# Patient Record
Sex: Female | Born: 1960
Health system: Southern US, Community
[De-identification: ages and names within clinical notes are randomized; demographics above are authoritative.]

## PROBLEM LIST (undated history)

## (undated) DIAGNOSIS — D649 Anemia, unspecified: Secondary | ICD-10-CM

## (undated) DIAGNOSIS — N183 Chronic kidney disease, stage 3 unspecified: Secondary | ICD-10-CM

## (undated) DIAGNOSIS — M169 Osteoarthritis of hip, unspecified: Secondary | ICD-10-CM

## (undated) DIAGNOSIS — D573 Sickle-cell trait: Secondary | ICD-10-CM

## (undated) DIAGNOSIS — D571 Sickle-cell disease without crisis: Secondary | ICD-10-CM

## (undated) DIAGNOSIS — I10 Essential (primary) hypertension: Secondary | ICD-10-CM

## (undated) DIAGNOSIS — E119 Type 2 diabetes mellitus without complications: Secondary | ICD-10-CM

## (undated) DIAGNOSIS — E785 Hyperlipidemia, unspecified: Secondary | ICD-10-CM

## (undated) DIAGNOSIS — Z973 Presence of spectacles and contact lenses: Secondary | ICD-10-CM

## (undated) DIAGNOSIS — N2889 Other specified disorders of kidney and ureter: Secondary | ICD-10-CM

## (undated) DIAGNOSIS — K219 Gastro-esophageal reflux disease without esophagitis: Secondary | ICD-10-CM

## (undated) DIAGNOSIS — E876 Hypokalemia: Secondary | ICD-10-CM

## (undated) DIAGNOSIS — G44209 Tension-type headache, unspecified, not intractable: Secondary | ICD-10-CM

## (undated) DIAGNOSIS — G4700 Insomnia, unspecified: Secondary | ICD-10-CM

## (undated) DIAGNOSIS — R202 Paresthesia of skin: Secondary | ICD-10-CM

## (undated) HISTORY — DX: Chronic kidney disease, stage 3 unspecified: N18.30

## (undated) HISTORY — DX: Hypokalemia: E87.6

## (undated) HISTORY — DX: Hypocalcemia: E83.51

## (undated) HISTORY — PX: CHOLECYSTECTOMY: SHX55

## (undated) HISTORY — DX: Tension-type headache, unspecified, not intractable: G44.209

## (undated) HISTORY — DX: Insomnia, unspecified: G47.00

## (undated) HISTORY — PX: FRACTURE SURGERY: SHX138

## (undated) HISTORY — DX: Sickle-cell trait: D57.3

## (undated) HISTORY — PX: COLONOSCOPY: SHX174

## (undated) HISTORY — PX: TUBAL LIGATION: SHX77

## (undated) HISTORY — DX: Hyperlipidemia, unspecified: E78.5

## (undated) HISTORY — DX: Paresthesia of skin: R20.2

## (undated) HISTORY — DX: Osteoarthritis of hip, unspecified: M16.9

## (undated) HISTORY — DX: Other specified disorders of kidney and ureter: N28.89

## (undated) HISTORY — DX: Chronic kidney disease, stage 3 (moderate): N18.3

## (undated) HISTORY — DX: Sickle-cell disease without crisis: D57.1

---

## 2005-07-05 ENCOUNTER — Emergency Department: Payer: Self-pay | Admitting: Internal Medicine

## 2006-02-15 ENCOUNTER — Emergency Department: Payer: Self-pay | Admitting: Emergency Medicine

## 2008-05-25 DIAGNOSIS — K219 Gastro-esophageal reflux disease without esophagitis: Secondary | ICD-10-CM | POA: Insufficient documentation

## 2008-06-05 ENCOUNTER — Ambulatory Visit: Payer: Self-pay | Admitting: Family Medicine

## 2009-03-12 ENCOUNTER — Emergency Department: Payer: Self-pay | Admitting: Emergency Medicine

## 2009-03-29 ENCOUNTER — Emergency Department: Payer: Self-pay | Admitting: Emergency Medicine

## 2010-05-09 ENCOUNTER — Ambulatory Visit: Payer: Self-pay | Admitting: Family Medicine

## 2010-05-15 ENCOUNTER — Ambulatory Visit: Payer: Self-pay | Admitting: Family Medicine

## 2010-05-19 ENCOUNTER — Ambulatory Visit: Payer: Self-pay | Admitting: Family Medicine

## 2010-05-26 ENCOUNTER — Ambulatory Visit: Payer: Self-pay | Admitting: Family Medicine

## 2010-05-27 DIAGNOSIS — E559 Vitamin D deficiency, unspecified: Secondary | ICD-10-CM | POA: Insufficient documentation

## 2010-05-27 DIAGNOSIS — E785 Hyperlipidemia, unspecified: Secondary | ICD-10-CM | POA: Insufficient documentation

## 2010-06-02 ENCOUNTER — Ambulatory Visit: Payer: Self-pay | Admitting: Family Medicine

## 2010-06-03 ENCOUNTER — Encounter: Payer: Self-pay | Admitting: Family Medicine

## 2010-06-27 ENCOUNTER — Encounter: Payer: Self-pay | Admitting: Family Medicine

## 2011-06-08 ENCOUNTER — Ambulatory Visit: Payer: Self-pay | Admitting: Family Medicine

## 2011-07-30 LAB — HM COLONOSCOPY

## 2011-11-09 ENCOUNTER — Ambulatory Visit: Payer: Self-pay | Admitting: Family Medicine

## 2012-01-12 ENCOUNTER — Emergency Department: Payer: Self-pay | Admitting: Emergency Medicine

## 2012-02-02 ENCOUNTER — Ambulatory Visit: Payer: Self-pay | Admitting: Family Medicine

## 2012-07-05 ENCOUNTER — Ambulatory Visit: Payer: Self-pay | Admitting: Family Medicine

## 2012-07-07 ENCOUNTER — Observation Stay: Payer: Self-pay | Admitting: Internal Medicine

## 2012-07-07 LAB — CBC
HCT: 35.3 % (ref 35.0–47.0)
HGB: 11.3 g/dL — ABNORMAL LOW (ref 12.0–16.0)
MCH: 26.3 pg (ref 26.0–34.0)
MCHC: 32.1 g/dL (ref 32.0–36.0)
MCV: 82 fL (ref 80–100)
Platelet: 178 10*3/uL (ref 150–440)
RBC: 4.32 10*6/uL (ref 3.80–5.20)
RDW: 14.3 % (ref 11.5–14.5)
WBC: 11.7 10*3/uL — ABNORMAL HIGH (ref 3.6–11.0)

## 2012-07-07 LAB — COMPREHENSIVE METABOLIC PANEL
Albumin: 3.8 g/dL (ref 3.4–5.0)
Alkaline Phosphatase: 114 U/L (ref 50–136)
Anion Gap: 8 (ref 7–16)
BUN: 17 mg/dL (ref 7–18)
Bilirubin,Total: 0.3 mg/dL (ref 0.2–1.0)
Calcium, Total: 8.9 mg/dL (ref 8.5–10.1)
Chloride: 108 mmol/L — ABNORMAL HIGH (ref 98–107)
Co2: 29 mmol/L (ref 21–32)
Creatinine: 1.08 mg/dL (ref 0.60–1.30)
EGFR (African American): >60
EGFR (Non-African Amer.): 59 — ABNORMAL LOW
Glucose: 105 mg/dL — ABNORMAL HIGH (ref 65–99)
Osmolality: 291 (ref 275–301)
Potassium: 4 mmol/L (ref 3.5–5.1)
SGOT(AST): 31 U/L (ref 15–37)
SGPT (ALT): 29 U/L
Sodium: 145 mmol/L (ref 136–145)
Total Protein: 8.2 g/dL (ref 6.4–8.2)

## 2012-07-07 LAB — CK TOTAL AND CKMB (NOT AT ARMC)
CK, Total: 224 U/L — ABNORMAL HIGH (ref 21–215)
CK-MB: 1.8 ng/mL (ref 0.5–3.6)

## 2012-07-07 LAB — URINALYSIS, COMPLETE
Bacteria: NONE SEEN
Bilirubin,UR: NEGATIVE
Blood: NEGATIVE
Glucose,UR: NEGATIVE mg/dL (ref 0–75)
Ketone: NEGATIVE
Leukocyte Esterase: NEGATIVE
Nitrite: NEGATIVE
Ph: 5 (ref 4.5–8.0)
Protein: NEGATIVE
RBC,UR: <1 /HPF (ref 0–5)
Specific Gravity: 1.011 (ref 1.003–1.030)
Squamous Epithelial: 1
WBC UR: 3 /HPF (ref 0–5)

## 2012-07-07 LAB — TROPONIN I: Troponin-I: <0.02 ng/mL

## 2012-07-08 DIAGNOSIS — R079 Chest pain, unspecified: Secondary | ICD-10-CM

## 2012-07-08 LAB — CBC WITH DIFFERENTIAL/PLATELET
Basophil #: 0.1 10*3/uL (ref 0.0–0.1)
Basophil %: 0.6 %
Eosinophil #: 0.3 10*3/uL (ref 0.0–0.7)
Eosinophil %: 3 %
HCT: 34.8 % — ABNORMAL LOW (ref 35.0–47.0)
HGB: 11.3 g/dL — ABNORMAL LOW (ref 12.0–16.0)
Lymphocyte #: 3 10*3/uL (ref 1.0–3.6)
Lymphocyte %: 32.4 %
MCH: 27 pg (ref 26.0–34.0)
MCHC: 32.5 g/dL (ref 32.0–36.0)
MCV: 83 fL (ref 80–100)
Monocyte #: 0.5 x10 3/mm (ref 0.2–0.9)
Monocyte %: 5.4 %
Neutrophil #: 5.5 10*3/uL (ref 1.4–6.5)
Neutrophil %: 58.6 %
Platelet: 166 10*3/uL (ref 150–440)
RBC: 4.19 10*6/uL (ref 3.80–5.20)
RDW: 14.4 % (ref 11.5–14.5)
WBC: 9.3 10*3/uL (ref 3.6–11.0)

## 2012-07-08 LAB — BASIC METABOLIC PANEL
Anion Gap: 7 (ref 7–16)
BUN: 14 mg/dL (ref 7–18)
Calcium, Total: 8.6 mg/dL (ref 8.5–10.1)
Chloride: 108 mmol/L — ABNORMAL HIGH (ref 98–107)
Co2: 29 mmol/L (ref 21–32)
Creatinine: 1.05 mg/dL (ref 0.60–1.30)
EGFR (African American): >60
EGFR (Non-African Amer.): >60
Glucose: 90 mg/dL (ref 65–99)
Osmolality: 287 (ref 275–301)
Potassium: 3.8 mmol/L (ref 3.5–5.1)
Sodium: 144 mmol/L (ref 136–145)

## 2012-07-08 LAB — LIPID PANEL
Cholesterol: 147 mg/dL (ref 0–200)
HDL Cholesterol: 46 mg/dL (ref 40–60)
Ldl Cholesterol, Calc: 75 mg/dL (ref 0–100)
Triglycerides: 129 mg/dL (ref 0–200)
VLDL Cholesterol, Calc: 26 mg/dL (ref 5–40)

## 2012-07-08 LAB — URINALYSIS, COMPLETE
Bacteria: NONE SEEN
Bilirubin,UR: NEGATIVE
Blood: NEGATIVE
Glucose,UR: NEGATIVE mg/dL (ref 0–75)
Ketone: NEGATIVE
Leukocyte Esterase: NEGATIVE
Nitrite: NEGATIVE
Ph: 7 (ref 4.5–8.0)
Protein: NEGATIVE
RBC,UR: NONE SEEN /HPF (ref 0–5)
Specific Gravity: 1.02 (ref 1.003–1.030)
Squamous Epithelial: NONE SEEN
WBC UR: 1 /HPF (ref 0–5)

## 2012-07-08 LAB — PROTIME-INR
INR: 1
Prothrombin Time: 13.3 secs (ref 11.5–14.7)

## 2012-07-08 LAB — HEMOGLOBIN A1C: Hemoglobin A1C: 6.1 % (ref 4.2–6.3)

## 2012-11-04 ENCOUNTER — Emergency Department: Payer: Self-pay | Admitting: Emergency Medicine

## 2013-07-26 LAB — HM PAP SMEAR

## 2013-08-09 ENCOUNTER — Ambulatory Visit: Payer: Self-pay | Admitting: Family Medicine

## 2014-06-03 ENCOUNTER — Emergency Department: Payer: Self-pay | Admitting: Emergency Medicine

## 2014-06-06 LAB — BETA STREP CULTURE(ARMC)

## 2014-07-28 ENCOUNTER — Emergency Department: Payer: Self-pay | Admitting: Emergency Medicine

## 2014-09-11 ENCOUNTER — Ambulatory Visit: Payer: Self-pay | Admitting: Family Medicine

## 2014-09-11 LAB — HM MAMMOGRAPHY: HM Mammogram: NORMAL

## 2014-11-05 ENCOUNTER — Ambulatory Visit: Payer: Self-pay | Admitting: Family Medicine

## 2014-12-13 ENCOUNTER — Ambulatory Visit: Payer: Self-pay | Admitting: Nurse Practitioner

## 2015-04-21 NOTE — H&P (Signed)
PATIENT NAME:  Brandy Johnston, Brandy Johnston MR#:  [id number] DATE OF BIRTH:  1961/10/18  DATE OF ADMISSION:  07/07/2012  ADMITTING PHYSICIAN: Dr. Enid Baasadhika Lakeishia Truluck. PRIMARY CARE PHYSICIAN: Dr. Alba CoryKrichna Sowles.   CHIEF COMPLAINT: Presyncope and also chest pain.   HISTORY OF PRESENT ILLNESS: Brandy Johnston is a 54 year old pleasant African American female with past medical history significant for diabetes, hypertension, and gastroesophageal reflux disease who presents to the Emergency Room secondary to sudden onset of chest tightness with diaphoresis and also presyncopal episode while at work today. The patient says she has not been sick lately. She was fine when she woke up this morning, went to work. She works at [employer] in [employer] and was kind of stressed out today because her two backup persons were on leave and she had to do most of the work. She was fine up until lunch break when she all of a sudden felt diaphoretic and had some chest tightness with breathing difficulty and also dizziness. She felt her voice was hoarse for a few minutes so had to call the on-call person who checked vitals and her blood pressure was 198/153. They asked her to lie down and rechecked vitals after three minutes and the blood pressure was still up. She was dropped home and then she called her PCP who asked her to come to the ED. In the Emergency Room the patient still has some chest pain. Blood pressure is 160/86 at this time. Her Johnston-dimer was elevated on labs at greater than 6 but CT of the chest is negative for PE. So she is being admitted under observation for her chest pain and presyncope with her risk factors.   PAST MEDICAL HISTORY:  1. Hypertension.  2. Diabetes mellitus.  3. Acid reflux disease. 4. Left leg neuropathy.   PAST SURGICAL HISTORY:  1. Left wrist surgery after a sports injury.  2. Cholecystectomy.  3. Tubal ligation.   ALLERGIES TO MEDICATIONS: No known drug allergies.   HOME MEDICATION: She  says she takes seven medications at home but does not know their names. By calling her pharmacy we were able to get only five medications.  1. Aspirin 81 mg p.o. daily.  2. Enalapril 20 mg p.o. daily. 3. Janumet 100 mg/1000 mg tablet, one tablet p.o. daily.  4. Meloxicam 15 mg p.o. as needed for pain.  5. Omeprazole 20 mg p.o. daily.  6. Apart from these, she states that she also takes one medication for cholesterol and also neuropathic medication, possibly gabapentin, but unknown doses.   SOCIAL HISTORY: Lives at home by herself. Works in [employer] as mentioned above. No history of any smoking. Very, very rare occasional alcohol use.   FAMILY HISTORY: Does not know anything about her dad. Aunt with breast cancer and mom with diabetes and hypertension.   REVIEW OF SYSTEMS: CONSTITUTIONAL: No fever, fatigue, or weakness. EYES: No blurred vision, double vision, glaucoma or cataracts. ENT: No tinnitus, ear pain, hearing loss, epistaxis, or discharge. RESPIRATORY: No cough, wheeze, hemoptysis, or chronic obstructive pulmonary disease. CARDIOVASCULAR: Positive for chest pain. No orthopnea, edema, arrhythmia, or palpitations. Positive for presyncope. GASTROINTESTINAL: No nausea, vomiting, diarrhea, abdominal pain, hematemesis, or melena. The patient actually does complain of occasional diarrhea secondary to metformin. GENITOURINARY: No dysuria, hematuria, renal calculus, frequency, or incontinence. ENDOCRINE: No polyuria, nocturia, thyroid problems, heat or cold intolerance. HEMATOLOGY: No anemia, easy bruising or bleeding. SKIN: No acne, rash, or lesions. MUSCULOSKELETAL: No neck, back, shoulder pain, arthritis, or gout. NEUROLOGIC:  No numbness, weakness, cerebrovascular accident, transient ischemic attack, or seizures. PSYCHOLOGICAL: No anxiety, insomnia, or depression.   PHYSICAL EXAMINATION:  VITAL SIGNS: Temperature 98.7 degrees Fahrenheit, pulse 89, respirations 18, blood pressure  160/86, pulse oximetry 97% on room air.   GENERAL: Well built, well nourished female lying in bed, not in any acute distress.   HEENT: Normocephalic, atraumatic. Pupils equal, round, reacting to light. Anicteric sclerae. Extraocular movements intact. Oropharynx clear without erythema, mass, or exudates.   NECK: Supple. No thyromegaly, jugular venous distention or carotid bruits. No lymphadenopathy.   LUNGS: Clear to auscultation bilaterally. No wheeze or crackles. No use of accessory muscles for breathing.   CARDIOVASCULAR: S1, S2. Regular rate and rhythm. No murmurs, rubs, or gallops.   ABDOMEN: Soft, nontender, nondistended. No hepatosplenomegaly. Normal bowel sounds.   EXTREMITIES: No pedal edema. No clubbing or cyanosis. 2+ dorsalis pulses palpable bilaterally.   SKIN: No acne, rash, or lesions.   LYMPHATICS: No cervical or inguinal lymphadenopathy.   NEUROLOGIC: Cranial nerves intact. No focal motor or sensory deficits.   PSYCHOLOGICAL: The patient is awake, alert, oriented x3.   LABORATORY, RADIOLOGICAL AND DIAGNOSTIC DATA: WBC 11.7, hemoglobin 11.3, hematocrit 35.2, platelet count 178. Sodium 145, potassium 4.0, chloride 108, bicarbonate 29, BUN 17, creatinine 1.08, glucose 105, calcium 8.9. ALT 29, AST 31, alkaline phosphatase 114, total bilirubin 0.3, albumin 3.8. First set of CK 224, CK MB 1.8 and troponin less than 0.02. Urinalysis negative for any infection. Johnston-dimer is greater than 6. Chest x-ray showing no acute cardiopulmonary abnormality and CT of the chest with contrast showing no evidence of any pulmonary embolus. Heart is normal. No pericardial effusion. The lungs are clear. No adenopathy. No consolidation, effusion, or pneumothorax. EKG showing normal sinus rhythm, heart rate of 77.   ASSESSMENT AND PLAN: This is a 54 year old female with diabetes, hypertension, and reflux disease admitted for presyncopal symptoms and chest pain. Johnston-dimer seems to be elevated, but CT of  chest is negative for PE.  1. Chest pain with presyncope. Blood pressure elevated when this happened, not sure if this is stress related or angina. However, she has a risk factor for unstable angina because of her medical problems. CT of the chest negative for PE so we will admit under observation. Recycle cardiac enzymes and Myoview in the morning. Continue aspirin at this time. Also, we will get lower extremity Doppler's to rule out deep venous thrombosis with her elevated Johnston-dimer. No other source could be identified at this time.  2. Accelerated hypertension. She is on the enalapril at home. Currently blood pressure better controlled, so continue that and add hydralazine IV p.r.n.  3. Diabetes mellitus. Hold metformin as she received IV contrast for CT of the chest, but continue Januvia and sliding scale insulin.  4. Gastroesophageal reflux disease. Continue Prilosec.  5. Neuropathic pain. The patient is on gabapentin and meloxicam with unknown doses so hold off for now. Continue Tylenol for pain if needed. 6. CODE STATUS: FULL CODE.   TIME SPENT ON ADMISSION: 50 minutes.   ____________________________ Enid Baas, MD rk:ap Johnston: 07/07/2012 22:44:35 ET T: 07/08/2012 06:53:29 ET JOB#: [id number]  cc: Enid Baas, MD, <Dictator> Onnie Boer. Carlynn Purl, MD Enid Baas MD ELECTRONICALLY SIGNED 07/08/2012 15:49

## 2015-04-21 NOTE — Discharge Summary (Signed)
PATIENT NAME:  Brandy Johnston, Brandy Johnston MR#:  [id number] DATE OF BIRTH:  12/22/1961  DATE OF ADMISSION:  07/07/2012 DATE OF DISCHARGE:  07/08/2012  ADMISSION DIAGNOSES:  1. Chest pain.  2. Hypertension.  3. History of diabetes.  4. Presyncope.   CONSULTS: None.   LABORATORY DATA: Troponin x3 were negative. Sodium 144, potassium 3.8, chloride 108, bicarb 29, BUN 14, creatinine 1.05, glucose 90, white blood cells 9.3, hemoglobin 11.3, hematocrit 35, platelets 166. INR 1.0.   Stress test was negative for acute ischemia.   HOSPITAL COURSE: The patient is a 54 year old female who was brought in for presyncope, atypical chest pain, and hypertension. For further details, please refer to the history and physical.  1. Chest pain. The patient underwent a Myoview which essentially was normal showing no evidence of ischemia. Her telemetry was normal. Cardiac enzymes were normal.  2. Hypertension. The patient still had some slightly elevated blood pressure. We think some of this was partly due to some anxiety. Her PCP recently increased her ACE inhibitor to 20 mg which she will continue and have follow-up with her outpatient physician regarding her blood pressure. We did ask her to go to the pharmacy and check her blood pressure daily until her follow-up appointment with her PCP.  3. Presyncope secondary to hypertension.  4. Gastroesophageal reflux disease. The patient will resume her PPI.  5. Diabetes. The patient will continue ADA diet and Januvia.   DISCHARGE MEDICATIONS:  1. Enalapril 20 mg daily.  2. Meloxicam 15 mg daily.  3. Januvia/metformin 100/1000 mg daily.  4. Omeprazole 20 mg daily.  5. Aspirin 81 mg daily.   DISCHARGE DIET: Low sodium carbohydrate controlled diet.   DISCHARGE ACTIVITY: As tolerated.   DISCHARGE FOLLOW-UP: The patient will follow-up with Dr. Carlynn PurlSowles in 1 to 2 days for her blood pressure.    TIME SPENT: Approximately 35 minutes.   ____________________________ Janyth ContesSital P. Juliene PinaMody,  MD spm:drc Johnston: 07/09/2012 11:50:00 ET T: 07/09/2012 12:00:41 ET JOB#: [id number]  cc: Donabelle Molden P. Juliene PinaMody, MD, <Dictator> Onnie BoerKrichna F. Carlynn PurlSowles, MD Janyth ContesSITAL P Jasmia Angst MD ELECTRONICALLY SIGNED 07/09/2012 12:15

## 2015-05-10 LAB — LIPID PANEL
Cholesterol: 138 mg/dL (ref 0–200)
HDL: 45 mg/dL (ref 35–70)
LDL Cholesterol: 73 mg/dL
Triglycerides: 101 mg/dL (ref 40–160)

## 2015-05-10 LAB — HEMOGLOBIN A1C: Hgb A1c MFr Bld: 6.7 % — AB (ref 4.0–6.0)

## 2015-05-27 ENCOUNTER — Other Ambulatory Visit: Payer: Self-pay

## 2015-05-27 ENCOUNTER — Encounter: Payer: Self-pay | Admitting: Emergency Medicine

## 2015-05-27 ENCOUNTER — Emergency Department
Admission: EM | Admit: 2015-05-27 | Discharge: 2015-05-27 | Disposition: A | Discharge status: Home / Self Care | Payer: BLUE CROSS/BLUE SHIELD | Attending: Emergency Medicine | Admitting: Emergency Medicine

## 2015-05-27 DIAGNOSIS — I10 Essential (primary) hypertension: Secondary | ICD-10-CM | POA: Insufficient documentation

## 2015-05-27 DIAGNOSIS — R55 Syncope and collapse: Secondary | ICD-10-CM | POA: Diagnosis not present

## 2015-05-27 DIAGNOSIS — E119 Type 2 diabetes mellitus without complications: Secondary | ICD-10-CM | POA: Diagnosis not present

## 2015-05-27 HISTORY — DX: Essential (primary) hypertension: I10

## 2015-05-27 HISTORY — DX: Type 2 diabetes mellitus without complications: E11.9

## 2015-05-27 LAB — URINALYSIS COMPLETE WITH MICROSCOPIC (ARMC ONLY)
Bilirubin Urine: NEGATIVE
Glucose, UA: NEGATIVE mg/dL
Hgb urine dipstick: NEGATIVE
Ketones, ur: NEGATIVE mg/dL
Leukocytes, UA: NEGATIVE
Nitrite: NEGATIVE
Protein, ur: NEGATIVE mg/dL
Specific Gravity, Urine: 1.013 (ref 1.005–1.030)
pH: 5 (ref 5.0–8.0)

## 2015-05-27 LAB — BASIC METABOLIC PANEL
Anion gap: 9 (ref 5–15)
BUN: 18 mg/dL (ref 6–20)
CO2: 26 mmol/L (ref 22–32)
Calcium: 8.9 mg/dL (ref 8.9–10.3)
Chloride: 106 mmol/L (ref 101–111)
Creatinine, Ser: 1.29 mg/dL — ABNORMAL HIGH (ref 0.44–1.00)
GFR calc Af Amer: 53 mL/min — ABNORMAL LOW (ref >60)
GFR calc non Af Amer: 46 mL/min — ABNORMAL LOW (ref >60)
Glucose, Bld: 102 mg/dL — ABNORMAL HIGH (ref 65–99)
Potassium: 3.4 mmol/L — ABNORMAL LOW (ref 3.5–5.1)
Sodium: 141 mmol/L (ref 135–145)

## 2015-05-27 LAB — CBC WITH DIFFERENTIAL/PLATELET
Basophils Absolute: 0.1 10*3/uL (ref 0–0.1)
Basophils Relative: 1 %
Eosinophils Absolute: 0.2 10*3/uL (ref 0–0.7)
Eosinophils Relative: 2 %
HCT: 33.3 % — ABNORMAL LOW (ref 35.0–47.0)
Hemoglobin: 10.9 g/dL — ABNORMAL LOW (ref 12.0–16.0)
Lymphocytes Relative: 28 %
Lymphs Abs: 2.7 10*3/uL (ref 1.0–3.6)
MCH: 26.6 pg (ref 26.0–34.0)
MCHC: 32.7 g/dL (ref 32.0–36.0)
MCV: 81.2 fL (ref 80.0–100.0)
Monocytes Absolute: 0.5 10*3/uL (ref 0.2–0.9)
Monocytes Relative: 5 %
Neutro Abs: 6.3 10*3/uL (ref 1.4–6.5)
Neutrophils Relative %: 64 %
Platelets: 226 10*3/uL (ref 150–440)
RBC: 4.1 MIL/uL (ref 3.80–5.20)
RDW: 15.1 % — ABNORMAL HIGH (ref 11.5–14.5)
WBC: 9.8 10*3/uL (ref 3.6–11.0)

## 2015-05-27 NOTE — ED Provider Notes (Signed)
[facility] Emergency Department Provider Note   ____________________________________________  Time seen: 2 PM I have reviewed the triage vital signs and the triage nursing note.  HISTORY  Chief Complaint Near Syncope   Historian  Patient   HPI Brandy Johnston is a 54 y.o. 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 It is somewhat exertional. She was feeling lightheaded and dizzy and then nearly passed out. She had no palpitations, shortness of breath, weakness or numbness, or headache. She has not been ill recently. She did land on her right side and feels a sore shoulder and sore wrist but does not believe them to be broken. She's never passed out before. She does follow with primary care doctor and has not seen a cardiologist in the past. She does report that in the past she's had some exertional chest pain but not today or with this episode.    Past Medical History  Diagnosis Date  . Hypertension   . Diabetes mellitus without complication     There are no active problems to display for this patient.   Past Surgical History  Procedure Laterality Date  . Cholecystectomy    . Tubal ligation      No current outpatient prescriptions on file. She does take a blood pressure pill  Allergies Review of patient's allergies indicates no known allergies.  History reviewed. No pertinent family history. social history: gay  Social History History  Substance Use Topics  . Smoking status: Never Smoker   . Smokeless tobacco: Not on file  . Alcohol Use: No    Review of Systems  Constitutional: Negative for fever. Eyes: Negative for visual changes. ENT: Negative for sore throat. Cardiovascular: Negative for palpitations Respiratory: Negative for shortness of breath. Gastrointestinal: Negative for abdominal pain, vomiting and diarrhea. Genitourinary: Negative for dysuria. Musculoskeletal: Negative for  back pain. Skin: Negative for rash. Neurological: Negative for headaches, focal weakness or numbness.  ____________________________________________   PHYSICAL EXAM:  VITAL SIGNS: ED Triage Vitals  Enc Vitals Group     BP 05/27/15 1113 136/70 mmHg     Pulse Rate 05/27/15 1113 80     Resp 05/27/15 1113 18     Temp 05/27/15 1113 98.7 F (37.1 C)     Temp Source 05/27/15 1113 Oral     SpO2 05/27/15 1113 98 %     Weight 05/27/15 1113 186 lb (84.369 kg)     Height 05/27/15 1113 5\' 4"  (1.626 m)     Head Cir --      Peak Flow --      Pain Score 05/27/15 1114 7     Pain Loc --      Pain Edu? --      Excl. in GC? --      Constitutional: Alert and oriented. Well appearing and in no distress. Eyes: Conjunctivae are normal. PERRL. Normal extraocular movements. ENT   Head: Normocephalic and atraumatic.   Nose: No congestion/rhinnorhea.   Mouth/Throat: Mucous membranes are moist.   Neck: No stridor. Cardiovascular: Normal rate, regular rhythm.  No murmurs, rubs, or gallops. Respiratory: Normal respiratory effort without tachypnea nor retractions. Breath sounds are clear and equal bilaterally. No wheezes/rales/rhonchi. Gastrointestinal: Soft and nontender. No distention.  Genitourinary: Musculoskeletal: Mildly tender anterior right shoulder margin with range of motion, but no bony point tenderness. No swelling about the elbow or wrist. Normal right elbow. Mild tenderness with range of motion of the  right wrist but no bony point tenderness. Neurologic:  Normal speech and language. No gross focal neurologic deficits are appreciated. Skin:  Skin is warm, dry and intact. No rash noted. Psychiatric: Mood and affect are normal. Speech and behavior are normal. Patient exhibits appropriate insight and judgment.  ____________________________________________   EKG  I, Governor Rooks, MD, the attending physician have personally viewed and interpreted this ECG.   79 bpm normal  sinus rhythm. Normal axis. Normal QRS. Nonspecific T-wave flattening laterally. QTC 424. No evidence of Wolff-Parkinson-White or Brugada ____________________________________________  LABS (pertinent positives/negatives)  White blood cell count normal, hemoglobin 10.9 Metabolic panel showing a BUN of 18 with a creatinine 1.29 and a potassium of 3.4 other elect lites within normal limits. Urinalysis negative ____________________________________________  RADIOLOGY Radiologist results reviewed  None __________________________________________  PROCEDURES  Procedure(s) performed: None Critical Care performed: None  ____________________________________________   ED COURSE / ASSESSMENT AND PLAN  Pertinent labs & imaging results that were available during my care of the patient were reviewed by me and considered in my medical decision making (see chart for details).   Patient is overall well-appearing with stable vital signs. No high-risk red flags on her history, physical exam or laboratory evaluation. Her BUN/creatinine are slightly elevated raising possibly of some level of dehydration. She is not having any IV fluids and I asked her to just hydrate tonight. No chance of pregnancy per patient with tubal and patient is gay. In terms of trauma, I do not suspect any broken bones in neither does the patient. It seemed to reason for imaging.   I suspect dehydration/vasovagal event causing her near syncopal episode. I do not suspect an acute cardiac cause nor a central brain related cause.  Due the patient's age and her complaint of exertional chest pains at times under review of systems, I did refer her to see a cardiologist. Return depressions and discharge instructions were provided to patient and she understands and is comfortable with this plan.   ___________________________________________   FINAL CLINICAL IMPRESSION(S) / ED DIAGNOSES   Final diagnoses:  Near syncope       Governor Rooks, MD 05/27/15 1423

## 2015-05-27 NOTE — ED Notes (Addendum)
Pt states that she got light-headed at work and blanked out. Pt is testing and playing on facebook during triage. She is complaining of right wrist and right knee pain. She drove herself here.

## 2015-05-27 NOTE — Discharge Instructions (Signed)
Near-Syncope Near-syncope (commonly known as near fainting) is sudden weakness, dizziness, or feeling like you might pass out. During an episode of near-syncope, you may also develop pale skin, have tunnel vision, or feel sick to your stomach (nauseous). Near-syncope may occur when getting up after sitting or while standing for a long time. It is caused by a sudden decrease in blood flow to the brain. This decrease can result from various causes or triggers, most of which are not serious. However, because near-syncope can sometimes be a sign of something serious, a medical evaluation is required. The specific cause is often not determined. HOME CARE INSTRUCTIONS  Monitor your condition for any changes. The following actions may help to alleviate any discomfort you are experiencing:  Have someone stay with you until you feel stable.  Lie down right away and prop your feet up if you start feeling like you might faint. Breathe deeply and steadily. Wait until all the symptoms have passed. Most of these episodes last only a few minutes. You may feel tired for several hours.   Drink enough fluids to keep your urine clear or pale yellow.   If you are taking blood pressure or heart medicine, get up slowly when seated or lying down. Take several minutes to sit and then stand. This can reduce dizziness.  Follow up with your health care provider as directed. SEEK IMMEDIATE MEDICAL CARE IF:   You have a severe headache.   You have unusual pain in the chest, abdomen, or back.   You are bleeding from the mouth or rectum, or you have black or tarry stool.   You have an irregular or very fast heartbeat.   You have repeated fainting or have seizure-like jerking during an episode.   You faint when sitting or lying down.   You have confusion.   You have difficulty walking.   You have severe weakness.   You have vision problems.  MAKE SURE YOU:   Understand these instructions.  Will  watch your condition.  Will get help right away if you are not doing well or get worse. Document Released: 12/14/2005 Document Revised: 12/19/2013 Document Reviewed: 05/19/2013 Exit[employer] Patient Information 2015 [employer]. This information is not intended to replace advice given to you by your health care provider. Make sure you discuss any questions you have with your health care provider.  

## 2015-06-14 ENCOUNTER — Telehealth: Payer: Self-pay | Admitting: Family Medicine

## 2015-06-14 NOTE — Telephone Encounter (Signed)
Pt says she called in to schedule an appointment for Monday or Tuesday for ER follow-up, but nothing is available. Is it possible to work her in sometime next week? Or what is the soonest we can work her in? She is only off on certain days. It is okay to leave a detailed message on her cell [phone]

## 2015-06-17 NOTE — Telephone Encounter (Signed)
Dr. Carlynn Purl approved 11:45 a.m. Tomorrow on 06/18/15

## 2015-06-17 NOTE — Telephone Encounter (Signed)
Where should she placed on the schedule for tomorrow?

## 2015-06-18 ENCOUNTER — Encounter: Payer: Self-pay | Admitting: Family Medicine

## 2015-06-18 ENCOUNTER — Ambulatory Visit (INDEPENDENT_AMBULATORY_CARE_PROVIDER_SITE_OTHER): Payer: BLUE CROSS/BLUE SHIELD | Admitting: Family Medicine

## 2015-06-18 VITALS — BP 116/66 | HR 72 | Temp 97.8°F | Resp 18 | Ht 63.75 in | Wt 182.7 lb

## 2015-06-18 DIAGNOSIS — E1129 Type 2 diabetes mellitus with other diabetic kidney complication: Secondary | ICD-10-CM | POA: Insufficient documentation

## 2015-06-18 DIAGNOSIS — E669 Obesity, unspecified: Secondary | ICD-10-CM | POA: Insufficient documentation

## 2015-06-18 DIAGNOSIS — I10 Essential (primary) hypertension: Secondary | ICD-10-CM

## 2015-06-18 DIAGNOSIS — N183 Chronic kidney disease, stage 3 unspecified: Secondary | ICD-10-CM | POA: Insufficient documentation

## 2015-06-18 DIAGNOSIS — D573 Sickle-cell trait: Secondary | ICD-10-CM | POA: Insufficient documentation

## 2015-06-18 DIAGNOSIS — M169 Osteoarthritis of hip, unspecified: Secondary | ICD-10-CM | POA: Insufficient documentation

## 2015-06-18 DIAGNOSIS — E66811 Obesity, class 1: Secondary | ICD-10-CM | POA: Insufficient documentation

## 2015-06-18 DIAGNOSIS — R252 Cramp and spasm: Secondary | ICD-10-CM | POA: Insufficient documentation

## 2015-06-18 DIAGNOSIS — R55 Syncope and collapse: Secondary | ICD-10-CM | POA: Diagnosis not present

## 2015-06-18 DIAGNOSIS — N1832 Chronic kidney disease, stage 3b: Secondary | ICD-10-CM | POA: Insufficient documentation

## 2015-06-18 DIAGNOSIS — R748 Abnormal levels of other serum enzymes: Secondary | ICD-10-CM | POA: Insufficient documentation

## 2015-06-18 DIAGNOSIS — M5442 Lumbago with sciatica, left side: Secondary | ICD-10-CM | POA: Insufficient documentation

## 2015-06-18 NOTE — Progress Notes (Signed)
Name: Brandy Johnston   MRN: [id number]    DOB: 07/06/1961   Date:06/18/2015       Progress Note  Subjective  Chief Complaint  Chief Complaint  Patient presents with  . Hospitalization Follow-up    Syncopy Episode on 05/27/2015 told the patient she was dehydrated, high BP and sugar dropped. Fell on left shoulder and wrist and had some swelling, but exam showed no broken bones. Blood work-normal.    HPI  Near Syncope episode: happened at work , she work in a [employer] but she was not in a hot environment at the time. She had breakfast the morning of the incident, but she had not drank enough fluids that day. She felt lightheaded , she stumbled and fell, she recalls the incident and caught herself with her right hand. She denies seizure activity , no bladder or bowel incontinence, no palpitation or chest pain.  Denies mental fogginess following the episode. She went by private vehicle to the [facility], labs were done, EKG and labs were within normal lipids, except for slightly low potassium at 3.4, and mild anemia - that is chronic.  She states her shoulder was sore but she is back to normal now. No other episodes since May 31st,2016  Patient Active Problem List   Diagnosis Date Noted  . Benign essential HTN 06/18/2015  . Chronic kidney disease (CKD), stage III (moderate) 06/18/2015  . Cramps of lower extremity 06/18/2015  . Diabetes 06/18/2015  . Abnormal serum level of alkaline phosphatase 06/18/2015  . Neuritis or radiculitis due to rupture of lumbar intervertebral disc 06/18/2015  . Adiposity 06/18/2015  . Degenerative arthritis of hip 06/18/2015  . Sickle cell trait 06/18/2015  . Dyslipidemia 05/27/2010  . Avitaminosis D 05/27/2010  . Gastro-esophageal reflux disease without esophagitis 05/25/2008    History  Substance Use Topics  . Smoking status: Never Smoker   . Smokeless tobacco: Never Used  . Alcohol Use: No     Current outpatient prescriptions:  .  amLODipine-valsartan (EXFORGE)  5-160 MG per tablet, Take 1 tablet by mouth daily., Disp: , Rfl: 0 .  aspirin 81 MG chewable tablet, Chew 1 tablet by mouth as needed., Disp: , Rfl:  .  atorvastatin (LIPITOR) 40 MG tablet, Take 1 tablet by mouth daily., Disp: , Rfl: 0 .  Butalbital-APAP-Caffeine 50-300-40 MG CAPS, Take 1 tablet by mouth as needed., Disp: , Rfl: 0 .  gabapentin (NEURONTIN) 300 MG capsule, Take 1 capsule by mouth 3 (three) times daily., Disp: , Rfl: 0 .  GLUCOSE BLOOD VI, , Disp: , Rfl:  .  magnesium oxide (MAG-OX) 400 MG tablet, Take 1 tablet by mouth 2 (two) times daily., Disp: , Rfl:  .  metFORMIN (GLUCOPHAGE) 850 MG tablet, Take 1 tablet by mouth daily., Disp: , Rfl: 0 .  omeprazole (PRILOSEC) 20 MG capsule, Take 1 capsule by mouth daily., Disp: , Rfl: 0 .  temazepam (RESTORIL) 15 MG capsule, Take 1 capsule by mouth as needed., Disp: , Rfl: 0 .  traMADol (ULTRAM) 50 MG tablet, Take 1 tablet by mouth as needed. For pain, Disp: , Rfl: 0 .  Vitamin D, Ergocalciferol, (DRISDOL) 50000 UNITS CAPS capsule, Take 1 capsule by mouth once a week., Disp: , Rfl: 0  No Known Allergies  ROS  Constitutional: Negative for fever or weight change.  Respiratory: Negative for cough and shortness of breath.   Cardiovascular: Negative for chest pain or palpitations.  Gastrointestinal: Negative for abdominal pain, no bowel changes.  Musculoskeletal:  Negative for gait problem or joint swelling.  Skin: Negative for rash.  Neurological: Negative for  headache. Dizziness resolved No other specific complaints in a complete review of systems (except as listed in HPI above).  Objective  Filed Vitals:   06/18/15 1223  BP: 116/66  Pulse: 72  Temp: 97.8 F (36.6 C)  TempSrc: Oral  Resp: 18  Height: 5' 3.75" (1.619 m)  Weight: 182 lb 11.2 oz (82.872 kg)  SpO2: 97%    Body mass index is 31.62 kg/(m^2).    Physical Exam  Constitutional: Patient appears well-developed and well-nourished. No distress.  Eyes:  No scleral  icterus.  Neck: Normal range of motion. Neck supple. Cardiovascular: Normal rate, regular rhythm and normal heart sounds.  No murmur heard. No BLE edema. Pulmonary/Chest: Effort normal and breath sounds normal. No respiratory distress. Abdominal: Soft.  There is no tenderness. Psychiatric: Patient has a normal mood and affect. behavior is normal. Judgment and thought content normal.  Recent Results (from the past 2160 hour(s))  Lipid panel     Status: None   Collection Time: 05/10/15 12:00 AM  Result Value Ref Range   Triglycerides 101 40 - 160 mg/dL   Cholesterol 138 0 - 200 mg/dL   HDL 45 35 - 70 mg/dL   LDL Cholesterol 73 mg/dL  Hemoglobin A1c     Status: Abnormal   Collection Time: 05/10/15 12:00 AM  Result Value Ref Range   Hgb A1c MFr Bld 6.7 (A) 4.0 - 6.0 %  CBC with Differential/Platelet     Status: Abnormal   Collection Time: 05/27/15 11:19 AM  Result Value Ref Range   WBC 9.8 3.6 - 11.0 K/uL   RBC 4.10 3.80 - 5.20 MIL/uL   Hemoglobin 10.9 (L) 12.0 - 16.0 g/dL   HCT 33.3 (L) 35.0 - 47.0 %   MCV 81.2 80.0 - 100.0 fL   MCH 26.6 26.0 - 34.0 pg   MCHC 32.7 32.0 - 36.0 g/dL   RDW 15.1 (H) 11.5 - 14.5 %   Platelets 226 150 - 440 K/uL   Neutrophils Relative % 64 %   Neutro Abs 6.3 1.4 - 6.5 K/uL   Lymphocytes Relative 28 %   Lymphs Abs 2.7 1.0 - 3.6 K/uL   Monocytes Relative 5 %   Monocytes Absolute 0.5 0.2 - 0.9 K/uL   Eosinophils Relative 2 %   Eosinophils Absolute 0.2 0 - 0.7 K/uL   Basophils Relative 1 %   Basophils Absolute 0.1 0 - 0.1 K/uL  Basic metabolic panel     Status: Abnormal   Collection Time: 05/27/15 11:19 AM  Result Value Ref Range   Sodium 141 135 - 145 mmol/L   Potassium 3.4 (L) 3.5 - 5.1 mmol/L   Chloride 106 101 - 111 mmol/L   CO2 26 22 - 32 mmol/L   Glucose, Bld 102 (H) 65 - 99 mg/dL   BUN 18 6 - 20 mg/dL   Creatinine, Ser 1.29 (H) 0.44 - 1.00 mg/dL   Calcium 8.9 8.9 - 10.3 mg/dL   GFR calc non Af Amer 46 (L) >60 mL/min   GFR calc Af Amer  53 (L) >60 mL/min    Comment: (NOTE) The eGFR has been calculated using the CKD EPI equation. This calculation has not been validated in all clinical situations. eGFR's persistently <60 mL/min signify possible Chronic Kidney Disease.    Anion gap 9 5 - 15  Urinalysis complete, with microscopic [facility])     Status: Abnormal  Collection Time: 05/27/15 11:19 AM  Result Value Ref Range   Color, Urine YELLOW (A) YELLOW   APPearance CLEAR (A) CLEAR   Glucose, UA NEGATIVE NEGATIVE mg/dL   Bilirubin Urine NEGATIVE NEGATIVE   Ketones, ur NEGATIVE NEGATIVE mg/dL   Specific Gravity, Urine 1.013 1.005 - 1.030   Hgb urine dipstick NEGATIVE NEGATIVE   pH 5.0 5.0 - 8.0   Protein, ur NEGATIVE NEGATIVE mg/dL   Nitrite NEGATIVE NEGATIVE   Leukocytes, UA NEGATIVE NEGATIVE   RBC / HPF 0-5 0 - 5 RBC/hpf   WBC, UA 0-5 0 - 5 WBC/hpf   Bacteria, UA RARE (A) NONE SEEN   Squamous Epithelial / LPF 0-5 (A) NONE SEEN   Hyaline Casts, UA PRESENT      Assessment & Plan  1. Near syncope Asymptomatic since initial event. Discussed possible causes, at this time we will not refer her for any further studies unless she has another episode. Stay hydrated and monitor bp at home if low notify me so we can adjust dose of medication.    2. Benign essential HTN Monitor bp at work, and if stays low she will call me back

## 2015-06-27 ENCOUNTER — Ambulatory Visit: Payer: Self-pay | Admitting: Family Medicine

## 2015-07-29 ENCOUNTER — Other Ambulatory Visit: Payer: Self-pay | Admitting: Family Medicine

## 2015-07-29 DIAGNOSIS — G44221 Chronic tension-type headache, intractable: Secondary | ICD-10-CM

## 2015-07-29 NOTE — Telephone Encounter (Signed)
Patient called stating she is at the pharmacy now and having a migraine and is asking we send her refill while she is waiting.

## 2015-07-29 NOTE — Telephone Encounter (Signed)
Patient requesting refill. 

## 2015-08-06 ENCOUNTER — Other Ambulatory Visit: Payer: Self-pay | Admitting: Family Medicine

## 2015-08-06 NOTE — Telephone Encounter (Signed)
Patient requesting refill. 

## 2015-08-16 ENCOUNTER — Ambulatory Visit (INDEPENDENT_AMBULATORY_CARE_PROVIDER_SITE_OTHER): Payer: BLUE CROSS/BLUE SHIELD | Admitting: Family Medicine

## 2015-08-16 ENCOUNTER — Encounter: Payer: Self-pay | Admitting: Family Medicine

## 2015-08-16 ENCOUNTER — Other Ambulatory Visit: Payer: Self-pay

## 2015-08-16 VITALS — BP 122/74 | HR 70 | Temp 98.4°F | Resp 14 | Ht 64.0 in | Wt 182.8 lb

## 2015-08-16 DIAGNOSIS — Z23 Encounter for immunization: Secondary | ICD-10-CM

## 2015-08-16 DIAGNOSIS — Z01419 Encounter for gynecological examination (general) (routine) without abnormal findings: Secondary | ICD-10-CM

## 2015-08-16 DIAGNOSIS — Z1239 Encounter for other screening for malignant neoplasm of breast: Secondary | ICD-10-CM

## 2015-08-16 DIAGNOSIS — Z1211 Encounter for screening for malignant neoplasm of colon: Secondary | ICD-10-CM

## 2015-08-16 DIAGNOSIS — Z124 Encounter for screening for malignant neoplasm of cervix: Secondary | ICD-10-CM | POA: Diagnosis not present

## 2015-08-16 DIAGNOSIS — Z7189 Other specified counseling: Secondary | ICD-10-CM | POA: Diagnosis not present

## 2015-08-16 DIAGNOSIS — Z0000 Encounter for general adult medical examination without abnormal findings: Secondary | ICD-10-CM

## 2015-08-16 DIAGNOSIS — G44221 Chronic tension-type headache, intractable: Secondary | ICD-10-CM

## 2015-08-16 DIAGNOSIS — Z719 Counseling, unspecified: Secondary | ICD-10-CM

## 2015-08-16 NOTE — Progress Notes (Signed)
Name: Brandy Johnston   MRN: [id number]    DOB: Jan 15, 1961   Date:08/16/2015       Progress Note  Subjective  Chief Complaint  Chief Complaint  Patient presents with  . Annual Exam    HPI  Well woman: continues to have body aches from a physical job, also has meralgia paresthetica - chronic, and radiculitis but is not taking gabapentin three times daily.    Patient Active Problem List   Diagnosis Date Noted  . Benign essential HTN 06/18/2015  . Chronic kidney disease (CKD), stage III (moderate) 06/18/2015  . Cramps of lower extremity 06/18/2015  . Diabetes mellitus with renal manifestation 06/18/2015  . Abnormal serum level of alkaline phosphatase 06/18/2015  . Neuritis or radiculitis due to rupture of lumbar intervertebral disc 06/18/2015  . Obesity (BMI 30.0-34.9) 06/18/2015  . Degenerative arthritis of hip 06/18/2015  . Sickle cell trait 06/18/2015  . Dyslipidemia 05/27/2010  . Vitamin D deficiency 05/27/2010  . Gastro-esophageal reflux disease without esophagitis 05/25/2008    Past Surgical History  Procedure Laterality Date  . Cholecystectomy    . Tubal ligation    . Fracture surgery Left     cast and pins   . Colonoscopy      Family History  Problem Relation Age of Onset  . Migraines Mother   . Diabetes Mother   . Cancer Mother     Breast  . Arthritis Brother   . Cancer Maternal Aunt     Breast  . Cancer Maternal Uncle     Lung and Colon  . Cirrhosis Brother     Social History   Social History  . Marital Status: Single    Spouse Name: N/A  . Number of Children: N/A  . Years of Education: N/A   Occupational History  . Not on file.   Social History Main Topics  . Smoking status: Never Smoker   . Smokeless tobacco: Never Used  . Alcohol Use: No  . Drug Use: No  . Sexual Activity: Yes   Other Topics Concern  . Not on file   Social History Narrative     Current outpatient prescriptions:  .  amLODipine-valsartan (EXFORGE) 5-160 MG per  tablet, Take 1 tablet by mouth daily., Disp: , Rfl: 0 .  aspirin 81 MG chewable tablet, Chew 1 tablet by mouth as needed., Disp: , Rfl:  .  atorvastatin (LIPITOR) 40 MG tablet, Take 1 tablet by mouth daily., Disp: , Rfl: 0 .  Butalbital-APAP-Caffeine 50-300-40 MG CAPS, take 1 capsule by mouth every 6 hours if needed for headache, Disp: 30 capsule, Rfl: 0 .  gabapentin (NEURONTIN) 300 MG capsule, Take 1 capsule by mouth 3 (three) times daily., Disp: , Rfl: 0 .  GLUCOSE BLOOD VI, , Disp: , Rfl:  .  magnesium oxide (MAG-OX) 400 MG tablet, Take 1 tablet by mouth 2 (two) times daily., Disp: , Rfl:  .  metFORMIN (GLUCOPHAGE) 850 MG tablet, Take 1 tablet by mouth daily., Disp: , Rfl: 0 .  omeprazole (PRILOSEC) 20 MG capsule, take 1 capsule by mouth every morning, Disp: 30 capsule, Rfl: 6 .  temazepam (RESTORIL) 15 MG capsule, Take 1 capsule by mouth as needed., Disp: , Rfl: 0 .  traMADol (ULTRAM) 50 MG tablet, Take 1 tablet by mouth as needed. For pain, Disp: , Rfl: 0 .  Vitamin D, Ergocalciferol, (DRISDOL) 50000 UNITS CAPS capsule, Take 1 capsule by mouth once a week., Disp: , Rfl: 0  No Known  Allergies   ROS  Constitutional: Negative for fever or weight change.  Respiratory: Negative for cough and shortness of breath.   Cardiovascular: Negative for chest pain or palpitations.  Gastrointestinal: Negative for abdominal pain, no bowel changes.  Musculoskeletal: Negative for gait problem or joint swelling.  Skin: Negative for rash.  Neurological: Negative for dizziness or headache.  No other specific complaints in a complete review of systems (except as listed in HPI above).  Objective  Filed Vitals:   08/16/15 1120  BP: 122/74  Pulse: 70  Temp: 98.4 F (36.9 C)  TempSrc: Oral  Resp: 14  Height: 5' 4"  (1.626 m)  Weight: 182 lb 12.8 oz (82.918 kg)  SpO2: 95%    Body mass index is 31.36 kg/(m^2).  Physical Exam  Constitutional: Patient appears well-developed and well-nourished. No  distress.  HENT: Head: Normocephalic and atraumatic. Ears: B TMs ok, no erythema or effusion; Nose: Nose normal. Mouth/Throat: Oropharynx is clear and moist. No oropharyngeal exudate.  Eyes: Conjunctivae and EOM are normal. Pupils are equal, round, and reactive to light. No scleral icterus.  Neck: Normal range of motion. Neck supple. No JVD present. No thyromegaly present.  Cardiovascular: Normal rate, regular rhythm and normal heart sounds.  No murmur heard. No BLE edema. Pulmonary/Chest: Effort normal and breath sounds normal. No respiratory distress. Abdominal: Soft. Bowel sounds are normal, no distension. There is no tenderness. no masses Breast: no lumps or masses, no nipple discharge or rashes FEMALE GENITALIA:  External genitalia normal External urethra normal Vaginal vault normal without discharge or lesions Cervix normal without discharge or lesions Bimanual exam normal without masses RECTAL: no rectal masses or hemorrhoids Musculoskeletal: Normal range of motion, no joint effusions. No gross deformities Neurological: he is alert and oriented to person, place, and time. No cranial nerve deficit. Coordination, balance, strength, speech and gait are normal. Paresthesia left lateral thigh Skin: Skin is warm and dry. No rash noted. No erythema.  Psychiatric: Patient has a normal mood and affect. behavior is normal. Judgment and thought content normal.  Recent Results (from the past 2160 hour(s))  CBC with Differential/Platelet     Status: Abnormal   Collection Time: 05/27/15 11:19 AM  Result Value Ref Range   WBC 9.8 3.6 - 11.0 K/uL   RBC 4.10 3.80 - 5.20 MIL/uL   Hemoglobin 10.9 (L) 12.0 - 16.0 g/dL   HCT 33.3 (L) 35.0 - 47.0 %   MCV 81.2 80.0 - 100.0 fL   MCH 26.6 26.0 - 34.0 pg   MCHC 32.7 32.0 - 36.0 g/dL   RDW 15.1 (H) 11.5 - 14.5 %   Platelets 226 150 - 440 K/uL   Neutrophils Relative % 64 %   Neutro Abs 6.3 1.4 - 6.5 K/uL   Lymphocytes Relative 28 %   Lymphs Abs 2.7  1.0 - 3.6 K/uL   Monocytes Relative 5 %   Monocytes Absolute 0.5 0.2 - 0.9 K/uL   Eosinophils Relative 2 %   Eosinophils Absolute 0.2 0 - 0.7 K/uL   Basophils Relative 1 %   Basophils Absolute 0.1 0 - 0.1 K/uL  Basic metabolic panel     Status: Abnormal   Collection Time: 05/27/15 11:19 AM  Result Value Ref Range   Sodium 141 135 - 145 mmol/L   Potassium 3.4 (L) 3.5 - 5.1 mmol/L   Chloride 106 101 - 111 mmol/L   CO2 26 22 - 32 mmol/L   Glucose, Bld 102 (H) 65 - 99 mg/dL  BUN 18 6 - 20 mg/dL   Creatinine, Ser 1.29 (H) 0.44 - 1.00 mg/dL   Calcium 8.9 8.9 - 10.3 mg/dL   GFR calc non Af Amer 46 (L) >60 mL/min   GFR calc Af Amer 53 (L) >60 mL/min    Comment: (NOTE) The eGFR has been calculated using the CKD EPI equation. This calculation has not been validated in all clinical situations. eGFR's persistently <60 mL/min signify possible Chronic Kidney Disease.    Anion gap 9 5 - 15  Urinalysis complete, with microscopic [facility])     Status: Abnormal   Collection Time: 05/27/15 11:19 AM  Result Value Ref Range   Color, Urine YELLOW (A) YELLOW   APPearance CLEAR (A) CLEAR   Glucose, UA NEGATIVE NEGATIVE mg/dL   Bilirubin Urine NEGATIVE NEGATIVE   Ketones, ur NEGATIVE NEGATIVE mg/dL   Specific Gravity, Urine 1.013 1.005 - 1.030   Hgb urine dipstick NEGATIVE NEGATIVE   pH 5.0 5.0 - 8.0   Protein, ur NEGATIVE NEGATIVE mg/dL   Nitrite NEGATIVE NEGATIVE   Leukocytes, UA NEGATIVE NEGATIVE   RBC / HPF 0-5 0 - 5 RBC/hpf   WBC, UA 0-5 0 - 5 WBC/hpf   Bacteria, UA RARE (A) NONE SEEN   Squamous Epithelial / LPF 0-5 (A) NONE SEEN   Hyaline Casts, UA PRESENT      PHQ2/9: Depression screen [facility] 2/9 06/18/2015  Decreased Interest 0  Down, Depressed, Hopeless 0  PHQ - 2 Score 0     Fall Risk: Fall Risk  06/18/2015  Falls in the past year? No      Assessment & Plan  1. Well woman exam   2. Cervical cancer screening  - Pap IG, CT/NG NAA, and HPV (high risk)  3. Breast cancer  screening  - MM Digital Screening; Future  4. Colon cancer screening Up to date  40. Health counseling Discussed importance of 150 minutes of physical activity weekly, eat two servings of fish weekly, eat one serving of tree nuts ( cashews, pistachios, pecans, almonds.Marland Kitchen) every other day, eat 6 servings of fruit/vegetables daily and drink plenty of water and avoid sweet beverages.   6. Needs flu shot  - Flu Vaccine QUAD 36+ mos IM

## 2015-08-16 NOTE — Telephone Encounter (Signed)
Forgot to ask for refill when u saw  her

## 2015-08-17 MED ORDER — BUTALBITAL-APAP-CAFFEINE 50-300-40 MG PO CAPS: 1.0000 | ORAL_CAPSULE | Freq: Four times a day (QID) | ORAL | Status: DC | PRN

## 2015-08-22 LAB — PAP IG, CT-NG NAA, HPV HIGH-RISK: PAP Smear Comment: 0

## 2015-08-22 NOTE — Progress Notes (Signed)
Left patient voicemail.

## 2015-09-02 ENCOUNTER — Encounter: Payer: Self-pay | Admitting: Medical Oncology

## 2015-09-02 ENCOUNTER — Emergency Department
Admission: EM | Admit: 2015-09-02 | Discharge: 2015-09-02 | Disposition: A | Discharge status: Home / Self Care | Payer: BLUE CROSS/BLUE SHIELD | Attending: Emergency Medicine | Admitting: Emergency Medicine

## 2015-09-02 DIAGNOSIS — X58XXXA Exposure to other specified factors, initial encounter: Secondary | ICD-10-CM | POA: Insufficient documentation

## 2015-09-02 DIAGNOSIS — Z7982 Long term (current) use of aspirin: Secondary | ICD-10-CM | POA: Insufficient documentation

## 2015-09-02 DIAGNOSIS — N183 Chronic kidney disease, stage 3 (moderate): Secondary | ICD-10-CM | POA: Insufficient documentation

## 2015-09-02 DIAGNOSIS — Z79899 Other long term (current) drug therapy: Secondary | ICD-10-CM | POA: Insufficient documentation

## 2015-09-02 DIAGNOSIS — S39012A Strain of muscle, fascia and tendon of lower back, initial encounter: Secondary | ICD-10-CM | POA: Diagnosis not present

## 2015-09-02 DIAGNOSIS — E119 Type 2 diabetes mellitus without complications: Secondary | ICD-10-CM | POA: Insufficient documentation

## 2015-09-02 DIAGNOSIS — Y999 Unspecified external cause status: Secondary | ICD-10-CM | POA: Insufficient documentation

## 2015-09-02 DIAGNOSIS — Y939 Activity, unspecified: Secondary | ICD-10-CM | POA: Insufficient documentation

## 2015-09-02 DIAGNOSIS — M545 Low back pain: Secondary | ICD-10-CM | POA: Diagnosis present

## 2015-09-02 DIAGNOSIS — I129 Hypertensive chronic kidney disease with stage 1 through stage 4 chronic kidney disease, or unspecified chronic kidney disease: Secondary | ICD-10-CM | POA: Diagnosis not present

## 2015-09-02 DIAGNOSIS — Y929 Unspecified place or not applicable: Secondary | ICD-10-CM | POA: Insufficient documentation

## 2015-09-02 LAB — GLUCOSE, CAPILLARY: Glucose-Capillary: 102 mg/dL — ABNORMAL HIGH (ref 65–99)

## 2015-09-02 LAB — URINALYSIS COMPLETE WITH MICROSCOPIC (ARMC ONLY)
Bilirubin Urine: NEGATIVE
Glucose, UA: NEGATIVE mg/dL
Hgb urine dipstick: NEGATIVE
Ketones, ur: NEGATIVE mg/dL
Leukocytes, UA: NEGATIVE
Nitrite: NEGATIVE
Protein, ur: NEGATIVE mg/dL
Specific Gravity, Urine: 1.01 (ref 1.005–1.030)
pH: 6 (ref 5.0–8.0)

## 2015-09-02 MED ORDER — CYCLOBENZAPRINE HCL 10 MG PO TABS
5.0000 mg | ORAL_TABLET | Freq: Once | ORAL | Status: AC
Start: 2015-09-02 — End: 2015-09-02
  Administered 2015-09-02: 5 mg via ORAL
  Filled 2015-09-02: qty 1

## 2015-09-02 MED ORDER — CYCLOBENZAPRINE HCL 5 MG PO TABS: 5.0000 mg | ORAL_TABLET | Freq: Three times a day (TID) | ORAL | Status: DC | PRN

## 2015-09-02 NOTE — ED Provider Notes (Addendum)
[facility]  Time seen: 5:42 PM  I have reviewed the triage vital signs and the nursing notes.   HISTORY  Chief Complaint Back Pain    HPI Brandy Johnston is a 54 y.o. female with a past medical history of hypertension, diabetes, hyperlipidemia, sickle cell who presents the emergency department with lower back pain. According to the patient for the past 3-4 days she has had lower back pain radiating down both of her legs. Denies any weakness or numbness. Denies any incontinence. Denies any fever. Patient does not recall an inciting event. She states it feels like her lower back is "spasming."    Past Medical History  Diagnosis Date  . Hypertension   . Diabetes mellitus without complication   . Hyperlipidemia   . Sickle-cell trait   . Paresthesia   . Sickle cell anemia   . Tension headache   . Hypokalemia   . Low calcium levels   . Chronic renal impairment, stage 3 (moderate)   . Insomnia   . Osteoarthrosis, hip     right hip    Patient Active Problem List   Diagnosis Date Noted  . Benign essential HTN 06/18/2015  . Chronic kidney disease (CKD), stage III (moderate) 06/18/2015  . Cramps of lower extremity 06/18/2015  . Diabetes mellitus with renal manifestation 06/18/2015  . Abnormal serum level of alkaline phosphatase 06/18/2015  . Neuritis or radiculitis due to rupture of lumbar intervertebral disc 06/18/2015  . Obesity (BMI 30.0-34.9) 06/18/2015  . Degenerative arthritis of hip 06/18/2015  . Sickle cell trait 06/18/2015  . Dyslipidemia 05/27/2010  . Vitamin D deficiency 05/27/2010  . Gastro-esophageal reflux disease without esophagitis 05/25/2008    Past Surgical History  Procedure Laterality Date  . Cholecystectomy    . Tubal ligation    . Fracture surgery Left     cast and pins   . Colonoscopy      Current Outpatient Rx  Name  Route  Sig  Dispense  Refill  . amLODipine-valsartan (EXFORGE) 5-160  MG per tablet   Oral   Take 1 tablet by mouth daily.      0   . aspirin 81 MG chewable tablet   Oral   Chew 1 tablet by mouth as needed.         Marland Kitchen atorvastatin (LIPITOR) 40 MG tablet   Oral   Take 1 tablet by mouth daily.      0   . Butalbital-APAP-Caffeine 50-300-40 MG CAPS   Oral   Take 1 capsule by mouth every 6 (six) hours as needed.   30 capsule   0   . gabapentin (NEURONTIN) 300 MG capsule   Oral   Take 1 capsule by mouth 3 (three) times daily.      0   . GLUCOSE BLOOD VI               . magnesium oxide (MAG-OX) 400 MG tablet   Oral   Take 1 tablet by mouth 2 (two) times daily.         . metFORMIN (GLUCOPHAGE) 850 MG tablet   Oral   Take 1 tablet by mouth daily.      0   . omeprazole (PRILOSEC) 20 MG capsule      take 1 capsule by mouth every morning   30 capsule   6     DX: 530.81   . temazepam (RESTORIL) 15 MG capsule   Oral  Take 1 capsule by mouth as needed.      0   . traMADol (ULTRAM) 50 MG tablet   Oral   Take 1 tablet by mouth as needed. For pain      0   . Vitamin D, Ergocalciferol, (DRISDOL) 50000 UNITS CAPS capsule   Oral   Take 1 capsule by mouth once a week.      0     Allergies Review of patient's allergies indicates no known allergies.  Family History  Problem Relation Age of Onset  . Migraines Mother   . Diabetes Mother   . Cancer Mother     Breast  . Arthritis Brother   . Cancer Maternal Aunt     Breast  . Cancer Maternal Uncle     Lung and Colon  . Cirrhosis Brother     Social History Social History  Substance Use Topics  . Smoking status: Never Smoker   . Smokeless tobacco: Never Used  . Alcohol Use: No    Review of Systems Constitutional: Negative for fever. Cardiovascular: Negative for chest pain. Respiratory: Negative for shortness of breath. Gastrointestinal: Negative for abdominal pain Genitourinary: Negative for dysuria. Dark urine at times. Musculoskeletal: Positive for lower  back pain. Skin: Negative for rash. 10-point ROS otherwise negative.  ____________________________________________   PHYSICAL EXAM:  VITAL SIGNS: ED Triage Vitals  Enc Vitals Group     BP 09/02/15 1653 145/75 mmHg     Pulse Rate 09/02/15 1653 79     Resp 09/02/15 1653 18     Temp 09/02/15 1653 99.3 F (37.4 C)     Temp Source 09/02/15 1653 Oral     SpO2 09/02/15 1653 98 %     Weight 09/02/15 1653 180 lb (81.647 kg)     Height 09/02/15 1653 5\' 3"  (1.6 m)     Head Cir --      Peak Flow --      Pain Score 09/02/15 1653 8     Pain Loc --      Pain Edu? --      Excl. in GC? --     Constitutional: Alert and oriented. Well appearing and in no distress. Eyes: Normal exam ENT   Mouth/Throat: Mucous membranes are moist. Cardiovascular: Normal rate, regular rhythm. No murmur Respiratory: Normal respiratory effort without tachypnea nor retractions. Breath sounds are clear and equal bilaterally. No wheezes/rales/rhonchi. Gastrointestinal: Soft and nontender. No distention.   Musculoskeletal: Moderate tenderness to palpation of the lower back especially off to the right side. No CVA tenderness palpation. Neurologic:  Normal speech and language. No gross focal neurologic deficits Skin:  Skin is warm, dry and intact.  Psychiatric: Mood and affect are normal. Speech and behavior are normal.  ____________________________________________     INITIAL IMPRESSION / ASSESSMENT AND PLAN / ED COURSE  Pertinent labs & imaging results that were available during my care of the patient were reviewed by me and considered in my medical decision making (see chart for details).  Patient with lower back pain, radiating to both of her legs. Most consistent with lumbar strain with radicular pain. We will place the patient on Flexeril, ibuprofen, have her follow up with her primary care doctor for further evaluation. Patient also notes as a secondary complaint intermittent dark urine, we will check  urinalysis.  Urinalysis is negative we will discharge patient.  ____________________________________________   FINAL CLINICAL IMPRESSION(S) / ED DIAGNOSES  Lumbar strain/low back pain   Minna Antis, MD 09/02/15  1745  Minna Antis, MD 09/02/15 [id number]

## 2015-09-02 NOTE — ED Notes (Signed)
C/O back pain since Thursday no injury if note.  Pt amb to room w/o difficulty.

## 2015-09-02 NOTE — Discharge Instructions (Signed)
Back Pain, Adult °Back pain is very common. The pain often gets better over time. The cause of back pain is usually not dangerous. Most people can learn to manage their back pain on their own.  °HOME CARE  °· Stay active. Start with short walks on flat ground if you can. Try to walk farther each day. °· Do not sit, drive, or stand in one place for more than 30 minutes. Do not stay in bed. °· Do not avoid exercise or work. Activity can help your back heal faster. °· Be careful when you bend or lift an object. Bend at your knees, keep the object close to you, and do not twist. °· Sleep on a firm mattress. Lie on your side, and bend your knees. If you lie on your back, put a pillow under your knees. °· Only take medicines as told by your doctor. °· Put ice on the injured area. °¨ Put ice in a plastic bag. °¨ Place a towel between your skin and the bag. °¨ Leave the ice on for 15-20 minutes, 03-04 times a day for the first 2 to 3 days. After that, you can switch between ice and heat packs. °· Ask your doctor about back exercises or massage. °· Avoid feeling anxious or stressed. Find good ways to deal with stress, such as exercise. °GET HELP RIGHT AWAY IF:  °· Your pain does not go away with rest or medicine. °· Your pain does not go away in 1 week. °· You have new problems. °· You do not feel well. °· The pain spreads into your legs. °· You cannot control when you poop (bowel movement) or pee (urinate). °· Your arms or legs feel weak or lose feeling (numbness). °· You feel sick to your stomach (nauseous) or throw up (vomit). °· You have belly (abdominal) pain. °· You feel like you may pass out (faint). °MAKE SURE YOU:  °· Understand these instructions. °· Will watch your condition. °· Will get help right away if you are not doing well or get worse. °Document Released: 06/01/2008 Document Revised: 03/07/2012 Document Reviewed: 04/17/2014 °[employer]® Patient Information ©2015 [employer]. This information is not intended  to replace advice given to you by your health care provider. Make sure you discuss any questions you have with your health care provider. ° °

## 2015-09-02 NOTE — ED Notes (Signed)
Pt ambulatory to triage with reports that she has been having lower back pain that radiates down both legs since Thursday without injury. Pt also reports that she has been checking her blood sugars at home and they have been elevated- as high as 300. Pt states that she takes metformin and has been taking as prescribed.

## 2015-09-03 ENCOUNTER — Other Ambulatory Visit: Payer: Self-pay | Admitting: Family Medicine

## 2015-09-03 NOTE — Telephone Encounter (Signed)
Patient requesting refill. 

## 2015-09-13 ENCOUNTER — Ambulatory Visit
Admission: RE | Admit: 2015-09-13 | Discharge: 2015-09-13 | Disposition: A | Discharge status: Home / Self Care | Payer: BLUE CROSS/BLUE SHIELD | Source: Ambulatory Visit | Attending: Family Medicine | Admitting: Family Medicine

## 2015-09-13 DIAGNOSIS — Z1231 Encounter for screening mammogram for malignant neoplasm of breast: Secondary | ICD-10-CM | POA: Diagnosis not present

## 2015-09-13 DIAGNOSIS — Z1239 Encounter for other screening for malignant neoplasm of breast: Secondary | ICD-10-CM

## 2015-09-18 ENCOUNTER — Encounter: Payer: Self-pay | Admitting: Family Medicine

## 2015-09-18 ENCOUNTER — Ambulatory Visit (INDEPENDENT_AMBULATORY_CARE_PROVIDER_SITE_OTHER): Payer: BLUE CROSS/BLUE SHIELD | Admitting: Family Medicine

## 2015-09-18 ENCOUNTER — Telehealth: Payer: Self-pay | Admitting: Family Medicine

## 2015-09-18 ENCOUNTER — Other Ambulatory Visit: Payer: Self-pay

## 2015-09-18 VITALS — BP 122/58 | HR 82 | Temp 98.2°F | Resp 18 | Ht 63.0 in | Wt 182.7 lb

## 2015-09-18 DIAGNOSIS — E1129 Type 2 diabetes mellitus with other diabetic kidney complication: Secondary | ICD-10-CM | POA: Diagnosis not present

## 2015-09-18 DIAGNOSIS — M5116 Intervertebral disc disorders with radiculopathy, lumbar region: Secondary | ICD-10-CM

## 2015-09-18 DIAGNOSIS — I10 Essential (primary) hypertension: Secondary | ICD-10-CM

## 2015-09-18 DIAGNOSIS — G4700 Insomnia, unspecified: Secondary | ICD-10-CM | POA: Diagnosis not present

## 2015-09-18 DIAGNOSIS — G43009 Migraine without aura, not intractable, without status migrainosus: Secondary | ICD-10-CM | POA: Diagnosis not present

## 2015-09-18 DIAGNOSIS — E1140 Type 2 diabetes mellitus with diabetic neuropathy, unspecified: Secondary | ICD-10-CM | POA: Insufficient documentation

## 2015-09-18 DIAGNOSIS — Z79899 Other long term (current) drug therapy: Secondary | ICD-10-CM | POA: Diagnosis not present

## 2015-09-18 DIAGNOSIS — K219 Gastro-esophageal reflux disease without esophagitis: Secondary | ICD-10-CM | POA: Diagnosis not present

## 2015-09-18 DIAGNOSIS — E1142 Type 2 diabetes mellitus with diabetic polyneuropathy: Secondary | ICD-10-CM

## 2015-09-18 DIAGNOSIS — IMO0002 Reserved for concepts with insufficient information to code with codable children: Secondary | ICD-10-CM

## 2015-09-18 DIAGNOSIS — E1165 Type 2 diabetes mellitus with hyperglycemia: Secondary | ICD-10-CM

## 2015-09-18 DIAGNOSIS — E785 Hyperlipidemia, unspecified: Secondary | ICD-10-CM

## 2015-09-18 LAB — POCT GLYCOSYLATED HEMOGLOBIN (HGB A1C): Hemoglobin A1C: 6.4

## 2015-09-18 MED ORDER — TEMAZEPAM 15 MG PO CAPS: 15.0000 mg | ORAL_CAPSULE | ORAL | Status: DC | PRN

## 2015-09-18 MED ORDER — BUTALBITAL-APAP-CAFFEINE 50-300-40 MG PO CAPS: 1.0000 | ORAL_CAPSULE | Freq: Four times a day (QID) | ORAL | Status: DC | PRN

## 2015-09-18 MED ORDER — AMLODIPINE BESYLATE-VALSARTAN 5-160 MG PO TABS: 1.0000 | ORAL_TABLET | Freq: Every day | ORAL | Status: DC

## 2015-09-18 MED ORDER — TRAMADOL HCL 50 MG PO TABS: 50.0000 mg | ORAL_TABLET | Freq: Two times a day (BID) | ORAL | Status: DC | PRN

## 2015-09-18 MED ORDER — VITAMIN D (ERGOCALCIFEROL) 1.25 MG (50000 UNIT) PO CAPS: 50000.0000 [IU] | ORAL_CAPSULE | ORAL | Status: DC

## 2015-09-18 MED ORDER — ATORVASTATIN CALCIUM 40 MG PO TABS: 40.0000 mg | ORAL_TABLET | Freq: Every evening | ORAL | Status: DC

## 2015-09-18 MED ORDER — GABAPENTIN 300 MG PO CAPS: 300.0000 mg | ORAL_CAPSULE | Freq: Two times a day (BID) | ORAL | Status: DC

## 2015-09-18 MED ORDER — METFORMIN HCL 850 MG PO TABS: 850.0000 mg | ORAL_TABLET | Freq: Every evening | ORAL | Status: DC

## 2015-09-18 MED ORDER — CYCLOBENZAPRINE HCL 5 MG PO TABS: 5.0000 mg | ORAL_TABLET | Freq: Three times a day (TID) | ORAL | Status: DC | PRN

## 2015-09-18 NOTE — Telephone Encounter (Signed)
You where suppose to refill at her visit?

## 2015-09-18 NOTE — Progress Notes (Signed)
Name: Brandy Johnston   MRN: [id number]    DOB: 1961-09-24   Date:09/18/2015       Progress Note  Subjective  Chief Complaint  Chief Complaint  Patient presents with  . Medication Management    1 month F/U  . Diabetes    Checks BG 2x day low-90,avg-110,high-300.  Pt states having burning sensation in hands and feet  . Insomnia    still waking up around 3:00am every morning  . Hypertension  . Hyperlipidemia  . Gastrophageal Reflux  . Migraine    having 3-4x a month    HPI  DMII with renal manifestation and neuropathy. States now finger tips and toes are always tingling and is bothersome but not painful. Glucose at home is usually at goal, but went  Up to 300 when she had to go to [facility] for back spasms.  She has occasional polyphagia, polydipsia but no polyuria. She is compliant with Metformin  Insomnia: She is taking Temazepam, still waking up at 3 am, but able to fall back asleep. She usually wakes up feeling rested in am  HTN: taking medication bp is at goal   Hyperlipidemia: taking Lipitor and denies side effects  GERD: taking Omeprazole and symptoms are under control, denies symptoms when taking medication   Migraine: described as sharp, throbbing, usually on temporal area, associated with phonophobia and photophobia, responding well to medication   Left lumbar radiculitis: with a flare recently and severe muscle spasms, went to [facility], was given Flexeril and is feeling better now, taking Gabapentin but only once or twice daily because makes her sleepy during the day. The left outer thigh and bottom of left foot stays numb. She had NCS and MRI. Discussed referral to Dr. Council Mechanic but she wants to hold off for now  Patient Active Problem List   Diagnosis Date Noted  . Diabetic neuropathy associated with type 2 diabetes mellitus 09/18/2015  . Insomnia 09/18/2015  . Benign essential HTN 06/18/2015  . Chronic kidney disease (CKD), stage III (moderate) 06/18/2015  . Cramps of lower  extremity 06/18/2015  . Diabetes mellitus with renal manifestation 06/18/2015  . Abnormal serum level of alkaline phosphatase 06/18/2015  . Neuritis or radiculitis due to rupture of lumbar intervertebral disc 06/18/2015  . Obesity (BMI 30.0-34.9) 06/18/2015  . Degenerative arthritis of hip 06/18/2015  . Sickle cell trait 06/18/2015  . Dyslipidemia 05/27/2010  . Gastro-esophageal reflux disease without esophagitis 05/25/2008    Past Surgical History  Procedure Laterality Date  . Cholecystectomy    . Tubal ligation    . Fracture surgery Left     cast and pins   . Colonoscopy      Family History  Problem Relation Age of Onset  . Migraines Mother   . Diabetes Mother   . Cancer Mother     Breast  . Arthritis Brother   . Cancer Maternal Aunt     Breast  . Breast cancer Maternal Aunt   . Cancer Maternal Uncle     Lung and Colon  . Cirrhosis Brother     Social History   Social History  . Marital Status: Single    Spouse Name: N/A  . Number of Children: N/A  . Years of Education: N/A   Occupational History  . Not on file.   Social History Main Topics  . Smoking status: Never Smoker   . Smokeless tobacco: Never Used  . Alcohol Use: No  . Drug Use: No  . Sexual Activity:  Yes   Other Topics Concern  . Not on file   Social History Narrative     Current outpatient prescriptions:  .  amLODipine-valsartan (EXFORGE) 5-160 MG per tablet, Take 1 tablet by mouth daily., Disp: 90 tablet, Rfl: 1 .  aspirin 81 MG chewable tablet, Chew 1 tablet by mouth as needed., Disp: , Rfl:  .  atorvastatin (LIPITOR) 40 MG tablet, Take 1 tablet (40 mg total) by mouth every evening., Disp: 90 tablet, Rfl: 1 .  Butalbital-APAP-Caffeine 50-300-40 MG CAPS, Take 1 capsule by mouth every 6 (six) hours as needed., Disp: 40 capsule, Rfl: 0 .  cyclobenzaprine (FLEXERIL) 5 MG tablet, Take 1 tablet (5 mg total) by mouth every 8 (eight) hours as needed for muscle spasms., Disp: 90 tablet, Rfl: 2 .   gabapentin (NEURONTIN) 300 MG capsule, Take 1 capsule (300 mg total) by mouth 2 (two) times daily., Disp: 180 capsule, Rfl: 0 .  GLUCOSE BLOOD VI, , Disp: , Rfl:  .  magnesium oxide (MAG-OX) 400 MG tablet, Take 1 tablet by mouth 2 (two) times daily., Disp: , Rfl:  .  metFORMIN (GLUCOPHAGE) 850 MG tablet, Take 1 tablet (850 mg total) by mouth every evening., Disp: 30 tablet, Rfl: 0 .  omeprazole (PRILOSEC) 20 MG capsule, take 1 capsule by mouth every morning, Disp: 30 capsule, Rfl: 6 .  temazepam (RESTORIL) 15 MG capsule, Take 1 capsule (15 mg total) by mouth as needed., Disp: 30 capsule, Rfl: 2 .  traMADol (ULTRAM) 50 MG tablet, Take 1 tablet (50 mg total) by mouth every 12 (twelve) hours as needed. For pain, Disp: 60 tablet, Rfl: 2 .  Vitamin D, Ergocalciferol, (DRISDOL) 50000 UNITS CAPS capsule, Take 1 capsule by mouth once a week., Disp: , Rfl: 0  No Known Allergies   ROS  Constitutional: Negative for fever or weight change.  Respiratory: Negative for cough and shortness of breath.   Cardiovascular: Negative for chest pain or palpitations.  Gastrointestinal: Negative for abdominal pain, no bowel changes.  Musculoskeletal: Negative for gait problem or joint swelling.  Skin: Negative for rash.  Neurological: Negative for dizziness or headache.  No other specific complaints in a complete review of systems (except as listed in HPI above). Objective  Filed Vitals:   09/18/15 0955  BP: 122/58  Pulse: 82  Temp: 98.2 F (36.8 C)  TempSrc: Oral  Resp: 18  Height: 5\' 3"  (1.6 m)  Weight: 182 lb 11.2 oz (82.872 kg)  SpO2: 98%    Body mass index is 32.37 kg/(m^2).  Physical Exam  Constitutional: Patient appears well-developed and well-nourished. Obese No distress.  HEENT: head atraumatic, normocephalic, pupils equal and reactive to light, ears normal  neck supple, throat within normal limits Cardiovascular: Normal rate, regular rhythm and normal heart sounds.  No murmur heard. No BLE  edema. Pulmonary/Chest: Effort normal and breath sounds normal. No respiratory distress. Abdominal: Soft.  There is no tenderness. Psychiatric: Patient has a normal mood and affect. behavior is normal. Judgment and thought content normal. Muscular Skeletal: normal back exam , negative straight leg raise  Recent Results (from the past 2160 hour(s))  Pap IG, CT/NG NAA, and HPV (high risk)     Status: Abnormal   Collection Time: 08/16/15 12:00 AM  Result Value Ref Range   DIAGNOSIS: Comment (A)     Comment: EPITHELIAL CELL ABNORMALITY. ATYPICAL SQUAMOUS CELLS OF UNDETERMINED SIGNIFICANCE.    Recommendation: Comment (A)     Comment: Suggest follow up as clinically appropriate.  Specimen adequacy: Comment     Comment: Satisfactory for evaluation. Endocervical and/or squamous metaplastic cells (endocervical component) are present.    CLINICIAN PROVIDED ICD10: Comment     Comment: Z12.4   Performed by: Comment     Comment: Rogue Jury, Cytotechnologist (ASCP)   Electronically signed by: Comment     Comment: Rod Mae, MD, Pathologist   PAP SMEAR COMMENT .    PATHOLOGIST PROVIDED ICD10: Comment     Comment: R87.610   Note: Comment     Comment: The Pap smear is a screening test designed to aid in the detection of premalignant and malignant conditions of the uterine cervix.  It is not a diagnostic procedure and should not be used as the sole means of detecting cervical cancer.  Both false-positive and false-negative reports do occur.    Test Methodology Comment     Comment: This liquid based ThinPrep(R) pap test was screened with the use of an image guided system.   Glucose, capillary     Status: Abnormal   Collection Time: 09/02/15  4:57 PM  Result Value Ref Range   Glucose-Capillary 102 (H) 65 - 99 mg/dL  Urinalysis complete, with microscopic ([facility] only)     Status: Abnormal   Collection Time: 09/02/15  5:48 PM  Result Value Ref Range   Color, Urine STRAW (A) YELLOW    APPearance CLEAR (A) CLEAR   Glucose, UA NEGATIVE NEGATIVE mg/dL   Bilirubin Urine NEGATIVE NEGATIVE   Ketones, ur NEGATIVE NEGATIVE mg/dL   Specific Gravity, Urine 1.010 1.005 - 1.030   Hgb urine dipstick NEGATIVE NEGATIVE   pH 6.0 5.0 - 8.0   Protein, ur NEGATIVE NEGATIVE mg/dL   Nitrite NEGATIVE NEGATIVE   Leukocytes, UA NEGATIVE NEGATIVE   RBC / HPF 0-5 0 - 5 RBC/hpf   WBC, UA 0-5 0 - 5 WBC/hpf   Bacteria, UA RARE (A) NONE SEEN   Squamous Epithelial / LPF 0-5 (A) NONE SEEN   Mucous PRESENT    Hyaline Casts, UA PRESENT   POCT HgB A1C     Status: None   Collection Time: 09/18/15  9:59 AM  Result Value Ref Range   Hemoglobin A1C 6.4      PHQ2/9: Depression screen PHQ 2/9 06/18/2015  Decreased Interest 0  Down, Depressed, Hopeless 0  PHQ - 2 Score 0     Fall Risk: Fall Risk  06/18/2015  Falls in the past year? No     Assessment & Plan  1. Type II diabetes mellitus with renal manifestations, uncontrolled  - POCT HgB A1C at goal, continue medication, check B12 level  - metFORMIN (GLUCOPHAGE) 850 MG tablet; Take 1 tablet (850 mg total) by mouth every evening.  Dispense: 30 tablet; Refill: 0  2.  Migraine without aura  Continue prn medication  - Butalbital-APAP-Caffeine 50-300-40 MG CAPS; Take 1 capsule by mouth every 6 (six) hours as needed.  Dispense: 40 capsule; Refill: 0  3. Diabetic polyneuropathy associated with type 2 diabetes mellitus  - gabapentin (NEURONTIN) 300 MG capsule; Take 1 capsule (300 mg total) by mouth 2 (two) times daily.  Dispense: 180 capsule; Refill: 0 - metFORMIN (GLUCOPHAGE) 850 MG tablet; Take 1 tablet (850 mg total) by mouth every evening.  Dispense: 30 tablet; Refill: 0  4. Gastro-esophageal reflux disease without esophagitis controlled  5. Benign essential HTN  - amLODipine-valsartan (EXFORGE) 5-160 MG per tablet; Take 1 tablet by mouth daily.  Dispense: 90 tablet; Refill: 1  6. Dyslipidemia  -  atorvastatin (LIPITOR) 40 MG  tablet; Take 1 tablet (40 mg total) by mouth every evening.  Dispense: 90 tablet; Refill: 1  7. Long-term use of high-risk medication  - Comprehensive metabolic panel - Vitamin B12  8. Insomnia  - temazepam (RESTORIL) 15 MG capsule; Take 1 capsule (15 mg total) by mouth as needed.  Dispense: 30 capsule; Refill: 2  9. Neuritis or radiculitis due to rupture of lumbar intervertebral disc  - cyclobenzaprine (FLEXERIL) 5 MG tablet; Take 1 tablet (5 mg total) by mouth every 8 (eight) hours as needed for muscle spasms.  Dispense: 90 tablet; Refill: 2 - gabapentin (NEURONTIN) 300 MG capsule; Take 1 capsule (300 mg total) by mouth 2 (two) times daily.  Dispense: 180 capsule; Refill: 0 - traMADol (ULTRAM) 50 MG tablet; Take 1 tablet (50 mg total) by mouth every 12 (twelve) hours as needed. For pain  Dispense: 60 tablet; Refill: 2

## 2015-09-18 NOTE — Telephone Encounter (Signed)
Patient was told that a prescription for vitamin d would be called in to her pharmacy. Patient is there waiting and it is not there.

## 2015-09-19 LAB — COMPREHENSIVE METABOLIC PANEL
ALT: 14 IU/L (ref 0–32)
AST: 17 IU/L (ref 0–40)
Albumin/Globulin Ratio: 1.1 (ref 1.1–2.5)
Albumin: 4.2 g/dL (ref 3.5–5.5)
Alkaline Phosphatase: 145 IU/L — ABNORMAL HIGH (ref 39–117)
BUN/Creatinine Ratio: 11 (ref 9–23)
BUN: 11 mg/dL (ref 6–24)
Bilirubin Total: 0.3 mg/dL (ref 0.0–1.2)
CO2: 23 mmol/L (ref 18–29)
Calcium: 7.8 mg/dL — ABNORMAL LOW (ref 8.7–10.2)
Chloride: 101 mmol/L (ref 97–108)
Creatinine, Ser: 1.04 mg/dL — ABNORMAL HIGH (ref 0.57–1.00)
GFR calc Af Amer: 69 mL/min/{1.73_m2} (ref >59)
GFR calc non Af Amer: 60 mL/min/{1.73_m2} (ref >59)
Globulin, Total: 3.7 g/dL (ref 1.5–4.5)
Glucose: 90 mg/dL (ref 65–99)
Potassium: 3.8 mmol/L (ref 3.5–5.2)
Sodium: 143 mmol/L (ref 134–144)
Total Protein: 7.9 g/dL (ref 6.0–8.5)

## 2015-09-19 LAB — VITAMIN B12: Vitamin B-12: 664 pg/mL (ref 211–946)

## 2015-09-20 NOTE — Progress Notes (Signed)
Left vm of labs results.

## 2015-11-09 ENCOUNTER — Other Ambulatory Visit: Payer: Self-pay | Admitting: Family Medicine

## 2015-11-11 NOTE — Telephone Encounter (Signed)
Informed patient through voicemail that prescription was at the pharmacy and that a appointment need to be made

## 2015-12-03 ENCOUNTER — Encounter: Payer: Self-pay | Admitting: Family Medicine

## 2015-12-03 ENCOUNTER — Ambulatory Visit (INDEPENDENT_AMBULATORY_CARE_PROVIDER_SITE_OTHER): Payer: BLUE CROSS/BLUE SHIELD | Admitting: Family Medicine

## 2015-12-03 VITALS — BP 134/78 | HR 89 | Temp 98.4°F | Resp 16 | Ht 63.0 in | Wt 174.8 lb

## 2015-12-03 DIAGNOSIS — R6881 Early satiety: Secondary | ICD-10-CM | POA: Diagnosis not present

## 2015-12-03 DIAGNOSIS — M25511 Pain in right shoulder: Secondary | ICD-10-CM | POA: Diagnosis not present

## 2015-12-03 DIAGNOSIS — R634 Abnormal weight loss: Secondary | ICD-10-CM

## 2015-12-03 DIAGNOSIS — E559 Vitamin D deficiency, unspecified: Secondary | ICD-10-CM

## 2015-12-03 DIAGNOSIS — Z23 Encounter for immunization: Secondary | ICD-10-CM

## 2015-12-03 DIAGNOSIS — M25512 Pain in left shoulder: Secondary | ICD-10-CM | POA: Diagnosis not present

## 2015-12-03 MED ORDER — MELOXICAM 15 MG PO TABS: 15.0000 mg | ORAL_TABLET | Freq: Every day | ORAL | Status: DC

## 2015-12-03 MED ORDER — VITAMIN D (ERGOCALCIFEROL) 1.25 MG (50000 UNIT) PO CAPS: 50000.0000 [IU] | ORAL_CAPSULE | ORAL | Status: DC

## 2015-12-03 NOTE — Patient Instructions (Signed)
Rotator Cuff Tendinitis  Rotator cuff tendinitis is inflammation of the tough, cord-like bands that connect muscle to bone (tendons) in your rotator cuff. Your rotator cuff is the collection of all the muscles and tendons that connect your arm to your shoulder. Your rotator cuff holds the head of your upper arm bone (humerus) in the cup (fossa) of your shoulder blade (scapula).  CAUSES  Rotator cuff tendinitis is usually caused by overusing the joint involved.   SIGNS AND SYMPTOMS  · Deep ache in the shoulder also felt on the outside upper arm over the shoulder muscle.  · Point tenderness over the area that is injured.  · Pain comes on gradually and becomes worse with lifting the arm to the side (abduction) or turning it inward (internal rotation).  · May lead to a chronic tear: When a rotator cuff tendon becomes inflamed, it runs the risk of losing its blood supply, causing some tendon fibers to die. This increases the risk that the tendon can fray and partially or completely tear.  DIAGNOSIS  Rotator cuff tendinitis is diagnosed by taking a medical history, performing a physical exam, and reviewing results of imaging exams. The medical history is useful to help determine the type of rotator cuff injury. The physical exam will include looking at the injured shoulder, feeling the injured area, and watching you do range-of-motion exercises. X-ray exams are typically done to rule out other causes of shoulder pain, such as fractures. MRI is the imaging exam usually used for significant shoulder injuries. Sometimes a dye study called CT arthrogram is done, but it is not as widely used as MRI. In some institutions, special ultrasound tests may also be used to aid in the diagnosis.  TREATMENT   Less Severe Cases  · Use of a sling to rest the shoulder for a short period of time. Prolonged use of the sling can cause stiffness, weakness, and loss of motion of the shoulder joint.  · Anti-inflammatory medicines, such as  ibuprofen or naproxen sodium, may be prescribed.  More Severe Cases  · Physical therapy.  · Use of steroid injections into the shoulder joint.  · Surgery.  HOME CARE INSTRUCTIONS   · Use a sling or splint until the pain decreases. Prolonged use of the sling can cause stiffness, weakness, and loss of motion of the shoulder joint.  · Apply ice to the injured area:    Put ice in a plastic bag.    Place a towel between your skin and the bag.    Leave the ice on for 20 minutes, 2-3 times a day.  · Try to avoid use other than gentle range of motion while your shoulder is painful. Use the shoulder and exercise only as directed by your health care provider. Stop exercises or range of motion if pain or discomfort increases, unless directed otherwise by your health care provider.  · Only take over-the-counter or prescription medicines for pain, discomfort, or fever as directed by your health care provider.  · If you were given a shoulder sling and straps (immobilizer), do not remove it except as directed, or until you see a health care provider for a follow-up exam. If you need to remove it, move your arm as little as possible or as directed.  · You may want to sleep on several pillows at night to lessen swelling and pain.  SEEK IMMEDIATE MEDICAL CARE IF:   · Your shoulder pain increases or new pain develops in your arm, hand,   or fingers and is not relieved with medicines.  · You have new, unexplained symptoms, especially increased numbness in the hands or loss of strength.  · You develop any worsening of the problems that brought you in for care.  · Your arm, hand, or fingers are numb or tingling.  · Your arm, hand, or fingers are swollen, painful, or turn white or blue.  MAKE SURE YOU:  · Understand these instructions.  · Will watch your condition.  · Will get help right away if you are not doing well or get worse.     This information is not intended to replace advice given to you by your health care provider. Make sure  you discuss any questions you have with your health care provider.     Document Released: 03/05/2004 Document Revised: 01/04/2015 Document Reviewed: 07/26/2013  Elsevier Interactive Patient Education ©2016 [employer]

## 2015-12-03 NOTE — Progress Notes (Signed)
Name: Brandy Johnston   MRN: [id number]    DOB: 03/16/1961   Date:12/03/2015       Progress Note  Subjective  Chief Complaint  Chief Complaint  Patient presents with  . Weight Loss  . Pain    bilateral shoulder pain onset 2weeks    HPI  Early satiety: she has noticed that over the past 3 months she eats as smalls meal and feels full for the entire day. No dysphagia. No chocking. She states after she eats she feels full all day.  Sometimes she is unable to eat her entire meals, only able to eat a few bites. She has lost 9 lbs since her last visit in our office less than 3 months ago. No fever, no chills, no change in bowel movements or blood in the stools. She has DM and is taking Metformin, last hgbA1C was at goal  Bilateral shoulder pain: symptoms started a few months ago and is getting progressively worse. 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  Pain is described as a soreness on both shoulder, worse on anterior shoulder. Also with movement of shoulder, such as abducting or internal rotation. Pain is sharp with movement. No rashes, no swelling or redness. Not taking any nsaid's at this time.    Patient Active Problem List   Diagnosis Date Noted  . Diabetic neuropathy associated with type 2 diabetes mellitus (HCC) 09/18/2015  . Insomnia 09/18/2015  . Benign essential HTN 06/18/2015  . Chronic kidney disease (CKD), stage III (moderate) 06/18/2015  . Cramps of lower extremity 06/18/2015  . Diabetes mellitus with renal manifestation (HCC) 06/18/2015  . Abnormal serum level of alkaline phosphatase 06/18/2015  . Neuritis or radiculitis due to rupture of lumbar intervertebral disc 06/18/2015  . Obesity (BMI 30.0-34.9) 06/18/2015  . Degenerative arthritis of hip 06/18/2015  . Sickle cell trait (HCC) 06/18/2015  . Dyslipidemia 05/27/2010  . Gastro-esophageal reflux disease without esophagitis 05/25/2008    Past Surgical History  Procedure  Laterality Date  . Cholecystectomy    . Tubal ligation    . Fracture surgery Left     cast and pins   . Colonoscopy      Family History  Problem Relation Age of Onset  . Migraines Mother   . Diabetes Mother   . Cancer Mother     Breast  . Arthritis Brother   . Cancer Maternal Aunt     Breast  . Breast cancer Maternal Aunt   . Cancer Maternal Uncle     Lung and Colon  . Cirrhosis Brother     Social History   Social History  . Marital Status: Single    Spouse Name: N/A  . Number of Children: N/A  . Years of Education: N/A   Occupational History  . Not on file.   Social History Main Topics  . Smoking status: Never Smoker   . Smokeless tobacco: Never Used  . Alcohol Use: No  . Drug Use: No  . Sexual Activity: Yes   Other Topics Concern  . Not on file   Social History Narrative     Current outpatient prescriptions:  .  amLODipine-valsartan (EXFORGE) 5-160 MG per tablet, Take 1 tablet by mouth daily., Disp: 90 tablet, Rfl: 1 .  aspirin 81 MG chewable tablet, Chew 1 tablet by mouth as needed., Disp: , Rfl:  .  atorvastatin (LIPITOR) 40 MG tablet, Take 1 tablet (40 mg total) by mouth every evening.,  Disp: 90 tablet, Rfl: 1 .  Butalbital-APAP-Caffeine 50-300-40 MG CAPS, Take 1 capsule by mouth every 6 (six) hours as needed., Disp: 40 capsule, Rfl: 0 .  cyclobenzaprine (FLEXERIL) 5 MG tablet, Take 1 tablet (5 mg total) by mouth every 8 (eight) hours as needed for muscle spasms., Disp: 90 tablet, Rfl: 2 .  gabapentin (NEURONTIN) 300 MG capsule, Take 1 capsule (300 mg total) by mouth 2 (two) times daily., Disp: 180 capsule, Rfl: 0 .  GLUCOSE BLOOD VI, , Disp: , Rfl:  .  magnesium oxide (MAG-OX) 400 MG tablet, Take 1 tablet by mouth 2 (two) times daily., Disp: , Rfl:  .  meloxicam (MOBIC) 15 MG tablet, Take 1 tablet (15 mg total) by mouth daily., Disp: 30 tablet, Rfl: 0 .  metFORMIN (GLUCOPHAGE) 850 MG tablet, take 1 tablet by mouth every evening, Disp: 30 tablet, Rfl:  2 .  omeprazole (PRILOSEC) 20 MG capsule, take 1 capsule by mouth every morning, Disp: 30 capsule, Rfl: 6 .  temazepam (RESTORIL) 15 MG capsule, Take 1 capsule (15 mg total) by mouth as needed., Disp: 30 capsule, Rfl: 2 .  traMADol (ULTRAM) 50 MG tablet, Take 1 tablet (50 mg total) by mouth every 12 (twelve) hours as needed. For pain, Disp: 60 tablet, Rfl: 2 .  Vitamin D, Ergocalciferol, (DRISDOL) 50000 UNITS CAPS capsule, Take 1 capsule (50,000 Units total) by mouth once a week., Disp: 30 capsule, Rfl: 0  No Known Allergies   ROS  Ten systems reviewed and is negative except as mentioned in HPI   Objective  Filed Vitals:   12/03/15 1025  BP: 134/78  Pulse: 89  Temp: 98.4 F (36.9 C)  TempSrc: Oral  Resp: 16  Height: 5\' 3"  (1.6 m)  Weight: 174 lb 12.8 oz (79.289 kg)  SpO2: 96%    Body mass index is 30.97 kg/(m^2).  Physical Exam  Constitutional: Patient appears well-developed and well-nourished. Obese  No distress.  HEENT: head atraumatic, normocephalic, pupils equal and reactive to light, neck supple, throat within normal limits Cardiovascular: Normal rate, regular rhythm and normal heart sounds.  No murmur heard. No BLE edema. Pulmonary/Chest: Effort normal and breath sounds normal. No respiratory distress. Abdominal: Soft.  There is no tenderness. Psychiatric: Patient has a normal mood and affect. behavior is normal. Judgment and thought content normal. Muscular Skeletal: pain during palpation of both anterior shoulders, pain with abduction of both shoulder, internal rotation and positive empty can sign.   Recent Results (from the past 2160 hour(s))  POCT HgB A1C     Status: None   Collection Time: 09/18/15  9:59 AM  Result Value Ref Range   Hemoglobin A1C 6.4   Comprehensive metabolic panel     Status: Abnormal   Collection Time: 09/18/15 11:15 AM  Result Value Ref Range   Glucose 90 65 - 99 mg/dL   BUN 11 6 - 24 mg/dL   Creatinine, Ser 1.611.04 (H) 0.57 - 1.00 mg/dL    GFR calc non Af Amer 60 >59 mL/min/1.73   GFR calc Af Amer 69 >59 mL/min/1.73   BUN/Creatinine Ratio 11 9 - 23   Sodium 143 134 - 144 mmol/L   Potassium 3.8 3.5 - 5.2 mmol/L   Chloride 101 97 - 108 mmol/L   CO2 23 18 - 29 mmol/L   Calcium 7.8 (L) 8.7 - 10.2 mg/dL   Total Protein 7.9 6.0 - 8.5 g/dL   Albumin 4.2 3.5 - 5.5 g/dL   Globulin, Total 3.7 1.5 -  4.5 g/dL   Albumin/Globulin Ratio 1.1 1.1 - 2.5   Bilirubin Total 0.3 0.0 - 1.2 mg/dL   Alkaline Phosphatase 145 (H) 39 - 117 IU/L   AST 17 0 - 40 IU/L   ALT 14 0 - 32 IU/L  Vitamin B12     Status: None   Collection Time: 09/18/15 11:15 AM  Result Value Ref Range   Vitamin B-12 664 211 - 946 pg/mL     PHQ2/9: Depression screen [facility] 2/9 12/03/2015 06/18/2015  Decreased Interest 0 0  Down, Depressed, Hopeless 0 0  PHQ - 2 Score 0 0     Fall Risk: Fall Risk  12/03/2015 06/18/2015  Falls in the past year? No No     Functional Status Survey: Is the patient deaf or have difficulty hearing?: No Does the patient have difficulty seeing, even when wearing glasses/contacts?: Yes (contacts/glasses) Does the patient have difficulty concentrating, remembering, or making decisions?: No Does the patient have difficulty walking or climbing stairs?: No Does the patient have difficulty dressing or bathing?: No Does the patient have difficulty doing errands alone such as visiting a doctor's office or shopping?: No   Assessment & Plan  1. Bilateral shoulder pain  Possible rotator cuff tendinitis, we will try Meloxicam and ice daily and if no improvement call back for referral to Ortho. It may be work related injury. It may also be a bursitis - meloxicam (MOBIC) 15 MG tablet; Take 1 tablet (15 mg total) by mouth daily.  Dispense: 30 tablet; Refill: 0  2. Need for pneumococcal vaccination  - Pneumococcal conjugate vaccine 13-valent IM  3. Early satiety  She has DM and we will check Upper GI series - DG UGI  W/KUB; Future  4.  Abnormal weight loss  Check labs if normal upper GI  - DG UGI  W/KUB; Future

## 2015-12-11 ENCOUNTER — Ambulatory Visit
Admission: RE | Admit: 2015-12-11 | Discharge: 2015-12-11 | Disposition: A | Discharge status: Home / Self Care | Payer: BLUE CROSS/BLUE SHIELD | Source: Ambulatory Visit | Attending: Family Medicine | Admitting: Family Medicine

## 2015-12-11 DIAGNOSIS — R634 Abnormal weight loss: Secondary | ICD-10-CM

## 2015-12-11 DIAGNOSIS — R6881 Early satiety: Secondary | ICD-10-CM | POA: Diagnosis not present

## 2015-12-16 ENCOUNTER — Ambulatory Visit (INDEPENDENT_AMBULATORY_CARE_PROVIDER_SITE_OTHER): Payer: BLUE CROSS/BLUE SHIELD | Admitting: Family Medicine

## 2015-12-16 ENCOUNTER — Encounter: Payer: Self-pay | Admitting: Family Medicine

## 2015-12-16 VITALS — BP 122/68 | HR 106 | Temp 98.9°F | Resp 18 | Ht 63.0 in | Wt 176.7 lb

## 2015-12-16 DIAGNOSIS — N183 Chronic kidney disease, stage 3 unspecified: Secondary | ICD-10-CM | POA: Insufficient documentation

## 2015-12-16 DIAGNOSIS — E1143 Type 2 diabetes mellitus with diabetic autonomic (poly)neuropathy: Secondary | ICD-10-CM | POA: Diagnosis not present

## 2015-12-16 DIAGNOSIS — E1122 Type 2 diabetes mellitus with diabetic chronic kidney disease: Secondary | ICD-10-CM | POA: Insufficient documentation

## 2015-12-16 DIAGNOSIS — G629 Polyneuropathy, unspecified: Secondary | ICD-10-CM

## 2015-12-16 MED ORDER — METOCLOPRAMIDE HCL 5 MG/5ML PO SOLN: 10.0000 mg | Freq: Three times a day (TID) | ORAL | Status: DC

## 2015-12-16 NOTE — Progress Notes (Signed)
Name: Brandy Johnston   MRN: [id number]    DOB: 1961-07-26   Date:12/16/2015       Progress Note  Subjective  Chief Complaint  Chief Complaint  Patient presents with  . Results    Upper GI series with KUB results: Abnormal  . Numbness    onset 1 week in hand, legs and face     HPI   Early satiety: she has noticed that over the past 3 months she eats  small meals and feels full for the entire day. No dysphagia. No chocking. She states after she eats she feels full all day. Last night only able to eat a few bites of fish and was full. . She had lost 9 lbs since the previous visit, but weight is up 2 lbs today.  No fever, no chills, no change in bowel movements or blood in the stools. She had an upper GI study and it showed delayed emptying and mild gastritis. Since she has DM likely diabetic gastroparesis. Her hgbA1C has been at goal, but is due for repeat in 2 days.   Neuropathy: she has a history of diabetic neuropathy, but states that for the past week she has been having severe pain/tingling and numbness on her legs, worse when she tries to use the bathroom to void. Also has noticed cramping/fingers locking, face feels tight for the past week. She is very concerned about it. Only able to take Neurontin at bed time because it causes sedation.    Patient Active Problem List   Diagnosis Date Noted  . Well controlled type 2 diabetes mellitus with gastroparesis (HCC) 12/16/2015  . Diabetic neuropathy associated with type 2 diabetes mellitus (HCC) 09/18/2015  . Insomnia 09/18/2015  . Benign essential HTN 06/18/2015  . Chronic kidney disease (CKD), stage III (moderate) 06/18/2015  . Cramps of lower extremity 06/18/2015  . Diabetes mellitus with renal manifestation (HCC) 06/18/2015  . Abnormal serum level of alkaline phosphatase 06/18/2015  . Neuritis or radiculitis due to rupture of lumbar intervertebral disc 06/18/2015  . Obesity (BMI 30.0-34.9) 06/18/2015  . Degenerative arthritis of hip  06/18/2015  . Sickle cell trait (HCC) 06/18/2015  . Dyslipidemia 05/27/2010  . Gastro-esophageal reflux disease without esophagitis 05/25/2008    Past Surgical History  Procedure Laterality Date  . Cholecystectomy    . Tubal ligation    . Fracture surgery Left     cast and pins   . Colonoscopy      Family History  Problem Relation Age of Onset  . Migraines Mother   . Diabetes Mother   . Cancer Mother     Breast  . Arthritis Brother   . Cancer Maternal Aunt     Breast  . Breast cancer Maternal Aunt   . Cancer Maternal Uncle     Lung and Colon  . Cirrhosis Brother     Social History   Social History  . Marital Status: Single    Spouse Name: N/A  . Number of Children: N/A  . Years of Education: N/A   Occupational History  . Not on file.   Social History Main Topics  . Smoking status: Never Smoker   . Smokeless tobacco: Never Used  . Alcohol Use: No  . Drug Use: No  . Sexual Activity: Yes   Other Topics Concern  . Not on file   Social History Narrative     Current outpatient prescriptions:  .  amLODipine-valsartan (EXFORGE) 5-160 MG per tablet, Take 1 tablet  by mouth daily., Disp: 90 tablet, Rfl: 1 .  aspirin 81 MG chewable tablet, Chew 1 tablet by mouth as needed., Disp: , Rfl:  .  atorvastatin (LIPITOR) 40 MG tablet, Take 1 tablet (40 mg total) by mouth every evening., Disp: 90 tablet, Rfl: 1 .  Butalbital-APAP-Caffeine 50-300-40 MG CAPS, Take 1 capsule by mouth every 6 (six) hours as needed., Disp: 40 capsule, Rfl: 0 .  cyclobenzaprine (FLEXERIL) 5 MG tablet, Take 1 tablet (5 mg total) by mouth every 8 (eight) hours as needed for muscle spasms., Disp: 90 tablet, Rfl: 2 .  gabapentin (NEURONTIN) 300 MG capsule, Take 1 capsule (300 mg total) by mouth 2 (two) times daily., Disp: 180 capsule, Rfl: 0 .  GLUCOSE BLOOD VI, , Disp: , Rfl:  .  losartan-hydrochlorothiazide (HYZAAR) 100-25 MG tablet, , Disp: , Rfl:  .  magnesium oxide (MAG-OX) 400 MG tablet, Take 1  tablet by mouth 2 (two) times daily., Disp: , Rfl:  .  meloxicam (MOBIC) 15 MG tablet, Take 1 tablet (15 mg total) by mouth daily., Disp: 30 tablet, Rfl: 0 .  metFORMIN (GLUCOPHAGE) 850 MG tablet, take 1 tablet by mouth every evening, Disp: 30 tablet, Rfl: 2 .  omeprazole (PRILOSEC) 20 MG capsule, take 1 capsule by mouth every morning, Disp: 30 capsule, Rfl: 6 .  temazepam (RESTORIL) 15 MG capsule, Take 1 capsule (15 mg total) by mouth as needed., Disp: 30 capsule, Rfl: 2 .  traMADol (ULTRAM) 50 MG tablet, Take 1 tablet (50 mg total) by mouth every 12 (twelve) hours as needed. For pain, Disp: 60 tablet, Rfl: 2 .  Vitamin D, Ergocalciferol, (DRISDOL) 50000 UNITS CAPS capsule, Take 1 capsule (50,000 Units total) by mouth once a week., Disp: 30 capsule, Rfl: 0 .  metoCLOPramide (REGLAN) 5 MG/5ML solution, Take 10 mLs (10 mg total) by mouth 4 (four) times daily -  before meals and at bedtime., Disp: 120 mL, Rfl: 0  No Known Allergies   ROS  Constitutional: Negative for fever or significant weight change.  Respiratory: Negative for cough and shortness of breath.   Cardiovascular: Negative for chest pain or palpitations.  Gastrointestinal: Negative for abdominal pain, no bowel changes.  Musculoskeletal: Negative for gait problem or joint swelling.  Skin: Negative for rash.  Neurological: Negative for dizziness, but feels off balance at time  or headache.  No other specific complaints in a complete review of systems (except as listed in HPI above).   Objective  Filed Vitals:   12/16/15 1048  BP: 122/68  Pulse: 106  Temp: 98.9 F (37.2 C)  TempSrc: Oral  Resp: 18  Height: 5\' 3"  (1.6 m)  Weight: 176 lb 11.2 oz (80.151 kg)  SpO2: 98%    Body mass index is 31.31 kg/(m^2).  Physical Exam  Constitutional: Patient appears well-developed and well-nourished. Obese  No distress.  HEENT: head atraumatic, normocephalic, pupils equal and reactive to light, neck supple, throat within normal  limits Cardiovascular: Normal rate, regular rhythm and normal heart sounds.  No murmur heard. No BLE edema. Pulmonary/Chest: Effort normal and breath sounds normal. No respiratory distress. Abdominal: Soft.  There is no tenderness. Psychiatric: Patient has a normal mood and affect. behavior is normal. Judgment and thought content normal. Neurological :   Recent Results (from the past 2160 hour(s))  POCT HgB A1C     Status: None   Collection Time: 09/18/15  9:59 AM  Result Value Ref Range   Hemoglobin A1C 6.4   Comprehensive metabolic panel  Status: Abnormal   Collection Time: 09/18/15 11:15 AM  Result Value Ref Range   Glucose 90 65 - 99 mg/dL   BUN 11 6 - 24 mg/dL   Creatinine, Ser 4.09 (H) 0.57 - 1.00 mg/dL   GFR calc non Af Amer 60 >59 mL/min/1.73   GFR calc Af Amer 69 >59 mL/min/1.73   BUN/Creatinine Ratio 11 9 - 23   Sodium 143 134 - 144 mmol/L   Potassium 3.8 3.5 - 5.2 mmol/L   Chloride 101 97 - 108 mmol/L   CO2 23 18 - 29 mmol/L   Calcium 7.8 (L) 8.7 - 10.2 mg/dL   Total Protein 7.9 6.0 - 8.5 g/dL   Albumin 4.2 3.5 - 5.5 g/dL   Globulin, Total 3.7 1.5 - 4.5 g/dL   Albumin/Globulin Ratio 1.1 1.1 - 2.5   Bilirubin Total 0.3 0.0 - 1.2 mg/dL   Alkaline Phosphatase 145 (H) 39 - 117 IU/L   AST 17 0 - 40 IU/L   ALT 14 0 - 32 IU/L  Vitamin B12     Status: None   Collection Time: 09/18/15 11:15 AM  Result Value Ref Range   Vitamin B-12 664 211 - 946 pg/mL     PHQ2/9: Depression screen [facility] 2/9 12/03/2015 06/18/2015  Decreased Interest 0 0  Down, Depressed, Hopeless 0 0  PHQ - 2 Score 0 0    Fall Risk: Fall Risk  12/03/2015 06/18/2015  Falls in the past year? No No     Assessment & Plan  1. Well controlled type 2 diabetes mellitus with gastroparesis (HCC)  - metoCLOPramide (REGLAN) 5 MG/5ML solution; Take 10 mLs (10 mg total) by mouth 4 (four) times daily -  before meals and at bedtime.  Dispense: 120 mL; Refill: 0 - Hemoglobin A1c  2. Hypocalcemia  -  Calcium, ionized - Magnesium  3. Neuropathy (HCC)  - Ambulatory referral to Neurology

## 2015-12-17 ENCOUNTER — Other Ambulatory Visit: Payer: Self-pay | Admitting: Family Medicine

## 2015-12-17 DIAGNOSIS — R790 Abnormal level of blood mineral: Secondary | ICD-10-CM

## 2015-12-17 LAB — HEMOGLOBIN A1C
Est. average glucose Bld gHb Est-mCnc: 134 mg/dL
Hgb A1c MFr Bld: 6.3 % — ABNORMAL HIGH (ref 4.8–5.6)

## 2015-12-17 LAB — MAGNESIUM: Magnesium: 0.8 mg/dL — ABNORMAL LOW (ref 1.6–2.3)

## 2015-12-17 LAB — CALCIUM, IONIZED: Calcium, Ion: 3.1 mg/dL — ABNORMAL LOW (ref 4.5–5.6)

## 2015-12-18 ENCOUNTER — Telehealth: Payer: Self-pay

## 2015-12-18 NOTE — Telephone Encounter (Signed)
Per the request of Dr. Carlynn PurlSowles, I called [facility] to add on test [phone] with DX: E83.51 & E83.42.  I spoke with Kyla Balzarineatiana and she stated they will see if it could be added on and if so a fax will be sent to our office that requires Dr. Carlynn PurlSowles signature in order for them to proceed with the additional testing.

## 2015-12-19 ENCOUNTER — Other Ambulatory Visit: Payer: Self-pay | Admitting: Family Medicine

## 2015-12-19 DIAGNOSIS — R790 Abnormal level of blood mineral: Secondary | ICD-10-CM

## 2015-12-20 LAB — PTH, INTACT AND CALCIUM
Calcium: 7.1 mg/dL — ABNORMAL LOW (ref 8.7–10.2)
PTH: 40 pg/mL (ref 15–65)

## 2015-12-20 LAB — MAGNESIUM: Magnesium: 0.9 mg/dL — ABNORMAL LOW (ref 1.6–2.3)

## 2015-12-27 ENCOUNTER — Other Ambulatory Visit: Payer: Self-pay | Admitting: Family Medicine

## 2015-12-27 NOTE — Telephone Encounter (Signed)
Patient requesting refill. 

## 2015-12-31 ENCOUNTER — Telehealth: Payer: Self-pay | Admitting: Family Medicine

## 2015-12-31 ENCOUNTER — Telehealth: Payer: Self-pay

## 2015-12-31 NOTE — Telephone Encounter (Signed)
Ready for pick up

## 2015-12-31 NOTE — Telephone Encounter (Signed)
Patient informed and will pick up today

## 2015-12-31 NOTE — Telephone Encounter (Signed)
Patient called wanting to know why she had to go to the Endocrinologist. I informed her it was due to the low calcium. She when on to say that her appt is today but that she could not see the neurologist until the 15th. She was told that in order for her to get an earlier appt Dr. Carlynn PurlSowles would have to call stating it was urgent.  Patient mentioned that since taking the magnesium her numbness has decreased and she had not had anymore tightness of face or legs.

## 2015-12-31 NOTE — Telephone Encounter (Signed)
Patient has appointment today with [facility] and would like a copy of her Medication List. She would like to pick this up before she go to her appointment at 1 pm today. Please call once ready

## 2016-01-03 ENCOUNTER — Ambulatory Visit: Payer: BLUE CROSS/BLUE SHIELD | Admitting: Family Medicine

## 2016-01-08 ENCOUNTER — Other Ambulatory Visit: Payer: Self-pay | Admitting: Family Medicine

## 2016-01-09 ENCOUNTER — Telehealth: Payer: Self-pay

## 2016-01-09 NOTE — Telephone Encounter (Signed)
Patient was informed of Dr. Carlynn PurlSowles message (to follow the instructions of her Endocrinologist and to inform them of her sx/ concerns) via voicemail. Patient was encouraged to give us a call back if she had any additional questions.

## 2016-01-09 NOTE — Telephone Encounter (Signed)
She needs to take the medication as instructed by Endo, likely still going to take a couple of days to get in her system

## 2016-01-09 NOTE — Telephone Encounter (Signed)
Patient stated that she was taken off of the calcium and magnesium since her labs looked good but when the endocrinologist got the results in from yesterday's blood draw it showed that her magnesium was low again. They sent her in (this morning) a new rx for Magnesium 400mg  BID and will see her again on 01/16/16 to re-check her labs.   Patient stated that her cramping has restarted last night and wanted to know what to do. She has a follow-up appt with Dr. Carlynn PurlSowles on 01/14/16 @ 11:20am

## 2016-01-14 ENCOUNTER — Encounter: Payer: Self-pay | Admitting: Family Medicine

## 2016-01-14 ENCOUNTER — Ambulatory Visit (INDEPENDENT_AMBULATORY_CARE_PROVIDER_SITE_OTHER): Payer: BLUE CROSS/BLUE SHIELD | Admitting: Family Medicine

## 2016-01-14 VITALS — BP 122/70 | HR 97 | Temp 98.8°F | Resp 18 | Wt 177.1 lb

## 2016-01-14 DIAGNOSIS — E1122 Type 2 diabetes mellitus with diabetic chronic kidney disease: Secondary | ICD-10-CM

## 2016-01-14 DIAGNOSIS — G629 Polyneuropathy, unspecified: Secondary | ICD-10-CM | POA: Diagnosis not present

## 2016-01-14 DIAGNOSIS — R790 Abnormal level of blood mineral: Secondary | ICD-10-CM

## 2016-01-14 DIAGNOSIS — N183 Chronic kidney disease, stage 3 unspecified: Secondary | ICD-10-CM

## 2016-01-14 DIAGNOSIS — K219 Gastro-esophageal reflux disease without esophagitis: Secondary | ICD-10-CM | POA: Diagnosis not present

## 2016-01-14 DIAGNOSIS — M5116 Intervertebral disc disorders with radiculopathy, lumbar region: Secondary | ICD-10-CM | POA: Diagnosis not present

## 2016-01-14 DIAGNOSIS — G4700 Insomnia, unspecified: Secondary | ICD-10-CM

## 2016-01-14 DIAGNOSIS — I10 Essential (primary) hypertension: Secondary | ICD-10-CM

## 2016-01-14 DIAGNOSIS — E1142 Type 2 diabetes mellitus with diabetic polyneuropathy: Secondary | ICD-10-CM

## 2016-01-14 DIAGNOSIS — E785 Hyperlipidemia, unspecified: Secondary | ICD-10-CM

## 2016-01-14 MED ORDER — TRAMADOL HCL 50 MG PO TABS: 50.0000 mg | ORAL_TABLET | Freq: Two times a day (BID) | ORAL | Status: DC | PRN

## 2016-01-14 MED ORDER — TEMAZEPAM 15 MG PO CAPS: 15.0000 mg | ORAL_CAPSULE | ORAL | Status: DC | PRN

## 2016-01-14 MED ORDER — AMLODIPINE BESYLATE-VALSARTAN 5-160 MG PO TABS: 1.0000 | ORAL_TABLET | Freq: Every day | ORAL | Status: DC

## 2016-01-14 MED ORDER — METFORMIN HCL 850 MG PO TABS: 850.0000 mg | ORAL_TABLET | Freq: Every evening | ORAL | Status: DC

## 2016-01-14 MED ORDER — LOSARTAN POTASSIUM-HCTZ 100-25 MG PO TABS: 1.0000 | ORAL_TABLET | Freq: Every day | ORAL | Status: DC

## 2016-01-14 MED ORDER — RANITIDINE HCL 150 MG PO CAPS: 150.0000 mg | ORAL_CAPSULE | Freq: Two times a day (BID) | ORAL | Status: DC

## 2016-01-14 MED ORDER — GABAPENTIN 300 MG PO CAPS: 300.0000 mg | ORAL_CAPSULE | Freq: Two times a day (BID) | ORAL | Status: DC

## 2016-01-14 NOTE — Progress Notes (Signed)
Name: Brandy Johnston   MRN: [id number]    DOB: [birth year]   Date:01/14/2016       Progress Note  Subjective  Chief Complaint  Chief Complaint  Patient presents with  . Follow-up    patient is here for a 76-month re-check. cramps has improved    HPI  DMII with renal manifestation, gastroparesis and neuropathy. States now finger tips and toes are always tingling and is bothersome but not painful. Glucose at home has been around 80's-110's. She has sporadic polyphagia, polydipsia but no polyuria. She is compliant with Metformin. Also on Reglan that she take prn before meals. Gabapentin for neuropathy and symptoms are better controlled, and also on ARB for kidney protection. She is tolerating Gabapentin well now, advised to try increasing to three times daily.   Insomnia: She is taking Temazepam, still waking up at 3 am, but able to fall back asleep. She usually wakes up feeling rested in am  HTN: taking medication bp is at goal , no chest pain , seldom has a flutter sensation in her chest - no pain or diaphoresis or SOB associated with flutters - symptoms lasts a few seconds and resolves by itself.   Hyperlipidemia: taking Lipitor occasionally and denies side effects. Discussed importance of compliance.   GERD: taking Omeprazole and symptoms are under control, denies symptoms when taking medication. Discussed importance of trying to weaning off and stopping it because omeprazole can affect kidney function and decrease calcium absorption, we will try switching to Ranitidine.   Left lumbar radiculitis:no recent flares,The left outer thigh and bottom of left foot stays numb. She had NCS and MRI. She does not want to see Dr. Council Mechanic at this time  Low calcium and magnesium: seen by Endo still taking supplementation, we will stop PPI today, and consider referral to nephrologist if levels remain low.   Patient Active Problem List   Diagnosis Date Noted  . Well controlled type 2 diabetes mellitus  with gastroparesis (HCC) 12/16/2015  . Diabetic neuropathy associated with type 2 diabetes mellitus (HCC) 09/18/2015  . Insomnia 09/18/2015  . Benign essential HTN 06/18/2015  . Chronic kidney disease (CKD), stage III (moderate) 06/18/2015  . Diabetes mellitus with renal manifestation (HCC) 06/18/2015  . Abnormal serum level of alkaline phosphatase 06/18/2015  . Neuritis or radiculitis due to rupture of lumbar intervertebral disc 06/18/2015  . Obesity (BMI 30.0-34.9) 06/18/2015  . Degenerative arthritis of hip 06/18/2015  . Sickle cell trait (HCC) 06/18/2015  . Dyslipidemia 05/27/2010  . Gastro-esophageal reflux disease without esophagitis 05/25/2008    Past Surgical History  Procedure Laterality Date  . Cholecystectomy    . Tubal ligation    . Fracture surgery Left     cast and pins   . Colonoscopy      Family History  Problem Relation Age of Onset  . Migraines Mother   . Diabetes Mother   . Cancer Mother     Breast  . Arthritis Brother   . Cancer Maternal Aunt     Breast  . Breast cancer Maternal Aunt   . Cancer Maternal Uncle     Lung and Colon  . Cirrhosis Brother     Social History   Social History  . Marital Status: Single    Spouse Name: N/A  . Number of Children: N/A  . Years of Education: N/A   Occupational History  . Not on file.   Social History Main Topics  . Smoking status: Never Smoker   .  Smokeless tobacco: Never Used  . Alcohol Use: No  . Drug Use: No  . Sexual Activity: Yes   Other Topics Concern  . Not on file   Social History Narrative     Current outpatient prescriptions:  .  amLODipine-valsartan (EXFORGE) 5-160 MG tablet, Take 1 tablet by mouth daily., Disp: 30 tablet, Rfl: 5 .  aspirin 81 MG chewable tablet, Chew 1 tablet by mouth as needed., Disp: , Rfl:  .  atorvastatin (LIPITOR) 40 MG tablet, Take 1 tablet (40 mg total) by mouth every evening., Disp: 90 tablet, Rfl: 1 .  Butalbital-APAP-Caffeine 50-300-40 MG CAPS, take 1  capsule by mouth every 6 hours if needed, Disp: 40 capsule, Rfl: 0 .  cyclobenzaprine (FLEXERIL) 5 MG tablet, Take 1 tablet (5 mg total) by mouth every 8 (eight) hours as needed for muscle spasms., Disp: 90 tablet, Rfl: 2 .  gabapentin (NEURONTIN) 300 MG capsule, Take 1 capsule (300 mg total) by mouth 2 (two) times daily., Disp: 180 capsule, Rfl: 1 .  GLUCOSE BLOOD VI, , Disp: , Rfl:  .  losartan-hydrochlorothiazide (HYZAAR) 100-25 MG tablet, Take 1 tablet by mouth daily., Disp: 30 tablet, Rfl: 5 .  magnesium oxide (MAG-OX) 400 MG tablet, Take 1 tablet by mouth 2 (two) times daily., Disp: , Rfl:  .  metFORMIN (GLUCOPHAGE) 850 MG tablet, Take 1 tablet (850 mg total) by mouth every evening., Disp: 30 tablet, Rfl: 2 .  metoCLOPramide (REGLAN) 5 MG/5ML solution, Take 10 mLs (10 mg total) by mouth 4 (four) times daily -  before meals and at bedtime., Disp: 120 mL, Rfl: 0 .  temazepam (RESTORIL) 15 MG capsule, Take 1 capsule (15 mg total) by mouth as needed., Disp: 30 capsule, Rfl: 2 .  traMADol (ULTRAM) 50 MG tablet, Take 1 tablet (50 mg total) by mouth every 12 (twelve) hours as needed. For pain, Disp: 60 tablet, Rfl: 2 .  ranitidine (ZANTAC) 150 MG capsule, Take 1 capsule (150 mg total) by mouth 2 (two) times daily., Disp: 60 capsule, Rfl: 2 .  Vitamin D, Ergocalciferol, (DRISDOL) 50000 UNITS CAPS capsule, Take 1 capsule (50,000 Units total) by mouth once a week., Disp: 30 capsule, Rfl: 0  No Known Allergies   ROS  Constitutional: Negative for fever or weight change.  Respiratory: Negative for cough and shortness of breath.   Cardiovascular: Negative for chest pain , very seldom has  palpitations.  Gastrointestinal: Negative for abdominal pain, no bowel changes - still has constipation .  Musculoskeletal: Negative for gait problem or joint swelling.  Skin: Negative for rash.  Neurological: Negative for dizziness , positive for  intermittent  headache.  No other specific complaints in a complete  review of systems (except as listed in HPI above).  Objective  Filed Vitals:   01/14/16 1110  BP: 122/70  Pulse: 97  Temp: 98.8 F (37.1 C)  TempSrc: Oral  Resp: 18  Weight: 177 lb 1.6 oz (80.332 kg)  SpO2: 97%    Body mass index is 31.38 kg/(m^2).  Physical Exam  Constitutional: Patient appears well-developed and well-nourished. Obese  No distress.  HEENT: head atraumatic, normocephalic, pupils equal and reactive to light, neck supple, throat within normal limits Cardiovascular: Normal rate, regular rhythm and normal heart sounds.  No murmur heard. No BLE edema. Pulmonary/Chest: Effort normal and breath sounds normal. No respiratory distress. Abdominal: Soft.  There is no tenderness. Psychiatric: Patient has a normal mood and affect. behavior is normal. Judgment and thought content normal.  Recent Results (from the past 2160 hour(s))  Calcium, ionized     Status: Abnormal   Collection Time: 12/16/15 11:45 AM  Result Value Ref Range   Calcium, Ion 3.1 (L) 4.5 - 5.6 mg/dL  Hemoglobin Z6X     Status: Abnormal   Collection Time: 12/16/15 11:45 AM  Result Value Ref Range   Hgb A1c MFr Bld 6.3 (H) 4.8 - 5.6 %    Comment:          Pre-diabetes: 5.7 - 6.4          Diabetes: >6.4          Glycemic control for adults with diabetes: <7.0    Est. average glucose Bld gHb Est-mCnc 134 mg/dL  Magnesium     Status: Abnormal   Collection Time: 12/16/15 11:45 AM  Result Value Ref Range   Magnesium 0.8 (L) 1.6 - 2.3 mg/dL  PTH, intact and calcium     Status: Abnormal   Collection Time: 12/19/15  2:31 PM  Result Value Ref Range   Calcium 7.1 (L) 8.7 - 10.2 mg/dL   PTH 40 15 - 65 pg/mL   PTH Comment     Comment: Interpretation                 Intact PTH    Calcium                                 (pg/mL)      (mg/dL) Normal                          15 - 65     8.6 - 10.2 Primary Hyperparathyroidism         >65          >10.2 Secondary Hyperparathyroidism       >65           <10.2 Non-Parathyroid Hypercalcemia       <65          >10.2 Hypoparathyroidism                  <15          < 8.6 Non-Parathyroid Hypocalcemia    15 - 65          < 8.6   Magnesium     Status: Abnormal   Collection Time: 12/19/15  2:31 PM  Result Value Ref Range   Magnesium 0.9 (L) 1.6 - 2.3 mg/dL     WRU0/4: Depression screen [facility] 2/9 01/14/2016 12/03/2015 06/18/2015  Decreased Interest 0 0 0  Down, Depressed, Hopeless 0 0 0  PHQ - 2 Score 0 0 0    Fall Risk: Fall Risk  01/14/2016 12/03/2015 06/18/2015  Falls in the past year? No No No     Functional Status Survey: Is the patient deaf or have difficulty hearing?: No Does the patient have difficulty seeing, even when wearing glasses/contacts?: Yes (contacts/glasses) Does the patient have difficulty concentrating, remembering, or making decisions?: No Does the patient have difficulty walking or climbing stairs?: No Does the patient have difficulty dressing or bathing?: No Does the patient have difficulty doing errands alone such as visiting a doctor's office or shopping?: No   Assessment & Plan  1. Chronic kidney disease (CKD), stage III (moderate)   stop Meloxicam and Omeprazole and monitor function   2. Type 2 diabetes  mellitus with stage 3 chronic kidney disease, without long-term current use of insulin (HCC)  - metFORMIN (GLUCOPHAGE) 850 MG tablet; Take 1 tablet (850 mg total) by mouth every evening.  Dispense: 30 tablet; Refill: 2  3. Benign essential HTN  - losartan-hydrochlorothiazide (HYZAAR) 100-25 MG tablet; Take 1 tablet by mouth daily.  Dispense: 30 tablet; Refill: 5 - amLODipine-valsartan (EXFORGE) 5-160 MG tablet; Take 1 tablet by mouth daily.  Dispense: 30 tablet; Refill: 5  4. Dyslipidemia  Needs to take medication daily  5. Hypocalcemia  Continue follow up with Endo and may need to see Nephrologist   6. Low magnesium levels  Seeing Endo, taking supplementation, cramps have resolved  7. Neuritis  or radiculitis due to rupture of lumbar intervertebral disc  Try increasing Gabapentin to three times daily  - traMADol (ULTRAM) 50 MG tablet; Take 1 tablet (50 mg total) by mouth every 12 (twelve) hours as needed. For pain  Dispense: 60 tablet; Refill: 2 - gabapentin (NEURONTIN) 300 MG capsule; Take 1 capsule (300 mg total) by mouth 2 (two) times daily.  Dispense: 180 capsule; Refill: 1  8. Neuropathy (HCC)  See above  9. Insomnia  - temazepam (RESTORIL) 15 MG capsule; Take 1 capsule (15 mg total) by mouth as needed.  Dispense: 30 capsule; Refill: 2  10. Diabetic polyneuropathy associated with type 2 diabetes mellitus (HCC)  - metFORMIN (GLUCOPHAGE) 850 MG tablet; Take 1 tablet (850 mg total) by mouth every evening.  Dispense: 30 tablet; Refill: 2 - gabapentin (NEURONTIN) 300 MG capsule; Take 1 capsule (300 mg total) by mouth 2 (two) times daily.  Dispense: 180 capsule; Refill: 1  11. Gastro-esophageal reflux disease without esophagitis  - ranitidine (ZANTAC) 150 MG capsule; Take 1 capsule (150 mg total) by mouth 2 (two) times daily.  Dispense: 60 capsule; Refill: 2

## 2016-03-02 ENCOUNTER — Other Ambulatory Visit: Payer: Self-pay | Admitting: Family Medicine

## 2016-03-02 NOTE — Telephone Encounter (Signed)
Patient requesting refill. 

## 2016-03-19 ENCOUNTER — Other Ambulatory Visit: Payer: Self-pay | Admitting: Family Medicine

## 2016-03-19 NOTE — Telephone Encounter (Signed)
Patient requesting refill. 

## 2016-04-21 ENCOUNTER — Ambulatory Visit (INDEPENDENT_AMBULATORY_CARE_PROVIDER_SITE_OTHER): Payer: BLUE CROSS/BLUE SHIELD | Admitting: Family Medicine

## 2016-04-21 ENCOUNTER — Encounter: Payer: Self-pay | Admitting: Family Medicine

## 2016-04-21 ENCOUNTER — Other Ambulatory Visit: Payer: Self-pay

## 2016-04-21 VITALS — BP 116/64 | HR 91 | Temp 98.1°F | Resp 16 | Ht 63.0 in | Wt 182.8 lb

## 2016-04-21 DIAGNOSIS — M5116 Intervertebral disc disorders with radiculopathy, lumbar region: Secondary | ICD-10-CM

## 2016-04-21 DIAGNOSIS — G43009 Migraine without aura, not intractable, without status migrainosus: Secondary | ICD-10-CM | POA: Diagnosis not present

## 2016-04-21 DIAGNOSIS — N183 Chronic kidney disease, stage 3 unspecified: Secondary | ICD-10-CM

## 2016-04-21 DIAGNOSIS — M25512 Pain in left shoulder: Secondary | ICD-10-CM

## 2016-04-21 DIAGNOSIS — R790 Abnormal level of blood mineral: Secondary | ICD-10-CM

## 2016-04-21 DIAGNOSIS — M25511 Pain in right shoulder: Secondary | ICD-10-CM

## 2016-04-21 DIAGNOSIS — I10 Essential (primary) hypertension: Secondary | ICD-10-CM

## 2016-04-21 DIAGNOSIS — G4700 Insomnia, unspecified: Secondary | ICD-10-CM | POA: Diagnosis not present

## 2016-04-21 DIAGNOSIS — E1122 Type 2 diabetes mellitus with diabetic chronic kidney disease: Secondary | ICD-10-CM

## 2016-04-21 DIAGNOSIS — E1142 Type 2 diabetes mellitus with diabetic polyneuropathy: Secondary | ICD-10-CM | POA: Diagnosis not present

## 2016-04-21 DIAGNOSIS — E785 Hyperlipidemia, unspecified: Secondary | ICD-10-CM | POA: Diagnosis not present

## 2016-04-21 DIAGNOSIS — E1143 Type 2 diabetes mellitus with diabetic autonomic (poly)neuropathy: Secondary | ICD-10-CM | POA: Diagnosis not present

## 2016-04-21 LAB — POCT UA - MICROALBUMIN: Microalbumin Ur, POC: 20 mg/L

## 2016-04-21 LAB — POCT GLYCOSYLATED HEMOGLOBIN (HGB A1C): Hemoglobin A1C: 6.4

## 2016-04-21 MED ORDER — RANITIDINE HCL 150 MG PO TABS: 150.0000 mg | ORAL_TABLET | Freq: Two times a day (BID) | ORAL | Status: DC

## 2016-04-21 MED ORDER — BUTALBITAL-APAP-CAFFEINE 50-300-40 MG PO CAPS: ORAL_CAPSULE | ORAL | Status: DC

## 2016-04-21 MED ORDER — MELOXICAM 15 MG PO TABS: 15.0000 mg | ORAL_TABLET | Freq: Every day | ORAL | Status: DC

## 2016-04-21 MED ORDER — TEMAZEPAM 15 MG PO CAPS: 15.0000 mg | ORAL_CAPSULE | ORAL | Status: DC | PRN

## 2016-04-21 MED ORDER — METFORMIN HCL 850 MG PO TABS: 850.0000 mg | ORAL_TABLET | Freq: Every evening | ORAL | Status: DC

## 2016-04-21 MED ORDER — ATORVASTATIN CALCIUM 40 MG PO TABS: 40.0000 mg | ORAL_TABLET | Freq: Every evening | ORAL | Status: DC

## 2016-04-21 MED ORDER — TRAMADOL HCL 50 MG PO TABS: 50.0000 mg | ORAL_TABLET | Freq: Two times a day (BID) | ORAL | Status: DC | PRN

## 2016-04-21 NOTE — Progress Notes (Signed)
Name: Brandy Johnston   MRN: [id number]    DOB: 06-10-1961   Date:04/21/2016       Progress Note  Subjective  Chief Complaint  Chief Complaint  Patient presents with  . Medication Refill    3 month F/U  . Hypertension    headaches due to stress, checks [facility] at [facility] and always reads normal  . Diabetes    Checks every so often, Low-80 High-120  . Hyperlipidemia    Cramps are decreasing  . Migraine    2x a week due to job stress, takes medication prn  . Gastroesophageal Reflux    Well controlled w daily regimen  . Shoulder Pain    Onset-months on her right shoulder, stays her work consist of a lot of pulling and pushing and her shoulder has been aggraveted again. The medication you prescribed before helped, but was told she can not be on it long term. Patient states the pain is constant and unable to leave her arm up very high due to the pain.    HPI   DMII with renal manifestation, gastroparesis and neuropathy. States now finger tips and toes are always tingling and is bothersome but not painful. Glucose at home has been around 80's-120's. Denies  polyphagia, polydipsia or  polyuria. She is compliant with Metformin. Also on Reglan that she take prn before meals. Gabapentin for neuropathy and symptoms are better controlled, and also on ARB for kidney protection. She is tolerating Gabapentin well now, she has been unable to tolerate TID when working, usually taking twice daily   Insomnia: She is taking Temazepam, still waking up at 3 am, but able to fall back asleep. She usually wakes up feeling rested in am. She likes Temazepam   HTN: taking medication bp is at goal , no chest pain , seldom has a flutter sensation in her chest, but not recently  - no pain or diaphoresis or SOB associated with flutters - symptoms lasts a few seconds and resolves by itself.   Hyperlipidemia: taking Lipitor occasionally and denies side effects. Discussed importance of compliance. Due for labs now  GERD: taking  Omeprazole and symptoms are under control, denies symptoms when taking medication. Discussed importance of trying to weaning off and stopping it because omeprazole can affect kidney function and decrease calcium absorption, we will try switching to Ranitidine.   Left lumbar radiculitis:no recent flares,The left outer thigh and bottom of left foot stays numb. She had NCS and MRI. She does not want to see Dr. Council Mechanic at this time or get PT because of cost.   Low calcium and magnesium: seen by Endo still taking supplementation, we were supposed to stop Omeprazole and start Ranitidine, but it never happened. We will make sure pharmacy stops filling Omeprazole   Bilateral shoulder pain: symptoms started about 9 months ago and is getting progressively worse. 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 Pain is described as a soreness on both shoulder, worse on anterior right shoulder. Also with movement of shoulder, such as abducting or internal rotation. Pain is sharp with movement. No rashes, no swelling or redness. Not taking any nsaid's at this time but worked well for her in the past.   Migraine: she has episodes seldom, usually temporal, sharp, associated with photophobia, but no phonophobia. No nausea or vomiting. She takes prn Fioricet  Patient Active Problem List   Diagnosis Date Noted  . Well controlled type 2 diabetes mellitus with  gastroparesis (HCC) 12/16/2015  . Diabetic neuropathy associated with type 2 diabetes mellitus (HCC) 09/18/2015  . Insomnia 09/18/2015  . Benign essential HTN 06/18/2015  . Chronic kidney disease (CKD), stage III (moderate) 06/18/2015  . Diabetes mellitus with renal manifestation (HCC) 06/18/2015  . Abnormal serum level of alkaline phosphatase 06/18/2015  . Neuritis or radiculitis due to rupture of lumbar intervertebral disc 06/18/2015  . Obesity (BMI 30.0-34.9) 06/18/2015  . Degenerative arthritis of hip 06/18/2015  .  Sickle cell trait (HCC) 06/18/2015  . Dyslipidemia 05/27/2010  . Gastro-esophageal reflux disease without esophagitis 05/25/2008    Past Surgical History  Procedure Laterality Date  . Cholecystectomy    . Tubal ligation    . Fracture surgery Left     cast and pins   . Colonoscopy      Family History  Problem Relation Age of Onset  . Migraines Mother   . Diabetes Mother   . Cancer Mother     Breast  . Arthritis Brother   . Cancer Maternal Aunt     Breast  . Breast cancer Maternal Aunt   . Cancer Maternal Uncle     Lung and Colon  . Cirrhosis Brother     Social History   Social History  . Marital Status: Single    Spouse Name: N/A  . Number of Children: N/A  . Years of Education: N/A   Occupational History  . Not on file.   Social History Main Topics  . Smoking status: Never Smoker   . Smokeless tobacco: Never Used  . Alcohol Use: No  . Drug Use: No  . Sexual Activity: Yes   Other Topics Concern  . Not on file   Social History Narrative     Current outpatient prescriptions:  .  amLODipine-valsartan (EXFORGE) 5-160 MG tablet, Take 1 tablet by mouth daily., Disp: 30 tablet, Rfl: 5 .  aspirin 81 MG chewable tablet, Chew 1 tablet by mouth as needed., Disp: , Rfl:  .  atorvastatin (LIPITOR) 40 MG tablet, Take 1 tablet (40 mg total) by mouth every evening., Disp: 90 tablet, Rfl: 1 .  Butalbital-APAP-Caffeine 50-300-40 MG CAPS, take 1 capsule by mouth every 6 hours if needed, Disp: 40 capsule, Rfl: 0 .  gabapentin (NEURONTIN) 300 MG capsule, Take 1 capsule (300 mg total) by mouth 2 (two) times daily., Disp: 180 capsule, Rfl: 1 .  GLUCOSE BLOOD VI, , Disp: , Rfl:  .  losartan-hydrochlorothiazide (HYZAAR) 100-25 MG tablet, Take 1 tablet by mouth daily., Disp: 30 tablet, Rfl: 5 .  magnesium oxide (MAG-OX) 400 MG tablet, Take 1 tablet by mouth 2 (two) times daily., Disp: , Rfl:  .  metFORMIN (GLUCOPHAGE) 850 MG tablet, Take 1 tablet (850 mg total) by mouth every  evening., Disp: 30 tablet, Rfl: 2 .  metoCLOPramide (REGLAN) 5 MG/5ML solution, Take 10 mLs (10 mg total) by mouth 4 (four) times daily -  before meals and at bedtime., Disp: 120 mL, Rfl: 0 .  ranitidine (ZANTAC) 150 MG capsule, Take 1 capsule (150 mg total) by mouth 2 (two) times daily., Disp: 60 capsule, Rfl: 2 .  temazepam (RESTORIL) 15 MG capsule, Take 1 capsule (15 mg total) by mouth as needed., Disp: 30 capsule, Rfl: 2 .  traMADol (ULTRAM) 50 MG tablet, Take 1 tablet (50 mg total) by mouth every 12 (twelve) hours as needed. For pain, Disp: 60 tablet, Rfl: 2 .  Vitamin D, Ergocalciferol, (DRISDOL) 50000 UNITS CAPS capsule, Take 1 capsule (50,000 Units  total) by mouth once a week., Disp: 30 capsule, Rfl: 0 .  meloxicam (MOBIC) 15 MG tablet, Take 1 tablet (15 mg total) by mouth daily., Disp: 30 tablet, Rfl: 2  No Known Allergies   ROS  Constitutional: Negative for fever or weight change.  Respiratory: Negative for cough and shortness of breath.   Cardiovascular: Negative for chest pain or palpitations.  Gastrointestinal: Negative for abdominal pain, no bowel changes.  Musculoskeletal: Negative for gait problem or joint swelling.  Skin: Negative for rash.  Neurological: Negative for dizziness or headache.  No other specific complaints in a complete review of systems (except as listed in HPI above).  Objective  Filed Vitals:   04/21/16 0900  BP: 116/64  Pulse: 91  Temp: 98.1 F (36.7 C)  TempSrc: Oral  Resp: 16  Height:  (1.6 m)  Weight: 182 lb 12.8 oz (82.918 kg)  SpO2: 96%    Body mass index is 32.39 kg/(m^2).  Physical Exam  Constitutional: Patient appears well-developed and well-nourished. Obese  No distress.  HEENT: head atraumatic, normocephalic, pupils equal and reactive to light, neck supple, throat within normal limits Cardiovascular: Normal rate, regular rhythm and normal heart sounds.  No murmur heard. No BLE edema. Pulmonary/Chest: Effort normal and  breath sounds normal. No respiratory distress. Abdominal: Soft.  There is no tenderness. Psychiatric: Patient has a normal mood and affect. behavior is normal. Judgment and thought content normal. Muscular Skeletal: pain during palpation of posterior shoulder, impingement sign bilaterally   Recent Results (from the past 2160 hour(s))  POCT HgB A1C     Status: None   Collection Time: 04/21/16  9:04 AM  Result Value Ref Range   Hemoglobin A1C 6.4   POCT UA - Microalbumin     Status: None   Collection Time: 04/21/16  9:09 AM  Result Value Ref Range   Microalbumin Ur, POC 20 mg/L   Creatinine, POC  mg/dL   Albumin/Creatinine Ratio, Urine, POC       PHQ2/9: Depression screen [facility] 2/9 04/21/2016 01/14/2016 12/03/2015 06/18/2015  Decreased Interest 0 0 0 0  Down, Depressed, Hopeless 0 0 0 0  PHQ - 2 Score 0 0 0 0    Fall Risk: Fall Risk  04/21/2016 01/14/2016 12/03/2015 06/18/2015  Falls in the past year? No No No No      Functional Status Survey: Is the patient deaf or have difficulty hearing?: No Does the patient have difficulty seeing, even when wearing glasses/contacts?: No Does the patient have difficulty concentrating, remembering, or making decisions?: No Does the patient have difficulty walking or climbing stairs?: No Does the patient have difficulty dressing or bathing?: No Does the patient have difficulty doing errands alone such as visiting a doctor's office or shopping?: No    Assessment & Plan  1. Well controlled type 2 diabetes mellitus with gastroparesis (HCC)  - POCT HgB A1C - POCT UA - Microalbumin  2. Diabetic polyneuropathy associated with type 2 diabetes mellitus (HCC)  - metFORMIN (GLUCOPHAGE) 850 MG tablet; Take 1 tablet (850 mg total) by mouth every evening.  Dispense: 30 tablet; Refill: 2 - Comprehensive metabolic panel  3. Low magnesium levels  - Magnesium  4. Benign essential HTN  - Comprehensive metabolic panel  5. Dyslipidemia  -  atorvastatin (LIPITOR) 40 MG tablet; Take 1 tablet (40 mg total) by mouth every evening.  Dispense: 90 tablet; Refill: 1 - Lipid panel  6. Type 2 diabetes mellitus with stage 3 chronic kidney disease, without  long-term current use of insulin (HCC)  - metFORMIN (GLUCOPHAGE) 850 MG tablet; Take 1 tablet (850 mg total) by mouth every evening.  Dispense: 30 tablet; Refill: 2  7. Migraine without aura and without status migrainosus, not intractable  Continue medication   8. Insomnia  - temazepam (RESTORIL) 15 MG capsule; Take 1 capsule (15 mg total) by mouth as needed.  Dispense: 30 capsule; Refill: 2  9. Neuritis or radiculitis due to rupture of lumbar intervertebral disc  - traMADol (ULTRAM) 50 MG tablet; Take 1 tablet (50 mg total) by mouth every 12 (twelve) hours as needed. For pain  Dispense: 60 tablet; Refill: 2  10. Bilateral shoulder pain  - traMADol (ULTRAM) 50 MG tablet; Take 1 tablet (50 mg total) by mouth every 12 (twelve) hours as needed. For pain  Dispense: 60 tablet; Refill: 2 - meloxicam (MOBIC) 15 MG tablet; Take 1 tablet (15 mg total) by mouth daily.  Dispense: 30 tablet; Refill: 2

## 2016-04-21 NOTE — Telephone Encounter (Signed)
Change rx to tablet #60 and it will only be $4.  Refill request was sent to Dr. Alba CoryKrichna Sowles for approval and submission.

## 2016-04-21 NOTE — Telephone Encounter (Signed)
Christian, the [facility]ist[facility] stating that the Rx for generic Zantac will be $40.00 even with their best discount card. They wanted to know if it should be filled or not.  Please advise.

## 2016-04-21 NOTE — Telephone Encounter (Signed)
Is there a cheaper H2 blocker, is it cheaper for her to get it OTC?

## 2016-04-21 NOTE — Patient Instructions (Signed)

## 2016-04-22 LAB — COMPREHENSIVE METABOLIC PANEL
ALT: 10 IU/L (ref 0–32)
AST: 16 IU/L (ref 0–40)
Albumin/Globulin Ratio: 1.2 (ref 1.2–2.2)
Albumin: 4.2 g/dL (ref 3.5–5.5)
Alkaline Phosphatase: 128 IU/L — ABNORMAL HIGH (ref 39–117)
BUN/Creatinine Ratio: 12 (ref 9–23)
BUN: 14 mg/dL (ref 6–24)
Bilirubin Total: 0.2 mg/dL (ref 0.0–1.2)
CO2: 24 mmol/L (ref 18–29)
Calcium: 9.2 mg/dL (ref 8.7–10.2)
Chloride: 103 mmol/L (ref 96–106)
Creatinine, Ser: 1.2 mg/dL — ABNORMAL HIGH (ref 0.57–1.00)
GFR calc Af Amer: 58 mL/min/{1.73_m2} — ABNORMAL LOW (ref >59)
GFR calc non Af Amer: 51 mL/min/{1.73_m2} — ABNORMAL LOW (ref >59)
Globulin, Total: 3.5 g/dL (ref 1.5–4.5)
Glucose: 95 mg/dL (ref 65–99)
Potassium: 4.2 mmol/L (ref 3.5–5.2)
Sodium: 143 mmol/L (ref 134–144)
Total Protein: 7.7 g/dL (ref 6.0–8.5)

## 2016-04-22 LAB — MAGNESIUM: Magnesium: 1.7 mg/dL (ref 1.6–2.3)

## 2016-04-22 LAB — LIPID PANEL
Chol/HDL Ratio: 2.9 ratio units (ref 0.0–4.4)
Cholesterol, Total: 160 mg/dL (ref 100–199)
HDL: 55 mg/dL (ref >39)
LDL Calculated: 88 mg/dL (ref 0–99)
Triglycerides: 86 mg/dL (ref 0–149)
VLDL Cholesterol Cal: 17 mg/dL (ref 5–40)

## 2016-05-11 ENCOUNTER — Encounter: Payer: Self-pay | Admitting: Family Medicine

## 2016-05-11 ENCOUNTER — Ambulatory Visit (INDEPENDENT_AMBULATORY_CARE_PROVIDER_SITE_OTHER): Payer: BLUE CROSS/BLUE SHIELD | Admitting: Family Medicine

## 2016-05-11 DIAGNOSIS — J40 Bronchitis, not specified as acute or chronic: Secondary | ICD-10-CM | POA: Diagnosis not present

## 2016-05-11 MED ORDER — HYDROCOD POLST-CPM POLST ER 10-8 MG/5ML PO SUER: 5.0000 mL | Freq: Two times a day (BID) | ORAL | Status: DC | PRN

## 2016-05-11 MED ORDER — PREDNISONE 10 MG PO TABS: 10.0000 mg | ORAL_TABLET | Freq: Every day | ORAL | Status: DC

## 2016-05-11 MED ORDER — AZITHROMYCIN 250 MG PO TABS: ORAL_TABLET | ORAL | Status: DC

## 2016-05-11 MED ORDER — FLUTICASONE FUROATE-VILANTEROL 100-25 MCG/INH IN AEPB: 1.0000 | INHALATION_SPRAY | Freq: Every day | RESPIRATORY_TRACT | Status: DC

## 2016-05-11 NOTE — Addendum Note (Signed)
Addended by: Alba CorySOWLES, Lea Walbert F on: 05/11/2016 03:24 PM   Modules accepted: Level of Service

## 2016-05-11 NOTE — Progress Notes (Addendum)
Name: Brandy CheeksLinda D Johnston   MRN: [id number]    DOB: Jul 14, 1961   Date:05/11/2016       Progress Note  Subjective  Chief Complaint  Chief Complaint  Patient presents with  . Cough    HPI   Bronchitis: symptoms started on Thrusday with chills, fatigue, followed by mild rhinorrhea, over the past few days symptoms are getting worse with a productive cough, some SOB and wheezing. Feeling tired and called in sick from work. Girlfriend was admitted to hospital with CAP.    Patient Active Problem List   Diagnosis Date Noted  . Well controlled type 2 diabetes mellitus with gastroparesis (HCC) 12/16/2015  . Diabetic neuropathy associated with type 2 diabetes mellitus (HCC) 09/18/2015  . Insomnia 09/18/2015  . Benign essential HTN 06/18/2015  . Chronic kidney disease (CKD), stage III (moderate) 06/18/2015  . Diabetes mellitus with renal manifestation (HCC) 06/18/2015  . Abnormal serum level of alkaline phosphatase 06/18/2015  . Neuritis or radiculitis due to rupture of lumbar intervertebral disc 06/18/2015  . Obesity (BMI 30.0-34.9) 06/18/2015  . Degenerative arthritis of hip 06/18/2015  . Sickle cell trait (HCC) 06/18/2015  . Dyslipidemia 05/27/2010  . Gastro-esophageal reflux disease without esophagitis 05/25/2008    Past Surgical History  Procedure Laterality Date  . Cholecystectomy    . Tubal ligation    . Fracture surgery Left     cast and pins   . Colonoscopy      Family History  Problem Relation Age of Onset  . Migraines Mother   . Diabetes Mother   . Cancer Mother     Breast  . Arthritis Brother   . Cancer Maternal Aunt     Breast  . Breast cancer Maternal Aunt   . Cancer Maternal Uncle     Lung and Colon  . Cirrhosis Brother     Social History   Social History  . Marital Status: Single    Spouse Name: N/A  . Number of Children: N/A  . Years of Education: N/A   Occupational History  . Not on file.   Social History Main Topics  . Smoking status: Never Smoker    . Smokeless tobacco: Never Used  . Alcohol Use: No  . Drug Use: No  . Sexual Activity: Yes   Other Topics Concern  . Not on file   Social History Narrative     Current outpatient prescriptions:  .  amLODipine-valsartan (EXFORGE) 5-160 MG tablet, Take 1 tablet by mouth daily., Disp: 30 tablet, Rfl: 5 .  aspirin 81 MG chewable tablet, Chew 1 tablet by mouth as needed., Disp: , Rfl:  .  atorvastatin (LIPITOR) 40 MG tablet, Take 1 tablet (40 mg total) by mouth every evening., Disp: 90 tablet, Rfl: 1 .  azithromycin (ZITHROMAX Z-PAK) 250 MG tablet, Take as directed, Disp: 6 each, Rfl: 0 .  Butalbital-APAP-Caffeine 50-300-40 MG CAPS, take 1 capsule by mouth every 6 hours if needed, Disp: 40 capsule, Rfl: 0 .  chlorpheniramine-HYDROcodone (TUSSIONEX PENNKINETIC ER) 10-8 MG/5ML SUER, Take 5 mLs by mouth every 12 (twelve) hours as needed., Disp: 140 mL, Rfl: 0 .  fluticasone furoate-vilanterol (BREO ELLIPTA) 100-25 MCG/INH AEPB, Inhale 1 puff into the lungs daily., Disp: 60 each, Rfl: 0 .  gabapentin (NEURONTIN) 300 MG capsule, Take 1 capsule (300 mg total) by mouth 2 (two) times daily., Disp: 180 capsule, Rfl: 1 .  GLUCOSE BLOOD VI, , Disp: , Rfl:  .  losartan-hydrochlorothiazide (HYZAAR) 100-25 MG tablet, Take 1 tablet  by mouth daily., Disp: 30 tablet, Rfl: 5 .  magnesium oxide (MAG-OX) 400 (241.3 Mg) MG tablet, , Disp: , Rfl: 0 .  magnesium oxide (MAG-OX) 400 MG tablet, Take 1 tablet by mouth 2 (two) times daily., Disp: , Rfl:  .  meloxicam (MOBIC) 15 MG tablet, Take 1 tablet (15 mg total) by mouth daily., Disp: 30 tablet, Rfl: 2 .  metFORMIN (GLUCOPHAGE) 850 MG tablet, Take 1 tablet (850 mg total) by mouth every evening., Disp: 30 tablet, Rfl: 2 .  metoCLOPramide (REGLAN) 5 MG/5ML solution, Take 10 mLs (10 mg total) by mouth 4 (four) times daily -  before meals and at bedtime., Disp: 120 mL, Rfl: 0 .  omeprazole (PRILOSEC) 20 MG capsule, , Disp: , Rfl: 0 .  predniSONE (DELTASONE) 10 MG  tablet, Take 1 tablet (10 mg total) by mouth daily with breakfast., Disp: 10 tablet, Rfl: 0 .  ranitidine (ZANTAC) 150 MG tablet, Take 1 tablet (150 mg total) by mouth 2 (two) times daily., Disp: 60 tablet, Rfl: 5 .  temazepam (RESTORIL) 15 MG capsule, Take 1 capsule (15 mg total) by mouth as needed., Disp: 30 capsule, Rfl: 2 .  traMADol (ULTRAM) 50 MG tablet, Take 1 tablet (50 mg total) by mouth every 12 (twelve) hours as needed. For pain, Disp: 60 tablet, Rfl: 2 .  Vitamin D, Ergocalciferol, (DRISDOL) 50000 UNITS CAPS capsule, Take 1 capsule (50,000 Units total) by mouth once a week., Disp: 30 capsule, Rfl: 0  No Known Allergies   ROS  Ten systems reviewed and is negative except as mentioned in HPI   Objective  Filed Vitals:   05/11/16 1449  BP: 122/78  Pulse: 104  Temp: 98.6 F (37 C)  TempSrc: Oral  Resp: 18  Weight: 178 lb 1.6 oz (80.786 kg)  SpO2: 98%    Body mass index is 31.56 kg/(m^2).  Physical Exam  Constitutional: Patient appears well-developed and well-nourished. Obese  No distress.  HEENT: head atraumatic, normocephalic, pupils equal and reactive to light, ears normal bilaterally, ck supple, throat within normal limits Cardiovascular: Normal rate, regular rhythm and normal heart sounds.  No murmur heard. No BLE edema. Pulmonary/Chest: Effort normal , bilateral rhonchi and some end in and expiratory wheezing Abdominal: Soft.  There is no tenderness. Psychiatric: Patient has a normal mood and affect. behavior is normal. Judgment and thought content normal.  Recent Results (from the past 2160 hour(s))  POCT HgB A1C     Status: None   Collection Time: 04/21/16  9:04 AM  Result Value Ref Range   Hemoglobin A1C 6.4   POCT UA - Microalbumin     Status: None   Collection Time: 04/21/16  9:09 AM  Result Value Ref Range   Microalbumin Ur, POC 20 mg/L   Creatinine, POC  mg/dL   Albumin/Creatinine Ratio, Urine, POC    Lipid panel     Status: None   Collection Time:  04/21/16 10:12 AM  Result Value Ref Range   Cholesterol, Total 160 100 - 199 mg/dL   Triglycerides 86 0 - 149 mg/dL   HDL 55 >40 mg/dL   VLDL Cholesterol Cal 17 5 - 40 mg/dL   LDL Calculated 88 0 - 99 mg/dL   Chol/HDL Ratio 2.9 0.0 - 4.4 ratio units    Comment:  T. Chol/HDL Ratio                                             Men  Women                               1/2 Avg.Risk  3.4    3.3                                   Avg.Risk  5.0    4.4                                2X Avg.Risk  9.6    7.1                                3X Avg.Risk 23.4   11.0   Comprehensive metabolic panel     Status: Abnormal   Collection Time: 04/21/16 10:12 AM  Result Value Ref Range   Glucose 95 65 - 99 mg/dL   BUN 14 6 - 24 mg/dL   Creatinine, Ser 0.45 (H) 0.57 - 1.00 mg/dL   GFR calc non Af Amer 51 (L) >59 mL/min/1.73   GFR calc Af Amer 58 (L) >59 mL/min/1.73   BUN/Creatinine Ratio 12 9 - 23   Sodium 143 134 - 144 mmol/L   Potassium 4.2 3.5 - 5.2 mmol/L   Chloride 103 96 - 106 mmol/L   CO2 24 18 - 29 mmol/L   Calcium 9.2 8.7 - 10.2 mg/dL   Total Protein 7.7 6.0 - 8.5 g/dL   Albumin 4.2 3.5 - 5.5 g/dL   Globulin, Total 3.5 1.5 - 4.5 g/dL   Albumin/Globulin Ratio 1.2 1.2 - 2.2   Bilirubin Total 0.2 0.0 - 1.2 mg/dL   Alkaline Phosphatase 128 (H) 39 - 117 IU/L   AST 16 0 - 40 IU/L   ALT 10 0 - 32 IU/L  Magnesium     Status: None   Collection Time: 04/21/16 10:12 AM  Result Value Ref Range   Magnesium 1.7 1.6 - 2.3 mg/dL      WUJ8/1: Depression screen [facility] 2/9 05/11/2016 04/21/2016 01/14/2016 12/03/2015 06/18/2015  Decreased Interest 0 0 0 0 0  Down, Depressed, Hopeless 0 0 0 0 0  PHQ - 2 Score 0 0 0 0 0    Fall Risk: Fall Risk  05/11/2016 04/21/2016 01/14/2016 12/03/2015 06/18/2015  Falls in the past year? No No No No No    Functional Status Survey: Is the patient deaf or have difficulty hearing?: No Does the patient have difficulty seeing, even when wearing  glasses/contacts?: No Does the patient have difficulty concentrating, remembering, or making decisions?: No Does the patient have difficulty walking or climbing stairs?: No Does the patient have difficulty dressing or bathing?: No Does the patient have difficulty doing errands alone such as visiting a doctor's office or shopping?: No    Assessment & Plan  1. Bronchitis  - fluticasone furoate-vilanterol (BREO ELLIPTA) 100-25 MCG/INH AEPB; Inhale 1 puff into the lungs daily.  Dispense: 60 each; Refill: 0 - azithromycin (ZITHROMAX Z-PAK) 250 MG tablet; Take as  directed  Dispense: 6 each; Refill: 0 - predniSONE (DELTASONE) 10 MG tablet; Take 1 tablet (10 mg total) by mouth daily with breakfast.  Dispense: 10 tablet; Refill: 0 - chlorpheniramine-HYDROcodone (TUSSIONEX PENNKINETIC ER) 10-8 MG/5ML SUER; Take 5 mLs by mouth every 12 (twelve) hours as needed.  Dispense: 140 mL; Refill: 0  Try inhaler first and only start prednisone by mouth if no improvement to avoid increase in glucose levels. Also advised to rinse mouth after using Breo to avoid thrush She has not been checking glucose and is not sure how it is running at home. Appetite is normal. Advised to avoid sweets

## 2016-05-21 ENCOUNTER — Other Ambulatory Visit: Payer: Self-pay | Admitting: Family Medicine

## 2016-05-22 NOTE — Telephone Encounter (Signed)
Patient requesting refill. 

## 2016-06-15 ENCOUNTER — Other Ambulatory Visit: Payer: Self-pay | Admitting: Family Medicine

## 2016-06-15 NOTE — Telephone Encounter (Signed)
Patient requesting refill. 

## 2016-07-07 ENCOUNTER — Other Ambulatory Visit: Payer: Self-pay | Admitting: Family Medicine

## 2016-07-07 NOTE — Telephone Encounter (Signed)
Patient requesting refill. 

## 2016-07-23 ENCOUNTER — Encounter: Payer: Self-pay | Admitting: Family Medicine

## 2016-07-23 ENCOUNTER — Ambulatory Visit (INDEPENDENT_AMBULATORY_CARE_PROVIDER_SITE_OTHER): Payer: BLUE CROSS/BLUE SHIELD | Admitting: Family Medicine

## 2016-07-23 VITALS — BP 126/74 | HR 89 | Temp 98.2°F | Resp 18 | Ht 63.0 in | Wt 180.8 lb

## 2016-07-23 DIAGNOSIS — E1143 Type 2 diabetes mellitus with diabetic autonomic (poly)neuropathy: Secondary | ICD-10-CM | POA: Diagnosis not present

## 2016-07-23 DIAGNOSIS — K2970 Gastritis, unspecified, without bleeding: Secondary | ICD-10-CM

## 2016-07-23 DIAGNOSIS — E785 Hyperlipidemia, unspecified: Secondary | ICD-10-CM

## 2016-07-23 DIAGNOSIS — M25511 Pain in right shoulder: Secondary | ICD-10-CM

## 2016-07-23 DIAGNOSIS — G4700 Insomnia, unspecified: Secondary | ICD-10-CM | POA: Diagnosis not present

## 2016-07-23 DIAGNOSIS — R112 Nausea with vomiting, unspecified: Secondary | ICD-10-CM

## 2016-07-23 DIAGNOSIS — I10 Essential (primary) hypertension: Secondary | ICD-10-CM | POA: Diagnosis not present

## 2016-07-23 DIAGNOSIS — M5116 Intervertebral disc disorders with radiculopathy, lumbar region: Secondary | ICD-10-CM | POA: Diagnosis not present

## 2016-07-23 DIAGNOSIS — E1142 Type 2 diabetes mellitus with diabetic polyneuropathy: Secondary | ICD-10-CM

## 2016-07-23 DIAGNOSIS — M25512 Pain in left shoulder: Secondary | ICD-10-CM

## 2016-07-23 DIAGNOSIS — R790 Abnormal level of blood mineral: Secondary | ICD-10-CM

## 2016-07-23 LAB — POCT GLYCOSYLATED HEMOGLOBIN (HGB A1C): Hemoglobin A1C: 6.3

## 2016-07-23 MED ORDER — AMLODIPINE BESYLATE-VALSARTAN 5-160 MG PO TABS: 1.0000 | ORAL_TABLET | Freq: Every day | ORAL | 5 refills | Status: DC

## 2016-07-23 MED ORDER — BUTALBITAL-APAP-CAFFEINE 50-300-40 MG PO CAPS: ORAL_CAPSULE | ORAL | 0 refills | Status: DC

## 2016-07-23 MED ORDER — TEMAZEPAM 15 MG PO CAPS: 15.0000 mg | ORAL_CAPSULE | ORAL | 2 refills | Status: DC | PRN

## 2016-07-23 MED ORDER — TRAMADOL HCL 50 MG PO TABS: 50.0000 mg | ORAL_TABLET | Freq: Two times a day (BID) | ORAL | 2 refills | Status: DC | PRN

## 2016-07-23 MED ORDER — OMEPRAZOLE 20 MG PO CPDR: 20.0000 mg | DELAYED_RELEASE_CAPSULE | Freq: Two times a day (BID) | ORAL | 2 refills | Status: DC

## 2016-07-23 MED ORDER — MELOXICAM 7.5 MG PO TABS: 7.5000 mg | ORAL_TABLET | Freq: Every day | ORAL | 2 refills | Status: DC

## 2016-07-23 MED ORDER — GABAPENTIN 300 MG PO CAPS: 300.0000 mg | ORAL_CAPSULE | Freq: Two times a day (BID) | ORAL | 1 refills | Status: DC

## 2016-07-23 NOTE — Progress Notes (Signed)
Name: Brandy Johnston   MRN: [id number]    DOB: Oct 03, 1961   Date:07/23/2016       Progress Note  Subjective  Chief Complaint  Chief Complaint  Patient presents with  . Medication Refill    3 month F/U, wants to know if there is a different form for magnesium due to the texture is hard to swallow  . Diabetes    Patient checks every 2 weeks and states her highest has been around 180's to 190's, lowest has been 79, patient does complain of being shaky sometimes  . Hypertension    headaches occasionally  . Hyperlipidemia  . Migraine    Patient had one two weeks ago and medication helps symptom control, Patient states stress and lights make her migraines worst  . Shoulder Pain    Bilateral pain, patient states when ever she lifts up her shoulders and tries to put them back down, Meloxicam helps some and describes the pain as painful and sharp  . Gastroesophageal Reflux    Patient states 2 hours after eating her stomach pain is worst, gargling and bubbling feeling. Patient has been experiencing abdominal pain and has been taking medication daily.     HPI  DMII with renal manifestation, gastroparesis and neuropathy. States now finger tips and toes are always tingling and is bothersome but not painful. Glucose at home has been around 70's-110's, highest was 240 ( usually when not following a diabetic diet ). Denies  polyphagia, polydipsia or  polyuria. She is compliant with Metformin, but would like to stop because of the size of the pill since hgbA1C is at goal. Also on Reglan but she has not been compliant. Gabapentin for neuropathy and symptoms are better controlled, and also on ARB for kidney protection. She is tolerating Gabapentin well now, she has been unable to tolerate TID when working, usually taking twice daily   Insomnia: She is taking Temazepam prn, wakes up at 3 am but able to fall back asleep, she is afraid of going up on the dose.  She usually wakes up feeling rested in am.    HTN: taking medication bp is at goal , no chest pain , seldom has a flutter sensation in her chest, but not recently  - no pain or diaphoresis or SOB associated with flutters - symptoms lasts a few seconds and resolves by itself.   Hyperlipidemia: taking Lipitor occasionally and denies side effects. Discussed importance of compliance. Due for labs now  GERD: she is back on Omeprazole once daily because while on Ranitidine symptoms got worse, however she went on vacation the first week of July and developed worsening on symptoms since. Nausea and vomiting happens about 2 hours after meals. No epigastric pain or change in appetite.   Left lumbar radiculitis:no recent flares,The left outer thigh and bottom of left foot stays numb. She had NCS and MRI. She does not want to see Dr. Council Mechanic at this time or get PT because of cost.   Low calcium and magnesium: seen by Endo still taking supplementation, we were supposed to stop Omeprazole and start Ranitidine, but unable to tolerate the switch.  Bilateral shoulder pain: symptoms started about 1 year  ago and is getting progressively worse. 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 Pain is described as a soreness on both shoulder, worse on anterior right shoulder. Also with movement of shoulder, such as abducting or internal rotation. Pain is sharp with movement. No  rashes, no swelling or redness. She is on Meloxicam 15 mg but symptoms have not improved.   Migraine: she has episodes seldom, usually temporal, sharp, associated with photophobia, but no phonophobia. No nausea or vomiting. She takes prn Fioricet   Patient Active Problem List   Diagnosis Date Noted  . Well controlled type 2 diabetes mellitus with gastroparesis (HCC) 12/16/2015  . Diabetic neuropathy associated with type 2 diabetes mellitus (HCC) 09/18/2015  . Insomnia 09/18/2015  . Benign essential HTN 06/18/2015  . Chronic kidney disease  (CKD), stage III (moderate) 06/18/2015  . Diabetes mellitus with renal manifestation (HCC) 06/18/2015  . Abnormal serum level of alkaline phosphatase 06/18/2015  . Neuritis or radiculitis due to rupture of lumbar intervertebral disc 06/18/2015  . Obesity (BMI 30.0-34.9) 06/18/2015  . Degenerative arthritis of hip 06/18/2015  . Sickle cell trait (HCC) 06/18/2015  . Dyslipidemia 05/27/2010  . Gastro-esophageal reflux disease without esophagitis 05/25/2008    Past Surgical History:  Procedure Laterality Date  . CHOLECYSTECTOMY    . COLONOSCOPY    . FRACTURE SURGERY Left    cast and pins   . TUBAL LIGATION      Family History  Problem Relation Age of Onset  . Migraines Mother   . Diabetes Mother   . Cancer Mother     Breast  . Arthritis Brother   . Cancer Maternal Aunt     Breast  . Breast cancer Maternal Aunt   . Cancer Maternal Uncle     Lung and Colon  . Cirrhosis Brother     Social History   Social History  . Marital status: Single    Spouse name: N/A  . Number of children: N/A  . Years of education: N/A   Occupational History  . Not on file.   Social History Main Topics  . Smoking status: Never Smoker  . Smokeless tobacco: Never Used  . Alcohol use No  . Drug use: No  . Sexual activity: Yes   Other Topics Concern  . Not on file   Social History Narrative  . No narrative on file     Current Outpatient Prescriptions:  .  amLODipine-valsartan (EXFORGE) 5-160 MG tablet, Take 1 tablet by mouth daily., Disp: 30 tablet, Rfl: 5 .  aspirin 81 MG chewable tablet, Chew 1 tablet by mouth as needed., Disp: , Rfl:  .  atorvastatin (LIPITOR) 40 MG tablet, Take 1 tablet (40 mg total) by mouth every evening., Disp: 90 tablet, Rfl: 1 .  Butalbital-APAP-Caffeine 50-300-40 MG CAPS, take 1 capsule by mouth every 6 hours if needed, Disp: 40 capsule, Rfl: 0 .  cyclobenzaprine (FLEXERIL) 5 MG tablet, take 1 tablet by mouth every 8 hours if needed, Disp: 90 tablet, Rfl: 2 .   gabapentin (NEURONTIN) 300 MG capsule, Take 1 capsule (300 mg total) by mouth 2 (two) times daily., Disp: 180 capsule, Rfl: 1 .  GLUCOSE BLOOD VI, , Disp: , Rfl:  .  magnesium oxide (MAG-OX) 400 MG tablet, Take 1 tablet by mouth 2 (two) times daily., Disp: , Rfl:  .  meloxicam (MOBIC) 15 MG tablet, take 1 tablet by mouth once daily, Disp: 30 tablet, Rfl: 2 .  metoCLOPramide (REGLAN) 5 MG/5ML solution, Take 10 mLs (10 mg total) by mouth 4 (four) times daily -  before meals and at bedtime., Disp: 120 mL, Rfl: 0 .  omeprazole (PRILOSEC) 20 MG capsule, take 1 capsule by mouth every morning, Disp: 30 capsule, Rfl: 5 .  temazepam (RESTORIL) 15  MG capsule, Take 1 capsule (15 mg total) by mouth as needed., Disp: 30 capsule, Rfl: 2 .  traMADol (ULTRAM) 50 MG tablet, Take 1 tablet (50 mg total) by mouth every 12 (twelve) hours as needed. For pain, Disp: 60 tablet, Rfl: 2  No Known Allergies   ROS  Constitutional: Negative for fever or weight change.  Respiratory: Negative for cough and shortness of breath.   Cardiovascular: Negative for chest pain or palpitations.  Gastrointestinal: Negative for abdominal pain, no bowel changes.  Musculoskeletal: Negative for gait problem or joint swelling.  Skin: Negative for rash.  Neurological: Negative for dizziness or headache.  No other specific complaints in a complete review of systems (except as listed in HPI above).  Objective  Vitals:   07/23/16 0927  BP: 126/74  Pulse: 89  Resp: 18  Temp: 98.2 F (36.8 C)  TempSrc: Oral  SpO2: 97%  Weight: 180 lb 12.8 oz (82 kg)  Height: 5\' 3"  (1.6 m)    Body mass index is 32.03 kg/m.  Physical Exam  Constitutional: Patient appears well-developed and well-nourished. Obese  No distress.  HEENT: head atraumatic, normocephalic, pupils equal and reactive to light,  neck supple, throat within normal limits Cardiovascular: Normal rate, regular rhythm and normal heart sounds.  No murmur heard. No BLE  edema. Pulmonary/Chest: Effort normal and breath sounds normal. No respiratory distress. Abdominal: Soft.  There is no tenderness. Psychiatric: Patient has a normal mood and affect. behavior is normal. Judgment and thought content normal. Muscular Skeletal: pain during palpation of right lateral shoulder, positive impingement sign on the right side  Recent Results (from the past 2160 hour(s))  POCT HgB A1C     Status: None   Collection Time: 07/23/16  9:36 AM  Result Value Ref Range   Hemoglobin A1C 6.3      PHQ2/9: Depression screen [facility] 2/9 07/23/2016 05/11/2016 04/21/2016 01/14/2016 12/03/2015  Decreased Interest 0 0 0 0 0  Down, Depressed, Hopeless 0 0 0 0 0  PHQ - 2 Score 0 0 0 0 0    Fall Risk: Fall Risk  07/23/2016 05/11/2016 04/21/2016 01/14/2016 12/03/2015  Falls in the past year? No No No No No     Functional Status Survey: Is the patient deaf or have difficulty hearing?: No Does the patient have difficulty seeing, even when wearing glasses/contacts?: No Does the patient have difficulty concentrating, remembering, or making decisions?: No Does the patient have difficulty walking or climbing stairs?: No Does the patient have difficulty dressing or bathing?: No Does the patient have difficulty doing errands alone such as visiting a doctor's office or shopping?: No   Assessment & Plan  1. Well controlled type 2 diabetes mellitus with gastroparesis (HCC)  She has difficulty taking Metformin because of size of the pill and would like to try just following diet. Symptoms of indigestion, nausea and now vomiting are getting worse. Previous Upper GI showed possible gastritis and gastroparesis. Discussed referring her to GI but she would like to hold off. She will try smaller meals and snacks, follow a GERD diet and we will check h. Pylori before we make the referral  - POCT HgB A1C  2. Diabetic polyneuropathy associated with type 2 diabetes mellitus (HCC)  - gabapentin (NEURONTIN)  300 MG capsule; Take 1 capsule (300 mg total) by mouth 2 (two) times daily.  Dispense: 180 capsule; Refill: 1 - COMPLETE METABOLIC PANEL WITH GFR  3. Benign essential HTN  - amLODipine-valsartan (EXFORGE) 5-160 MG tablet; Take  1 tablet by mouth daily.  Dispense: 30 tablet; Refill: 5 - COMPLETE METABOLIC PANEL WITH GFR  4. Dyslipidemia  Continue medication  5. Bilateral shoulder pain  Symptoms are getting worse, with some impingement signs on right side, refer to Ortho. We are decreasing dose of Meloxicam to 7.5 mg because of GERD/gastroparesis.  - traMADol (ULTRAM) 50 MG tablet; Take 1 tablet (50 mg total) by mouth every 12 (twelve) hours as needed. For pain  Dispense: 60 tablet; Refill: 2 - AMB referral to orthopedics  6. Insomnia  - temazepam (RESTORIL) 15 MG capsule; Take 1 capsule (15 mg total) by mouth as needed.  Dispense: 30 capsule; Refill: 2  7. Gastritis  - H. pylori breath test  8. Neuritis or radiculitis due to rupture of lumbar intervertebral disc  - traMADol (ULTRAM) 50 MG tablet; Take 1 tablet (50 mg total) by mouth every 12 (twelve) hours as needed. For pain  Dispense: 60 tablet; Refill: 2 - gabapentin (NEURONTIN) 300 MG capsule; Take 1 capsule (300 mg total) by mouth 2 (two) times daily.  Dispense: 180 capsule; Refill: 1  9. Nausea and vomiting, vomiting of unspecified type  - H. pylori breath test - COMPLETE METABOLIC PANEL WITH GFR

## 2016-07-23 NOTE — Addendum Note (Signed)
Addended by: Cynda Familia on: 07/23/2016 10:22 AM   Modules accepted: Orders

## 2016-07-24 LAB — H. PYLORI BREATH TEST: H. pylori Breath Test: NOT DETECTED

## 2016-07-24 LAB — COMPLETE METABOLIC PANEL WITH GFR
ALT: 20 U/L (ref 6–29)
AST: 22 U/L (ref 10–35)
Albumin: 4 g/dL (ref 3.6–5.1)
Alkaline Phosphatase: 120 U/L (ref 33–130)
BUN: 18 mg/dL (ref 7–25)
CO2: 27 mmol/L (ref 20–31)
Calcium: 9.2 mg/dL (ref 8.6–10.4)
Chloride: 106 mmol/L (ref 98–110)
Creat: 1.26 mg/dL — ABNORMAL HIGH (ref 0.50–1.05)
GFR, Est African American: 55 mL/min — ABNORMAL LOW (ref >=60)
GFR, Est Non African American: 48 mL/min — ABNORMAL LOW (ref >=60)
Glucose, Bld: 76 mg/dL (ref 65–99)
Potassium: 4.4 mmol/L (ref 3.5–5.3)
Sodium: 141 mmol/L (ref 135–146)
Total Bilirubin: 0.2 mg/dL (ref 0.2–1.2)
Total Protein: 7.3 g/dL (ref 6.1–8.1)

## 2016-07-24 LAB — MAGNESIUM: Magnesium: 1.9 mg/dL (ref 1.5–2.5)

## 2016-07-27 DIAGNOSIS — M7541 Impingement syndrome of right shoulder: Secondary | ICD-10-CM | POA: Diagnosis not present

## 2016-07-27 DIAGNOSIS — M7542 Impingement syndrome of left shoulder: Secondary | ICD-10-CM | POA: Diagnosis not present

## 2016-07-29 ENCOUNTER — Other Ambulatory Visit: Payer: Self-pay | Admitting: Family Medicine

## 2016-07-29 DIAGNOSIS — E1142 Type 2 diabetes mellitus with diabetic polyneuropathy: Secondary | ICD-10-CM

## 2016-07-29 DIAGNOSIS — E1122 Type 2 diabetes mellitus with diabetic chronic kidney disease: Secondary | ICD-10-CM

## 2016-07-29 DIAGNOSIS — N183 Chronic kidney disease, stage 3 unspecified: Secondary | ICD-10-CM

## 2016-08-17 ENCOUNTER — Emergency Department
Admission: EM | Admit: 2016-08-17 | Discharge: 2016-08-17 | Disposition: A | Discharge status: Home / Self Care | Payer: BLUE CROSS/BLUE SHIELD | Attending: Emergency Medicine | Admitting: Emergency Medicine

## 2016-08-17 ENCOUNTER — Emergency Department: Payer: BLUE CROSS/BLUE SHIELD

## 2016-08-17 ENCOUNTER — Encounter: Payer: Self-pay | Admitting: Emergency Medicine

## 2016-08-17 DIAGNOSIS — R112 Nausea with vomiting, unspecified: Secondary | ICD-10-CM | POA: Diagnosis not present

## 2016-08-17 DIAGNOSIS — Z79899 Other long term (current) drug therapy: Secondary | ICD-10-CM | POA: Insufficient documentation

## 2016-08-17 DIAGNOSIS — I129 Hypertensive chronic kidney disease with stage 1 through stage 4 chronic kidney disease, or unspecified chronic kidney disease: Secondary | ICD-10-CM | POA: Insufficient documentation

## 2016-08-17 DIAGNOSIS — N183 Chronic kidney disease, stage 3 (moderate): Secondary | ICD-10-CM | POA: Diagnosis not present

## 2016-08-17 DIAGNOSIS — R197 Diarrhea, unspecified: Secondary | ICD-10-CM | POA: Insufficient documentation

## 2016-08-17 DIAGNOSIS — E1122 Type 2 diabetes mellitus with diabetic chronic kidney disease: Secondary | ICD-10-CM | POA: Diagnosis not present

## 2016-08-17 DIAGNOSIS — Z7984 Long term (current) use of oral hypoglycemic drugs: Secondary | ICD-10-CM | POA: Diagnosis not present

## 2016-08-17 DIAGNOSIS — Z7982 Long term (current) use of aspirin: Secondary | ICD-10-CM | POA: Insufficient documentation

## 2016-08-17 DIAGNOSIS — R109 Unspecified abdominal pain: Secondary | ICD-10-CM | POA: Insufficient documentation

## 2016-08-17 DIAGNOSIS — R1031 Right lower quadrant pain: Secondary | ICD-10-CM | POA: Diagnosis not present

## 2016-08-17 LAB — COMPREHENSIVE METABOLIC PANEL
ALT: 44 U/L (ref 14–54)
AST: 57 U/L — ABNORMAL HIGH (ref 15–41)
Albumin: 4.3 g/dL (ref 3.5–5.0)
Alkaline Phosphatase: 146 U/L — ABNORMAL HIGH (ref 38–126)
Anion gap: 5 (ref 5–15)
BUN: 16 mg/dL (ref 6–20)
CO2: 30 mmol/L (ref 22–32)
Calcium: 9.3 mg/dL (ref 8.9–10.3)
Chloride: 105 mmol/L (ref 101–111)
Creatinine, Ser: 1.25 mg/dL — ABNORMAL HIGH (ref 0.44–1.00)
GFR calc Af Amer: 55 mL/min — ABNORMAL LOW (ref >60)
GFR calc non Af Amer: 47 mL/min — ABNORMAL LOW (ref >60)
Glucose, Bld: 112 mg/dL — ABNORMAL HIGH (ref 65–99)
Potassium: 4.6 mmol/L (ref 3.5–5.1)
Sodium: 140 mmol/L (ref 135–145)
Total Bilirubin: 0.3 mg/dL (ref 0.3–1.2)
Total Protein: 8.6 g/dL — ABNORMAL HIGH (ref 6.5–8.1)

## 2016-08-17 LAB — URINALYSIS COMPLETE WITH MICROSCOPIC (ARMC ONLY)
Bilirubin Urine: NEGATIVE
Glucose, UA: NEGATIVE mg/dL
Ketones, ur: NEGATIVE mg/dL
Leukocytes, UA: NEGATIVE
Nitrite: NEGATIVE
Protein, ur: NEGATIVE mg/dL
Specific Gravity, Urine: 1.009 (ref 1.005–1.030)
pH: 6 (ref 5.0–8.0)

## 2016-08-17 LAB — CBC
HCT: 39.1 % (ref 35.0–47.0)
Hemoglobin: 13 g/dL (ref 12.0–16.0)
MCH: 26.9 pg (ref 26.0–34.0)
MCHC: 33.2 g/dL (ref 32.0–36.0)
MCV: 80.9 fL (ref 80.0–100.0)
Platelets: 192 10*3/uL (ref 150–440)
RBC: 4.84 MIL/uL (ref 3.80–5.20)
RDW: 15.4 % — ABNORMAL HIGH (ref 11.5–14.5)
WBC: 11.9 10*3/uL — ABNORMAL HIGH (ref 3.6–11.0)

## 2016-08-17 LAB — LIPASE, BLOOD: Lipase: 19 U/L (ref 11–51)

## 2016-08-17 LAB — LACTIC ACID, PLASMA: Lactic Acid, Venous: 1.9 mmol/L (ref 0.5–1.9)

## 2016-08-17 MED ORDER — SODIUM CHLORIDE 0.9 % IV BOLUS (SEPSIS)
1000.0000 mL | Freq: Once | INTRAVENOUS | Status: AC
  Administered 2016-08-17: 1000 mL via INTRAVENOUS

## 2016-08-17 MED ORDER — IOPAMIDOL (ISOVUE-300) INJECTION 61%
100.0000 mL | Freq: Once | INTRAVENOUS | Status: AC | PRN
  Administered 2016-08-17: 100 mL via INTRAVENOUS
  Filled 2016-08-17: qty 100

## 2016-08-17 MED ORDER — ONDANSETRON HCL 4 MG PO TABS: 4.0000 mg | ORAL_TABLET | Freq: Three times a day (TID) | ORAL | 0 refills | Status: DC | PRN

## 2016-08-17 MED ORDER — DIATRIZOATE MEGLUMINE & SODIUM 66-10 % PO SOLN
15.0000 mL | Freq: Once | ORAL | Status: AC
  Administered 2016-08-17: 15 mL via ORAL

## 2016-08-17 MED ORDER — ONDANSETRON HCL 4 MG/2ML IJ SOLN
4.0000 mg | Freq: Once | INTRAMUSCULAR | Status: AC
  Administered 2016-08-17: 4 mg via INTRAVENOUS
  Filled 2016-08-17: qty 2

## 2016-08-17 NOTE — ED Notes (Signed)
CT notified that pt finished drinking contrast. 

## 2016-08-17 NOTE — ED Provider Notes (Signed)
[facility] Emergency Department Provider Note    ____________________________________________   I have reviewed the triage vital signs and the nursing notes.   HISTORY  Chief Complaint Abdominal Pain; Nausea; and Diarrhea   History limited by: Not Limited   HPI Brandy Johnston is a 55 y.o. female who presents to the emergency department today because of concerns for abdominal pain, nausea and vomiting. Patient states that she felt a little off this morning and only was able to eat part of her breakfast. She then became more nauseous. She did vomit multiple times without noted seen any blood in it. She  developed abdominal pain throughout the day. It is located primarily in the right lower side. She  had 3 episodes of diarrhea. These are also nonbloody.   Past Medical History:  Diagnosis Date  . Chronic renal impairment, stage 3 (moderate)   . Diabetes mellitus without complication (HCC)   . Hyperlipidemia   . Hypertension   . Hypokalemia   . Insomnia   . Low calcium levels   . Osteoarthrosis, hip    right hip  . Paresthesia   . Sickle cell anemia (HCC)   . Sickle-cell trait (HCC)   . Tension headache     Patient Active Problem List   Diagnosis Date Noted  . Well controlled type 2 diabetes mellitus with gastroparesis (HCC) 12/16/2015  . Diabetic neuropathy associated with type 2 diabetes mellitus (HCC) 09/18/2015  . Insomnia 09/18/2015  . Benign essential HTN 06/18/2015  . Chronic kidney disease (CKD), stage III (moderate) 06/18/2015  . Diabetes mellitus with renal manifestation (HCC) 06/18/2015  . Abnormal serum level of alkaline phosphatase 06/18/2015  . Neuritis or radiculitis due to rupture of lumbar intervertebral disc 06/18/2015  . Obesity (BMI 30.0-34.9) 06/18/2015  . Degenerative arthritis of hip 06/18/2015  . Sickle cell trait (HCC) 06/18/2015  . Dyslipidemia 05/27/2010  . Gastro-esophageal reflux disease without esophagitis  05/25/2008    Past Surgical History:  Procedure Laterality Date  . CHOLECYSTECTOMY    . COLONOSCOPY    . FRACTURE SURGERY Left    cast and pins   . TUBAL LIGATION      Prior to Admission medications   Medication Sig Start Date End Date Taking? Authorizing Provider  amLODipine-valsartan (EXFORGE) 5-160 MG tablet Take 1 tablet by mouth daily. 07/23/16   Alba CoryKrichna Sowles, MD  aspirin 81 MG chewable tablet Chew 1 tablet by mouth as needed. 05/27/10   Historical Provider, MD  atorvastatin (LIPITOR) 40 MG tablet Take 1 tablet (40 mg total) by mouth every evening. 04/21/16   Alba CoryKrichna Sowles, MD  Butalbital-APAP-Caffeine 50-300-40 MG CAPS take 1 capsule by mouth every 6 hours if needed 07/23/16   Alba CoryKrichna Sowles, MD  cyclobenzaprine (FLEXERIL) 5 MG tablet take 1 tablet by mouth every 8 hours if needed 06/15/16   Alba CoryKrichna Sowles, MD  gabapentin (NEURONTIN) 300 MG capsule Take 1 capsule (300 mg total) by mouth 2 (two) times daily. 07/23/16   Alba CoryKrichna Sowles, MD  GLUCOSE BLOOD VI  08/01/12   Historical Provider, MD  magnesium oxide (MAG-OX) 400 MG tablet Take 1 tablet by mouth 2 (two) times daily. 11/05/14   Historical Provider, MD  meloxicam (MOBIC) 7.5 MG tablet Take 1 tablet (7.5 mg total) by mouth daily. 07/23/16   Alba CoryKrichna Sowles, MD  metFORMIN (GLUCOPHAGE) 850 MG tablet take 1 tablet by mouth every evening 07/29/16   Alba CoryKrichna Sowles, MD  metoCLOPramide (REGLAN) 5 MG/5ML solution Take 10 mLs (10 mg total)  by mouth 4 (four) times daily -  before meals and at bedtime. 12/16/15   Alba CoryKrichna Sowles, MD  omeprazole (PRILOSEC) 20 MG capsule Take 1 capsule (20 mg total) by mouth 2 (two) times daily before a meal. 07/23/16   Alba CoryKrichna Sowles, MD  temazepam (RESTORIL) 15 MG capsule Take 1 capsule (15 mg total) by mouth as needed. 07/23/16   Alba CoryKrichna Sowles, MD  traMADol (ULTRAM) 50 MG tablet Take 1 tablet (50 mg total) by mouth every 12 (twelve) hours as needed. For pain 07/23/16   Alba CoryKrichna Sowles, MD    Allergies Review of  patient's allergies indicates no known allergies.  Family History  Problem Relation Age of Onset  . Migraines Mother   . Diabetes Mother   . Cancer Mother     Breast  . Arthritis Brother   . Cancer Maternal Aunt     Breast  . Breast cancer Maternal Aunt   . Cancer Maternal Uncle     Lung and Colon  . Cirrhosis Brother     Social History Social History  Substance Use Topics  . Smoking status: Never Smoker  . Smokeless tobacco: Never Used  . Alcohol use No    Review of Systems  Constitutional: Negative for fever. Cardiovascular: Negative for chest pain. Respiratory: Negative for shortness of breath. Gastrointestinal: Positive for abdominal pain. Positive for nausea and vomiting.  Genitourinary: Negative for dysuria. Neurological: Negative for headaches, focal weakness or numbness.   10-point ROS otherwise negative.  ____________________________________________   PHYSICAL EXAM:  VITAL SIGNS: ED Triage Vitals  Enc Vitals Group     BP 08/17/16 1723 (!) 156/83     Pulse Rate 08/17/16 1723 (!) 112     Resp 08/17/16 1723 18     Temp 08/17/16 1723 (!) 100.9 F (38.3 C)     Temp Source 08/17/16 1723 Oral     SpO2 08/17/16 1723 98 %     Weight 08/17/16 1724 175 lb (79.4 kg)     Height 08/17/16 1724 5\' 3"  (1.6 m)     Head Circumference --      Peak Flow --      Pain Score 08/17/16 1733 10   Constitutional: Alert and oriented. Well appearing and in no distress. Eyes: Conjunctivae are normal. PERRL. Normal extraocular movements. ENT   Head: Normocephalic and atraumatic.   Nose: No congestion/rhinnorhea.   Mouth/Throat: Mucous membranes are moist.   Neck: No stridor. Hematological/Lymphatic/Immunilogical: No cervical lymphadenopathy. Cardiovascular: Normal rate, regular rhythm.  No murmurs, rubs, or gallops. Respiratory: Normal respiratory effort without tachypnea nor retractions. Breath sounds are clear and equal bilaterally. No  wheezes/rales/rhonchi. Gastrointestinal: Soft and nontender. No distention.  Genitourinary: Deferred Musculoskeletal: Normal range of motion in all extremities. No joint effusions.  No lower extremity tenderness nor edema. Neurologic:  Normal speech and language. No gross focal neurologic deficits are appreciated.  Skin:  Skin is warm, dry and intact. No rash noted. Psychiatric: Mood and affect are normal. Speech and behavior are normal. Patient exhibits appropriate insight and judgment.  ____________________________________________    LABS (pertinent positives/negatives)  Labs Reviewed  COMPREHENSIVE METABOLIC PANEL - Abnormal; Notable for the following:       Result Value   Glucose, Bld 112 (*)    Creatinine, Ser 1.25 (*)    Total Protein 8.6 (*)    AST 57 (*)    Alkaline Phosphatase 146 (*)    GFR calc non Af Amer 47 (*)    GFR calc Af [place] DagoAmer  55 (*)    All other components within normal limits  CBC - Abnormal; Notable for the following:    WBC 11.9 (*)    RDW 15.4 (*)    All other components within normal limits  URINALYSIS COMPLETEWITH MICROSCOPIC ([facility] ONLY) - Abnormal; Notable for the following:    Color, Urine STRAW (*)    APPearance CLEAR (*)    Hgb urine dipstick 1+ (*)    Bacteria, UA RARE (*)    Squamous Epithelial / LPF 0-5 (*)    All other components within normal limits  LIPASE, BLOOD  LACTIC ACID, PLASMA  LACTIC ACID, PLASMA     ____________________________________________   EKG  None  ____________________________________________    RADIOLOGY  CT abd/pel IMPRESSION:  1. No evidence for acute abnormality.  2. Normal appendix.  3. No bowel obstruction or abscess.  4. Coronary artery disease.  5. Aortic atherosclerosis.      ____________________________________________   PROCEDURES  Procedures  ____________________________________________   INITIAL IMPRESSION / ASSESSMENT AND PLAN / ED COURSE  Pertinent labs & imaging results  that were available during my care of the patient were reviewed by me and considered in my medical decision making (see chart for details).  Patient presented to the emergency department today because of concerns for nausea vomiting abdominal pain. Patient is febrile here in the emergency department. On exam she does have tenderness to the right lower quadrant. Given this finding will plan on obtaining blood work and CT scan to evaluate for appendicitis.  Clinical Course   CT scan did not show any appendicitis or other concerning findings. Think at this point than likely patient suffering from gastroenteritis. Did discuss this with the patient. Will discharge with antiemetics. ____________________________________________   FINAL CLINICAL IMPRESSION(S) / ED DIAGNOSES  Final diagnoses:  Abdominal pain, unspecified abdominal location  Nausea and vomiting, vomiting of unspecified type     Note: This dictation was prepared with Dragon dictation. Any transcriptional errors that result from this process are unintentional    Phineas Semen, MD 08/17/16 2218

## 2016-08-17 NOTE — Discharge Instructions (Signed)
Please seek medical attention for any high fevers, chest pain, shortness of breath, change in behavior, persistent vomiting, bloody stool or any other new or concerning symptoms.  

## 2016-08-17 NOTE — ED Notes (Signed)
Pt reports that she has nausea/vomiting (8 times in 24 hours)/diarrhea (2 loose stools in 24 hours) - started spotting 3-4 days ago but has not had a period in 8 years

## 2016-08-17 NOTE — ED Triage Notes (Signed)
Pt presents with abd pain, nausea and diarrhea.

## 2016-08-18 ENCOUNTER — Encounter: Payer: Self-pay | Admitting: Family Medicine

## 2016-08-18 DIAGNOSIS — I700 Atherosclerosis of aorta: Secondary | ICD-10-CM | POA: Insufficient documentation

## 2016-08-19 ENCOUNTER — Ambulatory Visit (INDEPENDENT_AMBULATORY_CARE_PROVIDER_SITE_OTHER): Payer: BLUE CROSS/BLUE SHIELD | Admitting: Family Medicine

## 2016-08-19 ENCOUNTER — Other Ambulatory Visit: Payer: Self-pay | Admitting: Family Medicine

## 2016-08-19 ENCOUNTER — Encounter: Payer: Self-pay | Admitting: Family Medicine

## 2016-08-19 VITALS — BP 118/70 | HR 102 | Temp 99.1°F | Resp 18 | Ht 63.0 in | Wt 172.3 lb

## 2016-08-19 DIAGNOSIS — R634 Abnormal weight loss: Secondary | ICD-10-CM

## 2016-08-19 DIAGNOSIS — K2970 Gastritis, unspecified, without bleeding: Secondary | ICD-10-CM

## 2016-08-19 DIAGNOSIS — Z124 Encounter for screening for malignant neoplasm of cervix: Secondary | ICD-10-CM | POA: Diagnosis not present

## 2016-08-19 DIAGNOSIS — Z1239 Encounter for other screening for malignant neoplasm of breast: Secondary | ICD-10-CM

## 2016-08-19 DIAGNOSIS — Z0000 Encounter for general adult medical examination without abnormal findings: Secondary | ICD-10-CM | POA: Diagnosis not present

## 2016-08-19 DIAGNOSIS — D72829 Elevated white blood cell count, unspecified: Secondary | ICD-10-CM | POA: Diagnosis not present

## 2016-08-19 DIAGNOSIS — N950 Postmenopausal bleeding: Secondary | ICD-10-CM

## 2016-08-19 DIAGNOSIS — Z01419 Encounter for gynecological examination (general) (routine) without abnormal findings: Secondary | ICD-10-CM

## 2016-08-19 DIAGNOSIS — R197 Diarrhea, unspecified: Secondary | ICD-10-CM

## 2016-08-19 NOTE — Progress Notes (Signed)
Name: Brandy Johnston   MRN: [id number]    DOB: [birth year]   Date:08/19/2016       Progress Note  Subjective  Chief Complaint  Chief Complaint  Patient presents with  . Annual Exam    HPI  Well woman : she is homosexual, no new toys. She has noticed some spotting when she wipes for the past week. Mild cramping but also having diarrhea for the past two days. She has some RLQ pain went to [facility] and CT unremarkable, except for atherosclerosis of aorta. She has lost 11 lbs since last visit. She has gastroparesis, has noticed lack of appetite and inability to eat ( feels full all the time) and more recently diarrhea and recent visit to [facility]. At [facility] WBC elevated, also protein and mild drop on kidney function. She still feels tired, but abdominal pain is not as severe, but still has not appetite, and had two episodes of soft and one watery stools this morning. No blood in stools , she denies fever but has chills.   Patient Active Problem List   Diagnosis Date Noted  . Atherosclerosis of abdominal aorta ([place]) 08/18/2016  . Well controlled type 2 diabetes mellitus with gastroparesis ([place]) 12/16/2015  . Diabetic neuropathy associated with type 2 diabetes mellitus ([place]) 09/18/2015  . Insomnia 09/18/2015  . Benign essential HTN 06/18/2015  . Chronic kidney disease (CKD), stage III (moderate) 06/18/2015  . Diabetes mellitus with renal manifestation ([place]) 06/18/2015  . Abnormal serum level of alkaline phosphatase 06/18/2015  . Neuritis or radiculitis due to rupture of lumbar intervertebral disc 06/18/2015  . Obesity (BMI 30.0-34.9) 06/18/2015  . Degenerative arthritis of hip 06/18/2015  . Sickle cell trait ([place]) 06/18/2015  . Dyslipidemia 05/27/2010  . Gastro-esophageal reflux disease without esophagitis 05/25/2008    Past Surgical History:  Procedure Laterality Date  . CHOLECYSTECTOMY    . COLONOSCOPY    . FRACTURE SURGERY Left    cast and pins   . TUBAL LIGATION      Family History  Problem  Relation Age of Onset  . Migraines Mother   . Diabetes Mother   . Cancer Mother     Breast  . Arthritis Brother   . Cancer Maternal Aunt     Breast  . Breast cancer Maternal Aunt   . Cancer Maternal Uncle     Lung and Colon  . Cirrhosis Brother     Social History   Social History  . Marital status: Single    Spouse name: N/A  . Number of children: N/A  . Years of education: N/A   Occupational History  . Not on file.   Social History Main Topics  . Smoking status: Never Smoker  . Smokeless tobacco: Never Used  . Alcohol use No  . Drug use: No  . Sexual activity: Yes   Other Topics Concern  . Not on file   Social History Narrative  . No narrative on file     Current Outpatient Prescriptions:  .  amLODipine-valsartan (EXFORGE) 5-160 MG tablet, Take 1 tablet by mouth daily., Disp: 30 tablet, Rfl: 5 .  aspirin 81 MG chewable tablet, Chew 1 tablet by mouth as needed., Disp: , Rfl:  .  atorvastatin (LIPITOR) 40 MG tablet, Take 1 tablet (40 mg total) by mouth every evening., Disp: 90 tablet, Rfl: 1 .  Butalbital-APAP-Caffeine 50-300-40 MG CAPS, take 1 capsule by mouth every 6 hours if needed, Disp: 40 capsule, Rfl: 0 .  cyclobenzaprine (FLEXERIL) 5  MG tablet, take 1 tablet by mouth every 8 hours if needed, Disp: 90 tablet, Rfl: 2 .  gabapentin (NEURONTIN) 300 MG capsule, Take 1 capsule (300 mg total) by mouth 2 (two) times daily., Disp: 180 capsule, Rfl: 1 .  GLUCOSE BLOOD VI, , Disp: , Rfl:  .  magnesium oxide (MAG-OX) 400 MG tablet, Take 1 tablet by mouth 2 (two) times daily., Disp: , Rfl:  .  meloxicam (MOBIC) 7.5 MG tablet, Take 1 tablet (7.5 mg total) by mouth daily., Disp: 30 tablet, Rfl: 2 .  metFORMIN (GLUCOPHAGE) 850 MG tablet, take 1 tablet by mouth every evening, Disp: 30 tablet, Rfl: 2 .  metoCLOPramide (REGLAN) 5 MG/5ML solution, Take 10 mLs (10 mg total) by mouth 4 (four) times daily -  before meals and at bedtime., Disp: 120 mL, Rfl: 0 .  omeprazole  (PRILOSEC) 20 MG capsule, Take 1 capsule (20 mg total) by mouth 2 (two) times daily before a meal., Disp: 60 capsule, Rfl: 2 .  ondansetron (ZOFRAN) 4 MG tablet, Take 1 tablet (4 mg total) by mouth every 8 (eight) hours as needed., Disp: 20 tablet, Rfl: 0 .  temazepam (RESTORIL) 15 MG capsule, Take 1 capsule (15 mg total) by mouth as needed., Disp: 30 capsule, Rfl: 2 .  traMADol (ULTRAM) 50 MG tablet, Take 1 tablet (50 mg total) by mouth every 12 (twelve) hours as needed. For pain, Disp: 60 tablet, Rfl: 2  No Known Allergies   ROS  Constitutional: Negative for fever, positive for  weight change.  Respiratory: Negative for cough and shortness of breath.   Cardiovascular: Negative for chest pain or palpitations.  Gastrointestinal: Positive for abdominal pain,also  bowel changes.  Musculoskeletal: Negative for gait problem or joint swelling.  Skin: Negative for rash.  Neurological: Negative for dizziness or headache.  No other specific complaints in a complete review of systems (except as listed in HPI above).  Objective  Vitals:   08/19/16 1126  BP: 118/70  Pulse: (!) 102  Resp: 18  Temp: 99.1 F (37.3 C)  SpO2: 96%  Weight: 172 lb 5 oz (78.2 kg)  Height: 5' 3"  (1.6 m)    Body mass index is 30.52 kg/m.  Physical Exam  Constitutional: Patient appears well-developed and well-nourished. No distress.  HENT: Head: Normocephalic and atraumatic. Ears: B TMs ok, no erythema or effusion; Nose: Nose normal. Mouth/Throat: Oropharynx is clear and moist. No oropharyngeal exudate.  Eyes: Conjunctivae and EOM are normal. Pupils are equal, round, and reactive to light. No scleral icterus.  Neck: Normal range of motion. Neck supple. No JVD present. No thyromegaly present.  Cardiovascular: Normal rate, regular rhythm and normal heart sounds.  No murmur heard. No BLE edema. Pulmonary/Chest: Effort normal and breath sounds normal. No respiratory distress. Abdominal: Soft. Bowel sounds are  normal, no distension. There is no tenderness. no masses Breast: no lumps or masses, no nipple discharge or rashes FEMALE GENITALIA:  External genitalia normal External urethra normal Vaginal vault normal without discharge or lesions Cervix normal without discharge or lesions Bimanual exam normal without masses RECTAL: not done Musculoskeletal: Normal range of motion, no joint effusions. No gross deformities Neurological: he is alert and oriented to person, place, and time. No cranial nerve deficit. Coordination, balance, strength, speech and gait are normal.  Skin: Skin is warm and dry. No rash noted. No erythema.  Psychiatric: Patient has a normal mood and affect. behavior is normal. Judgment and thought content normal.  Recent Results (from the past  2160 hour(s))  POCT HgB A1C     Status: None   Collection Time: 07/23/16  9:36 AM  Result Value Ref Range   Hemoglobin A1C 6.3   Magnesium     Status: None   Collection Time: 07/23/16 10:22 AM  Result Value Ref Range   Magnesium 1.9 1.5 - 2.5 mg/dL  H. pylori breath test     Status: None   Collection Time: 07/23/16 10:45 AM  Result Value Ref Range   H. pylori Breath Test NOT DETECTED Not Detected    Comment:   Antimicrobials, proton pump inhibitors, and bismuth preparations are known to suppress H. pylori, and ingestion of these prior to H. pylori diagnostic testing may lead to false negative results. If clinically indicated, the test may be repeated on a new specimen obtained two weeks after discontinuing treatment.     COMPLETE METABOLIC PANEL WITH GFR     Status: Abnormal   Collection Time: 07/23/16 10:45 AM  Result Value Ref Range   Sodium 141 135 - 146 mmol/L   Potassium 4.4 3.5 - 5.3 mmol/L   Chloride 106 98 - 110 mmol/L   CO2 27 20 - 31 mmol/L   Glucose, Bld 76 65 - 99 mg/dL   BUN 18 7 - 25 mg/dL   Creat 1.26 (H) 0.50 - 1.05 mg/dL    Comment:   For patients > or = 57 years of age: The upper reference limit  for Creatinine is approximately 13% higher for people identified as African-American.      Total Bilirubin 0.2 0.2 - 1.2 mg/dL   Alkaline Phosphatase 120 33 - 130 U/L   AST 22 10 - 35 U/L   ALT 20 6 - 29 U/L   Total Protein 7.3 6.1 - 8.1 g/dL   Albumin 4.0 3.6 - 5.1 g/dL   Calcium 9.2 8.6 - 10.4 mg/dL   GFR, Est African American 55 (L) >=60 mL/min   GFR, Est Non African American 48 (L) >=60 mL/min  Lipase, blood     Status: None   Collection Time: 08/17/16  5:24 PM  Result Value Ref Range   Lipase 19 11 - 51 U/L  Comprehensive metabolic panel     Status: Abnormal   Collection Time: 08/17/16  5:24 PM  Result Value Ref Range   Sodium 140 135 - 145 mmol/L   Potassium 4.6 3.5 - 5.1 mmol/L   Chloride 105 101 - 111 mmol/L   CO2 30 22 - 32 mmol/L   Glucose, Bld 112 (H) 65 - 99 mg/dL   BUN 16 6 - 20 mg/dL   Creatinine, Ser 1.25 (H) 0.44 - 1.00 mg/dL   Calcium 9.3 8.9 - 10.3 mg/dL   Total Protein 8.6 (H) 6.5 - 8.1 g/dL   Albumin 4.3 3.5 - 5.0 g/dL   AST 57 (H) 15 - 41 U/L   ALT 44 14 - 54 U/L   Alkaline Phosphatase 146 (H) 38 - 126 U/L   Total Bilirubin 0.3 0.3 - 1.2 mg/dL   GFR calc non Af Amer 47 (L) >60 mL/min   GFR calc Af Amer 55 (L) >60 mL/min    Comment: (NOTE) The eGFR has been calculated using the CKD EPI equation. This calculation has not been validated in all clinical situations. eGFR's persistently <60 mL/min signify possible Chronic Kidney Disease.    Anion gap 5 5 - 15  CBC     Status: Abnormal   Collection Time: 08/17/16  5:24 PM  Result Value Ref Range   WBC 11.9 (H) 3.6 - 11.0 K/uL   RBC 4.84 3.80 - 5.20 MIL/uL   Hemoglobin 13.0 12.0 - 16.0 g/dL   HCT 39.1 35.0 - 47.0 %   MCV 80.9 80.0 - 100.0 fL   MCH 26.9 26.0 - 34.0 pg   MCHC 33.2 32.0 - 36.0 g/dL   RDW 15.4 (H) 11.5 - 14.5 %   Platelets 192 150 - 440 K/uL  Urinalysis complete, with microscopic     Status: Abnormal   Collection Time: 08/17/16  5:24 PM  Result Value Ref Range   Color, Urine STRAW  (A) YELLOW   APPearance CLEAR (A) CLEAR   Glucose, UA NEGATIVE NEGATIVE mg/dL   Bilirubin Urine NEGATIVE NEGATIVE   Ketones, ur NEGATIVE NEGATIVE mg/dL   Specific Gravity, Urine 1.009 1.005 - 1.030   Hgb urine dipstick 1+ (A) NEGATIVE   pH 6.0 5.0 - 8.0   Protein, ur NEGATIVE NEGATIVE mg/dL   Nitrite NEGATIVE NEGATIVE   Leukocytes, UA NEGATIVE NEGATIVE   RBC / HPF 0-5 0 - 5 RBC/hpf   WBC, UA 0-5 0 - 5 WBC/hpf   Bacteria, UA RARE (A) NONE SEEN   Squamous Epithelial / LPF 0-5 (A) NONE SEEN  Lactic acid, plasma     Status: None   Collection Time: 08/17/16  5:53 PM  Result Value Ref Range   Lactic Acid, Venous 1.9 0.5 - 1.9 mmol/L      PHQ2/9: Depression screen [facility] 2/9 08/19/2016 07/23/2016 05/11/2016 04/21/2016 01/14/2016  Decreased Interest 0 0 0 0 0  Down, Depressed, Hopeless 0 0 0 0 0  PHQ - 2 Score 0 0 0 0 0     Fall Risk: Fall Risk  08/19/2016 07/23/2016 05/11/2016 04/21/2016 01/14/2016  Falls in the past year? No No No No No     Functional Status Survey: Is the patient deaf or have difficulty hearing?: No Does the patient have difficulty seeing, even when wearing glasses/contacts?: No Does the patient have difficulty concentrating, remembering, or making decisions?: No Does the patient have difficulty walking or climbing stairs?: No Does the patient have difficulty dressing or bathing?: No Does the patient have difficulty doing errands alone such as visiting a doctor's office or shopping?: No    Assessment & Plan  1. Well woman exam  Discussed importance of 150 minutes of physical activity weekly, eat two servings of fish weekly, eat one serving of tree nuts ( cashews, pistachios, pecans, almonds.Marland Kitchen) every other day, eat 6 servings of fruit/vegetables daily and drink plenty of water and avoid sweet beverages.   2. Post-menopausal bleeding  - Ambulatory referral to Gynecology  3. Weight loss  - CBC with Differential/Platelet - COMPLETE METABOLIC PANEL WITH GFR  4.  Diarrhea, unspecified type  - COMPLETE METABOLIC PANEL WITH GFR  5. Leukocytosis  - CBC with Differential/Platelet  6. Gastritis  - Ambulatory referral to Gastroenterology  7. Breast cancer screening  - MM Digital Screening; Future  8. Cervical cancer screening  - PapLb, HPV, rfx16/18

## 2016-08-21 LAB — PAPLB, HPV, RFX16/18
HPV, high-risk: NEGATIVE
PAP Smear Comment: 0

## 2016-08-28 DIAGNOSIS — R197 Diarrhea, unspecified: Secondary | ICD-10-CM | POA: Diagnosis not present

## 2016-08-28 DIAGNOSIS — R634 Abnormal weight loss: Secondary | ICD-10-CM | POA: Diagnosis not present

## 2016-08-28 DIAGNOSIS — D72829 Elevated white blood cell count, unspecified: Secondary | ICD-10-CM | POA: Diagnosis not present

## 2016-08-28 LAB — CBC WITH DIFFERENTIAL/PLATELET
Basophils Absolute: 0 cells/uL (ref 0–200)
Basophils Relative: 0 %
Eosinophils Absolute: 0 cells/uL — ABNORMAL LOW (ref 15–500)
Eosinophils Relative: 0 %
HCT: 35.6 % (ref 35.0–45.0)
Hemoglobin: 11.6 g/dL — ABNORMAL LOW (ref 11.7–15.5)
Lymphocytes Relative: 17 %
Lymphs Abs: 2244 cells/uL (ref 850–3900)
MCH: 26.1 pg — ABNORMAL LOW (ref 27.0–33.0)
MCHC: 32.6 g/dL (ref 32.0–36.0)
MCV: 80.2 fL (ref 80.0–100.0)
MPV: 10.8 fL (ref 7.5–12.5)
Monocytes Absolute: 528 cells/uL (ref 200–950)
Monocytes Relative: 4 %
Neutro Abs: 10428 cells/uL (ref 1500–7800)
Neutrophils Relative %: 79 %
Platelets: 306 10*3/uL (ref 140–400)
RBC: 4.44 MIL/uL (ref 3.80–5.10)
RDW: 14.9 % (ref 11.0–15.0)
WBC: 13.2 10*3/uL — ABNORMAL HIGH (ref 3.8–10.8)

## 2016-08-29 LAB — COMPLETE METABOLIC PANEL WITH GFR
ALT: 23 U/L (ref 6–29)
AST: 19 U/L (ref 10–35)
Albumin: 4 g/dL (ref 3.6–5.1)
Alkaline Phosphatase: 125 U/L (ref 33–130)
BUN: 19 mg/dL (ref 7–25)
CO2: 24 mmol/L (ref 20–31)
Calcium: 8.9 mg/dL (ref 8.6–10.4)
Chloride: 106 mmol/L (ref 98–110)
Creat: 1.39 mg/dL — ABNORMAL HIGH (ref 0.50–1.05)
GFR, Est African American: 49 mL/min — ABNORMAL LOW (ref >=60)
GFR, Est Non African American: 42 mL/min — ABNORMAL LOW (ref >=60)
Glucose, Bld: 121 mg/dL — ABNORMAL HIGH (ref 65–99)
Potassium: 4.3 mmol/L (ref 3.5–5.3)
Sodium: 140 mmol/L (ref 135–146)
Total Bilirubin: 0.2 mg/dL (ref 0.2–1.2)
Total Protein: 7.3 g/dL (ref 6.1–8.1)

## 2016-08-31 ENCOUNTER — Other Ambulatory Visit: Payer: Self-pay | Admitting: Family Medicine

## 2016-08-31 DIAGNOSIS — D72829 Elevated white blood cell count, unspecified: Secondary | ICD-10-CM

## 2016-08-31 DIAGNOSIS — D649 Anemia, unspecified: Secondary | ICD-10-CM

## 2016-09-03 ENCOUNTER — Other Ambulatory Visit: Payer: Self-pay

## 2016-09-03 DIAGNOSIS — D72829 Elevated white blood cell count, unspecified: Secondary | ICD-10-CM | POA: Diagnosis not present

## 2016-09-03 DIAGNOSIS — D649 Anemia, unspecified: Secondary | ICD-10-CM

## 2016-09-03 DIAGNOSIS — D489 Neoplasm of uncertain behavior, unspecified: Secondary | ICD-10-CM | POA: Diagnosis not present

## 2016-09-03 NOTE — Progress Notes (Unsigned)
cbc

## 2016-09-04 LAB — CBC WITH DIFFERENTIAL
Basophils Absolute: 0.1 10*3/uL (ref 0.0–0.2)
Basos: 0 %
EOS (ABSOLUTE): 0.1 10*3/uL (ref 0.0–0.4)
Eos: 1 %
Hematocrit: 36.3 % (ref 34.0–46.6)
Hemoglobin: 11.9 g/dL (ref 11.1–15.9)
Immature Grans (Abs): 0.1 10*3/uL (ref 0.0–0.1)
Immature Granulocytes: 1 %
Lymphocytes Absolute: 4 10*3/uL — ABNORMAL HIGH (ref 0.7–3.1)
Lymphs: 32 %
MCH: 26.6 pg (ref 26.6–33.0)
MCHC: 32.8 g/dL (ref 31.5–35.7)
MCV: 81 fL (ref 79–97)
Monocytes Absolute: 0.7 10*3/uL (ref 0.1–0.9)
Monocytes: 6 %
Neutrophils Absolute: 7.4 10*3/uL — ABNORMAL HIGH (ref 1.4–7.0)
Neutrophils: 60 %
RBC: 4.47 x10E6/uL (ref 3.77–5.28)
RDW: 14.9 % (ref 12.3–15.4)
WBC: 12.4 10*3/uL — ABNORMAL HIGH (ref 3.4–10.8)

## 2016-09-06 ENCOUNTER — Other Ambulatory Visit: Payer: Self-pay | Admitting: Family Medicine

## 2016-09-06 DIAGNOSIS — D72829 Elevated white blood cell count, unspecified: Secondary | ICD-10-CM

## 2016-09-16 ENCOUNTER — Other Ambulatory Visit: Payer: Self-pay | Admitting: Family Medicine

## 2016-09-16 ENCOUNTER — Telehealth: Payer: Self-pay | Admitting: Family Medicine

## 2016-09-16 ENCOUNTER — Ambulatory Visit
Admission: RE | Admit: 2016-09-16 | Discharge: 2016-09-16 | Disposition: A | Discharge status: Home / Self Care | Payer: BLUE CROSS/BLUE SHIELD | Source: Ambulatory Visit | Attending: Family Medicine | Admitting: Family Medicine

## 2016-09-16 DIAGNOSIS — Z1231 Encounter for screening mammogram for malignant neoplasm of breast: Secondary | ICD-10-CM | POA: Diagnosis not present

## 2016-09-16 DIAGNOSIS — Z1239 Encounter for other screening for malignant neoplasm of breast: Secondary | ICD-10-CM

## 2016-09-16 MED ORDER — VITAMIN D (ERGOCALCIFEROL) 1.25 MG (50000 UNIT) PO CAPS: 50000.0000 [IU] | ORAL_CAPSULE | ORAL | 0 refills | Status: DC

## 2016-09-21 NOTE — Telephone Encounter (Signed)
Look like prescription has been sent to pharmacy, please close chart. Thank you

## 2016-09-22 ENCOUNTER — Telehealth: Payer: Self-pay | Admitting: Family Medicine

## 2016-09-22 ENCOUNTER — Other Ambulatory Visit: Payer: Self-pay

## 2016-09-22 ENCOUNTER — Encounter: Payer: Self-pay | Admitting: Gastroenterology

## 2016-09-22 ENCOUNTER — Ambulatory Visit (INDEPENDENT_AMBULATORY_CARE_PROVIDER_SITE_OTHER): Payer: BLUE CROSS/BLUE SHIELD | Admitting: Gastroenterology

## 2016-09-22 VITALS — BP 119/73 | HR 84 | Temp 98.7°F | Ht 63.0 in | Wt 182.2 lb

## 2016-09-22 DIAGNOSIS — R6881 Early satiety: Secondary | ICD-10-CM

## 2016-09-22 NOTE — Telephone Encounter (Signed)
Yes. She needs to keep appointment. Post-menopausal bleeding has to be evaluated even if it stops to rule out endometrial cancer

## 2016-09-22 NOTE — Progress Notes (Signed)
Gastroenterology Consultation  Referring Provider:     Alba CorySowles, Krichna, MD Primary Care Physician:  Ruel FavorsKrichna F Sowles, MD Primary Gastroenterologist:  Dr. Servando SnareWohl     Reason for Consultation:     Gastritis        HPI:   Brandy Johnston is a 55 y.o. y/o female referred for consultation & management of Gastritis by Dr. Ruel FavorsKrichna F Sowles, MD.  This patient comes today after reporting that she had abdominal pain last month and had a CT scan that did not show a cause for her abdominal pain. The patient states that she has intermittent diarrhea alternating with constipation. The patient also reports that she had a colonoscopy probably 5 years ago and reports that it was normal. The patient has not had any nausea vomiting but she does report feeling full fast. The patient did have a upper GI back in December of last year that showed her to have thickened folds in the stomach with overall delayed gastric emptying consistent with possible gastroparesis. The patient does have a history of diabetes. There is no report of any vomiting blood. The patient does report that she is lost approximately 16 pounds over the last year but has gained 8 of them back recently. The patient reports that she is feeling much better since she was given some Zofran in the emergency room  Past Medical History:  Diagnosis Date  . Chronic renal impairment, stage 3 (moderate)   . Diabetes mellitus without complication (HCC)   . Hyperlipidemia   . Hypertension   . Hypokalemia   . Insomnia   . Low calcium levels   . Osteoarthrosis, hip    right hip  . Paresthesia   . Sickle cell anemia (HCC)   . Sickle-cell trait (HCC)   . Tension headache     Past Surgical History:  Procedure Laterality Date  . CHOLECYSTECTOMY    . COLONOSCOPY    . FRACTURE SURGERY Left    cast and pins   . TUBAL LIGATION      Prior to Admission medications   Medication Sig Start Date End Date Taking? Authorizing Provider  amLODipine-valsartan  (EXFORGE) 5-160 MG tablet Take 1 tablet by mouth daily. 07/23/16  Yes Alba CoryKrichna Sowles, MD  aspirin 81 MG chewable tablet Chew 1 tablet by mouth as needed. 05/27/10  Yes Historical Provider, MD  atorvastatin (LIPITOR) 40 MG tablet Take 1 tablet (40 mg total) by mouth every evening. 04/21/16  Yes Alba CoryKrichna Sowles, MD  Butalbital-APAP-Caffeine 50-300-40 MG CAPS take 1 capsule by mouth every 6 hours if needed 07/23/16  Yes Alba CoryKrichna Sowles, MD  cyclobenzaprine (FLEXERIL) 5 MG tablet take 1 tablet by mouth every 8 hours if needed 06/15/16  Yes Alba CoryKrichna Sowles, MD  gabapentin (NEURONTIN) 300 MG capsule Take 1 capsule (300 mg total) by mouth 2 (two) times daily. 07/23/16  Yes Alba CoryKrichna Sowles, MD  GLUCOSE BLOOD VI  08/01/12  Yes Historical Provider, MD  magnesium oxide (MAG-OX) 400 MG tablet Take 1 tablet by mouth 2 (two) times daily. 11/05/14  Yes Historical Provider, MD  meloxicam (MOBIC) 7.5 MG tablet Take 1 tablet (7.5 mg total) by mouth daily. 07/23/16  Yes Alba CoryKrichna Sowles, MD  omeprazole (PRILOSEC) 20 MG capsule Take 1 capsule (20 mg total) by mouth 2 (two) times daily before a meal. 07/23/16  Yes Alba CoryKrichna Sowles, MD  ondansetron (ZOFRAN) 4 MG tablet Take 1 tablet (4 mg total) by mouth every 8 (eight) hours as needed. 08/17/16  Yes Dustin FlockGraydon  Derrill Kay, MD  temazepam (RESTORIL) 15 MG capsule Take 1 capsule (15 mg total) by mouth as needed. 07/23/16  Yes Alba Cory, MD  traMADol (ULTRAM) 50 MG tablet Take 1 tablet (50 mg total) by mouth every 12 (twelve) hours as needed. For pain 07/23/16  Yes Alba Cory, MD  Vitamin D, Ergocalciferol, (DRISDOL) 50000 units CAPS capsule Take 1 capsule (50,000 Units total) by mouth every 7 (seven) days. 09/16/16  Yes Alba Cory, MD  metFORMIN (GLUCOPHAGE) 850 MG tablet take 1 tablet by mouth every evening Patient not taking: Reported on 09/22/2016 07/29/16   Alba Cory, MD  metoCLOPramide (REGLAN) 5 MG/5ML solution Take 10 mLs (10 mg total) by mouth 4 (four) times daily -  before meals  and at bedtime. Patient not taking: Reported on 09/22/2016 12/16/15   Alba Cory, MD    Family History  Problem Relation Age of Onset  . Migraines Mother   . Diabetes Mother   . Cancer Mother     lung  . Arthritis Brother   . Breast cancer Maternal Aunt   . Cancer Maternal Uncle     Lung and Colon  . Cirrhosis Brother   . Breast cancer Cousin      Social History  Substance Use Topics  . Smoking status: Never Smoker  . Smokeless tobacco: Never Used  . Alcohol use No    Allergies as of 09/22/2016  . (No Known Allergies)    Review of Systems:    All systems reviewed and negative except where noted in HPI.   Physical Exam:  BP 119/73   Pulse 84   Temp 98.7 F (37.1 C) (Oral)   Ht 5\' 3"  (1.6 m)   Wt 182 lb 3.2 oz (82.6 kg)   BMI 32.28 kg/m  No LMP recorded. Patient is not currently having periods (Reason: Other). Psych:  Alert and cooperative. Normal mood and affect. General:   Alert,  Well-developed, well-nourished, pleasant and cooperative in NAD Head:  Normocephalic and atraumatic. Eyes:  Sclera clear, no icterus.   Conjunctiva pink. Ears:  Normal auditory acuity. Nose:  No deformity, discharge, or lesions. Mouth:  No deformity or lesions,oropharynx pink & moist. Neck:  Supple; no masses or thyromegaly. Lungs:  Respirations even and unlabored.  Clear throughout to auscultation.   No wheezes, crackles, or rhonchi. No acute distress. Heart:  Regular rate and rhythm; no murmurs, clicks, rubs, or gallops. Abdomen:  Normal bowel sounds.  No bruits.  Soft, non-tender and non-distended without masses, hepatosplenomegaly or hernias noted.  No guarding or rebound tenderness.  Negative Carnett sign.   Rectal:  Deferred.  Msk:  Symmetrical without gross deformities.  Good, equal movement & strength bilaterally. Pulses:  Normal pulses noted. Extremities:  No clubbing or edema.  No cyanosis. Neurologic:  Alert and oriented x3;  grossly normal neurologically. Skin:   Intact without significant lesions or rashes.  No jaundice. Lymph Nodes:  No significant cervical adenopathy. Psych:  Alert and cooperative. Normal mood and affect.  Imaging Studies: Mm Digital Screening  Result Date: 09/16/2016 CLINICAL DATA:  Screening. EXAM: DIGITAL SCREENING BILATERAL MAMMOGRAM WITH CAD COMPARISON:  Previous exam(s). ACR Breast Density Category b: There are scattered areas of fibroglandular density. FINDINGS: There are no findings suspicious for malignancy. Images were processed with CAD. IMPRESSION: No mammographic evidence of malignancy. A result letter of this screening mammogram will be mailed directly to the patient. RECOMMENDATION: Screening mammogram in one year. (Code:SM-B-01Y) BI-RADS CATEGORY  1: Negative. Electronically Signed  By: Ted Mcalpine M.D.   On: 09/16/2016 12:56    Assessment and Plan:   Brandy Johnston is a 55 y.o. y/o female who has a history of early satiety. The patient has alternating diarrhea and constipation but states that it has been much better recently. There is no report of any rectal bleeding. The patient will have an upper endoscopy set up because of her early satiety. Otherwise the patient states she feels better now that she has a long time. The patient has been explained the plan and agrees with it.   Note: This dictation was prepared with Dragon dictation along with smaller phrase technology. Any transcriptional errors that result from this process are unintentional.

## 2016-09-22 NOTE — Telephone Encounter (Signed)
Patient would like to know if she need to keep her appointment for GYN for this Thursday with [facility]. States that you told her that her pap was normal. She is not spotting it stopped a couple days after she saw you.

## 2016-09-23 NOTE — Telephone Encounter (Signed)
Patient called checking status on message above, I informed her to keep the appointment.

## 2016-09-24 ENCOUNTER — Other Ambulatory Visit: Payer: Self-pay

## 2016-09-24 ENCOUNTER — Encounter: Payer: BLUE CROSS/BLUE SHIELD | Admitting: Obstetrics and Gynecology

## 2016-09-29 ENCOUNTER — Encounter: Payer: Self-pay | Admitting: *Deleted

## 2016-09-30 ENCOUNTER — Other Ambulatory Visit: Payer: Self-pay | Admitting: Obstetrics and Gynecology

## 2016-09-30 ENCOUNTER — Encounter: Payer: Self-pay | Admitting: Obstetrics and Gynecology

## 2016-09-30 ENCOUNTER — Ambulatory Visit (INDEPENDENT_AMBULATORY_CARE_PROVIDER_SITE_OTHER): Payer: BLUE CROSS/BLUE SHIELD | Admitting: Obstetrics and Gynecology

## 2016-09-30 ENCOUNTER — Other Ambulatory Visit (INDEPENDENT_AMBULATORY_CARE_PROVIDER_SITE_OTHER): Payer: BLUE CROSS/BLUE SHIELD

## 2016-09-30 VITALS — BP 128/74 | HR 81 | Ht 63.5 in | Wt 179.5 lb

## 2016-09-30 DIAGNOSIS — D72829 Elevated white blood cell count, unspecified: Secondary | ICD-10-CM | POA: Insufficient documentation

## 2016-09-30 DIAGNOSIS — N950 Postmenopausal bleeding: Secondary | ICD-10-CM

## 2016-09-30 NOTE — Progress Notes (Signed)
GYN ENCOUNTER NOTE  Subjective:       Brandy Johnston is a 55 y.o. 562P2002 female is here for gynecologic evaluation of the following issues:  1. Postmenopausal bleeding.    Patient went through menopause approximately age 89. She has never been on any hormone replacement therapy. She is not experiencing any vasomotor symptoms. Patient is not on any anticoagulants. She is on aspirin 81 mg a day. 08/19/2016 Pap smear/HPV is negative/negative. She is currently in a same-sex monogamous relationship.  Patient does not report any bowel or bladder dysfunction at this time. She did have a colonoscopy approximately 5 years ago.   Gynecologic History No LMP recorded. Patient is postmenopausal. Contraception: post menopausal status   Obstetric History OB History  Gravida Para Term Preterm AB Living  2 2 2     2   SAB TAB Ectopic Multiple Live Births          2    # Outcome Date GA Lbr Len/2nd Weight Sex Delivery Anes PTL Lv  2 Term 1989   6 lb (2.722 kg) M Vag-Spont   LIV  1 Term 1986   8 lb (3.629 kg) F Vag-Spont   LIV      Past Medical History:  Diagnosis Date  . Anemia   . Chronic renal impairment, stage 3 (moderate)   . Diabetes mellitus without complication (HCC)    No longer on meds  . GERD (gastroesophageal reflux disease)   . Hyperlipidemia   . Hypertension   . Hypokalemia   . Insomnia   . Low calcium levels   . Osteoarthrosis, hip    right hip  . Paresthesia   . Sickle cell anemia (HCC)   . Sickle-cell trait (HCC)   . Tension headache    1x/mo  . Wears contact lenses     Past Surgical History:  Procedure Laterality Date  . CHOLECYSTECTOMY    . COLONOSCOPY    . FRACTURE SURGERY Left    cast and pins   . TUBAL LIGATION      Current Outpatient Prescriptions on File Prior to Visit  Medication Sig Dispense Refill  . amLODipine-valsartan (EXFORGE) 5-160 MG tablet Take 1 tablet by mouth daily. 30 tablet 5  . aspirin 81 MG chewable tablet Chew 1 tablet by mouth  as needed.    Marland Kitchen. atorvastatin (LIPITOR) 40 MG tablet Take 1 tablet (40 mg total) by mouth every evening. 90 tablet 1  . Butalbital-APAP-Caffeine 50-300-40 MG CAPS take 1 capsule by mouth every 6 hours if needed 40 capsule 0  . cyclobenzaprine (FLEXERIL) 5 MG tablet take 1 tablet by mouth every 8 hours if needed 90 tablet 2  . gabapentin (NEURONTIN) 300 MG capsule Take 1 capsule (300 mg total) by mouth 2 (two) times daily. 180 capsule 1  . magnesium oxide (MAG-OX) 400 MG tablet Take 1 tablet by mouth 2 (two) times daily.    . meloxicam (MOBIC) 7.5 MG tablet Take 1 tablet (7.5 mg total) by mouth daily. 30 tablet 2  . omeprazole (PRILOSEC) 20 MG capsule Take 1 capsule (20 mg total) by mouth 2 (two) times daily before a meal. 60 capsule 2  . ondansetron (ZOFRAN) 4 MG tablet Take 1 tablet (4 mg total) by mouth every 8 (eight) hours as needed. 20 tablet 0  . temazepam (RESTORIL) 15 MG capsule Take 1 capsule (15 mg total) by mouth as needed. 30 capsule 2  . traMADol (ULTRAM) 50 MG tablet Take 1 tablet (50 mg  total) by mouth every 12 (twelve) hours as needed. For pain 60 tablet 2  . Vitamin D, Ergocalciferol, (DRISDOL) 50000 units CAPS capsule Take 1 capsule (50,000 Units total) by mouth every 7 (seven) days. 12 capsule 0   No current facility-administered medications on file prior to visit.     No Known Allergies  Social History   Social History  . Marital status: Single    Spouse name: N/A  . Number of children: N/A  . Years of education: N/A   Occupational History  . Not on file.   Social History Main Topics  . Smoking status: Never Smoker  . Smokeless tobacco: Never Used  . Alcohol use 0.0 oz/week     Comment: rare  . Drug use:     Types: Marijuana     Comment: smokes marijuana occasionally  . Sexual activity: Yes    Birth control/ protection: Other-see comments     Comment: homosexual    Other Topics Concern  . Not on file   Social History Narrative  . No narrative on file     Family History  Problem Relation Age of Onset  . Migraines Mother   . Diabetes Mother   . Cancer Mother     lung  . Arthritis Brother   . Breast cancer Maternal Aunt   . Cancer Maternal Uncle     Lung and Colon  . Cirrhosis Brother   . Breast cancer Cousin     The following portions of the patient's history were reviewed and updated as appropriate: allergies, current medications, past family history, past medical history, past social history, past surgical history and problem list.  Review of Systems Review of Systems - Per history of present illness  Objective:   BP 128/74   Pulse 81   Ht 5' 3.5" (1.613 m)   Wt 179 lb 8 oz (81.4 kg)   BMI 31.30 kg/m  CONSTITUTIONAL: Well-developed, well-nourished female in no acute distress.  HENT:  Normocephalic, atraumatic.  NECK:Not examined SKIN: Skin is warm and dry. No rash noted. Not diaphoretic. No erythema. No pallor. NEUROLGIC: Alert and oriented to person, place, and time. PSYCHIATRIC: Normal mood and affect. Normal behavior. Normal judgment and thought content. CARDIOVASCULAR:Not Examined RESPIRATORY: Not Examined BREASTS: Not Examined ABDOMEN: Soft, non distended; Non tender.  No Organomegaly. PELVIC:  External Genitalia: Normal  BUS: Normal  Vagina: Normal; good estrogen effect  Cervix: Normal; parous; no lesions  Uterus: Normal size, shape,consistency, mobile, midplane, nontender  Adnexa: Normal; nonpalpable and nontender  RV: Normal external exam  Bladder: Nontender MUSCULOSKELETAL: Normal range of motion. No tenderness.  No cyanosis, clubbing, or edema.   Ultrasound is performed today and reveals a 1.7 mm endometrial stripe. Therefore, no need for endometrial biopsy  Assessment:   1. Postmenopausal bleeding; no GENITAL tract source for bleeding is identified      Plan:   1. Ultrasound is completed today as noted 2. Return for reevaluation if bleeding develops after 03/28/2017 (greater than 6 months  from this evaluation) 3. Consider during workup of GU and GI tract if perineal bleeding is noted to persist.  A total of 30 minutes were spent face-to-face with the patient during the encounter with greater than 50% dealing with counseling and coordination of care.  Herold Harms, MD  Note: This dictation was prepared with Dragon dictation along with smaller phrase technology. Any transcriptional errors that result from this process are unintentional.

## 2016-09-30 NOTE — Progress Notes (Signed)
[facility]  [phone] [phone]  ID: Brandy Johnston OB: 1961-07-30  MR#: [id number]  BWG#:[id number]  Patient Care Team: Steele Sizer, MD as PCP - General (Family Medicine)  CHIEF COMPLAINT: Leukocytosis, unspecified  INTERVAL HISTORY: Patient is a 55 year old female who was noted to have a persistently elevated white blood cell, routine blood work. Currently, she feels well and is asymptomatic. Patient states she was sick "the entire month of August", but has felt fine and at her baseline since then. She denies any fevers. She has a good appetite and denies any weight loss. She denies any night sweats. She has no neurologic complaints. She denies any chest pain or shortness of breath. She has no nausea, vomiting, constipation, or diarrhea. She has no urinary complaints. Patient offers no specific complaints today.  REVIEW OF SYSTEMS:   Review of Systems  Constitutional: Negative.  Negative for fever, malaise/fatigue and weight loss.  Respiratory: Negative.  Negative for cough and shortness of breath.   Cardiovascular: Negative.  Negative for chest pain and leg swelling.  Gastrointestinal: Negative.  Negative for abdominal pain.  Genitourinary: Negative.   Musculoskeletal: Negative.   Neurological: Negative.  Negative for weakness.  Psychiatric/Behavioral: Negative.  The patient is not nervous/anxious.     As per HPI. Otherwise, a complete review of systems is negative.  PAST MEDICAL HISTORY: Past Medical History:  Diagnosis Date  . Anemia   . Chronic renal impairment, stage 3 (moderate)   . Diabetes mellitus without complication ([place])    No longer on meds  . GERD (gastroesophageal reflux disease)   . Hyperlipidemia   . Hypertension   . Hypokalemia   . Insomnia   . Low calcium levels   . Osteoarthrosis, hip    right hip  . Paresthesia   . Sickle cell anemia (HCC)   . Sickle-cell trait ([place])   . Tension headache    1x/mo  .  Wears contact lenses     PAST SURGICAL HISTORY: Past Surgical History:  Procedure Laterality Date  . CHOLECYSTECTOMY    . COLONOSCOPY    . FRACTURE SURGERY Left    cast and pins   . TUBAL LIGATION      FAMILY HISTORY: Family History  Problem Relation Age of Onset  . Migraines Mother   . Diabetes Mother   . Cancer Mother     lung  . Arthritis Brother   . Breast cancer Maternal Aunt   . Cancer Maternal Uncle     Lung and Colon  . Cirrhosis Brother   . Breast cancer Cousin     ADVANCED DIRECTIVES (Y/N):  N  HEALTH MAINTENANCE: Social History  Substance Use Topics  . Smoking status: Never Smoker  . Smokeless tobacco: Never Used  . Alcohol use 0.0 oz/week     Comment: rare     Colonoscopy:  PAP:  Bone density:  Lipid panel:  No Known Allergies  Current Outpatient Prescriptions  Medication Sig Dispense Refill  . amLODipine (NORVASC) 10 MG tablet Take 10 mg by mouth daily.    Marland Kitchen aspirin 81 MG chewable tablet Chew 1 tablet by mouth as needed.    . Butalbital-APAP-Caffeine 50-300-40 MG CAPS take 1 capsule by mouth every 6 hours if needed (Patient taking differently: take 1 capsule by mouth every 4 hours if needed) 40 capsule 0  . cyclobenzaprine (FLEXERIL) 5 MG tablet take 1 tablet by mouth every 8 hours if needed 90 tablet 2  . gabapentin (NEURONTIN) 300  MG capsule Take 1 capsule (300 mg total) by mouth 2 (two) times daily. 180 capsule 1  . magnesium oxide (MAG-OX) 400 MG tablet Take 1 tablet by mouth 2 (two) times daily.    . meloxicam (MOBIC) 7.5 MG tablet Take 1 tablet (7.5 mg total) by mouth daily. 30 tablet 2  . ondansetron (ZOFRAN) 4 MG tablet Take 1 tablet (4 mg total) by mouth every 8 (eight) hours as needed. 20 tablet 0  . temazepam (RESTORIL) 15 MG capsule Take 1 capsule (15 mg total) by mouth as needed. 30 capsule 2  . traMADol (ULTRAM) 50 MG tablet Take 1 tablet (50 mg total) by mouth every 12 (twelve) hours as needed. For pain 60 tablet 2  . Vitamin D,  Ergocalciferol, (DRISDOL) 50000 units CAPS capsule Take 1 capsule (50,000 Units total) by mouth every 7 (seven) days. 12 capsule 0   No current facility-administered medications for this visit.     OBJECTIVE: Vitals:   10/01/16 0944  BP: (!) 146/82  Pulse: 73  Resp: 18  Temp: (!) 95 F (35 C)     Body mass index is 31.44 kg/m.    ECOG FS:0 - Asymptomatic  General: Well-developed, well-nourished, no acute distress. Eyes: Pink conjunctiva, anicteric sclera. HEENT: Normocephalic, moist mucous membranes, clear oropharnyx. Lungs: Clear to auscultation bilaterally. Heart: Regular rate and rhythm. No rubs, murmurs, or gallops. Abdomen: Soft, nontender, nondistended. No organomegaly noted, normoactive bowel sounds. Musculoskeletal: No edema, cyanosis, or clubbing. Neuro: Alert, answering all questions appropriately. Cranial nerves grossly intact. Skin: No rashes or petechiae noted. Psych: Normal affect. Lymphatics: No cervical, calvicular, axillary or inguinal LAD.   LAB RESULTS:  Lab Results  Component Value Date   NA 140 08/28/2016   K 4.3 08/28/2016   CL 106 08/28/2016   CO2 24 08/28/2016   GLUCOSE 121 (H) 08/28/2016   BUN 19 08/28/2016   CREATININE 1.39 (H) 08/28/2016   CALCIUM 8.9 08/28/2016   PROT 7.3 08/28/2016   ALBUMIN 4.0 08/28/2016   AST 19 08/28/2016   ALT 23 08/28/2016   ALKPHOS 125 08/28/2016   BILITOT 0.2 08/28/2016   GFRNONAA 42 (L) 08/28/2016   GFRAA 49 (L) 08/28/2016    Lab Results  Component Value Date   WBC 9.7 10/01/2016   NEUTROABS 6.2 10/01/2016   HGB 11.8 (L) 10/01/2016   HCT 35.3 10/01/2016   MCV 80.8 10/01/2016   PLT 233 10/01/2016     STUDIES: US Transvaginal Non-ob  Result Date: 09/30/2016 ULTRASOUND REPORT Location: [facility] Date of Service: 09/30/16 Indications: Post Menopausal Bleeding Findings: The uterus appears WNL and measures 7.0 x 3.4 x 4.1 cm. Echo texture is homogenous without evidence of focal masses. The  Endometrium is thin and echogenic and measures 1.8 mm. There is a trace of free fluid within the endometrial canal. Right Ovary measures 2.5 x 1.4 x 1.7 cm, and appears WNL. Left Ovary measures 2.5 x 1.1 x 1.5 cm. There is a 1.0 cm dominant follicle seen, which appears WNL. Survey of the adnexa demonstrates no adnexal masses. There is no free fluid in the cul de sac. Impression: 1. Normal appearing pelvic ultrasound with a thin, echogenic endometrium. A trace of free fluid is seen within the endometrial canal which is most likely physiologic. Recommendations: 1.Clinical correlation with the patient's History and Physical Exam. Macarthur Critchley, RDMS, RVT Brayton Mars, MD   Mm Digital Screening  Result Date: 09/16/2016 CLINICAL DATA:  Screening. EXAM: DIGITAL SCREENING BILATERAL MAMMOGRAM WITH  CAD COMPARISON:  Previous exam(s). ACR Breast Density Category b: There are scattered areas of fibroglandular density. FINDINGS: There are no findings suspicious for malignancy. Images were processed with CAD. IMPRESSION: No mammographic evidence of malignancy. A result letter of this screening mammogram will be mailed directly to the patient. RECOMMENDATION: Screening mammogram in one year. (Code:SM-B-01Y) BI-RADS CATEGORY  1: Negative. Electronically Signed   By: Fidela Salisbury M.D.   On: 09/16/2016 12:56    ASSESSMENT: Leukocytosis, unspecified  PLAN:    1. Leukocytosis, unspecified: Patient's white blood cell count is within normal limits today. Her recent elevation was possibly reactive. SPEP, BCR-ABL, and peripheral blood flow cytometry were ordered and are pending at time of dictation. No intervention is needed at this time. Patient does not require bone marrow biopsy. Return to clinic in 3 months with repeat laboratory work and further evaluation. If all of her labs are negative and her white count remains within normal limits, she likely can be discharged from clinic at that time. 2. Anemia:  Mild, monitor. 3. Renal insufficiency: Unclear patient's baseline, monitor.  Patient expressed understanding and was in agreement with this plan. She also understands that She can call clinic at any time with any questions, concerns, or complaints.   No matching staging information was found for the patient.  Lloyd Huger, MD   10/02/2016 4:32 PM

## 2016-09-30 NOTE — Patient Instructions (Addendum)
1. Gynecologic evaluation was negative for any source of bleeding from the genital tract. 2. Return  if a recurrent episode of postmenopausal bleeding occurs after 03/28/2017 3. If episodes of genital tract spotting recurred, consider evaluation from urology and gastroenterology to rule out those organ systems as possible specific sources of the bleeding.

## 2016-10-01 ENCOUNTER — Inpatient Hospital Stay: Payer: BLUE CROSS/BLUE SHIELD

## 2016-10-01 ENCOUNTER — Inpatient Hospital Stay: Payer: BLUE CROSS/BLUE SHIELD | Attending: Oncology | Admitting: Oncology

## 2016-10-01 ENCOUNTER — Encounter: Payer: Self-pay | Admitting: Oncology

## 2016-10-01 VITALS — BP 146/82 | HR 73 | Temp 95.0°F | Resp 18 | Wt 180.3 lb

## 2016-10-01 DIAGNOSIS — E785 Hyperlipidemia, unspecified: Secondary | ICD-10-CM | POA: Insufficient documentation

## 2016-10-01 DIAGNOSIS — Z801 Family history of malignant neoplasm of trachea, bronchus and lung: Secondary | ICD-10-CM | POA: Diagnosis not present

## 2016-10-01 DIAGNOSIS — I129 Hypertensive chronic kidney disease with stage 1 through stage 4 chronic kidney disease, or unspecified chronic kidney disease: Secondary | ICD-10-CM | POA: Diagnosis not present

## 2016-10-01 DIAGNOSIS — D649 Anemia, unspecified: Secondary | ICD-10-CM | POA: Diagnosis not present

## 2016-10-01 DIAGNOSIS — D72829 Elevated white blood cell count, unspecified: Secondary | ICD-10-CM | POA: Insufficient documentation

## 2016-10-01 DIAGNOSIS — Z7982 Long term (current) use of aspirin: Secondary | ICD-10-CM | POA: Diagnosis not present

## 2016-10-01 DIAGNOSIS — Z803 Family history of malignant neoplasm of breast: Secondary | ICD-10-CM | POA: Insufficient documentation

## 2016-10-01 DIAGNOSIS — Z79899 Other long term (current) drug therapy: Secondary | ICD-10-CM | POA: Diagnosis not present

## 2016-10-01 DIAGNOSIS — N183 Chronic kidney disease, stage 3 (moderate): Secondary | ICD-10-CM

## 2016-10-01 DIAGNOSIS — M1611 Unilateral primary osteoarthritis, right hip: Secondary | ICD-10-CM | POA: Insufficient documentation

## 2016-10-01 DIAGNOSIS — E876 Hypokalemia: Secondary | ICD-10-CM | POA: Insufficient documentation

## 2016-10-01 DIAGNOSIS — D573 Sickle-cell trait: Secondary | ICD-10-CM | POA: Diagnosis not present

## 2016-10-01 DIAGNOSIS — K219 Gastro-esophageal reflux disease without esophagitis: Secondary | ICD-10-CM

## 2016-10-01 DIAGNOSIS — Z800 Family history of malignant neoplasm of digestive organs: Secondary | ICD-10-CM | POA: Insufficient documentation

## 2016-10-01 DIAGNOSIS — E1122 Type 2 diabetes mellitus with diabetic chronic kidney disease: Secondary | ICD-10-CM

## 2016-10-01 LAB — CBC WITH DIFFERENTIAL/PLATELET
Basophils Absolute: 0.1 10*3/uL (ref 0–0.1)
Basophils Relative: 1 %
Eosinophils Absolute: 0.2 10*3/uL (ref 0–0.7)
Eosinophils Relative: 2 %
HCT: 35.3 % (ref 35.0–47.0)
Hemoglobin: 11.8 g/dL — ABNORMAL LOW (ref 12.0–16.0)
Lymphocytes Relative: 28 %
Lymphs Abs: 2.7 10*3/uL (ref 1.0–3.6)
MCH: 27 pg (ref 26.0–34.0)
MCHC: 33.4 g/dL (ref 32.0–36.0)
MCV: 80.8 fL (ref 80.0–100.0)
Monocytes Absolute: 0.5 10*3/uL (ref 0.2–0.9)
Monocytes Relative: 5 %
Neutro Abs: 6.2 10*3/uL (ref 1.4–6.5)
Neutrophils Relative %: 64 %
Platelets: 233 10*3/uL (ref 150–440)
RBC: 4.36 MIL/uL (ref 3.80–5.20)
RDW: 15.3 % — ABNORMAL HIGH (ref 11.5–14.5)
WBC: 9.7 10*3/uL (ref 3.6–11.0)

## 2016-10-01 NOTE — Progress Notes (Signed)
New evaluation for leukocytosis. States is having pain in right shoulder. Scheduled for GI procedure next week.

## 2016-10-02 ENCOUNTER — Ambulatory Visit: Payer: BLUE CROSS/BLUE SHIELD | Admitting: Oncology

## 2016-10-02 LAB — PROTEIN ELECTROPHORESIS, SERUM
A/G Ratio: 0.9 (ref 0.7–1.7)
Albumin ELP: 3.5 g/dL (ref 2.9–4.4)
Alpha-1-Globulin: 0.3 g/dL (ref 0.0–0.4)
Alpha-2-Globulin: 0.7 g/dL (ref 0.4–1.0)
Beta Globulin: 1.3 g/dL (ref 0.7–1.3)
Gamma Globulin: 1.5 g/dL (ref 0.4–1.8)
Globulin, Total: 3.8 g/dL (ref 2.2–3.9)
Total Protein ELP: 7.3 g/dL (ref 6.0–8.5)

## 2016-10-02 NOTE — Discharge Instructions (Signed)

## 2016-10-05 ENCOUNTER — Ambulatory Visit
Admission: RE | Admit: 2016-10-05 | Discharge: 2016-10-05 | Disposition: A | Discharge status: Home / Self Care | Payer: BLUE CROSS/BLUE SHIELD | Source: Ambulatory Visit | Attending: Gastroenterology | Admitting: Gastroenterology

## 2016-10-05 ENCOUNTER — Encounter: Payer: Self-pay | Admitting: Family Medicine

## 2016-10-05 ENCOUNTER — Ambulatory Visit: Payer: BLUE CROSS/BLUE SHIELD | Admitting: Anesthesiology

## 2016-10-05 ENCOUNTER — Encounter
Admission: RE | Disposition: A | Discharge status: Home / Self Care | Payer: Self-pay | Source: Ambulatory Visit | Attending: Gastroenterology

## 2016-10-05 ENCOUNTER — Encounter: Payer: Self-pay | Admitting: Anesthesiology

## 2016-10-05 DIAGNOSIS — E1122 Type 2 diabetes mellitus with diabetic chronic kidney disease: Secondary | ICD-10-CM | POA: Diagnosis not present

## 2016-10-05 DIAGNOSIS — N183 Chronic kidney disease, stage 3 (moderate): Secondary | ICD-10-CM | POA: Insufficient documentation

## 2016-10-05 DIAGNOSIS — Z79899 Other long term (current) drug therapy: Secondary | ICD-10-CM | POA: Diagnosis not present

## 2016-10-05 DIAGNOSIS — K2930 Chronic superficial gastritis without bleeding: Secondary | ICD-10-CM | POA: Diagnosis not present

## 2016-10-05 DIAGNOSIS — Z6831 Body mass index (BMI) 31.0-31.9, adult: Secondary | ICD-10-CM | POA: Diagnosis not present

## 2016-10-05 DIAGNOSIS — K2950 Unspecified chronic gastritis without bleeding: Secondary | ICD-10-CM | POA: Diagnosis not present

## 2016-10-05 DIAGNOSIS — E1142 Type 2 diabetes mellitus with diabetic polyneuropathy: Secondary | ICD-10-CM | POA: Insufficient documentation

## 2016-10-05 DIAGNOSIS — K2900 Acute gastritis without bleeding: Secondary | ICD-10-CM

## 2016-10-05 DIAGNOSIS — Z791 Long term (current) use of non-steroidal anti-inflammatories (NSAID): Secondary | ICD-10-CM | POA: Insufficient documentation

## 2016-10-05 DIAGNOSIS — R6881 Early satiety: Secondary | ICD-10-CM

## 2016-10-05 DIAGNOSIS — I129 Hypertensive chronic kidney disease with stage 1 through stage 4 chronic kidney disease, or unspecified chronic kidney disease: Secondary | ICD-10-CM | POA: Diagnosis not present

## 2016-10-05 DIAGNOSIS — Z8719 Personal history of other diseases of the digestive system: Secondary | ICD-10-CM

## 2016-10-05 DIAGNOSIS — G44209 Tension-type headache, unspecified, not intractable: Secondary | ICD-10-CM | POA: Insufficient documentation

## 2016-10-05 HISTORY — PX: ESOPHAGOGASTRODUODENOSCOPY (EGD) WITH PROPOFOL: SHX5813

## 2016-10-05 HISTORY — DX: Presence of spectacles and contact lenses: Z97.3

## 2016-10-05 HISTORY — DX: Gastro-esophageal reflux disease without esophagitis: K21.9

## 2016-10-05 HISTORY — DX: Anemia, unspecified: D64.9

## 2016-10-05 SURGERY — ESOPHAGOGASTRODUODENOSCOPY (EGD) WITH PROPOFOL
Anesthesia: Monitor Anesthesia Care | Wound class: Clean Contaminated

## 2016-10-05 MED ORDER — LACTATED RINGERS IV SOLN
INTRAVENOUS | Status: DC
  Administered 2016-10-05: 10:00:00 via INTRAVENOUS

## 2016-10-05 MED ORDER — OXYCODONE HCL 5 MG/5ML PO SOLN: 5.0000 mg | Freq: Once | ORAL | Status: DC | PRN

## 2016-10-05 MED ORDER — GLYCOPYRROLATE 0.2 MG/ML IJ SOLN
INTRAMUSCULAR | Status: DC | PRN
  Administered 2016-10-05: 0.2 mg via INTRAVENOUS

## 2016-10-05 MED ORDER — PROPOFOL 10 MG/ML IV BOLUS
INTRAVENOUS | Status: DC | PRN
  Administered 2016-10-05 (×3): 50 mg via INTRAVENOUS

## 2016-10-05 MED ORDER — ONDANSETRON HCL 4 MG/2ML IJ SOLN
INTRAMUSCULAR | Status: DC | PRN
  Administered 2016-10-05: 4 mg via INTRAVENOUS

## 2016-10-05 MED ORDER — LIDOCAINE HCL (CARDIAC) 20 MG/ML IV SOLN
INTRAVENOUS | Status: DC | PRN
  Administered 2016-10-05: 40 mg via INTRAVENOUS

## 2016-10-05 MED ORDER — STERILE WATER FOR IRRIGATION IR SOLN
Status: DC | PRN
  Administered 2016-10-05: 11:00:00

## 2016-10-05 MED ORDER — OXYCODONE HCL 5 MG PO TABS: 5.0000 mg | ORAL_TABLET | Freq: Once | ORAL | Status: DC | PRN

## 2016-10-05 SURGICAL SUPPLY — 32 items
BALLN DILATOR 10-12 8 (BALLOONS)
BALLN DILATOR 12-15 8 (BALLOONS)
BALLN DILATOR 15-18 8 (BALLOONS)
BALLN DILATOR CRE 0-12 8 (BALLOONS)
BALLN DILATOR ESOPH 8 10 CRE (MISCELLANEOUS) IMPLANT
BALLOON DILATOR 12-15 8 (BALLOONS) IMPLANT
BALLOON DILATOR 15-18 8 (BALLOONS) IMPLANT
BALLOON DILATOR CRE 0-12 8 (BALLOONS) IMPLANT
BLOCK BITE 60FR ADLT L/F GRN (MISCELLANEOUS) ×2 IMPLANT
CANISTER SUCT 1200ML W/VALVE (MISCELLANEOUS) ×2 IMPLANT
CLIP HMST 235XBRD CATH ROT (MISCELLANEOUS) IMPLANT
CLIP RESOLUTION 360 11X235 (MISCELLANEOUS)
FCP ESCP3.2XJMB 240X2.8X (MISCELLANEOUS)
FORCEPS BIOP RAD 4 LRG CAP 4 (CUTTING FORCEPS) ×2 IMPLANT
FORCEPS BIOP RJ4 240 W/NDL (MISCELLANEOUS)
FORCEPS ESCP3.2XJMB 240X2.8X (MISCELLANEOUS) IMPLANT
GOWN CVR UNV OPN BCK APRN NK (MISCELLANEOUS) ×2 IMPLANT
GOWN ISOL THUMB LOOP REG UNIV (MISCELLANEOUS) ×2
INJECTOR VARIJECT VIN23 (MISCELLANEOUS) IMPLANT
KIT DEFENDO VALVE AND CONN (KITS) IMPLANT
KIT ENDO PROCEDURE OLY (KITS) ×2 IMPLANT
MARKER SPOT ENDO TATTOO 5ML (MISCELLANEOUS) IMPLANT
PAD GROUND ADULT SPLIT (MISCELLANEOUS) IMPLANT
RETRIEVER NET PLAT FOOD (MISCELLANEOUS) IMPLANT
SNARE SHORT THROW 13M SML OVAL (MISCELLANEOUS) IMPLANT
SNARE SHORT THROW 30M LRG OVAL (MISCELLANEOUS) IMPLANT
SPOT EX ENDOSCOPIC TATTOO (MISCELLANEOUS)
SYR INFLATION 60ML (SYRINGE) IMPLANT
TRAP ETRAP POLY (MISCELLANEOUS) IMPLANT
VARIJECT INJECTOR VIN23 (MISCELLANEOUS)
WATER STERILE IRR 250ML POUR (IV SOLUTION) ×2 IMPLANT
WIRE CRE 18-20MM 8CM F G (MISCELLANEOUS) IMPLANT

## 2016-10-05 NOTE — Op Note (Signed)
[facility] Gastroenterology Patient Name: Brandy ScarceLinda Johnston Procedure Date: 10/05/2016 10:41 AM MRN: [id number] Account #: [id number] Date of Birth: 08-Dec-1961 Admit Type: Outpatient Age: [age] [facility] Gender: Female Note Status: Finalized Procedure:            Upper GI endoscopy Indications:          Early satiety Providers:            Midge Miniumarren Teresita Fanton MD, MD Referring MD:         [name], MD (Referring MD) Medicines:            Propofol per Anesthesia Complications:        No immediate complications. Procedure:            Pre-Anesthesia Assessment:                       - Prior to the procedure, a History and Physical was                        performed, and patient medications and allergies were                        reviewed. The patient's tolerance of previous                        anesthesia was also reviewed. The risks and benefits of                        the procedure and the sedation options and risks were                        discussed with the patient. All questions were                        answered, and informed consent was obtained. Prior                        Anticoagulants: The patient has taken no previous                        anticoagulant or antiplatelet agents. ASA Grade                        Assessment: II - A patient with mild systemic disease.                        After reviewing the risks and benefits, the patient was                        deemed in satisfactory condition to undergo the                        procedure.                       After obtaining informed consent, the endoscope was                        passed under direct vision. Throughout the procedure,  the patient's blood pressure, pulse, and oxygen                        saturations were monitored continuously. The Olympus                        [id number]-[id number] Endoscope (S#. [id number]) was introduced                        through  the mouth, and advanced to the second part of                        duodenum. The upper GI endoscopy was accomplished                        without difficulty. The patient tolerated the procedure                        well. Findings:      The examined esophagus was normal.      Localized mild inflammation characterized by erythema was found in the       gastric antrum. Biopsies were taken with a cold forceps for histology.      The examined duodenum was normal. Impression:           - Normal esophagus.                       - Gastritis. Biopsied.                       - Normal examined duodenum. Recommendation:       - Discharge patient to home.                       - Resume previous diet.                       - Continue present medications.                       - Await pathology results. Procedure Code(s):    --- Professional ---                       [phone], Esophagogastroduodenoscopy, flexible, transoral;                        with biopsy, single or multiple Diagnosis Code(s):    --- Professional ---                       R68.81, Early satiety                       K29.70, Gastritis, unspecified, without bleeding CPT copyright 2016 American Medical Association. All rights reserved. The codes documented in this report are preliminary and upon coder review may  be revised to meet current compliance requirements. Midge Minium MD, MD 10/05/2016 10:55:48 AM This report has been signed electronically. Number of Addenda: 0 Note Initiated On: 10/05/2016 10:41 AM Total Procedure Duration: 0 hours 2 minutes 37 seconds       [facility]

## 2016-10-05 NOTE — Anesthesia Preprocedure Evaluation (Signed)
Anesthesia Evaluation  Patient identified by MRN, date of birth, ID band  Reviewed: NPO status   History of Anesthesia Complications Negative for: history of anesthetic complications  Airway Mallampati: II  TM Distance: >3 FB Neck ROM: full    Dental  (+) Missing   Pulmonary neg pulmonary ROS,    Pulmonary exam normal        Cardiovascular Exercise Tolerance: Good hypertension, Normal cardiovascular exam     Neuro/Psych  Headaches, Neuropathy legs  negative psych ROS   GI/Hepatic Neg liver ROS, GERD  ,  Endo/Other  diabetes (diet)Morbid obesity (bmi=31;)  Renal/GU CRFRenal disease (ckd3)  negative genitourinary   Musculoskeletal  (+) Arthritis ,   Abdominal   Peds  Hematology  (+) anemia , Leukocytosis;  Sickle cell trait;     Anesthesia Other Findings   Reproductive/Obstetrics                             Anesthesia Physical Anesthesia Plan  ASA: III  Anesthesia Plan: MAC   Post-op Pain Management:    Induction:   Airway Management Planned:   Additional Equipment:   Intra-op Plan:   Post-operative Plan:   Informed Consent: I have reviewed the patients History and Physical, chart, labs and discussed the procedure including the risks, benefits and alternatives for the proposed anesthesia with the patient or authorized representative who has indicated his/her understanding and acceptance.     Plan Discussed with: CRNA  Anesthesia Plan Comments:         Anesthesia Quick Evaluation

## 2016-10-05 NOTE — Transfer of Care (Signed)
Immediate Anesthesia Transfer of Care Note  Patient: Brandy CheeksLinda D Johnston  Procedure(s) Performed: Procedure(s): ESOPHAGOGASTRODUODENOSCOPY (EGD) WITH PROPOFOL (N/A)  Patient Location: PACU  Anesthesia Type: MAC  Level of Consciousness: awake, alert  and patient cooperative  Airway and Oxygen Therapy: Patient Spontanous Breathing and Patient connected to supplemental oxygen  Post-op Assessment: Post-op Vital signs reviewed, Patient's Cardiovascular Status Stable, Respiratory Function Stable, Patent Airway and No signs of Nausea or vomiting  Post-op Vital Signs: Reviewed and stable  Complications: No apparent anesthesia complications

## 2016-10-05 NOTE — H&P (Signed)
Midge Minium, MD [facility] [address] [place], [place] [address] Phone: [phone] Fax : [phone]  Primary Care Physician:  Ruel Favors, MD Primary Gastroenterologist:  Dr. Servando Snare  Pre-Procedure History & Physical: HPI:  BLIMIE VANESS is a 55 y.o. female is here for an endoscopy.   Past Medical History:  Diagnosis Date  . Anemia   . Chronic renal impairment, stage 3 (moderate)   . Diabetes mellitus without complication (HCC)    No longer on meds  . GERD (gastroesophageal reflux disease)   . Hyperlipidemia   . Hypertension   . Hypokalemia   . Insomnia   . Low calcium levels   . Osteoarthrosis, hip    right hip  . Paresthesia   . Sickle cell anemia (HCC)   . Sickle-cell trait (HCC)   . Tension headache    1x/mo  . Wears contact lenses     Past Surgical History:  Procedure Laterality Date  . CHOLECYSTECTOMY    . COLONOSCOPY    . FRACTURE SURGERY Left    cast and pins   . TUBAL LIGATION      Prior to Admission medications   Medication Sig Start Date End Date Taking? Authorizing Provider  amLODipine (NORVASC) 10 MG tablet Take 10 mg by mouth daily.   Yes Historical Provider, MD  aspirin 81 MG chewable tablet Chew 1 tablet by mouth as needed. 05/27/10  Yes Historical Provider, MD  Butalbital-APAP-Caffeine 50-300-40 MG CAPS take 1 capsule by mouth every 6 hours if needed Patient taking differently: take 1 capsule by mouth every 4 hours if needed 07/23/16  Yes Alba Cory, MD  cyclobenzaprine (FLEXERIL) 5 MG tablet take 1 tablet by mouth every 8 hours if needed 06/15/16  Yes Alba Cory, MD  gabapentin (NEURONTIN) 300 MG capsule Take 1 capsule (300 mg total) by mouth 2 (two) times daily. 07/23/16  Yes Alba Cory, MD  magnesium oxide (MAG-OX) 400 MG tablet Take 1 tablet by mouth 2 (two) times daily. 11/05/14  Yes Historical Provider, MD  meloxicam (MOBIC) 7.5 MG tablet Take 1 tablet (7.5 mg total) by mouth daily. 07/23/16  Yes Alba Cory, MD    ondansetron (ZOFRAN) 4 MG tablet Take 1 tablet (4 mg total) by mouth every 8 (eight) hours as needed. 08/17/16  Yes Phineas Semen, MD  temazepam (RESTORIL) 15 MG capsule Take 1 capsule (15 mg total) by mouth as needed. 07/23/16  Yes Alba Cory, MD  traMADol (ULTRAM) 50 MG tablet Take 1 tablet (50 mg total) by mouth every 12 (twelve) hours as needed. For pain 07/23/16  Yes Alba Cory, MD  Vitamin D, Ergocalciferol, (DRISDOL) 50000 units CAPS capsule Take 1 capsule (50,000 Units total) by mouth every 7 (seven) days. 09/16/16  Yes Alba Cory, MD    Allergies as of 09/24/2016  . (No Known Allergies)    Family History  Problem Relation Age of Onset  . Migraines Mother   . Diabetes Mother   . Cancer Mother     lung  . Arthritis Brother   . Breast cancer Maternal Aunt   . Cancer Maternal Uncle     Lung and Colon  . Cirrhosis Brother   . Breast cancer Cousin     Social History   Social History  . Marital status: Single    Spouse name: N/A  . Number of children: N/A  . Years of education: N/A   Occupational History  . Not on file.   Social History Main Topics  .  Smoking status: Never Smoker  . Smokeless tobacco: Never Used  . Alcohol use 0.0 oz/week     Comment: rare  . Drug use:     Types: Marijuana     Comment: smokes marijuana occasionally  . Sexual activity: Yes    Birth control/ protection: Other-see comments     Comment: homosexual    Other Topics Concern  . Not on file   Social History Narrative  . No narrative on file    Review of Systems: See HPI, otherwise negative ROS  Physical Exam: BP 117/74   Pulse 74   Temp 97.5 F (36.4 C) (Temporal)   Resp 16   Ht 5\' 3"  (1.6 m)   Wt 177 lb (80.3 kg)   SpO2 99%   BMI 31.35 kg/m  General:   Alert,  pleasant and cooperative in NAD Head:  Normocephalic and atraumatic. Neck:  Supple; no masses or thyromegaly. Lungs:  Clear throughout to auscultation.    Heart:  Regular rate and rhythm. Abdomen:   Soft, nontender and nondistended. Normal bowel sounds, without guarding, and without rebound.   Neurologic:  Alert and  oriented x4;  grossly normal neurologically.  Impression/Plan: Osie CheeksLinda D Zurawski is here for an endoscopy to be performed for early satiety  Risks, benefits, limitations, and alternatives regarding  endoscopy have been reviewed with the patient.  Questions have been answered.  All parties agreeable.   Midge Miniumarren Kasha Howeth, MD  10/05/2016, 10:40 AM

## 2016-10-05 NOTE — Anesthesia Postprocedure Evaluation (Signed)
Anesthesia Post Note  Patient: Brandy CheeksLinda D Johnston  Procedure(s) Performed: Procedure(s) (LRB): ESOPHAGOGASTRODUODENOSCOPY (EGD) WITH PROPOFOL (N/A)  Patient location during evaluation: PACU Anesthesia Type: MAC Level of consciousness: awake and alert Pain management: pain level controlled Vital Signs Assessment: post-procedure vital signs reviewed and stable Respiratory status: spontaneous breathing, nonlabored ventilation, respiratory function stable and patient connected to nasal cannula oxygen Cardiovascular status: stable and blood pressure returned to baseline Anesthetic complications: no    Caress Reffitt

## 2016-10-06 ENCOUNTER — Encounter: Payer: Self-pay | Admitting: Gastroenterology

## 2016-10-06 LAB — BCR-ABL1, CML/ALL, PCR, QUANT

## 2016-10-07 ENCOUNTER — Encounter: Payer: Self-pay | Admitting: Gastroenterology

## 2016-10-08 LAB — COMP PANEL: LEUKEMIA/LYMPHOMA

## 2016-10-20 ENCOUNTER — Other Ambulatory Visit: Payer: Self-pay | Admitting: Family Medicine

## 2016-10-21 ENCOUNTER — Other Ambulatory Visit: Payer: Self-pay | Admitting: Family Medicine

## 2016-10-21 MED ORDER — BUTALBITAL-APAP-CAFFEINE 50-300-40 MG PO CAPS: ORAL_CAPSULE | ORAL | 0 refills | Status: DC

## 2016-11-02 ENCOUNTER — Other Ambulatory Visit: Payer: Self-pay | Admitting: Family Medicine

## 2016-11-02 NOTE — Telephone Encounter (Signed)
Patient requesting refill of Flexeril to [facility].  

## 2016-11-05 ENCOUNTER — Emergency Department
Admission: EM | Admit: 2016-11-05 | Discharge: 2016-11-05 | Disposition: A | Discharge status: Home / Self Care | Payer: BLUE CROSS/BLUE SHIELD | Attending: Emergency Medicine | Admitting: Emergency Medicine

## 2016-11-05 ENCOUNTER — Encounter: Payer: Self-pay | Admitting: Family Medicine

## 2016-11-05 ENCOUNTER — Encounter: Payer: Self-pay | Admitting: Emergency Medicine

## 2016-11-05 DIAGNOSIS — Z7982 Long term (current) use of aspirin: Secondary | ICD-10-CM | POA: Diagnosis not present

## 2016-11-05 DIAGNOSIS — I129 Hypertensive chronic kidney disease with stage 1 through stage 4 chronic kidney disease, or unspecified chronic kidney disease: Secondary | ICD-10-CM | POA: Diagnosis not present

## 2016-11-05 DIAGNOSIS — N183 Chronic kidney disease, stage 3 (moderate): Secondary | ICD-10-CM | POA: Insufficient documentation

## 2016-11-05 DIAGNOSIS — E1122 Type 2 diabetes mellitus with diabetic chronic kidney disease: Secondary | ICD-10-CM | POA: Diagnosis not present

## 2016-11-05 DIAGNOSIS — Z79899 Other long term (current) drug therapy: Secondary | ICD-10-CM | POA: Diagnosis not present

## 2016-11-05 DIAGNOSIS — R112 Nausea with vomiting, unspecified: Secondary | ICD-10-CM | POA: Diagnosis present

## 2016-11-05 DIAGNOSIS — K529 Noninfective gastroenteritis and colitis, unspecified: Secondary | ICD-10-CM | POA: Diagnosis not present

## 2016-11-05 DIAGNOSIS — Z791 Long term (current) use of non-steroidal anti-inflammatories (NSAID): Secondary | ICD-10-CM | POA: Diagnosis not present

## 2016-11-05 LAB — URINALYSIS COMPLETE WITH MICROSCOPIC (ARMC ONLY)
Bilirubin Urine: NEGATIVE
Glucose, UA: NEGATIVE mg/dL
Hgb urine dipstick: NEGATIVE
Ketones, ur: NEGATIVE mg/dL
Leukocytes, UA: NEGATIVE
Nitrite: NEGATIVE
Protein, ur: 30 mg/dL — AB
Specific Gravity, Urine: 1.011 (ref 1.005–1.030)
pH: 5 (ref 5.0–8.0)

## 2016-11-05 LAB — COMPREHENSIVE METABOLIC PANEL
ALT: 19 U/L (ref 14–54)
AST: 20 U/L (ref 15–41)
Albumin: 4.4 g/dL (ref 3.5–5.0)
Alkaline Phosphatase: 133 U/L — ABNORMAL HIGH (ref 38–126)
Anion gap: 7 (ref 5–15)
BUN: 12 mg/dL (ref 6–20)
CO2: 26 mmol/L (ref 22–32)
Calcium: 9.5 mg/dL (ref 8.9–10.3)
Chloride: 107 mmol/L (ref 101–111)
Creatinine, Ser: 0.98 mg/dL (ref 0.44–1.00)
GFR calc Af Amer: >60 mL/min (ref >60)
GFR calc non Af Amer: >60 mL/min (ref >60)
Glucose, Bld: 97 mg/dL (ref 65–99)
Potassium: 3.7 mmol/L (ref 3.5–5.1)
Sodium: 140 mmol/L (ref 135–145)
Total Bilirubin: 0.2 mg/dL — ABNORMAL LOW (ref 0.3–1.2)
Total Protein: 8.8 g/dL — ABNORMAL HIGH (ref 6.5–8.1)

## 2016-11-05 LAB — CBC
HCT: 37.7 % (ref 35.0–47.0)
Hemoglobin: 12.3 g/dL (ref 12.0–16.0)
MCH: 26.6 pg (ref 26.0–34.0)
MCHC: 32.6 g/dL (ref 32.0–36.0)
MCV: 81.4 fL (ref 80.0–100.0)
Platelets: 231 10*3/uL (ref 150–440)
RBC: 4.63 MIL/uL (ref 3.80–5.20)
RDW: 15.6 % — ABNORMAL HIGH (ref 11.5–14.5)
WBC: 11.1 10*3/uL — ABNORMAL HIGH (ref 3.6–11.0)

## 2016-11-05 LAB — LIPASE, BLOOD: Lipase: 21 U/L (ref 11–51)

## 2016-11-05 MED ORDER — SODIUM CHLORIDE 0.9 % IV BOLUS (SEPSIS)
1000.0000 mL | Freq: Once | INTRAVENOUS | Status: AC
  Administered 2016-11-05: 1000 mL via INTRAVENOUS

## 2016-11-05 MED ORDER — ONDANSETRON HCL 4 MG/2ML IJ SOLN
4.0000 mg | Freq: Once | INTRAMUSCULAR | Status: AC
  Administered 2016-11-05: 4 mg via INTRAVENOUS
  Filled 2016-11-05: qty 2

## 2016-11-05 MED ORDER — ONDANSETRON HCL 4 MG PO TABS: 4.0000 mg | ORAL_TABLET | Freq: Three times a day (TID) | ORAL | 0 refills | Status: DC | PRN

## 2016-11-05 NOTE — ED Notes (Signed)
Pt provided sprite for PO challenge.

## 2016-11-05 NOTE — ED Provider Notes (Signed)
[facility] Emergency Department Provider Note  ____________________________________________   I have reviewed the triage vital signs and the nursing notes.   HISTORY  Chief Complaint Emesis; Abdominal Pain; and Diarrhea    HPI Brandy Johnston is a 55 y.o. female with chronic renal impairment diabetes mellitus reflux disease presents today complaining of nausea vomiting diarrhea. She denies any abdominal pain but she is in the act of having a bowel movement. She denies he melena bright red blood per rectum or hematemesis. She had multiple options of vomiting and multiple episodes of watery diarrhea starting last night. Positive sick contacts. No recent travel no recent antibiotics no fever no chills. Denies any focal abdominal discomfort. At this time she has no pain of any variety. She states that she has been able to take a little sip of water prior to coming in but she is concerned about the ongoing diarrhea.    Past Medical History:  Diagnosis Date  . Anemia   . Chronic renal impairment, stage 3 (moderate)   . Diabetes mellitus without complication (HCC)    No longer on meds  . GERD (gastroesophageal reflux disease)   . Hyperlipidemia   . Hypertension   . Hypokalemia   . Insomnia   . Low calcium levels   . Osteoarthrosis, hip    right hip  . Paresthesia   . Sickle cell anemia (HCC)   . Sickle-cell trait (HCC)   . Tension headache    1x/mo  . Wears contact lenses     Patient Active Problem List   Diagnosis Date Noted  . Early satiety   . Acute gastritis without hemorrhage   . Leukocytosis 09/30/2016  . Atherosclerosis of abdominal aorta (HCC) 08/18/2016  . Well controlled type 2 diabetes mellitus with gastroparesis (HCC) 12/16/2015  . Diabetic neuropathy associated with type 2 diabetes mellitus (HCC) 09/18/2015  . Insomnia 09/18/2015  . Benign essential HTN 06/18/2015  . Chronic kidney disease (CKD), stage III (moderate) 06/18/2015  .  Diabetes mellitus with renal manifestation (HCC) 06/18/2015  . Abnormal serum level of alkaline phosphatase 06/18/2015  . Neuritis or radiculitis due to rupture of lumbar intervertebral disc 06/18/2015  . Obesity (BMI 30.0-34.9) 06/18/2015  . Degenerative arthritis of hip 06/18/2015  . Sickle cell trait (HCC) 06/18/2015  . Dyslipidemia 05/27/2010  . Gastro-esophageal reflux disease without esophagitis 05/25/2008    Past Surgical History:  Procedure Laterality Date  . CHOLECYSTECTOMY    . COLONOSCOPY    . ESOPHAGOGASTRODUODENOSCOPY (EGD) WITH PROPOFOL N/A 10/05/2016   Procedure: ESOPHAGOGASTRODUODENOSCOPY (EGD) WITH PROPOFOL;  Surgeon: Midge Miniumarren Wohl, MD;  [facility];  Service: Endoscopy;  Laterality: N/A;  . FRACTURE SURGERY Left    cast and pins   . TUBAL LIGATION      Prior to Admission medications   Medication Sig Start Date End Date Taking? Authorizing Provider  amLODipine (NORVASC) 10 MG tablet Take 10 mg by mouth daily.    Historical Provider, MD  aspirin 81 MG chewable tablet Chew 1 tablet by mouth as needed. 05/27/10   Historical Provider, MD  Butalbital-APAP-Caffeine 50-300-40 MG CAPS take 1 capsule by mouth every 6 hours if needed 10/21/16   Alba CoryKrichna Sowles, MD  cyclobenzaprine (FLEXERIL) 5 MG tablet take 1 tablet by mouth every 8 hours if needed 11/02/16   Alba CoryKrichna Sowles, MD  gabapentin (NEURONTIN) 300 MG capsule Take 1 capsule (300 mg total) by mouth 2 (two) times daily. 07/23/16   Alba CoryKrichna Sowles, MD  magnesium oxide (  MAG-OX) 400 MG tablet Take 1 tablet by mouth 2 (two) times daily. 11/05/14   Historical Provider, MD  meloxicam (MOBIC) 7.5 MG tablet Take 1 tablet (7.5 mg total) by mouth daily. 07/23/16   Alba CoryKrichna Sowles, MD  ondansetron (ZOFRAN) 4 MG tablet Take 1 tablet (4 mg total) by mouth every 8 (eight) hours as needed. 08/17/16   Phineas SemenGraydon Goodman, MD  temazepam (RESTORIL) 15 MG capsule Take 1 capsule (15 mg total) by mouth as needed. 07/23/16   Alba CoryKrichna Sowles, MD   traMADol (ULTRAM) 50 MG tablet Take 1 tablet (50 mg total) by mouth every 12 (twelve) hours as needed. For pain 07/23/16   Alba CoryKrichna Sowles, MD  Vitamin D, Ergocalciferol, (DRISDOL) 50000 units CAPS capsule Take 1 capsule (50,000 Units total) by mouth every 7 (seven) days. 09/16/16   Alba CoryKrichna Sowles, MD    Allergies Patient has no known allergies.  Family History  Problem Relation Age of Onset  . Migraines Mother   . Diabetes Mother   . Cancer Mother     lung  . Arthritis Brother   . Breast cancer Maternal Aunt   . Cancer Maternal Uncle     Lung and Colon  . Cirrhosis Brother   . Breast cancer Cousin     Social History Social History  Substance Use Topics  . Smoking status: Never Smoker  . Smokeless tobacco: Never Used  . Alcohol use 0.0 oz/week     Comment: rare    Review of Systems Constitutional: No fever/chills Eyes: No visual changes. ENT: No sore throat. No stiff neck no neck pain Cardiovascular: Denies chest pain. Respiratory: Denies shortness of breath. Gastrointestinal:   See history of present illness Genitourinary: Negative for dysuria. Musculoskeletal: Negative lower extremity swelling Skin: Negative for rash. Neurological: Negative for severe headaches, focal weakness or numbness. 10-point ROS otherwise negative.  ____________________________________________   PHYSICAL EXAM:  VITAL SIGNS: ED Triage Vitals  Enc Vitals Group     BP 11/05/16 1123 126/74     Pulse Rate 11/05/16 1123 74     Resp 11/05/16 1452 18     Temp 11/05/16 1123 98.4 F (36.9 C)     Temp Source 11/05/16 1123 Oral     SpO2 11/05/16 1123 98 %     Weight 11/05/16 1123 175 lb (79.4 kg)     Height 11/05/16 1123 5' 3.5" (1.613 m)     Head Circumference --      Peak Flow --      Pain Score 11/05/16 1124 6     Pain Loc --      Pain Edu? --      Excl. in GC? --     Constitutional: Alert and oriented. Well appearing and in no acute distress. Eyes: Conjunctivae are normal. PERRL.  EOMI. Head: Atraumatic. Nose: No congestion/rhinnorhea. Mouth/Throat: Mucous membranes are moist.  Oropharynx non-erythematous. Neck: No stridor.   Nontender with no meningismus Cardiovascular: Normal rate, regular rhythm. Grossly normal heart sounds.  Good peripheral circulation. Respiratory: Normal respiratory effort.  No retractions. Lungs CTAB. Abdominal: Soft and nontender. No distention. No guarding no rebound Back:  There is no focal tenderness or step off.  there is no midline tenderness there are no lesions noted. there is no CVA tenderness Musculoskeletal: No lower extremity tenderness, no upper extremity tenderness. No joint effusions, no DVT signs strong distal pulses no edema Neurologic:  Normal speech and language. No gross focal neurologic deficits are appreciated.  Skin:  Skin is warm, dry  and intact. No rash noted. Psychiatric: Mood and affect are normal. Speech and behavior are normal.  ____________________________________________   LABS (all labs ordered are listed, but only abnormal results are displayed)  Labs Reviewed  URINALYSIS COMPLETEWITH MICROSCOPIC ([facility] ONLY) - Abnormal; Notable for the following:       Result Value   Color, Urine YELLOW (*)    APPearance CLEAR (*)    Protein, ur 30 (*)    Bacteria, UA FEW (*)    Squamous Epithelial / LPF 0-5 (*)    All other components within normal limits  CBC - Abnormal; Notable for the following:    WBC 11.1 (*)    RDW 15.6 (*)    All other components within normal limits  COMPREHENSIVE METABOLIC PANEL - Abnormal; Notable for the following:    Total Protein 8.8 (*)    Alkaline Phosphatase 133 (*)    Total Bilirubin 0.2 (*)    All other components within normal limits  LIPASE, BLOOD   ____________________________________________  EKG  I personally interpreted any EKGs ordered by me or triage  ____________________________________________  RADIOLOGY  I reviewed any imaging ordered by me or triage that  were performed during my shift and, if possible, patient and/or family made aware of any abnormal findings. ____________________________________________   PROCEDURES  Procedure(s) performed: None  Procedures  Critical Care performed: None  ____________________________________________   INITIAL IMPRESSION / ASSESSMENT AND PLAN / ED COURSE  Pertinent labs & imaging results that were available during my care of the patient were reviewed by me and considered in my medical decision making (see chart for details).  Very well-appearing woman with nausea vomiting diarrhea mostly viral etiology blood work is reassuring kidney function socially but at baseline. Alkaline phosphatase is slightly elevated and that made patient aware of this however this is consistent with an accidental that multiple prior test, she has no focal right upper quadrant tenderness nothing at this time to suggest this is an acute gallbladder disease process. Patient has watery diarrhea as her primary complaint today. She has had no recent travel nothing to suggest that she likely has Clostridium difficile, and there is no indication at this time for antibiotics. I have encouraged probiotics. We did give the patient IV fluid, antiemetics she feels 100% better she would like to try by mouth prior to discharge which we will do. Extensive return precautions and follow-up given and understood. Patient notes she must follow-up with her primary care doctor to discuss her borderline elevation of alkaline phosphatase over the last year  Clinical Course    ____________________________________________   FINAL CLINICAL IMPRESSION(S) / ED DIAGNOSES  Final diagnoses:  None      This chart was dictated using voice recognition software.  Despite best efforts to proofread,  errors can occur which can change meaning.      Jeanmarie Plant, MD 11/05/16 [phone]

## 2016-11-05 NOTE — ED Notes (Signed)
Pt tolerating sprite well.

## 2016-11-05 NOTE — ED Triage Notes (Signed)
Pt presents with reports of NVD and lower abdominal pain that began yesterday.

## 2016-11-05 NOTE — Discharge Instructions (Signed)
Return to the emergency room for any new or worrisome symptoms including bleeding, black stool, persistent vomiting, dehydration, abdominal pain, fever or if you feel worse in any way. He denies the alkaline phosphatase is slightly elevated, this is a baseline elevated lab for you, and does require you to talk to primary care doctor please about further evaluation as an outpatient. Drink plenty of fluids, take probiotics, using anti-nausea medication as needed and be sure to come back if you feel worse

## 2016-12-08 ENCOUNTER — Other Ambulatory Visit: Payer: Self-pay | Admitting: Family Medicine

## 2016-12-09 NOTE — Telephone Encounter (Addendum)
[facility] GI Specialist stated Dr. Carlynn PurlSowles needs to take over this medication from here on out. Please refill medication, patient really needs this.

## 2016-12-15 ENCOUNTER — Other Ambulatory Visit: Payer: Self-pay | Admitting: Family Medicine

## 2016-12-15 DIAGNOSIS — M25512 Pain in left shoulder: Secondary | ICD-10-CM

## 2016-12-15 DIAGNOSIS — M5116 Intervertebral disc disorders with radiculopathy, lumbar region: Secondary | ICD-10-CM

## 2016-12-15 DIAGNOSIS — M25511 Pain in right shoulder: Secondary | ICD-10-CM

## 2016-12-15 NOTE — Telephone Encounter (Signed)
Patient requesting refill of Butalbital-APA and [facility]adol [facility].

## 2016-12-18 ENCOUNTER — Encounter: Payer: Self-pay | Admitting: Family Medicine

## 2016-12-22 ENCOUNTER — Telehealth: Payer: Self-pay | Admitting: Family Medicine

## 2016-12-22 NOTE — Telephone Encounter (Signed)
Patient states during her last visit-08/19/16 Dr. Carlynn PurlSowles increased her omeprazole to twice a day. But patient has always been on medication once a day, and she did go to GI. They found out thru her scope she had a infection in her intestines causing all the pain. Patient wanted to ask for a explanation on why her omeprazole was increased before start changing her regimen. Thanks

## 2016-12-22 NOTE — Telephone Encounter (Signed)
Pt has questions about her Omeprazole. Please return her call.

## 2016-12-26 NOTE — Telephone Encounter (Signed)
She can stay on one a day, I had increased dose to see if her symptoms would improve, but since she went to GI she can continue previous regiment

## 2016-12-29 ENCOUNTER — Other Ambulatory Visit: Payer: Self-pay | Admitting: Family Medicine

## 2017-01-04 ENCOUNTER — Other Ambulatory Visit: Payer: BLUE CROSS/BLUE SHIELD

## 2017-01-04 ENCOUNTER — Ambulatory Visit: Payer: BLUE CROSS/BLUE SHIELD | Admitting: Oncology

## 2017-01-11 ENCOUNTER — Other Ambulatory Visit: Payer: Self-pay | Admitting: Family Medicine

## 2017-01-12 ENCOUNTER — Other Ambulatory Visit: Payer: Self-pay

## 2017-01-12 DIAGNOSIS — M25511 Pain in right shoulder: Secondary | ICD-10-CM

## 2017-01-12 DIAGNOSIS — M5116 Intervertebral disc disorders with radiculopathy, lumbar region: Secondary | ICD-10-CM

## 2017-01-12 DIAGNOSIS — M25512 Pain in left shoulder: Secondary | ICD-10-CM

## 2017-01-12 MED ORDER — MELOXICAM 7.5 MG PO TABS: 7.5000 mg | ORAL_TABLET | Freq: Every day | ORAL | 2 refills | Status: DC

## 2017-01-12 NOTE — Telephone Encounter (Signed)
[facility]

## 2017-01-12 NOTE — Telephone Encounter (Signed)
Patient called and states her appointment is next Wednesday 01/20/17 and is trying to get in sooner. But due to the upcoming weather is not promised she could come in before hand. Patient is completely out of 3 of her medications and wanted to know if you could send in some more until her appointment. Patient states she is out of her pain medication and migraine medicine. Please advise.

## 2017-01-12 NOTE — Telephone Encounter (Signed)
Patient notified

## 2017-01-20 ENCOUNTER — Ambulatory Visit (INDEPENDENT_AMBULATORY_CARE_PROVIDER_SITE_OTHER): Payer: BLUE CROSS/BLUE SHIELD | Admitting: Family Medicine

## 2017-01-20 VITALS — BP 138/78 | HR 92 | Temp 98.9°F | Resp 16 | Ht 64.0 in | Wt 189.2 lb

## 2017-01-20 DIAGNOSIS — G4709 Other insomnia: Secondary | ICD-10-CM

## 2017-01-20 DIAGNOSIS — E1143 Type 2 diabetes mellitus with diabetic autonomic (poly)neuropathy: Secondary | ICD-10-CM | POA: Diagnosis not present

## 2017-01-20 DIAGNOSIS — N183 Chronic kidney disease, stage 3 unspecified: Secondary | ICD-10-CM

## 2017-01-20 DIAGNOSIS — E1122 Type 2 diabetes mellitus with diabetic chronic kidney disease: Secondary | ICD-10-CM

## 2017-01-20 DIAGNOSIS — E1142 Type 2 diabetes mellitus with diabetic polyneuropathy: Secondary | ICD-10-CM

## 2017-01-20 DIAGNOSIS — E785 Hyperlipidemia, unspecified: Secondary | ICD-10-CM

## 2017-01-20 DIAGNOSIS — I10 Essential (primary) hypertension: Secondary | ICD-10-CM

## 2017-01-20 DIAGNOSIS — K219 Gastro-esophageal reflux disease without esophagitis: Secondary | ICD-10-CM

## 2017-01-20 DIAGNOSIS — G8929 Other chronic pain: Secondary | ICD-10-CM

## 2017-01-20 DIAGNOSIS — M5442 Lumbago with sciatica, left side: Secondary | ICD-10-CM | POA: Diagnosis not present

## 2017-01-20 DIAGNOSIS — Z23 Encounter for immunization: Secondary | ICD-10-CM | POA: Diagnosis not present

## 2017-01-20 DIAGNOSIS — D72829 Elevated white blood cell count, unspecified: Secondary | ICD-10-CM | POA: Diagnosis not present

## 2017-01-20 DIAGNOSIS — R790 Abnormal level of blood mineral: Secondary | ICD-10-CM | POA: Diagnosis not present

## 2017-01-20 LAB — CBC WITH DIFFERENTIAL/PLATELET
Basophils Absolute: 92 cells/uL (ref 0–200)
Basophils Relative: 1 %
Eosinophils Absolute: 368 cells/uL (ref 15–500)
Eosinophils Relative: 4 %
HCT: 37.9 % (ref 35.0–45.0)
Hemoglobin: 12.3 g/dL (ref 11.7–15.5)
Lymphocytes Relative: 30 %
Lymphs Abs: 2760 cells/uL (ref 850–3900)
MCH: 26.6 pg — ABNORMAL LOW (ref 27.0–33.0)
MCHC: 32.5 g/dL (ref 32.0–36.0)
MCV: 82 fL (ref 80.0–100.0)
MPV: 11.2 fL (ref 7.5–12.5)
Monocytes Absolute: 460 cells/uL (ref 200–950)
Monocytes Relative: 5 %
Neutro Abs: 5520 cells/uL (ref 1500–7800)
Neutrophils Relative %: 60 %
Platelets: 244 10*3/uL (ref 140–400)
RBC: 4.62 MIL/uL (ref 3.80–5.10)
RDW: 15.1 % — ABNORMAL HIGH (ref 11.0–15.0)
WBC: 9.2 10*3/uL (ref 3.8–10.8)

## 2017-01-20 LAB — COMPLETE METABOLIC PANEL WITH GFR
ALT: 12 U/L (ref 6–29)
AST: 20 U/L (ref 10–35)
Albumin: 4 g/dL (ref 3.6–5.1)
Alkaline Phosphatase: 128 U/L (ref 33–130)
BUN: 14 mg/dL (ref 7–25)
CO2: 30 mmol/L (ref 20–31)
Calcium: 9.2 mg/dL (ref 8.6–10.4)
Chloride: 106 mmol/L (ref 98–110)
Creat: 1.14 mg/dL — ABNORMAL HIGH (ref 0.50–1.05)
GFR, Est African American: 62 mL/min (ref >=60)
GFR, Est Non African American: 54 mL/min — ABNORMAL LOW (ref >=60)
Glucose, Bld: 113 mg/dL — ABNORMAL HIGH (ref 65–99)
Potassium: 3.9 mmol/L (ref 3.5–5.3)
Sodium: 141 mmol/L (ref 135–146)
Total Bilirubin: 0.4 mg/dL (ref 0.2–1.2)
Total Protein: 7.8 g/dL (ref 6.1–8.1)

## 2017-01-20 LAB — LIPID PANEL
Cholesterol: 211 mg/dL — ABNORMAL HIGH (ref <200)
HDL: 55 mg/dL (ref >50)
LDL Cholesterol: 137 mg/dL — ABNORMAL HIGH (ref <100)
Total CHOL/HDL Ratio: 3.8 Ratio (ref <5.0)
Triglycerides: 93 mg/dL (ref <150)
VLDL: 19 mg/dL (ref <30)

## 2017-01-20 LAB — MAGNESIUM: Magnesium: 1.7 mg/dL (ref 1.5–2.5)

## 2017-01-20 MED ORDER — TEMAZEPAM 15 MG PO CAPS: 15.0000 mg | ORAL_CAPSULE | ORAL | 2 refills | Status: DC | PRN

## 2017-01-20 MED ORDER — GABAPENTIN 300 MG PO CAPS: 300.0000 mg | ORAL_CAPSULE | Freq: Two times a day (BID) | ORAL | 1 refills | Status: DC

## 2017-01-20 MED ORDER — OMEPRAZOLE 20 MG PO CPDR: 20.0000 mg | DELAYED_RELEASE_CAPSULE | Freq: Every day | ORAL | 1 refills | Status: DC

## 2017-01-20 MED ORDER — BUTALBITAL-APAP-CAFFEINE 50-300-40 MG PO CAPS: ORAL_CAPSULE | ORAL | 0 refills | Status: DC

## 2017-01-20 MED ORDER — VITAMIN D (ERGOCALCIFEROL) 1.25 MG (50000 UNIT) PO CAPS: 50000.0000 [IU] | ORAL_CAPSULE | ORAL | 0 refills | Status: DC

## 2017-01-20 MED ORDER — TRAMADOL HCL 50 MG PO TABS: 50.0000 mg | ORAL_TABLET | Freq: Two times a day (BID) | ORAL | 2 refills | Status: DC | PRN

## 2017-01-20 MED ORDER — AMLODIPINE BESYLATE-VALSARTAN 5-160 MG PO TABS: 1.0000 | ORAL_TABLET | Freq: Every day | ORAL | 5 refills | Status: DC

## 2017-01-20 NOTE — Addendum Note (Signed)
Addended by: Alba CorySOWLES, Winfield Caba F on: 01/20/2017 11:31 AM   Modules accepted: Orders

## 2017-01-20 NOTE — Progress Notes (Signed)
Name: Brandy Johnston   MRN: [id number]    DOB: 04/27/1961   Date:01/20/2017       Progress Note  Subjective  Chief Complaint  Chief Complaint  Patient presents with  . Medication Refill  . Pain    lower back pain  . Flu Vaccine    HPI  DMII with renal manifestation, gastroparesis and neuropathy. States now finger tips and toes are better since she has been magnesium and gabapentin. She has not been checking her glucose levels at home.  Denies polyphagia, polydipsia or polyuria. She is off  Metformin,. She has not been compliant with her diet since Thanksgiving and also since she recovered from acute gastritis she has been eating more than usual and has gained the weight she had lost while sick.  Off Reglan, food is staying down now. ARB for kidney protection. She is tolerating Gabapentin usually twice daily, otherwise gets too sleepy   Insomnia: She is taking Temazepam prn, wakes up at 3 am but able to fall back asleep, she is afraid of going up on the dose.  She usually wakes up feeling rested in am. She takes it only prn, but needs refills  HTN: taking medication bp is at goal , no chest pain , seldom has a flutter sensation in her chest, but not recently - no pain or diaphoresis or SOB associated with flutters - symptoms lasts a few seconds and resolves by itself. Taking medication as prescribed  Hyperlipidemia: she stopped Lipitor months ago, not sure why. We will recheck labs  GERD: she is back on Omeprazole once daily because while on Ranitidine symptoms got worse, she is aware of long term risk of PPI use  Left lumbar radiculitis:no recent flares,The left outer thigh and bottom of left foot stays numb. She had NCS and MRI. She does not want to see Dr. Phyllis Ginger at this time or get PT because of cost. She has to move heavy crates at work and it seems to flare her symptoms  Low calcium and magnesium: seen by Endo still taking supplementation, we were supposed to stop Omeprazole  and start Ranitidine, but unable to tolerate the switch.We will recheck labs  Bilateral shoulder pain: symptoms started about 2 year  ago and is getting progressively worse. 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 Pain is described as a soreness on both shoulder, worse on anterior right shoulder. She has seen Ortho and got steroid injections and is on Meloxicam but still has pain with abduction, discussed her going back for follow up.   Migraine: she has episodes seldom, usually temporal, sharp, associated with photophobia, but no phonophobia. No nausea or vomiting. She takes prn Fioricet   Patient Active Problem List   Diagnosis Date Noted  . History of acute gastritis   . Leukocytosis 09/30/2016  . Atherosclerosis of abdominal aorta ([place]) 08/18/2016  . Well controlled type 2 diabetes mellitus with gastroparesis ([place]) 12/16/2015  . Diabetic neuropathy associated with type 2 diabetes mellitus ([place]) 09/18/2015  . Insomnia 09/18/2015  . Benign essential HTN 06/18/2015  . Chronic kidney disease (CKD), stage III (moderate) 06/18/2015  . Diabetes mellitus with renal manifestation ([place]) 06/18/2015  . Abnormal serum level of alkaline phosphatase 06/18/2015  . Bilateral low back pain with left-sided sciatica 06/18/2015  . Obesity (BMI 30.0-34.9) 06/18/2015  . Degenerative arthritis of hip 06/18/2015  . Sickle cell trait ([place]) 06/18/2015  . Dyslipidemia 05/27/2010  . Gastro-esophageal reflux disease without  esophagitis 05/25/2008    Past Surgical History:  Procedure Laterality Date  . CHOLECYSTECTOMY    . COLONOSCOPY    . ESOPHAGOGASTRODUODENOSCOPY (EGD) WITH PROPOFOL N/A 10/05/2016   Procedure: ESOPHAGOGASTRODUODENOSCOPY (EGD) WITH PROPOFOL;  Surgeon: Lucilla Lame, MD;  Location: [facility];  Service: Endoscopy;  Laterality: N/A;  . FRACTURE SURGERY Left    cast and pins   . TUBAL LIGATION      Family History  Problem Relation Age of  Onset  . Migraines Mother   . Diabetes Mother   . Cancer Mother     lung  . Arthritis Brother   . Breast cancer Maternal Aunt   . Cancer Maternal Uncle     Lung and Colon  . Cirrhosis Brother   . Breast cancer Cousin     Social History   Social History  . Marital status: Single    Spouse name: N/A  . Number of children: N/A  . Years of education: N/A   Occupational History  . Not on file.   Social History Main Topics  . Smoking status: Never Smoker  . Smokeless tobacco: Never Used  . Alcohol use 0.0 oz/week     Comment: rare  . Drug use: Yes    Types: Marijuana     Comment: smokes marijuana occasionally  . Sexual activity: Yes    Birth control/ protection: Other-see comments     Comment: homosexual    Other Topics Concern  . Not on file   Social History Narrative  . No narrative on file     Current Outpatient Prescriptions:  .  amLODipine-valsartan (EXFORGE) 5-160 MG tablet, Take 1 tablet by mouth daily., Disp: 30 tablet, Rfl: 5 .  aspirin 81 MG chewable tablet, Chew 1 tablet by mouth as needed., Disp: , Rfl:  .  Butalbital-APAP-Caffeine 50-300-40 MG CAPS, take 1 capsule by mouth every 6 hours if needed, Disp: 40 capsule, Rfl: 0 .  cyclobenzaprine (FLEXERIL) 5 MG tablet, take 1 tablet by mouth every 8 hours if needed, Disp: 90 tablet, Rfl: 2 .  gabapentin (NEURONTIN) 300 MG capsule, Take 1 capsule (300 mg total) by mouth 2 (two) times daily., Disp: 180 capsule, Rfl: 1 .  Magnesium Oxide 400 (240 Mg) MG TABS, take 1 tablet by mouth twice a day, Disp: 60 tablet, Rfl: 1 .  meloxicam (MOBIC) 7.5 MG tablet, Take 1 tablet (7.5 mg total) by mouth daily., Disp: 30 tablet, Rfl: 2 .  omeprazole (PRILOSEC) 20 MG capsule, Take 1 capsule (20 mg total) by mouth daily., Disp: 90 capsule, Rfl: 1 .  temazepam (RESTORIL) 15 MG capsule, Take 1 capsule (15 mg total) by mouth as needed., Disp: 30 capsule, Rfl: 2 .  traMADol (ULTRAM) 50 MG tablet, Take 1 tablet (50 mg total) by mouth  every 12 (twelve) hours as needed. For pain, Disp: 60 tablet, Rfl: 2 .  Vitamin D, Ergocalciferol, (DRISDOL) 50000 units CAPS capsule, Take 1 capsule (50,000 Units total) by mouth every 7 (seven) days., Disp: 12 capsule, Rfl: 0  No Known Allergies   ROS  Constitutional: Negative for fever, positive for  weight change.  Respiratory: Negative for cough and shortness of breath.   Cardiovascular: Negative for chest pain or palpitations.  Gastrointestinal: Negative for abdominal pain, no bowel changes.  Musculoskeletal: Negative for gait problem or joint swelling.  Skin: Negative for rash.  Neurological: Negative for dizziness or headache.  No other specific complaints in a complete review of systems (except as listed in  HPI above).  Objective  Vitals:   01/20/17 1041  BP: 138/78  Pulse: 92  Resp: 16  Temp: 98.9 F (37.2 C)  SpO2: 95%  Weight: 189 lb 3 oz (85.8 kg)  Height: _0  (1.626 m)    Body mass index is 32.47 kg/m.  Physical Exam  Constitutional: Patient appears well-developed and well-nourished. Obese No distress.  HEENT: head atraumatic, normocephalic, pupils equal and reactive to light,  neck supple, throat within normal limits Cardiovascular: Normal rate, regular rhythm and normal heart sounds.  No murmur heard. No BLE edema. Pulmonary/Chest: Effort normal and breath sounds normal. No respiratory distress. Abdominal: Soft.  There is no tenderness. Psychiatric: Patient has a normal mood and affect. behavior is normal. Judgment and thought content normal. Muscular skeletal: pain during palpation of lumbar spine, negative straight leg raise  Recent Results (from the past 2160 hour(s))  Urinalysis complete, with microscopic     Status: Abnormal   Collection Time: 11/05/16 11:26 AM  Result Value Ref Range   Color, Urine YELLOW (A) YELLOW   APPearance CLEAR (A) CLEAR   Glucose, UA NEGATIVE NEGATIVE mg/dL   Bilirubin Urine NEGATIVE NEGATIVE   Ketones, ur NEGATIVE  NEGATIVE mg/dL   Specific Gravity, Urine 1.011 1.005 - 1.030   Hgb urine dipstick NEGATIVE NEGATIVE   pH 5.0 5.0 - 8.0   Protein, ur 30 (A) NEGATIVE mg/dL   Nitrite NEGATIVE NEGATIVE   Leukocytes, UA NEGATIVE NEGATIVE   RBC / HPF 0-5 0 - 5 RBC/hpf   WBC, UA 0-5 0 - 5 WBC/hpf   Bacteria, UA FEW (A) NONE SEEN   Squamous Epithelial / LPF 0-5 (A) NONE SEEN   Mucous PRESENT   CBC     Status: Abnormal   Collection Time: 11/05/16 11:26 AM  Result Value Ref Range   WBC 11.1 (H) 3.6 - 11.0 K/uL   RBC 4.63 3.80 - 5.20 MIL/uL   Hemoglobin 12.3 12.0 - 16.0 g/dL   HCT 37.7 35.0 - 47.0 %   MCV 81.4 80.0 - 100.0 fL   MCH 26.6 26.0 - 34.0 pg   MCHC 32.6 32.0 - 36.0 g/dL   RDW 15.6 (H) 11.5 - 14.5 %   Platelets 231 150 - 440 K/uL  Comprehensive metabolic panel     Status: Abnormal   Collection Time: 11/05/16 11:26 AM  Result Value Ref Range   Sodium 140 135 - 145 mmol/L   Potassium 3.7 3.5 - 5.1 mmol/L   Chloride 107 101 - 111 mmol/L   CO2 26 22 - 32 mmol/L   Glucose, Bld 97 65 - 99 mg/dL   BUN 12 6 - 20 mg/dL   Creatinine, Ser 0.98 0.44 - 1.00 mg/dL   Calcium 9.5 8.9 - 10.3 mg/dL   Total Protein 8.8 (H) 6.5 - 8.1 g/dL   Albumin 4.4 3.5 - 5.0 g/dL   AST 20 15 - 41 U/L   ALT 19 14 - 54 U/L   Alkaline Phosphatase 133 (H) 38 - 126 U/L   Total Bilirubin 0.2 (L) 0.3 - 1.2 mg/dL   GFR calc non Af Amer >60 >60 mL/min   GFR calc Af Amer >60 >60 mL/min    Comment: (NOTE) The eGFR has been calculated using the CKD EPI equation. This calculation has not been validated in all clinical situations. eGFR's persistently <60 mL/min signify possible Chronic Kidney Disease.    Anion gap 7 5 - 15  Lipase, blood     Status: None  Collection Time: 11/05/16 11:26 AM  Result Value Ref Range   Lipase 21 11 - 51 U/L    Diabetic Foot Exam: Diabetic Foot Exam - Simple   Simple Foot Form Diabetic Foot exam was performed with the following findings:  Yes 01/20/2017 11:19 AM  Visual Inspection No  deformities, no ulcerations, no other skin breakdown bilaterally:  Yes Sensation Testing Intact to touch and monofilament testing bilaterally:  Yes Pulse Check Posterior Tibialis and Dorsalis pulse intact bilaterally:  Yes Comments      PHQ2/9: Depression screen [facility] 2/9 08/19/2016 07/23/2016 05/11/2016 04/21/2016 01/14/2016  Decreased Interest 0 0 0 0 0  Down, Depressed, Hopeless 0 0 0 0 0  PHQ - 2 Score 0 0 0 0 0     Fall Risk: Fall Risk  08/19/2016 07/23/2016 05/11/2016 04/21/2016 01/14/2016  Falls in the past year? _0        Assessment & Plan  1. Benign essential HTN  - amLODipine-valsartan (EXFORGE) 5-160 MG tablet; Take 1 tablet by mouth daily.  Dispense: 30 tablet; Refill: 5 - COMPLETE METABOLIC PANEL WITH GFR  2. Need for influenza vaccination  - Flu Vaccine QUAD 36+ mos PF IM (Fluarix & Fluzone Quad PF)  3. Dyslipidemia  - Lipid panel  4. Leukocytosis, unspecified type  - CBC with Differential/Platelet  5. Chronic kidney disease (CKD), stage III (moderate)  Recheck labs  6. Diabetic polyneuropathy associated with type 2 diabetes mellitus (HCC)  - gabapentin (NEURONTIN) 300 MG capsule; Take 1 capsule (300 mg total) by mouth 2 (two) times daily.  Dispense: 180 capsule; Refill: 1 - Hemoglobin A1c  7. Gastro-esophageal reflux disease without esophagitis  - omeprazole (PRILOSEC) 20 MG capsule; Take 1 capsule (20 mg total) by mouth daily.  Dispense: 90 capsule; Refill: 1  8. Low magnesium levels  - Magnesium  9. Chronic bilateral low back pain with left-sided sciatica  - Vitamin D, Ergocalciferol, (DRISDOL) 50000 units CAPS capsule; Take 1 capsule (50,000 Units total) by mouth every 7 (seven) days.  Dispense: 12 capsule; Refill: 0 - traMADol (ULTRAM) 50 MG tablet; Take 1 tablet (50 mg total) by mouth every 12 (twelve) hours as needed. For pain  Dispense: 60 tablet; Refill: 2 - gabapentin (NEURONTIN) 300 MG capsule; Take 1 capsule (300 mg total) by  mouth 2 (two) times daily.  Dispense: 180 capsule; Refill: 1  10. Well controlled type 2 diabetes mellitus with gastroparesis ([place])   11. Other insomnia  - temazepam (RESTORIL) 15 MG capsule; Take 1 capsule (15 mg total) by mouth as needed.  Dispense: 30 capsule; Refill: 2  12. Controlled type 2 diabetes mellitus with stage 3 chronic kidney disease, without long-term current use of insulin ([place])

## 2017-01-21 LAB — HEMOGLOBIN A1C
Hgb A1c MFr Bld: 6.4 % — ABNORMAL HIGH (ref <5.7)
Mean Plasma Glucose: 137 mg/dL

## 2017-01-31 ENCOUNTER — Other Ambulatory Visit: Payer: Self-pay | Admitting: Family Medicine

## 2017-02-01 NOTE — Telephone Encounter (Signed)
[facility]

## 2017-04-07 ENCOUNTER — Other Ambulatory Visit: Payer: Self-pay | Admitting: Family Medicine

## 2017-04-07 NOTE — Telephone Encounter (Signed)
Patient requesting refill of Magnesium to [facility].

## 2017-04-20 ENCOUNTER — Encounter: Payer: Self-pay | Admitting: Family Medicine

## 2017-04-20 ENCOUNTER — Ambulatory Visit (INDEPENDENT_AMBULATORY_CARE_PROVIDER_SITE_OTHER): Payer: BLUE CROSS/BLUE SHIELD | Admitting: Family Medicine

## 2017-04-20 VITALS — BP 138/70 | HR 104 | Temp 98.2°F | Resp 16 | Ht 64.0 in | Wt 187.2 lb

## 2017-04-20 DIAGNOSIS — E1142 Type 2 diabetes mellitus with diabetic polyneuropathy: Secondary | ICD-10-CM | POA: Diagnosis not present

## 2017-04-20 DIAGNOSIS — I10 Essential (primary) hypertension: Secondary | ICD-10-CM | POA: Diagnosis not present

## 2017-04-20 DIAGNOSIS — K219 Gastro-esophageal reflux disease without esophagitis: Secondary | ICD-10-CM

## 2017-04-20 DIAGNOSIS — N183 Chronic kidney disease, stage 3 unspecified: Secondary | ICD-10-CM

## 2017-04-20 DIAGNOSIS — E1122 Type 2 diabetes mellitus with diabetic chronic kidney disease: Secondary | ICD-10-CM | POA: Diagnosis not present

## 2017-04-20 DIAGNOSIS — E785 Hyperlipidemia, unspecified: Secondary | ICD-10-CM

## 2017-04-20 DIAGNOSIS — G8929 Other chronic pain: Secondary | ICD-10-CM

## 2017-04-20 DIAGNOSIS — R002 Palpitations: Secondary | ICD-10-CM

## 2017-04-20 DIAGNOSIS — E559 Vitamin D deficiency, unspecified: Secondary | ICD-10-CM

## 2017-04-20 DIAGNOSIS — M5442 Lumbago with sciatica, left side: Secondary | ICD-10-CM

## 2017-04-20 DIAGNOSIS — G4709 Other insomnia: Secondary | ICD-10-CM | POA: Diagnosis not present

## 2017-04-20 LAB — POCT GLYCOSYLATED HEMOGLOBIN (HGB A1C): Hemoglobin A1C: 6.9

## 2017-04-20 MED ORDER — AMLODIPINE BESYLATE-VALSARTAN 5-160 MG PO TABS: 1.0000 | ORAL_TABLET | Freq: Every day | ORAL | 5 refills | Status: DC

## 2017-04-20 MED ORDER — TEMAZEPAM 15 MG PO CAPS: 15.0000 mg | ORAL_CAPSULE | ORAL | 2 refills | Status: DC | PRN

## 2017-04-20 MED ORDER — OMEPRAZOLE 20 MG PO CPDR: 20.0000 mg | DELAYED_RELEASE_CAPSULE | Freq: Every day | ORAL | 1 refills | Status: DC

## 2017-04-20 MED ORDER — MELOXICAM 7.5 MG PO TABS: 7.5000 mg | ORAL_TABLET | Freq: Every day | ORAL | 2 refills | Status: DC

## 2017-04-20 MED ORDER — ATORVASTATIN CALCIUM 20 MG PO TABS: 20.0000 mg | ORAL_TABLET | Freq: Every day | ORAL | 1 refills | Status: DC

## 2017-04-20 MED ORDER — VITAMIN D 50 MCG (2000 UT) PO CAPS: 1.0000 | ORAL_CAPSULE | Freq: Every day | ORAL | 0 refills | Status: AC

## 2017-04-20 MED ORDER — GABAPENTIN 300 MG PO CAPS: 300.0000 mg | ORAL_CAPSULE | Freq: Two times a day (BID) | ORAL | 1 refills | Status: DC

## 2017-04-20 MED ORDER — TRAMADOL HCL 50 MG PO TABS: 50.0000 mg | ORAL_TABLET | Freq: Two times a day (BID) | ORAL | 2 refills | Status: DC | PRN

## 2017-04-20 NOTE — Progress Notes (Signed)
Name: Brandy Johnston   MRN: [id number]    DOB: 11-13-1961   Date:04/20/2017       Progress Note  Subjective  Chief Complaint  Chief Complaint  Patient presents with  . Diabetes    3 month follow up  . Hypertension  . Back Pain    pt would like a referral to [facility] Dr. Mack Guise whom she's seen for shoulder pain    HPI  DMII with renal manifestation, gastroparesis and neuropathy. States now finger tips and toes are better since she has been magnesium and gabapentin. She has not been checking her glucose levels at home.  Denies polyphagia, polydipsia or polyuria. She is off  Metformin,. She has not been compliant with her diet, drinking Kool Aid at least 24 ounces daily plus three cans of sodas, taking candy to work and oat meal cakes, no protein in her lunch.  Off Reglan, food is staying down now. ARB for kidney protection. She is tolerating Gabapentin usually twice daily, otherwise gets too sleepy   Insomnia: She is taking Temazepam prn, wakes up at 3 am but able to fall back asleep, she is afraid of going up on the dose, she states sometimes she wakes up to void. She usually wakes up feeling rested in am.   HTN: taking medication bp is at goal , no chest pain , seldom has a flutter sensation in her chest, but not recently - no pain or diaphoresis or SOB associated with flutters - symptoms lasts a few seconds and resolves by itself, but seems like it is getting more often and lasting longer, advised to follow up with cardiologistTaking medication as prescribed  Hyperlipidemia: LDL has gone up and she is willing to resume medication  GERD: she is back on Omeprazole once daily because while on Ranitidine symptoms got worse, she is aware of long term risk of PPI use  Left lumbar radiculitis:no recent flares,The left outer thigh and bottom of left foot stays numb. She had NCS and MRI. She does not want to see Dr. Phyllis Ginger at this time or get PT because of cost, discussed home exercise.  She has to move heavy crates at work which does not help with her symptoms.   Low calcium and magnesium: seen by Endo still taking supplementation, we were supposed to stop Omeprazole and start Ranitidine, but unable to tolerate the switch. Last labs back to normal   Bilateral shoulder pain: symptoms started about 2 year ago and is getting progressively worse. 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 Pain is described as a soreness on both shoulder, worse on anterior right shoulder. She has seen Ortho and got steroid injections and is on Meloxicam but still has pain with abduction, discussed her going back for follow up.   Migraine: she has episodes seldom, usually temporal, sharp, associated with photophobia, but no phonophobia. No nausea or vomiting. She takes prn Fioricet  Atherosclerosis Aorta: taking aspirin and will resume statin therapy   Patient Active Problem List   Diagnosis Date Noted  . History of acute gastritis   . Leukocytosis 09/30/2016  . Atherosclerosis of abdominal aorta ([place]) 08/18/2016  . Well controlled type 2 diabetes mellitus with gastroparesis ([place]) 12/16/2015  . Diabetic neuropathy associated with type 2 diabetes mellitus ([place]) 09/18/2015  . Insomnia 09/18/2015  . Benign essential HTN 06/18/2015  . Chronic kidney disease (CKD), stage III (moderate) 06/18/2015  . Diabetes mellitus with renal manifestation ([place]) 06/18/2015  .  Abnormal serum level of alkaline phosphatase 06/18/2015  . Bilateral low back pain with left-sided sciatica 06/18/2015  . Obesity (BMI 30.0-34.9) 06/18/2015  . Degenerative arthritis of hip 06/18/2015  . Sickle cell trait ([place]) 06/18/2015  . Dyslipidemia 05/27/2010  . Gastro-esophageal reflux disease without esophagitis 05/25/2008    Past Surgical History:  Procedure Laterality Date  . CHOLECYSTECTOMY    . COLONOSCOPY    . ESOPHAGOGASTRODUODENOSCOPY (EGD) WITH PROPOFOL N/A 10/05/2016    Procedure: ESOPHAGOGASTRODUODENOSCOPY (EGD) WITH PROPOFOL;  Surgeon: Lucilla Lame, MD;  Location: [facility];  Service: Endoscopy;  Laterality: N/A;  . FRACTURE SURGERY Left    cast and pins   . TUBAL LIGATION      Family History  Problem Relation Age of Onset  . Migraines Mother   . Diabetes Mother   . Cancer Mother     lung  . Arthritis Brother   . Breast cancer Maternal Aunt   . Cancer Maternal Uncle     Lung and Colon  . Cirrhosis Brother   . Breast cancer Cousin     Social History   Social History  . Marital status: Single    Spouse name: N/A  . Number of children: N/A  . Years of education: N/A   Occupational History  . Not on file.   Social History Main Topics  . Smoking status: Never Smoker  . Smokeless tobacco: Never Used  . Alcohol use 0.0 oz/week     Comment: rare  . Drug use: Yes    Types: Marijuana     Comment: smokes marijuana occasionally  . Sexual activity: Yes    Birth control/ protection: Other-see comments     Comment: homosexual    Other Topics Concern  . Not on file   Social History Narrative  . No narrative on file     Current Outpatient Prescriptions:  .  amLODipine-valsartan (EXFORGE) 5-160 MG tablet, Take 1 tablet by mouth daily., Disp: 30 tablet, Rfl: 5 .  aspirin 81 MG chewable tablet, Chew 1 tablet by mouth as needed., Disp: , Rfl:  .  atorvastatin (LIPITOR) 20 MG tablet, Take 1 tablet (20 mg total) by mouth daily., Disp: 90 tablet, Rfl: 1 .  Butalbital-APAP-Caffeine 50-300-40 MG CAPS, take 1 capsule by mouth every 6 hours if needed, Disp: 40 capsule, Rfl: 0 .  Cholecalciferol (VITAMIN D) 2000 units CAPS, Take 1 capsule (2,000 Units total) by mouth daily., Disp: 30 capsule, Rfl: 0 .  cyclobenzaprine (FLEXERIL) 5 MG tablet, take 1 tablet by mouth every 8 hours if needed, Disp: 90 tablet, Rfl: 2 .  gabapentin (NEURONTIN) 300 MG capsule, Take 1 capsule (300 mg total) by mouth 2 (two) times daily., Disp: 180 capsule, Rfl: 1 .   magnesium oxide (MAG-OX) 400 MG tablet, take 1 tablet by mouth twice a day, Disp: 60 tablet, Rfl: 1 .  meloxicam (MOBIC) 7.5 MG tablet, Take 1 tablet (7.5 mg total) by mouth daily., Disp: 30 tablet, Rfl: 2 .  omeprazole (PRILOSEC) 20 MG capsule, Take 1 capsule (20 mg total) by mouth daily., Disp: 90 capsule, Rfl: 1 .  temazepam (RESTORIL) 15 MG capsule, Take 1 capsule (15 mg total) by mouth as needed., Disp: 30 capsule, Rfl: 2 .  traMADol (ULTRAM) 50 MG tablet, Take 1 tablet (50 mg total) by mouth every 12 (twelve) hours as needed. For pain, Disp: 60 tablet, Rfl: 2  No Known Allergies   ROS  Constitutional: Negative for fever or weight change.  Respiratory: Negative  for cough and shortness of breath.   Cardiovascular: Negative for chest pain or palpitations.  Gastrointestinal: Negative for abdominal pain, no bowel changes.  Musculoskeletal: Positive for gait problem or joint swelling.  Skin: Negative for rash.  Neurological: Negative for dizziness, positive for intermittent  headache.  No other specific complaints in a complete review of systems (except as listed in HPI above).  Objective  Vitals:   04/20/17 0930  BP: 138/70  Pulse: (!) 104  Resp: 16  Temp: 98.2 F (36.8 C)  SpO2: 97%  Weight: 187 lb 3 oz (84.9 kg)  Height: 5' 4"  (1.626 m)    Body mass index is 32.13 kg/m.  Physical Exam  Constitutional: Patient appears well-developed and well-nourished. Obese  No distress.  HEENT: head atraumatic, normocephalic, pupils equal and reactive to light,  neck supple, throat within normal limits Cardiovascular: Normal rate, regular rhythm and normal heart sounds.  No murmur heard. No BLE edema. Pulmonary/Chest: Effort normal and breath sounds normal. No respiratory distress. Abdominal: Soft.  There is no tenderness. Psychiatric: Patient has a normal mood and affect. behavior is normal. Judgment and thought content normal. Muscular Skeletal: pain during palpation of lumbar spine  , negative straight leg raise  Recent Results (from the past 2160 hour(s))  Hemoglobin A1c     Status: Abnormal   Collection Time: 01/20/17 11:40 AM  Result Value Ref Range   Hgb A1c MFr Bld 6.4 (H) <5.7 %    Comment:   For someone without known diabetes, a hemoglobin A1c value between 5.7% and 6.4% is consistent with prediabetes and should be confirmed with a follow-up test.   For someone with known diabetes, a value <7% indicates that their diabetes is well controlled. A1c targets should be individualized based on duration of diabetes, age, co-morbid conditions and other considerations.   This assay result is consistent with an increased risk of diabetes.   Currently, no consensus exists regarding use of hemoglobin A1c for diagnosis of diabetes in children.      Mean Plasma Glucose 137 mg/dL  COMPLETE METABOLIC PANEL WITH GFR     Status: Abnormal   Collection Time: 01/20/17 11:40 AM  Result Value Ref Range   Sodium 141 135 - 146 mmol/L   Potassium 3.9 3.5 - 5.3 mmol/L   Chloride 106 98 - 110 mmol/L   CO2 30 20 - 31 mmol/L   Glucose, Bld 113 (H) 65 - 99 mg/dL   BUN 14 7 - 25 mg/dL   Creat 1.14 (H) 0.50 - 1.05 mg/dL    Comment:   For patients > or = 58 years of age: The upper reference limit for Creatinine is approximately 13% higher for people identified as African-American.      Total Bilirubin 0.4 0.2 - 1.2 mg/dL   Alkaline Phosphatase 128 33 - 130 U/L   AST 20 10 - 35 U/L   ALT 12 6 - 29 U/L   Total Protein 7.8 6.1 - 8.1 g/dL   Albumin 4.0 3.6 - 5.1 g/dL   Calcium 9.2 8.6 - 10.4 mg/dL   GFR, Est African American 62 >=60 mL/min   GFR, Est Non African American 54 (L) >=60 mL/min  CBC with Differential/Platelet     Status: Abnormal   Collection Time: 01/20/17 11:40 AM  Result Value Ref Range   WBC 9.2 3.8 - 10.8 K/uL   RBC 4.62 3.80 - 5.10 MIL/uL   Hemoglobin 12.3 11.7 - 15.5 g/dL   HCT 37.9 35.0 -  45.0 %   MCV 82.0 80.0 - 100.0 fL   MCH 26.6 (L) 27.0 - 33.0  pg   MCHC 32.5 32.0 - 36.0 g/dL   RDW 15.1 (H) 11.0 - 15.0 %   Platelets 244 140 - 400 K/uL   MPV 11.2 7.5 - 12.5 fL   Neutro Abs 5,520 1,500 - 7,800 cells/uL   Lymphs Abs 2,760 850 - 3,900 cells/uL   Monocytes Absolute 460 200 - 950 cells/uL   Eosinophils Absolute 368 15 - 500 cells/uL   Basophils Absolute 92 0 - 200 cells/uL   Neutrophils Relative % 60 %   Lymphocytes Relative 30 %   Monocytes Relative 5 %   Eosinophils Relative 4 %   Basophils Relative 1 %   Smear Review Criteria for review not met   Lipid panel     Status: Abnormal   Collection Time: 01/20/17 11:40 AM  Result Value Ref Range   Cholesterol 211 (H) <200 mg/dL   Triglycerides 93 <150 mg/dL   HDL 55 >50 mg/dL   Total CHOL/HDL Ratio 3.8 <5.0 Ratio   VLDL 19 <30 mg/dL   LDL Cholesterol 137 (H) <100 mg/dL  Magnesium     Status: None   Collection Time: 01/20/17 11:40 AM  Result Value Ref Range   Magnesium 1.7 1.5 - 2.5 mg/dL  POCT HgB A1C     Status: Abnormal   Collection Time: 04/20/17  9:47 AM  Result Value Ref Range   Hemoglobin A1C 6.9      PHQ2/9: Depression screen [facility] 2/9 08/19/2016 07/23/2016 05/11/2016 04/21/2016 01/14/2016  Decreased Interest 0 0 0 0 0  Down, Depressed, Hopeless 0 0 0 0 0  PHQ - 2 Score 0 0 0 0 0     Fall Risk: Fall Risk  08/19/2016 07/23/2016 05/11/2016 04/21/2016 01/14/2016  Falls in the past year? No No No No No     Assessment & Plan  1. Type 2 diabetes mellitus with stage 3 chronic kidney disease, without long-term current use of insulin (HCC)  - POCT HgB A1C Discussed importance of resuming Diabetic diet, she has dietician at work, and advised her to discuss her diet with them  2. Benign essential HTN  - amLODipine-valsartan (EXFORGE) 5-160 MG tablet; Take 1 tablet by mouth daily.  Dispense: 30 tablet; Refill: 5  3. Diabetic polyneuropathy associated with type 2 diabetes mellitus (HCC)  - gabapentin (NEURONTIN) 300 MG capsule; Take 1 capsule (300 mg total) by mouth 2  (two) times daily.  Dispense: 180 capsule; Refill: 1  4. Gastro-esophageal reflux disease without esophagitis  - omeprazole (PRILOSEC) 20 MG capsule; Take 1 capsule (20 mg total) by mouth daily.  Dispense: 90 capsule; Refill: 1  5. Chronic bilateral low back pain with left-sided sciatica  - traMADol (ULTRAM) 50 MG tablet; Take 1 tablet (50 mg total) by mouth every 12 (twelve) hours as needed. For pain  Dispense: 60 tablet; Refill: 2 - meloxicam (MOBIC) 7.5 MG tablet; Take 1 tablet (7.5 mg total) by mouth daily.  Dispense: 30 tablet; Refill: 2 - gabapentin (NEURONTIN) 300 MG capsule; Take 1 capsule (300 mg total) by mouth 2 (two) times daily.  Dispense: 180 capsule; Refill: 1  6. Other insomnia  - temazepam (RESTORIL) 15 MG capsule; Take 1 capsule (15 mg total) by mouth as needed.  Dispense: 30 capsule; Refill: 2  7. Dyslipidemia  She is willing to resume medication - atorvastatin (LIPITOR) 20 MG tablet; Take 1 tablet (20 mg total) by mouth  daily.  Dispense: 90 tablet; Refill: 1  8. Vitamin D deficiency  - Cholecalciferol (VITAMIN D) 2000 units CAPS; Take 1 capsule (2,000 Units total) by mouth daily.  Dispense: 30 capsule; Refill: 0  9. Heart palpitations  - Ambulatory referral to Cardiology

## 2017-04-20 NOTE — Patient Instructions (Signed)

## 2017-05-04 ENCOUNTER — Other Ambulatory Visit: Payer: Self-pay | Admitting: Family Medicine

## 2017-05-08 ENCOUNTER — Other Ambulatory Visit: Payer: Self-pay | Admitting: Family Medicine

## 2017-05-15 DIAGNOSIS — R002 Palpitations: Secondary | ICD-10-CM | POA: Insufficient documentation

## 2017-05-15 NOTE — Progress Notes (Signed)
Cardiology Office Note  Date:  05/17/2017   ID:  Brandy Johnston, DOB 01/06/1961, MRN [id number]  PCP:  Alba Cory, MD   Chief Complaint  Patient presents with  . other    Ref by Dr. Carlynn Purl for palpitations. Pt. c/o shortness of breath and fluttering in chest at times. Meds reviewed by the pt. verbally.     HPI:  56 yo woman with h/o DMII with renal manifestation, gastroparesis and neuropathy. , HBA1C 6.4 Insomnia: HTN:  Hyperlipidemia:  GERD: Left lumbar radiculitis: Bilateral shoulder pain:  Migraine: Minimal Atherosclerosis Aorta, mild CAD On CT scan Presents by referral from Dr. Carlynn Purl for consultation of her heart palpitations  She reports symptoms going back at least 2 years Worse recently in the past several weeks Describes having fluttering in her chest, fast rhythm lasting for several minutes at a time Symptoms typically resolve without intervention Denies any chest pain when she has these episodes Unclear if rhythm is regular or irregular no pain or diaphoresis, SOB associated with flutters  Typically presents at rest, during the daytime. Has not noticed any symptoms at night, does not wake up from a sleep with the symptoms  CT scan abdomen reviewed with her in detail There is very mild LAD coronary calcification Minimal if any descending aorta and iliac artery calcification Recently started on Lipitor, tolerating this without side effects  She does not smoke cigarettes, occasional marijuana No heavy alcohol  EKG personally reviewed by myself on todays visit Shows normal sinus rhythm with rate 77 bpm no significant ST or T-wave changes  PMH:   has a past medical history of Anemia; Chronic renal impairment, stage 3 (moderate); Diabetes mellitus without complication (HCC); GERD (gastroesophageal reflux disease); Hyperlipidemia; Hypertension; Hypokalemia; Insomnia; Low calcium levels; Osteoarthrosis, hip; Paresthesia; Sickle cell anemia (HCC); Sickle-cell trait  (HCC); Tension headache; and Wears contact lenses.  PSH:    Past Surgical History:  Procedure Laterality Date  . CHOLECYSTECTOMY    . COLONOSCOPY    . ESOPHAGOGASTRODUODENOSCOPY (EGD) WITH PROPOFOL N/A 10/05/2016   Procedure: ESOPHAGOGASTRODUODENOSCOPY (EGD) WITH PROPOFOL;  Surgeon: Midge Minium, MD;  Location: [facility];  Service: Endoscopy;  Laterality: N/A;  . FRACTURE SURGERY Left    cast and pins   . TUBAL LIGATION      Current Outpatient Prescriptions  Medication Sig Dispense Refill  . amLODipine-valsartan (EXFORGE) 5-160 MG tablet Take 1 tablet by mouth daily. 30 tablet 5  . aspirin 81 MG chewable tablet Chew 1 tablet by mouth as needed.    Marland Kitchen atorvastatin (LIPITOR) 20 MG tablet Take 1 tablet (20 mg total) by mouth daily. 90 tablet 1  . Butalbital-APAP-Caffeine 50-300-40 MG CAPS take 1 capsule by mouth every 6 hours if needed 40 capsule 0  . Cholecalciferol (VITAMIN D) 2000 units CAPS Take 1 capsule (2,000 Units total) by mouth daily. 30 capsule 0  . cyclobenzaprine (FLEXERIL) 5 MG tablet take 1 tablet by mouth every 8 hours if needed 90 tablet 2  . gabapentin (NEURONTIN) 300 MG capsule Take 1 capsule (300 mg total) by mouth 2 (two) times daily. 180 capsule 1  . magnesium oxide (MAG-OX) 400 MG tablet take 1 tablet by mouth twice a day 60 tablet 1  . meloxicam (MOBIC) 7.5 MG tablet Take 1 tablet (7.5 mg total) by mouth daily. 30 tablet 2  . omeprazole (PRILOSEC) 20 MG capsule Take 1 capsule (20 mg total) by mouth daily. 90 capsule 1  . temazepam (RESTORIL) 15 MG capsule Take 1  capsule (15 mg total) by mouth as needed. 30 capsule 2  . traMADol (ULTRAM) 50 MG tablet Take 1 tablet (50 mg total) by mouth every 12 (twelve) hours as needed. For pain 60 tablet 2  . metoprolol tartrate (LOPRESSOR) 25 MG tablet Take 1 tablet (25 mg total) by mouth 2 (two) times daily as needed. 60 tablet 6   No current facility-administered medications for this visit.      Allergies:   Patient  has no known allergies.   Social History:  The patient  reports that she has never smoked. She has never used smokeless tobacco. She reports that she drinks alcohol. She reports that she uses drugs, including Marijuana.   Family History:   family history includes Arthritis in her brother; Breast cancer in her cousin and maternal aunt; Cancer in her maternal uncle and mother; Cirrhosis in her brother; Diabetes in her mother; Migraines in her mother.    Review of Systems: Review of Systems  Constitutional: Negative.   Respiratory: Negative.   Cardiovascular: Positive for palpitations.  Gastrointestinal: Negative.   Musculoskeletal: Negative.   Neurological: Negative.   Psychiatric/Behavioral: Negative.   All other systems reviewed and are negative.    PHYSICAL EXAM: VS:  BP 120/72 (BP Location: Right Arm, Patient Position: Sitting, Cuff Size: Normal)   Pulse 77   Ht 5\' 3"  (1.6 m)   Wt 187 lb (84.8 kg)   BMI 33.13 kg/m  , BMI Body mass index is 33.13 kg/m. GEN: Well nourished, well developed, in no acute distress  HEENT: normal  Neck: no JVD, carotid bruits, or masses Cardiac: RRR; no murmurs, rubs, or gallops,no edema  Respiratory:  clear to auscultation bilaterally, normal work of breathing GI: soft, nontender, nondistended, + BS MS: no deformity or atrophy  Skin: warm and dry, no rash Neuro:  Strength and sensation are intact Psych: euthymic mood, full affect    Recent Labs: 01/20/2017: ALT 12; BUN 14; Creat 1.14; Hemoglobin 12.3; Magnesium 1.7; Platelets 244; Potassium 3.9; Sodium 141    Lipid Panel Lab Results  Component Value Date   CHOL 211 (H) 01/20/2017   HDL 55 01/20/2017   LDLCALC 137 (H) 01/20/2017   TRIG 93 01/20/2017      Wt Readings from Last 3 Encounters:  05/17/17 187 lb (84.8 kg)  04/20/17 187 lb 3 oz (84.9 kg)  01/20/17 189 lb 3 oz (85.8 kg)       ASSESSMENT AND PLAN:  Atherosclerosis of abdominal aorta (HCC) - Plan: EKG 12-Lead, Cardiac  event monitor Minimal plaquing noted in the distal aorta on CT scan  Benign essential HTN - Plan: EKG 12-Lead, Cardiac event monitor Blood pressure is well controlled on today's visit. No changes made to the medications. We'll start metoprolol as needed for rate and rhythm control  Type 2 diabetes mellitus with stage 3 chronic kidney disease, without long-term current use of insulin (HCC) - Plan: EKG 12-Lead, Cardiac event monitor Hemoglobin A1c 6.4 Stressed importance of  low carbohydrate diet  Heart palpitations - Plan: EKG 12-Lead, Cardiac event monitor Etiology unclear, unable to exclude atrial tachycardia or other is arrhythmia such as atrial fibrillation Recommended event monitor to identify her rhythm issues We have given her metoprolol tartrate 25 mg for her to take as needed for palpitations symptoms We will call her with the results of her event monitor  Coronary artery calcification CT scan pulled up in the office today, very mild LAD calcification noted Stressed importance of aggressive diabetes, low  cholesterol, no cigarette smoke  Dyslipidemia Recommend goal LDL less than 70 given coronary calcifications  Disposition:   F/U as needed  Patient was seen in consultation for Dr. Carlynn Purl for issues  detailed and will be referred back to Dr. Carlynn Purl for ongoing care of the medical issues mentioned above    Total encounter time more than 60 minutes  Greater than 50% was spent in counseling and coordination of care with the patient    Orders Placed This Encounter  Procedures  . Cardiac event monitor  . EKG 12-Lead     Signed, Dossie Arbour, M.D., Ph.D. 05/17/2017  [facility] [place], [place] [phone]

## 2017-05-17 ENCOUNTER — Ambulatory Visit (INDEPENDENT_AMBULATORY_CARE_PROVIDER_SITE_OTHER): Payer: BLUE CROSS/BLUE SHIELD | Admitting: Cardiovascular Disease

## 2017-05-17 ENCOUNTER — Encounter: Payer: Self-pay | Admitting: Cardiovascular Disease

## 2017-05-17 VITALS — BP 120/72 | HR 77 | Ht 63.0 in | Wt 187.0 lb

## 2017-05-17 DIAGNOSIS — E785 Hyperlipidemia, unspecified: Secondary | ICD-10-CM

## 2017-05-17 DIAGNOSIS — I700 Atherosclerosis of aorta: Secondary | ICD-10-CM | POA: Diagnosis not present

## 2017-05-17 DIAGNOSIS — N183 Chronic kidney disease, stage 3 unspecified: Secondary | ICD-10-CM

## 2017-05-17 DIAGNOSIS — E1122 Type 2 diabetes mellitus with diabetic chronic kidney disease: Secondary | ICD-10-CM | POA: Diagnosis not present

## 2017-05-17 DIAGNOSIS — I2510 Atherosclerotic heart disease of native coronary artery without angina pectoris: Secondary | ICD-10-CM | POA: Insufficient documentation

## 2017-05-17 DIAGNOSIS — R002 Palpitations: Secondary | ICD-10-CM

## 2017-05-17 DIAGNOSIS — I2584 Coronary atherosclerosis due to calcified coronary lesion: Secondary | ICD-10-CM

## 2017-05-17 DIAGNOSIS — I10 Essential (primary) hypertension: Secondary | ICD-10-CM | POA: Diagnosis not present

## 2017-05-17 MED ORDER — METOPROLOL TARTRATE 25 MG PO TABS: 25.0000 mg | ORAL_TABLET | Freq: Two times a day (BID) | ORAL | 6 refills | Status: DC | PRN

## 2017-05-17 NOTE — Patient Instructions (Addendum)
Medication Instructions:   Please take metoprolol as needed up to twice a day for palpitations  Labwork:  No new labs needed  Testing/Procedures:  Event monitor for palpitations/tachycardia Event monitors are medical devices that record the heart's electrical activity. Doctors most often us these monitors to diagnose arrhythmias. Arrhythmias are problems with the speed or rhythm of the heartbeat. The monitor is a small, portable device. You can wear one while you do your normal daily activities. This is usually used to diagnose what is causing palpitations/syncope (passing out).  You will receive a call from [employer] to verify your address before they mail the monitor to your home - it is very important that you answer this call. Once you receive the monitor, call the 1-800 # on the box. The representative will walk you through application of the monitor and active the monitor.  Follow-Up: We will call you with the event monitor results  If you need a refill on your cardiac medications before your next appointment, please call your pharmacy.     Cardiac Event Monitoring A cardiac event monitor is a small recording device that is used to detect abnormal heart rhythms (arrhythmias). The monitor is used to record your heart rhythm when you have symptoms, such as:  Fast heartbeats (palpitations), such as heart racing or fluttering.  Dizziness.  Fainting or light-headedness.  Unexplained weakness. Some monitors are wired to electrodes placed on your chest. Electrodes are flat, sticky disks that attach to your skin. Other monitors may be hand-held or worn on the wrist. The monitor can be worn for up to 30 days. If the monitor is attached to your chest, a technician will prepare your chest for the electrode placement and show you how to work the monitor. Take time to practice using the monitor before you leave the office. Make sure you understand how to send the information from the  monitor to your health care provider. In some cases, you may need to use a landline telephone instead of a cell phone. What are the risks? Generally, this device is safe to use, but it possible that the skin under the electrodes will become irritated. How to use your cardiac event monitor  Wear your monitor at all times, except when you are in water:  Do not let the monitor get wet.  Take the monitor off when you bathe. Do not swim or use a hot tub with it on.  Keep your skin clean. Do not put body lotion or moisturizer on your chest.  Change the electrodes as told by your health care provider or any time they stop sticking to your skin. You may need to use medical tape to keep them on.  Try to put the electrodes in slightly different places on your chest to help prevent skin irritation. They must remain in the area under your left breast and in the upper right section of your chest.  Make sure the monitor is safely clipped to your clothing or in a location close to your body that your health care provider recommends.  Press the button to record as soon as you feel heart-related symptoms, such as:  Dizziness.  Weakness.  Light-headedness.  Palpitations.  Thumping or pounding in your chest.  Shortness of breath.  Unexplained weakness.  Keep a diary of your activities, such as walking, doing chores, and taking medicine. It is very important to note what you were doing when you pushed the button to record your symptoms. This will help your  health care provider determine what might be contributing to your symptoms.  Send the recorded information as recommended by your health care provider. It may take some time for your health care provider to process the results.  Change the batteries as told by your health care provider.  Keep electronic devices away from your monitor. This includes:  Tablets.  MP3 players.  Cell phones.  While wearing your monitor you should  avoid:  Electric blankets.  Firefighter.  Electric toothbrushes.  Microwave ovens.  Magnets.  Metal detectors. Get help right away if:  You have chest pain.  You have extreme difficulty breathing or shortness of breath.  You develop a very fast heartbeat that persists.  You develop dizziness that does not go away.  You faint or constantly feel like you are about to faint. Summary  A cardiac event monitor is a small recording device that is used to help detect abnormal heart rhythms (arrhythmias).  The monitor is used to record your heart rhythm when you have heart-related symptoms.  Make sure you understand how to send the information from the monitor to your health care provider.  It is important to press the button on the monitor when you have any heart-related symptoms.  Keep a diary of your activities, such as walking, doing chores, and taking medicine. It is very important to note what you were doing when you pushed the button to record your symptoms. This will help your health care provider learn what might be causing your symptoms. This information is not intended to replace advice given to you by your health care provider. Make sure you discuss any questions you have with your health care provider. Document Released: 09/22/2008 Document Revised: 11/28/2016 Document Reviewed: 11/28/2016 Elsevier Interactive Patient Education  2017 [employer].

## 2017-05-21 ENCOUNTER — Encounter: Payer: Self-pay | Admitting: Family Medicine

## 2017-05-21 ENCOUNTER — Encounter (INDEPENDENT_AMBULATORY_CARE_PROVIDER_SITE_OTHER): Payer: BLUE CROSS/BLUE SHIELD

## 2017-05-21 DIAGNOSIS — N183 Chronic kidney disease, stage 3 unspecified: Secondary | ICD-10-CM

## 2017-05-21 DIAGNOSIS — I700 Atherosclerosis of aorta: Secondary | ICD-10-CM | POA: Diagnosis not present

## 2017-05-21 DIAGNOSIS — E1122 Type 2 diabetes mellitus with diabetic chronic kidney disease: Secondary | ICD-10-CM

## 2017-05-21 DIAGNOSIS — R000 Tachycardia, unspecified: Secondary | ICD-10-CM | POA: Diagnosis not present

## 2017-05-21 DIAGNOSIS — R002 Palpitations: Secondary | ICD-10-CM | POA: Diagnosis not present

## 2017-05-21 DIAGNOSIS — I10 Essential (primary) hypertension: Secondary | ICD-10-CM | POA: Diagnosis not present

## 2017-06-08 ENCOUNTER — Other Ambulatory Visit: Payer: Self-pay | Admitting: Family Medicine

## 2017-06-08 NOTE — Telephone Encounter (Signed)
[facility]

## 2017-07-21 ENCOUNTER — Ambulatory Visit: Payer: BLUE CROSS/BLUE SHIELD | Admitting: Family Medicine

## 2017-07-26 ENCOUNTER — Encounter: Payer: Self-pay | Admitting: Family Medicine

## 2017-07-26 ENCOUNTER — Ambulatory Visit (INDEPENDENT_AMBULATORY_CARE_PROVIDER_SITE_OTHER): Payer: BLUE CROSS/BLUE SHIELD | Admitting: Family Medicine

## 2017-07-26 VITALS — BP 118/68 | HR 81 | Temp 98.4°F | Resp 18 | Ht 63.0 in | Wt 184.2 lb

## 2017-07-26 DIAGNOSIS — E1122 Type 2 diabetes mellitus with diabetic chronic kidney disease: Secondary | ICD-10-CM

## 2017-07-26 DIAGNOSIS — N183 Chronic kidney disease, stage 3 unspecified: Secondary | ICD-10-CM

## 2017-07-26 DIAGNOSIS — G4709 Other insomnia: Secondary | ICD-10-CM

## 2017-07-26 DIAGNOSIS — M25511 Pain in right shoulder: Secondary | ICD-10-CM | POA: Insufficient documentation

## 2017-07-26 DIAGNOSIS — E785 Hyperlipidemia, unspecified: Secondary | ICD-10-CM | POA: Diagnosis not present

## 2017-07-26 DIAGNOSIS — M25512 Pain in left shoulder: Secondary | ICD-10-CM

## 2017-07-26 DIAGNOSIS — M5441 Lumbago with sciatica, right side: Secondary | ICD-10-CM | POA: Diagnosis not present

## 2017-07-26 DIAGNOSIS — G8929 Other chronic pain: Secondary | ICD-10-CM | POA: Insufficient documentation

## 2017-07-26 DIAGNOSIS — M5442 Lumbago with sciatica, left side: Secondary | ICD-10-CM

## 2017-07-26 DIAGNOSIS — I700 Atherosclerosis of aorta: Secondary | ICD-10-CM | POA: Diagnosis not present

## 2017-07-26 DIAGNOSIS — E1142 Type 2 diabetes mellitus with diabetic polyneuropathy: Secondary | ICD-10-CM

## 2017-07-26 DIAGNOSIS — L989 Disorder of the skin and subcutaneous tissue, unspecified: Secondary | ICD-10-CM

## 2017-07-26 DIAGNOSIS — M549 Dorsalgia, unspecified: Secondary | ICD-10-CM

## 2017-07-26 DIAGNOSIS — D573 Sickle-cell trait: Secondary | ICD-10-CM

## 2017-07-26 LAB — GLUCOSE, POCT (MANUAL RESULT ENTRY): POC Glucose: 105 mg/dl — AB (ref 70–99)

## 2017-07-26 LAB — POCT GLYCOSYLATED HEMOGLOBIN (HGB A1C): Hemoglobin A1C: 7.3

## 2017-07-26 MED ORDER — MELOXICAM 7.5 MG PO TABS: 7.5000 mg | ORAL_TABLET | Freq: Every day | ORAL | 2 refills | Status: DC

## 2017-07-26 MED ORDER — SEMAGLUTIDE(0.25 OR 0.5MG/DOS) 2 MG/1.5ML ~~LOC~~ SOPN: 0.5000 mg | PEN_INJECTOR | SUBCUTANEOUS | 2 refills | Status: DC

## 2017-07-26 MED ORDER — LIDOCAINE HCL (PF) 1 % IJ SOLN
2.0000 mL | Freq: Once | INTRAMUSCULAR | Status: AC
  Administered 2017-07-26: 2 mL via INTRADERMAL

## 2017-07-26 MED ORDER — TRIAMCINOLONE ACETONIDE 40 MG/ML IJ SUSP: 40.0000 mg | Freq: Once | INTRAMUSCULAR | Status: DC

## 2017-07-26 MED ORDER — TRAMADOL HCL 50 MG PO TABS: 50.0000 mg | ORAL_TABLET | Freq: Two times a day (BID) | ORAL | 2 refills | Status: DC | PRN

## 2017-07-26 MED ORDER — TEMAZEPAM 15 MG PO CAPS: 15.0000 mg | ORAL_CAPSULE | ORAL | 2 refills | Status: DC | PRN

## 2017-07-26 NOTE — Addendum Note (Signed)
Addended by: Cynda FamiliaJOHNSON, Jordanna Hendrie L on: 07/26/2017 03:32 PM   Modules accepted: Orders

## 2017-07-26 NOTE — Addendum Note (Signed)
Addended by: Alba CorySOWLES, Dontrey Snellgrove F on: 07/26/2017 05:10 PM   Modules accepted: Orders

## 2017-07-26 NOTE — Progress Notes (Addendum)
Name: Brandy Johnston   MRN: [id number]    DOB: 1961-05-30   Date:07/26/2017       Progress Note  Subjective  Chief Complaint  Chief Complaint  Patient presents with  . Diabetes    has not been checking due to not having any test strips  . Insomnia  . Hypertension    pt had seen Dr. Mariah Milling for palpitations but stopped taking the mediction stated that she did not notice a difference                        HPI  DMII with renal manifestation, and neuropathy. States now finger tips and toes are better since she has been magnesium and gabapentin. She has not been checking her glucose levels at home. Denies polyphagia, polydipsia or polyuria. She is offMetformin, did not like taking medication. She has not been compliant with her diet, drinking Kool Aid at least 24 ounces daily plus two  cans of sodas, also drinking juice ,taking candy to work and oat meal cakes, no protein in her lunch.  ARB for kidney protection.   Insomnia: She is taking Temazepam prn, wakes up at 3 am but able to fall back asleep,  she states sometimes she wakes up to void. She usually wakes up feeling rested in am.   HTN: taking medication bp is at goal , no chest pain , occasionally has palpitation( seen cardiologist and was given Lopressor but she stopped because it did not improve symptoms)  - no pain or diaphoresis or SOB associated with flutters  Hyperlipidemia: she is back on statin therapy and we will recheck labs on her next visit   GERD: she is back on Omeprazole once daily because while on Ranitidine symptoms got worse, she is aware of long term risk of PPI use  Chronic low back pain with  lumbar radiculitis:no recent flares,The left outer thigh and bottom of left foot stays numb. She had NCS and MRI. She does not want to see Dr. Council Mechanic at this time or get PT because of cost, she has not started home exercises She has a different job position now, and it has helped with intensity of back pain  Low  calcium and magnesium: seen by Endo still taking supplementation, we were supposed to stop Omeprazole and start Ranitidine, but unable to tolerate the switch, she has been taking it daily.   Bilateral shoulder pain: symptoms started about 2 year ago and is getting progressively worse. 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 Pain is described as a soreness on both shoulder, worse on anterior right shoulder. She has seen Ortho and got steroid injections and is on Meloxicam but still has pain with abduction, currently on a different position at work, and has to pick up boxes occasionally but no more than 20 lbs. Feeling better  Migraine: she has episodes seldom, usually temporal, sharp, associated with photophobia, but no phonophobia. No nausea or vomiting. She takes prn Fioricet prn, still has a half bottle at home  Atherosclerosis Aorta: taking aspirin and is back on statin therapy   Skin tags: growing and there is a spot on her neck that is getting irritated, gets caught on her hair  Patient Active Problem List   Diagnosis Date Noted  . Chronic bilateral back pain 07/26/2017  . Bilateral shoulder pain 07/26/2017  . Coronary artery calcification 05/17/2017  . Heart palpitations 05/15/2017  .  History of acute gastritis   . Leukocytosis 09/30/2016  . Atherosclerosis of abdominal aorta (HCC) 08/18/2016  . Type 2 diabetes mellitus with stage 3 chronic kidney disease, without long-term current use of insulin (HCC) 12/16/2015  . Diabetic neuropathy associated with type 2 diabetes mellitus (HCC) 09/18/2015  . Insomnia 09/18/2015  . Benign essential HTN 06/18/2015  . Chronic kidney disease (CKD), stage III (moderate) 06/18/2015  . Diabetes mellitus with renal manifestation (HCC) 06/18/2015  . Abnormal serum level of alkaline phosphatase 06/18/2015  . Bilateral low back pain with left-sided sciatica 06/18/2015  . Obesity (BMI 30.0-34.9) 06/18/2015   . Degenerative arthritis of hip 06/18/2015  . Sickle cell trait (HCC) 06/18/2015  . Dyslipidemia 05/27/2010  . Gastro-esophageal reflux disease without esophagitis 05/25/2008    Past Surgical History:  Procedure Laterality Date  . CHOLECYSTECTOMY    . COLONOSCOPY    . ESOPHAGOGASTRODUODENOSCOPY (EGD) WITH PROPOFOL N/A 10/05/2016   Procedure: ESOPHAGOGASTRODUODENOSCOPY (EGD) WITH PROPOFOL;  Surgeon: Midge Minium, MD;  Location: [facility];  Service: Endoscopy;  Laterality: N/A;  . FRACTURE SURGERY Left    cast and pins   . TUBAL LIGATION      Family History  Problem Relation Age of Onset  . Migraines Mother   . Diabetes Mother   . Cancer Mother        lung  . Arthritis Brother   . Breast cancer Maternal Aunt   . Cancer Maternal Uncle        Lung and Colon  . Cirrhosis Brother   . Breast cancer Cousin     Social History   Social History  . Marital status: Single    Spouse name: N/A  . Number of children: N/A  . Years of education: N/A   Occupational History  . Not on file.   Social History Main Topics  . Smoking status: Never Smoker  . Smokeless tobacco: Never Used  . Alcohol use 0.0 oz/week     Comment: rare  . Drug use: Yes    Types: Marijuana     Comment: smokes marijuana occasionally  . Sexual activity: Yes    Birth control/ protection: Other-see comments     Comment: homosexual    Other Topics Concern  . Not on file   Social History Narrative  . No narrative on file     Current Outpatient Prescriptions:  .  amLODipine-valsartan (EXFORGE) 5-160 MG tablet, Take 1 tablet by mouth daily., Disp: 30 tablet, Rfl: 5 .  aspirin 81 MG chewable tablet, Chew 1 tablet by mouth as needed., Disp: , Rfl:  .  atorvastatin (LIPITOR) 20 MG tablet, Take 1 tablet (20 mg total) by mouth daily., Disp: 90 tablet, Rfl: 1 .  Butalbital-APAP-Caffeine 50-300-40 MG CAPS, take 1 capsule by mouth every 6 hours if needed, Disp: 40 capsule, Rfl: 0 .  Cholecalciferol  (VITAMIN D) 2000 units CAPS, Take 1 capsule (2,000 Units total) by mouth daily., Disp: 30 capsule, Rfl: 0 .  cyclobenzaprine (FLEXERIL) 5 MG tablet, take 1 tablet by mouth every 8 hours if needed, Disp: 90 tablet, Rfl: 2 .  gabapentin (NEURONTIN) 300 MG capsule, Take 1 capsule (300 mg total) by mouth 2 (two) times daily., Disp: 180 capsule, Rfl: 1 .  magnesium oxide (MAG-OX) 400 MG tablet, take 1 tablet by mouth twice a day, Disp: 60 tablet, Rfl: 1 .  meloxicam (MOBIC) 7.5 MG tablet, Take 1 tablet (7.5 mg total) by mouth daily., Disp: 30 tablet, Rfl: 2 .  omeprazole (  PRILOSEC) 20 MG capsule, Take 1 capsule (20 mg total) by mouth daily., Disp: 90 capsule, Rfl: 1 .  temazepam (RESTORIL) 15 MG capsule, Take 1 capsule (15 mg total) by mouth as needed., Disp: 30 capsule, Rfl: 2 .  traMADol (ULTRAM) 50 MG tablet, Take 1 tablet (50 mg total) by mouth every 12 (twelve) hours as needed. For pain, Disp: 60 tablet, Rfl: 2  No Known Allergies   ROS  Constitutional: Negative for fever or weight change.  Respiratory: Negative for cough and shortness of breath.   Cardiovascular: Negative for chest pain or palpitations.  Gastrointestinal: Negative for abdominal pain, no bowel changes.  Musculoskeletal: Negative for gait problem or joint swelling.  Skin: Negative for rash.  Neurological: Negative for dizziness or headache.  No other specific complaints in a complete review of systems (except as listed in HPI above).  Objective  Vitals:   07/26/17 1436  BP: 118/68  Pulse: 81  Resp: 18  Temp: 98.4 F (36.9 C)  SpO2: 95%  Weight: 184 lb 3 oz (83.5 kg)  Height: 5\' 3"  (1.6 m)    Body mass index is 32.63 kg/m.  Physical Exam  Constitutional: Patient appears well-developed and well-nourished. Obese No distress.  HEENT: head atraumatic, normocephalic, pupils equal and reactive to light,  neck supple, throat within normal limits Cardiovascular: Normal rate, regular rhythm and normal heart sounds.   No murmur heard. No BLE edema. Pulmonary/Chest: Effort normal and breath sounds normal. No respiratory distress. Abdominal: Soft.  There is no tenderness. Skin: she states that is a skin tag that keeps getting irritated.  Psychiatric: Patient has a normal mood and affect. behavior is normal. Judgment and thought content normal.  Recent Results (from the past 2160 hour(s))  POCT Glucose (CBG)     Status: Normal   Collection Time: 07/26/17  2:38 PM  Result Value Ref Range   POC Glucose 105 (A) 70 - 99 mg/dl  POCT HgB [id number]     Status: Abnormal   Collection Time: 07/26/17  2:40 PM  Result Value Ref Range   Hemoglobin A1C 7.3      PHQ2/9: Depression screen [facility] 2/9 08/19/2016 07/23/2016 05/11/2016 04/21/2016 01/14/2016  Decreased Interest 0 0 0 0 0  Down, Depressed, Hopeless 0 0 0 0 0  PHQ - 2 Score 0 0 0 0 0    Fall Risk: Fall Risk  08/19/2016 07/23/2016 05/11/2016 04/21/2016 01/14/2016  Falls in the past year? No No No No No     Assessment & Plan  1. Diabetes type II with Chronic kidney disease (CKD), stage III (moderate)  She took Metformin in the past but stopped because of side effects, she denies family history of thyroid cancer, or personal history of pancreatitis, discussed possible side effects - POCT HgB A1C - POCT Glucose (CBG) - Semaglutide (OZEMPIC) 0.25 or 0.5 MG/DOSE SOPN; Inject 0.5 mg into the skin once a week.  Dispense: 2 pen; Refill: 2  2. Sickle cell trait [facility](HCC)  Mother has sickle cell trait, no symptoms   3. Diabetic polyneuropathy associated with type 2 diabetes mellitus (HCC)  Still has pain, taking Gabapentin  4. Atherosclerosis of abdominal aorta (HCC)  On statin and aspirin   5. Chronic bilateral low back pain with bilateral sciatica  - traMADol (ULTRAM) 50 MG tablet; Take 1 tablet (50 mg total) by mouth every 12 (twelve) hours as needed. For pain  Dispense: 60 tablet; Refill: 2 - meloxicam (MOBIC) 7.5 MG tablet; Take 1 tablet (7.5  mg total) by mouth  daily.  Dispense: 30 tablet; Refill: 2  6. Dyslipidemia  On statin therapy   7. Chronic pain of both shoulders  On different job position, secondary to possible overuse injury  - traMADol (ULTRAM) 50 MG tablet; Take 1 tablet (50 mg total) by mouth every 12 (twelve) hours as needed. For pain  Dispense: 60 tablet; Refill: 2 - meloxicam (MOBIC) 7.5 MG tablet; Take 1 tablet (7.5 mg total) by mouth daily.  Dispense: 30 tablet; Refill: 2  8. Other insomnia  - temazepam (RESTORIL) 15 MG capsule; Take 1 capsule (15 mg total) by mouth as needed.  Dispense: 30 capsule; Refill: 2  9. Skin lesions  - Dermatology pathology   Consent signed: YES  Procedure: Skin Mass Removal Location: three on her neck and one in the right inner arm Equipment used:  sterile scalpel, tweezers Anesthesia: 1% Lidocaine w/o Epinephrine  Cleaned and prepped: alcohol swab  Patient is aware that insurance may not cover the cost of procedure  After consent signed, are of skin prepped with betadine. Lidocaine w/o epinephrine injected into skin underneath skin mass. After properly numbed sterile equipment used to remove tag.  Specimen sent for pathology analysis. Instructed on proper care to allow for proper healing. F/U for nursing visit if needed.

## 2017-07-28 DIAGNOSIS — L919 Hypertrophic disorder of the skin, unspecified: Secondary | ICD-10-CM | POA: Diagnosis not present

## 2017-08-14 ENCOUNTER — Other Ambulatory Visit: Payer: Self-pay | Admitting: Family Medicine

## 2017-09-16 ENCOUNTER — Other Ambulatory Visit: Payer: Self-pay | Admitting: Family Medicine

## 2017-09-16 ENCOUNTER — Ambulatory Visit (INDEPENDENT_AMBULATORY_CARE_PROVIDER_SITE_OTHER): Payer: BLUE CROSS/BLUE SHIELD | Admitting: Family Medicine

## 2017-09-16 ENCOUNTER — Encounter: Payer: Self-pay | Admitting: Family Medicine

## 2017-09-16 VITALS — BP 122/68 | HR 77 | Temp 97.9°F | Resp 16 | Ht 63.0 in | Wt 177.0 lb

## 2017-09-16 DIAGNOSIS — Z124 Encounter for screening for malignant neoplasm of cervix: Secondary | ICD-10-CM | POA: Diagnosis not present

## 2017-09-16 DIAGNOSIS — Z23 Encounter for immunization: Secondary | ICD-10-CM

## 2017-09-16 DIAGNOSIS — Z1231 Encounter for screening mammogram for malignant neoplasm of breast: Secondary | ICD-10-CM

## 2017-09-16 DIAGNOSIS — Z113 Encounter for screening for infections with a predominantly sexual mode of transmission: Secondary | ICD-10-CM | POA: Diagnosis not present

## 2017-09-16 DIAGNOSIS — Z1239 Encounter for other screening for malignant neoplasm of breast: Secondary | ICD-10-CM

## 2017-09-16 DIAGNOSIS — Z01419 Encounter for gynecological examination (general) (routine) without abnormal findings: Secondary | ICD-10-CM

## 2017-09-16 DIAGNOSIS — R790 Abnormal level of blood mineral: Secondary | ICD-10-CM | POA: Diagnosis not present

## 2017-09-16 MED ORDER — MAGNESIUM OXIDE 400 MG PO CAPS: 1.0000 | ORAL_CAPSULE | Freq: Two times a day (BID) | ORAL | 5 refills | Status: DC

## 2017-09-16 NOTE — Patient Instructions (Signed)
Preventive Care 40-64 Years, Female Preventive care refers to lifestyle choices and visits with your health care provider that can promote health and wellness. What does preventive care include?  A yearly physical exam. This is also called an annual well check.  Dental exams once or twice a year.  Routine eye exams. Ask your health care provider how often you should have your eyes checked.  Personal lifestyle choices, including: ? Daily care of your teeth and gums. ? Regular physical activity. ? Eating a healthy diet. ? Avoiding tobacco and drug use. ? Limiting alcohol use. ? Practicing safe sex. ? Taking low-dose aspirin daily starting at age 58. ? Taking vitamin and mineral supplements as recommended by your health care provider. What happens during an annual well check? The services and screenings done by your health care provider during your annual well check will depend on your age, overall health, lifestyle risk factors, and family history of disease. Counseling Your health care provider may ask you questions about your:  Alcohol use.  Tobacco use.  Drug use.  Emotional well-being.  Home and relationship well-being.  Sexual activity.  Eating habits.  Work and work Statistician.  Method of birth control.  Menstrual cycle.  Pregnancy history.  Screening You may have the following tests or measurements:  Height, weight, and BMI.  Blood pressure.  Lipid and cholesterol levels. These may be checked every 5 years, or more frequently if you are over 81 years old.  Skin check.  Lung cancer screening. You may have this screening every year starting at age 78 if you have a 30-pack-year history of smoking and currently smoke or have quit within the past 15 years.  Fecal occult blood test (FOBT) of the stool. You may have this test every year starting at age 65.  Flexible sigmoidoscopy or colonoscopy. You may have a sigmoidoscopy every 5 years or a colonoscopy  every 10 years starting at age 30.  Hepatitis C blood test.  Hepatitis B blood test.  Sexually transmitted disease (STD) testing.  Diabetes screening. This is done by checking your blood sugar (glucose) after you have not eaten for a while (fasting). You may have this done every 1-3 years.  Mammogram. This may be done every 1-2 years. Talk to your health care provider about when you should start having regular mammograms. This may depend on whether you have a family history of breast cancer.  BRCA-related cancer screening. This may be done if you have a family history of breast, ovarian, tubal, or peritoneal cancers.  Pelvic exam and Pap test. This may be done every 3 years starting at age 80. Starting at age 36, this may be done every 5 years if you have a Pap test in combination with an HPV test.  Bone density scan. This is done to screen for osteoporosis. You may have this scan if you are at high risk for osteoporosis.  Discuss your test results, treatment options, and if necessary, the need for more tests with your health care provider. Vaccines Your health care provider may recommend certain vaccines, such as:  Influenza vaccine. This is recommended every year.  Tetanus, diphtheria, and acellular pertussis (Tdap, Td) vaccine. You may need a Td booster every 10 years.  Varicella vaccine. You may need this if you have not been vaccinated.  Zoster vaccine. You may need this after age 5.  Measles, mumps, and rubella (MMR) vaccine. You may need at least one dose of MMR if you were born in  1959 or later. You may also need a second dose.  Pneumococcal 13-valent conjugate (PCV13) vaccine. You may need this if you have certain conditions and were not previously vaccinated.  Pneumococcal polysaccharide (PPSV23) vaccine. You may need one or two doses if you smoke cigarettes or if you have certain conditions.  Meningococcal vaccine. You may need this if you have certain  conditions.  Hepatitis A vaccine. You may need this if you have certain conditions or if you travel or work in places where you may be exposed to hepatitis A.  Hepatitis B vaccine. You may need this if you have certain conditions or if you travel or work in places where you may be exposed to hepatitis B.  Haemophilus influenzae type b (Hib) vaccine. You may need this if you have certain conditions.  Talk to your health care provider about which screenings and vaccines you need and how often you need them. This information is not intended to replace advice given to you by your health care provider. Make sure you discuss any questions you have with your health care provider. Document Released: 01/10/2016 Document Revised: 09/02/2016 Document Reviewed: 10/15/2015 Elsevier Interactive Patient Education  2017 [employer].

## 2017-09-16 NOTE — Progress Notes (Signed)
Name: Brandy Johnston   MRN: [id number]    DOB: October 18, 1961   Date:09/16/2017       Progress Note  Subjective  Chief Complaint  Chief Complaint  Patient presents with  . Annual Exam  . Flu Vaccine    HPI  Well woman: she is homosexual, her girlfriend Cora Collum) is here today and states that she has been eating more junk food, even though she is losing weight, appetite is decreased, portion size is smaller. She denies any vaginal discharge, breast problems or change in bowel movements.    Patient Active Problem List   Diagnosis Date Noted  . Chronic bilateral back pain 07/26/2017  . Bilateral shoulder pain 07/26/2017  . Coronary artery calcification 05/17/2017  . Heart palpitations 05/15/2017  . History of acute gastritis   . Leukocytosis 09/30/2016  . Atherosclerosis of abdominal aorta (HCC) 08/18/2016  . Type 2 diabetes mellitus with stage 3 chronic kidney disease, without long-term current use of insulin (HCC) 12/16/2015  . Diabetic neuropathy associated with type 2 diabetes mellitus (HCC) 09/18/2015  . Insomnia 09/18/2015  . Benign essential HTN 06/18/2015  . Chronic kidney disease (CKD), stage III (moderate) 06/18/2015  . Diabetes mellitus with renal manifestation (HCC) 06/18/2015  . Abnormal serum level of alkaline phosphatase 06/18/2015  . Bilateral low back pain with left-sided sciatica 06/18/2015  . Obesity (BMI 30.0-34.9) 06/18/2015  . Degenerative arthritis of hip 06/18/2015  . Sickle cell trait (HCC) 06/18/2015  . Dyslipidemia 05/27/2010  . Gastro-esophageal reflux disease without esophagitis 05/25/2008    Past Surgical History:  Procedure Laterality Date  . CHOLECYSTECTOMY    . COLONOSCOPY    . ESOPHAGOGASTRODUODENOSCOPY (EGD) WITH PROPOFOL N/A 10/05/2016   Procedure: ESOPHAGOGASTRODUODENOSCOPY (EGD) WITH PROPOFOL;  Surgeon: Midge Minium, MD;  Location: [facility];  Service: Endoscopy;  Laterality: N/A;  . FRACTURE SURGERY Left    cast and pins   . TUBAL  LIGATION      Family History  Problem Relation Age of Onset  . Migraines Mother   . Diabetes Mother   . Cancer Mother        lung  . Arthritis Brother   . Breast cancer Maternal Aunt   . Cancer Maternal Uncle        Lung and Colon  . Cirrhosis Brother   . Breast cancer Cousin     Social History   Social History  . Marital status: Single    Spouse name: N/A  . Number of children: N/A  . Years of education: N/A   Occupational History  . Not on file.   Social History Main Topics  . Smoking status: Never Smoker  . Smokeless tobacco: Never Used  . Alcohol use 0.0 oz/week     Comment: rare  . Drug use: Yes    Types: Marijuana     Comment: smokes marijuana occasionally  . Sexual activity: Yes    Birth control/ protection: Other-see comments     Comment: homosexual    Other Topics Concern  . Not on file   Social History Narrative  . No narrative on file     Current Outpatient Prescriptions:  .  amLODipine-valsartan (EXFORGE) 5-160 MG tablet, Take 1 tablet by mouth daily., Disp: 30 tablet, Rfl: 5 .  aspirin 81 MG chewable tablet, Chew 1 tablet by mouth as needed., Disp: , Rfl:  .  atorvastatin (LIPITOR) 20 MG tablet, Take 1 tablet (20 mg total) by mouth daily., Disp: 90 tablet, Rfl: 1 .  Butalbital-APAP-Caffeine 50-300-40 MG CAPS, take 1 capsule by mouth every 6 hours if needed, Disp: 40 capsule, Rfl: 0 .  Cholecalciferol (VITAMIN D) 2000 units CAPS, Take 1 capsule (2,000 Units total) by mouth daily., Disp: 30 capsule, Rfl: 0 .  cyclobenzaprine (FLEXERIL) 5 MG tablet, take 1 tablet by mouth every 8 hours if needed, Disp: 90 tablet, Rfl: 2 .  gabapentin (NEURONTIN) 300 MG capsule, Take 1 capsule (300 mg total) by mouth 2 (two) times daily., Disp: 180 capsule, Rfl: 1 .  meloxicam (MOBIC) 7.5 MG tablet, Take 1 tablet (7.5 mg total) by mouth daily., Disp: 30 tablet, Rfl: 2 .  omeprazole (PRILOSEC) 20 MG capsule, Take 1 capsule (20 mg total) by mouth daily., Disp: 90  capsule, Rfl: 1 .  Semaglutide (OZEMPIC) 0.25 or 0.5 MG/DOSE SOPN, Inject 0.5 mg into the skin once a week., Disp: 2 pen, Rfl: 2 .  temazepam (RESTORIL) 15 MG capsule, Take 1 capsule (15 mg total) by mouth as needed., Disp: 30 capsule, Rfl: 2 .  traMADol (ULTRAM) 50 MG tablet, Take 1 tablet (50 mg total) by mouth every 12 (twelve) hours as needed. For pain, Disp: 60 tablet, Rfl: 2  No Known Allergies   ROS  Constitutional: Negative for fever, positive for weight change.  Respiratory: Negative for cough and shortness of breath.   Cardiovascular: Negative for chest pain or palpitations.  Gastrointestinal: Negative for abdominal pain, no bowel changes.  Musculoskeletal: Negative for gait problem or joint swelling.  Skin: Negative for rash.  Neurological: Negative for dizziness or headache.  No other specific complaints in a complete review of systems (except as listed in HPI above).  Objective  Vitals:   09/16/17 0924  BP: 122/68  Pulse: 77  Resp: 16  Temp: 97.9 F (36.6 C)  SpO2: 97%  Weight: 177 lb (80.3 kg)  Height:  (1.6 m)    Body mass index is 31.35 kg/m.  Physical Exam  Constitutional: Patient appears well-developed and well-nourished, obese. No distress.  HENT: Head: Normocephalic and atraumatic. Ears: B TMs ok, no erythema or effusion; Nose: Nose normal. Mouth/Throat: Oropharynx is clear and moist. No oropharyngeal exudate.  Eyes: Conjunctivae and EOM are normal. Pupils are equal, round, and reactive to light. No scleral icterus.  Neck: Normal range of motion. Neck supple. No JVD present. No thyromegaly present.  Cardiovascular: Normal rate, regular rhythm and normal heart sounds.  No murmur heard. No BLE edema. Pulmonary/Chest: Effort normal and breath sounds normal. No respiratory distress. Abdominal: Soft. Bowel sounds are normal, no distension. There is no tenderness. no masses Breast: no lumps or masses, no nipple discharge or rashes FEMALE GENITALIA:   Not done RECTAL: not done Musculoskeletal: decrease rom of right shoulder,  no joint effusions. No gross deformities Neurological: he is alert and oriented to person, place, and time. No cranial nerve deficit. Coordination, balance, strength, speech and gait are normal.  Skin: Skin is warm and dry. No rash noted. No erythema.  Psychiatric: Patient has a normal mood and affect. behavior is normal. Judgment and thought content normal.  Recent Results (from the past 2160 hour(s))  POCT Glucose (CBG)     Status: Normal   Collection Time: 07/26/17  2:38 PM  Result Value Ref Range   POC Glucose 105 (A) 70 - 99 mg/dl  POCT HgB Z6X     Status: Abnormal   Collection Time: 07/26/17  2:40 PM  Result Value Ref Range   Hemoglobin A1C 7.3  PHQ2/9: Depression screen [facility] 2/9 09/16/2017 08/19/2016 07/23/2016 05/11/2016 04/21/2016  Decreased Interest 0 0 0 0 0  Down, Depressed, Hopeless 0 0 0 0 0  PHQ - 2 Score 0 0 0 0 0     Fall Risk: Fall Risk  09/16/2017 08/19/2016 07/23/2016 05/11/2016 04/21/2016  Falls in the past year? No No No No No    Functional Status Survey: Is the patient deaf or have difficulty hearing?: No Does the patient have difficulty seeing, even when wearing glasses/contacts?: No Does the patient have difficulty concentrating, remembering, or making decisions?: No Does the patient have difficulty walking or climbing stairs?: No Does the patient have difficulty dressing or bathing?: No Does the patient have difficulty doing errands alone such as visiting a doctor's office or shopping?: No   Assessment & Plan  1. Well woman exam  Discussed importance of 150 minutes of physical activity weekly, eat two servings of fish weekly, eat one serving of tree nuts ( cashews, pistachios, pecans, almonds.Marland Kitchen) every other day, eat 6 servings of fruit/vegetables daily and drink plenty of water and avoid sweet beverages.   - Lipid panel - COMPLETE METABOLIC PANEL WITH GFR - CBC with  Differential/Platelet - VITAMIN D 25 Hydroxy (Vit-D Deficiency, Fractures) - Vitamin B12 - TSH - HIV antibody - RPR  2. Need for influenza vaccination  - Flu Vaccine QUAD 6+ mos PF IM (Fluarix Quad PF)  3. Screening breast examination  - MM Digital Diagnostic Unilat L; Future  4. Low magnesium level  - Magnesium Oxide 400 MG CAPS; Take 1 capsule (400 mg total) by mouth 2 (two) times daily.  Dispense: 60 capsule; Refill: 5  5. Cervical cancer screening  Not due yet  6. Routine screening for STI (sexually transmitted infection)  - HIV antibody - RPR

## 2017-09-17 ENCOUNTER — Other Ambulatory Visit: Payer: Self-pay | Admitting: Family Medicine

## 2017-09-17 LAB — COMPLETE METABOLIC PANEL WITH GFR
AG Ratio: 1.2 (calc) (ref 1.0–2.5)
ALT: 14 U/L (ref 6–29)
AST: 19 U/L (ref 10–35)
Albumin: 4.1 g/dL (ref 3.6–5.1)
Alkaline phosphatase (APISO): 145 U/L — ABNORMAL HIGH (ref 33–130)
BUN/Creatinine Ratio: 10 (calc) (ref 6–22)
BUN: 13 mg/dL (ref 7–25)
CO2: 28 mmol/L (ref 20–32)
Calcium: 9 mg/dL (ref 8.6–10.4)
Chloride: 109 mmol/L (ref 98–110)
Creat: 1.24 mg/dL — ABNORMAL HIGH (ref 0.50–1.05)
GFR, Est African American: 55 mL/min/{1.73_m2} — ABNORMAL LOW (ref >=60)
GFR, Est Non African American: 48 mL/min/{1.73_m2} — ABNORMAL LOW (ref >=60)
Globulin: 3.3 g/dL (calc) (ref 1.9–3.7)
Glucose, Bld: 90 mg/dL (ref 65–99)
Potassium: 4 mmol/L (ref 3.5–5.3)
Sodium: 142 mmol/L (ref 135–146)
Total Bilirubin: 0.3 mg/dL (ref 0.2–1.2)
Total Protein: 7.4 g/dL (ref 6.1–8.1)

## 2017-09-17 LAB — LIPID PANEL
Cholesterol: 131 mg/dL (ref <200)
HDL: 52 mg/dL (ref >50)
LDL Cholesterol (Calc): 63 mg/dL (calc)
Non-HDL Cholesterol (Calc): 79 mg/dL (calc) (ref <130)
Total CHOL/HDL Ratio: 2.5 (calc) (ref <5.0)
Triglycerides: 78 mg/dL (ref <150)

## 2017-09-17 LAB — CBC WITH DIFFERENTIAL/PLATELET
Basophils Absolute: 40 cells/uL (ref 0–200)
Basophils Relative: 0.5 %
Eosinophils Absolute: 221 cells/uL (ref 15–500)
Eosinophils Relative: 2.8 %
HCT: 34.3 % — ABNORMAL LOW (ref 35.0–45.0)
Hemoglobin: 11.2 g/dL — ABNORMAL LOW (ref 11.7–15.5)
Lymphs Abs: 2686 cells/uL (ref 850–3900)
MCH: 26.2 pg — ABNORMAL LOW (ref 27.0–33.0)
MCHC: 32.7 g/dL (ref 32.0–36.0)
MCV: 80.3 fL (ref 80.0–100.0)
MPV: 11.5 fL (ref 7.5–12.5)
Monocytes Relative: 5.8 %
Neutro Abs: 4495 cells/uL (ref 1500–7800)
Neutrophils Relative %: 56.9 %
Platelets: 226 10*3/uL (ref 140–400)
RBC: 4.27 10*6/uL (ref 3.80–5.10)
RDW: 14.3 % (ref 11.0–15.0)
Total Lymphocyte: 34 %
WBC mixed population: 458 cells/uL (ref 200–950)
WBC: 7.9 10*3/uL (ref 3.8–10.8)

## 2017-09-17 LAB — RPR: RPR Ser Ql: NONREACTIVE

## 2017-09-17 LAB — VITAMIN D 25 HYDROXY (VIT D DEFICIENCY, FRACTURES): Vit D, 25-Hydroxy: 25 ng/mL — ABNORMAL LOW (ref 30–100)

## 2017-09-17 LAB — HIV ANTIBODY (ROUTINE TESTING W REFLEX): HIV 1&2 Ab, 4th Generation: NONREACTIVE

## 2017-09-17 LAB — TSH: TSH: 1.64 mIU/L (ref 0.40–4.50)

## 2017-09-17 LAB — VITAMIN B12: Vitamin B-12: 597 pg/mL (ref 200–1100)

## 2017-09-17 MED ORDER — VITAMIN D (ERGOCALCIFEROL) 1.25 MG (50000 UNIT) PO CAPS: 50000.0000 [IU] | ORAL_CAPSULE | ORAL | 0 refills | Status: DC

## 2017-09-20 ENCOUNTER — Ambulatory Visit
Admission: RE | Admit: 2017-09-20 | Discharge: 2017-09-20 | Disposition: A | Discharge status: Home / Self Care | Payer: BLUE CROSS/BLUE SHIELD | Source: Ambulatory Visit | Attending: Family Medicine | Admitting: Family Medicine

## 2017-09-20 DIAGNOSIS — Z1231 Encounter for screening mammogram for malignant neoplasm of breast: Secondary | ICD-10-CM | POA: Diagnosis not present

## 2017-09-29 ENCOUNTER — Other Ambulatory Visit: Payer: Self-pay | Admitting: Family Medicine

## 2017-09-29 ENCOUNTER — Other Ambulatory Visit: Payer: Self-pay

## 2017-09-29 DIAGNOSIS — H524 Presbyopia: Secondary | ICD-10-CM | POA: Diagnosis not present

## 2017-09-29 DIAGNOSIS — D509 Iron deficiency anemia, unspecified: Secondary | ICD-10-CM

## 2017-09-29 LAB — HM DIABETES EYE EXAM

## 2017-09-29 LAB — FECAL OCCULT BLOOD, GUAIAC: Fecal Occult Blood: NEGATIVE

## 2017-09-29 LAB — POC HEMOCCULT BLD/STL (HOME/3-CARD/SCREEN)
Card #2 Fecal Occult Blod, POC: NEGATIVE
Card #3 Fecal Occult Blood, POC: NEGATIVE
Fecal Occult Blood, POC: NEGATIVE

## 2017-10-13 ENCOUNTER — Other Ambulatory Visit: Payer: Self-pay | Admitting: Family Medicine

## 2017-10-13 NOTE — Telephone Encounter (Signed)
Patient does not need her Magnesium since she just had a prescription sent in on 09/16/2017 with  5 refills. This is a refill request from pharmacy. Patient does need another prescription for Flexeril. I'm not authorized to refuse Magnesium.

## 2017-10-19 ENCOUNTER — Ambulatory Visit: Payer: Self-pay | Admitting: *Deleted

## 2017-10-19 NOTE — Telephone Encounter (Signed)
   Reason for Disposition . Unexplained nausea  Answer Assessment - Initial Assessment Questions 1. NAUSEA SEVERITY: "How bad is the nausea?" (e.g., mild, moderate, severe; dehydration, weight loss)   - MILD: loss of appetite without change in eating habits   - MODERATE: decreased oral intake without significant weight loss, dehydration, or malnutrition   - SEVERE: inadequate caloric or fluid intake, significant weight loss, symptoms of dehydration   mild 2. ONSET: "When did the nausea begin?"     Friday 3. VOMITING: "Any vomiting?" If so, ask: "How many times today?"     Saturday had vomiting, no vomiting today 4. RECURRENT SYMPTOM: "Have you had nausea before?" If so, ask: "When was the last time?" "What happened that time?"     Had nausea before, but it has been a while 5. CAUSE: "What do you think is causing the nausea?"     unknown 6. PREGNANCY: "Is there any chance you are pregnant?" (e.g., unprotected intercourse, missed birth control pill, broken condom)     No  Protocols used: NAUSEA-A-AH

## 2017-10-27 ENCOUNTER — Encounter: Payer: Self-pay | Admitting: Family Medicine

## 2017-10-27 ENCOUNTER — Ambulatory Visit (INDEPENDENT_AMBULATORY_CARE_PROVIDER_SITE_OTHER): Payer: BLUE CROSS/BLUE SHIELD | Admitting: Family Medicine

## 2017-10-27 VITALS — BP 116/64 | HR 96 | Temp 98.1°F | Resp 16 | Ht 63.0 in | Wt 169.6 lb

## 2017-10-27 DIAGNOSIS — M5442 Lumbago with sciatica, left side: Secondary | ICD-10-CM | POA: Diagnosis not present

## 2017-10-27 DIAGNOSIS — E785 Hyperlipidemia, unspecified: Secondary | ICD-10-CM | POA: Diagnosis not present

## 2017-10-27 DIAGNOSIS — N183 Chronic kidney disease, stage 3 unspecified: Secondary | ICD-10-CM

## 2017-10-27 DIAGNOSIS — E559 Vitamin D deficiency, unspecified: Secondary | ICD-10-CM

## 2017-10-27 DIAGNOSIS — I700 Atherosclerosis of aorta: Secondary | ICD-10-CM | POA: Diagnosis not present

## 2017-10-27 DIAGNOSIS — G8929 Other chronic pain: Secondary | ICD-10-CM

## 2017-10-27 DIAGNOSIS — D573 Sickle-cell trait: Secondary | ICD-10-CM

## 2017-10-27 DIAGNOSIS — I10 Essential (primary) hypertension: Secondary | ICD-10-CM

## 2017-10-27 DIAGNOSIS — G4709 Other insomnia: Secondary | ICD-10-CM | POA: Diagnosis not present

## 2017-10-27 DIAGNOSIS — K219 Gastro-esophageal reflux disease without esophagitis: Secondary | ICD-10-CM | POA: Diagnosis not present

## 2017-10-27 DIAGNOSIS — M25511 Pain in right shoulder: Secondary | ICD-10-CM

## 2017-10-27 DIAGNOSIS — R790 Abnormal level of blood mineral: Secondary | ICD-10-CM | POA: Diagnosis not present

## 2017-10-27 DIAGNOSIS — E1142 Type 2 diabetes mellitus with diabetic polyneuropathy: Secondary | ICD-10-CM | POA: Diagnosis not present

## 2017-10-27 DIAGNOSIS — M5441 Lumbago with sciatica, right side: Secondary | ICD-10-CM

## 2017-10-27 DIAGNOSIS — E1122 Type 2 diabetes mellitus with diabetic chronic kidney disease: Secondary | ICD-10-CM | POA: Diagnosis not present

## 2017-10-27 DIAGNOSIS — M25512 Pain in left shoulder: Secondary | ICD-10-CM

## 2017-10-27 LAB — POCT UA - MICROALBUMIN: Microalbumin Ur, POC: 20 mg/L

## 2017-10-27 LAB — POCT GLYCOSYLATED HEMOGLOBIN (HGB A1C): Hemoglobin A1C: 6.2

## 2017-10-27 MED ORDER — OMEPRAZOLE 20 MG PO CPDR: 20.0000 mg | DELAYED_RELEASE_CAPSULE | Freq: Every day | ORAL | 5 refills | Status: DC

## 2017-10-27 MED ORDER — AMLODIPINE BESYLATE-VALSARTAN 5-160 MG PO TABS: 1.0000 | ORAL_TABLET | Freq: Every day | ORAL | 5 refills | Status: DC

## 2017-10-27 MED ORDER — ATORVASTATIN CALCIUM 20 MG PO TABS: 20.0000 mg | ORAL_TABLET | Freq: Every day | ORAL | 5 refills | Status: DC

## 2017-10-27 MED ORDER — GABAPENTIN 300 MG PO CAPS: 300.0000 mg | ORAL_CAPSULE | Freq: Two times a day (BID) | ORAL | 5 refills | Status: DC

## 2017-10-27 MED ORDER — TRAMADOL HCL 50 MG PO TABS: 50.0000 mg | ORAL_TABLET | Freq: Two times a day (BID) | ORAL | 2 refills | Status: DC | PRN

## 2017-10-27 MED ORDER — TEMAZEPAM 15 MG PO CAPS: 15.0000 mg | ORAL_CAPSULE | ORAL | 2 refills | Status: DC | PRN

## 2017-10-27 NOTE — Progress Notes (Signed)
Name: Brandy Johnston   MRN: [id number]    DOB: [birth year]   Date:10/27/2017       Progress Note  Subjective  Chief Complaint  Chief Complaint  Patient presents with  . Medication Refill    3 month F/U  . Diabetes    Needs a new meter  . Insomnia    Wakes up in the middle of night  . Hypertension    Denies any symptoms  . Hyperlipidemia  . Gastroesophageal Reflux    States everytime she eats she has been having diarrhea for the past 2 weeks, bloating, acid coming back in her throat, sluggish feeling, has lost 8 pounds due to no appetite.  . Migraine    States it has been every now and then, but not been too bad    HPI  DMII with renal manifestation, and neuropathy. States now finger tips and toes are better since she has been magnesium and gabapentin. She has not been checking her glucose levels at home . Denies polyphagia, polydipsia or polyuria. She is offMetformin, did not like taking medication. She has been more compliant with her diet, stopped drinking KoolAid and seldom drinking sodas, she is now on Ozempic and has noticed that it curbs her appetite, had two episodes of vomiting and has some nausea, still on .025 mg dose weekly, denies abdominal pain, and symptoms better when she does not over eat.   ARB for kidney protection.   Insomnia: She is taking Temazepam prn, wakes up at 3 am but able to fall back asleep,  she states sometimes she wakes up to void. She usually wakes up feeling rested in am.   HTN: taking medication,  bp is towards low end of normal , no chest pain , occasionally has palpitation ( seen cardiologist and was given Lopressor but she stopped because it did not improve symptoms)  - no pain or diaphoresis or SOB associated with flutters She denies dizziness, therefore we will monitor, if bp drops more she will try half pill daily.   Hyperlipidemia: reviewed labs and last LDL is at goal, continue medication   GERD: she is back on Omeprazole once daily  because while on Ranitidine symptoms got worse, she is aware of long term risk of PPI use. She has noticed two episodes of vomiting over the past two weeks, after she ate a hot dog and another time after eating a bologna sandwich.   Chronic low back pain with  lumbar radiculitis:no recent flares,The left outer thigh and bottom of left foot stays numb. She had NCS and MRI. She does not want to see Dr. Council Mechanic at this time or get PT because of cost, she has not started home exercises She has a different job position now, and it has helped with intensity of back pain. 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   Low calcium and magnesium: seen by Endo still taking supplementation, we were supposed to stop Omeprazole and start Ranitidine, but unable to tolerate the switch, she has been taking it daily.   Migraine: she has episodes seldom, usually temporal, sharp, associated with photophobia, but no phonophobia. No nausea or vomiting. She takes prn Fioricet prn, still has a half bottle at home  Atherosclerosis Aorta: taking aspirin and is back on statin therapy   Patient Active Problem List   Diagnosis Date Noted  .  Chronic bilateral back pain 07/26/2017  . Bilateral shoulder pain 07/26/2017  . Coronary artery calcification 05/17/2017  . Heart palpitations 05/15/2017  . History of acute gastritis   . Leukocytosis 09/30/2016  . Atherosclerosis of abdominal aorta (HCC) 08/18/2016  . Type 2 diabetes mellitus with stage 3 chronic kidney disease, without long-term current use of insulin (HCC) 12/16/2015  . Diabetic neuropathy associated with type 2 diabetes mellitus (HCC) 09/18/2015  . Insomnia 09/18/2015  . Benign essential HTN 06/18/2015  . Chronic kidney disease (CKD), stage III (moderate) (HCC) 06/18/2015  . Diabetes mellitus with renal manifestation (HCC) 06/18/2015  .  Abnormal serum level of alkaline phosphatase 06/18/2015  . Bilateral low back pain with left-sided sciatica 06/18/2015  . Obesity (BMI 30.0-34.9) 06/18/2015  . Degenerative arthritis of hip 06/18/2015  . Sickle cell trait (HCC) 06/18/2015  . Dyslipidemia 05/27/2010  . Gastro-esophageal reflux disease without esophagitis 05/25/2008    Past Surgical History:  Procedure Laterality Date  . CHOLECYSTECTOMY    . COLONOSCOPY    . ESOPHAGOGASTRODUODENOSCOPY (EGD) WITH PROPOFOL N/A 10/05/2016   Procedure: ESOPHAGOGASTRODUODENOSCOPY (EGD) WITH PROPOFOL;  Surgeon: Midge Miniumarren Wohl, MD;  [facility];  Service: Endoscopy;  Laterality: N/A;  . FRACTURE SURGERY Left    cast and pins   . TUBAL LIGATION      Family History  Problem Relation Age of Onset  . Migraines Mother   . Diabetes Mother   . Cancer Mother        lung  . Arthritis Brother   . Breast cancer Maternal Aunt   . Cancer Maternal Uncle        Lung and Colon  . Cirrhosis Brother   . Breast cancer Cousin     Social History   Social History  . Marital status: Single    Spouse name: N/A  . Number of children: N/A  . Years of education: N/A   Occupational History  . Not on file.   Social History Main Topics  . Smoking status: Never Smoker  . Smokeless tobacco: Never Used  . Alcohol use 0.0 oz/week     Comment: rare  . Drug use: Yes    Types: Marijuana     Comment: smokes marijuana occasionally  . Sexual activity: Yes    Birth control/ protection: Other-see comments     Comment: homosexual    Other Topics Concern  . Not on file   Social History Narrative  . No narrative on file     Current Outpatient Prescriptions:  .  amLODipine-valsartan (EXFORGE) 5-160 MG tablet, Take 1 tablet by mouth daily., Disp: 30 tablet, Rfl: 5 .  aspirin 81 MG chewable tablet, Chew 1 tablet by mouth as needed., Disp: , Rfl:  .  atorvastatin (LIPITOR) 20 MG tablet, Take 1 tablet (20 mg total) by mouth daily., Disp: 30  tablet, Rfl: 5 .  Butalbital-APAP-Caffeine 50-300-40 MG CAPS, take 1 capsule by mouth every 6 hours if needed, Disp: 40 capsule, Rfl: 0 .  Cholecalciferol (VITAMIN D) 2000 units CAPS, Take 1 capsule (2,000 Units total) by mouth daily., Disp: 30 capsule, Rfl: 0 .  cyclobenzaprine (FLEXERIL) 5 MG tablet, take 1 tablet by mouth every 8 hours if needed, Disp: 90 tablet, Rfl: 2 .  gabapentin (NEURONTIN) 300 MG capsule, Take 1 capsule (300 mg total) by mouth 2 (two) times daily., Disp: 60 capsule, Rfl: 5 .  Magnesium Oxide 400 MG CAPS, Take 1 capsule (400 mg total) by mouth 2 (two) times daily., Disp:  60 capsule, Rfl: 5 .  meloxicam (MOBIC) 7.5 MG tablet, Take 1 tablet (7.5 mg total) by mouth daily., Disp: 30 tablet, Rfl: 2 .  omeprazole (PRILOSEC) 20 MG capsule, Take 1 capsule (20 mg total) by mouth daily., Disp: 30 capsule, Rfl: 5 .  Semaglutide (OZEMPIC) 0.25 or 0.5 MG/DOSE SOPN, Inject 0.5 mg into the skin once a week., Disp: 2 pen, Rfl: 2 .  temazepam (RESTORIL) 15 MG capsule, Take 1 capsule (15 mg total) by mouth as needed., Disp: 30 capsule, Rfl: 2 .  traMADol (ULTRAM) 50 MG tablet, Take 1 tablet (50 mg total) by mouth every 12 (twelve) hours as needed. For pain, Disp: 60 tablet, Rfl: 2 .  Vitamin D, Ergocalciferol, (DRISDOL) 50000 units CAPS capsule, Take 1 capsule (50,000 Units total) by mouth every 7 (seven) days., Disp: 12 capsule, Rfl: 0  No Known Allergies   ROS  Constitutional: Negative for fever, positive for weight change.  Respiratory: Negative for cough and shortness of breath.   Cardiovascular: Negative for chest pain or palpitations.  Gastrointestinal: Negative for abdominal pain, no bowel changes.  Musculoskeletal: Negative for gait problem or joint swelling.  Skin: Negative for rash.  Neurological: Negative for dizziness or headache.  No other specific complaints in a complete review of systems (except as listed in HPI above).  Objective  Vitals:   10/27/17 1305  BP:  116/64  Pulse: 96  Resp: 16  Temp: 98.1 F (36.7 C)  TempSrc: Oral  SpO2: 96%  Weight: 169 lb 9.6 oz (76.9 kg)  Height: 5\' 3"  (1.6 m)    Body mass index is 30.04 kg/m.  Physical Exam  Constitutional: Patient appears well-developed and well-nourished. Obese  No distress.  HEENT: head atraumatic, normocephalic, pupils equal and reactive to light, neck supple, throat within normal limits Cardiovascular: Normal rate, regular rhythm and normal heart sounds.  No murmur heard. No BLE edema. Pulmonary/Chest: Effort normal and breath sounds normal. No respiratory distress. Abdominal: Soft.  There is no tenderness. Psychiatric: Patient has a normal mood and affect. behavior is normal. Judgment and thought content normal. Muscular Skeletal: normal back exam, negative straight leg raise  Recent Results (from the past 2160 hour(s))  Lipid panel     Status: None   Collection Time: 09/16/17  9:58 AM  Result Value Ref Range   Cholesterol 131 <200 mg/dL   HDL 52 >16 mg/dL   Triglycerides 78 <109 mg/dL   LDL Cholesterol (Calc) 63 mg/dL (calc)    Comment: Reference range: <100 . Desirable range <100 mg/dL for primary prevention;   <70 mg/dL for patients with CHD or diabetic patients  with > or = 2 CHD risk factors. Marland Kitchen LDL-C is now calculated using the Martin-Hopkins  calculation, which is a validated novel method providing  better accuracy than the Friedewald equation in the  estimation of LDL-C.  Horald Pollen et al. Lenox Ahr. 6045;409(81): 2061-2068  (http://education.[facility].com/faq/FAQ164)    Total CHOL/HDL Ratio 2.5 <5.0 (calc)   Non-HDL Cholesterol (Calc) 79 <191 mg/dL (calc)    Comment: For patients with diabetes plus 1 major ASCVD risk  factor, treating to a non-HDL-C goal of <100 mg/dL  (LDL-C of <47 mg/dL) is considered a therapeutic  option.   COMPLETE METABOLIC PANEL WITH GFR     Status: Abnormal   Collection Time: 09/16/17  9:58 AM  Result Value Ref Range   Glucose,  Bld 90 65 - 99 mg/dL    Comment: .  Fasting reference interval .    BUN 13 7 - 25 mg/dL   Creat 1.61 (H) 0.96 - 1.05 mg/dL    Comment: For patients >88 years of age, the reference limit for Creatinine is approximately 13% higher for people identified as African-American. .    GFR, Est Non African American 48 (L) > OR = 60 mL/min/1.35m2   GFR, Est African American 55 (L) > OR = 60 mL/min/1.17m2   BUN/Creatinine Ratio 10 6 - 22 (calc)   Sodium 142 135 - 146 mmol/L   Potassium 4.0 3.5 - 5.3 mmol/L   Chloride 109 98 - 110 mmol/L   CO2 28 20 - 32 mmol/L   Calcium 9.0 8.6 - 10.4 mg/dL   Total Protein 7.4 6.1 - 8.1 g/dL   Albumin 4.1 3.6 - 5.1 g/dL   Globulin 3.3 1.9 - 3.7 g/dL (calc)   AG Ratio 1.2 1.0 - 2.5 (calc)   Total Bilirubin 0.3 0.2 - 1.2 mg/dL   Alkaline phosphatase (APISO) 145 (H) 33 - 130 U/L   AST 19 10 - 35 U/L   ALT 14 6 - 29 U/L  CBC with Differential/Platelet     Status: Abnormal   Collection Time: 09/16/17  9:58 AM  Result Value Ref Range   WBC 7.9 3.8 - 10.8 Thousand/uL   RBC 4.27 3.80 - 5.10 Million/uL   Hemoglobin 11.2 (L) 11.7 - 15.5 g/dL   HCT 04.5 (L) 40.9 - 81.1 %   MCV 80.3 80.0 - 100.0 fL   MCH 26.2 (L) 27.0 - 33.0 pg   MCHC 32.7 32.0 - 36.0 g/dL   RDW 91.4 78.2 - 95.6 %   Platelets 226 140 - 400 Thousand/uL   MPV 11.5 7.5 - 12.5 fL   Neutro Abs 4,495 1,500 - 7,800 cells/uL   Lymphs Abs 2,686 850 - 3,900 cells/uL   WBC mixed population 458 200 - 950 cells/uL   Eosinophils Absolute 221 15 - 500 cells/uL   Basophils Absolute 40 0 - 200 cells/uL   Neutrophils Relative % 56.9 %   Total Lymphocyte 34.0 %   Monocytes Relative 5.8 %   Eosinophils Relative 2.8 %   Basophils Relative 0.5 %  VITAMIN D 25 Hydroxy (Vit-D Deficiency, Fractures)     Status: Abnormal   Collection Time: 09/16/17  9:58 AM  Result Value Ref Range   Vit D, 25-Hydroxy 25 (L) 30 - 100 ng/mL    Comment: Vitamin D Status         25-OH Vitamin D: . Deficiency:                     <20 ng/mL Insufficiency:             20 - 29 ng/mL Optimal:                 > or = 30 ng/mL . For 25-OH Vitamin D testing on patients on  D2-supplementation and patients for whom quantitation  of D2 and D3 fractions is required, the [facility](TM) 25-OH VIT D, (D2,D3), LC/MS/MS is recommended: order  code 21308 (patients >36yrs). . For more information on this test, go to: http://education.[facility].com/faq/FAQ163 (This link is being provided for  informational/educational purposes only.)   Vitamin B12     Status: None   Collection Time: 09/16/17  9:58 AM  Result Value Ref Range   Vitamin B-12 597 200 - 1,100 pg/mL  TSH     Status: None   Collection Time:  09/16/17  9:58 AM  Result Value Ref Range   TSH 1.64 0.40 - 4.50 mIU/L  HIV antibody     Status: None   Collection Time: 09/16/17  9:58 AM  Result Value Ref Range   HIV 1&2 Ab, 4th Generation NON-REACTIVE NON-REACTI    Comment: HIV-1 antigen and HIV-1/HIV-2 antibodies were not detected. There is no laboratory evidence of HIV infection. Marland Kitchen PLEASE NOTE: This information has been disclosed to you from records whose confidentiality may be protected by state law.  If your state requires such protection, then the state law prohibits you from making any further disclosure of the information without the specific written consent of the person to whom it pertains, or as otherwise permitted by law. A general authorization for the release of medical or other information is NOT sufficient for this purpose. . For additional information please refer to http://education.[facility].com/faq/FAQ106 (This link is being provided for informational/ educational purposes only.) . Marland Kitchen The performance of this assay has not been clinically validated in patients less than 72 years old. .   RPR     Status: None   Collection Time: 09/16/17  9:58 AM  Result Value Ref Range   RPR Ser Ql NON-REACTIVE NON-REACTI  Fecal Occult  Blood, Guaiac     Status: None   Collection Time: 09/29/17 12:00 AM  Result Value Ref Range   Fecal Occult Blood Negative   POC Hemoccult Bld/Stl (3-Cd Home Screen)     Status: Normal   Collection Time: 09/29/17  4:09 PM  Result Value Ref Range   Card #1 Date 09/22/2017    Fecal Occult Blood, POC Negative Negative   Card #2 Date 09/24/2017    Card #2 Fecal Occult Blod, POC Negative    Card #3 Date 09/28/2017    Card #3 Fecal Occult Blood, POC Negative   POCT HgB A1C     Status: None   Collection Time: 10/27/17  1:15 PM  Result Value Ref Range   Hemoglobin A1C 6.2   POCT UA - Microalbumin     Status: None   Collection Time: 10/27/17  1:15 PM  Result Value Ref Range   Microalbumin Ur, POC 20 mg/L   Creatinine, POC  mg/dL   Albumin/Creatinine Ratio, Urine, POC       PHQ2/9: Depression screen [facility] 2/9 09/16/2017 08/19/2016 07/23/2016 05/11/2016 04/21/2016  Decreased Interest 0 0 0 0 0  Down, Depressed, Hopeless 0 0 0 0 0  PHQ - 2 Score 0 0 0 0 0     Fall Risk: Fall Risk  09/16/2017 08/19/2016 07/23/2016 05/11/2016 04/21/2016  Falls in the past year? No No No No No     Assessment & Plan  1. Type 2 diabetes mellitus with stage 3 chronic kidney disease, without long-term current use of insulin (HCC)  - POCT HgB A1C - POCT UA - Microalbumin Explained that nausea is common with medication, but cannot have pain  2. Low magnesium level  Continue supplementation   3. Sickle cell trait (HCC)   4. Diabetic polyneuropathy associated with type 2 diabetes mellitus (HCC)  - gabapentin (NEURONTIN) 300 MG capsule; Take 1 capsule (300 mg total) by mouth 2 (two) times daily.  Dispense: 60 capsule; Refill: 5 Doing well  5. Atherosclerosis of abdominal aorta (HCC)  Continue statin therapy  6. Vitamin D deficiency  Continue vitamin D supplementation  7. Gastro-esophageal reflux disease without esophagitis  - omeprazole (PRILOSEC) 20 MG capsule; Take 1 capsule (20 mg total) by  mouth  daily.  Dispense: 30 capsule; Refill: 5  8. Dyslipidemia  - atorvastatin (LIPITOR) 20 MG tablet; Take 1 tablet (20 mg total) by mouth daily.  Dispense: 30 tablet; Refill: 5  9. Benign essential HTN  - amLODipine-valsartan (EXFORGE) 5-160 MG tablet; Take 1 tablet by mouth daily.  Dispense: 30 tablet; Refill: 5  10. Chronic bilateral low back pain with left-sided sciatica  - gabapentin (NEURONTIN) 300 MG capsule; Take 1 capsule (300 mg total) by mouth 2 (two) times daily.  Dispense: 60 capsule; Refill: 5  11. Chronic bilateral low back pain with bilateral sciatica  - traMADol (ULTRAM) 50 MG tablet; Take 1 tablet (50 mg total) by mouth every 12 (twelve) hours as needed. For pain  Dispense: 60 tablet; Refill: 2  12. Chronic pain of both shoulders  - traMADol (ULTRAM) 50 MG tablet; Take 1 tablet (50 mg total) by mouth every 12 (twelve) hours as needed. For pain  Dispense: 60 tablet; Refill: 2  13. Other insomnia  - temazepam (RESTORIL) 15 MG capsule; Take 1 capsule (15 mg total) by mouth as needed.  Dispense: 30 capsule; Refill: 2

## 2018-01-01 IMAGING — CT CT ABD-PELV W/ CM
2 of 5 series · 16 of 46 positions shown, 18 images · IV contrast (APPLIED)
Comparison: 03/29/2009

CLINICAL DATA: Right lower quadrant pain. Nausea, vomiting,
diarrhea since this morning. Previous BTL and cholecystectomy.

EXAM:
CT ABDOMEN AND PELVIS WITH CONTRAST
TECHNIQUE: Multidetector CT imaging of the abdomen and pelvis was performed
using the standard protocol following bolus administration of
intravenous contrast.
CONTRAST:  100mL 3FQNQO-URR IOPAMIDOL (3FQNQO-URR) INJECTION 61%

[Series 2: axial st · axial · 0.75mm/px · z∈[-680,-255]mm · 13 of 96 slices shown, 15 images]
[im 6/96  soft-tissue]
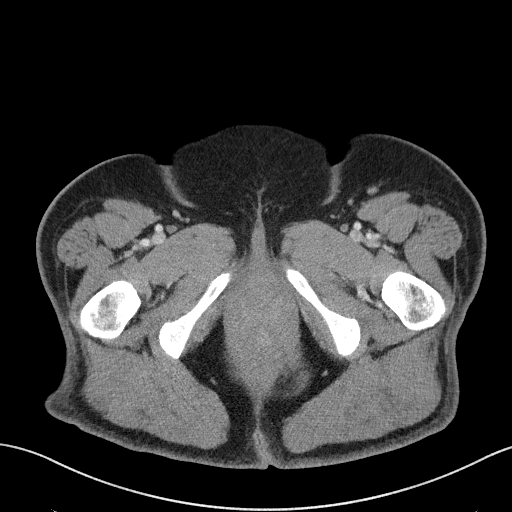
[im 6/96  bone]
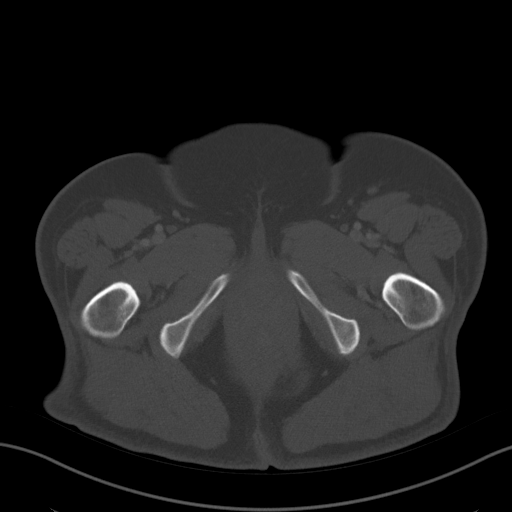
[im 16/96  soft-tissue]
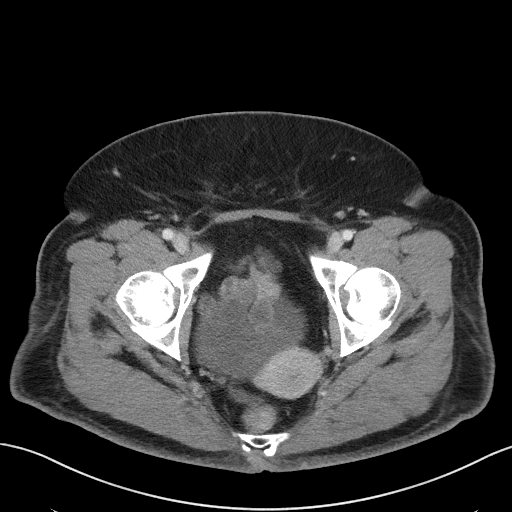
[im 21/96  soft-tissue]
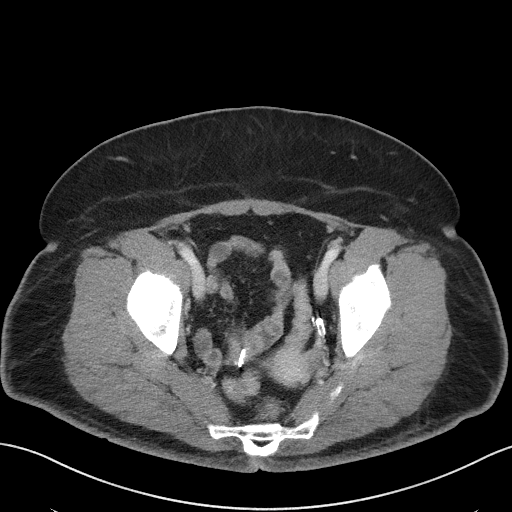
[im 26/96  soft-tissue]
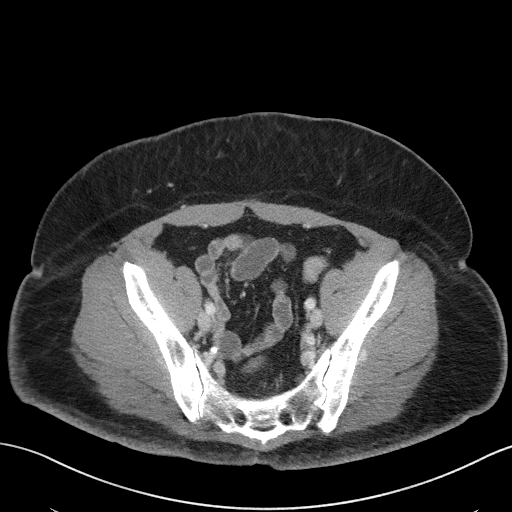
[im 36/96  soft-tissue]
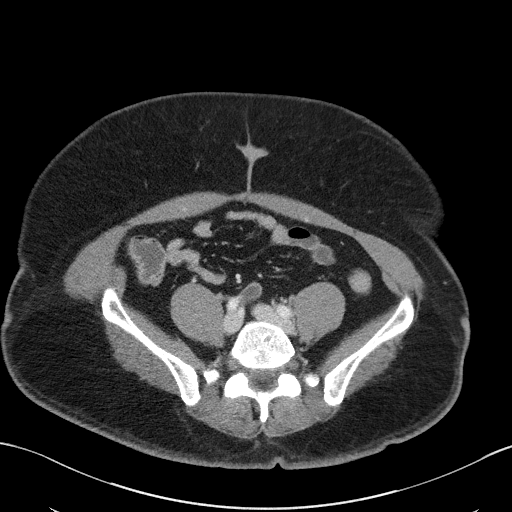
[im 41/96  soft-tissue]
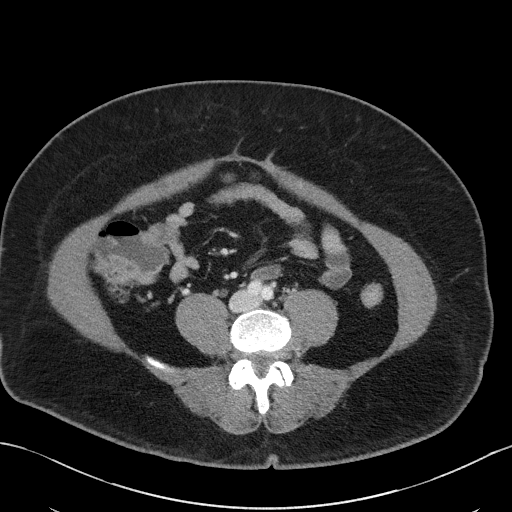
[im 51/96  soft-tissue]
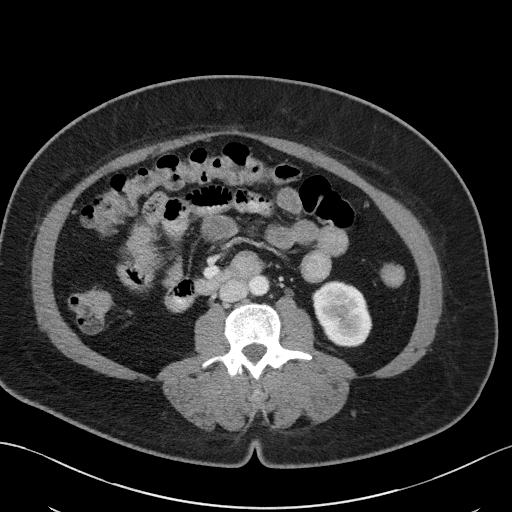
[im 56/96  soft-tissue]
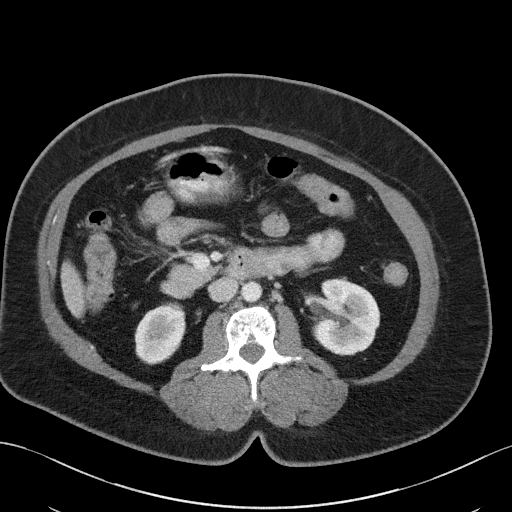
[im 61/96  soft-tissue]
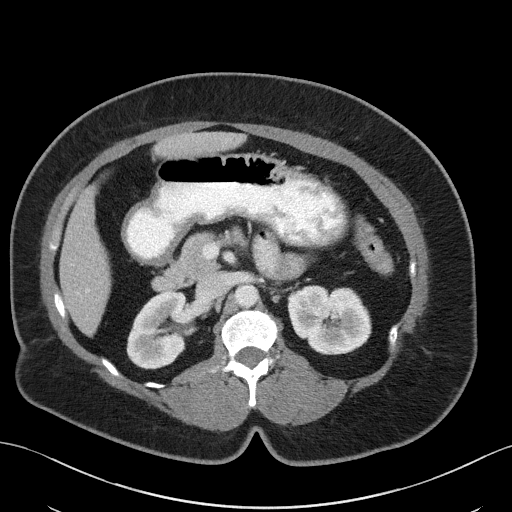
[im 61/96  bone]
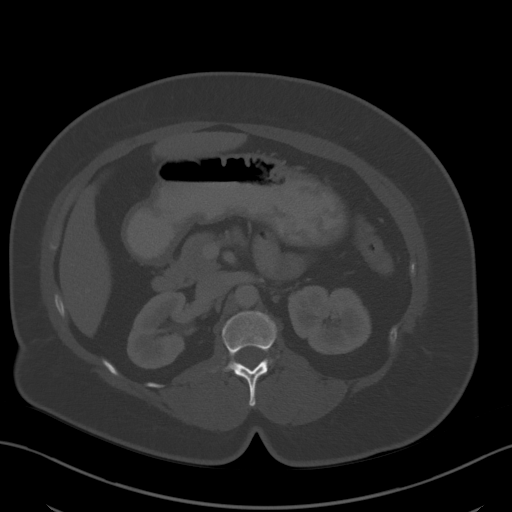
[im 71/96  soft-tissue]
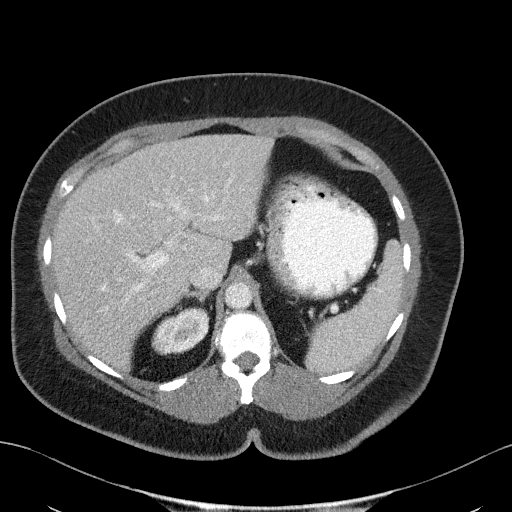
[im 76/96  soft-tissue]
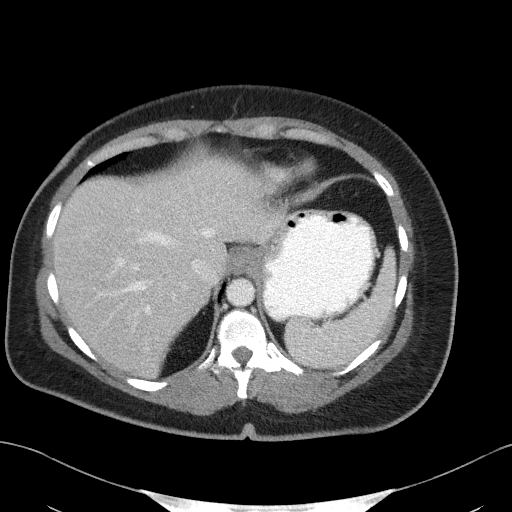
[im 81/96  soft-tissue]
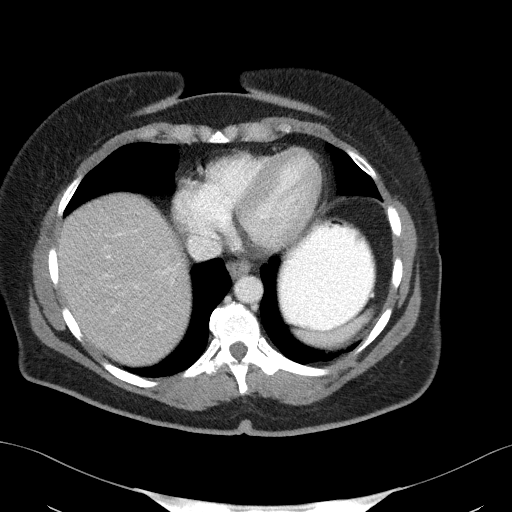
[im 91/96  soft-tissue]
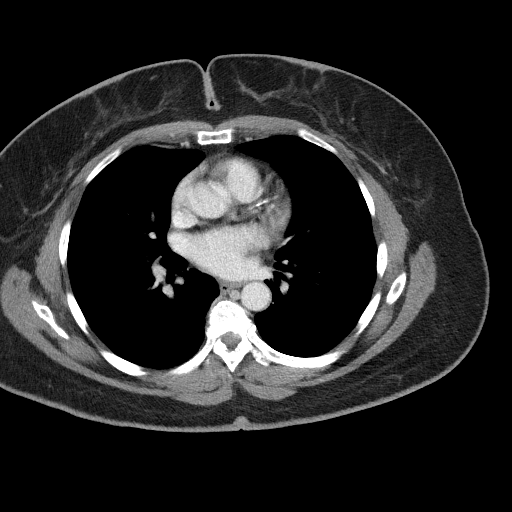

[Series 5: coronal st · coronal · 0.73mm/px · 3 of 102 slices shown]
[im 34/102  soft-tissue]
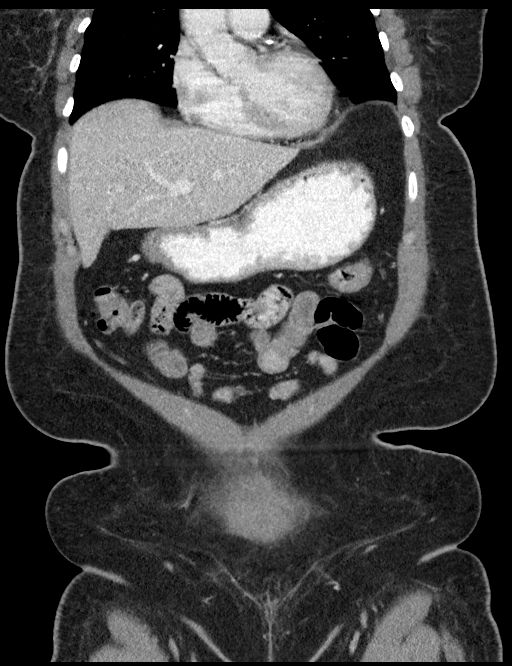
[im 45/102  soft-tissue]
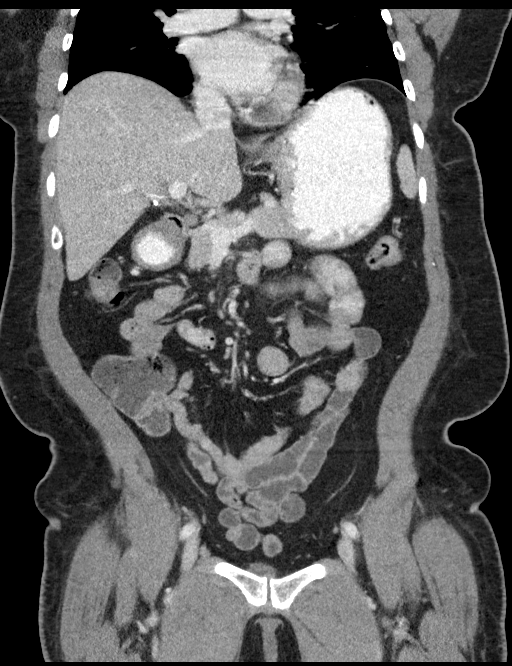
[im 57/102  soft-tissue]
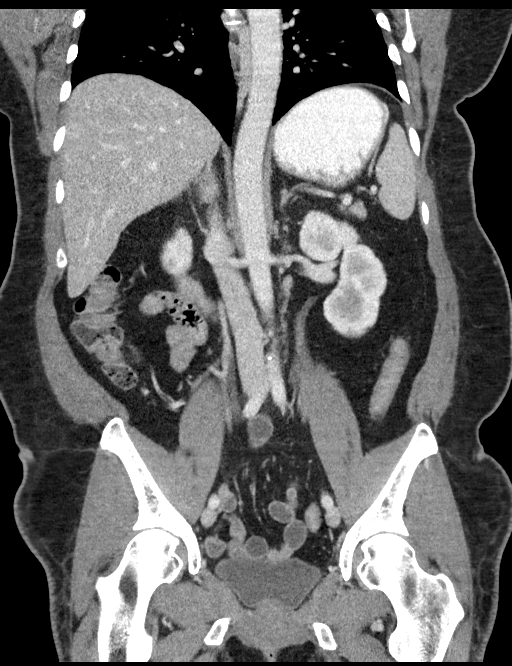

[16 of 46 positions shown; findings below may reference images not displayed]

FINDINGS: Lower chest: No pulmonary nodules, pleural effusions, or
infiltrates. Coronary artery calcifications are present. Heart size
is normal.

Hepatobiliary: Liver is normal in appearance. No focal liver
lesions. Status post cholecystectomy.

Pancreas: Normal in appearance.

Spleen: Normal in appearance.

Renal/Adrenal: Normal adrenal glands. Symmetric enhancement and
excretion from both kidneys. No focal renal mass or hydronephrosis.

Gastrointestinal tract: The stomach and small bowel loops are normal
in appearance. The appendix is well seen and has a normal
appearance. Colonic loops are normal in appearance.

Reproductive/Pelvis: Uterus is present. Bilateral tubal ligation
clips are present. No adnexal mass. No free pelvic fluid.

Vascular/Lymphatic: Mild atherosclerotic calcification of the
abdominal aorta. No retroperitoneal or mesenteric adenopathy.

Musculoskeletal/Abdominal wall: Visualized osseous structures have a
normal appearance. Abdominal wall is unremarkable.

Other: none
IMPRESSION: 1.  No evidence for acute  abnormality.
2. Normal appendix.
3. No bowel obstruction or abscess.
4. Coronary artery disease.
5.  Aortic atherosclerosis.

## 2018-01-05 ENCOUNTER — Ambulatory Visit: Payer: BLUE CROSS/BLUE SHIELD | Admitting: Family Medicine

## 2018-01-13 ENCOUNTER — Other Ambulatory Visit: Payer: Self-pay | Admitting: Emergency Medicine

## 2018-01-13 ENCOUNTER — Ambulatory Visit
Admission: RE | Admit: 2018-01-13 | Discharge: 2018-01-13 | Disposition: A | Discharge status: Home / Self Care | Payer: Worker's Compensation | Source: Ambulatory Visit | Attending: Emergency Medicine | Admitting: Emergency Medicine

## 2018-01-13 DIAGNOSIS — M25512 Pain in left shoulder: Secondary | ICD-10-CM | POA: Insufficient documentation

## 2018-01-13 DIAGNOSIS — W19XXXA Unspecified fall, initial encounter: Secondary | ICD-10-CM | POA: Insufficient documentation

## 2018-01-13 DIAGNOSIS — M25511 Pain in right shoulder: Secondary | ICD-10-CM | POA: Diagnosis present

## 2018-01-13 DIAGNOSIS — M79651 Pain in right thigh: Secondary | ICD-10-CM | POA: Diagnosis not present

## 2018-01-13 DIAGNOSIS — R52 Pain, unspecified: Secondary | ICD-10-CM

## 2018-01-13 DIAGNOSIS — M85812 Other specified disorders of bone density and structure, left shoulder: Secondary | ICD-10-CM | POA: Insufficient documentation

## 2018-01-13 DIAGNOSIS — M79652 Pain in left thigh: Secondary | ICD-10-CM | POA: Diagnosis not present

## 2018-01-28 ENCOUNTER — Encounter: Payer: Self-pay | Admitting: Family Medicine

## 2018-01-28 ENCOUNTER — Ambulatory Visit (INDEPENDENT_AMBULATORY_CARE_PROVIDER_SITE_OTHER): Payer: BLUE CROSS/BLUE SHIELD | Admitting: Family Medicine

## 2018-01-28 VITALS — BP 110/58 | HR 84 | Resp 14 | Ht 63.0 in | Wt 164.8 lb

## 2018-01-28 DIAGNOSIS — M5442 Lumbago with sciatica, left side: Secondary | ICD-10-CM

## 2018-01-28 DIAGNOSIS — K219 Gastro-esophageal reflux disease without esophagitis: Secondary | ICD-10-CM

## 2018-01-28 DIAGNOSIS — M25511 Pain in right shoulder: Secondary | ICD-10-CM

## 2018-01-28 DIAGNOSIS — E2839 Other primary ovarian failure: Secondary | ICD-10-CM | POA: Diagnosis not present

## 2018-01-28 DIAGNOSIS — G4709 Other insomnia: Secondary | ICD-10-CM

## 2018-01-28 DIAGNOSIS — E1142 Type 2 diabetes mellitus with diabetic polyneuropathy: Secondary | ICD-10-CM | POA: Diagnosis not present

## 2018-01-28 DIAGNOSIS — E785 Hyperlipidemia, unspecified: Secondary | ICD-10-CM | POA: Diagnosis not present

## 2018-01-28 DIAGNOSIS — M5441 Lumbago with sciatica, right side: Secondary | ICD-10-CM

## 2018-01-28 DIAGNOSIS — R790 Abnormal level of blood mineral: Secondary | ICD-10-CM | POA: Diagnosis not present

## 2018-01-28 DIAGNOSIS — E1122 Type 2 diabetes mellitus with diabetic chronic kidney disease: Secondary | ICD-10-CM

## 2018-01-28 DIAGNOSIS — N183 Chronic kidney disease, stage 3 unspecified: Secondary | ICD-10-CM

## 2018-01-28 DIAGNOSIS — G8929 Other chronic pain: Secondary | ICD-10-CM

## 2018-01-28 DIAGNOSIS — M25512 Pain in left shoulder: Secondary | ICD-10-CM

## 2018-01-28 DIAGNOSIS — I10 Essential (primary) hypertension: Secondary | ICD-10-CM | POA: Diagnosis not present

## 2018-01-28 DIAGNOSIS — I700 Atherosclerosis of aorta: Secondary | ICD-10-CM

## 2018-01-28 DIAGNOSIS — D573 Sickle-cell trait: Secondary | ICD-10-CM

## 2018-01-28 DIAGNOSIS — D649 Anemia, unspecified: Secondary | ICD-10-CM

## 2018-01-28 LAB — POCT GLYCOSYLATED HEMOGLOBIN (HGB A1C): Hemoglobin A1C: 6.1

## 2018-01-28 MED ORDER — TEMAZEPAM 15 MG PO CAPS: 15.0000 mg | ORAL_CAPSULE | ORAL | 2 refills | Status: DC | PRN

## 2018-01-28 MED ORDER — SEMAGLUTIDE(0.25 OR 0.5MG/DOS) 2 MG/1.5ML ~~LOC~~ SOPN: 0.5000 mg | PEN_INJECTOR | SUBCUTANEOUS | 2 refills | Status: DC

## 2018-01-28 MED ORDER — MELOXICAM 7.5 MG PO TABS: 7.5000 mg | ORAL_TABLET | Freq: Every day | ORAL | 2 refills | Status: DC

## 2018-01-28 MED ORDER — TRAMADOL HCL 50 MG PO TABS: 50.0000 mg | ORAL_TABLET | Freq: Two times a day (BID) | ORAL | 2 refills | Status: DC | PRN

## 2018-01-28 MED ORDER — MAGNESIUM OXIDE 400 MG PO CAPS: 1.0000 | ORAL_CAPSULE | Freq: Two times a day (BID) | ORAL | 5 refills | Status: DC

## 2018-01-28 NOTE — Progress Notes (Signed)
Name: Brandy Johnston   MRN: [id number]    DOB: August 03, 1961   Date:01/28/2018       Progress Note  Subjective  Chief Complaint  Chief Complaint  Patient presents with  . Hypertension  . Diabetes    HPI  DMII with renal manifestation, and neuropathy. States now finger tips and toes are better since she has been magnesium and gabapentin. She has not been checking her glucose levels at home . Denies polyphagia, polydipsia or polyuria. She is offMetformin, and is on Ozempic 0.5 mg daily , losing weight down 6 lbs since last visit. She  denies abdominal pain, but has noticed a little constipations, she thinks secondary to vitamin D, but explained likely from Ozempic.   Insomnia: She is taking Temazepam prn, wakes up at 3 am but able to fall back asleep, she states sometimes she wakes up to void. She usually wakes up feeling rested in am. No snoring   HTN: taking medication,  occasionally has palpitation ( seen cardiologist and was given Lopressor but she stopped because it did not improve symptoms) - no pain or diaphoresis or SOB associated with flutters She denies dizziness, therefore we will monitor, bp still towards low end of normal but she states she is feeling fine and does not want to decrease dose  Hyperlipidemia: reviewed labs and last LDL is at goal, continue medication   GERD: she is back on Omeprazole once daily because while on Ranitidine symptoms got worse, she is aware of long term risk of PPI use. She states symptoms are under control, no heartburn or vomiting when she takes Omeprazole  Chronic low back pain with lumbar radiculitis:no recent flares,The left outer thigh and bottom of left foot stays numb. She had NCS and MRI. She does not want to see Dr. Council Mechanic at this time or get PT because of cost, she has not started home exercises She has a different job position now, and it has helped with intensity of back pain. She states shoulder and back pain have improved since  change in position at work, however feel at work a couple of weeks ago, under worman's comp and left shoulder is very painful again. She states x-ray showed some osteopenia we will check bone density test  Low calcium and magnesium: seen by Endo still taking supplementation, we were supposed to stop Omeprazole and start Ranitidine, but unable to tolerate the switch, she has been taking it daily.   Migraine: she has episodes seldom, usually temporal, sharp, associated with photophobia, but no phonophobia. No nausea or vomiting. She takes prn Fioricet prn, still has a half bottle at home  Atherosclerosis Aorta: taking aspirin and is back on statin therapy    Patient Active Problem List   Diagnosis Date Noted  . Chronic bilateral back pain 07/26/2017  . Bilateral shoulder pain 07/26/2017  . Coronary artery calcification 05/17/2017  . Heart palpitations 05/15/2017  . History of acute gastritis   . Leukocytosis 09/30/2016  . Atherosclerosis of abdominal aorta (HCC) 08/18/2016  . Type 2 diabetes mellitus with stage 3 chronic kidney disease, without long-term current use of insulin (HCC) 12/16/2015  . Diabetic neuropathy associated with type 2 diabetes mellitus (HCC) 09/18/2015  . Insomnia 09/18/2015  . Benign essential HTN 06/18/2015  . Chronic kidney disease (CKD), stage III (moderate) (HCC) 06/18/2015  . Diabetes mellitus with renal manifestation (HCC) 06/18/2015  . Abnormal serum level of alkaline phosphatase 06/18/2015  . Bilateral low back pain with left-sided sciatica 06/18/2015  .  Obesity (BMI 30.0-34.9) 06/18/2015  . Degenerative arthritis of hip 06/18/2015  . Sickle cell trait (HCC) 06/18/2015  . Dyslipidemia 05/27/2010  . Gastro-esophageal reflux disease without esophagitis 05/25/2008    Past Surgical History:  Procedure Laterality Date  . CHOLECYSTECTOMY    . COLONOSCOPY    . ESOPHAGOGASTRODUODENOSCOPY (EGD) WITH PROPOFOL N/A 10/05/2016   Procedure:  ESOPHAGOGASTRODUODENOSCOPY (EGD) WITH PROPOFOL;  Surgeon: Midge Minium, MD;  Location[facility];  Service: Endoscopy;  Laterality: N/A;  . FRACTURE SURGERY Left    cast and pins   . TUBAL LIGATION      Family History  Problem Relation Age of Onset  . Migraines Mother   . Diabetes Mother   . Cancer Mother        lung  . Arthritis Brother   . Breast cancer Maternal Aunt   . Cancer Maternal Uncle        Lung and Colon  . Cirrhosis Brother   . Breast cancer Cousin     Social History   Socioeconomic History  . Marital status: Single    Spouse name: Not on file  . Number of children: 2  . Years of education: Not on file  . Highest education level: Not on file  Social Needs  . Financial resource strain: Not on file  . Food insecurity - worry: Not on file  . Food insecurity - inability: Not on file  . Transportation needs - medical: Not on file  . Transportation needs - non-medical: Not on file  Occupational History  . Not on file  Tobacco Use  . Smoking status: Never Smoker  . Smokeless tobacco: Never Used  Substance and Sexual Activity  . Alcohol use: Yes    Alcohol/week: 0.0 oz    Comment: rare  . Drug use: Yes    Types: Marijuana    Comment: smokes marijuana occasionally  . Sexual activity: Yes    Birth control/protection: Other-see comments    Comment: homosexual   Other Topics Concern  . 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  amLODipine-valsartan (EXFORGE) 5-160 MG tablet, Take 1 tablet by mouth daily., Disp: 30 tablet, Rfl: 5 .  aspirin 81 MG chewable tablet, Chew 1 tablet by mouth as needed., Disp: , Rfl:  .  atorvastatin (LIPITOR) 20 MG tablet, Take 1 tablet (20 mg total) by mouth daily., Disp: 30 tablet, Rfl: 5 .  Butalbital-APAP-Caffeine 50-300-40  MG CAPS, take 1 capsule by mouth every 6 hours if needed, Disp: 40 capsule, Rfl: 0 .  Cholecalciferol (VITAMIN D) 2000 units CAPS, Take 1 capsule (2,000 Units total) by mouth daily., Disp: 30 capsule, Rfl: 0 .  cyclobenzaprine (FLEXERIL) 5 MG tablet, take 1 tablet by mouth every 8 hours if needed, Disp: 90 tablet, Rfl: 2 .  gabapentin (NEURONTIN) 300 MG capsule, Take 1 capsule (300 mg total) by mouth 2 (two) times daily., Disp: 60 capsule, Rfl: 5 .  Magnesium Oxide 400 MG CAPS, Take 1 capsule (400 mg total) by mouth 2 (two) times daily., Disp: 60 capsule, Rfl: 5 .  meloxicam (MOBIC) 7.5 MG tablet, Take 1 tablet (7.5 mg total) by mouth daily., Disp: 30 tablet, Rfl: 2 .  omeprazole (PRILOSEC) 20 MG capsule, Take 1 capsule (20 mg total)  by mouth daily., Disp: 30 capsule, Rfl: 5 .  Semaglutide (OZEMPIC) 0.25 or 0.5 MG/DOSE SOPN, Inject 0.5 mg into the skin once a week., Disp: 2 pen, Rfl: 2 .  temazepam (RESTORIL) 15 MG capsule, Take 1 capsule (15 mg total) by mouth as needed., Disp: 30 capsule, Rfl: 2 .  traMADol (ULTRAM) 50 MG tablet, Take 1 tablet (50 mg total) by mouth every 12 (twelve) hours as needed. For pain, Disp: 60 tablet, Rfl: 2  No Known Allergies   ROS  Constitutional: Negative for fever, positive for  weight change.  Respiratory: Negative for cough and shortness of breath.   Cardiovascular: Negative for chest pain or palpitations.  Gastrointestinal: Negative for abdominal pain, no bowel changes.  Musculoskeletal: Negative for gait problem or joint swelling.  Skin: Negative for rash.  Neurological: Negative for dizziness or headache.  No other specific complaints in a complete review of systems (except as listed in HPI above).  Objective  Vitals:   01/28/18 0803  BP: (!) 110/58  Pulse: 84  Resp: 14  SpO2: 98%  Weight: 164 lb 12.8 oz (74.8 kg)  Height: 5\' 3"  (1.6 m)    Body mass index is 29.19 kg/m.  Physical Exam  Constitutional: Patient appears well-developed and  well-nourished. Obese  No distress.  HEENT: head atraumatic, normocephalic, pupils equal and reactive to light,  neck supple, throat within normal limits Cardiovascular: Normal rate, regular rhythm and normal heart sounds.  No murmur heard. No BLE edema. Pulmonary/Chest: Effort normal and breath sounds normal. No respiratory distress. Abdominal: Soft.  There is no tenderness. Psychiatric: Patient has a normal mood and affect. behavior is normal. Judgment and thought content normal.  Recent Results (from the past 2160 hour(s))  POCT HgB A1C     Status: Abnormal   Collection Time: 01/28/18  8:07 AM  Result Value Ref Range   Hemoglobin A1C 6.1     Diabetic Foot Exam: Diabetic Foot Exam - Simple   Simple Foot Form Diabetic Foot exam was performed with the following findings:  Yes 01/28/2018  8:34 AM  Visual Inspection See comments:  Yes Sensation Testing Intact to touch and monofilament testing bilaterally:  Yes Pulse Check Posterior Tibialis and Dorsalis pulse intact bilaterally:  Yes Comments Corn formation       PHQ2/9: Depression screen [facility] 2/9 09/16/2017 08/19/2016 07/23/2016 05/11/2016 04/21/2016  Decreased Interest 0 0 0 0 0  Down, Depressed, Hopeless 0 0 0 0 0  PHQ - 2 Score 0 0 0 0 0     Fall Risk: Fall Risk  01/28/2018 09/16/2017 08/19/2016 07/23/2016 05/11/2016  Falls in the past year? Yes No No No No  Number falls in past yr: 1 - - - -  Injury with Fall? Yes - - - -  Follow up Falls prevention discussed - - - -    worman's comp panel    Functional Status Survey: Is the patient deaf or have difficulty hearing?: No Does the patient have difficulty seeing, even when wearing glasses/contacts?: No Does the patient have difficulty concentrating, remembering, or making decisions?: No Does the patient have difficulty walking or climbing stairs?: No Does the patient have difficulty dressing or bathing?: No Does the patient have difficulty doing errands alone such as visiting  a doctor's office or shopping?: No    Assessment & Plan  1. Diabetic polyneuropathy associated with type 2 diabetes mellitus (HCC)  - POCT HgB A1C - Semaglutide (OZEMPIC) 0.25 or 0.5 MG/DOSE SOPN; Inject 0.5 mg  into the skin once a week.  Dispense: 2 pen; Refill: 2  2. Gastro-esophageal reflux disease without esophagitis   3. Chronic bilateral low back pain with left-sided sciatica   4. Low magnesium level  - Magnesium Oxide 400 MG CAPS; Take 1 capsule (400 mg total) by mouth 2 (two) times daily.  Dispense: 60 capsule; Refill: 5  5. Sickle cell trait (HCC)   6. Atherosclerosis of abdominal aorta (HCC)  Continue statin therapy and aspirin   7. Dyslipidemia   8. Benign essential HTN  - CBC with Differential/Platelet - Comprehensive metabolic panel  9. Anemia, unspecified type  - CBC with Differential/Platelet - Iron, TIBC and Ferritin Panel; Future Negative hemoccult stools x 3  10. Ovarian failure  - DG Bone Density; Future  11. Chronic bilateral low back pain with bilateral sciatica  - meloxicam (MOBIC) 7.5 MG tablet; Take 1 tablet (7.5 mg total) by mouth daily.  Dispense: 30 tablet; Refill: 2 - traMADol (ULTRAM) 50 MG tablet; Take 1 tablet (50 mg total) by mouth every 12 (twelve) hours as needed. For pain  Dispense: 60 tablet; Refill: 2  12. Chronic pain of both shoulders  - meloxicam (MOBIC) 7.5 MG tablet; Take 1 tablet (7.5 mg total) by mouth daily.  Dispense: 30 tablet; Refill: 2 - traMADol (ULTRAM) 50 MG tablet; Take 1 tablet (50 mg total) by mouth every 12 (twelve) hours as needed. For pain  Dispense: 60 tablet; Refill: 2  13. Type 2 diabetes mellitus with stage 3 chronic kidney disease, without long-term current use of insulin (HCC)  - Semaglutide (OZEMPIC) 0.25 or 0.5 MG/DOSE SOPN; Inject 0.5 mg into the skin once a week.  Dispense: 2 pen; Refill: 2  14. Other insomnia  - temazepam (RESTORIL) 15 MG capsule; Take 1 capsule (15 mg total) by mouth as  needed.  Dispense: 30 capsule; Refill: 2

## 2018-01-31 LAB — CBC WITH DIFFERENTIAL/PLATELET
Basophils Absolute: 56 cells/uL (ref 0–200)
Basophils Relative: 0.6 %
Eosinophils Absolute: 326 cells/uL (ref 15–500)
Eosinophils Relative: 3.5 %
HCT: 34.3 % — ABNORMAL LOW (ref 35.0–45.0)
Hemoglobin: 11.5 g/dL — ABNORMAL LOW (ref 11.7–15.5)
Lymphs Abs: 2985 cells/uL (ref 850–3900)
MCH: 26.6 pg — ABNORMAL LOW (ref 27.0–33.0)
MCHC: 33.5 g/dL (ref 32.0–36.0)
MCV: 79.2 fL — ABNORMAL LOW (ref 80.0–100.0)
MPV: 11.5 fL (ref 7.5–12.5)
Monocytes Relative: 5.8 %
Neutro Abs: 5394 cells/uL (ref 1500–7800)
Neutrophils Relative %: 58 %
Platelets: 243 10*3/uL (ref 140–400)
RBC: 4.33 10*6/uL (ref 3.80–5.10)
RDW: 14 % (ref 11.0–15.0)
Total Lymphocyte: 32.1 %
WBC mixed population: 539 cells/uL (ref 200–950)
WBC: 9.3 10*3/uL (ref 3.8–10.8)

## 2018-01-31 LAB — COMPREHENSIVE METABOLIC PANEL
AG Ratio: 1.3 (calc) (ref 1.0–2.5)
ALT: 19 U/L (ref 6–29)
AST: 20 U/L (ref 10–35)
Albumin: 4.1 g/dL (ref 3.6–5.1)
Alkaline phosphatase (APISO): 147 U/L — ABNORMAL HIGH (ref 33–130)
BUN/Creatinine Ratio: 13 (calc) (ref 6–22)
BUN: 14 mg/dL (ref 7–25)
CO2: 29 mmol/L (ref 20–32)
Calcium: 9.1 mg/dL (ref 8.6–10.4)
Chloride: 105 mmol/L (ref 98–110)
Creat: 1.06 mg/dL — ABNORMAL HIGH (ref 0.50–1.05)
Globulin: 3.2 g/dL (calc) (ref 1.9–3.7)
Glucose, Bld: 88 mg/dL (ref 65–99)
Potassium: 4 mmol/L (ref 3.5–5.3)
Sodium: 141 mmol/L (ref 135–146)
Total Bilirubin: 0.5 mg/dL (ref 0.2–1.2)
Total Protein: 7.3 g/dL (ref 6.1–8.1)

## 2018-01-31 LAB — TEST AUTHORIZATION

## 2018-01-31 LAB — IRON,TIBC AND FERRITIN PANEL
%SAT: 25 % (calc) (ref 11–50)
Ferritin: 88 ng/mL (ref 10–232)
Iron: 63 ug/dL (ref 45–160)
TIBC: 256 mcg/dL (calc) (ref 250–450)

## 2018-02-12 ENCOUNTER — Other Ambulatory Visit: Payer: Self-pay | Admitting: Family Medicine

## 2018-03-04 ENCOUNTER — Telehealth: Payer: Self-pay | Admitting: Family Medicine

## 2018-03-04 NOTE — Telephone Encounter (Signed)
Copied from CRM [phone]. Topic: Quick Communication - Rx Refill/Question >> Mar 04, 2018  9:29 AM Brandy Johnston, Brandy Johnston wrote: Medication:  something for constipation  Pt states she mentioned at last visit 01/28/18 that she may need something to help with constipation.  Thought may have been iron tablets, but advised that was probably not the reason. Pt would like to know if Dr Carlynn PurlSowles will send her something for constipation. Pt states it has been about 4 days since she had a bm.  [facility] [address] [place] - [id number] [phone] (Phone) [phone] (Fax)

## 2018-03-04 NOTE — Telephone Encounter (Signed)
Unfortunately it is not documented on her note. I would recommend otc Miralax one pack twice daily, drink more water and increase fiber intake. If no improvement, vomiting, abdominal cramping or blood in stools [facility]

## 2018-03-07 NOTE — Telephone Encounter (Signed)
Patient informed. 

## 2018-05-03 ENCOUNTER — Encounter: Payer: Self-pay | Admitting: Family Medicine

## 2018-05-03 ENCOUNTER — Ambulatory Visit: Payer: BLUE CROSS/BLUE SHIELD | Admitting: Family Medicine

## 2018-05-03 ENCOUNTER — Other Ambulatory Visit: Payer: Self-pay | Admitting: Family Medicine

## 2018-05-03 VITALS — BP 106/58 | HR 85 | Temp 98.3°F | Resp 16 | Ht 63.0 in | Wt 160.0 lb

## 2018-05-03 DIAGNOSIS — I700 Atherosclerosis of aorta: Secondary | ICD-10-CM | POA: Diagnosis not present

## 2018-05-03 DIAGNOSIS — E1142 Type 2 diabetes mellitus with diabetic polyneuropathy: Secondary | ICD-10-CM | POA: Diagnosis not present

## 2018-05-03 DIAGNOSIS — M25512 Pain in left shoulder: Secondary | ICD-10-CM

## 2018-05-03 DIAGNOSIS — K219 Gastro-esophageal reflux disease without esophagitis: Secondary | ICD-10-CM

## 2018-05-03 DIAGNOSIS — M5442 Lumbago with sciatica, left side: Secondary | ICD-10-CM | POA: Diagnosis not present

## 2018-05-03 DIAGNOSIS — E1122 Type 2 diabetes mellitus with diabetic chronic kidney disease: Secondary | ICD-10-CM | POA: Diagnosis not present

## 2018-05-03 DIAGNOSIS — D573 Sickle-cell trait: Secondary | ICD-10-CM | POA: Diagnosis not present

## 2018-05-03 DIAGNOSIS — E785 Hyperlipidemia, unspecified: Secondary | ICD-10-CM | POA: Diagnosis not present

## 2018-05-03 DIAGNOSIS — G8929 Other chronic pain: Secondary | ICD-10-CM

## 2018-05-03 DIAGNOSIS — I10 Essential (primary) hypertension: Secondary | ICD-10-CM | POA: Diagnosis not present

## 2018-05-03 DIAGNOSIS — M25511 Pain in right shoulder: Secondary | ICD-10-CM

## 2018-05-03 DIAGNOSIS — M5441 Lumbago with sciatica, right side: Secondary | ICD-10-CM

## 2018-05-03 DIAGNOSIS — G44209 Tension-type headache, unspecified, not intractable: Secondary | ICD-10-CM

## 2018-05-03 DIAGNOSIS — G4709 Other insomnia: Secondary | ICD-10-CM | POA: Diagnosis not present

## 2018-05-03 DIAGNOSIS — N183 Chronic kidney disease, stage 3 unspecified: Secondary | ICD-10-CM

## 2018-05-03 LAB — POCT GLYCOSYLATED HEMOGLOBIN (HGB A1C): Hemoglobin A1C: 6.5

## 2018-05-03 MED ORDER — TRAMADOL HCL 50 MG PO TABS: 50.0000 mg | ORAL_TABLET | Freq: Two times a day (BID) | ORAL | 2 refills | Status: DC | PRN

## 2018-05-03 MED ORDER — ATORVASTATIN CALCIUM 20 MG PO TABS: 20.0000 mg | ORAL_TABLET | Freq: Every day | ORAL | 5 refills | Status: DC

## 2018-05-03 MED ORDER — GABAPENTIN 300 MG PO CAPS
300.0000 mg | ORAL_CAPSULE | Freq: Two times a day (BID) | ORAL | 5 refills | Status: DC
Start: 2018-05-03 — End: 2018-08-14

## 2018-05-03 MED ORDER — CYCLOBENZAPRINE HCL 5 MG PO TABS: 5.0000 mg | ORAL_TABLET | Freq: Two times a day (BID) | ORAL | 2 refills | Status: DC

## 2018-05-03 MED ORDER — ASPIRIN 81 MG PO CHEW: 81.0000 mg | CHEWABLE_TABLET | Freq: Every day | ORAL | 0 refills | Status: AC

## 2018-05-03 MED ORDER — SEMAGLUTIDE(0.25 OR 0.5MG/DOS) 2 MG/1.5ML ~~LOC~~ SOPN: 0.5000 mg | PEN_INJECTOR | SUBCUTANEOUS | 2 refills | Status: DC

## 2018-05-03 MED ORDER — BUTALBITAL-APAP-CAFFEINE 50-300-40 MG PO CAPS: 1.0000 | ORAL_CAPSULE | ORAL | 0 refills | Status: DC | PRN

## 2018-05-03 MED ORDER — AMLODIPINE BESYLATE-VALSARTAN 5-160 MG PO TABS: 1.0000 | ORAL_TABLET | Freq: Every day | ORAL | 5 refills | Status: DC

## 2018-05-03 MED ORDER — TEMAZEPAM 15 MG PO CAPS
15.0000 mg | ORAL_CAPSULE | ORAL | 2 refills | Status: DC | PRN
Start: 2018-05-03 — End: 2018-09-28

## 2018-05-03 MED ORDER — OMEPRAZOLE 20 MG PO CPDR: 20.0000 mg | DELAYED_RELEASE_CAPSULE | Freq: Every day | ORAL | 5 refills | Status: DC

## 2018-05-03 NOTE — Progress Notes (Signed)
Name: Brandy Johnston   MRN: [id number]    DOB: [birth year]   Date:05/03/2018       Progress Note  Subjective  Chief Complaint  Chief Complaint  Patient presents with  . Medication Refill    3 month F/U  . Diabetes  . Hypertension    Headaches slightly  . Hyperlipidemia  . Gastroesophageal Reflux    Well controlled with daily pills    HPI  DMII with renal manifestation, and neuropathy. States now finger tips and toes are better since she has been magnesium and gabapentin. She has not been checking her glucose levels at home . Denies polyphagia, polydipsia or polyuria. She is offMetformin, and is on Ozempic 0.5 mg daily, sometimes goes down to 0.25 mg weekly  , losing weight down 11 lbs since last visit. She  denies abdominal pain, but has noticed a little constipations, she thinks secondary to vitamin D, but explained likely from Ozempic.   Insomnia: She is taking Temazepam prn, wakes up at 3 am but able to fall back asleep, she states sometimes she wakes up to void. She usually wakes up feeling rested in am. No snoring, she has pain and it may be the reason from being restless.   HTN: taking medication, occasionally has palpitation ( seen cardiologist and was given Lopressor but she stopped because it did not improve symptoms) - no pain or diaphoresis or SOB associated with flutters She denies dizziness, therefore we will monitor, bp still towards low end of normal but at times still spikes and does not want to change medication   Hyperlipidemia:reviewed labs and last LDL is at goal, continue medication, recheck next visit   GERD: she is back on Omeprazole once daily because while on Ranitidine symptoms got worse, she is aware of long term risk of PPI use. Unchanged   Chronic low back pain with lumbar radiculitis:no recent flares,The left outer thigh and bottom of left foot stays numb. She had NCS and MRI. She does not want to see Dr. Council Mechanic at this time or get PT because  of cost, she has not started home exercises She has a different job position now, and it has helped with intensity of back pain. She states shoulder and back pain have improved since change in position at work, however feel at work 12/2017, under worman's comp and left shoulder and is getting PT and may need surgery, she is currently on modified duty at work max of 5 lbs.   Low calcium and magnesium: seen by Endo still taking supplementation, we were supposed to stop Omeprazole and start Ranitidine, but unable to tolerate the switch, she has been taking it daily. Unchanged.   Migraine/Tension headaches: she has episodes seldom, usually temporal, sharp, associated with photophobia, but no phonophobia. No nausea or vomiting. She takes prn Fioricet prn, still has a half bottle at home. Needs refill of medication   Atherosclerosis Aorta: taking aspirin and is back on statin therapy, stable  Patient Active Problem List   Diagnosis Date Noted  . Chronic bilateral back pain 07/26/2017  . Bilateral shoulder pain 07/26/2017  . Coronary artery calcification 05/17/2017  . Heart palpitations 05/15/2017  . History of acute gastritis   . Leukocytosis 09/30/2016  . Atherosclerosis of abdominal aorta (HCC) 08/18/2016  . Type 2 diabetes mellitus with stage 3 chronic kidney disease, without long-term current use of insulin (HCC) 12/16/2015  . Diabetic neuropathy associated with type 2 diabetes mellitus (HCC) 09/18/2015  . Insomnia 09/18/2015  .  Benign essential HTN 06/18/2015  . Chronic kidney disease (CKD), stage III (moderate) (HCC) 06/18/2015  . Diabetes mellitus with renal manifestation (HCC) 06/18/2015  . Abnormal serum level of alkaline phosphatase 06/18/2015  . Bilateral low back pain with left-sided sciatica 06/18/2015  . Obesity (BMI 30.0-34.9) 06/18/2015  . Degenerative arthritis of hip 06/18/2015  . Sickle cell trait (HCC) 06/18/2015  . Dyslipidemia 05/27/2010  . Gastro-esophageal reflux  disease without esophagitis 05/25/2008    Past Surgical History:  Procedure Laterality Date  . CHOLECYSTECTOMY    . COLONOSCOPY    . ESOPHAGOGASTRODUODENOSCOPY (EGD) WITH PROPOFOL N/A 10/05/2016   Procedure: ESOPHAGOGASTRODUODENOSCOPY (EGD) WITH PROPOFOL;  Surgeon: Midge Minium, MD;  Location: [facility];  Service: Endoscopy;  Laterality: N/A;  . FRACTURE SURGERY Left    cast and pins   . TUBAL LIGATION      Family History  Problem Relation Age of Onset  . Migraines Mother   . Diabetes Mother   . Cancer Mother        lung  . Arthritis Brother   . Breast cancer Maternal Aunt   . Cancer Maternal Uncle        Lung and Colon  . Cirrhosis Brother   . Breast cancer Cousin     Social History   Socioeconomic History  . Marital status: Single    Spouse name: Not on file  . Number of children: 2  . Years of education: Not on file  . Highest education level: Not on file  Occupational History  . Not on file  Social Needs  . Financial resource strain: Not on file  . Food insecurity:    Worry: Not on file    Inability: Not on file  . Transportation needs:    Medical: Not on file    Non-medical: Not on file  Tobacco Use  . Smoking status: Never Smoker  . Smokeless tobacco: Never Used  Substance and Sexual Activity  . Alcohol use: Yes    Alcohol/week: 0.0 oz    Comment: rare  . Drug use: Yes    Types: Marijuana    Comment: smokes marijuana occasionally  . Sexual activity: Yes    Birth control/protection: Other-see comments    Comment: homosexual   Lifestyle  . Physical activity:    Days per week: Not on file    Minutes per session: Not on file  . Stress: Not on file  Relationships  . Social connections:    Talks on phone: Not on file    Gets together: Not on file    Attends religious service: Not on file    Active member of club or organization: Not on file    Attends meetings of clubs or organizations: Not on file    Relationship status: Not on file  .  Intimate partner violence:    Fear of current or ex partner: Not on file    Emotionally abused: Not on file    Physically abused: Not on file    Forced sexual activity: Not on file  Other Topics Concern  . 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  amLODipine-valsartan (EXFORGE) 5-160 MG tablet, Take 1 tablet by mouth daily., Disp: 30 tablet, Rfl: 5 .  atorvastatin (LIPITOR) 20  MG tablet, Take 1 tablet (20 mg total) by mouth daily., Disp: 30 tablet, Rfl: 5 .  Butalbital-APAP-Caffeine 50-300-40 MG CAPS, Take 1 capsule by mouth every 4 (four) hours as needed., Disp: 40 capsule, Rfl: 0 .  Cholecalciferol (VITAMIN D) 2000 units CAPS, Take 1 capsule (2,000 Units total) by mouth daily., Disp: 30 capsule, Rfl: 0 .  cyclobenzaprine (FLEXERIL) 5 MG tablet, Take 1-2 tablets (5-10 mg total) by mouth 2 (two) times daily. One in am and 2 in pm, Disp: 90 tablet, Rfl: 2 .  gabapentin (NEURONTIN) 300 MG capsule, Take 1 capsule (300 mg total) by mouth 2 (two) times daily., Disp: 60 capsule, Rfl: 5 .  Magnesium Oxide 400 MG CAPS, Take 1 capsule (400 mg total) by mouth 2 (two) times daily., Disp: 60 capsule, Rfl: 5 .  meloxicam (MOBIC) 7.5 MG tablet, Take 1 tablet (7.5 mg total) by mouth daily., Disp: 30 tablet, Rfl: 2 .  omeprazole (PRILOSEC) 20 MG capsule, Take 1 capsule (20 mg total) by mouth daily., Disp: 30 capsule, Rfl: 5 .  Semaglutide (OZEMPIC) 0.25 or 0.5 MG/DOSE SOPN, Inject 0.5 mg into the skin once a week., Disp: 2 pen, Rfl: 2 .  temazepam (RESTORIL) 15 MG capsule, Take 1 capsule (15 mg total) by mouth as needed., Disp: 30 capsule, Rfl: 2 .  traMADol (ULTRAM) 50 MG tablet, Take 1 tablet (50 mg total) by mouth every 12 (twelve) hours as needed. For pain, Disp: 60 tablet, Rfl: 2 .  aspirin (ASPIRIN 81) 81 MG  chewable tablet, Chew 1 tablet (81 mg total) by mouth daily., Disp: 30 tablet, Rfl: 0  No Known Allergies   ROS  Constitutional: Negative for fever , positive for weight change.  Respiratory: Negative for cough and shortness of breath.   Cardiovascular: Negative for chest pain or palpitations.  Gastrointestinal: Negative for abdominal pain, no bowel changes.  Musculoskeletal: Negative for gait problem or joint swelling.  Skin: Negative for rash.  Neurological: Negative for dizziness, positive for intermittent headache.  No other specific complaints in a complete review of systems (except as listed in HPI above).  Objective  Vitals:   05/03/18 0918  BP: (!) 106/58  Pulse: 85  Resp: 16  Temp: 98.3 F (36.8 C)  TempSrc: Oral  SpO2: 96%  Weight: 160 lb (72.6 kg)  Height:  (1.6 m)    Body mass index is 28.34 kg/m.  Physical Exam  Constitutional: Patient appears well-developed and well-nourished. Obese No distress.  HEENT: head atraumatic, normocephalic, pupils equal and reactive to light,neck supple, throat within normal limits Cardiovascular: Normal rate, regular rhythm and normal heart sounds.  No murmur heard. No BLE edema. Pulmonary/Chest: Effort normal and breath sounds normal. No respiratory distress. Abdominal: Soft.  There is no tenderness. Psychiatric: Patient has a normal mood and affect. behavior is normal. Judgment and thought content normal. Muscular Skeletal: negative straight leg raise, decrease abduction of left shoulder, no  pain during palpation of lumbar spine  Recent Results (from the past 2160 hour(s))  POCT HgB A1C     Status: None   Collection Time: 05/03/18  9:31 AM  Result Value Ref Range   Hemoglobin A1C 6.5     PHQ2/9: Depression screen [facility] 2/9 05/03/2018 09/16/2017 08/19/2016 07/23/2016 05/11/2016  Decreased Interest 0 0 0 0 0  Down, Depressed, Hopeless 0 0 0 0 0  PHQ - 2 Score 0 0 0 0 0    Fall Risk: Fall Risk  05/03/2018 01/28/2018  09/16/2017 08/19/2016  07/23/2016  Falls in the past year? Yes Yes No No No  Comment Tripped at work - - - -  Number falls in past yr: 1 1 - - -  Injury with Fall? Yes Yes - - -  Follow up - Falls prevention discussed - - -     Functional Status Survey: Is the patient deaf or have difficulty hearing?: No Does the patient have difficulty seeing, even when wearing glasses/contacts?: No Does the patient have difficulty concentrating, remembering, or making decisions?: No Does the patient have difficulty walking or climbing stairs?: No Does the patient have difficulty dressing or bathing?: No Does the patient have difficulty doing errands alone such as visiting a doctor's office or shopping?: No    Assessment & Plan  1. Diabetic polyneuropathy associated with type 2 diabetes mellitus (HCC)  - POCT HgB A1C - aspirin (ASPIRIN 81) 81 MG chewable tablet; Chew 1 tablet (81 mg total) by mouth daily.  Dispense: 30 tablet; Refill: 0 - gabapentin (NEURONTIN) 300 MG capsule; Take 1 capsule (300 mg total) by mouth 2 (two) times daily.  Dispense: 60 capsule; Refill: 5 - Semaglutide (OZEMPIC) 0.25 or 0.5 MG/DOSE SOPN; Inject 0.5 mg into the skin once a week.  Dispense: 2 pen; Refill: 2  2. Dyslipidemia  - atorvastatin (LIPITOR) 20 MG tablet; Take 1 tablet (20 mg total) by mouth daily.  Dispense: 30 tablet; Refill: 5  3. Chronic bilateral low back pain with left-sided sciatica  - gabapentin (NEURONTIN) 300 MG capsule; Take 1 capsule (300 mg total) by mouth 2 (two) times daily.  Dispense: 60 capsule; Refill: 5  4. Chronic bilateral low back pain with bilateral sciatica  - cyclobenzaprine (FLEXERIL) 5 MG tablet; Take 1-2 tablets (5-10 mg total) by mouth 2 (two) times daily. One in am and 2 in pm  Dispense: 90 tablet; Refill: 2 - traMADol (ULTRAM) 50 MG tablet; Take 1 tablet (50 mg total) by mouth every 12 (twelve) hours as needed. For pain  Dispense: 60 tablet; Refill: 2  5. Chronic pain of both  shoulders  - traMADol (ULTRAM) 50 MG tablet; Take 1 tablet (50 mg total) by mouth every 12 (twelve) hours as needed. For pain  Dispense: 60 tablet; Refill: 2  6. Gastro-esophageal reflux disease without esophagitis  - omeprazole (PRILOSEC) 20 MG capsule; Take 1 capsule (20 mg total) by mouth daily.  Dispense: 30 capsule; Refill: 5  7. Type 2 diabetes mellitus with stage 3 chronic kidney disease, without long-term current use of insulin (HCC)  - Semaglutide (OZEMPIC) 0.25 or 0.5 MG/DOSE SOPN; Inject 0.5 mg into the skin once a week.  Dispense: 2 pen; Refill: 2  8. Other insomnia  - temazepam (RESTORIL) 15 MG capsule; Take 1 capsule (15 mg total) by mouth as needed.  Dispense: 30 capsule; Refill: 2  9. Atherosclerosis of abdominal aorta (HCC)  - atorvastatin (LIPITOR) 20 MG tablet; Take 1 tablet (20 mg total) by mouth daily.  Dispense: 30 tablet; Refill: 5  10. Sickle cell trait (HCC)   11. Benign essential HTN  - amLODipine-valsartan (EXFORGE) 5-160 MG tablet; Take 1 tablet by mouth daily.  Dispense: 30 tablet; Refill: 5  12. Tension headache  - Butalbital-APAP-Caffeine 50-300-40 MG CAPS; Take 1 capsule by mouth every 4 (four) hours as needed.  Dispense: 40 capsule; Refill: 0

## 2018-05-05 ENCOUNTER — Encounter: Payer: Self-pay | Admitting: Family Medicine

## 2018-05-06 ENCOUNTER — Encounter: Payer: Self-pay | Admitting: Family Medicine

## 2018-05-31 DIAGNOSIS — M7540 Impingement syndrome of unspecified shoulder: Secondary | ICD-10-CM | POA: Insufficient documentation

## 2018-06-10 HISTORY — PX: OTHER SURGICAL HISTORY: SHX169

## 2018-06-17 DIAGNOSIS — Z9889 Other specified postprocedural states: Secondary | ICD-10-CM | POA: Insufficient documentation

## 2018-08-04 ENCOUNTER — Encounter: Payer: Self-pay | Admitting: Family Medicine

## 2018-08-04 ENCOUNTER — Ambulatory Visit: Payer: BLUE CROSS/BLUE SHIELD | Admitting: Family Medicine

## 2018-08-04 VITALS — BP 108/64 | HR 80 | Temp 98.2°F | Resp 16 | Ht 63.0 in | Wt 156.6 lb

## 2018-08-04 DIAGNOSIS — N183 Chronic kidney disease, stage 3 unspecified: Secondary | ICD-10-CM

## 2018-08-04 DIAGNOSIS — R112 Nausea with vomiting, unspecified: Secondary | ICD-10-CM

## 2018-08-04 DIAGNOSIS — M5442 Lumbago with sciatica, left side: Secondary | ICD-10-CM

## 2018-08-04 DIAGNOSIS — E1122 Type 2 diabetes mellitus with diabetic chronic kidney disease: Secondary | ICD-10-CM

## 2018-08-04 DIAGNOSIS — M5441 Lumbago with sciatica, right side: Secondary | ICD-10-CM

## 2018-08-04 DIAGNOSIS — I10 Essential (primary) hypertension: Secondary | ICD-10-CM

## 2018-08-04 DIAGNOSIS — G44209 Tension-type headache, unspecified, not intractable: Secondary | ICD-10-CM

## 2018-08-04 DIAGNOSIS — G8929 Other chronic pain: Secondary | ICD-10-CM

## 2018-08-04 DIAGNOSIS — Z9889 Other specified postprocedural states: Secondary | ICD-10-CM

## 2018-08-04 DIAGNOSIS — I700 Atherosclerosis of aorta: Secondary | ICD-10-CM

## 2018-08-04 DIAGNOSIS — G4709 Other insomnia: Secondary | ICD-10-CM

## 2018-08-04 DIAGNOSIS — E785 Hyperlipidemia, unspecified: Secondary | ICD-10-CM | POA: Diagnosis not present

## 2018-08-04 LAB — POCT GLYCOSYLATED HEMOGLOBIN (HGB A1C): HbA1c, POC (controlled diabetic range): 5.7 % (ref 0.0–7.0)

## 2018-08-04 MED ORDER — VALSARTAN 320 MG PO TABS: 320.0000 mg | ORAL_TABLET | Freq: Every day | ORAL | 0 refills | Status: DC

## 2018-08-04 MED ORDER — CYCLOBENZAPRINE HCL 5 MG PO TABS: 5.0000 mg | ORAL_TABLET | Freq: Two times a day (BID) | ORAL | 2 refills | Status: DC

## 2018-08-04 MED ORDER — PROMETHAZINE HCL 12.5 MG PO TABS: 12.5000 mg | ORAL_TABLET | Freq: Three times a day (TID) | ORAL | 0 refills | Status: DC | PRN

## 2018-08-04 NOTE — Progress Notes (Signed)
Name: Brandy CheeksLinda D Johnston   MRN: [id number]    DOB: [birth year]   Date:08/04/2018       Progress Note  Subjective  Chief Complaint  Chief Complaint  Patient presents with  . Medication Refill  . Diabetes    Patient states her taste buds have been off and pharmacist said it could be from Ozempic  . Hyperlipidemia  . Hypertension    Denies any symptoms  . Gastroesophageal Reflux  . Back Pain  . Nausea    HPI    DMII with renal manifestation, and neuropathy. States now finger tips and toes are better since she has been magnesium and gabapentin. She has not been checking her glucose levels at home . Denies polyphagia, polydipsia or polyuria. She is offMetformin, and is on Ozempic 0.5 mg daily, losing weight down 4 lbs since last visit. Shedenies abdominal pain, constipation resolved, however has been noticing indigestion and nausea and vomiting, sometimes food tastes different. She states she has vomiting intermittently, we will try holding Ozempic and see if symptoms resolves, otherwise needs another follow up for re-evaluation  Insomnia: She is taking Temazepam prn, not working, going to bed very late, poor sleeping hygiene at this time  HTN: taking medication,occasionally has palpitation ( seen cardiologist and was given Lopressor but she stopped because it did not improve symptoms) - no pain or diaphoresis or SOB associated with flutters She denies dizziness but we will try stopping Exforge and changing to diovan 320 mg daily   Hyperlipidemia:reviewed labs and last LDL is at goal, continue medication, she wants to recheck during her CPE   GERD: she is back on Omeprazole but states not compliant with diet, had smoked sausage/red hot last night and woke up vomiting this am. No epigastric pain, advised to also take naproxen with food, we will give her information about GERD food.   Chronic low back pain with lumbar radiculitis:no recent flares,The left outer thigh and bottom of  left foot stays numb. She had NCS and MRI. She does not want to see Dr. Council Mechanichasniss at this time or get PT because of cost, she has not started home exercises She has a different job position now, and it has helped with intensity of back pain. She states shoulder and back pain have improved since change in position at work, however feel at work 12/2017, under worman's comp and left shoulder and is getting PT and may need surgery, she is not working at this time, since shoulder surgery 05/2018  Low calcium and magnesium: seen by Endo still taking supplementation, we were supposed to stop Omeprazole and start Ranitidine, but unable to tolerate the switch, she has been taking it daily. Unchanged . She is taking supplementation   Migraine/Tension headaches: she has episodes seldom, usually temporal, sharp, associated with photophobia, but no phonophobia. No nausea or vomiting. She takes prn Fioricet prn, still has a half bottle at home. Not as frequent now and is doing well.   Atherosclerosis Aorta: taking aspirin and is back on statin therapy, stable  Patient Active Problem List   Diagnosis Date Noted  . Chronic bilateral back pain 07/26/2017  . Bilateral shoulder pain 07/26/2017  . Coronary artery calcification 05/17/2017  . Heart palpitations 05/15/2017  . History of acute gastritis   . Leukocytosis 09/30/2016  . Atherosclerosis of abdominal aorta (HCC) 08/18/2016  . Type 2 diabetes mellitus with stage 3 chronic kidney disease, without long-term current use of insulin (HCC) 12/16/2015  . Diabetic neuropathy associated with  type 2 diabetes mellitus (HCC) 09/18/2015  . Insomnia 09/18/2015  . Benign essential HTN 06/18/2015  . Chronic kidney disease (CKD), stage III (moderate) (HCC) 06/18/2015  . Diabetes mellitus with renal manifestation (HCC) 06/18/2015  . Abnormal serum level of alkaline phosphatase 06/18/2015  . Bilateral low back pain with left-sided sciatica 06/18/2015  . Obesity (BMI  30.0-34.9) 06/18/2015  . Degenerative arthritis of hip 06/18/2015  . Sickle cell trait (HCC) 06/18/2015  . Dyslipidemia 05/27/2010  . Gastro-esophageal reflux disease without esophagitis 05/25/2008    Past Surgical History:  Procedure Laterality Date  . CHOLECYSTECTOMY    . COLONOSCOPY    . ESOPHAGOGASTRODUODENOSCOPY (EGD) WITH PROPOFOL N/A 10/05/2016   Procedure: ESOPHAGOGASTRODUODENOSCOPY (EGD) WITH PROPOFOL;  Surgeon: Midge Minium, MD;  Location: [facility];  Service: Endoscopy;  Laterality: N/A;  . FRACTURE SURGERY Left    cast and pins   . TUBAL LIGATION      Family History  Problem Relation Age of Onset  . Migraines Mother   . Diabetes Mother   . Cancer Mother        lung  . Arthritis Brother   . Breast cancer Maternal Aunt   . Cancer Maternal Uncle        Lung and Colon  . Cirrhosis Brother   . Breast cancer Cousin     Social History   Socioeconomic History  . Marital status: Single    Spouse name: Not on file  . Number of children: 2  . Years of education: Not on file  . Highest education level: Not on file  Occupational History  . Not on file  Social Needs  . Financial resource strain: Not on file  . Food insecurity:    Worry: Not on file    Inability: Not on file  . Transportation needs:    Medical: Not on file    Non-medical: Not on file  Tobacco Use  . Smoking status: Never Smoker  . Smokeless tobacco: Never Used  Substance and Sexual Activity  . Alcohol use: Yes    Alcohol/week: 0.0 standard drinks    Comment: rare  . Drug use: Yes    Types: Marijuana    Comment: smokes marijuana occasionally  . Sexual activity: Yes    Birth control/protection: Other-see comments    Comment: homosexual   Lifestyle  . Physical activity:    Days per week: Not on file    Minutes per session: Not on file  . Stress: Not on file  Relationships  . Social connections:    Talks on phone: Not on file    Gets together: Not on file    Attends religious  service: Not on file    Active member of club or organization: Not on file    Attends meetings of clubs or organizations: Not on file    Relationship status: Not on file  . Intimate partner violence:    Fear of current or ex partner: Not on file    Emotionally abused: Not on file    Physically abused: Not on file    Forced sexual activity: Not on file  Other Topics Concern  . 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  aspirin (ASPIRIN 81) 81 MG chewable tablet, Chew  1 tablet (81 mg total) by mouth daily., Disp: 30 tablet, Rfl: 0 .  atorvastatin (LIPITOR) 20 MG tablet, Take 1 tablet (20 mg total) by mouth daily., Disp: 30 tablet, Rfl: 5 .  Butalbital-APAP-Caffeine 50-300-40 MG CAPS, Take 1 capsule by mouth every 4 (four) hours as needed., Disp: 40 capsule, Rfl: 0 .  Cholecalciferol (VITAMIN D) 2000 units CAPS, Take 1 capsule (2,000 Units total) by mouth daily., Disp: 30 capsule, Rfl: 0 .  cyclobenzaprine (FLEXERIL) 5 MG tablet, Take 1-2 tablets (5-10 mg total) by mouth 2 (two) times daily. One in am and 2 in pm, Disp: 90 tablet, Rfl: 2 .  gabapentin (NEURONTIN) 300 MG capsule, Take 1 capsule (300 mg total) by mouth 2 (two) times daily., Disp: 60 capsule, Rfl: 5 .  Magnesium Oxide 400 MG CAPS, Take 1 capsule (400 mg total) by mouth 2 (two) times daily., Disp: 60 capsule, Rfl: 5 .  naproxen sodium (ANAPROX) 550 MG tablet, take 1 tablet by mouth twice a day if needed, Disp: , Rfl: 0 .  omeprazole (PRILOSEC) 20 MG capsule, Take 1 capsule (20 mg total) by mouth daily., Disp: 30 capsule, Rfl: 5 .  RA COL-RITE 100 MG capsule, Take 100 mg by mouth 2 (two) times daily., Disp: , Rfl: 0 .  Semaglutide (OZEMPIC) 0.25 or 0.5 MG/DOSE SOPN, Inject 0.5 mg into the skin once a week., Disp: 2 pen, Rfl: 2 .   temazepam (RESTORIL) 15 MG capsule, Take 1 capsule (15 mg total) by mouth as needed., Disp: 30 capsule, Rfl: 2 .  traMADol (ULTRAM) 50 MG tablet, Take 1 tablet (50 mg total) by mouth every 12 (twelve) hours as needed. For pain, Disp: 60 tablet, Rfl: 2 .  valsartan (DIOVAN) 320 MG tablet, Take 1 tablet (320 mg total) by mouth daily., Disp: 30 tablet, Rfl: 0  No Known Allergies   ROS  Constitutional: Negative for fever, positive for mild  weight change.  Respiratory: Negative for cough and shortness of breath.   Cardiovascular: Negative for chest pain or palpitations.  Gastrointestinal: Negative for abdominal pain, no bowel changes.  Musculoskeletal: Negative for gait problem or joint swelling.  Skin: Negative for rash.  Neurological: Negative for dizziness , positive for intermittent headache.  No other specific complaints in a complete review of systems (except as listed in HPI above).   Objective  Vitals:   08/04/18 0937  BP: 108/64  Pulse: 80  Resp: 16  Temp: 98.2 F (36.8 C)  TempSrc: Oral  SpO2: 96%  Weight: 156 lb 9.6 oz (71 kg)  Height: 5\' 3"  (1.6 m)    Body mass index is 27.74 kg/m.  Physical Exam  Constitutional: Patient appears well-developed and well-nourished. Obese  No distress.  HEENT: head atraumatic, normocephalic, pupils equal and reactive to light, neck supple, throat within normal limits Cardiovascular: Normal rate, regular rhythm and normal heart sounds.  No murmur heard. No BLE edema. Pulmonary/Chest: Effort normal and breath sounds normal. No respiratory distress. Abdominal: Soft.  There is no tenderness. Psychiatric: Patient has a normal mood and affect. behavior is normal. Judgment and thought content normal. Muscular Skeletal: wearing a sling on left arm  Recent Results (from the past 2160 hour(s))  POCT HgB A1C     Status: Normal   Collection Time: 08/04/18  9:40 AM  Result Value Ref Range   Hemoglobin A1C     HbA1c POC (<> result, manual  entry)     HbA1c, POC (prediabetic range)  HbA1c, POC (controlled diabetic range) 5.7 0.0 - 7.0 %      PHQ2/9: Depression screen [facility] 2/9 08/04/2018 05/03/2018 09/16/2017 08/19/2016 07/23/2016  Decreased Interest 0 0 0 0 0  Down, Depressed, Hopeless 0 0 0 0 0  PHQ - 2 Score 0 0 0 0 0     Fall Risk: Fall Risk  08/04/2018 05/03/2018 01/28/2018 09/16/2017 08/19/2016  Falls in the past year? Yes Yes Yes No No  Comment - Tripped at work - - -  Number falls in past yr: 1 1 1  - -  Injury with Fall? Yes Yes Yes - -  Comment Left shoulder - - - -  Follow up - - Falls prevention discussed - -     Functional Status Survey: Is the patient deaf or have difficulty hearing?: No Does the patient have difficulty seeing, even when wearing glasses/contacts?: Yes(contacts and glasses) Does the patient have difficulty concentrating, remembering, or making decisions?: No Does the patient have difficulty walking or climbing stairs?: No Does the patient have difficulty dressing or bathing?: No Does the patient have difficulty doing errands alone such as visiting a doctor's office or shopping?: No    Assessment & Plan  1. Type 2 diabetes mellitus with stage 3 chronic kidney disease, without long-term current use of insulin (HCC)  - POCT HgB A1C Hold Ozempic next week and see if nausea/vomiting resolves   2. Chronic bilateral low back pain with bilateral sciatica  - cyclobenzaprine (FLEXERIL) 5 MG tablet; Take 1-2 tablets (5-10 mg total) by mouth 2 (two) times daily. One in am and 2 in pm  Dispense: 90 tablet; Refill: 2  3. Dyslipidemia  Taking medication   4. Atherosclerosis of abdominal aorta (HCC)  On aspirin and statin therapy   5. Benign essential HTN  - valsartan (DIOVAN) 320 MG tablet; Take 1 tablet (320 mg total) by mouth daily.  Dispense: 30 tablet; Refill: 0  Stop Exforge and try Diovan since bp is towards low end of normal   6. Tension headache  Doing better   7. Other  insomnia  Taking medication prn   8. History of shoulder surgery  She got pain medication from Dr. Derryl Harbor and just filled rx of Tramadol he gave it to her last week, explained from now on only get pain medication from me.   9. Non-intractable vomiting with nausea, unspecified vomiting type  - promethazine (PHENERGAN) 12.5 MG tablet; Take 1 tablet (12.5 mg total) by mouth every 8 (eight) hours as needed for nausea or vomiting.  Dispense: 10 tablet; Refill: 0

## 2018-08-04 NOTE — Patient Instructions (Addendum)
Stop Exforge 5/160 mg - bp medication  Start Diovan 320 ( valsartan) for bp  Hold Ozempic and see if nausea and vomiting resolved Take Naproxen with food Ask Dr. Derryl HarborBower if he will manage your pain from now on and let us know Return in 2 weeks for nurse visit    Food Choices for Gastroesophageal Reflux Disease, Adult When you have gastroesophageal reflux disease (GERD), the foods you eat and your eating habits are very important. Choosing the right foods can help ease your discomfort. What guidelines do I need to follow?  Choose fruits, vegetables, whole grains, and low-fat dairy products.  Choose low-fat meat, fish, and poultry.  Limit fats such as oils, salad dressings, butter, nuts, and avocado.  Keep a food diary. This helps you identify foods that cause symptoms.  Avoid foods that cause symptoms. These may be different for everyone.  Eat small meals often instead of 3 large meals a day.  Eat your meals slowly, in a place where you are relaxed.  Limit fried foods.  Cook foods using methods other than frying.  Avoid drinking alcohol.  Avoid drinking large amounts of liquids with your meals.  Avoid bending over or lying down until 2-3 hours after eating. What foods are not recommended? These are some foods and drinks that may make your symptoms worse: Vegetables Tomatoes. Tomato juice. Tomato and spaghetti sauce. Chili peppers. Onion and garlic. Horseradish. Fruits Oranges, grapefruit, and lemon (fruit and juice). Meats High-fat meats, fish, and poultry. This includes hot dogs, ribs, ham, sausage, salami, and bacon. Dairy Whole milk and chocolate milk. Sour cream. Cream. Butter. Ice cream. Cream cheese. Drinks Coffee and tea. Bubbly (carbonated) drinks or energy drinks. Condiments Hot sauce. Barbecue sauce. Sweets/Desserts Chocolate and cocoa. Donuts. Peppermint and spearmint. Fats and Oils High-fat foods. This includes [place] fries and potato  chips. Other Vinegar. Strong spices. This includes black pepper, white pepper, red pepper, cayenne, curry powder, cloves, ginger, and chili powder. The items listed above may not be a complete list of foods and drinks to avoid. Contact your dietitian for more information. This information is not intended to replace advice given to you by your health care provider. Make sure you discuss any questions you have with your health care provider. Document Released: 06/14/2012 Document Revised: 05/21/2016 Document Reviewed: 10/18/2013 Elsevier Interactive Patient Education  2017 [employer]

## 2018-08-14 ENCOUNTER — Other Ambulatory Visit: Payer: Self-pay | Admitting: Family Medicine

## 2018-08-14 DIAGNOSIS — E1142 Type 2 diabetes mellitus with diabetic polyneuropathy: Secondary | ICD-10-CM

## 2018-08-14 DIAGNOSIS — M5442 Lumbago with sciatica, left side: Secondary | ICD-10-CM

## 2018-08-14 DIAGNOSIS — G8929 Other chronic pain: Secondary | ICD-10-CM

## 2018-08-16 ENCOUNTER — Other Ambulatory Visit: Payer: Self-pay | Admitting: Family Medicine

## 2018-08-16 DIAGNOSIS — I10 Essential (primary) hypertension: Secondary | ICD-10-CM

## 2018-08-18 ENCOUNTER — Ambulatory Visit: Payer: BLUE CROSS/BLUE SHIELD

## 2018-08-18 VITALS — BP 110/64 | HR 71

## 2018-08-18 DIAGNOSIS — I10 Essential (primary) hypertension: Secondary | ICD-10-CM

## 2018-08-18 NOTE — Progress Notes (Signed)
Patient is here for a blood pressure check. Patient denies chest pain, palpitations, shortness of breath or visual disturbances. At previous visit blood pressure was 108/64 with a heart rate of 80. Today during nurse visit first check blood pressure was 110/64.    Last Visit: 08/04/2018:  valsartan (DIOVAN) 320 MG tablet; Take 1 tablet (320 mg total) by mouth daily.  Dispense: 30 tablet; Refill:  Stop Exforge and try Diovan since bp is towards low end of normal   Cut Diovan in half and come back in 1 week to recheck BP.

## 2018-08-25 ENCOUNTER — Ambulatory Visit: Payer: BLUE CROSS/BLUE SHIELD

## 2018-08-25 VITALS — BP 118/64

## 2018-08-25 DIAGNOSIS — I10 Essential (primary) hypertension: Secondary | ICD-10-CM

## 2018-09-19 ENCOUNTER — Other Ambulatory Visit (HOSPITAL_COMMUNITY)
Admission: RE | Admit: 2018-09-19 | Discharge: 2018-09-19 | Disposition: A | Discharge status: Home / Self Care | Payer: BLUE CROSS/BLUE SHIELD | Source: Ambulatory Visit | Attending: Family Medicine | Admitting: Family Medicine

## 2018-09-19 ENCOUNTER — Encounter: Payer: Self-pay | Admitting: Family Medicine

## 2018-09-19 ENCOUNTER — Ambulatory Visit (INDEPENDENT_AMBULATORY_CARE_PROVIDER_SITE_OTHER): Payer: BLUE CROSS/BLUE SHIELD | Admitting: Family Medicine

## 2018-09-19 ENCOUNTER — Encounter: Payer: Self-pay | Admitting: Emergency Medicine

## 2018-09-19 VITALS — BP 124/80 | HR 97 | Temp 98.5°F | Resp 16 | Ht 63.0 in | Wt 153.1 lb

## 2018-09-19 DIAGNOSIS — Z1231 Encounter for screening mammogram for malignant neoplasm of breast: Secondary | ICD-10-CM

## 2018-09-19 DIAGNOSIS — E1122 Type 2 diabetes mellitus with diabetic chronic kidney disease: Secondary | ICD-10-CM | POA: Diagnosis not present

## 2018-09-19 DIAGNOSIS — Z114 Encounter for screening for human immunodeficiency virus [HIV]: Secondary | ICD-10-CM | POA: Diagnosis not present

## 2018-09-19 DIAGNOSIS — E785 Hyperlipidemia, unspecified: Secondary | ICD-10-CM | POA: Diagnosis not present

## 2018-09-19 DIAGNOSIS — E559 Vitamin D deficiency, unspecified: Secondary | ICD-10-CM | POA: Diagnosis not present

## 2018-09-19 DIAGNOSIS — Z0000 Encounter for general adult medical examination without abnormal findings: Secondary | ICD-10-CM

## 2018-09-19 DIAGNOSIS — Z862 Personal history of diseases of the blood and blood-forming organs and certain disorders involving the immune mechanism: Secondary | ICD-10-CM

## 2018-09-19 DIAGNOSIS — Z23 Encounter for immunization: Secondary | ICD-10-CM | POA: Diagnosis not present

## 2018-09-19 DIAGNOSIS — N183 Chronic kidney disease, stage 3 unspecified: Secondary | ICD-10-CM

## 2018-09-19 DIAGNOSIS — E669 Obesity, unspecified: Secondary | ICD-10-CM

## 2018-09-19 DIAGNOSIS — Z113 Encounter for screening for infections with a predominantly sexual mode of transmission: Secondary | ICD-10-CM

## 2018-09-19 NOTE — Patient Instructions (Signed)
Preventive Care 40-64 Years, Female Preventive care refers to lifestyle choices and visits with your health care provider that can promote health and wellness. What does preventive care include?  A yearly physical exam. This is also called an annual well check.  Dental exams once or twice a year.  Routine eye exams. Ask your health care provider how often you should have your eyes checked.  Personal lifestyle choices, including: ? Daily care of your teeth and gums. ? Regular physical activity. ? Eating a healthy diet. ? Avoiding tobacco and drug use. ? Limiting alcohol use. ? Practicing safe sex. ? Taking low-dose aspirin daily starting at age 58. ? Taking vitamin and mineral supplements as recommended by your health care provider. What happens during an annual well check? The services and screenings done by your health care provider during your annual well check will depend on your age, overall health, lifestyle risk factors, and family history of disease. Counseling Your health care provider may ask you questions about your:  Alcohol use.  Tobacco use.  Drug use.  Emotional well-being.  Home and relationship well-being.  Sexual activity.  Eating habits.  Work and work Statistician.  Method of birth control.  Menstrual cycle.  Pregnancy history.  Screening You may have the following tests or measurements:  Height, weight, and BMI.  Blood pressure.  Lipid and cholesterol levels. These may be checked every 5 years, or more frequently if you are over 81 years old.  Skin check.  Lung cancer screening. You may have this screening every year starting at age 78 if you have a 30-pack-year history of smoking and currently smoke or have quit within the past 15 years.  Fecal occult blood test (FOBT) of the stool. You may have this test every year starting at age 65.  Flexible sigmoidoscopy or colonoscopy. You may have a sigmoidoscopy every 5 years or a colonoscopy  every 10 years starting at age 30.  Hepatitis C blood test.  Hepatitis B blood test.  Sexually transmitted disease (STD) testing.  Diabetes screening. This is done by checking your blood sugar (glucose) after you have not eaten for a while (fasting). You may have this done every 1-3 years.  Mammogram. This may be done every 1-2 years. Talk to your health care provider about when you should start having regular mammograms. This may depend on whether you have a family history of breast cancer.  BRCA-related cancer screening. This may be done if you have a family history of breast, ovarian, tubal, or peritoneal cancers.  Pelvic exam and Pap test. This may be done every 3 years starting at age 80. Starting at age 36, this may be done every 5 years if you have a Pap test in combination with an HPV test.  Bone density scan. This is done to screen for osteoporosis. You may have this scan if you are at high risk for osteoporosis.  Discuss your test results, treatment options, and if necessary, the need for more tests with your health care provider. Vaccines Your health care provider may recommend certain vaccines, such as:  Influenza vaccine. This is recommended every year.  Tetanus, diphtheria, and acellular pertussis (Tdap, Td) vaccine. You may need a Td booster every 10 years.  Varicella vaccine. You may need this if you have not been vaccinated.  Zoster vaccine. You may need this after age 5.  Measles, mumps, and rubella (MMR) vaccine. You may need at least one dose of MMR if you were born in  1959 or later. You may also need a second dose.  Pneumococcal 13-valent conjugate (PCV13) vaccine. You may need this if you have certain conditions and were not previously vaccinated.  Pneumococcal polysaccharide (PPSV23) vaccine. You may need one or two doses if you smoke cigarettes or if you have certain conditions.  Meningococcal vaccine. You may need this if you have certain  conditions.  Hepatitis A vaccine. You may need this if you have certain conditions or if you travel or work in places where you may be exposed to hepatitis A.  Hepatitis B vaccine. You may need this if you have certain conditions or if you travel or work in places where you may be exposed to hepatitis B.  Haemophilus influenzae type b (Hib) vaccine. You may need this if you have certain conditions.  Talk to your health care provider about which screenings and vaccines you need and how often you need them. This information is not intended to replace advice given to you by your health care provider. Make sure you discuss any questions you have with your health care provider. Document Released: 01/10/2016 Document Revised: 09/02/2016 Document Reviewed: 10/15/2015 Elsevier Interactive Patient Education  2018 Elsevier Inc.  

## 2018-09-19 NOTE — Progress Notes (Signed)
Name: Brandy Johnston   MRN: [id number]    DOB: September 17, 1961   Date:09/19/2018       Progress Note  Subjective  Chief Complaint  Chief Complaint  Patient presents with  . Annual Exam    HPI  Patient presents for annual CPE.  [facility] did shoulder surgery June 10 2018 - she is still waiting to be cleared by them to go back to work.  Diet: She is starting to try to eat more - appetite is back a little bit since decreasing Ozempic.  Eats out every now and then; does cook at home sometimes Exercise: She does not exercise now that she has LEFT shoulder injury - doing PT for this; does try to walk sometimes.  USPSTF grade A and B recommendations    Office Visit from 09/19/2018 in [facility]  AUDIT-C Score  1     Depression: She did just find out today that her brother-in-law was found dead in his home.   Depression screen [facility] 2/9 09/19/2018 08/04/2018 05/03/2018 09/16/2017 08/19/2016  Decreased Interest 0 0 0 0 0  Down, Depressed, Hopeless 0 0 0 0 0  PHQ - 2 Score 0 0 0 0 0  Altered sleeping 0 - - - -  Tired, decreased energy 0 - - - -  Change in appetite 0 - - - -  Feeling bad or failure about yourself  0 - - - -  Trouble concentrating 0 - - - -  Moving slowly or fidgety/restless 0 - - - -  Suicidal thoughts 0 - - - -  PHQ-9 Score 0 - - - -  Difficult doing work/chores Not difficult at all - - - -   Hypertension: BP Readings from Last 3 Encounters:  09/19/18 124/80  08/25/18 118/64  08/18/18 110/64   Obesity: Down 4lbs since last visit.  Wt Readings from Last 3 Encounters:  09/19/18 153 lb 1.6 oz (69.4 kg)  08/04/18 156 lb 9.6 oz (71 kg)  05/03/18 160 lb (72.6 kg)   BMI Readings from Last 3 Encounters:  09/19/18 27.12 kg/m  08/04/18 27.74 kg/m  05/03/18 28.34 kg/m    Hep C Screening: Negative in 2013 STD testing and prevention (HIV/chl/gon/syphilis): We will check today Intimate partner violence: No concerns Sexual History/Pain during Intercourse:  No pain with intercourse Menstrual History/LMP/Abnormal Bleeding: No abnormal bleeding; postmenopausal Incontinence Symptoms: No concerns  Advanced Care Planning: A voluntary discussion about advance care planning including the explanation and discussion of advance directives.  Discussed health care proxy and Living will, and the patient was able to identify a health care proxy as Terryl Niziolek (Son).  Patient does not have a living will at present time. If patient does have living will, I have requested they bring this to the clinic to be scanned in to their chart.  Breast cancer: Last Mammogram was 2018 BRCA gene screening: Maternal Aunt had breast cancer.  Routine mammograms recommended Cervical cancer screening: Due next year  Osteoporosis Screening: She states has had in the past and no diagnosis of osteopenia or osteoporosis.  No results found for: HMDEXASCAN  Lipids:  Lab Results  Component Value Date   CHOL 131 09/16/2017   CHOL 211 (H) 01/20/2017   CHOL 160 04/21/2016   Lab Results  Component Value Date   HDL 52 09/16/2017   HDL 55 01/20/2017   HDL 55 04/21/2016   Lab Results  Component Value Date   LDLCALC 63 09/16/2017   [place]  137 (H) 01/20/2017   LDLCALC 88 04/21/2016   Lab Results  Component Value Date   TRIG 78 09/16/2017   TRIG 93 01/20/2017   TRIG 86 04/21/2016   Lab Results  Component Value Date   CHOLHDL 2.5 09/16/2017   CHOLHDL 3.8 01/20/2017   CHOLHDL 2.9 04/21/2016   No results found for: LDLDIRECT  Glucose:  Glucose  Date Value Ref Range Status  07/08/2012 90 65 - 99 mg/dL Final  07/07/2012 105 (H) 65 - 99 mg/dL Final   Glucose, Bld  Date Value Ref Range Status  01/28/2018 88 65 - 99 mg/dL Final    Comment:    .            Fasting reference interval .   09/16/2017 90 65 - 99 mg/dL Final    Comment:    .            Fasting reference interval .   01/20/2017 113 (H) 65 - 99 mg/dL Final   Glucose-Capillary  Date Value Ref Range  Status  09/02/2015 102 (H) 65 - 99 mg/dL Final    Skin cancer: No conerning moles or lesions Colorectal cancer: Due 2022 Lung cancer:  Endorses MArijuana use occasionally - maybe weekly for about 20 years.  Never user of cigarettes   ECG: On file from 2018  Patient Active Problem List   Diagnosis Date Noted  . Chronic bilateral back pain 07/26/2017  . Bilateral shoulder pain 07/26/2017  . Coronary artery calcification 05/17/2017  . Heart palpitations 05/15/2017  . History of acute gastritis   . Leukocytosis 09/30/2016  . Atherosclerosis of abdominal aorta ([place]) 08/18/2016  . Type 2 diabetes mellitus with stage 3 chronic kidney disease, without long-term current use of insulin ([place]) 12/16/2015  . Diabetic neuropathy associated with type 2 diabetes mellitus ([place]) 09/18/2015  . Insomnia 09/18/2015  . Benign essential HTN 06/18/2015  . Chronic kidney disease (CKD), stage III (moderate) ([place]) 06/18/2015  . Diabetes mellitus with renal manifestation ([place]) 06/18/2015  . Abnormal serum level of alkaline phosphatase 06/18/2015  . Bilateral low back pain with left-sided sciatica 06/18/2015  . Obesity (BMI 30.0-34.9) 06/18/2015  . Degenerative arthritis of hip 06/18/2015  . Sickle cell trait ([place]) 06/18/2015  . Dyslipidemia 05/27/2010  . Gastro-esophageal reflux disease without esophagitis 05/25/2008    Past Surgical History:  Procedure Laterality Date  . CHOLECYSTECTOMY    . COLONOSCOPY    . ESOPHAGOGASTRODUODENOSCOPY (EGD) WITH PROPOFOL N/A 10/05/2016   Procedure: ESOPHAGOGASTRODUODENOSCOPY (EGD) WITH PROPOFOL;  Surgeon: Lucilla Lame, MD;  Location: [place];  Service: Endoscopy;  Laterality: N/A;  . FRACTURE SURGERY Left    cast and pins   . shoulder surgery  Left 06/10/2018   Dr. Marica Otter  . TUBAL LIGATION      Family History  Problem Relation Age of Onset  . Migraines Mother   . Diabetes Mother   . Cancer Mother        lung  . Arthritis Brother   . Breast cancer  Maternal Aunt   . Cancer Maternal Uncle        Lung and Colon  . Cirrhosis Brother   . Breast cancer Cousin     Social History   Socioeconomic History  . Marital status: Single    Spouse name: Not on file  . Number of children: 2  . Years of education: Not on file  . Highest education level: Not on file  Occupational History  . Not on file  Social Needs  . Financial resource strain: Not very hard  . Food insecurity:    Worry: Never true    Inability: Never true  . Transportation needs:    Medical: No    Non-medical: No  Tobacco Use  . Smoking status: Never Smoker  . Smokeless tobacco: Never Used  Substance and Sexual Activity  . Alcohol use: Yes    Alcohol/week: 0.0 standard drinks    Comment: rare  . Drug use: Yes    Types: Marijuana    Comment: smokes marijuana occasionally  . Sexual activity: Yes    Partners: Female    Birth control/protection: Other-see comments  Lifestyle  . Physical activity:    Days per week: 0 days    Minutes per session: 0 min  . Stress: Only a little  Relationships  . Social connections:    Talks on phone: More than three times a week    Gets together: More than three times a week    Attends religious service: Never    Active member of club or organization: No    Attends meetings of clubs or organizations: Never    Relationship status: Never married  . Intimate partner violence:    Fear of current or ex partner: No    Emotionally abused: No    Physically abused: No    Forced sexual activity: No  Other Topics Concern  . 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  aspirin (ASPIRIN 81) 81 MG chewable tablet, Chew 1 tablet (81 mg total) by mouth daily., Disp: 30 tablet, Rfl: 0 .  atorvastatin (LIPITOR) 20 MG tablet, Take 1 tablet (20 mg  total) by mouth daily., Disp: 30 tablet, Rfl: 5 .  Butalbital-APAP-Caffeine 50-300-40 MG CAPS, Take 1 capsule by mouth every 4 (four) hours as needed., Disp: 40 capsule, Rfl: 0 .  Cholecalciferol (VITAMIN D) 2000 units CAPS, Take 1 capsule (2,000 Units total) by mouth daily., Disp: 30 capsule, Rfl: 0 .  cyclobenzaprine (FLEXERIL) 5 MG tablet, Take 1-2 tablets (5-10 mg total) by mouth 2 (two) times daily. One in am and 2 in pm, Disp: 90 tablet, Rfl: 2 .  gabapentin (NEURONTIN) 300 MG capsule, TAKE ONE CAPSULE BY MOUTH TWICE DAILY, Disp: 60 capsule, Rfl: 0 .  Magnesium Oxide 400 MG CAPS, Take 1 capsule (400 mg total) by mouth 2 (two) times daily., Disp: 60 capsule, Rfl: 5 .  naproxen sodium (ANAPROX) 550 MG tablet, take 1 tablet by mouth twice a day if needed, Disp: , Rfl: 0 .  omeprazole (PRILOSEC) 20 MG capsule, Take 1 capsule (20 mg total) by mouth daily., Disp: 30 capsule, Rfl: 5 .  promethazine (PHENERGAN) 12.5 MG tablet, Take 1 tablet (12.5 mg total) by mouth every 8 (eight) hours as needed for nausea or vomiting., Disp: 10 tablet, Rfl: 0 .  RA COL-RITE 100 MG capsule, Take 100 mg by mouth 2 (two) times daily., Disp: , Rfl: 0 .  Semaglutide (OZEMPIC) 0.25 or 0.5 MG/DOSE SOPN, Inject 0.5 mg into the skin once a week., Disp: 2 pen, Rfl: 2 .  temazepam (RESTORIL) 15 MG capsule, Take 1 capsule (15 mg total) by mouth as needed., Disp: 30 capsule, Rfl: 2 .  traMADol (ULTRAM) 50 MG tablet, Take 1  tablet (50 mg total) by mouth every 12 (twelve) hours as needed. For pain, Disp: 60 tablet, Rfl: 2 .  valsartan (DIOVAN) 320 MG tablet, TAKE 1 TABLET BY MOUTH ONCE DAILY, Disp: 30 tablet, Rfl: 0  No Known Allergies   ROS  Constitutional: Negative for fever or weight change.  Respiratory: Negative for cough and shortness of breath.   Cardiovascular: Negative for chest pain or palpitations.  Gastrointestinal: Negative for abdominal pain, no bowel changes.  Musculoskeletal: Negative for gait problem or  joint swelling.  Skin: Negative for rash.  Neurological: Negative for dizziness or headache.  No other specific complaints in a complete review of systems (except as listed in HPI above).  Objective  Vitals:   09/19/18 1033  BP: 124/80  Pulse: 97  Resp: 16  Temp: 98.5 F (36.9 C)  TempSrc: Oral  SpO2: 96%  Weight: 153 lb 1.6 oz (69.4 kg)  Height: 5' 3"  (1.6 m)    Body mass index is 27.12 kg/m.  Physical Exam Constitutional: Patient appears well-developed and well-nourished. No distress.  HENT: Head: Normocephalic and atraumatic. Ears: B TMs ok, no erythema or effusion; Nose: Nose normal. Mouth/Throat: Oropharynx is clear and moist. No oropharyngeal exudate.  Eyes: Conjunctivae and EOM are normal. Pupils are equal, round, and reactive to light. No scleral icterus.  Neck: Normal range of motion. Neck supple. No JVD present. No thyromegaly present.  Cardiovascular: Normal rate, regular rhythm and normal heart sounds.  No murmur heard. No BLE edema. Pulmonary/Chest: Effort normal and breath sounds normal. No respiratory distress. Abdominal: Soft. Bowel sounds are normal, no distension. There is no tenderness. no masses Breast: no lumps or masses, no nipple discharge or rashes FEMALE GENITALIA: Deferred Musculoskeletal: Normal range of motion - except for LEFT shoulder which has limited AROM and weakness, no joint effusions. No gross deformities. Neurological: he is alert and oriented to person, place, and time. No cranial nerve deficit. Coordination, balance, strength, speech and gait are normal.  Skin: Skin is warm and dry. No rash noted. No erythema.  Psychiatric: Patient has a normal mood and affect. behavior is normal. Judgment and thought content normal.  Recent Results (from the past 2160 hour(s))  POCT HgB A1C     Status: Normal   Collection Time: 08/04/18  9:40 AM  Result Value Ref Range   Hemoglobin A1C     HbA1c POC (<> result, manual entry)     HbA1c, POC  (prediabetic range)     HbA1c, POC (controlled diabetic range) 5.7 0.0 - 7.0 %   PHQ2/9: Depression screen [facility] 2/9 09/19/2018 08/04/2018 05/03/2018 09/16/2017 08/19/2016  Decreased Interest 0 0 0 0 0  Down, Depressed, Hopeless 0 0 0 0 0  PHQ - 2 Score 0 0 0 0 0  Altered sleeping 0 - - - -  Tired, decreased energy 0 - - - -  Change in appetite 0 - - - -  Feeling bad or failure about yourself  0 - - - -  Trouble concentrating 0 - - - -  Moving slowly or fidgety/restless 0 - - - -  Suicidal thoughts 0 - - - -  PHQ-9 Score 0 - - - -  Difficult doing work/chores Not difficult at all - - - -   Fall Risk: Fall Risk  09/19/2018 08/04/2018 05/03/2018 01/28/2018 09/16/2017  Falls in the past year? Yes Yes Yes Yes No  Comment - - Tripped at work - -  Number falls in past yr: 1 1  1 1 -  Injury with Fall? Yes Yes Yes Yes -  Comment - Left shoulder - - -  Follow up - - - Falls prevention discussed -   Assessment & Plan  1. Well woman exam (no gynecological exam) -USPSTF grade A and B recommendations reviewed with patient; age-appropriate recommendations, preventive care, screening tests, etc discussed and encouraged; healthy living encouraged; see AVS for patient education given to patient -Discussed importance of 150 minutes of physical activity weekly, eat two servings of fish weekly, eat one serving of tree nuts ( cashews, pistachios, pecans, almonds.Marland Kitchen) every other day, eat 6 servings of fruit/vegetables daily and drink plenty of water and avoid sweet beverages.  - Tdap vaccine greater than or equal to 7yo IM - Flu Vaccine QUAD 6+ mos PF IM (Fluarix Quad PF) - HIV Antibody (routine testing w rflx) - RPR - Cervicovaginal ancillary only - MM DIGITAL SCREENING BILATERAL; Future  2. Need for Tdap vaccination - Tdap vaccine greater than or equal to 7yo IM - Not given - not in stock  3. Flu vaccine need - Flu Vaccine QUAD 6+ mos PF IM (Fluarix Quad PF)  4. Routine screening for STI (sexually transmitted  infection) - HIV Antibody (routine testing w rflx) - RPR - Cervicovaginal ancillary only  5. Encounter for screening for HIV - HIV Antibody (routine testing w rflx)  6. Breast cancer screening by mammogram - MM DIGITAL SCREENING BILATERAL; Future  7. History of anemia - CBC w/Diff/Platelet  8. Type 2 diabetes mellitus with stage 3 chronic kidney disease, without long-term current use of insulin (HCC) - COMPLETE METABOLIC PANEL WITH GFR  9. Obesity (BMI 30.0-34.9) - CBC w/Diff/Platelet - COMPLETE METABOLIC PANEL WITH GFR  10. Vitamin D deficiency - Vitamin D (25 hydroxy)  11. Dyslipidemia - Lipid panel

## 2018-09-20 LAB — CERVICOVAGINAL ANCILLARY ONLY
Chlamydia: NEGATIVE
Neisseria Gonorrhea: NEGATIVE

## 2018-09-20 LAB — CBC WITH DIFFERENTIAL/PLATELET
Basophils Absolute: 59 cells/uL (ref 0–200)
Basophils Relative: 0.7 %
Eosinophils Absolute: 176 cells/uL (ref 15–500)
Eosinophils Relative: 2.1 %
HCT: 36.4 % (ref 35.0–45.0)
Hemoglobin: 12.3 g/dL (ref 11.7–15.5)
Lymphs Abs: 2453 cells/uL (ref 850–3900)
MCH: 26.9 pg — ABNORMAL LOW (ref 27.0–33.0)
MCHC: 33.8 g/dL (ref 32.0–36.0)
MCV: 79.6 fL — ABNORMAL LOW (ref 80.0–100.0)
MPV: 11.8 fL (ref 7.5–12.5)
Monocytes Relative: 4.4 %
Neutro Abs: 5342 cells/uL (ref 1500–7800)
Neutrophils Relative %: 63.6 %
Platelets: 230 10*3/uL (ref 140–400)
RBC: 4.57 10*6/uL (ref 3.80–5.10)
RDW: 14.2 % (ref 11.0–15.0)
Total Lymphocyte: 29.2 %
WBC mixed population: 370 cells/uL (ref 200–950)
WBC: 8.4 10*3/uL (ref 3.8–10.8)

## 2018-09-20 LAB — COMPLETE METABOLIC PANEL WITH GFR
AG Ratio: 1.2 (calc) (ref 1.0–2.5)
ALT: 14 U/L (ref 6–29)
AST: 17 U/L (ref 10–35)
Albumin: 4.3 g/dL (ref 3.6–5.1)
Alkaline phosphatase (APISO): 145 U/L — ABNORMAL HIGH (ref 33–130)
BUN/Creatinine Ratio: 11 (calc) (ref 6–22)
BUN: 12 mg/dL (ref 7–25)
CO2: 28 mmol/L (ref 20–32)
Calcium: 9.4 mg/dL (ref 8.6–10.4)
Chloride: 105 mmol/L (ref 98–110)
Creat: 1.1 mg/dL — ABNORMAL HIGH (ref 0.50–1.05)
GFR, Est African American: 64 mL/min/{1.73_m2} (ref >=60)
GFR, Est Non African American: 55 mL/min/{1.73_m2} — ABNORMAL LOW (ref >=60)
Globulin: 3.6 g/dL (calc) (ref 1.9–3.7)
Glucose, Bld: 87 mg/dL (ref 65–99)
Potassium: 3.5 mmol/L (ref 3.5–5.3)
Sodium: 141 mmol/L (ref 135–146)
Total Bilirubin: 0.4 mg/dL (ref 0.2–1.2)
Total Protein: 7.9 g/dL (ref 6.1–8.1)

## 2018-09-20 LAB — LIPID PANEL
Cholesterol: 158 mg/dL (ref <200)
HDL: 58 mg/dL (ref >50)
LDL Cholesterol (Calc): 80 mg/dL (calc)
Non-HDL Cholesterol (Calc): 100 mg/dL (calc) (ref <130)
Total CHOL/HDL Ratio: 2.7 (calc) (ref <5.0)
Triglycerides: 104 mg/dL (ref <150)

## 2018-09-20 LAB — RPR: RPR Ser Ql: NONREACTIVE

## 2018-09-20 LAB — HIV ANTIBODY (ROUTINE TESTING W REFLEX): HIV 1&2 Ab, 4th Generation: NONREACTIVE

## 2018-09-20 LAB — VITAMIN D 25 HYDROXY (VIT D DEFICIENCY, FRACTURES): Vit D, 25-Hydroxy: 36 ng/mL (ref 30–100)

## 2018-09-22 ENCOUNTER — Other Ambulatory Visit: Payer: Self-pay | Admitting: Family Medicine

## 2018-09-22 ENCOUNTER — Ambulatory Visit
Admission: RE | Admit: 2018-09-22 | Discharge: 2018-09-22 | Disposition: A | Discharge status: Home / Self Care | Payer: BLUE CROSS/BLUE SHIELD | Source: Ambulatory Visit | Attending: Family Medicine | Admitting: Family Medicine

## 2018-09-22 DIAGNOSIS — E1122 Type 2 diabetes mellitus with diabetic chronic kidney disease: Secondary | ICD-10-CM

## 2018-09-22 DIAGNOSIS — G8929 Other chronic pain: Secondary | ICD-10-CM

## 2018-09-22 DIAGNOSIS — Z1231 Encounter for screening mammogram for malignant neoplasm of breast: Secondary | ICD-10-CM

## 2018-09-22 DIAGNOSIS — N183 Chronic kidney disease, stage 3 unspecified: Secondary | ICD-10-CM

## 2018-09-22 DIAGNOSIS — Z0000 Encounter for general adult medical examination without abnormal findings: Secondary | ICD-10-CM

## 2018-09-22 DIAGNOSIS — M5442 Lumbago with sciatica, left side: Principal | ICD-10-CM

## 2018-09-22 DIAGNOSIS — M25512 Pain in left shoulder: Secondary | ICD-10-CM

## 2018-09-22 DIAGNOSIS — E1142 Type 2 diabetes mellitus with diabetic polyneuropathy: Secondary | ICD-10-CM

## 2018-09-22 DIAGNOSIS — M25511 Pain in right shoulder: Secondary | ICD-10-CM

## 2018-09-22 DIAGNOSIS — E785 Hyperlipidemia, unspecified: Secondary | ICD-10-CM

## 2018-09-22 DIAGNOSIS — G4709 Other insomnia: Secondary | ICD-10-CM

## 2018-09-22 DIAGNOSIS — I700 Atherosclerosis of aorta: Secondary | ICD-10-CM

## 2018-09-22 DIAGNOSIS — R790 Abnormal level of blood mineral: Secondary | ICD-10-CM

## 2018-09-22 DIAGNOSIS — M5441 Lumbago with sciatica, right side: Principal | ICD-10-CM

## 2018-09-22 NOTE — Telephone Encounter (Signed)
Copied from CRM [id number]. Topic: Quick Communication - Rx Refill/Question >> Sep 22, 2018 11:23 AM Baldo Daub L wrote: Medication:  Semaglutide (OZEMPIC) 0.25 or 0.5 MG/DOSE SOPN traMADol (ULTRAM) 50 MG tablet atorvastatin (LIPITOR) 20 MG tablet Magnesium Oxide 400 MG CAPS gabapentin (NEURONTIN) 300 MG capsule temazepam (RESTORIL) 15 MG capsule cyclobenzaprine (FLEXERIL) 5 MG tablet  Has the patient contacted their pharmacy? Yes - thought these were supposed to be filled after last OV (Agent: If no, request that the patient contact the pharmacy for the refill.) (Agent: If yes, when and what did the pharmacy advise?)  Preferred Pharmacy (with phone number or street name): [id number] [place], [place] - [address] AT [facility] [phone] (Phone) [phone] (Fax)  Agent: Please be advised that RX refills may take up to 3 business days. We ask that you follow-up with your pharmacy.

## 2018-09-22 NOTE — Telephone Encounter (Signed)
I cannot fill the controlled medications. Can she come in sooner?

## 2018-09-28 ENCOUNTER — Encounter: Payer: Self-pay | Admitting: Family Medicine

## 2018-09-28 ENCOUNTER — Ambulatory Visit: Payer: BLUE CROSS/BLUE SHIELD | Admitting: Family Medicine

## 2018-09-28 VITALS — BP 136/78 | HR 100 | Temp 98.9°F | Resp 16 | Ht 63.0 in | Wt 153.6 lb

## 2018-09-28 DIAGNOSIS — M5442 Lumbago with sciatica, left side: Secondary | ICD-10-CM

## 2018-09-28 DIAGNOSIS — E1122 Type 2 diabetes mellitus with diabetic chronic kidney disease: Secondary | ICD-10-CM | POA: Diagnosis not present

## 2018-09-28 DIAGNOSIS — K219 Gastro-esophageal reflux disease without esophagitis: Secondary | ICD-10-CM

## 2018-09-28 DIAGNOSIS — R790 Abnormal level of blood mineral: Secondary | ICD-10-CM

## 2018-09-28 DIAGNOSIS — E785 Hyperlipidemia, unspecified: Secondary | ICD-10-CM

## 2018-09-28 DIAGNOSIS — D573 Sickle-cell trait: Secondary | ICD-10-CM

## 2018-09-28 DIAGNOSIS — N183 Chronic kidney disease, stage 3 unspecified: Secondary | ICD-10-CM

## 2018-09-28 DIAGNOSIS — Z23 Encounter for immunization: Secondary | ICD-10-CM

## 2018-09-28 DIAGNOSIS — G8929 Other chronic pain: Secondary | ICD-10-CM

## 2018-09-28 DIAGNOSIS — E1142 Type 2 diabetes mellitus with diabetic polyneuropathy: Secondary | ICD-10-CM

## 2018-09-28 DIAGNOSIS — M5441 Lumbago with sciatica, right side: Secondary | ICD-10-CM

## 2018-09-28 DIAGNOSIS — E559 Vitamin D deficiency, unspecified: Secondary | ICD-10-CM | POA: Diagnosis not present

## 2018-09-28 DIAGNOSIS — R112 Nausea with vomiting, unspecified: Secondary | ICD-10-CM

## 2018-09-28 DIAGNOSIS — G4709 Other insomnia: Secondary | ICD-10-CM

## 2018-09-28 DIAGNOSIS — G44209 Tension-type headache, unspecified, not intractable: Secondary | ICD-10-CM

## 2018-09-28 DIAGNOSIS — I700 Atherosclerosis of aorta: Secondary | ICD-10-CM | POA: Diagnosis not present

## 2018-09-28 DIAGNOSIS — I10 Essential (primary) hypertension: Secondary | ICD-10-CM

## 2018-09-28 LAB — POCT GLYCOSYLATED HEMOGLOBIN (HGB A1C)
HbA1c POC (<> result, manual entry): 6.2 % (ref 4.0–5.6)
HbA1c, POC (controlled diabetic range): 6.2 % (ref 0.0–7.0)
HbA1c, POC (prediabetic range): 6.2 % (ref 5.7–6.4)
Hemoglobin A1C: 6.2 % — AB (ref 4.0–5.6)

## 2018-09-28 MED ORDER — PROMETHAZINE HCL 12.5 MG PO TABS: 12.5000 mg | ORAL_TABLET | Freq: Three times a day (TID) | ORAL | 0 refills | Status: DC | PRN

## 2018-09-28 MED ORDER — GABAPENTIN 300 MG PO CAPS: 300.0000 mg | ORAL_CAPSULE | Freq: Two times a day (BID) | ORAL | 5 refills | Status: DC

## 2018-09-28 MED ORDER — MAGNESIUM OXIDE 400 MG PO CAPS: 1.0000 | ORAL_CAPSULE | Freq: Two times a day (BID) | ORAL | 5 refills | Status: DC

## 2018-09-28 MED ORDER — BUTALBITAL-APAP-CAFFEINE 50-300-40 MG PO CAPS: 1.0000 | ORAL_CAPSULE | ORAL | 0 refills | Status: DC | PRN

## 2018-09-28 MED ORDER — OMEPRAZOLE 20 MG PO CPDR: 20.0000 mg | DELAYED_RELEASE_CAPSULE | Freq: Every day | ORAL | 5 refills | Status: DC

## 2018-09-28 MED ORDER — CYCLOBENZAPRINE HCL 5 MG PO TABS: 5.0000 mg | ORAL_TABLET | Freq: Two times a day (BID) | ORAL | 2 refills | Status: DC

## 2018-09-28 MED ORDER — VALSARTAN 160 MG PO TABS: 160.0000 mg | ORAL_TABLET | Freq: Every day | ORAL | 2 refills | Status: DC

## 2018-09-28 MED ORDER — TEMAZEPAM 15 MG PO CAPS: 15.0000 mg | ORAL_CAPSULE | ORAL | 2 refills | Status: DC | PRN

## 2018-09-28 NOTE — Progress Notes (Signed)
Name: Brandy Johnston   MRN: [id number]    DOB: Jan 03, 1961   Date:09/28/2018       Progress Note  Subjective  Chief Complaint  Chief Complaint  Patient presents with  . Medication Refill  . Immunizations    tdap    HPI  DMII with renal manifestation, and neuropathy. She continues to have numbness and tingling on  finger tips and toes are better since she has been magnesium and gabapentin. She has not been checking her glucose levels at home . Denies polyphagia, polydipsia or polyuria. She is offMetformin, and is on Ozempic 0.25 mg daily because higher dose caused nausea and inability to taste food.   Insomnia: She is taking Temazepam prn since not working ( out of working since June 11th for [employer] comp injury). Less stressed but when she goes to bed late she still takes medication to sleep  HTN: taking medication down to 160 mg daily . She is off Exforge because of dizziness, we went down to 320 but was still having symptoms and is tolerating the 160 mg dose. She has occasionally has palpitation ( seen cardiologist and was given Lopressor but she stopped because it did not improve symptoms) - no pain or diaphoresis or SOB associated with palpitation, and states frequency of palpitation is down.   Hyperlipidemia:reviewed labs and last LDL is at goal, but LDL went up , she states taking medication every other day, but will try going back to daily atorvastatin  GERD: she is back on Omeprazole but states not compliant with diet. No epigastric pain, and is off nsaid's now, doing better.   Chronic low back pain with lumbar radiculitis:no recent flares,The left outer thigh and bottom of left foot stays numb. She had NCS and MRI. She does not want to see Dr. Council Mechanic at this time or get PT because of cost, she is on gabapentin and since not working ( had shoulder surgery in June) she is doing better.   Left shoulder surgery : workman's comp, under the care of Dr. Odis Luster and he is  currently giving her Tramadol that we previously rx for back pain, she will let me know when he releases her from pain management, she takes it 4 times daily now, but I used to give it to her for BID dose   Low calcium and magnesium: seen by Endo still taking supplementation, we were supposed to stop Omeprazole and start Ranitidine, but unable to tolerate the switch. Unchanged   Migraine/Tension headaches: she has episodes seldom, usually temporal, sharp, associated with photophobia, but no phonophobia. No nausea or vomiting. She takes prn Fioricet prn, still has a half bottle at home. Not as frequent now and is doing well. Sometimes promethazine and needs refills.   Atherosclerosis Aorta: taking aspirin and is back on statin therapy. Unchanged.   Patient Active Problem List   Diagnosis Date Noted  . Chronic bilateral back pain 07/26/2017  . Bilateral shoulder pain 07/26/2017  . Coronary artery calcification 05/17/2017  . Heart palpitations 05/15/2017  . History of acute gastritis   . Leukocytosis 09/30/2016  . Atherosclerosis of abdominal aorta (HCC) 08/18/2016  . Type 2 diabetes mellitus with stage 3 chronic kidney disease, without long-term current use of insulin (HCC) 12/16/2015  . Diabetic neuropathy associated with type 2 diabetes mellitus (HCC) 09/18/2015  . Insomnia 09/18/2015  . Benign essential HTN 06/18/2015  . Chronic kidney disease (CKD), stage III (moderate) (HCC) 06/18/2015  . Diabetes mellitus with renal manifestation (HCC)  06/18/2015  . Abnormal serum level of alkaline phosphatase 06/18/2015  . Bilateral low back pain with left-sided sciatica 06/18/2015  . Obesity (BMI 30.0-34.9) 06/18/2015  . Degenerative arthritis of hip 06/18/2015  . Sickle cell trait (HCC) 06/18/2015  . Dyslipidemia 05/27/2010  . Gastro-esophageal reflux disease without esophagitis 05/25/2008    Past Surgical History:  Procedure Laterality Date  . CHOLECYSTECTOMY    . COLONOSCOPY    .  ESOPHAGOGASTRODUODENOSCOPY (EGD) WITH PROPOFOL N/A 10/05/2016   Procedure: ESOPHAGOGASTRODUODENOSCOPY (EGD) WITH PROPOFOL;  Surgeon: Midge Minium, MD;  Location: [facility];  Service: Endoscopy;  Laterality: N/A;  . FRACTURE SURGERY Left    cast and pins   . shoulder surgery  Left 06/10/2018   Dr. Derryl Harbor  . TUBAL LIGATION      Family History  Problem Relation Age of Onset  . Migraines Mother   . Diabetes Mother   . Cancer Mother        lung  . Arthritis Brother   . Breast cancer Maternal Aunt   . Cancer Maternal Uncle        Lung and Colon  . Cirrhosis Brother   . Breast cancer Cousin     Social History   Socioeconomic History  . Marital status: Single    Spouse name: Not on file  . Number of children: 2  . Years of education: Not on file  . Highest education level: Not on file  Occupational History  . Not on file  Social Needs  . Financial resource strain: Not very hard  . Food insecurity:    Worry: Never true    Inability: Never true  . Transportation needs:    Medical: No    Non-medical: No  Tobacco Use  . Smoking status: Never Smoker  . Smokeless tobacco: Never Used  Substance and Sexual Activity  . Alcohol use: Yes    Alcohol/week: 0.0 standard drinks    Comment: rare  . Drug use: Yes    Types: Marijuana    Comment: smokes marijuana occasionally  . Sexual activity: Yes    Partners: Female    Birth control/protection: Other-see comments  Lifestyle  . Physical activity:    Days per week: 0 days    Minutes per session: 0 min  . Stress: Only a little  Relationships  . Social connections:    Talks on phone: More than three times a week    Gets together: More than three times a week    Attends religious service: Never    Active member of club or organization: No    Attends meetings of clubs or organizations: Never    Relationship status: Never married  . Intimate partner violence:    Fear of current or ex partner: No    Emotionally abused: No     Physically abused: No    Forced sexual activity: No  Other Topics Concern  . 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  aspirin (ASPIRIN 81) 81 MG chewable tablet, Chew 1 tablet (81 mg total) by mouth daily., Disp: 30 tablet, Rfl: 0 .  atorvastatin (LIPITOR) 20 MG tablet, Take 1 tablet (20 mg total) by mouth daily., Disp: 30  tablet, Rfl: 5 .  Butalbital-APAP-Caffeine 50-300-40 MG CAPS, Take 1 capsule by mouth every 4 (four) hours as needed., Disp: 40 capsule, Rfl: 0 .  Cholecalciferol (VITAMIN D) 2000 units CAPS, Take 1 capsule (2,000 Units total) by mouth daily., Disp: 30 capsule, Rfl: 0 .  cyclobenzaprine (FLEXERIL) 5 MG tablet, Take 1-2 tablets (5-10 mg total) by mouth 2 (two) times daily. One in am and 2 in pm, Disp: 90 tablet, Rfl: 2 .  gabapentin (NEURONTIN) 300 MG capsule, Take 1 capsule (300 mg total) by mouth 2 (two) times daily., Disp: 60 capsule, Rfl: 5 .  Magnesium Oxide 400 MG CAPS, Take 1 capsule (400 mg total) by mouth 2 (two) times daily., Disp: 60 capsule, Rfl: 5 .  omeprazole (PRILOSEC) 20 MG capsule, Take 1 capsule (20 mg total) by mouth daily., Disp: 30 capsule, Rfl: 5 .  OZEMPIC, 0.25 OR 0.5 MG/DOSE, 2 MG/1.5ML SOPN, INJECT 0.5 MG UNDER THE SKIN EVERY WEEK, Disp: 9 mL, Rfl: 0 .  promethazine (PHENERGAN) 12.5 MG tablet, Take 1 tablet (12.5 mg total) by mouth every 8 (eight) hours as needed for nausea or vomiting., Disp: 10 tablet, Rfl: 0 .  RA COL-RITE 100 MG capsule, Take 100 mg by mouth 2 (two) times daily., Disp: , Rfl: 0 .  temazepam (RESTORIL) 15 MG capsule, Take 1 capsule (15 mg total) by mouth as needed., Disp: 30 capsule, Rfl: 2 .  traMADol (ULTRAM) 50 MG tablet, Take 1 tablet (50 mg total) by mouth every 12  (twelve) hours as needed. For pain, Disp: 60 tablet, Rfl: 2 .  valsartan (DIOVAN) 160 MG tablet, Take 1 tablet (160 mg total) by mouth daily., Disp: 30 tablet, Rfl: 2  No Known Allergies  I personally reviewed active problem list, medication list, allergies, family history, social history with the patient/caregiver today.   ROS  Constitutional: Negative for fever or weight change.  Respiratory: Negative for cough and shortness of breath.   Cardiovascular: Negative for chest pain or palpitations.  Gastrointestinal: Negative for abdominal pain, no bowel changes.  Musculoskeletal: Negative for gait problem or joint swelling.  Skin: Negative for rash.  Neurological: Negative for dizziness , positive for  headache.  No other specific complaints in a complete review of systems (except as listed in HPI above).   Objective  Vitals:   09/28/18 1323  BP: 136/78  Pulse: 100  Resp: 16  Temp: 98.9 F (37.2 C)  TempSrc: Oral  SpO2: 99%  Weight: 153 lb 9.6 oz (69.7 kg)  Height: 5\' 3"  (1.6 m)    Body mass index is 27.21 kg/m.  Physical Exam  Constitutional: Patient appears well-developed and well-nourished. No distress.  HEENT: head atraumatic, normocephalic, pupils equal and reactive to light,  neck supple, throat within normal limits Cardiovascular: Normal rate, regular rhythm and normal heart sounds.  No murmur heard. No BLE edema. Pulmonary/Chest: Effort normal and breath sounds normal. No respiratory distress. Abdominal: Soft.  There is no tenderness. Muscular skeletal: pain during palpation of lumbar spine, but worse pain on left shoulder and difficulty with abduction and internal rotation  Psychiatric: Patient has a normal mood and affect. behavior is normal. Judgment and thought content normal.  Recent Results (from the past 2160 hour(s))  POCT HgB A1C     Status: Normal   Collection Time: 08/04/18  9:40 AM  Result Value Ref Range   Hemoglobin A1C     HbA1c POC (<>  result, manual entry)     HbA1c, POC (prediabetic  range)     HbA1c, POC (controlled diabetic range) 5.7 0.0 - 7.0 %  Cervicovaginal ancillary only     Status: None   Collection Time: 09/19/18 12:00 AM  Result Value Ref Range   Chlamydia Negative     Comment: Normal Reference Range - Negative   Neisseria gonorrhea Negative     Comment: Normal Reference Range - Negative  HIV Antibody (routine testing w rflx)     Status: None   Collection Time: 09/19/18 11:16 AM  Result Value Ref Range   HIV 1&2 Ab, 4th Generation NON-REACTIVE NON-REACTI    Comment: HIV-1 antigen and HIV-1/HIV-2 antibodies were not detected. There is no laboratory evidence of HIV infection. Marland Kitchen PLEASE NOTE: This information has been disclosed to you from records whose confidentiality may be protected by state law.  If your state requires such protection, then the state law prohibits you from making any further disclosure of the information without the specific written consent of the person to whom it pertains, or as otherwise permitted by law. A general authorization for the release of medical or other information is NOT sufficient for this purpose. . For additional information please refer to http://education.quest[facility].com/faq/FAQ106 (This link is being provided for informational/ educational purposes only.) . Marland Kitchen The performance of this assay has not been clinically validated in patients less than 40 years old. .   RPR     Status: None   Collection Time: 09/19/18 11:16 AM  Result Value Ref Range   RPR Ser Ql NON-REACTIVE NON-REACTI  Lipid panel     Status: None   Collection Time: 09/19/18 11:16 AM  Result Value Ref Range   Cholesterol 158 <200 mg/dL   HDL 58 >40 mg/dL   Triglycerides 981 <191 mg/dL   LDL Cholesterol (Calc) 80 mg/dL (calc)    Comment: Reference range: <100 . Desirable range <100 mg/dL for primary prevention;   <70 mg/dL for patients with CHD or diabetic patients  with > or = 2 CHD  risk factors. Marland Kitchen LDL-C is now calculated using the Martin-Hopkins  calculation, which is a validated novel method providing  better accuracy than the Friedewald equation in the  estimation of LDL-C.  Horald Pollen et al. Lenox Ahr. 4782;956(21): 2061-2068  (http://education.[facility].com/faq/FAQ164)    Total CHOL/HDL Ratio 2.7 <5.0 (calc)   Non-HDL Cholesterol (Calc) 100 <130 mg/dL (calc)    Comment: For patients with diabetes plus 1 major ASCVD risk  factor, treating to a non-HDL-C goal of <100 mg/dL  (LDL-C of <30 mg/dL) is considered a therapeutic  option.   Vitamin D (25 hydroxy)     Status: None   Collection Time: 09/19/18 11:16 AM  Result Value Ref Range   Vit D, 25-Hydroxy 36 30 - 100 ng/mL    Comment: Vitamin D Status         25-OH Vitamin D: . Deficiency:                    <20 ng/mL Insufficiency:             20 - 29 ng/mL Optimal:                 > or = 30 ng/mL . For 25-OH Vitamin D testing on patients on  D2-supplementation and patients for whom quantitation  of D2 and D3 fractions is required, the QuestAssureD(TM) 25-OH VIT D, (D2,D3), LC/MS/MS is recommended: order  code 86578 (patients >20yrs). . For more information on this test, go to: http://education.[facility].com/faq/FAQ163 (  This link is being provided for  informational/educational purposes only.)   CBC w/Diff/Platelet     Status: Abnormal   Collection Time: 09/19/18 11:16 AM  Result Value Ref Range   WBC 8.4 3.8 - 10.8 Thousand/uL   RBC 4.57 3.80 - 5.10 Million/uL   Hemoglobin 12.3 11.7 - 15.5 g/dL   HCT 16.1 09.6 - 04.5 %   MCV 79.6 (L) 80.0 - 100.0 fL   MCH 26.9 (L) 27.0 - 33.0 pg   MCHC 33.8 32.0 - 36.0 g/dL   RDW 40.9 81.1 - 91.4 %   Platelets 230 140 - 400 Thousand/uL   MPV 11.8 7.5 - 12.5 fL   Neutro Abs 5,342 1,500 - 7,800 cells/uL   Lymphs Abs 2,453 850 - 3,900 cells/uL   WBC mixed population 370 200 - 950 cells/uL   Eosinophils Absolute 176 15 - 500 cells/uL   Basophils Absolute  59 0 - 200 cells/uL   Neutrophils Relative % 63.6 %   Total Lymphocyte 29.2 %   Monocytes Relative 4.4 %   Eosinophils Relative 2.1 %   Basophils Relative 0.7 %  COMPLETE METABOLIC PANEL WITH GFR     Status: Abnormal   Collection Time: 09/19/18 11:16 AM  Result Value Ref Range   Glucose, Bld 87 65 - 99 mg/dL    Comment: .            Fasting reference interval .    BUN 12 7 - 25 mg/dL   Creat 7.82 (H) 9.56 - 1.05 mg/dL    Comment: For patients >49 years of age, the reference limit for Creatinine is approximately 13% higher for people identified as African-American. .    GFR, Est Non African American 55 (L) > OR = 60 mL/min/1.34m2   GFR, Est African American 64 > OR = 60 mL/min/1.39m2   BUN/Creatinine Ratio 11 6 - 22 (calc)   Sodium 141 135 - 146 mmol/L   Potassium 3.5 3.5 - 5.3 mmol/L   Chloride 105 98 - 110 mmol/L   CO2 28 20 - 32 mmol/L   Calcium 9.4 8.6 - 10.4 mg/dL   Total Protein 7.9 6.1 - 8.1 g/dL   Albumin 4.3 3.6 - 5.1 g/dL   Globulin 3.6 1.9 - 3.7 g/dL (calc)   AG Ratio 1.2 1.0 - 2.5 (calc)   Total Bilirubin 0.4 0.2 - 1.2 mg/dL   Alkaline phosphatase (APISO) 145 (H) 33 - 130 U/L   AST 17 10 - 35 U/L   ALT 14 6 - 29 U/L      PHQ2/9: Depression screen [facility] 2/9 09/28/2018 09/19/2018 08/04/2018 05/03/2018 09/16/2017  Decreased Interest 0 0 0 0 0  Down, Depressed, Hopeless 0 0 0 0 0  PHQ - 2 Score 0 0 0 0 0  Altered sleeping 0 0 - - -  Tired, decreased energy 0 0 - - -  Change in appetite 0 0 - - -  Feeling bad or failure about yourself  0 0 - - -  Trouble concentrating 0 0 - - -  Moving slowly or fidgety/restless 0 0 - - -  Suicidal thoughts 0 0 - - -  PHQ-9 Score 0 0 - - -  Difficult doing work/chores Not difficult at all Not difficult at all - - -     Fall Risk: Fall Risk  09/28/2018 09/19/2018 08/04/2018 05/03/2018 01/28/2018  Falls in the past year? Yes Yes Yes Yes Yes  Comment - - - Tripped at work -  Number falls in past yr: 1 1 1 1 1   Injury with Fall? Yes  Yes Yes Yes Yes  Comment - - Left shoulder - -  Follow up - - - - Falls prevention discussed     Functional Status Survey: Is the patient deaf or have difficulty hearing?: No Does the patient have difficulty seeing, even when wearing glasses/contacts?: Yes(contacts and glasses) Does the patient have difficulty concentrating, remembering, or making decisions?: No Does the patient have difficulty walking or climbing stairs?: No Does the patient have difficulty dressing or bathing?: No Does the patient have difficulty doing errands alone such as visiting a doctor's office or shopping?: No    Assessment & Plan  1. Type 2 diabetes mellitus with stage 3 chronic kidney disease, without long-term current use of insulin (HCC)  - POCT HgB A1C  2. Need for Tdap vaccination  - Tdap vaccine greater than or equal to 7yo IM  3. Vitamin D deficiency   4. Atherosclerosis of abdominal aorta (HCC)  Resume daily statin therapy   5. Benign essential HTN  - valsartan (DIOVAN) 160 MG tablet; Take 1 tablet (160 mg total) by mouth daily.  Dispense: 30 tablet; Refill: 2  6. Dyslipidemia  Needs to take statin daily   7. Diabetic polyneuropathy associated with type 2 diabetes mellitus (HCC)  - gabapentin (NEURONTIN) 300 MG capsule; Take 1 capsule (300 mg total) by mouth 2 (two) times daily.  Dispense: 60 capsule; Refill: 5  8. Sickle cell trait (HCC)   9. Chronic bilateral low back pain with bilateral sciatica  - cyclobenzaprine (FLEXERIL) 5 MG tablet; Take 1-2 tablets (5-10 mg total) by mouth 2 (two) times daily. One in am and 2 in pm  Dispense: 90 tablet; Refill: 2  10. Low magnesium level  - Magnesium Oxide 400 MG CAPS; Take 1 capsule (400 mg total) by mouth 2 (two) times daily.  Dispense: 60 capsule; Refill: 5  11. Other insomnia  - temazepam (RESTORIL) 15 MG capsule; Take 1 capsule (15 mg total) by mouth as needed.  Dispense: 30 capsule; Refill: 2  12. Non-intractable vomiting  with nausea, unspecified vomiting type  - promethazine (PHENERGAN) 12.5 MG tablet; Take 1 tablet (12.5 mg total) by mouth every 8 (eight) hours as needed for nausea or vomiting.  Dispense: 10 tablet; Refill: 0  13. Gastro-esophageal reflux disease without esophagitis  - omeprazole (PRILOSEC) 20 MG capsule; Take 1 capsule (20 mg total) by mouth daily.  Dispense: 30 capsule; Refill: 5  14. Chronic bilateral low back pain with left-sided sciatica  - gabapentin (NEURONTIN) 300 MG capsule; Take 1 capsule (300 mg total) by mouth 2 (two) times daily.  Dispense: 60 capsule; Refill: 5  15. Tension headache  - Butalbital-APAP-Caffeine 50-300-40 MG CAPS; Take 1 capsule by mouth every 4 (four) hours as needed.  Dispense: 40 capsule; Refill: 0

## 2018-10-20 DIAGNOSIS — M7552 Bursitis of left shoulder: Secondary | ICD-10-CM | POA: Insufficient documentation

## 2018-11-04 ENCOUNTER — Ambulatory Visit: Payer: BLUE CROSS/BLUE SHIELD | Admitting: Family Medicine

## 2018-12-06 ENCOUNTER — Ambulatory Visit: Payer: Self-pay | Admitting: Orthopedic Surgery

## 2018-12-06 MED ORDER — ACETAMINOPHEN 500 MG PO TABS
1000.0000 mg | ORAL_TABLET | Freq: Once | ORAL | Status: AC
  Administered 2018-12-07: 1000 mg via ORAL

## 2018-12-06 MED ORDER — CHLORHEXIDINE GLUCONATE 4 % EX LIQD: 60.0000 mL | Freq: Once | CUTANEOUS | Status: DC

## 2018-12-06 MED ORDER — CEFAZOLIN SODIUM-DEXTROSE 2-4 GM/100ML-% IV SOLN
2.0000 g | INTRAVENOUS | Status: AC
  Administered 2018-12-07: 2 g via INTRAVENOUS

## 2018-12-07 ENCOUNTER — Ambulatory Visit
Admission: RE | Admit: 2018-12-07 | Discharge: 2018-12-07 | Disposition: A | Discharge status: Home / Self Care | Attending: Orthopedic Surgery | Admitting: Orthopedic Surgery

## 2018-12-07 ENCOUNTER — Other Ambulatory Visit: Payer: Self-pay

## 2018-12-07 ENCOUNTER — Ambulatory Visit: Admitting: Certified Registered"

## 2018-12-07 ENCOUNTER — Encounter
Admission: RE | Disposition: A | Discharge status: Home / Self Care | Payer: Self-pay | Source: Home / Self Care | Attending: Orthopedic Surgery

## 2018-12-07 DIAGNOSIS — Z9889 Other specified postprocedural states: Secondary | ICD-10-CM | POA: Diagnosis not present

## 2018-12-07 DIAGNOSIS — E1122 Type 2 diabetes mellitus with diabetic chronic kidney disease: Secondary | ICD-10-CM | POA: Insufficient documentation

## 2018-12-07 DIAGNOSIS — M7502 Adhesive capsulitis of left shoulder: Secondary | ICD-10-CM | POA: Diagnosis not present

## 2018-12-07 DIAGNOSIS — M65812 Other synovitis and tenosynovitis, left shoulder: Secondary | ICD-10-CM | POA: Diagnosis not present

## 2018-12-07 DIAGNOSIS — M25612 Stiffness of left shoulder, not elsewhere classified: Secondary | ICD-10-CM | POA: Insufficient documentation

## 2018-12-07 DIAGNOSIS — Z7982 Long term (current) use of aspirin: Secondary | ICD-10-CM | POA: Insufficient documentation

## 2018-12-07 DIAGNOSIS — N183 Chronic kidney disease, stage 3 (moderate): Secondary | ICD-10-CM | POA: Insufficient documentation

## 2018-12-07 DIAGNOSIS — Z79899 Other long term (current) drug therapy: Secondary | ICD-10-CM | POA: Diagnosis not present

## 2018-12-07 DIAGNOSIS — K219 Gastro-esophageal reflux disease without esophagitis: Secondary | ICD-10-CM | POA: Insufficient documentation

## 2018-12-07 DIAGNOSIS — M7552 Bursitis of left shoulder: Secondary | ICD-10-CM | POA: Diagnosis not present

## 2018-12-07 DIAGNOSIS — I129 Hypertensive chronic kidney disease with stage 1 through stage 4 chronic kidney disease, or unspecified chronic kidney disease: Secondary | ICD-10-CM | POA: Diagnosis not present

## 2018-12-07 DIAGNOSIS — Z7984 Long term (current) use of oral hypoglycemic drugs: Secondary | ICD-10-CM | POA: Diagnosis not present

## 2018-12-07 DIAGNOSIS — M25812 Other specified joint disorders, left shoulder: Secondary | ICD-10-CM | POA: Diagnosis not present

## 2018-12-07 HISTORY — PX: SHOULDER CLOSED REDUCTION: SHX1051

## 2018-12-07 HISTORY — PX: SHOULDER ARTHROSCOPY WITH ROTATOR CUFF REPAIR AND SUBACROMIAL DECOMPRESSION: SHX5686

## 2018-12-07 LAB — URINE DRUG SCREEN, QUALITATIVE (ARMC ONLY)
Amphetamines, Ur Screen: NOT DETECTED
Barbiturates, Ur Screen: NOT DETECTED
Benzodiazepine, Ur Scrn: NOT DETECTED
Cannabinoid 50 Ng, Ur ~~LOC~~: NOT DETECTED
Cocaine Metabolite,Ur ~~LOC~~: NOT DETECTED
MDMA (Ecstasy)Ur Screen: NOT DETECTED
Methadone Scn, Ur: NOT DETECTED
Opiate, Ur Screen: NOT DETECTED
Phencyclidine (PCP) Ur S: NOT DETECTED
Tricyclic, Ur Screen: NOT DETECTED

## 2018-12-07 LAB — GLUCOSE, CAPILLARY
Glucose-Capillary: 93 mg/dL (ref 70–99)
Glucose-Capillary: 109 mg/dL — ABNORMAL HIGH (ref 70–99)

## 2018-12-07 SURGERY — SHOULDER ARTHROSCOPY WITH ROTATOR CUFF REPAIR AND SUBACROMIAL DECOMPRESSION
Anesthesia: General | Laterality: Left

## 2018-12-07 MED ORDER — DOCUSATE SODIUM 100 MG PO CAPS
100.0000 mg | ORAL_CAPSULE | Freq: Two times a day (BID) | ORAL | Status: DC
  Filled 2018-12-07: qty 1

## 2018-12-07 MED ORDER — BUPIVACAINE-EPINEPHRINE (PF) 0.25% -1:200000 IJ SOLN
INTRAMUSCULAR | Status: DC | PRN
  Administered 2018-12-07: 23 mL

## 2018-12-07 MED ORDER — OXYCODONE-ACETAMINOPHEN 10-325 MG PO TABS: 1.0000 | ORAL_TABLET | Freq: Four times a day (QID) | ORAL | 0 refills | Status: DC | PRN

## 2018-12-07 MED ORDER — FENTANYL CITRATE (PF) 100 MCG/2ML IJ SOLN
25.0000 ug | INTRAMUSCULAR | Status: AC | PRN
  Administered 2018-12-07 (×6): 25 ug via INTRAVENOUS

## 2018-12-07 MED ORDER — FENTANYL CITRATE (PF) 100 MCG/2ML IJ SOLN
INTRAMUSCULAR | Status: DC | PRN
  Administered 2018-12-07: 100 ug via INTRAVENOUS
  Administered 2018-12-07: 50 ug via INTRAVENOUS

## 2018-12-07 MED ORDER — ROCURONIUM BROMIDE 100 MG/10ML IV SOLN
INTRAVENOUS | Status: DC | PRN
  Administered 2018-12-07: 10 mg via INTRAVENOUS
  Administered 2018-12-07: 30 mg via INTRAVENOUS

## 2018-12-07 MED ORDER — ACETAMINOPHEN 325 MG PO TABS: 325.0000 mg | ORAL_TABLET | Freq: Four times a day (QID) | ORAL | Status: DC | PRN

## 2018-12-07 MED ORDER — PROPOFOL 10 MG/ML IV BOLUS
INTRAVENOUS | Status: AC
  Filled 2018-12-07: qty 20

## 2018-12-07 MED ORDER — PROPOFOL 10 MG/ML IV BOLUS
INTRAVENOUS | Status: DC | PRN
  Administered 2018-12-07: 150 mg via INTRAVENOUS

## 2018-12-07 MED ORDER — MIDAZOLAM HCL 2 MG/2ML IJ SOLN
INTRAMUSCULAR | Status: AC
  Filled 2018-12-07: qty 2

## 2018-12-07 MED ORDER — SUGAMMADEX SODIUM 200 MG/2ML IV SOLN
INTRAVENOUS | Status: DC | PRN
  Administered 2018-12-07: 137 mg via INTRAVENOUS

## 2018-12-07 MED ORDER — LACTATED RINGERS IV SOLN: INTRAVENOUS | Status: DC

## 2018-12-07 MED ORDER — ONDANSETRON HCL 4 MG PO TABS: 4.0000 mg | ORAL_TABLET | Freq: Four times a day (QID) | ORAL | Status: DC | PRN

## 2018-12-07 MED ORDER — ROPIVACAINE HCL 5 MG/ML IJ SOLN
INTRAMUSCULAR | Status: AC
  Filled 2018-12-07: qty 20

## 2018-12-07 MED ORDER — ONDANSETRON HCL 4 MG/2ML IJ SOLN
INTRAMUSCULAR | Status: DC | PRN
  Administered 2018-12-07: 4 mg via INTRAVENOUS

## 2018-12-07 MED ORDER — TRIAMCINOLONE ACETONIDE 40 MG/ML IJ SUSP
INTRAMUSCULAR | Status: AC
  Filled 2018-12-07: qty 1

## 2018-12-07 MED ORDER — LACTATED RINGERS IV SOLN
INTRAVENOUS | Status: DC | PRN
  Administered 2018-12-07: 3000 mL

## 2018-12-07 MED ORDER — FENTANYL CITRATE (PF) 100 MCG/2ML IJ SOLN
INTRAMUSCULAR | Status: AC
  Administered 2018-12-07: 25 ug via INTRAVENOUS
  Filled 2018-12-07: qty 2

## 2018-12-07 MED ORDER — ONDANSETRON HCL 4 MG/2ML IJ SOLN: 4.0000 mg | Freq: Four times a day (QID) | INTRAMUSCULAR | Status: DC | PRN

## 2018-12-07 MED ORDER — ACETAMINOPHEN 500 MG PO TABS
ORAL_TABLET | ORAL | Status: AC
  Administered 2018-12-07: 1000 mg via ORAL
  Filled 2018-12-07: qty 2

## 2018-12-07 MED ORDER — SODIUM CHLORIDE 0.9 % IV SOLN
INTRAVENOUS | Status: DC
  Administered 2018-12-07: 12:00:00 via INTRAVENOUS

## 2018-12-07 MED ORDER — LIDOCAINE HCL (CARDIAC) PF 100 MG/5ML IV SOSY
PREFILLED_SYRINGE | INTRAVENOUS | Status: DC | PRN
  Administered 2018-12-07: 100 mg via INTRAVENOUS

## 2018-12-07 MED ORDER — OXYCODONE-ACETAMINOPHEN 5-325 MG PO TABS
ORAL_TABLET | ORAL | Status: AC
  Administered 2018-12-07: 1 via ORAL
  Filled 2018-12-07: qty 1

## 2018-12-07 MED ORDER — HYDROCODONE-ACETAMINOPHEN 5-325 MG PO TABS: 1.0000 | ORAL_TABLET | ORAL | Status: DC | PRN

## 2018-12-07 MED ORDER — METOCLOPRAMIDE HCL 10 MG PO TABS: 5.0000 mg | ORAL_TABLET | Freq: Three times a day (TID) | ORAL | Status: DC | PRN

## 2018-12-07 MED ORDER — TRIAMCINOLONE ACETONIDE 40 MG/ML IJ SUSP
INTRAMUSCULAR | Status: DC | PRN
  Administered 2018-12-07: 80 mg

## 2018-12-07 MED ORDER — PHENOL 1.4 % MT LIQD
1.0000 | OROMUCOSAL | Status: DC | PRN
  Filled 2018-12-07: qty 177

## 2018-12-07 MED ORDER — FENTANYL CITRATE (PF) 100 MCG/2ML IJ SOLN
INTRAMUSCULAR | Status: AC
  Filled 2018-12-07: qty 2

## 2018-12-07 MED ORDER — BUPIVACAINE-EPINEPHRINE (PF) 0.25% -1:200000 IJ SOLN
INTRAMUSCULAR | Status: AC
  Filled 2018-12-07: qty 30

## 2018-12-07 MED ORDER — LACTATED RINGERS IV SOLN
INTRAVENOUS | Status: DC | PRN
  Administered 2018-12-07: 13:00:00 via INTRAVENOUS

## 2018-12-07 MED ORDER — ROPIVACAINE HCL 5 MG/ML IJ SOLN
INTRAMUSCULAR | Status: DC | PRN
  Administered 2018-12-07: 18 mL

## 2018-12-07 MED ORDER — HYDROCODONE-ACETAMINOPHEN 7.5-325 MG PO TABS
1.0000 | ORAL_TABLET | ORAL | Status: DC | PRN
  Filled 2018-12-07: qty 2

## 2018-12-07 MED ORDER — METOCLOPRAMIDE HCL 5 MG/ML IJ SOLN: 5.0000 mg | Freq: Three times a day (TID) | INTRAMUSCULAR | Status: DC | PRN

## 2018-12-07 MED ORDER — MORPHINE SULFATE (PF) 4 MG/ML IV SOLN: 0.5000 mg | INTRAVENOUS | Status: DC | PRN

## 2018-12-07 MED ORDER — MIDAZOLAM HCL 2 MG/2ML IJ SOLN
INTRAMUSCULAR | Status: DC | PRN
  Administered 2018-12-07: 2 mg via INTRAVENOUS

## 2018-12-07 MED ORDER — KETOROLAC TROMETHAMINE 15 MG/ML IJ SOLN
15.0000 mg | Freq: Four times a day (QID) | INTRAMUSCULAR | Status: DC
  Administered 2018-12-07: 15 mg via INTRAVENOUS
  Filled 2018-12-07: qty 1

## 2018-12-07 MED ORDER — ONDANSETRON HCL 4 MG/2ML IJ SOLN: 4.0000 mg | Freq: Once | INTRAMUSCULAR | Status: DC | PRN

## 2018-12-07 MED ORDER — OXYCODONE-ACETAMINOPHEN 5-325 MG PO TABS
1.0000 | ORAL_TABLET | Freq: Once | ORAL | Status: AC
  Administered 2018-12-07: 1 via ORAL

## 2018-12-07 MED ORDER — CEFAZOLIN SODIUM-DEXTROSE 2-4 GM/100ML-% IV SOLN
INTRAVENOUS | Status: AC
  Filled 2018-12-07: qty 100

## 2018-12-07 MED ORDER — MENTHOL 3 MG MT LOZG
1.0000 | LOZENGE | OROMUCOSAL | Status: DC | PRN
  Filled 2018-12-07: qty 9

## 2018-12-07 MED ORDER — KETOROLAC TROMETHAMINE 15 MG/ML IJ SOLN
INTRAMUSCULAR | Status: AC
  Administered 2018-12-07: 15 mg via INTRAVENOUS
  Filled 2018-12-07: qty 1

## 2018-12-07 MED ORDER — DEXAMETHASONE SODIUM PHOSPHATE 10 MG/ML IJ SOLN
INTRAMUSCULAR | Status: DC | PRN
  Administered 2018-12-07: 8 mg via INTRAVENOUS

## 2018-12-07 MED ORDER — EPINEPHRINE 30 MG/30ML IJ SOLN
INTRAMUSCULAR | Status: AC
  Filled 2018-12-07: qty 1

## 2018-12-07 MED ORDER — FAMOTIDINE 20 MG PO TABS
ORAL_TABLET | ORAL | Status: AC
  Filled 2018-12-07: qty 1

## 2018-12-07 SURGICAL SUPPLY — 51 items
ADAPTER IRRIG TUBE 2 SPIKE SOL (ADAPTER) ×4 IMPLANT
BLADE FULL RADIUS 3.5 (BLADE) ×2 IMPLANT
BLADE INCISOR PLUS 4.5 (BLADE) ×2 IMPLANT
BLADE SURG MINI STRL (BLADE) IMPLANT
BRUSH SCRUB EZ  4% CHG (MISCELLANEOUS) ×1
BRUSH SCRUB EZ 4% CHG (MISCELLANEOUS) ×1 IMPLANT
BUR ACROMIONIZER 4.0 (BURR) IMPLANT
BUR BR 5.5 WIDE MOUTH (BURR) ×2 IMPLANT
CANNULA 5.75X7 CRYSTAL CLEAR (CANNULA) ×2 IMPLANT
CANNULA PARTIAL THREAD 2X7 (CANNULA) IMPLANT
CANNULA SHOULDER 7CM (CANNULA) ×2 IMPLANT
CANNULA TWIST IN 8.25X9CM (CANNULA) IMPLANT
COOLER POLAR GLACIER W/PUMP (MISCELLANEOUS) ×2 IMPLANT
COVER WAND RF STERILE (DRAPES) ×2 IMPLANT
CRADLE LAMINECT ARM (MISCELLANEOUS) ×4 IMPLANT
DRAPE IMP U-DRAPE 54X76 (DRAPES) ×4 IMPLANT
DRAPE INCISE IOBAN 66X45 STRL (DRAPES) ×2 IMPLANT
DRAPE SHEET LG 3/4 BI-LAMINATE (DRAPES) ×2 IMPLANT
DRAPE STERI 35X30 U-POUCH (DRAPES) ×2 IMPLANT
DRAPE U-SHAPE 47X51 STRL (DRAPES) ×2 IMPLANT
GAUZE PETRO XEROFOAM 1X8 (MISCELLANEOUS) ×2 IMPLANT
GAUZE SPONGE 4X4 12PLY STRL (GAUZE/BANDAGES/DRESSINGS) ×2 IMPLANT
GLOVE BIOGEL PI IND STRL 8 (GLOVE) ×1 IMPLANT
GLOVE BIOGEL PI INDICATOR 8 (GLOVE) ×1
GLOVE SURG ORTHO 8.0 STRL STRW (GLOVE) ×2 IMPLANT
GOWN STRL REUS W/ TWL LRG LVL3 (GOWN DISPOSABLE) ×2 IMPLANT
GOWN STRL REUS W/TWL LRG LVL3 (GOWN DISPOSABLE) ×2
IV LACTATED RINGER IRRG 3000ML (IV SOLUTION) ×4
IV LR IRRIG 3000ML ARTHROMATIC (IV SOLUTION) ×4 IMPLANT
KIT STABILIZATION SHOULDER (MISCELLANEOUS) ×2 IMPLANT
KIT TURNOVER KIT A (KITS) ×2 IMPLANT
MANIFOLD NEPTUNE II (INSTRUMENTS) ×2 IMPLANT
MASK FACE SPIDER DISP (MASK) ×2 IMPLANT
MAT ABSORB  FLUID 56X50 GRAY (MISCELLANEOUS) ×2
MAT ABSORB FLUID 56X50 GRAY (MISCELLANEOUS) ×2 IMPLANT
NEEDLE HYPO 22GX1.5 SAFETY (NEEDLE) ×2 IMPLANT
NEEDLE SPNL 18GX3.5 QUINCKE PK (NEEDLE) ×2 IMPLANT
PACK ARTHROSCOPY SHOULDER (MISCELLANEOUS) ×2 IMPLANT
PAD ABD DERMACEA PRESS 5X9 (GAUZE/BANDAGES/DRESSINGS) IMPLANT
PAD WRAPON POLAR SHDR XLG (MISCELLANEOUS) ×1 IMPLANT
SLING ARM LRG DEEP (SOFTGOODS) ×4 IMPLANT
SLING ULTRA II LG (MISCELLANEOUS) ×2 IMPLANT
STRAP SAFETY 5IN WIDE (MISCELLANEOUS) ×2 IMPLANT
SUT ETHILON NAB PS2 4-0 18IN (SUTURE) ×4 IMPLANT
SYR 10ML LL (SYRINGE) ×2 IMPLANT
SYR 50ML LL SCALE MARK (SYRINGE) ×2 IMPLANT
TAPE MICROFOAM 4IN (TAPE) ×2 IMPLANT
TUBING ARTHRO INFLOW-ONLY STRL (TUBING) ×2 IMPLANT
TUBING CONNECTING 10 (TUBING) ×2 IMPLANT
WAND HAND CNTRL MULTIVAC 90 (MISCELLANEOUS) ×2 IMPLANT
WRAPON POLAR PAD SHDR XLG (MISCELLANEOUS) ×2

## 2018-12-07 NOTE — Anesthesia Post-op Follow-up Note (Signed)
Anesthesia QCDR form completed.        

## 2018-12-07 NOTE — Anesthesia Postprocedure Evaluation (Signed)
Anesthesia Post Note  Patient: Brandy CheeksLinda D Johnston  Procedure(s) Performed: left shoulder manipulation under anesthesia, left shoulder arthroscopic lysis of adhesions (Left ) CLOSED MANIPULATION SHOULDER (Left )  Patient location during evaluation: PACU Anesthesia Type: General Level of consciousness: awake and alert Pain management: pain level controlled Vital Signs Assessment: post-procedure vital signs reviewed and stable Respiratory status: spontaneous breathing and respiratory function stable Cardiovascular status: stable Anesthetic complications: no     Last Vitals:  Vitals:   12/07/18 1143 12/07/18 1437  BP: 116/74 123/67  Pulse: 76 89  Resp: 18 16  Temp: 36.4 C (!) 36 C  SpO2: 100% 100%    Last Pain:  Vitals:   12/07/18 1437  TempSrc:   PainSc: Asleep                 KEPHART,WILLIAM K

## 2018-12-07 NOTE — Discharge Instructions (Signed)
AMBULATORY SURGERY  DISCHARGE INSTRUCTIONS   1) The drugs that you were given will stay in your system until tomorrow so for the next 24 hours you should not:  A) Drive an automobile B) Make any legal decisions C) Drink any alcoholic beverage   2) You may resume regular meals tomorrow.  Today it is better to start with liquids and gradually work up to solid foods.  You may eat anything you prefer, but it is better to start with liquids, then soup and crackers, and gradually work up to solid foods.   3) Please notify your doctor immediately if you have any unusual bleeding, trouble breathing, redness and pain at the surgery site, drainage, fever, or pain not relieved by medication. 4)   5) Your post-operative visit with Dr.                                     is: Date:                        Time:    Please call to schedule your post-operative visit.  6) Additional Instructions:     Wear sling at all times, including sleep.  You will need to use the sling for a total of 4 weeks following surgery.  Do not try and lift anything with your operative side.  Keep the dressing dry.  You may remove bandage in 3 days.  You may place Band-Aids over top of the incisions.  May shower once dressing is removed in 3 days.  Remove sling carefully for showers, leaving arm down by your side while in the shower.  +++ Make sure to take some pain medication this evening before you fall asleep, in preparation for the nerve block wearing off in the middle of the night.  If the the pain medication causes itching, or is too strong, try taking a single tablet at a time, or combining with Benadryl.  You may be most comfortable sleeping in a recliner.  If you do sleep in near bed, placed pillows behind the shoulder that have the operation to support it.

## 2018-12-07 NOTE — Anesthesia Preprocedure Evaluation (Signed)
Anesthesia Evaluation  Patient identified by MRN, date of birth, ID band Patient awake    Reviewed: Allergy & Precautions, NPO status , Patient's Chart, lab work & pertinent test results  History of Anesthesia Complications Negative for: history of anesthetic complications  Airway Mallampati: III       Dental   Pulmonary neg sleep apnea, neg COPD,           Cardiovascular hypertension, Pt. on medications (-) Past MI and (-) CHF (-) dysrhythmias (-) Valvular Problems/Murmurs     Neuro/Psych neg Seizures    GI/Hepatic GERD  Medicated,  Endo/Other  diabetes, Type 2, Oral Hypoglycemic Agents  Renal/GU Renal InsufficiencyRenal disease     Musculoskeletal   Abdominal   Peds  Hematology  (+) anemia ,   Anesthesia Other Findings   Reproductive/Obstetrics                             Anesthesia Physical Anesthesia Plan  ASA: III  Anesthesia Plan: General   Post-op Pain Management:    Induction: Intravenous  PONV Risk Score and Plan: 3 and Dexamethasone, Ondansetron and Midazolam  Airway Management Planned: Oral ETT  Additional Equipment:   Intra-op Plan:   Post-operative Plan:   Informed Consent: I have reviewed the patients History and Physical, chart, labs and discussed the procedure including the risks, benefits and alternatives for the proposed anesthesia with the patient or authorized representative who has indicated his/her understanding and acceptance.     Plan Discussed with:   Anesthesia Plan Comments:         Anesthesia Quick Evaluation

## 2018-12-07 NOTE — Anesthesia Procedure Notes (Signed)
Procedure Name: Intubation Date/Time: 12/07/2018 12:58 PM Performed by: Rona Ravens, CRNA Pre-anesthesia Checklist: Patient identified, Emergency Drugs available, Suction available and Patient being monitored Patient Re-evaluated:Patient Re-evaluated prior to induction Oxygen Delivery Method: Circle system utilized Preoxygenation: Pre-oxygenation with 100% oxygen Induction Type: IV induction Ventilation: Mask ventilation without difficulty Laryngoscope Size: Mac and 3 Grade View: Grade II Tube type: Oral Tube size: 7.0 mm Number of attempts: 1 Airway Equipment and Method: Stylet Placement Confirmation: ETT inserted through vocal cords under direct vision,  positive ETCO2 and breath sounds checked- equal and bilateral Secured at: 22 cm Tube secured with: Tape Dental Injury: Teeth and Oropharynx as per pre-operative assessment

## 2018-12-07 NOTE — Op Note (Signed)
12/07/2018  2:37 PM  PATIENT:  Brandy Johnston    PRE-OPERATIVE DIAGNOSIS:  HISTORY OF ARTHROSCOPIC PROCEDURE ON SHOULDER, STIFFNESS OF LEFT SHOULDER  POST-OPERATIVE DIAGNOSIS:  Same  PROCEDURE:   left shoulder arthroscopic lysis of adhesions, CLOSED MANIPULATION SHOULDER under anesthesia  SURGEON:  Lovell Sheehan, MD  ANESTHESIA:   General  PREOPERATIVE INDICATIONS:  Brandy Johnston is a  57 y.o. female with a diagnosis of HISTORY OF ARTHROSCOPIC PROCEDURE ON SHOULDER, STIFFNESS OF LEFT SHOULDER who failed conservative measures and elected for surgical management.    I discussed the risks and benefits of surgery. The risks include but are not limited to infection, bleeding requiring blood transfusion, nerve or blood vessel injury, joint stiffness or loss of motion, persistent pain, weakness or instability, malunion, nonunion and hardware failure and the need for further surgery. Medical risks include but are not limited to DVT and pulmonary embolism, myocardial infarction, stroke, pneumonia, respiratory failure and death. Patient understood these risks and wished to proceed.   OPERATIVE FINDINGS: Extensive synovitis was noted within the glenohumeral joint with capsular thickening. Bursitis was identified within the subacromial space. Calcification was noted on the anterior acromion and distal clavicle.  OPERATIVE PROCEDURE: The patient was met in the preoperative area. The left shoulder was signed with the word yes and my initials according the hospital's correct site of surgery protocol.  History and physical was updated. Patient was brought to the operating room where he underwent interscalene block and general anesthesia. The patient was placed in a beachchair position.  A spider arm positioner was used for this case. Examination under anesthesia revealed no instability with load shift testing. The patient had a negative sulcus sign. Forward flexion and abduction were limited to 110 degrees. A  manipulation was performed and full motion was achieved.  Patient was prepped and draped in a sterile fashion. A timeout was performed to verify the patient's name, date of birth, medical record number, correct site of surgery and correct procedure to be performed there was also used to verify the patient received antibiotics that all appropriate instruments, implants and radiographs studies were available in the room. Once all in attendance were in agreement case began.  Bony landmarks were drawn out with a surgical marker along with proposed arthroscopy incisions. These were pre-injected with 1% lidocaine plain. An 11 blade was used to establish a posterior portal through which the arthroscope was placed in the glenohumeral joint. A full diagnostic examination of the shoulder was performed. Please see findings for a complete report. The degenerative fraying of the labrum was debrided with a shaver and the synovitis removed with electrocautery and the shaver. The rotator interval was opened with the cautery.  The arthroscope was then placed in the subacromial space.  Extensive bursitis was encountered. A lateral portal was established with an 18-gauge spinal needle for localization. A 90 [employer] wand and shaver blade were used to form an extensive subdeltoid and subacromial bursectomy. There was no evidence of a bursal sided tear of the supra or infraspinatus.   Anterior calcification of the acromion was removed with the burr.  The distal clavicle was evaluated and calcifications were also removed with the burr. Subacromial space was then copiously irrigated to remove all osseous debris. Final arthroscopic images were taken.  Skin closure for the arthroscopic incisions was performed with 4-0 nylon.   0.5% Ropivicaine and Kenalog was then injected into the subacromial space for postoperative pain control. A dry sterile dressing was applied.  The patient was placed in a sling.  All sharp and it  instrument counts were correct at the conclusion of the case. I was scrubbed and present for the entire case. I spoke with the patient's family postoperatively to let them know the case had been performed without complication and the patient was stable in recovery room.   Brandy Johnston. Brandy Mares, MD

## 2018-12-07 NOTE — H&P (Signed)
The patient has been re-examined, and the chart reviewed, and there have been no interval changes to the documented history and physical.  Plan a left shoulder arthroscopic lysis of adhesions and manipulation today.  Anesthesia is not consulted regarding a peripheral nerve block for post-operative pain.  The risks, benefits, and alternatives have been discussed at length, and the patient is willing to proceed.

## 2018-12-07 NOTE — Transfer of Care (Signed)
Immediate Anesthesia Transfer of Care Note  Patient: Osie CheeksLinda D Coulon  Procedure(s) Performed: left shoulder manipulation under anesthesia, left shoulder arthroscopic lysis of adhesions (Left ) CLOSED MANIPULATION SHOULDER (Left )  Patient Location: PACU  Anesthesia Type:General  Level of Consciousness: awake and alert   Airway & Oxygen Therapy: Patient connected to face mask  Post-op Assessment: Report given to RN  Post vital signs: stable  Last Vitals:  Vitals Value Taken Time  BP 123/67 12/07/2018  2:37 PM  Temp 36 C 12/07/2018  2:37 PM  Pulse 88 12/07/2018  2:40 PM  Resp 16 12/07/2018  2:40 PM  SpO2 100 % 12/07/2018  2:40 PM  Vitals shown include unvalidated device data.  Last Pain:  Vitals:   12/07/18 1437  TempSrc:   PainSc: Asleep         Complications: No apparent anesthesia complications

## 2018-12-19 NOTE — H&P (Signed)
PREOPERATIVE H&P  Chief Complaint: HISTORY OF ARTHROSCOPIC PROCEDURE ON SHOULDER, STIFFNESS OF LEFT SHOULDER  HPI: Brandy Johnston is a 57 y.o. female who presents for preoperative history and physical with a diagnosis of HISTORY OF ARTHROSCOPIC PROCEDURE ON SHOULDER, STIFFNESS OF LEFT SHOULDER. Symptoms are rated as moderate to severe, and have been worsening.  This is significantly impairing activities of daily living.  She has elected for surgical management.   Past Medical History:  Diagnosis Date  . Anemia   . Chronic renal impairment, stage 3 (moderate) (HCC)   . Diabetes mellitus without complication (HCC)    No longer on meds  . GERD (gastroesophageal reflux disease)   . Hyperlipidemia   . Hypertension   . Hypokalemia   . Insomnia   . Low calcium levels   . Osteoarthrosis, hip    right hip  . Paresthesia   . Sickle cell anemia (HCC)   . Sickle-cell trait (HCC)   . Tension headache    1x/mo  . Wears contact lenses    Past Surgical History:  Procedure Laterality Date  . CHOLECYSTECTOMY    . COLONOSCOPY    . ESOPHAGOGASTRODUODENOSCOPY (EGD) WITH PROPOFOL N/A 10/05/2016   Procedure: ESOPHAGOGASTRODUODENOSCOPY (EGD) WITH PROPOFOL;  Surgeon: Midge Miniumarren Wohl, MD;  [facility];  Service: Endoscopy;  Laterality: N/A;  . FRACTURE SURGERY Left    cast and pins   . SHOULDER ARTHROSCOPY WITH ROTATOR CUFF REPAIR AND SUBACROMIAL DECOMPRESSION Left 12/07/2018   Procedure: left shoulder manipulation under anesthesia, left shoulder arthroscopic lysis of adhesions;  Surgeon: Lyndle HerrlichBowers, TRUE Garciamartinez R, MD;  Location: [facility];  Service: Orthopedics;  Laterality: Left;  . SHOULDER CLOSED REDUCTION Left 12/07/2018   Procedure: CLOSED MANIPULATION SHOULDER;  Surgeon: Lyndle HerrlichBowers, Saylor Murry R, MD;  Location: [facility];  Service: Orthopedics;  Laterality: Left;  . shoulder surgery  Left 06/10/2018   Dr. Derryl HarborBower  . TUBAL LIGATION     Social History   Socioeconomic History  . Marital status: Single     Spouse name: Not on file  . Number of children: 2  . Years of education: Not on file  . Highest education level: Not on file  Occupational History  . Not on file  Social Needs  . Financial resource strain: Not very hard  . Food insecurity:    Worry: Never true    Inability: Never true  . Transportation needs:    Medical: No    Non-medical: No  Tobacco Use  . Smoking status: Never Smoker  . Smokeless tobacco: Never Used  Substance and Sexual Activity  . Alcohol use: Yes    Alcohol/week: 0.0 standard drinks    Comment: rare  . Drug use: Yes    Types: Marijuana    Comment: smokes marijuana occasionally  . Sexual activity: Yes    Partners: Female    Birth control/protection: Other-see comments  Lifestyle  . Physical activity:    Days per week: 0 days    Minutes per session: 0 min  . Stress: Only a little  Relationships  . Social connections:    Talks on phone: More than three times a week    Gets together: More than three times a week    Attends religious service: Never    Active member of club or organization: No    Attends meetings of clubs or organizations: Never    Relationship status: Never married  Other Topics Concern  . 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 Migraines Mother   . Diabetes Mother   . Cancer Mother        lung  . Arthritis Brother   . Breast cancer Maternal Aunt   . Cancer Maternal Uncle        Lung and Colon  . Cirrhosis Brother   . Breast cancer Cousin    No Known Allergies Prior to Admission medications   Medication Sig Start Date End Date Taking? Authorizing Provider  aspirin (ASPIRIN 81) 81 MG chewable tablet Chew 1 tablet (81 mg total) by mouth daily. 05/03/18   Yes Sowles, Danna HeftyKrichna, MD  atorvastatin (LIPITOR) 20 MG tablet Take 1 tablet (20 mg total) by mouth daily. 05/03/18  Yes Sowles, Danna HeftyKrichna, MD  Butalbital-APAP-Caffeine 50-300-40 MG CAPS Take 1 capsule by mouth every 4 (four) hours as needed. Patient taking differently: Take 1 capsule by mouth every 4 (four) hours as needed (migraines).  09/28/18  Yes Sowles, Danna HeftyKrichna, MD  Cholecalciferol (VITAMIN D) 2000 units CAPS Take 1 capsule (2,000 Units total) by mouth daily. 04/20/17  Yes Sowles, Danna HeftyKrichna, MD  cyclobenzaprine (FLEXERIL) 5 MG tablet Take 1-2 tablets (5-10 mg total) by mouth 2 (two) times daily. One in am and 2 in pm 09/28/18  Yes Sowles, Danna HeftyKrichna, MD  gabapentin (NEURONTIN) 300 MG capsule Take 1 capsule (300 mg total) by mouth 2 (two) times daily. 09/28/18  Yes Alba CorySowles, Krichna, MD  Magnesium Oxide 400 MG CAPS Take 1 capsule (400 mg total) by mouth 2 (two) times daily. 09/28/18  Yes Sowles, Danna HeftyKrichna, MD  omeprazole (PRILOSEC) 20 MG capsule Take 1 capsule (20 mg total) by mouth daily. 09/28/18  Yes Sowles, Danna HeftyKrichna, MD  OZEMPIC, 0.25 OR 0.5 MG/DOSE, 2 MG/1.5ML SOPN INJECT 0.5 MG UNDER THE SKIN EVERY WEEK Patient taking differently: Inject 0.5 mg into the muscle every Monday.  09/22/18  Yes Sowles, Danna HeftyKrichna, MD  promethazine (PHENERGAN) 12.5 MG tablet Take 1 tablet (12.5 mg total) by mouth every 8 (eight) hours as needed for nausea or vomiting. 09/28/18  Yes Sowles, Danna HeftyKrichna, MD  RA COL-RITE 100 MG capsule Take 100 mg by mouth 2 (two) times daily as needed for mild constipation.  06/14/18  Yes [provider]  temazepam (RESTORIL) 15 MG capsule Take 1 capsule (15 mg total) by mouth as needed. Patient taking differently: Take 15 mg by mouth at bedtime as needed for sleep.  09/28/18  Yes Sowles, Danna HeftyKrichna, MD  valsartan (DIOVAN) 160 MG tablet Take 1 tablet (160 mg total) by mouth daily. 09/28/18  Yes Sowles, Danna HeftyKrichna, MD  oxyCODONE-acetaminophen (PERCOCET) 10-325 MG tablet Take 1 tablet by mouth every 6 (six) hours  as needed for pain. 12/07/18 12/07/19  Lyndle HerrlichBowers, Devarius Nelles R, MD     Positive ROS: All other systems have been reviewed and were otherwise negative with the exception of those mentioned in the HPI and as above.  Physical Exam: General: Alert, no acute distress Cardiovascular: Regular rate and rhythm, no murmurs rubs or gallops.  No pedal edema Respiratory: Clear to auscultation bilaterally, no wheezes rales or rhonchi. No cyanosis, no use of accessory musculature GI: No organomegaly, abdomen is soft and non-tender nondistended with positive bowel sounds. Skin: Skin intact, no lesions within the  operative field. Neurologic: Sensation intact distally Psychiatric: Patient is competent for consent with normal mood and affect Lymphatic: No axillary or cervical lymphadenopathy  MUSCULOSKELETAL: left shoulder with decreased ROM, painful ROM  Assessment: HISTORY OF ARTHROSCOPIC PROCEDURE ON SHOULDER, STIFFNESS OF LEFT SHOULDER  Plan: Plan for Procedure(s): left shoulder manipulation under anesthesia, left shoulder arthroscopic lysis of adhesions CLOSED MANIPULATION SHOULDER  I discussed the risks and benefits of surgery. The risks include but are not limited to infection, bleeding requiring blood transfusion, nerve or blood vessel injury, joint stiffness or loss of motion, persistent pain, weakness or instability, malunion, nonunion and hardware failure and the need for further surgery. Medical risks include but are not limited to DVT and pulmonary embolism, myocardial infarction, stroke, pneumonia, respiratory failure and death. Patient understood these risks and wished to proceed.   Lyndle Herrlich, MD   12/19/2018 4:03 PM

## 2018-12-23 ENCOUNTER — Other Ambulatory Visit: Payer: Self-pay | Admitting: Family Medicine

## 2018-12-23 DIAGNOSIS — E1142 Type 2 diabetes mellitus with diabetic polyneuropathy: Secondary | ICD-10-CM

## 2018-12-23 DIAGNOSIS — E1122 Type 2 diabetes mellitus with diabetic chronic kidney disease: Secondary | ICD-10-CM

## 2018-12-23 DIAGNOSIS — N183 Chronic kidney disease, stage 3 unspecified: Secondary | ICD-10-CM

## 2018-12-23 MED ORDER — SEMAGLUTIDE(0.25 OR 0.5MG/DOS) 2 MG/1.5ML ~~LOC~~ SOPN: 0.5000 mg | PEN_INJECTOR | SUBCUTANEOUS | 0 refills | Status: DC

## 2018-12-23 NOTE — Telephone Encounter (Signed)
Copied from CRM [id number]. Topic: Quick Communication - Rx Refill/Question >> Dec 23, 2018 11:11 AM Burchel, Abbi R wrote: Medication: OZEMPIC, 0.25 OR 0.5 MG/DOSE, 2 MG/1.5ML SOPN  Preferred Pharmacy[id number] [address] [facility]- [place], [place] - [address] [facility] [address] [place] [id number] Phone: [phone] Fax: [phone]    Pt was  advised that RX refills may take up to 3 business days. We ask that you follow-up with your pharmacy.

## 2018-12-23 NOTE — Addendum Note (Signed)
Addended by: Tommie RaymondBOOKER, CRYSTAL L on: 12/23/2018 12:52 PM   Modules accepted: Orders

## 2018-12-31 ENCOUNTER — Other Ambulatory Visit: Payer: Self-pay | Admitting: Family Medicine

## 2018-12-31 DIAGNOSIS — E785 Hyperlipidemia, unspecified: Secondary | ICD-10-CM

## 2018-12-31 DIAGNOSIS — I700 Atherosclerosis of aorta: Secondary | ICD-10-CM

## 2019-01-09 ENCOUNTER — Ambulatory Visit: Payer: BLUE CROSS/BLUE SHIELD | Admitting: Family Medicine

## 2019-01-09 ENCOUNTER — Encounter: Payer: Self-pay | Admitting: Family Medicine

## 2019-01-09 VITALS — BP 128/84 | HR 92 | Temp 97.9°F | Resp 16 | Ht 63.0 in | Wt 164.9 lb

## 2019-01-09 DIAGNOSIS — G8929 Other chronic pain: Secondary | ICD-10-CM

## 2019-01-09 DIAGNOSIS — I10 Essential (primary) hypertension: Secondary | ICD-10-CM

## 2019-01-09 DIAGNOSIS — G44209 Tension-type headache, unspecified, not intractable: Secondary | ICD-10-CM

## 2019-01-09 DIAGNOSIS — N183 Chronic kidney disease, stage 3 unspecified: Secondary | ICD-10-CM

## 2019-01-09 DIAGNOSIS — E1142 Type 2 diabetes mellitus with diabetic polyneuropathy: Secondary | ICD-10-CM

## 2019-01-09 DIAGNOSIS — M5442 Lumbago with sciatica, left side: Secondary | ICD-10-CM | POA: Diagnosis not present

## 2019-01-09 DIAGNOSIS — E1122 Type 2 diabetes mellitus with diabetic chronic kidney disease: Secondary | ICD-10-CM | POA: Diagnosis not present

## 2019-01-09 DIAGNOSIS — M5441 Lumbago with sciatica, right side: Secondary | ICD-10-CM

## 2019-01-09 DIAGNOSIS — Z9889 Other specified postprocedural states: Secondary | ICD-10-CM

## 2019-01-09 DIAGNOSIS — G4709 Other insomnia: Secondary | ICD-10-CM

## 2019-01-09 LAB — POCT GLYCOSYLATED HEMOGLOBIN (HGB A1C): HbA1c, POC (controlled diabetic range): 5.9 % (ref 0.0–7.0)

## 2019-01-09 LAB — GLUCOSE, POCT (MANUAL RESULT ENTRY): POC Glucose: 82 mg/dl (ref 70–99)

## 2019-01-09 LAB — POCT UA - MICROALBUMIN: Microalbumin Ur, POC: 20 mg/L

## 2019-01-09 MED ORDER — TEMAZEPAM 15 MG PO CAPS: 15.0000 mg | ORAL_CAPSULE | ORAL | 2 refills | Status: DC | PRN

## 2019-01-09 MED ORDER — BUTALBITAL-APAP-CAFFEINE 50-300-40 MG PO CAPS: 1.0000 | ORAL_CAPSULE | ORAL | 0 refills | Status: DC | PRN

## 2019-01-09 MED ORDER — VALSARTAN 160 MG PO TABS: 160.0000 mg | ORAL_TABLET | Freq: Every day | ORAL | 2 refills | Status: DC

## 2019-01-09 MED ORDER — CYCLOBENZAPRINE HCL 5 MG PO TABS: 5.0000 mg | ORAL_TABLET | Freq: Two times a day (BID) | ORAL | 2 refills | Status: DC

## 2019-01-09 MED ORDER — GABAPENTIN 300 MG PO CAPS: 300.0000 mg | ORAL_CAPSULE | Freq: Two times a day (BID) | ORAL | 5 refills | Status: DC

## 2019-01-09 NOTE — Progress Notes (Signed)
Name: Brandy Johnston   MRN: [id number]    DOB: October 12, 1961   Date:01/09/2019       Progress Note  Subjective  Chief Complaint  Chief Complaint  Patient presents with  . Medication Refill  . Diabetes  . Hypertension  . Hyperlipidemia  . Gastroesophageal Reflux  . Migraine    Has had 2 migraines since September  . Insomnia    HPI  DMII with renal manifestation, and neuropathy. She continues to have numbness and tingling on  finger tips and toes are better since she has been magnesium and gabapentin. She has not been checking her glucose levels at home . Denies polyphagia, polydipsia or polyuria. She is offMetformin, and is on Ozempic 0.25 mg daily because higher dose caused nausea and inability to taste food. hgbA1C has improved , down to 5.9%, she does not want to stop medication, she states medication curbs her appetite. She was not compliant with her diet over the holidays and gained 10 lbs since last visit.   Insomnia: She is taking Temazepam prn since not working ( out of working since June 11th for [employer] comp injury), but has resumed taking every night because pain medication given after shoulder surgery has been keeping her up. Discussed sleep hygiene. Explained risk of BZD , flexeril and pain medication causing respiratory depression  HTN: taking medication down to 160 mg daily . She is off Exforge because of dizziness, we went down to 320 but was still having symptoms and is tolerating the 160 mg dose. She has occasionally has palpitation ( seen cardiologist and was given Lopressor but she stopped because it did not improve symptoms) , she is doing better now, bp is at goal  Hyperlipidemia:reviewed labs and last LDL is at goal, but LDL went up , she is now taking it daily, goal LDL is below 70  GERD: she is back on Omeprazolebut states not compliant with diet. No epigastric pain, and is off nsaid's now, but unable to wean self off   Chronic low back pain with lumbar  radiculitis:no recent flares,The left outer thigh and bottom of left foot stays numb. She had NCS and MRI. She does not want to see Dr. Council Mechanic at this time or get PT because of cost, she is on gabapentin and since not working ( had shoulder surgery in June) she is doing better. Advised to only take flexeril prn   Left shoulder surgery : workman's comp, under the care of Dr. Odis Luster and he is currently giving her Tramadol that we previously rx for back pain, she will let me know when he releases her from pain management, she takes it 4 times daily now, but I used to give it to her for BID dose   Low calcium and magnesium: seen by Endo still taking supplementation, doing well at this time. She states unable to stop Omeprazole   Migraine/Tension headaches: she has episodes seldom, usually temporal, sharp, associated with photophobia, but no phonophobia. No nausea or vomiting. She takes prn Fioricet prn, still has a half bottle at home.Still doing well   Atherosclerosis Aorta: taking aspirin and is back on statin therapy. Unchanged   Patient Active Problem List   Diagnosis Date Noted  . Chronic bilateral back pain 07/26/2017  . Bilateral shoulder pain 07/26/2017  . Coronary artery calcification 05/17/2017  . Heart palpitations 05/15/2017  . History of acute gastritis   . Leukocytosis 09/30/2016  . Atherosclerosis of abdominal aorta (HCC) 08/18/2016  . Type 2  diabetes mellitus with stage 3 chronic kidney disease, without long-term current use of insulin (HCC) 12/16/2015  . Diabetic neuropathy associated with type 2 diabetes mellitus (HCC) 09/18/2015  . Insomnia 09/18/2015  . Benign essential HTN 06/18/2015  . Chronic kidney disease (CKD), stage III (moderate) (HCC) 06/18/2015  . Diabetes mellitus with renal manifestation (HCC) 06/18/2015  . Abnormal serum level of alkaline phosphatase 06/18/2015  . Bilateral low back pain with left-sided sciatica 06/18/2015  . Obesity (BMI 30.0-34.9)  06/18/2015  . Degenerative arthritis of hip 06/18/2015  . Sickle cell trait (HCC) 06/18/2015  . Dyslipidemia 05/27/2010  . Gastro-esophageal reflux disease without esophagitis 05/25/2008    Past Surgical History:  Procedure Laterality Date  . CHOLECYSTECTOMY    . COLONOSCOPY    . ESOPHAGOGASTRODUODENOSCOPY (EGD) WITH PROPOFOL N/A 10/05/2016   Procedure: ESOPHAGOGASTRODUODENOSCOPY (EGD) WITH PROPOFOL;  Surgeon: Midge Minium, MD;  Location: [facility];  Service: Endoscopy;  Laterality: N/A;  . FRACTURE SURGERY Left    cast and pins   . SHOULDER ARTHROSCOPY WITH ROTATOR CUFF REPAIR AND SUBACROMIAL DECOMPRESSION Left 12/07/2018   Procedure: left shoulder manipulation under anesthesia, left shoulder arthroscopic lysis of adhesions;  Surgeon: Lyndle Herrlich, MD;  Location: [facility];  Service: Orthopedics;  Laterality: Left;  . SHOULDER CLOSED REDUCTION Left 12/07/2018   Procedure: CLOSED MANIPULATION SHOULDER;  Surgeon: Lyndle Herrlich, MD;  Location: [facility];  Service: Orthopedics;  Laterality: Left;  . shoulder surgery  Left 06/10/2018   Dr. Derryl Harbor  . TUBAL LIGATION      Family History  Problem Relation Age of Onset  . Migraines Mother   . Diabetes Mother   . Cancer Mother        lung  . Arthritis Brother   . Breast cancer Maternal Aunt   . Cancer Maternal Uncle        Lung and Colon  . Cirrhosis Brother   . Breast cancer Cousin     Social History   Socioeconomic History  . Marital status: Single    Spouse name: Not on file  . Number of children: 2  . Years of education: Not on file  . Highest education level: Not on file  Occupational History  . Not on file  Social Needs  . Financial resource strain: Not very hard  . Food insecurity:    Worry: Never true    Inability: Never true  . Transportation needs:    Medical: No    Non-medical: No  Tobacco Use  . Smoking status: Never Smoker  . Smokeless tobacco: Never Used  Substance and Sexual Activity  .  Alcohol use: Yes    Alcohol/week: 0.0 standard drinks    Comment: rare  . Drug use: Yes    Types: Marijuana    Comment: smokes marijuana occasionally  . Sexual activity: Yes    Partners: Female    Birth control/protection: Other-see comments  Lifestyle  . Physical activity:    Days per week: 0 days    Minutes per session: 0 min  . Stress: Only a little  Relationships  . Social connections:    Talks on phone: More than three times a week    Gets together: More than three times a week    Attends religious service: Never    Active member of club or organization: No    Attends meetings of clubs or organizations: Never    Relationship status: Never married  . Intimate partner violence:    Fear of current  or ex partner: No    Emotionally abused: No    Physically abused: No    Forced sexual activity: No  Other Topics Concern  . 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  aspirin (ASPIRIN 81) 81 MG chewable tablet, Chew 1 tablet (81 mg total) by mouth daily., Disp: 30 tablet, Rfl: 0 .  atorvastatin (LIPITOR) 20 MG tablet, TAKE 1 TABLET BY MOUTH ONCE DAILY, Disp: 30 tablet, Rfl: 5 .  Butalbital-APAP-Caffeine 50-300-40 MG CAPS, Take 1 capsule by mouth every 4 (four) hours as needed., Disp: 40 capsule, Rfl: 0 .  Cholecalciferol (VITAMIN D) 2000 units CAPS, Take 1 capsule (2,000 Units total) by mouth daily., Disp: 30 capsule, Rfl: 0 .  cyclobenzaprine (FLEXERIL) 5 MG tablet, Take 1-2 tablets (5-10 mg total) by mouth 2 (two) times daily. One in am and 2 in pm, Disp: 90 tablet, Rfl: 2 .  gabapentin (NEURONTIN) 300 MG capsule, Take 1 capsule (300 mg total) by mouth 2 (two) times daily., Disp: 60 capsule, Rfl: 5 .  Magnesium Oxide 400 MG  CAPS, Take 1 capsule (400 mg total) by mouth 2 (two) times daily., Disp: 60 capsule, Rfl: 5 .  omeprazole (PRILOSEC) 20 MG capsule, Take 1 capsule (20 mg total) by mouth daily., Disp: 30 capsule, Rfl: 5 .  oxyCODONE-acetaminophen (PERCOCET) 10-325 MG tablet, Take 1 tablet by mouth every 6 (six) hours as needed for pain. (Patient taking differently: Take 0.5 tablets by mouth every 6 (six) hours as needed for pain. ), Disp: 20 tablet, Rfl: 0 .  promethazine (PHENERGAN) 12.5 MG tablet, Take 1 tablet (12.5 mg total) by mouth every 8 (eight) hours as needed for nausea or vomiting., Disp: 10 tablet, Rfl: 0 .  Semaglutide,0.25 or 0.5MG /DOS, (OZEMPIC, 0.25 OR 0.5 MG/DOSE,) 2 MG/1.5ML SOPN, Inject 0.5 mg into the muscle every Monday. (Patient taking differently: Inject 0.25 mg into the muscle every Monday. ), Disp: 9 mL, Rfl: 0 .  temazepam (RESTORIL) 15 MG capsule, Take 1 capsule (15 mg total) by mouth as needed., Disp: 30 capsule, Rfl: 2 .  valsartan (DIOVAN) 160 MG tablet, Take 1 tablet (160 mg total) by mouth daily., Disp: 30 tablet, Rfl: 2 .  RA COL-RITE 100 MG capsule, Take 100 mg by mouth 2 (two) times daily as needed for mild constipation. , Disp: , Rfl: 0  No Known Allergies  I personally reviewed active problem list, medication list, allergies, family history, social history with the patient/caregiver today.   ROS  Constitutional: Negative for fever, positive for  weight change.  Respiratory: Negative for cough and shortness of breath.   Cardiovascular: Negative for chest pain , positive for occasional palpitations.  Gastrointestinal: Negative for abdominal pain, no bowel changes.  Musculoskeletal: Negative for gait problem or joint swelling.  Skin: Negative for rash.  Neurological: Negative for dizziness, positive for intermittent  headache.  No other specific complaints in a complete review of systems (except as listed in HPI above).  Objective  Vitals:   01/09/19 1007  BP: 128/84   Pulse: 92  Resp: 16  Temp: 97.9 F (36.6 C)  TempSrc: Oral  SpO2: 96%  Weight: 164 lb 14.4 oz (74.8 kg)  Height:  5\' 3"  (1.6 m)    Body mass index is 29.21 kg/m.  Physical Exam  Constitutional: Patient appears well-developed and well-nourished. Overweight.  No distress.  HEENT: head atraumatic, normocephalic, pupils equal and reactive to light, neck supple, throat within normal limits Cardiovascular: Normal rate, regular rhythm and normal heart sounds.  No murmur heard. No BLE edema. Pulmonary/Chest: Effort normal and breath sounds normal. No respiratory distress. Abdominal: Soft.  There is no tenderness. Muscular Skeletal: pain with rom of let shoulder, still has paresthesia left lowe leg no  pain during palpation of lumbar spine  Psychiatric: Patient has a normal mood and affect. behavior is normal. Judgment and thought content normal.  Recent Results (from the past 2160 hour(s))  Glucose, capillary     Status: None   Collection Time: 12/07/18 11:17 AM  Result Value Ref Range   Glucose-Capillary 93 70 - 99 mg/dL  Urine Drug Screen, Qualitative ([facility] only)     Status: None   Collection Time: 12/07/18 11:40 AM  Result Value Ref Range   Tricyclic, Ur Screen NONE DETECTED NONE DETECTED   Amphetamines, Ur Screen NONE DETECTED NONE DETECTED   MDMA (Ecstasy)Ur Screen NONE DETECTED NONE DETECTED   Cocaine Metabolite,Ur [place] NONE DETECTED NONE DETECTED   Opiate, Ur Screen NONE DETECTED NONE DETECTED   Phencyclidine (PCP) Ur S NONE DETECTED NONE DETECTED   Cannabinoid 50 Ng, Ur [place] NONE DETECTED NONE DETECTED   Barbiturates, Ur Screen NONE DETECTED NONE DETECTED   Benzodiazepine, Ur Scrn NONE DETECTED NONE DETECTED   Methadone Scn, Ur NONE DETECTED NONE DETECTED    Comment: (NOTE) Tricyclics + metabolites, urine    Cutoff 1000 ng/mL Amphetamines + metabolites, urine  Cutoff 1000 ng/mL MDMA (Ecstasy), urine              Cutoff 500 ng/mL Cocaine Metabolite, urine          Cutoff 300  ng/mL Opiate + metabolites, urine        Cutoff 300 ng/mL Phencyclidine (PCP), urine         Cutoff 25 ng/mL Cannabinoid, urine                 Cutoff 50 ng/mL Barbiturates + metabolites, urine  Cutoff 200 ng/mL Benzodiazepine, urine              Cutoff 200 ng/mL Methadone, urine                   Cutoff 300 ng/mL The urine drug screen provides only a preliminary, unconfirmed analytical test result and should not be used for non-medical purposes. Clinical consideration and professional judgment should be applied to any positive drug screen result due to possible interfering substances. A more specific alternate chemical method must be used in order to obtain a confirmed analytical result. Gas chromatography / mass spectrometry (GC/MS) is the preferred confirmat ory method. Performed at [facility], [address] [place], [place] [address]   Glucose, capillary     Status: Abnormal   Collection Time: 12/07/18  2:36 PM  Result Value Ref Range   Glucose-Capillary 109 (H) 70 - 99 mg/dL  POCT UA - Microalbumin     Status: Normal   Collection Time: 01/09/19 10:09 AM  Result Value Ref Range   Microalbumin Ur, POC 20 mg/L   Creatinine, POC     Albumin/Creatinine Ratio, Urine, POC    POCT Glucose (CBG)     Status: Normal   Collection Time: 01/09/19 10:09 AM  Result Value Ref Range   POC Glucose 82 70 - 99 mg/dl    Comment: Fasting  POCT HgB A1C     Status: Normal   Collection Time: 01/09/19 10:14 AM  Result Value Ref Range   Hemoglobin A1C     HbA1c POC (<> result, manual entry)     HbA1c, POC (prediabetic range)     HbA1c, POC (controlled diabetic range) 5.9 0.0 - 7.0 %      PHQ2/9: Depression screen [facility] 2/9 01/09/2019 09/28/2018 09/19/2018 08/04/2018 05/03/2018  Decreased Interest 1 0 0 0 0  Down, Depressed, Hopeless 0 0 0 0 0  PHQ - 2 Score 1 0 0 0 0  Altered sleeping 2 0 0 - -  Tired, decreased energy 1 0 0 - -  Change in appetite 0 0 0 - -  Feeling bad or failure  about yourself  0 0 0 - -  Trouble concentrating 0 0 0 - -  Moving slowly or fidgety/restless 0 0 0 - -  Suicidal thoughts 0 0 0 - -  PHQ-9 Score 4 0 0 - -  Difficult doing work/chores Not difficult at all Not difficult at all Not difficult at all - -     Fall Risk: Fall Risk  01/09/2019 09/28/2018 09/19/2018 08/04/2018 05/03/2018  Falls in the past year? 1 Yes Yes Yes Yes  Comment - - - - Tripped at work  Number falls in past yr: 0 1 1 1 1   Injury with Fall? 1 Yes Yes Yes Yes  Comment At work Jan. 16 2019 - - Left shoulder -  Follow up - - - - -    Functional Status Survey: Is the patient deaf or have difficulty hearing?: No Does the patient have difficulty seeing, even when wearing glasses/contacts?: Yes(Contacts and glasses) Does the patient have difficulty concentrating, remembering, or making decisions?: No Does the patient have difficulty walking or climbing stairs?: No Does the patient have difficulty dressing or bathing?: Yes(Shoulder Pain) Does the patient have difficulty doing errands alone such as visiting a doctor's office or shopping?: No    Assessment & Plan  1. Type 2 diabetes mellitus with stage 3 chronic kidney disease, without long-term current use of insulin (HCC)  - POCT HgB A1C - POCT UA - Microalbumin - POCT Glucose (CBG)  2. Tension headache  - Butalbital-APAP-Caffeine 50-300-40 MG CAPS; Take 1 capsule by mouth every 4 (four) hours as needed.  Dispense: 40 capsule; Refill: 0  3. Chronic bilateral low back pain with bilateral sciatica  - cyclobenzaprine (FLEXERIL) 5 MG tablet; Take 1-2 tablets (5-10 mg total) by mouth 2 (two) times daily. One in am and 2 in pm  Dispense: 90 tablet; Refill: 2  4. Diabetic polyneuropathy associated with type 2 diabetes mellitus (HCC)  - gabapentin (NEURONTIN) 300 MG capsule; Take 1 capsule (300 mg total) by mouth 2 (two) times daily.  Dispense: 60 capsule; Refill: 5  5. Chronic bilateral low back pain with left-sided  sciatica  - gabapentin (NEURONTIN) 300 MG capsule; Take 1 capsule (300 mg total) by mouth 2 (two) times daily.  Dispense: 60 capsule; Refill: 5  6. Other insomnia  - temazepam (RESTORIL) 15 MG capsule; Take 1 capsule (15 mg total) by mouth as needed.  Dispense: 30 capsule; Refill: 2  7. Benign essential HTN  - valsartan (DIOVAN) 160 MG tablet; Take 1 tablet (160 mg total) by mouth daily.  Dispense: 30 tablet; Refill: 2  8. History of shoulder surgery  Had a revision done 11/2018 - workmans comp claim, seeing Ortho

## 2019-04-04 ENCOUNTER — Other Ambulatory Visit: Payer: Self-pay | Admitting: Family Medicine

## 2019-04-04 DIAGNOSIS — I10 Essential (primary) hypertension: Secondary | ICD-10-CM

## 2019-04-18 ENCOUNTER — Ambulatory Visit (INDEPENDENT_AMBULATORY_CARE_PROVIDER_SITE_OTHER): Payer: BLUE CROSS/BLUE SHIELD | Admitting: Family Medicine

## 2019-04-18 ENCOUNTER — Other Ambulatory Visit: Payer: Self-pay

## 2019-04-18 ENCOUNTER — Encounter: Payer: Self-pay | Admitting: Family Medicine

## 2019-04-18 DIAGNOSIS — I10 Essential (primary) hypertension: Secondary | ICD-10-CM

## 2019-04-18 DIAGNOSIS — M5442 Lumbago with sciatica, left side: Secondary | ICD-10-CM

## 2019-04-18 DIAGNOSIS — K219 Gastro-esophageal reflux disease without esophagitis: Secondary | ICD-10-CM

## 2019-04-18 DIAGNOSIS — G4709 Other insomnia: Secondary | ICD-10-CM

## 2019-04-18 DIAGNOSIS — R112 Nausea with vomiting, unspecified: Secondary | ICD-10-CM | POA: Diagnosis not present

## 2019-04-18 DIAGNOSIS — E785 Hyperlipidemia, unspecified: Secondary | ICD-10-CM

## 2019-04-18 DIAGNOSIS — E1142 Type 2 diabetes mellitus with diabetic polyneuropathy: Secondary | ICD-10-CM

## 2019-04-18 DIAGNOSIS — G8929 Other chronic pain: Secondary | ICD-10-CM

## 2019-04-18 DIAGNOSIS — E1122 Type 2 diabetes mellitus with diabetic chronic kidney disease: Secondary | ICD-10-CM

## 2019-04-18 DIAGNOSIS — D573 Sickle-cell trait: Secondary | ICD-10-CM

## 2019-04-18 DIAGNOSIS — N183 Chronic kidney disease, stage 3 unspecified: Secondary | ICD-10-CM

## 2019-04-18 DIAGNOSIS — G44209 Tension-type headache, unspecified, not intractable: Secondary | ICD-10-CM | POA: Diagnosis not present

## 2019-04-18 DIAGNOSIS — I700 Atherosclerosis of aorta: Secondary | ICD-10-CM

## 2019-04-18 MED ORDER — SEMAGLUTIDE(0.25 OR 0.5MG/DOS) 2 MG/1.5ML ~~LOC~~ SOPN: 0.2500 mg | PEN_INJECTOR | SUBCUTANEOUS | 2 refills | Status: DC

## 2019-04-18 MED ORDER — MAGNESIUM OXIDE 400 (241.3 MG) MG PO TABS: 1.0000 | ORAL_TABLET | Freq: Two times a day (BID) | ORAL | 5 refills | Status: DC

## 2019-04-18 MED ORDER — GABAPENTIN 300 MG PO CAPS: 300.0000 mg | ORAL_CAPSULE | Freq: Two times a day (BID) | ORAL | 5 refills | Status: DC

## 2019-04-18 MED ORDER — OMEPRAZOLE 20 MG PO CPDR: 20.0000 mg | DELAYED_RELEASE_CAPSULE | Freq: Every day | ORAL | 5 refills | Status: DC

## 2019-04-18 MED ORDER — ATORVASTATIN CALCIUM 20 MG PO TABS: 20.0000 mg | ORAL_TABLET | Freq: Every day | ORAL | 5 refills | Status: DC

## 2019-04-18 MED ORDER — GLUCOSE BLOOD VI STRP: ORAL_STRIP | 12 refills | Status: DC

## 2019-04-18 MED ORDER — VALSARTAN 160 MG PO TABS: ORAL_TABLET | ORAL | 5 refills | Status: DC

## 2019-04-18 MED ORDER — PROMETHAZINE HCL 12.5 MG PO TABS: 12.5000 mg | ORAL_TABLET | Freq: Three times a day (TID) | ORAL | 0 refills | Status: DC | PRN

## 2019-04-18 MED ORDER — BUTALBITAL-APAP-CAFFEINE 50-300-40 MG PO CAPS: 1.0000 | ORAL_CAPSULE | ORAL | 0 refills | Status: DC | PRN

## 2019-04-18 NOTE — Progress Notes (Signed)
Name: Brandy Johnston Stroud   MRN: [id number]    DOB: [birth year]   Date:04/18/2019       Progress Note  Subjective  Chief Complaint  Chief Complaint  Patient presents with  . Diabetes  . Hypertension  . Back Pain  . Insomnia  . Medication Refill    I connected with  Brandy Johnston Rosamond  on 04/18/19 at 11:00 AM EDT by a video enabled telemedicine application and verified that I am speaking with the correct person using two identifiers.  I discussed the limitations of evaluation and management by telemedicine and the availability of in person appointments. The patient expressed understanding and agreed to proceed. Staff also discussed with the patient that there may be a patient responsible charge related to this service. Patient Location: at home  [facility]  HPI  DMII with renal manifestation, and neuropathy. She continues to have numbness and tingling onfinger tips and toes are better since she has been magnesium and gabapentin. She has not been checking her glucose levels at home , she would like of strips for her machine. Denies polyphagia, polydipsia or polyuria. She is offMetformin, and is on Ozempic 0.25 mg daily because higher dose caused nausea and inability to taste food. Last hgbA1C was down to 5.9%, she does not want to stop medication, she states medication curbs her appetite. She has gained more weight, she is baking at home and girlfriend is also cooking more often at home.   Insomnia: She is taking Temazepam prnsince not working , taking medication prn again.  Discussed sleep hygiene.   HTN: She is off Exforge because of dizziness, we went down to 320 but was still having symptoms and is tolerating Valsartan  160 mg dose. She hasoccasionally has palpitation ( seen cardiologist and was given Lopressor but she stopped because it did not improve symptoms).   Hyperlipidemia:reviewed labs and last LDL is at goal,but LDL went up , she is now  taking it daily, goal LDL is below 70  GERD: she is back on Omeprazolebut states not compliant with diet.No epigastric pain,and is off nsaid's now, but unable to wean self off . Reminded her that it may be one of the causes of low magnesium level She still has episodes of nausea depending on her diet   Chronic low back pain with lumbar radiculitis:no recent flares,The left outer thigh and bottom of left foot stays numb. She had NCS and MRI. She does not want to see Dr. Council Mechanichasniss at this time or get PT because of cost, she is on gabapentin and since not working ( had shoulder surgery in June) she is doing better. Advised to only take flexeril prn . Unchanged   Left shoulder surgery : workman's comp, under the care of Dr. Odis LusterBowers and he is currently giving her Tramadol that we previously rx for back pain, last working day June 11 th, 2020 , she is going for Functional Capacity Evaluation . She is having PT through telemedicine but noticing that she is losing function again  Low calcium and magnesium: seen by Endo still taking supplementation, doing well at this time. She states unable to stop Omeprazole . Unchanged   Migraine/Tension headaches: she has episodes seldom, usually temporal, sharp, associated with photophobia, but no phonophobia. No nausea or vomiting. She takes prn Fioricet prn, still has a half bottle at home.She states she was eating more ham and bacon and headaches have returned more often, advised to resume a healthier  diet and lose weight.   Atherosclerosis Aorta: taking aspirin and statins    Patient Active Problem List   Diagnosis Date Noted  . Chronic bilateral back pain 07/26/2017  . Bilateral shoulder pain 07/26/2017  . Coronary artery calcification 05/17/2017  . Heart palpitations 05/15/2017  . History of acute gastritis   . Leukocytosis 09/30/2016  . Atherosclerosis of abdominal aorta (HCC) 08/18/2016  . Type 2 diabetes mellitus with stage 3 chronic kidney  disease, without long-term current use of insulin (HCC) 12/16/2015  . Diabetic neuropathy associated with type 2 diabetes mellitus (HCC) 09/18/2015  . Insomnia 09/18/2015  . Benign essential HTN 06/18/2015  . Chronic kidney disease (CKD), stage III (moderate) (HCC) 06/18/2015  . Diabetes mellitus with renal manifestation (HCC) 06/18/2015  . Abnormal serum level of alkaline phosphatase 06/18/2015  . Bilateral low back pain with left-sided sciatica 06/18/2015  . Obesity (BMI 30.0-34.9) 06/18/2015  . Degenerative arthritis of hip 06/18/2015  . Sickle cell trait (HCC) 06/18/2015  . Dyslipidemia 05/27/2010  . Gastro-esophageal reflux disease without esophagitis 05/25/2008    Past Surgical History:  Procedure Laterality Date  . CHOLECYSTECTOMY    . COLONOSCOPY    . ESOPHAGOGASTRODUODENOSCOPY (EGD) WITH PROPOFOL N/A 10/05/2016   Procedure: ESOPHAGOGASTRODUODENOSCOPY (EGD) WITH PROPOFOL;  Surgeon: Midge Minium, MD;  [facility];  Service: Endoscopy;  Laterality: N/A;  . FRACTURE SURGERY Left    cast and pins   . SHOULDER ARTHROSCOPY WITH ROTATOR CUFF REPAIR AND SUBACROMIAL DECOMPRESSION Left 12/07/2018   Procedure: left shoulder manipulation under anesthesia, left shoulder arthroscopic lysis of adhesions;  Surgeon: Lyndle Herrlich, MD;  Location: [facility];  Service: Orthopedics;  Laterality: Left;  . SHOULDER CLOSED REDUCTION Left 12/07/2018   Procedure: CLOSED MANIPULATION SHOULDER;  Surgeon: Lyndle Herrlich, MD;  Location: [facility];  Service: Orthopedics;  Laterality: Left;  . shoulder surgery  Left 06/10/2018   Dr. Derryl Harbor  . TUBAL LIGATION      Family History  Problem Relation Age of Onset  . Migraines Mother   . Diabetes Mother   . Cancer Mother        lung  . Arthritis Brother   . Breast cancer Maternal Aunt   . Cancer Maternal Uncle        Lung and Colon  . Cirrhosis Brother   . Breast cancer Cousin     Social History   Socioeconomic History  . Marital  status: Single    Spouse name: Not on file  . Number of children: 2  . Years of education: Not on file  . Highest education level: Not on file  Occupational History  . Not on file  Social Needs  . Financial resource strain: Not very hard  . Food insecurity:    Worry: Never true    Inability: Never true  . Transportation needs:    Medical: No    Non-medical: No  Tobacco Use  . Smoking status: Never Smoker  . Smokeless tobacco: Never Used  Substance and Sexual Activity  . Alcohol use: Yes    Alcohol/week: 0.0 standard drinks    Comment: rare  . Drug use: Yes    Types: Marijuana    Comment: smokes marijuana occasionally  . Sexual activity: Yes    Partners: Female    Birth control/protection: Other-see comments  Lifestyle  . Physical activity:    Days per week: 0 days    Minutes per session: 0 min  . Stress: Only a little  Relationships  .  Social connections:    Talks on phone: More than three times a week    Gets together: More than three times a week    Attends religious service: Never    Active member of club or organization: No    Attends meetings of clubs or organizations: Never    Relationship status: Never married  . Intimate partner violence:    Fear of current or ex partner: No    Emotionally abused: No    Physically abused: No    Forced sexual activity: No  Other Topics Concern  . 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  aspirin (ASPIRIN 81) 81 MG chewable tablet, Chew 1 tablet (81 mg total) by mouth daily., Disp: 30 tablet, Rfl: 0 .  atorvastatin (LIPITOR) 20 MG tablet, TAKE 1 TABLET BY MOUTH ONCE DAILY, Disp: 30 tablet, Rfl: 5 .  Butalbital-APAP-Caffeine 50-300-40 MG CAPS, Take 1 capsule by mouth  every 4 (four) hours as needed., Disp: 40 capsule, Rfl: 0 .  Cholecalciferol (VITAMIN Johnston) 2000 units CAPS, Take 1 capsule (2,000 Units total) by mouth daily., Disp: 30 capsule, Rfl: 0 .  cyclobenzaprine (FLEXERIL) 5 MG tablet, Take 1-2 tablets (5-10 mg total) by mouth 2 (two) times daily. One in am and 2 in pm, Disp: 90 tablet, Rfl: 2 .  gabapentin (NEURONTIN) 300 MG capsule, Take 1 capsule (300 mg total) by mouth 2 (two) times daily., Disp: 60 capsule, Rfl: 5 .  Magnesium Oxide 400 MG CAPS, Take 1 capsule (400 mg total) by mouth 2 (two) times daily., Disp: 60 capsule, Rfl: 5 .  MAGNESIUM-OXIDE 400 (241.3 Mg) MG tablet, TAKE 1 TABLET BY MOUTH TWICE DAILY, Disp: 60 tablet, Rfl: 0 .  omeprazole (PRILOSEC) 20 MG capsule, Take 1 capsule (20 mg total) by mouth daily., Disp: 30 capsule, Rfl: 5 .  oxyCODONE-acetaminophen (PERCOCET) 10-325 MG tablet, Take 1 tablet by mouth every 6 (six) hours as needed for pain. (Patient taking differently: Take 0.5 tablets by mouth every 6 (six) hours as needed for pain. ), Disp: 20 tablet, Rfl: 0 .  promethazine (PHENERGAN) 12.5 MG tablet, Take 1 tablet (12.5 mg total) by mouth every 8 (eight) hours as needed for nausea or vomiting., Disp: 10 tablet, Rfl: 0 .  RA COL-RITE 100 MG capsule, Take 100 mg by mouth 2 (two) times daily as needed for mild constipation. , Disp: , Rfl: 0 .  Semaglutide,0.25 or 0.5MG /DOS, (OZEMPIC, 0.25 OR 0.5 MG/DOSE,) 2 MG/1.5ML SOPN, Inject 0.5 mg into the muscle every Monday. (Patient taking differently: Inject 0.25 mg into the muscle every Monday. ), Disp: 9 mL, Rfl: 0 .  temazepam (RESTORIL) 15 MG capsule, Take 1 capsule (15 mg total) by mouth as needed., Disp: 30 capsule, Rfl: 2 .  valsartan (DIOVAN) 160 MG tablet, TAKE 1 TABLET(160 MG) BY MOUTH DAILY, Disp: 30 tablet, Rfl: 0  No Known Allergies  I personally reviewed active problem list, medication list, allergies, family history, social history with the patient/caregiver today.   ROS  Ten  systems reviewed and is negative except as mentioned in HPI   Objective  Virtual encounter, vitals not obtained.  There is no height or weight on file to calculate  BMI.  Physical Exam  Awake , alert and oriented and well groomed  PHQ2/9: Depression screen [facility] 2/9 04/18/2019 01/09/2019 09/28/2018 09/19/2018 08/04/2018  Decreased Interest 0 1 0 0 0  Down, Depressed, Hopeless 0 0 0 0 0  PHQ - 2 Score 0 1 0 0 0  Altered sleeping 0 2 0 0 -  Tired, decreased energy 0 1 0 0 -  Change in appetite 0 0 0 0 -  Feeling bad or failure about yourself  0 0 0 0 -  Trouble concentrating 0 0 0 0 -  Moving slowly or fidgety/restless 0 0 0 0 -  Suicidal thoughts 0 0 0 0 -  PHQ-9 Score 0 4 0 0 -  Difficult doing work/chores - Not difficult at all Not difficult at all Not difficult at all -   PHQ-2/9 Result is negative.    Fall Risk: Fall Risk  04/18/2019 01/09/2019 09/28/2018 09/19/2018 08/04/2018  Falls in the past year? 0 1 Yes Yes Yes  Comment - - - - -  Number falls in past yr: 0 0 Injury with Fall? 0 1 Yes Yes Yes  Comment - At work Jan. 16 2019 - - Left shoulder  Follow up - - - - -    Assessment & Plan  1. Tension headache  - Butalbital-APAP-Caffeine 50-300-40 MG CAPS; Take 1 capsule by mouth every 4 (four) hours as needed.  Dispense: 40 capsule; Refill: 0  2. Gastro-esophageal reflux disease without esophagitis  - omeprazole (PRILOSEC) 20 MG capsule; Take 1 capsule (20 mg total) by mouth daily.  Dispense: 30 capsule; Refill: 5  3. Non-intractable vomiting with nausea, unspecified vomiting type  - promethazine (PHENERGAN) 12.5 MG tablet; Take 1 tablet (12.5 mg total) by mouth every 8 (eight) hours as needed for nausea or vomiting.  Dispense: 10 tablet; Refill: 0  4. Type 2 diabetes mellitus with stage 3 chronic kidney disease, without long-term current use of insulin (HCC)  - Semaglutide,0.25 or 0.5MG /DOS, (OZEMPIC, 0.25 OR 0.5 MG/DOSE,) 2 MG/1.5ML SOPN; Inject 0.25 mg into the  muscle every Monday.  Dispense: 1.5 mL; Refill: 2  5. Diabetic polyneuropathy associated with type 2 diabetes mellitus (HCC)  - Semaglutide,0.25 or 0.5MG /DOS, (OZEMPIC, 0.25 OR 0.5 MG/DOSE,) 2 MG/1.5ML SOPN; Inject 0.25 mg into the muscle every Monday.  Dispense: 1.5 mL; Refill: 2  6. Other insomnia  Still has medication at home  7. Benign essential HTN  - valsartan (DIOVAN) 160 MG tablet; TAKE 1 TABLET(160 MG) BY MOUTH DAILY  Dispense: 30 tablet; Refill: 5   I discussed the assessment and treatment plan with the patient. The patient was provided an opportunity to ask questions and all were answered. The patient agreed with the plan and demonstrated an understanding of the instructions.  The patient was advised to call back or seek an in-person evaluation if the symptoms worsen or if the condition fails to improve as anticipated.  I provided 25 minutes of non-face-to-face time during this encounter.

## 2019-04-19 ENCOUNTER — Telehealth: Payer: Self-pay | Admitting: Family Medicine

## 2019-04-19 NOTE — Telephone Encounter (Signed)
Copied from CRM [id number]. Topic: General - Other >> Apr 19, 2019  5:11 PM Tamela Oddi wrote: Reason for CRM: Patient called to inform the doctor that the pharmacy told her that her insurance does not cover her One touch test strips for her current monitor.  Patient does not know what she should do.  Would like to know is there an alternative test strip that she can get a script for.  Please advise and call patient back at [phone]

## 2019-04-20 ENCOUNTER — Encounter: Payer: Self-pay | Admitting: Family Medicine

## 2019-04-20 NOTE — Telephone Encounter (Signed)
Patient was informed and will contact her insurance company as directed then call us back so we can submit the appropriate diabetic supplies.

## 2019-04-21 ENCOUNTER — Other Ambulatory Visit: Payer: Self-pay

## 2019-04-21 DIAGNOSIS — E1122 Type 2 diabetes mellitus with diabetic chronic kidney disease: Secondary | ICD-10-CM

## 2019-04-21 DIAGNOSIS — N183 Chronic kidney disease, stage 3 unspecified: Secondary | ICD-10-CM

## 2019-04-21 MED ORDER — GLUCOSE BLOOD VI STRP: ORAL_STRIP | 12 refills | Status: AC

## 2019-04-21 NOTE — Telephone Encounter (Signed)
One touch ultra is not covered by insurance please sign new rx.

## 2019-05-05 ENCOUNTER — Other Ambulatory Visit: Payer: Self-pay | Admitting: Family Medicine

## 2019-05-05 DIAGNOSIS — R112 Nausea with vomiting, unspecified: Secondary | ICD-10-CM

## 2019-05-05 NOTE — Telephone Encounter (Signed)
Refill request for general medication. Promethazine   Last office visit 04/18/2019  Follow up on 08/11/2019

## 2019-05-08 ENCOUNTER — Telehealth: Payer: Self-pay | Admitting: Family Medicine

## 2019-05-08 ENCOUNTER — Other Ambulatory Visit: Payer: Self-pay | Admitting: Family Medicine

## 2019-05-08 DIAGNOSIS — I10 Essential (primary) hypertension: Secondary | ICD-10-CM

## 2019-05-08 MED ORDER — LISINOPRIL 20 MG PO TABS: 20.0000 mg | ORAL_TABLET | Freq: Every day | ORAL | 0 refills | Status: DC

## 2019-05-08 NOTE — Telephone Encounter (Signed)
Patient had a change in her insurance. Needs Lisinopril sent to pharmacy.

## 2019-05-08 NOTE — Telephone Encounter (Signed)
Copied from CRM [id number]. Topic: Quick Communication - See Telephone Encounter >> May 08, 2019 10:31 AM Angela Nevin wrote: CRM for notification. See Telephone encounter for: 05/08/19.  Patient states that due to insurance change, her medication valsartan (DIOVAN) 160 MG tablet has went up in price. She states that she is no longer able to afford this medication and she inquired if a cheaper medication may be sent in it's place. Please advise.

## 2019-05-19 ENCOUNTER — Other Ambulatory Visit: Payer: Self-pay | Admitting: Family Medicine

## 2019-05-19 DIAGNOSIS — M5442 Lumbago with sciatica, left side: Secondary | ICD-10-CM

## 2019-05-19 DIAGNOSIS — R112 Nausea with vomiting, unspecified: Secondary | ICD-10-CM

## 2019-05-19 DIAGNOSIS — G8929 Other chronic pain: Secondary | ICD-10-CM

## 2019-05-19 NOTE — Telephone Encounter (Signed)
Refill request for general medication. Flexeril and Phenergen  Last office visit 04/18/2019   Follow up 08/11/2019

## 2019-05-23 ENCOUNTER — Encounter: Payer: Self-pay | Admitting: Family Medicine

## 2019-05-30 IMAGING — CR DG SHOULDER 2+V*L*
1 series · 3 of 3 positions shown · non-contrast
Comparison: No recent prior.

CLINICAL DATA: Fall.

EXAM:
LEFT SHOULDER - 2+ VIEW

[Series 1: dg shoulder left · 0.14mm/px · 3 of 3 slices shown]
[im 1/3]
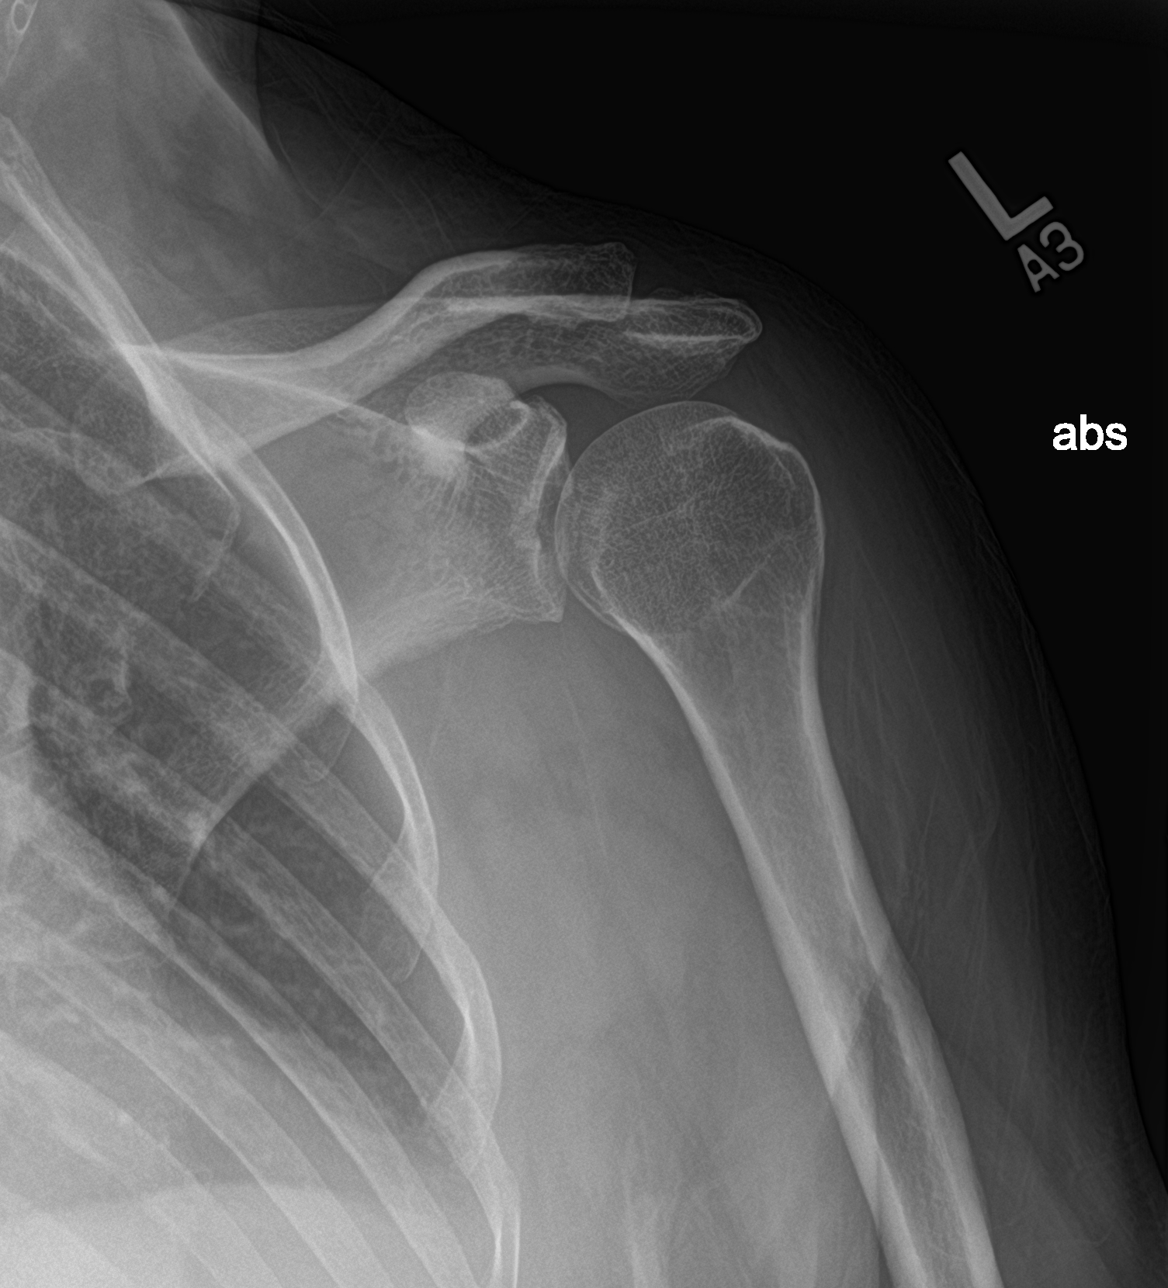
[im 2/3]
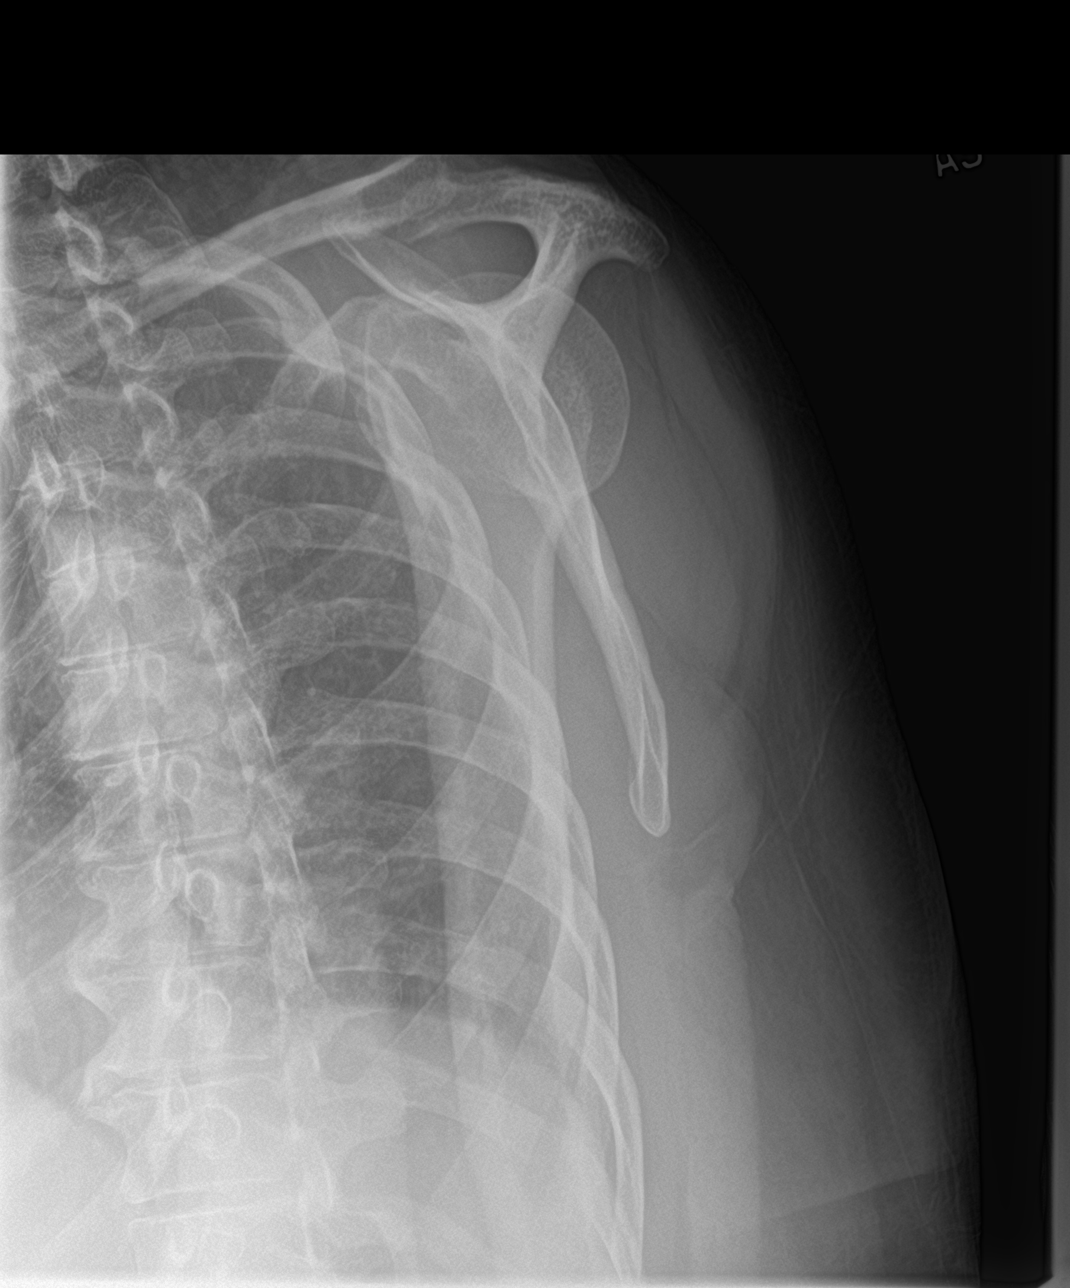
[im 3/3]
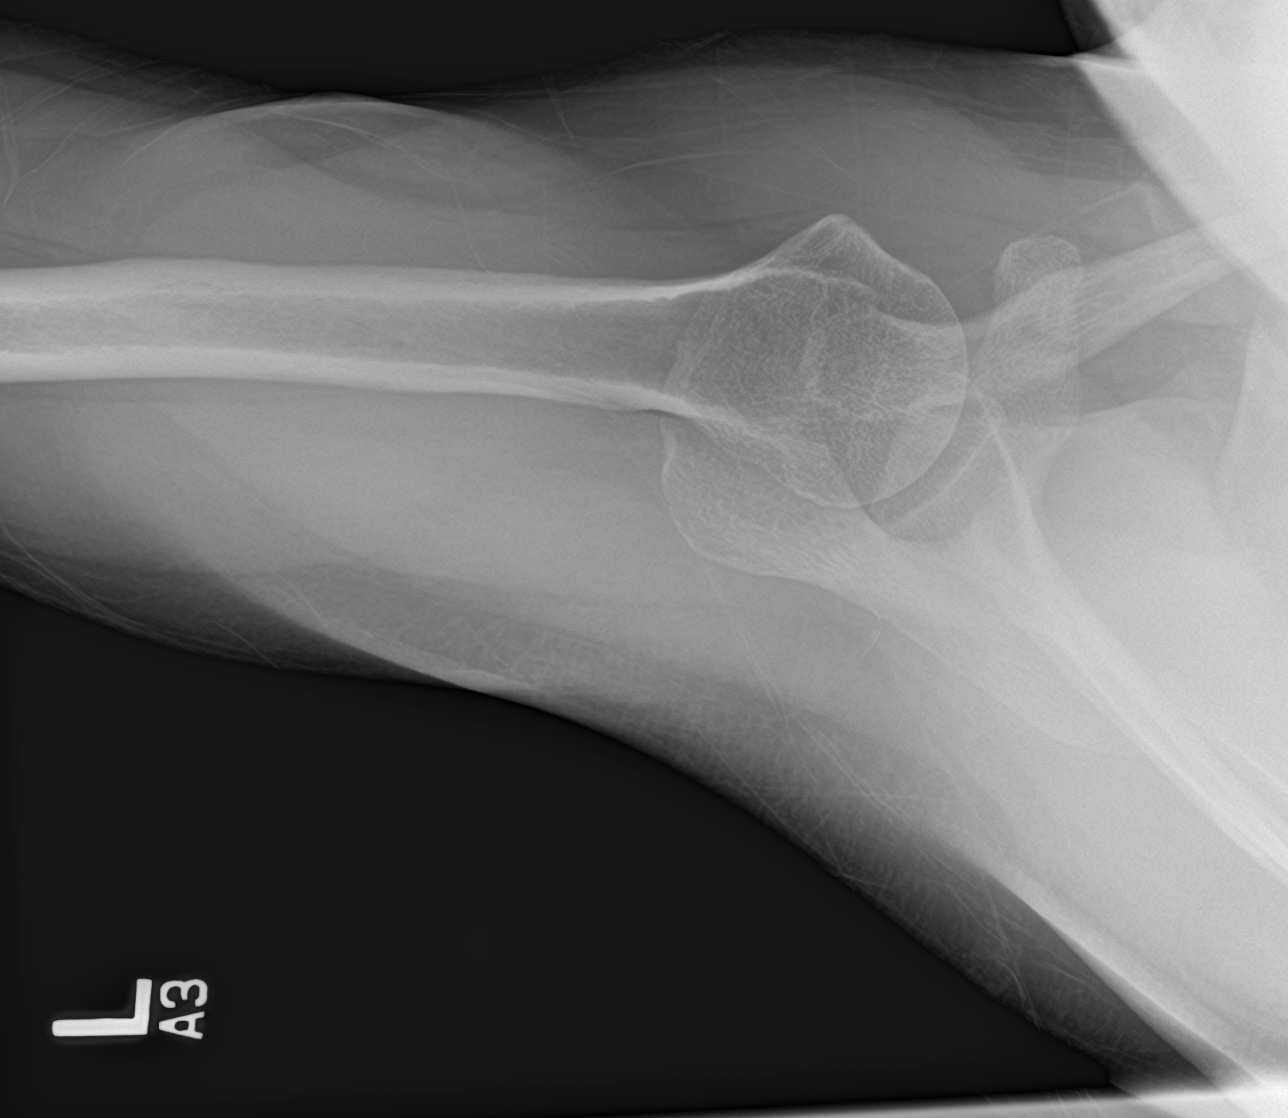

[3 of 3 positions shown; findings below may reference images not displayed]

FINDINGS: No acute bony or joint abnormality identified. Acromioclavicular and
glenohumeral degenerative change. No evidence of fracture or
dislocation.
IMPRESSION: Diffuse osteopenia and degenerative change.  No acute abnormality.

## 2019-08-11 ENCOUNTER — Encounter: Payer: Self-pay | Admitting: Family Medicine

## 2019-08-11 ENCOUNTER — Ambulatory Visit (INDEPENDENT_AMBULATORY_CARE_PROVIDER_SITE_OTHER): Payer: Self-pay | Admitting: Family Medicine

## 2019-08-11 VITALS — BP 100/58 | HR 83 | Wt 161.5 lb

## 2019-08-11 DIAGNOSIS — N183 Chronic kidney disease, stage 3 unspecified: Secondary | ICD-10-CM

## 2019-08-11 DIAGNOSIS — G8929 Other chronic pain: Secondary | ICD-10-CM

## 2019-08-11 DIAGNOSIS — M5442 Lumbago with sciatica, left side: Secondary | ICD-10-CM

## 2019-08-11 DIAGNOSIS — E1122 Type 2 diabetes mellitus with diabetic chronic kidney disease: Secondary | ICD-10-CM

## 2019-08-11 DIAGNOSIS — D573 Sickle-cell trait: Secondary | ICD-10-CM

## 2019-08-11 DIAGNOSIS — I10 Essential (primary) hypertension: Secondary | ICD-10-CM

## 2019-08-11 DIAGNOSIS — K219 Gastro-esophageal reflux disease without esophagitis: Secondary | ICD-10-CM

## 2019-08-11 DIAGNOSIS — G4709 Other insomnia: Secondary | ICD-10-CM

## 2019-08-11 DIAGNOSIS — I700 Atherosclerosis of aorta: Secondary | ICD-10-CM

## 2019-08-11 DIAGNOSIS — E1142 Type 2 diabetes mellitus with diabetic polyneuropathy: Secondary | ICD-10-CM

## 2019-08-11 DIAGNOSIS — E785 Hyperlipidemia, unspecified: Secondary | ICD-10-CM

## 2019-08-11 DIAGNOSIS — R790 Abnormal level of blood mineral: Secondary | ICD-10-CM

## 2019-08-11 LAB — POCT GLYCOSYLATED HEMOGLOBIN (HGB A1C): Hemoglobin A1C: 6.4 % — AB (ref 4.0–5.6)

## 2019-08-11 MED ORDER — OMEPRAZOLE 20 MG PO CPDR: 20.0000 mg | DELAYED_RELEASE_CAPSULE | Freq: Every day | ORAL | 1 refills | Status: DC

## 2019-08-11 MED ORDER — ATORVASTATIN CALCIUM 20 MG PO TABS: 20.0000 mg | ORAL_TABLET | Freq: Every day | ORAL | 1 refills | Status: DC

## 2019-08-11 MED ORDER — LISINOPRIL 10 MG PO TABS: 10.0000 mg | ORAL_TABLET | Freq: Every day | ORAL | 0 refills | Status: DC

## 2019-08-11 MED ORDER — OZEMPIC (0.25 OR 0.5 MG/DOSE) 2 MG/1.5ML ~~LOC~~ SOPN: 0.5000 mg | PEN_INJECTOR | SUBCUTANEOUS | 1 refills | Status: DC

## 2019-08-11 MED ORDER — GABAPENTIN 300 MG PO CAPS: 300.0000 mg | ORAL_CAPSULE | Freq: Two times a day (BID) | ORAL | 1 refills | Status: DC

## 2019-08-11 NOTE — Progress Notes (Signed)
Name: Brandy Johnston   MRN: [id number]    DOB: February 23, 1961   Date:08/11/2019       Progress Note  Subjective  Chief Complaint  Chief Complaint  Patient presents with  . Diabetes  . Hypertension  . Hyperlipidemia  . Gastroesophageal Reflux  . Migraine    I connected with  Candie Echevaria  on 08/11/19 at 10:40 AM EDT by a video enabled telemedicine application and verified that I am speaking with the correct person using two identifiers.  I discussed the limitations of evaluation and management by telemedicine and the availability of in person appointments. The patient expressed understanding and agreed to proceed. Staff also discussed with the patient that there may be a patient responsible charge related to this service. Patient Location: in route to dentist appointment , passenger side Provider Location: [facility] Additional Individuals present: girlfriend   HPI  DMII with renal manifestation, and neuropathy. She continues to have numbness and tingling onfinger tips and toes are better since she has been magnesium and gabapentin. She checks glucose occasionally and it was been in the 90's-100's. Denies polyphagia,  or polyuria, but has been feeling thirsty lately. She is offMetformin, and is on Ozempic 0.25 mg daily because higher dose of Metformin caused nausea and inability to taste food, she does not have insurance and will bring forms to be filled out to get ozempic from the company . Last hgbA1C was down to 5.9%, she does not want to stop medication, she states medication curbs her appetite. She is cutting down on sodas, eating mostly at home and has been trying to lose weight again   Insomnia: She is taking Temazepam prnsince not working , taking medication prn again.  Discussed sleep hygiene. She needs refills and will check [facility] and tell me where it is cheaper for her   HTN: She is off Exforge because of dizziness, we went down to 320 but was still having  symptoms and is tolerating Valsartan  160 mg dose, we changed to lisinopril because of cost and bp at home still low, we will check in our office today and if still low we will decrease dose today   Hyperlipidemia:reviewed labs and last LDL is at goal,but LDL went up ,she is now taking it daily, goal LDL is below 70  GERD: she is back on Omeprazolebut states not compliant with diet.No epigastric pain,and is off nsaid's now,but unable to wean self off. Reminded her that it may be one of the causes of low magnesium level She still has episodes of nausea depending on her diet Unchanged  Left shoulder surgery : workman's comp, under the care of Dr. Harlow Mares and he is currently giving her Tramadol that we previously rx for back pain, last working day June 11 th, 2020 , she went to functional capacity evaluation and is now applying for disability, not working at this time  Low calcium and magnesium: seen by Endo still taking supplementation,doing well at this time. She states unable to stop Omeprazole. She states cramping has resolved, cannot afford labs today   Migraine/Tension headaches: she has episodes seldom, usually temporal, sharp, associated with photophobia, but no phonophobia. No nausea or vomiting. She takes prn Fioricet prn, she will call when out of medicaiton   Atherosclerosis Aorta: taking aspirin and statins , discussed importance of rechecking labs   Patient Active Problem List   Diagnosis Date Noted  . Chronic bilateral back pain 07/26/2017  . Bilateral shoulder  pain 07/26/2017  . Coronary artery calcification 05/17/2017  . Heart palpitations 05/15/2017  . History of acute gastritis   . Leukocytosis 09/30/2016  . Atherosclerosis of abdominal aorta ([place]) 08/18/2016  . Type 2 diabetes mellitus with stage 3 chronic kidney disease, without long-term current use of insulin ([place]) 12/16/2015  . Diabetic neuropathy associated with type 2 diabetes mellitus ([place]) 09/18/2015   . Insomnia 09/18/2015  . Benign essential HTN 06/18/2015  . Chronic kidney disease (CKD), stage III (moderate) ([place]) 06/18/2015  . Diabetes mellitus with renal manifestation ([place]) 06/18/2015  . Abnormal serum level of alkaline phosphatase 06/18/2015  . Bilateral low back pain with left-sided sciatica 06/18/2015  . Obesity (BMI 30.0-34.9) 06/18/2015  . Degenerative arthritis of hip 06/18/2015  . Sickle cell trait ([place]) 06/18/2015  . Dyslipidemia 05/27/2010  . Gastro-esophageal reflux disease without esophagitis 05/25/2008    Past Surgical History:  Procedure Laterality Date  . CHOLECYSTECTOMY    . COLONOSCOPY    . ESOPHAGOGASTRODUODENOSCOPY (EGD) WITH PROPOFOL N/A 10/05/2016   Procedure: ESOPHAGOGASTRODUODENOSCOPY (EGD) WITH PROPOFOL;  Surgeon: Lucilla Lame, MD;  Location: [facility];  Service: Endoscopy;  Laterality: N/A;  . FRACTURE SURGERY Left    cast and pins   . SHOULDER ARTHROSCOPY WITH ROTATOR CUFF REPAIR AND SUBACROMIAL DECOMPRESSION Left 12/07/2018   Procedure: left shoulder manipulation under anesthesia, left shoulder arthroscopic lysis of adhesions;  Surgeon: Lovell Sheehan, MD;  Location: [facility];  Service: Orthopedics;  Laterality: Left;  . SHOULDER CLOSED REDUCTION Left 12/07/2018   Procedure: CLOSED MANIPULATION SHOULDER;  Surgeon: Lovell Sheehan, MD;  Location: [facility];  Service: Orthopedics;  Laterality: Left;  . shoulder surgery  Left 06/10/2018   Dr. Marica Otter  . TUBAL LIGATION      Family History  Problem Relation Age of Onset  . Migraines Mother   . Diabetes Mother   . Cancer Mother        lung  . Arthritis Brother   . Breast cancer Maternal Aunt   . Cancer Maternal Uncle        Lung and Colon  . Cirrhosis Brother   . Breast cancer Cousin     Social History   Socioeconomic History  . Marital status: Single    Spouse name: Not on file  . Number of children: 2  . Years of education: Not on file  . Highest education level: Not on file   Occupational History  . Not on file  Social Needs  . Financial resource strain: Not very hard  . Food insecurity    Worry: Never true    Inability: Never true  . Transportation needs    Medical: No    Non-medical: No  Tobacco Use  . Smoking status: Never Smoker  . Smokeless tobacco: Never Used  Substance and Sexual Activity  . Alcohol use: Yes    Alcohol/week: 0.0 standard drinks    Comment: rare  . Drug use: Yes    Types: Marijuana    Comment: smokes marijuana occasionally  . Sexual activity: Yes    Partners: Female    Birth control/protection: Other-see comments  Lifestyle  . Physical activity    Days per week: 0 days    Minutes per session: 0 min  . Stress: Only a little  Relationships  . Social connections    Talks on phone: More than three times a week    Gets together: More than three times a week    Attends religious service: Never  Active member of club or organization: No    Attends meetings of clubs or organizations: Never    Relationship status: Never married  . Intimate partner violence    Fear of current or ex partner: No    Emotionally abused: No    Physically abused: No    Forced sexual activity: No  Other Topics Concern  . 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  aspirin (ASPIRIN 81) 81 MG chewable tablet, Chew 1 tablet (81 mg total) by mouth daily., Disp: 30 tablet, Rfl: 0 .  atorvastatin (LIPITOR) 20 MG tablet, Take 1 tablet (20 mg total) by mouth daily., Disp: 90 tablet, Rfl: 1 .  Blood Glucose Monitoring Suppl (CONTOUR NEXT ONE) KIT, USE TO TEST BLOOD SUGAR ONCE D, Disp: , Rfl:  .  Butalbital-APAP-Caffeine 50-300-40 MG CAPS, Take 1 capsule by mouth every 4 (four) hours as needed., Disp:  40 capsule, Rfl: 0 .  Cholecalciferol (VITAMIN D) 2000 units CAPS, Take 1 capsule (2,000 Units total) by mouth daily., Disp: 30 capsule, Rfl: 0 .  cyclobenzaprine (FLEXERIL) 5 MG tablet, TAKE 1 TO 2 TABLETS BY MOUTH TWICE DAILY... TAKE 1 IN THE MORNING. AND TAKE 2 TABLETS IN THE AFTERNOON, Disp: 90 tablet, Rfl: 2 .  gabapentin (NEURONTIN) 300 MG capsule, Take 1 capsule (300 mg total) by mouth 2 (two) times daily., Disp: 180 capsule, Rfl: 1 .  glucose blood test strip, Use as instructed, Disp: 100 each, Rfl: 12 .  lisinopril (ZESTRIL) 10 MG tablet, Take 1 tablet (10 mg total) by mouth daily., Disp: 90 tablet, Rfl: 0 .  magnesium oxide (MAGNESIUM-OXIDE) 400 (241.3 Mg) MG tablet, Take 1 tablet (400 mg total) by mouth 2 (two) times daily., Disp: 60 tablet, Rfl: 5 .  Microlet Lancets MISC, USE TO CHECK BLOOD SUGAR ONCE D, Disp: , Rfl:  .  omeprazole (PRILOSEC) 20 MG capsule, Take 1 capsule (20 mg total) by mouth daily., Disp: 90 capsule, Rfl: 1 .  promethazine (PHENERGAN) 12.5 MG tablet, TAKE 1 TABLET(12.5 MG) BY MOUTH EVERY 8 HOURS AS NEEDED FOR NAUSEA OR VOMITING, Disp: 10 tablet, Rfl: 0 .  RA COL-RITE 100 MG capsule, Take 100 mg by mouth 2 (two) times daily as needed for mild constipation. , Disp: , Rfl: 0 .  Scar Treatment Products ([place] SPF 30) CREA, APPLY 1 GRAM BID PRN., Disp: , Rfl:  .  Semaglutide,0.25 or 0.5MG/DOS, (OZEMPIC, 0.25 OR 0.5 MG/DOSE,) 2 MG/1.5ML SOPN, Inject 0.25 mg into the muscle every Monday., Disp: 1.5 mL, Rfl: 2 .  temazepam (RESTORIL) 15 MG capsule, Take 1 capsule (15 mg total) by mouth as needed., Disp: 30 capsule, Rfl: 2 .  HYDROcodone-acetaminophen (NORCO/VICODIN) 5-325 MG tablet, TK 1 T PO Q 6 TO 8 H PRN, Disp: , Rfl:  .  meloxicam (MOBIC) 15 MG tablet, TK 1 T PO QD, Disp: , Rfl:   No Known Allergies  I personally reviewed active problem list, medication list, allergies, family history, social history with the patient/caregiver today.   ROS  Ten systems reviewed  and is negative except as mentioned in HPI   Objective  Virtual encounter, vitals not obtained.  Body mass index is 28.61 kg/m.  Physical Exam  Awake, alert and oriented  PHQ2/9: Depression screen [facility] 2/9 08/11/2019 04/18/2019 01/09/2019 09/28/2018 09/19/2018  Decreased Interest 0 0 1 0 0  Down, Depressed, Hopeless 0 0 0 0 0  PHQ - 2 Score 0 0 1 0 0  Altered sleeping 0 0 2 0 0  Tired, decreased energy 0 0 1 0 0  Change in appetite 0 0 0 0 0  Feeling bad or failure about yourself  0 0 0 0 0  Trouble concentrating 0 0 0 0 0  Moving slowly or fidgety/restless 0 0 0 0 0  Suicidal thoughts 0 0 0 0 0  PHQ-9 Score 0 0 4 0 0  Difficult doing work/chores - - Not difficult at all Not difficult at all Not difficult at all   PHQ-2/9 Result is negative.    Fall Risk: Fall Risk  08/11/2019 04/18/2019 01/09/2019 09/28/2018 09/19/2018  Falls in the past year? 0 0 1 Yes Yes  Comment - - - - -  Number falls in past yr: 0 0 0 1 1  Injury with Fall? 0 0 1 Yes Yes  Comment - - At work Jan. 16 2019 - -  Follow up - - - - -     Assessment & Plan  1. Diabetic polyneuropathy associated with type 2 diabetes mellitus (HCC)  - POCT glycosylated hemoglobin (Hb A1C) - gabapentin (NEURONTIN) 300 MG capsule; Take 1 capsule (300 mg total) by mouth 2 (two) times daily.  Dispense: 180 capsule; Refill: 1  2. Gastro-esophageal reflux disease without esophagitis  - omeprazole (PRILOSEC) 20 MG capsule; Take 1 capsule (20 mg total) by mouth daily.  Dispense: 90 capsule; Refill: 1  3. Atherosclerosis of abdominal aorta (HCC)  - Lipid panel - atorvastatin (LIPITOR) 20 MG tablet; Take 1 tablet (20 mg total) by mouth daily.  Dispense: 90 tablet; Refill: 1  4. Benign essential HTN  - COMPLETE METABOLIC PANEL WITH GFR - CBC with Differential/Platelet - lisinopril (ZESTRIL) 10 MG tablet; Take 1 tablet (10 mg total) by mouth daily.  Dispense: 90 tablet; Refill: 0  5. Sickle cell trait ([place])   6. Low  magnesium level  Doing well at this time  7. Dyslipidemia  - atorvastatin (LIPITOR) 20 MG tablet; Take 1 tablet (20 mg total) by mouth daily.  Dispense: 90 tablet; Refill: 1  8. Atherosclerosis of abdominal aorta (HCC)  - Lipid panel - atorvastatin (LIPITOR) 20 MG tablet; Take 1 tablet (20 mg total) by mouth daily.  Dispense: 90 tablet; Refill: 1  9. Chronic bilateral low back pain with left-sided sciatica  - gabapentin (NEURONTIN) 300 MG capsule; Take 1 capsule (300 mg total) by mouth 2 (two) times daily.  Dispense: 180 capsule; Refill: 1  10. Other insomnia     I discussed the assessment and treatment plan with the patient. The patient was provided an opportunity to ask questions and all were answered. The patient agreed with the plan and demonstrated an understanding of the instructions.  The patient was advised to call back or seek an in-person evaluation if the symptoms worsen or if the condition fails to improve as anticipated.  I provided 25 minutes of non-face-to-face time during this encounter.

## 2019-08-14 ENCOUNTER — Other Ambulatory Visit: Payer: Self-pay

## 2019-08-14 DIAGNOSIS — R112 Nausea with vomiting, unspecified: Secondary | ICD-10-CM

## 2019-08-14 MED ORDER — PROMETHAZINE HCL 12.5 MG PO TABS: 12.5000 mg | ORAL_TABLET | Freq: Four times a day (QID) | ORAL | 0 refills | Status: DC | PRN

## 2019-08-14 NOTE — Telephone Encounter (Signed)
Patient states during her visit Dr. Ancil Boozer would send the Promethazine to the cheapest pharmacy. [facility] they would only charge $4.61 from Kristopher Oppenheim for 12 of them. Also patient want to inquire about Ozempic and Almyra Free helping her since she has no insurance.

## 2019-08-15 ENCOUNTER — Ambulatory Visit: Payer: Self-pay | Admitting: Pharmacist

## 2019-08-15 DIAGNOSIS — E1122 Type 2 diabetes mellitus with diabetic chronic kidney disease: Secondary | ICD-10-CM

## 2019-08-15 DIAGNOSIS — E1142 Type 2 diabetes mellitus with diabetic polyneuropathy: Secondary | ICD-10-CM

## 2019-08-15 DIAGNOSIS — N183 Chronic kidney disease, stage 3 unspecified: Secondary | ICD-10-CM

## 2019-08-15 NOTE — Patient Instructions (Signed)
Brandy Johnston was given information about Care Management services today including:  1. Care Management services includes personalized support from designated clinical staff supervised by her physician, including individualized plan of care and coordination with other care providers 2. 24/7 contact phone numbers for assistance for urgent and routine care needs. 3. The patient may stop case management services at any time by phone call to the office staff.  Patient agreed to services and verbal consent obtained.

## 2019-08-15 NOTE — Chronic Care Management (AMB) (Signed)
  Care Management   Note  08/15/2019 Name: DAVIONNE MASTRANGELO MRN: [id number] DOB: [birth year]  CURRY DULSKI is a 58 y.o. year old female who sees Steele Sizer, MD for primary care. Dr. Ancil Boozer asked the CM pharmacist to consult the patient for medication assistance (Ozempic). Referral was placed 08/11/19. Telephone outreach to patient today to introduce care management services.   Plan: Ms. Casso agreed that care management services would be helpful to him/her and verbal consent was obtained. I have scheduled an initial office visit for 08/16/19   Ms. Brunner was given information about Care Management services today including:  1. Case Management services includes personalized support from designated clinical staff supervised by her physician, including individualized plan of care and coordination with other care providers 2. 24/7 contact phone numbers for assistance for urgent and routine care needs. 3. The patient may stop case management services at any time by phone call to the office staff.  Patient agreed to services and verbal consent obtained.    Ruben Reason, PharmD Clinical Pharmacist [facility] [phone]

## 2019-08-16 ENCOUNTER — Ambulatory Visit: Payer: Self-pay | Admitting: Pharmacist

## 2019-08-16 DIAGNOSIS — N183 Chronic kidney disease, stage 3 unspecified: Secondary | ICD-10-CM

## 2019-08-16 DIAGNOSIS — E1122 Type 2 diabetes mellitus with diabetic chronic kidney disease: Secondary | ICD-10-CM

## 2019-08-16 NOTE — Chronic Care Management (AMB) (Signed)
  Care Management   Note  08/16/2019 Name: Brandy Johnston MRN: [id number] DOB: Aug 09, 1961  Subjective: Brandy Johnston is a 58 year old patient of Dr. Ancil Boozer. Recently lost her Worker's comp, currently uninsured/self-pay. Engaged with clinical pharmacist for medication assistance for Ozempic. Successful telephone outreach, HIPAA identifiers verified.  Assessment: Patient previously completed [employer] application, left with clinical pharmacist. PharmD contacted [employer] to follow up on the types of financial documentation needed because patient currently does not have any of those documents as she is unemployed due to workers comp (not [place]), does not have a W2, is in the process of applying for disability, etc. Representative states to submit financial income for household- ie, income of girlfriend or partner if Brandy Johnston is living with someone. Brandy Johnston girlfriend is sometimes living with her, but not reliably and she does not want to submit her girlfriend's W2.   Goals Addressed            This Visit's Progress   . I don't have insurance coverage right now and Ozempic is expensive (pt-stated)       Current Barriers:  . Financial . uninsured  Pharmacist Clinical Goal(s): Over the next 14 days, Brandy Johnston will provide the necessary supplementary documents (proof of out of pocket prescription expenditure, proof of household income) needed for medication assistance applications to CCM pharmacist.   Interventions: . CCM pharmacist will apply for medication assistance program for Ozempic made by [employer] and prescribed by Dr. Ancil Boozer   Patient Self Care Activities:  Marland Kitchen Gather necessary documents needed to apply for medication assistance  Initial goal documentation         Follow up plan: Telephone follow up appointment with care management team member scheduled for: 2 weeks; care coordination for PharmD in 1 week  Ruben Reason, PharmD Clinical Pharmacist [place] [phone]

## 2019-09-07 ENCOUNTER — Encounter: Payer: Self-pay | Admitting: Family Medicine

## 2019-10-11 ENCOUNTER — Other Ambulatory Visit: Payer: Self-pay | Admitting: Family Medicine

## 2019-10-11 DIAGNOSIS — R112 Nausea with vomiting, unspecified: Secondary | ICD-10-CM

## 2019-10-11 MED ORDER — PROMETHAZINE HCL 12.5 MG PO TABS: 12.5000 mg | ORAL_TABLET | Freq: Four times a day (QID) | ORAL | 0 refills | Status: DC | PRN

## 2019-10-11 NOTE — Telephone Encounter (Signed)
Pt called in to ask if provider would send in a Rx for nausea medication? Pt says the last time she sent Rx to  [place], [place] [phone] (Phone) [phone] (Fax)

## 2019-10-19 ENCOUNTER — Telehealth: Payer: Self-pay | Admitting: Family Medicine

## 2019-10-19 NOTE — Telephone Encounter (Signed)
Patient is requesting a copy of medications to pick up to

## 2019-10-19 NOTE — Telephone Encounter (Signed)
Copied from [place] [id number]. Topic: General - Inquiry >> Oct 19, 2019 11:18 AM Alease Frame wrote: Reason for CRM: Patient is requesting a copy of medications to pick up today if possible. Please contact Patient directly

## 2019-10-31 ENCOUNTER — Encounter: Payer: Self-pay | Admitting: Family Medicine

## 2019-11-15 ENCOUNTER — Encounter: Payer: Medicaid Other | Admitting: Family Medicine

## 2019-11-21 ENCOUNTER — Encounter: Payer: Self-pay | Admitting: Emergency Medicine

## 2019-11-21 ENCOUNTER — Emergency Department: Payer: Self-pay

## 2019-11-21 ENCOUNTER — Other Ambulatory Visit: Payer: Self-pay

## 2019-11-21 ENCOUNTER — Emergency Department
Admission: EM | Admit: 2019-11-21 | Discharge: 2019-11-21 | Disposition: A | Discharge status: Home / Self Care | Payer: Self-pay | Attending: Emergency Medicine | Admitting: Emergency Medicine

## 2019-11-21 DIAGNOSIS — I129 Hypertensive chronic kidney disease with stage 1 through stage 4 chronic kidney disease, or unspecified chronic kidney disease: Secondary | ICD-10-CM | POA: Insufficient documentation

## 2019-11-21 DIAGNOSIS — I2510 Atherosclerotic heart disease of native coronary artery without angina pectoris: Secondary | ICD-10-CM | POA: Insufficient documentation

## 2019-11-21 DIAGNOSIS — Z20828 Contact with and (suspected) exposure to other viral communicable diseases: Secondary | ICD-10-CM | POA: Insufficient documentation

## 2019-11-21 DIAGNOSIS — Z7982 Long term (current) use of aspirin: Secondary | ICD-10-CM | POA: Insufficient documentation

## 2019-11-21 DIAGNOSIS — K529 Noninfective gastroenteritis and colitis, unspecified: Secondary | ICD-10-CM | POA: Insufficient documentation

## 2019-11-21 DIAGNOSIS — N183 Chronic kidney disease, stage 3 unspecified: Secondary | ICD-10-CM | POA: Insufficient documentation

## 2019-11-21 DIAGNOSIS — Z79899 Other long term (current) drug therapy: Secondary | ICD-10-CM | POA: Insufficient documentation

## 2019-11-21 DIAGNOSIS — E1122 Type 2 diabetes mellitus with diabetic chronic kidney disease: Secondary | ICD-10-CM | POA: Insufficient documentation

## 2019-11-21 LAB — URINALYSIS, COMPLETE (UACMP) WITH MICROSCOPIC
Bilirubin Urine: NEGATIVE
Glucose, UA: NEGATIVE mg/dL
Hgb urine dipstick: NEGATIVE
Ketones, ur: NEGATIVE mg/dL
Nitrite: NEGATIVE
Protein, ur: 100 mg/dL — AB
Specific Gravity, Urine: 1.013 (ref 1.005–1.030)
pH: 6 (ref 5.0–8.0)

## 2019-11-21 LAB — CBC
HCT: 37.3 % (ref 36.0–46.0)
Hemoglobin: 12.3 g/dL (ref 12.0–15.0)
MCH: 26.6 pg (ref 26.0–34.0)
MCHC: 33 g/dL (ref 30.0–36.0)
MCV: 80.6 fL (ref 80.0–100.0)
Platelets: 248 10*3/uL (ref 150–400)
RBC: 4.63 MIL/uL (ref 3.87–5.11)
RDW: 13.7 % (ref 11.5–15.5)
WBC: 11.1 10*3/uL — ABNORMAL HIGH (ref 4.0–10.5)
nRBC: 0 % (ref 0.0–0.2)

## 2019-11-21 LAB — COMPREHENSIVE METABOLIC PANEL
ALT: 10 U/L (ref 0–44)
AST: 20 U/L (ref 15–41)
Albumin: 4.1 g/dL (ref 3.5–5.0)
Alkaline Phosphatase: 108 U/L (ref 38–126)
Anion gap: 11 (ref 5–15)
BUN: 12 mg/dL (ref 6–20)
CO2: 23 mmol/L (ref 22–32)
Calcium: 9.3 mg/dL (ref 8.9–10.3)
Chloride: 106 mmol/L (ref 98–111)
Creatinine, Ser: 1.17 mg/dL — ABNORMAL HIGH (ref 0.44–1.00)
GFR calc Af Amer: 59 mL/min — ABNORMAL LOW (ref >60)
GFR calc non Af Amer: 51 mL/min — ABNORMAL LOW (ref >60)
Glucose, Bld: 121 mg/dL — ABNORMAL HIGH (ref 70–99)
Potassium: 3.4 mmol/L — ABNORMAL LOW (ref 3.5–5.1)
Sodium: 140 mmol/L (ref 135–145)
Total Bilirubin: 0.6 mg/dL (ref 0.3–1.2)
Total Protein: 8.3 g/dL — ABNORMAL HIGH (ref 6.5–8.1)

## 2019-11-21 LAB — LIPASE, BLOOD: Lipase: 22 U/L (ref 11–51)

## 2019-11-21 MED ORDER — SODIUM CHLORIDE 0.9 % IV BOLUS
1000.0000 mL | Freq: Once | INTRAVENOUS | Status: AC
  Administered 2019-11-21: 1000 mL via INTRAVENOUS

## 2019-11-21 MED ORDER — IOHEXOL 300 MG/ML  SOLN
100.0000 mL | Freq: Once | INTRAMUSCULAR | Status: AC | PRN
  Administered 2019-11-21: 20:00:00 100 mL via INTRAVENOUS

## 2019-11-21 MED ORDER — POTASSIUM CHLORIDE CRYS ER 20 MEQ PO TBCR
20.0000 meq | EXTENDED_RELEASE_TABLET | Freq: Once | ORAL | Status: AC
  Administered 2019-11-21: 22:00:00 20 meq via ORAL
  Filled 2019-11-21: qty 1

## 2019-11-21 MED ORDER — SODIUM CHLORIDE 0.9% FLUSH
3.0000 mL | Freq: Once | INTRAVENOUS | Status: AC
  Administered 2019-11-21: 3 mL via INTRAVENOUS

## 2019-11-21 MED ORDER — ONDANSETRON HCL 4 MG/2ML IJ SOLN
4.0000 mg | Freq: Once | INTRAMUSCULAR | Status: AC
  Administered 2019-11-21: 4 mg via INTRAVENOUS
  Filled 2019-11-21: qty 2

## 2019-11-21 MED ORDER — ONDANSETRON 4 MG PO TBDP: 4.0000 mg | ORAL_TABLET | Freq: Three times a day (TID) | ORAL | 0 refills | Status: DC | PRN

## 2019-11-21 MED ORDER — KETOROLAC TROMETHAMINE 30 MG/ML IJ SOLN
15.0000 mg | Freq: Once | INTRAMUSCULAR | Status: AC
  Administered 2019-11-21: 15 mg via INTRAVENOUS
  Filled 2019-11-21: qty 1

## 2019-11-21 NOTE — ED Provider Notes (Signed)
[facility] Emergency Department Provider Note  ____________________________________________   First MD Initiated Contact with Patient 11/21/19 1812     (approximate)  I have reviewed the triage vital signs and the nursing notes.   HISTORY  Chief Complaint Emesis and Diarrhea    HPI CANDIE GINTZ is a 58 y.o. female with anemia, diabetes who presents with vomiting diarrhea.  Patient states that since Saturday, 4 days ago she has had intermittent episodes of vomiting that is severe, constant, not better with Zofran, worse after eating.  They are nonbloody nonbilious.  She is also had multiple episodes of diarrhea that are watery in nature.  Had 1 dose of Flagyl with few days prior but denies any other antibiotics recently.  She does have some lower abdominal cramping associated with it as well.          Past Medical History:  Diagnosis Date   Anemia    Chronic renal impairment, stage 3 (moderate)    Diabetes mellitus without complication (HCC)    No longer on meds   GERD (gastroesophageal reflux disease)    Hyperlipidemia    Hypertension    Hypokalemia    Insomnia    Low calcium levels    Osteoarthrosis, hip    right hip   Paresthesia    Sickle cell anemia (HCC)    Sickle-cell trait (HCC)    Tension headache    1x/mo   Wears contact lenses     Patient Active Problem List   Diagnosis Date Noted   Chronic bilateral back pain 07/26/2017   Bilateral shoulder pain 07/26/2017   Coronary artery calcification 05/17/2017   Heart palpitations 05/15/2017   History of acute gastritis    Leukocytosis 09/30/2016   Atherosclerosis of abdominal aorta ([place]) 08/18/2016   Type 2 diabetes mellitus with stage 3 chronic kidney disease, without long-term current use of insulin ([place]) 12/16/2015   Diabetic neuropathy associated with type 2 diabetes mellitus ([place]) 09/18/2015   Insomnia 09/18/2015   Benign essential HTN 06/18/2015    Chronic kidney disease (CKD), stage III (moderate) 06/18/2015   Diabetes mellitus with renal manifestation ([place]) 06/18/2015   Abnormal serum level of alkaline phosphatase 06/18/2015   Bilateral low back pain with left-sided sciatica 06/18/2015   Obesity (BMI 30.0-34.9) 06/18/2015   Degenerative arthritis of hip 06/18/2015   Sickle cell trait ([place]) 06/18/2015   Dyslipidemia 05/27/2010   Gastro-esophageal reflux disease without esophagitis 05/25/2008    Past Surgical History:  Procedure Laterality Date   CHOLECYSTECTOMY     COLONOSCOPY     ESOPHAGOGASTRODUODENOSCOPY (EGD) WITH PROPOFOL N/A 10/05/2016   Procedure: ESOPHAGOGASTRODUODENOSCOPY (EGD) WITH PROPOFOL;  Surgeon: Lucilla Lame, MD;  Location: [facility];  Service: Endoscopy;  Laterality: N/A;   FRACTURE SURGERY Left    cast and pins    SHOULDER ARTHROSCOPY WITH ROTATOR CUFF REPAIR AND SUBACROMIAL DECOMPRESSION Left 12/07/2018   Procedure: left shoulder manipulation under anesthesia, left shoulder arthroscopic lysis of adhesions;  Surgeon: Lovell Sheehan, MD;  Location: [facility];  Service: Orthopedics;  Laterality: Left;   SHOULDER CLOSED REDUCTION Left 12/07/2018   Procedure: CLOSED MANIPULATION SHOULDER;  Surgeon: Lovell Sheehan, MD;  Location: [facility];  Service: Orthopedics;  Laterality: Left;   shoulder surgery  Left 06/10/2018   Dr. Marica Otter   TUBAL LIGATION      Prior to Admission medications   Medication Sig Start Date End Date Taking? Authorizing Provider  aspirin (ASPIRIN 81) 81 MG chewable tablet Chew  1 tablet (81 mg total) by mouth daily. 05/03/18   Steele Sizer, MD  atorvastatin (LIPITOR) 20 MG tablet Take 1 tablet (20 mg total) by mouth daily. 08/11/19   Steele Sizer, MD  Blood Glucose Monitoring Suppl (CONTOUR NEXT ONE) KIT USE TO TEST BLOOD SUGAR ONCE D 04/21/19   [provider]  Butalbital-APAP-Caffeine 50-300-40 MG CAPS Take 1 capsule by mouth every 4 (four) hours as needed.  04/18/19   Steele Sizer, MD  Cholecalciferol (VITAMIN D) 2000 units CAPS Take 1 capsule (2,000 Units total) by mouth daily. 04/20/17   Steele Sizer, MD  cyclobenzaprine (FLEXERIL) 5 MG tablet TAKE 1 TO 2 TABLETS BY MOUTH TWICE DAILY... TAKE 1 IN THE MORNING. AND TAKE 2 TABLETS IN THE AFTERNOON 05/19/19   Steele Sizer, MD  gabapentin (NEURONTIN) 300 MG capsule Take 1 capsule (300 mg total) by mouth 2 (two) times daily. 08/11/19   Steele Sizer, MD  glucose blood test strip Use as instructed 04/21/19   Steele Sizer, MD  HYDROcodone-acetaminophen (NORCO/VICODIN) 5-325 MG tablet TK 1 T PO Q 6 TO 8 H PRN 06/20/19   [provider]  lisinopril (ZESTRIL) 10 MG tablet Take 1 tablet (10 mg total) by mouth daily. 08/11/19   Steele Sizer, MD  magnesium oxide (MAGNESIUM-OXIDE) 400 (241.3 Mg) MG tablet Take 1 tablet (400 mg total) by mouth 2 (two) times daily. 04/18/19   Steele Sizer, MD  meloxicam (MOBIC) 15 MG tablet TK 1 T PO QD 03/27/19   Lovell Sheehan, MD  Microlet Lancets MISC USE TO CHECK BLOOD SUGAR ONCE D 04/21/19   [provider]  omeprazole (PRILOSEC) 20 MG capsule Take 1 capsule (20 mg total) by mouth daily. 08/11/19   Steele Sizer, MD  promethazine (PHENERGAN) 12.5 MG tablet Take 1 tablet (12.5 mg total) by mouth every 6 (six) hours as needed for nausea or vomiting. 10/11/19   Steele Sizer, MD  RA COL-RITE 100 MG capsule Take 100 mg by mouth 2 (two) times daily as needed for mild constipation.  06/14/18   [provider]  Scar Treatment Products ([place] SPF 30) CREA APPLY 1 GRAM BID PRN. 06/09/19   [provider]  Semaglutide,0.25 or 0.5MG/DOS, (OZEMPIC, 0.25 OR 0.5 MG/DOSE,) 2 MG/1.5ML SOPN Inject 0.5 mg into the muscle every Monday. 08/14/19   Steele Sizer, MD  temazepam (RESTORIL) 15 MG capsule Take 1 capsule (15 mg total) by mouth as needed. 01/09/19   Steele Sizer, MD    Allergies Patient has no known allergies.  Family History    Problem Relation Age of Onset   Migraines Mother    Diabetes Mother    Cancer Mother        lung   Arthritis Brother    Breast cancer Maternal Aunt    Cancer Maternal Uncle        Lung and Colon   Cirrhosis Brother    Breast cancer Cousin     Social History Social History   Tobacco Use   Smoking status: Never Smoker   Smokeless tobacco: Never Used  Substance Use Topics   Alcohol use: Yes    Alcohol/week: 0.0 standard drinks    Comment: rare   Drug use: Yes    Types: Marijuana    Comment: smokes marijuana occasionally      Review of Systems Constitutional: No fever/chills Eyes: No visual changes. ENT: No sore throat. Cardiovascular: Denies chest pain. Respiratory: Denies shortness of breath. Gastrointestinal: Positive abdominal pain, nausea, vomiting no diarrhea.  No constipation. Genitourinary: Negative for dysuria. Musculoskeletal: Negative for back pain. Skin: Negative for rash. Neurological: Negative for headaches, focal weakness or numbness. All other ROS negative ____________________________________________   PHYSICAL EXAM:  VITAL SIGNS: ED Triage Vitals  Enc Vitals Group     BP 11/21/19 1531 112/68     Pulse Rate 11/21/19 1531 62     Resp 11/21/19 1531 16     Temp 11/21/19 1531 98.8 F (37.1 C)     Temp Source 11/21/19 1531 Oral     SpO2 11/21/19 1531 100 %     Weight 11/21/19 1528 147 lb (66.7 kg)     Height 11/21/19 1528 5' 3.5" (1.613 m)     Head Circumference --      Peak Flow --      Pain Score 11/21/19 1528 0     Pain Loc --      Pain Edu? --      Excl. in [place]? --     Constitutional: Alert and oriented. Well appearing and in no acute distress. Eyes: Conjunctivae are normal. EOMI. Head: Atraumatic. Nose: No congestion/rhinnorhea. Mouth/Throat: Mucous membranes are moist.   Neck: No stridor. Trachea Midline. FROM Cardiovascular: Normal rate, regular rhythm. Grossly normal heart sounds.  Good peripheral  circulation. Respiratory: Normal respiratory effort.  No retractions. Lungs CTAB. Gastrointestinal: Soft with slight tenderness in her abdomen.. No distention. No abdominal bruits.  Musculoskeletal: No lower extremity tenderness nor edema.  No joint effusions. Neurologic:  Normal speech and language. No gross focal neurologic deficits are appreciated.  Skin:  Skin is warm, dry and intact. No rash noted. Psychiatric: Mood and affect are normal. Speech and behavior are normal. GU: Deferred   ____________________________________________   LABS (all labs ordered are listed, but only abnormal results are displayed)  Labs Reviewed  COMPREHENSIVE METABOLIC PANEL - Abnormal; Notable for the following components:      Result Value   Potassium 3.4 (*)    Glucose, Bld 121 (*)    Creatinine, Ser 1.17 (*)    Total Protein 8.3 (*)    GFR calc non Af Amer 51 (*)    GFR calc Af Amer 59 (*)    All other components within normal limits  CBC - Abnormal; Notable for the following components:   WBC 11.1 (*)    All other components within normal limits  URINALYSIS, COMPLETE (UACMP) WITH MICROSCOPIC - Abnormal; Notable for the following components:   Color, Urine YELLOW (*)    APPearance CLEAR (*)    Protein, ur 100 (*)    Leukocytes,Ua SMALL (*)    Bacteria, UA RARE (*)    All other components within normal limits  GASTROINTESTINAL PANEL BY PCR, STOOL (REPLACES STOOL CULTURE)  C DIFFICILE QUICK SCREEN W PCR REFLEX  SARS CORONAVIRUS 2 (TAT 6-24 HRS)  LIPASE, BLOOD   ____________________________________________   ED ECG REPORT I, Vanessa Clayton, the attending physician, personally viewed and interpreted this ECG.  EKG is sinus bradycardia rate of 53, no ST elevation, T wave inversion in aVL, normal intervals ____________________________________________  RADIOLOGY Robert Bellow, personally viewed and evaluated these images (plain radiographs) as part of my medical decision making, as well  as reviewing the written report by the radiologist.    Official radiology report(s): Ct Abdomen Pelvis W Contrast  Result Date: 11/21/2019 CLINICAL DATA:  Nausea and vomiting with abdominal distention. EXAM: CT ABDOMEN AND PELVIS WITH CONTRAST TECHNIQUE: Multidetector CT imaging of the abdomen and pelvis was  performed using the standard protocol following bolus administration of intravenous contrast. CONTRAST:  134m OMNIPAQUE IOHEXOL 300 MG/ML  SOLN COMPARISON:  A 01/17/2016 FINDINGS: Lower chest: The lung bases are clear. The heart size is normal. Hepatobiliary: The liver is normal. Status post cholecystectomy.There is no biliary ductal dilation. Pancreas: Normal contours without ductal dilatation. No peripancreatic fluid collection. Spleen: No splenic laceration or hematoma. Adrenals/Urinary Tract: --Adrenal glands: No adrenal hemorrhage. --Right kidney/ureter: No hydronephrosis or perinephric hematoma. --Left kidney/ureter: No hydronephrosis or perinephric hematoma. --Urinary bladder: Unremarkable. Stomach/Bowel: --Stomach/Duodenum: There appears to be some wall thickening of the gastric antrum. --Small bowel: No dilatation or inflammation. --Colon: No focal abnormality. --Appendix: Normal. Vascular/Lymphatic: Atherosclerotic calcification is present within the non-aneurysmal abdominal aorta, without hemodynamically significant stenosis. --No retroperitoneal lymphadenopathy. --No mesenteric lymphadenopathy. --No pelvic or inguinal lymphadenopathy. Reproductive: The patient is status post bilateral tubal ligation. Other: No ascites or free air. The abdominal wall is normal. Musculoskeletal. No acute displaced fractures. IMPRESSION: 1. No acute abdominopelvic abnormality. 2. There appears to be some wall thickening of the gastric antrum. This could be secondary to incomplete distention, but gastritis is not excluded. Correlation with patient's symptomatology is recommended. Aortic Atherosclerosis  (ICD10-I70.0). Electronically Signed   By: CConstance HolsterM.D.   On: 11/21/2019 20:40    ____________________________________________   PROCEDURES  Procedure(s) performed (including Critical Care):  Procedures   ____________________________________________   INITIAL IMPRESSION / ASSESSMENT AND PLAN / ED COURSE  LKALEYA DOUSEwas evaluated in Emergency Department on 11/21/2019 for the symptoms described in the history of present illness. She was evaluated in the context of the global COVID-19 pandemic, which necessitated consideration that the patient might be at risk for infection with the SARS-CoV-2 virus that causes COVID-19. Institutional protocols and algorithms that pertain to the evaluation of patients at risk for COVID-19 are in a state of rapid change based on information released by regulatory bodies including the CDC and federal and state organizations. These policies and algorithms were followed during the patient's care in the ED.    Patient is a 58year old who presents with vomiting and diarrhea.  Will get labs evaluate for DKA, electrolyte abnormalities, AKI.  Patient able to get of a stool sample will send for bacterial versus C. difficile.  Discussed with patient CT imaging patient would prefer CT imaging to rule out colitis, SBO, diverticulitis given her abdominal pain.  UA without evidence of UTI although does have some protein in her urine.  Kidney function is 1.17 around baseline.  White count slightly elevated 11.1  CT scan negative except for possible under distention of her stomach versus gastritis.  Patient already on a PPI.  Will give GI follow-up as needed.  Patient not been unable to give a stool sample.  Patient agreeable to hold off on antibiotics given she has had no vomiting or diarrhea while here in the emergency room and she is very well-appearing.  We will send coronavirus testing.  She will stay quarantine at home until comes back.  We will send her  home with a course of Zofran.    I discussed the provisional nature of ED diagnosis, the treatment so far, the ongoing plan of care, follow up appointments and return precautions with the patient and any family or support people present. They expressed understanding and agreed with the plan, discharged home.   ____________________________________________   FINAL CLINICAL IMPRESSION(S) / ED DIAGNOSES   Final diagnoses:  Gastroenteritis      MEDICATIONS GIVEN DURING THIS  VISIT:  Medications  ketorolac (TORADOL) 30 MG/ML injection 15 mg (has no administration in time range)  potassium chloride SA (KLOR-CON) CR tablet 20 mEq (has no administration in time range)  sodium chloride flush (NS) 0.9 % injection 3 mL (3 mLs Intravenous Given 11/21/19 2000)  sodium chloride 0.9 % bolus 1,000 mL (1,000 mLs Intravenous New Bag/Given 11/21/19 2000)  ondansetron (ZOFRAN) injection 4 mg (4 mg Intravenous Given 11/21/19 1959)  iohexol (OMNIPAQUE) 300 MG/ML solution 100 mL (100 mLs Intravenous Contrast Given 11/21/19 2024)     ED Discharge Orders         Ordered    ondansetron (ZOFRAN ODT) 4 MG disintegrating tablet  Every 8 hours PRN     11/21/19 2059           Note:  This document was prepared using Dragon voice recognition software and may include unintentional dictation errors.   Vanessa [place], MD 11/21/19 2100

## 2019-11-21 NOTE — ED Notes (Signed)
Pt uprite on stretcher in exam room with no distress noted; pt reports generalized abd cramping accomp by N/V/D x 2 days; +BS, abd soft/nondist, diffuse tenderness with palpation; pt voices good understanding of plan of care

## 2019-11-21 NOTE — Discharge Instructions (Signed)
I think that most likely this is secondary to some viral infection.  We are giving you some nausea medicine to help.  You are unable to give [place] a stool sample but your CT scan was reassuring.  There was some concern for gastritis.  Follow-up with GI as needed.  Return to ER for worsening pain, fevers, vomiting or any other concerns.

## 2019-11-21 NOTE — ED Notes (Signed)
To CT via stretcher accomp by CT tech 

## 2019-11-21 NOTE — ED Triage Notes (Signed)
C/O N/V/D x 2 days.  States unable to keep anything down.  Denies fever, cough.  AAOx3.  Skin warm and dry. NAD

## 2019-11-22 LAB — SARS CORONAVIRUS 2 (TAT 6-24 HRS): SARS Coronavirus 2: NEGATIVE

## 2019-12-28 HISTORY — PX: BREAST BIOPSY: SHX20

## 2020-01-17 ENCOUNTER — Ambulatory Visit: Payer: Medicaid Other | Admitting: Gastroenterology

## 2020-01-29 ENCOUNTER — Other Ambulatory Visit: Payer: Self-pay

## 2020-01-29 ENCOUNTER — Encounter: Payer: Self-pay | Admitting: Emergency Medicine

## 2020-01-29 ENCOUNTER — Observation Stay
Admission: EM | Admit: 2020-01-29 | Discharge: 2020-01-30 | Disposition: A | Discharge status: Home / Self Care | Payer: Medicaid Other | Attending: Internal Medicine | Admitting: Internal Medicine

## 2020-01-29 DIAGNOSIS — E1122 Type 2 diabetes mellitus with diabetic chronic kidney disease: Secondary | ICD-10-CM | POA: Insufficient documentation

## 2020-01-29 DIAGNOSIS — Z79899 Other long term (current) drug therapy: Secondary | ICD-10-CM | POA: Insufficient documentation

## 2020-01-29 DIAGNOSIS — T464X5A Adverse effect of angiotensin-converting-enzyme inhibitors, initial encounter: Secondary | ICD-10-CM | POA: Diagnosis present

## 2020-01-29 DIAGNOSIS — I10 Essential (primary) hypertension: Secondary | ICD-10-CM

## 2020-01-29 DIAGNOSIS — N183 Chronic kidney disease, stage 3 unspecified: Secondary | ICD-10-CM | POA: Insufficient documentation

## 2020-01-29 DIAGNOSIS — Z20822 Contact with and (suspected) exposure to covid-19: Secondary | ICD-10-CM | POA: Insufficient documentation

## 2020-01-29 DIAGNOSIS — E785 Hyperlipidemia, unspecified: Secondary | ICD-10-CM | POA: Insufficient documentation

## 2020-01-29 DIAGNOSIS — T445X5A Adverse effect of predominantly beta-adrenoreceptor agonists, initial encounter: Secondary | ICD-10-CM | POA: Insufficient documentation

## 2020-01-29 DIAGNOSIS — I700 Atherosclerosis of aorta: Secondary | ICD-10-CM | POA: Insufficient documentation

## 2020-01-29 DIAGNOSIS — I129 Hypertensive chronic kidney disease with stage 1 through stage 4 chronic kidney disease, or unspecified chronic kidney disease: Secondary | ICD-10-CM | POA: Insufficient documentation

## 2020-01-29 DIAGNOSIS — Z6824 Body mass index (BMI) 24.0-24.9, adult: Secondary | ICD-10-CM | POA: Insufficient documentation

## 2020-01-29 DIAGNOSIS — Z791 Long term (current) use of non-steroidal anti-inflammatories (NSAID): Secondary | ICD-10-CM | POA: Insufficient documentation

## 2020-01-29 DIAGNOSIS — K219 Gastro-esophageal reflux disease without esophagitis: Secondary | ICD-10-CM | POA: Insufficient documentation

## 2020-01-29 DIAGNOSIS — Z7982 Long term (current) use of aspirin: Secondary | ICD-10-CM | POA: Insufficient documentation

## 2020-01-29 DIAGNOSIS — E1140 Type 2 diabetes mellitus with diabetic neuropathy, unspecified: Secondary | ICD-10-CM | POA: Insufficient documentation

## 2020-01-29 DIAGNOSIS — I2510 Atherosclerotic heart disease of native coronary artery without angina pectoris: Secondary | ICD-10-CM | POA: Insufficient documentation

## 2020-01-29 DIAGNOSIS — D573 Sickle-cell trait: Secondary | ICD-10-CM | POA: Insufficient documentation

## 2020-01-29 DIAGNOSIS — T783XXA Angioneurotic edema, initial encounter: Principal | ICD-10-CM | POA: Insufficient documentation

## 2020-01-29 LAB — CBC
HCT: 38.5 % (ref 36.0–46.0)
Hemoglobin: 12.6 g/dL (ref 12.0–15.0)
MCH: 26.6 pg (ref 26.0–34.0)
MCHC: 32.7 g/dL (ref 30.0–36.0)
MCV: 81.2 fL (ref 80.0–100.0)
Platelets: 242 10*3/uL (ref 150–400)
RBC: 4.74 MIL/uL (ref 3.87–5.11)
RDW: 14.4 % (ref 11.5–15.5)
WBC: 8.8 10*3/uL (ref 4.0–10.5)
nRBC: 0 % (ref 0.0–0.2)

## 2020-01-29 LAB — GLUCOSE, CAPILLARY: Glucose-Capillary: 96 mg/dL (ref 70–99)

## 2020-01-29 MED ORDER — ASPIRIN 81 MG PO CHEW
81.0000 mg | CHEWABLE_TABLET | Freq: Every day | ORAL | Status: DC
  Administered 2020-01-30: 81 mg via ORAL
  Filled 2020-01-29: qty 1

## 2020-01-29 MED ORDER — PANTOPRAZOLE SODIUM 40 MG PO TBEC
40.0000 mg | DELAYED_RELEASE_TABLET | Freq: Every day | ORAL | Status: DC
  Administered 2020-01-30: 10:00:00 40 mg via ORAL
  Filled 2020-01-29: qty 1

## 2020-01-29 MED ORDER — PREDNISONE 20 MG PO TABS
20.0000 mg | ORAL_TABLET | Freq: Three times a day (TID) | ORAL | Status: AC
  Administered 2020-01-29 – 2020-01-30 (×2): 20 mg via ORAL
  Filled 2020-01-29: qty 1

## 2020-01-29 MED ORDER — SODIUM CHLORIDE 0.9% FLUSH
3.0000 mL | Freq: Two times a day (BID) | INTRAVENOUS | Status: DC
  Administered 2020-01-30: 07:00:00 3 mL via INTRAVENOUS

## 2020-01-29 MED ORDER — METHYLPREDNISOLONE SODIUM SUCC 125 MG IJ SOLR
125.0000 mg | INTRAMUSCULAR | Status: AC
  Administered 2020-01-29: 125 mg via INTRAVENOUS
  Filled 2020-01-29: qty 2

## 2020-01-29 MED ORDER — SODIUM CHLORIDE 0.9 % IV SOLN: 250.0000 mL | INTRAVENOUS | Status: DC | PRN

## 2020-01-29 MED ORDER — TEMAZEPAM 15 MG PO CAPS: 15.0000 mg | ORAL_CAPSULE | Freq: Every day | ORAL | Status: DC

## 2020-01-29 MED ORDER — SEMAGLUTIDE(0.25 OR 0.5MG/DOS) 2 MG/1.5ML ~~LOC~~ SOPN: 0.5000 mg | PEN_INJECTOR | SUBCUTANEOUS | Status: DC

## 2020-01-29 MED ORDER — DILTIAZEM HCL ER COATED BEADS 180 MG PO CP24
180.0000 mg | ORAL_CAPSULE | Freq: Every day | ORAL | Status: DC
  Administered 2020-01-30: 180 mg via ORAL
  Filled 2020-01-29: qty 1

## 2020-01-29 MED ORDER — HYDROCODONE-ACETAMINOPHEN 5-325 MG PO TABS: 1.0000 | ORAL_TABLET | Freq: Four times a day (QID) | ORAL | Status: DC | PRN

## 2020-01-29 MED ORDER — SODIUM CHLORIDE 0.9% FLUSH: 3.0000 mL | INTRAVENOUS | Status: DC | PRN

## 2020-01-29 MED ORDER — MELOXICAM 7.5 MG PO TABS
7.5000 mg | ORAL_TABLET | Freq: Every day | ORAL | Status: DC
  Filled 2020-01-29: qty 1

## 2020-01-29 MED ORDER — INSULIN ASPART 100 UNIT/ML ~~LOC~~ SOLN: 0.0000 [IU] | Freq: Three times a day (TID) | SUBCUTANEOUS | Status: DC

## 2020-01-29 MED ORDER — LORATADINE 10 MG PO TABS
10.0000 mg | ORAL_TABLET | Freq: Two times a day (BID) | ORAL | Status: DC
  Administered 2020-01-30: 10 mg via ORAL
  Filled 2020-01-29: qty 1

## 2020-01-29 MED ORDER — ATORVASTATIN CALCIUM 20 MG PO TABS
20.0000 mg | ORAL_TABLET | Freq: Every day | ORAL | Status: DC
  Administered 2020-01-30: 20 mg via ORAL
  Filled 2020-01-29: qty 1

## 2020-01-29 MED ORDER — ENOXAPARIN SODIUM 40 MG/0.4ML ~~LOC~~ SOLN
40.0000 mg | SUBCUTANEOUS | Status: DC
  Filled 2020-01-29: qty 0.4

## 2020-01-29 NOTE — ED Notes (Signed)
No change in condition after meds   md aware.

## 2020-01-29 NOTE — ED Triage Notes (Signed)
Pt to ED by EMS from [facility] where patient was seen for an "allergic reaction" that occurred yesterday after eating eggs. Pt took Benadryl yesterday w/o relief and went to [facility] today for care. Pt given EPI at [facility] without relief. Pt's breathing normal and unlabored at this time. Pt Sat 99% on RA.

## 2020-01-29 NOTE — H&P (Signed)
History and Physical    Brandy Johnston SWF:[id number] DOB: 1961-04-21 DOA: 01/29/2020  PCP: [facility] [place] (Confirm with patient/family/NH records and if not entered, this has to be entered at [facility] point of entry) Patient coming from: home  I have personally briefly reviewed patient's old medical records in [place]  Chief Complaint: swelling lips  HPI: Brandy Johnston is a 59 y.o. female with medical history significant of hypertension sickle cell trait  Patient reports that yesterday she began to notice that she is having swelling of her upper and lower lips.  No other symptoms.  No trouble breathing.  No fevers or chills.  No hives or itching.  She went to the clinic today to have this evaluated, she was given Benadryl by EMS as well as received an epinephrine shot as she is having lip swelling  She reports these do not seem to have helped, but since yesterday her swelling has actually gone down just slightly.  She does take lisinopril and has been on this for quite some time as well.  Denies swelling or any other symptoms elsewhere.  No wheezing no difficulty breathing.  No trouble swallowing.  She did notice a little bit of swelling starting in her left neck earlier today as well, that seems to still be present  (For level 3, the HPI must include 4+ descriptors: Location, Quality, Severity, Duration, Timing, Context, modifying factors, associated signs/symptoms and/or status of 3+ chronic problems.)  (Please avoid self-populating past medical history here) (The initial 2-3 lines should be focused and good to copy and paste in the HPI section of the daily progress note).  ED Course: Hemodynamically stable. No labs drawn. Patient given solumedrol. Referred to [facility] for observation to insure resolution w/o worsening of angioedema  Review of Systems: As per HPI otherwise 10 point review of systems negative.  Unacceptable ROS statements: "10 systems  reviewed," "Extensive" (without elaboration).  Acceptable ROS statements: "All others negative," "All others reviewed and are negative," and "All others unremarkable," with at [place] documented Can't double dip - if using for HPI can't use for ROS  Past Medical History:  Diagnosis Date  . Anemia   . Chronic renal impairment, stage 3 (moderate)   . Diabetes mellitus without complication ([place])    No longer on meds  . GERD (gastroesophageal reflux disease)   . Hyperlipidemia   . Hypertension   . Hypokalemia   . Insomnia   . Low calcium levels   . Osteoarthrosis, hip    right hip  . Paresthesia   . Sickle cell anemia (HCC)   . Sickle-cell trait ([place])   . Tension headache    1x/mo  . Wears contact lenses     Past Surgical History:  Procedure Laterality Date  . CHOLECYSTECTOMY    . COLONOSCOPY    . ESOPHAGOGASTRODUODENOSCOPY (EGD) WITH PROPOFOL N/A 10/05/2016   Procedure: ESOPHAGOGASTRODUODENOSCOPY (EGD) WITH PROPOFOL;  Surgeon: Lucilla Lame, MD;  Location: [place];  Service: Endoscopy;  Laterality: N/A;  . FRACTURE SURGERY Left    cast and pins   . SHOULDER ARTHROSCOPY WITH ROTATOR CUFF REPAIR AND SUBACROMIAL DECOMPRESSION Left 12/07/2018   Procedure: left shoulder manipulation under anesthesia, left shoulder arthroscopic lysis of adhesions;  Surgeon: Lovell Sheehan, MD;  Location: [facility];  Service: Orthopedics;  Laterality: Left;  . SHOULDER CLOSED REDUCTION Left 12/07/2018   Procedure: CLOSED MANIPULATION SHOULDER;  Surgeon: Lovell Sheehan, MD;  Location: [facility];  Service: Orthopedics;  Laterality: Left;  . shoulder surgery  Left 06/10/2018   Dr. Marica Otter  . TUBAL LIGATION     Soc Hx - never married. One son, 1 daughter, 2 grand-daughters. Worked 13 years for [employer] then was injured at work. Currently unemployed. Lives alone. Put both children through college and they are doing well.   reports that she has never smoked. She has never  used smokeless tobacco. She reports current alcohol use. She reports current drug use. Drug: Marijuana.  Allergies  Allergen Reactions  . Ace Inhibitors Swelling    Family History  Problem Relation Age of Onset  . Migraines Mother   . Diabetes Mother   . Cancer Mother        lung  . Arthritis Brother   . Breast cancer Maternal Aunt   . Cancer Maternal Uncle        Lung and Colon  . Cirrhosis Brother   . Breast cancer Cousin    Unacceptable: Noncontributory, unremarkable, or negative. Acceptable: Family history reviewed and not pertinent (If you reviewed it)  Prior to Admission medications   Medication Sig Start Date End Date Taking? Authorizing Provider  aspirin (ASPIRIN 81) 81 MG chewable tablet Chew 1 tablet (81 mg total) by mouth daily. 05/03/18  Yes Sowles, Drue Stager, MD  atorvastatin (LIPITOR) 20 MG tablet Take 1 tablet (20 mg total) by mouth daily. 08/11/19  Yes Sowles, Drue Stager, MD  Cholecalciferol (VITAMIN D) 2000 units CAPS Take 1 capsule (2,000 Units total) by mouth daily. 04/20/17  Yes Sowles, Drue Stager, MD  cyclobenzaprine (FLEXERIL) 5 MG tablet TAKE 1 TO 2 TABLETS BY MOUTH TWICE DAILY... TAKE 1 IN THE MORNING. AND TAKE 2 TABLETS IN THE AFTERNOON 05/19/19  Yes Sowles, Drue Stager, MD  gabapentin (NEURONTIN) 300 MG capsule Take 1 capsule (300 mg total) by mouth 2 (two) times daily. 08/11/19  Yes Sowles, Drue Stager, MD  magnesium oxide (MAGNESIUM-OXIDE) 400 (241.3 Mg) MG tablet Take 1 tablet (400 mg total) by mouth 2 (two) times daily. 04/18/19  Yes Sowles, Drue Stager, MD  omeprazole (PRILOSEC) 20 MG capsule Take 1 capsule (20 mg total) by mouth daily. 08/11/19  Yes Sowles, Drue Stager, MD  Semaglutide,0.25 or 0.5MG/DOS, (OZEMPIC, 0.25 OR 0.5 MG/DOSE,) 2 MG/1.5ML SOPN Inject 0.5 mg into the muscle every Monday. 08/14/19  Yes Sowles, Drue Stager, MD  temazepam (RESTORIL) 15 MG capsule Take 1 capsule (15 mg total) by mouth as needed. 01/09/19  Yes Sowles, Drue Stager, MD  Blood Glucose Monitoring Suppl (CONTOUR  NEXT ONE) KIT USE TO TEST BLOOD SUGAR ONCE D 04/21/19   [provider]  Butalbital-APAP-Caffeine 50-300-40 MG CAPS Take 1 capsule by mouth every 4 (four) hours as needed. 04/18/19   Steele Sizer, MD  glucose blood test strip Use as instructed 04/21/19   Steele Sizer, MD  HYDROcodone-acetaminophen (NORCO/VICODIN) 5-325 MG tablet TK 1 T PO Q 6 TO 8 H PRN 06/20/19   [provider]  meloxicam (MOBIC) 15 MG tablet TK 1 T PO QD 03/27/19   Lovell Sheehan, MD  Microlet Lancets MISC USE TO CHECK BLOOD SUGAR ONCE D 04/21/19   [provider]  ondansetron (ZOFRAN ODT) 4 MG disintegrating tablet Take 1 tablet (4 mg total) by mouth every 8 (eight) hours as needed for nausea or vomiting. Patient not taking: Reported on 01/29/2020 11/21/19   Vanessa Kerhonkson, MD  promethazine (PHENERGAN) 12.5 MG tablet Take 1 tablet (12.5 mg total) by mouth every 6 (six) hours as needed for nausea or  vomiting. 10/11/19   Steele Sizer, MD  RA COL-RITE 100 MG capsule Take 100 mg by mouth 2 (two) times daily as needed for mild constipation.  06/14/18   [provider]  Scar Treatment Products ([place] SPF 30) CREA APPLY 1 GRAM BID PRN. 06/09/19   [provider]  lisinopril (ZESTRIL) 10 MG tablet Take 1 tablet (10 mg total) by mouth daily. 08/11/19 01/29/20  Steele Sizer, MD    Physical Exam: Vitals:   01/29/20 1747 01/29/20 1800 01/29/20 1830 01/29/20 1900  BP:  138/80  119/68  Pulse: 71 79 67 71  Resp: 15 19 (!) 26 15  Temp:      TempSrc:      SpO2: 100% 98% 100% 99%  Weight:      Height:        Constitutional: NAD, calm, comfortable Vitals:   01/29/20 1747 01/29/20 1800 01/29/20 1830 01/29/20 1900  BP:  138/80  119/68  Pulse: 71 79 67 71  Resp: 15 19 (!) 26 15  Temp:      TempSrc:      SpO2: 100% 98% 100% 99%  Weight:      Height:       General: WNWD woman in no distress Eyes: PERRL, lids and conjunctivae normal ENMT: Mucous membranes are moist. Lips are  moderately swollen. No swelling of the tongue. Posterior pharynx clear of any exudate or lesions.Normal dentition. Normal speech and ability to swallow.  Neck: normal, supple, no masses, no thyromegaly Respiratory: clear to auscultation bilaterally, no wheezing, no crackles. Normal respiratory effort. No accessory muscle use.  Cardiovascular: Regular rate and rhythm, no murmurs / rubs / gallops. No extremity edema. 2+ pedal pulses. No carotid bruits.  Abdomen: no tenderness, no masses palpated. No hepatosplenomegaly. Bowel sounds positive.  Musculoskeletal: no clubbing / cyanosis. No joint deformity upper and lower extremities. Good ROM, no contractures. Normal muscle tone.  Skin: no rashes, lesions, ulcers. No induration Neurologic: CN 2-12 grossly intact. Sensation intact, DTR normal. Strength 5/5 in all 4.  Psychiatric: Normal judgment and insight. Alert and oriented x 3. Normal mood.   (Anything < 9 systems with 2 bullets each down codes to level 1) (If patient refuses exam can't bill higher level) (Make sure to document decubitus ulcers present on admission -- if possible -- and whether patient has chronic indwelling catheter at time of admission)  Labs on Admission: I have personally reviewed following labs and imaging studies  CBC: No results for input(s): WBC, NEUTROABS, HGB, HCT, MCV, PLT in the last 168 hours. Basic Metabolic Panel: No results for input(s): NA, K, CL, CO2, GLUCOSE, BUN, CREATININE, CALCIUM, MG, PHOS in the last 168 hours. GFR: CrCl cannot be calculated (Patient's most recent lab result is older than the maximum 21 days allowed.). Liver Function Tests: No results for input(s): AST, ALT, ALKPHOS, BILITOT, PROT, ALBUMIN in the last 168 hours. No results for input(s): LIPASE, AMYLASE in the last 168 hours. No results for input(s): AMMONIA in the last 168 hours. Coagulation Profile: No results for input(s): INR, PROTIME in the last 168 hours. Cardiac Enzymes: No  results for input(s): CKTOTAL, CKMB, CKMBINDEX, TROPONINI in the last 168 hours. BNP (last 3 results) No results for input(s): PROBNP in the last 8760 hours. HbA1C: No results for input(s): HGBA1C in the last 72 hours. CBG: No results for input(s): GLUCAP in the last 168 hours. Lipid Profile: No results for input(s): CHOL, HDL, LDLCALC, TRIG, CHOLHDL, LDLDIRECT in the last 72 hours.  Thyroid Function Tests: No results for input(s): TSH, T4TOTAL, FREET4, T3FREE, THYROIDAB in the last 72 hours. Anemia Panel: No results for input(s): VITAMINB12, FOLATE, FERRITIN, TIBC, IRON, RETICCTPCT in the last 72 hours. Urine analysis:    Component Value Date/Time   COLORURINE YELLOW (A) 11/21/2019 1535   APPEARANCEUR CLEAR (A) 11/21/2019 1535   APPEARANCEUR Clear 07/08/2012 0043   LABSPEC 1.013 11/21/2019 1535   LABSPEC 1.020 07/08/2012 0043   PHURINE 6.0 11/21/2019 1535   GLUCOSEU NEGATIVE 11/21/2019 1535   GLUCOSEU Negative 07/08/2012 0043   HGBUR NEGATIVE 11/21/2019 1535   BILIRUBINUR NEGATIVE 11/21/2019 1535   BILIRUBINUR Negative 07/08/2012 0043   KETONESUR NEGATIVE 11/21/2019 1535   PROTEINUR 100 (A) 11/21/2019 1535   NITRITE NEGATIVE 11/21/2019 1535   LEUKOCYTESUR SMALL (A) 11/21/2019 1535   LEUKOCYTESUR Negative 07/08/2012 0043    Radiological Exams on Admission: No results found.  EKG: Independently reviewed. No EKG  Assessment/Plan Active Problems:   Angioedema due to angiotensin converting enzyme inhibitor (ACE-I)   Benign essential HTN   Type 2 diabetes mellitus with stage 3 chronic kidney disease, without long-term current use of insulin ([place])  (please populate well all problems here in Problem List. (For example, if patient is on BP meds at home and you resume or decide to hold them, it is a problem that needs to be her. Same for CAD, COPD, HLD and so on)   1. Angioedema - most likely related to ACE. No swallow or respiratory distress Plan obs admit  Prednisone orally  for two doses  Loratadine 10 mg BID and should continue for at least two weeks  2. HTN - stopped ACE Plan Replace ACE with CCB - diltiazem 24 hr 180 mg daily  3. DM-  Weekly injection per record Plan A1C  Ss coverage - on steroids.    DVT prophylaxis: lovenox (Lovenox/Heparin/SCD's/anticoagulated/None (if comfort care) Code Status: full code (Full/Partial (specify details) Family Communication: called sister and gave full report (Specify name, relationship. Do not write "discussed with patient". Specify tel # if discussed over the phone) Disposition Plan: home (specify when and where you expect patient to be discharged) Consults called: none (with names) Admission status: obs (inpatient / obs / tele / medical floor / SDU)   Adella Hare MD [facility] Pager [phone]  If 7PM-7AM, please contact night-coverage [email or web address] Password [facility]  01/29/2020, 8:46 PM

## 2020-01-29 NOTE — ED Notes (Signed)
Pt brought in via ems from [facility].  Pt has swelling to lips since yesterday.  Pt has a scratchy throat  No resp distress.  No diff swallowing.  md at bedside. Iv started and labs sent.  Pt is on  Lisinipril.  Pt alert  Speech clear.

## 2020-01-29 NOTE — ED Provider Notes (Signed)
[facility] Emergency Department Provider Note   ____________________________________________   First MD Initiated Contact with Patient 01/29/20 1628     (approximate)  I have reviewed the triage vital signs and the nursing notes.   HISTORY  Chief Complaint Allergic Reaction    HPI Brandy Johnston is a 59 y.o. female history of hypertension sickle cell trait  Patient reports that yesterday she began to notice that she is having swelling of her upper and lower lips.  No other symptoms.  No trouble breathing.  No fevers or chills.  No hives or itching.  She went to the clinic today to have this evaluated, she was given Benadryl by EMS as well as received an epinephrine shot as she is having lip swelling  She reports these do not seem to have helped, but since yesterday her swelling has actually gone down just slightly.  She does take lisinopril and has been on this for quite some time as well.  Denies swelling or any other symptoms elsewhere.  No wheezing no difficulty breathing.  No trouble swallowing.  She did notice a little bit of swelling starting in her left neck earlier today as well, that seems to still be present    Past Medical History:  Diagnosis Date  . Anemia   . Chronic renal impairment, stage 3 (moderate)   . Diabetes mellitus without complication ([place])    No longer on meds  . GERD (gastroesophageal reflux disease)   . Hyperlipidemia   . Hypertension   . Hypokalemia   . Insomnia   . Low calcium levels   . Osteoarthrosis, hip    right hip  . Paresthesia   . Sickle cell anemia (HCC)   . Sickle-cell trait ([place])   . Tension headache    1x/mo  . Wears contact lenses     Patient Active Problem List   Diagnosis Date Noted  . Chronic bilateral back pain 07/26/2017  . Bilateral shoulder pain 07/26/2017  . Coronary artery calcification 05/17/2017  . Heart palpitations 05/15/2017  . History of acute gastritis   . Leukocytosis  09/30/2016  . Atherosclerosis of abdominal aorta ([place]) 08/18/2016  . Type 2 diabetes mellitus with stage 3 chronic kidney disease, without long-term current use of insulin ([place]) 12/16/2015  . Diabetic neuropathy associated with type 2 diabetes mellitus ([place]) 09/18/2015  . Insomnia 09/18/2015  . Benign essential HTN 06/18/2015  . Chronic kidney disease (CKD), stage III (moderate) 06/18/2015  . Diabetes mellitus with renal manifestation ([place]) 06/18/2015  . Abnormal serum level of alkaline phosphatase 06/18/2015  . Bilateral low back pain with left-sided sciatica 06/18/2015  . Obesity (BMI 30.0-34.9) 06/18/2015  . Degenerative arthritis of hip 06/18/2015  . Sickle cell trait ([place]) 06/18/2015  . Dyslipidemia 05/27/2010  . Gastro-esophageal reflux disease without esophagitis 05/25/2008    Past Surgical History:  Procedure Laterality Date  . CHOLECYSTECTOMY    . COLONOSCOPY    . ESOPHAGOGASTRODUODENOSCOPY (EGD) WITH PROPOFOL N/A 10/05/2016   Procedure: ESOPHAGOGASTRODUODENOSCOPY (EGD) WITH PROPOFOL;  Surgeon: Lucilla Lame, MD;  Location: [place];  Service: Endoscopy;  Laterality: N/A;  . FRACTURE SURGERY Left    cast and pins   . SHOULDER ARTHROSCOPY WITH ROTATOR CUFF REPAIR AND SUBACROMIAL DECOMPRESSION Left 12/07/2018   Procedure: left shoulder manipulation under anesthesia, left shoulder arthroscopic lysis of adhesions;  Surgeon: Lovell Sheehan, MD;  Location: [facility];  Service: Orthopedics;  Laterality: Left;  . SHOULDER CLOSED REDUCTION Left 12/07/2018  Procedure: CLOSED MANIPULATION SHOULDER;  Surgeon: Lovell Sheehan, MD;  Location: [facility];  Service: Orthopedics;  Laterality: Left;  . shoulder surgery  Left 06/10/2018   Dr. Marica Otter  . TUBAL LIGATION      Prior to Admission medications   Medication Sig Start Date End Date Taking? Authorizing Provider  aspirin (ASPIRIN 81) 81 MG chewable tablet Chew 1 tablet (81 mg total) by mouth daily. 05/03/18   Steele Sizer, MD    atorvastatin (LIPITOR) 20 MG tablet Take 1 tablet (20 mg total) by mouth daily. 08/11/19   Steele Sizer, MD  Blood Glucose Monitoring Suppl (CONTOUR NEXT ONE) KIT USE TO TEST BLOOD SUGAR ONCE D 04/21/19   [provider]  Butalbital-APAP-Caffeine 50-300-40 MG CAPS Take 1 capsule by mouth every 4 (four) hours as needed. 04/18/19   Steele Sizer, MD  Cholecalciferol (VITAMIN D) 2000 units CAPS Take 1 capsule (2,000 Units total) by mouth daily. 04/20/17   Steele Sizer, MD  cyclobenzaprine (FLEXERIL) 5 MG tablet TAKE 1 TO 2 TABLETS BY MOUTH TWICE DAILY... TAKE 1 IN THE MORNING. AND TAKE 2 TABLETS IN THE AFTERNOON 05/19/19   Steele Sizer, MD  gabapentin (NEURONTIN) 300 MG capsule Take 1 capsule (300 mg total) by mouth 2 (two) times daily. 08/11/19   Steele Sizer, MD  glucose blood test strip Use as instructed 04/21/19   Steele Sizer, MD  HYDROcodone-acetaminophen (NORCO/VICODIN) 5-325 MG tablet TK 1 T PO Q 6 TO 8 H PRN 06/20/19   [provider]  magnesium oxide (MAGNESIUM-OXIDE) 400 (241.3 Mg) MG tablet Take 1 tablet (400 mg total) by mouth 2 (two) times daily. 04/18/19   Steele Sizer, MD  meloxicam (MOBIC) 15 MG tablet TK 1 T PO QD 03/27/19   Lovell Sheehan, MD  Microlet Lancets MISC USE TO CHECK BLOOD SUGAR ONCE D 04/21/19   [provider]  omeprazole (PRILOSEC) 20 MG capsule Take 1 capsule (20 mg total) by mouth daily. 08/11/19   Steele Sizer, MD  ondansetron (ZOFRAN ODT) 4 MG disintegrating tablet Take 1 tablet (4 mg total) by mouth every 8 (eight) hours as needed for nausea or vomiting. 11/21/19   Vanessa Naper, MD  promethazine (PHENERGAN) 12.5 MG tablet Take 1 tablet (12.5 mg total) by mouth every 6 (six) hours as needed for nausea or vomiting. 10/11/19   Steele Sizer, MD  RA COL-RITE 100 MG capsule Take 100 mg by mouth 2 (two) times daily as needed for mild constipation.  06/14/18   [provider]  Scar Treatment Products ([place] SPF 30) CREA  APPLY 1 GRAM BID PRN. 06/09/19   [provider]  Semaglutide,0.25 or 0.5MG/DOS, (OZEMPIC, 0.25 OR 0.5 MG/DOSE,) 2 MG/1.5ML SOPN Inject 0.5 mg into the muscle every Monday. 08/14/19   Steele Sizer, MD  temazepam (RESTORIL) 15 MG capsule Take 1 capsule (15 mg total) by mouth as needed. 01/09/19   Steele Sizer, MD  lisinopril (ZESTRIL) 10 MG tablet Take 1 tablet (10 mg total) by mouth daily. 08/11/19 01/29/20  Steele Sizer, MD    Allergies Ace inhibitors  Family History  Problem Relation Age of Onset  . Migraines Mother   . Diabetes Mother   . Cancer Mother        lung  . Arthritis Brother   . Breast cancer Maternal Aunt   . Cancer Maternal Uncle        Lung and Colon  . Cirrhosis Brother   . Breast cancer Cousin  Social History Social History   Tobacco Use  . Smoking status: Never Smoker  . Smokeless tobacco: Never Used  Substance Use Topics  . Alcohol use: Yes    Alcohol/week: 0.0 standard drinks    Comment: rare  . Drug use: Yes    Types: Marijuana    Comment: smokes marijuana occasionally    Review of Systems Constitutional: No fever/chills Eyes: No visual changes. ENT: No sore throat.  See HPI Cardiovascular: Denies chest pain. Respiratory: Denies shortness of breath. Gastrointestinal: No abdominal pain.   Skin: Negative for rash. Neurological: Negative for headaches, areas of focal weakness or numbness.    ____________________________________________   PHYSICAL EXAM:  VITAL SIGNS: ED Triage Vitals  Enc Vitals Group     BP 01/29/20 1621 (!) 177/79     Pulse Rate 01/29/20 1621 70     Resp 01/29/20 1621 16     Temp 01/29/20 1621 98.6 F (37 C)     Temp Source 01/29/20 1621 Oral     SpO2 01/29/20 1621 99 %     Weight 01/29/20 1623 138 lb (62.6 kg)     Height 01/29/20 1623 5' 3"  (1.6 m)     Head Circumference --      Peak Flow --      Pain Score 01/29/20 1622 0     Pain Loc --      Pain Edu? --      Excl. in [place]? --      Constitutional: Alert and oriented. Well appearing and in no acute distress. Eyes: Conjunctivae are normal. Head: Atraumatic. Nose: No congestion/rhinnorhea. Mouth/Throat: Mucous membranes are moist.  Patient has moderate angioedema involving the upper lip, slightly more over the left.  Mild angioedema involving the lower lip, slightly worse over the left.  The oropharynx is widely patent.  She is having no respiratory distress. Neck: No stridor.  There is just slight fullness noted of the left anterior neck just lateral to the trachea without any shift or deviation.  Very mild. Cardiovascular: Normal rate, regular rhythm. Grossly normal heart sounds.  Good peripheral circulation. Respiratory: Normal respiratory effort.  No retractions. Lungs CTAB. Gastrointestinal: Soft and nontender. No distention. Musculoskeletal: No lower extremity tenderness nor edema. Neurologic:  Normal speech and language. No gross focal neurologic deficits are appreciated.  Skin:  Skin is warm, dry and intact. No rash noted. Psychiatric: Mood and affect are normal. Speech and behavior are normal.  ____________________________________________   LABS (all labs ordered are listed, but only abnormal results are displayed)  Labs Reviewed - No data to display ____________________________________________  EKG   ____________________________________________  RADIOLOGY   ____________________________________________   PROCEDURES  Procedure(s) performed: None  Procedures  Critical Care performed: No  ____________________________________________   INITIAL IMPRESSION / ASSESSMENT AND PLAN / ED COURSE  Pertinent labs & imaging results that were available during my care of the patient were reviewed by me and considered in my medical decision making (see chart for details).   Patient was for evaluation, she does not have symptoms consistent with an acute allergic reaction.  She has no wheezing no  abdominal symptoms, no itching hives or rash except she has angioedema involving a slight amount of the left anterior neck as well as left upper lip and left lower lip.  She reports the lip swelling has slightly improved since yesterday but the neck swelling has come up today.  She has no evidence of acute airway compromise, but I am concerned this represents a  reaction to likely ACE inhibitor.  I will observe her closely.  We will provide steroid, but it does appear unlikely that this will change her course given she did not respond to Benadryl or epinephrine and given her history I suspect this is ACE inhibitor induced angioedema.    Clinical Course as of Jan 28 1854  Mon Jan 29, 2020  1749 Patient alert, no distress.  Perhaps just a little sleepy from the Benadryl.  On my evaluation though she continues to have angioedema of her upper lip, it may be just slightly worse than before.  Given the involvement of the face, lips, and also some involvement of her left anterior neck discussed with the patient will admit her for careful airway observation for concerns of ACE inhibitor induced angioedema   [MQ]    Clinical Course User Index [MQ] Delman Kitten, MD    Patient reevaluated, she does seem to have a slight increase in angioedema of her upper lip.  No airway obstruction, and her airway is still widely patent.  Because she continues to have angioedema, slight increase during her observation window here in the ER, I do not feel that she can safely be discharged.  She will be admitted for observation and further treatment, discussed with Dr. Teanna Hedges of hospitalist service ____________________________________________   FINAL CLINICAL IMPRESSION(S) / ED DIAGNOSES  Final diagnoses:  Angioedema, initial encounter        Note:  This document was prepared using Dragon voice recognition software and may include unintentional dictation errors       Delman Kitten, MD 01/29/20 [phone]

## 2020-01-29 NOTE — ED Notes (Signed)
Report called to alyssa rn floor nurse 

## 2020-01-29 NOTE — ED Notes (Signed)
Dr Debby Bud in with pt now for admission.

## 2020-01-30 LAB — HEMOGLOBIN A1C
Hgb A1c MFr Bld: 5.8 % — ABNORMAL HIGH (ref 4.8–5.6)
Mean Plasma Glucose: 119.76 mg/dL

## 2020-01-30 LAB — MAGNESIUM: Magnesium: 1.7 mg/dL (ref 1.7–2.4)

## 2020-01-30 LAB — BASIC METABOLIC PANEL
Anion gap: 9 (ref 5–15)
BUN: 12 mg/dL (ref 6–20)
CO2: 26 mmol/L (ref 22–32)
Calcium: 9.6 mg/dL (ref 8.9–10.3)
Chloride: 104 mmol/L (ref 98–111)
Creatinine, Ser: 1.22 mg/dL — ABNORMAL HIGH (ref 0.44–1.00)
GFR calc Af Amer: 56 mL/min — ABNORMAL LOW (ref >60)
GFR calc non Af Amer: 48 mL/min — ABNORMAL LOW (ref >60)
Glucose, Bld: 127 mg/dL — ABNORMAL HIGH (ref 70–99)
Potassium: 4.2 mmol/L (ref 3.5–5.1)
Sodium: 139 mmol/L (ref 135–145)

## 2020-01-30 LAB — GLUCOSE, CAPILLARY
Glucose-Capillary: 118 mg/dL — ABNORMAL HIGH (ref 70–99)
Glucose-Capillary: 136 mg/dL — ABNORMAL HIGH (ref 70–99)

## 2020-01-30 LAB — SARS CORONAVIRUS 2 (TAT 6-24 HRS): SARS Coronavirus 2: NEGATIVE

## 2020-01-30 MED ORDER — LORATADINE 10 MG PO TABS: 10.0000 mg | ORAL_TABLET | Freq: Two times a day (BID) | ORAL | 0 refills | Status: DC

## 2020-01-30 MED ORDER — PREDNISONE 20 MG PO TABS
20.0000 mg | ORAL_TABLET | Freq: Once | ORAL | Status: AC
  Administered 2020-01-30: 20 mg via ORAL
  Filled 2020-01-30: qty 1

## 2020-01-30 MED ORDER — MAGNESIUM OXIDE 400 (241.3 MG) MG PO TABS
400.0000 mg | ORAL_TABLET | Freq: Two times a day (BID) | ORAL | Status: DC
  Administered 2020-01-30: 10:00:00 400 mg via ORAL
  Filled 2020-01-30: qty 1

## 2020-01-30 MED ORDER — DILTIAZEM HCL ER COATED BEADS 180 MG PO CP24: 180.0000 mg | ORAL_CAPSULE | Freq: Every day | ORAL | 0 refills | Status: DC

## 2020-01-30 MED ORDER — DIPHENHYDRAMINE HCL 25 MG PO TABS: 25.0000 mg | ORAL_TABLET | Freq: Four times a day (QID) | ORAL | 0 refills | Status: DC | PRN

## 2020-01-30 MED ORDER — PREDNISONE 20 MG PO TABS: 20.0000 mg | ORAL_TABLET | Freq: Every day | ORAL | 0 refills | Status: AC

## 2020-01-30 NOTE — Plan of Care (Signed)
No respiratory distress.  Lips with less swelling.

## 2020-01-30 NOTE — Discharge Summary (Signed)
Physician Discharge Summary  Nelma D Schreifels MRN:[id number] DOB: 09/17/1961 DOA: 01/29/2020  PCP: [facility]  Admit date: 01/29/2020 Discharge date: 01/30/2020  Admitted From: Home Disposition: Home  Recommendations for Outpatient Follow-up:  1. Follow up with PCP in 1-2 weeks 2. Discontinue lisinopril and discard any you have at home 3. Discuss medication changes with your primary care physician  Home Health: No Equipment/Devices: None Discharge Condition: Stable CODE STATUS: Full Diet recommendation: Heart Healthy / Carb Modified  Brief/Interim Summary: HPI: Brandy Johnston is a 59 y.o. female with medical history significant of hypertension sickle cell trait  Patient reports that yesterday she began to notice that she is having swelling of her upper and lower lips. No other symptoms. No trouble breathing. No fevers or chills. No hives or itching.  She went to the clinic today to have this evaluated, she was given Benadryl by EMS as well as received an epinephrine shot as she is having lip swelling  She reports these do not seem to have helped, but since yesterday her swelling has actually gone down just slightly. She does take lisinopril and has been on this for quite some time as well.  Denies swelling or any other symptoms elsewhere. No wheezing no difficulty breathing. No trouble swallowing.    Discharge Diagnoses:  Active Problems:   Benign essential HTN   Type 2 diabetes mellitus with stage 3 chronic kidney disease, without long-term current use of insulin (HCC)   Angioedema due to angiotensin converting enzyme inhibitor (ACE-I)  Angioedema most likely related to ACE. No swallow or respiratory distress Received 3 doses of p.o. prednisone in house Lip swelling improved No compromised airway Stable for discharge home Will provide short burst prednisone as well as Benadryl and loratadine Patient may benefit from transition to an  angiotensin receptor blocker however given the acute onset of angioedema will defer this decision to her outside treating providers  HTN stopped ACE Replace ACE with CCB - diltiazem CD 180 mg daily Defer consideration for angiotensin receptor blocker to outside treating providers  DM Weekly injection per record Can resume home regimen on discharge  Discharge Instructions  Discharge Instructions    Diet - low sodium heart healthy   Complete by: As directed    Discharge instructions   Complete by: As directed    Stop taking lisinopril indefinitely.  Please see your primary care physician and discuss medication change.  Lisinopril has been substituted for diltiazem which is in a different medication class.  Please discuss this change with your primary care physician.  I have prescribed you prednisone, Benadryl, Claritin for additional 5days to ensure complete resolution of your lip and face edema.   Increase activity slowly   Complete by: As directed      Allergies as of 01/30/2020      Reactions   Ace Inhibitors Swelling   Angioedema       Medication List    STOP taking these medications   lisinopril 10 MG tablet Commonly known as: ZESTRIL     TAKE these medications   aspirin 81 MG chewable tablet Commonly known as: Aspirin 81 Chew 1 tablet (81 mg total) by mouth daily.   atorvastatin 20 MG tablet Commonly known as: LIPITOR Take 1 tablet (20 mg total) by mouth daily.   Butalbital-APAP-Caffeine 50-300-40 MG Caps Take 1 capsule by mouth every 4 (four) hours as needed.   Contour Next One Kit USE TO TEST BLOOD SUGAR ONCE D     cyclobenzaprine 5 MG tablet Commonly known as: FLEXERIL TAKE 1 TO 2 TABLETS BY MOUTH TWICE DAILY... TAKE 1 IN THE MORNING. AND TAKE 2 TABLETS IN THE AFTERNOON   diltiazem 180 MG 24 hr capsule Commonly known as: CARDIZEM CD Take 1 capsule (180 mg total) by mouth daily. Start taking on: January 31, 2020   diphenhydrAMINE 25 MG  tablet Commonly known as: BENADRYL Take 1 tablet (25 mg total) by mouth every 6 (six) hours as needed for up to 5 days for itching or allergies.   gabapentin 300 MG capsule Commonly known as: NEURONTIN Take 1 capsule (300 mg total) by mouth 2 (two) times daily.   glucose blood test strip Use as instructed   HYDROcodone-acetaminophen 5-325 MG tablet Commonly known as: NORCO/VICODIN TK 1 T PO Q 6 TO 8 H PRN   loratadine 10 MG tablet Commonly known as: CLARITIN Take 1 tablet (10 mg total) by mouth 2 (two) times daily for 5 days.   magnesium oxide 400 (241.3 Mg) MG tablet Commonly known as: MAGnesium-Oxide Take 1 tablet (400 mg total) by mouth 2 (two) times daily.   Mederma SPF 30 Crea APPLY 1 GRAM BID PRN.   meloxicam 15 MG tablet Commonly known as: MOBIC TK 1 T PO QD   Microlet Lancets Misc USE TO CHECK BLOOD SUGAR ONCE D   omeprazole 20 MG capsule Commonly known as: PRILOSEC Take 1 capsule (20 mg total) by mouth daily.   ondansetron 4 MG disintegrating tablet Commonly known as: Zofran ODT Take 1 tablet (4 mg total) by mouth every 8 (eight) hours as needed for nausea or vomiting.   Ozempic (0.25 or 0.5 MG/DOSE) 2 MG/1.5ML Sopn Generic drug: Semaglutide(0.25 or 0.5MG/DOS) Inject 0.5 mg into the muscle every Monday.   predniSONE 20 MG tablet Commonly known as: Deltasone Take 1 tablet (20 mg total) by mouth daily for 5 days. Start taking on: January 31, 2020   promethazine 12.5 MG tablet Commonly known as: PHENERGAN Take 1 tablet (12.5 mg total) by mouth every 6 (six) hours as needed for nausea or vomiting.   RA Col-Rite 100 MG capsule Generic drug: docusate sodium Take 100 mg by mouth 2 (two) times daily as needed for mild constipation.   temazepam 15 MG capsule Commonly known as: RESTORIL Take 1 capsule (15 mg total) by mouth as needed.   Vitamin D 50 MCG (2000 UT) Caps Take 1 capsule (2,000 Units total) by mouth daily.      Follow-up [place]. Schedule an appointment as soon as possible for a visit in 1 week(s).   Specialty: General Practice Contact information: [place] [address] [phone]          Allergies  Allergen Reactions  . Ace Inhibitors Swelling    Angioedema     Consultations:  None   Procedures/Studies:  No results found. (Echo, Carotid, EGD, Colonoscopy, ERCP)    Subjective: Seen and examined on the day of discharge Lip swelling improved Breathing clear No complaints  Discharge Exam: Vitals:   01/30/20 0742 01/30/20 1144  BP: 106/67 117/65  Pulse: (!) 59 71  Resp: 17 18  Temp: 97.8 F (36.6 C) (!) 97.5 F (36.4 C)  SpO2: 100% 100%   Vitals:   01/29/20 2333 01/30/20 0630 01/30/20 0742 01/30/20 1144  BP: 116/67 111/70 106/67 117/65  Pulse: 72 (!) 58 (!) 59 71  Resp: _0 Temp: 98.4  F (36.9 C) 98.6 F (37 C) 97.8 F (36.6 C) (!) 97.5 F (36.4 C)  TempSrc: Oral Oral Oral Oral  SpO2: 100% 98% 100% 100%  Weight:  63.6 kg    Height: 5' 3" (1.6 m)       General: Pt is alert, awake, not in acute distress Cardiovascular: RRR, S1/S2 +, no rubs, no gallops Respiratory: CTA bilaterally, no wheezing, no rhonchi Abdominal: Soft, NT, ND, bowel sounds + Extremities: no edema, no cyanosis    The results of significant diagnostics from this hospitalization (including imaging, microbiology, ancillary and laboratory) are listed below for reference.     Microbiology: No results found for this or any previous visit (from the past 240 hour(s)).   Labs: BNP (last 3 results) No results for input(s): BNP in the last 8760 hours. Basic Metabolic Panel: Recent Labs  Lab 01/29/20 2340  NA 139  K 4.2  CL 104  CO2 26  GLUCOSE 127*  BUN 12  CREATININE 1.22*  CALCIUM 9.6  MG 1.7   Liver Function Tests: No results for input(s): AST, ALT, ALKPHOS, BILITOT, PROT, ALBUMIN in the last 168 hours. No results  for input(s): LIPASE, AMYLASE in the last 168 hours. No results for input(s): AMMONIA in the last 168 hours. CBC: Recent Labs  Lab 01/29/20 2340  WBC 8.8  HGB 12.6  HCT 38.5  MCV 81.2  PLT 242   Cardiac Enzymes: No results for input(s): CKTOTAL, CKMB, CKMBINDEX, TROPONINI in the last 168 hours. BNP: Invalid input(s): POCBNP CBG: Recent Labs  Lab 01/29/20 2327 01/30/20 0743 01/30/20 1208  GLUCAP 96 118* 136*   D-Dimer No results for input(s): DDIMER in the last 72 hours. Hgb A1c Recent Labs    01/29/20 2340  HGBA1C 5.8*   Lipid Profile No results for input(s): CHOL, HDL, LDLCALC, TRIG, CHOLHDL, LDLDIRECT in the last 72 hours. Thyroid function studies No results for input(s): TSH, T4TOTAL, T3FREE, THYROIDAB in the last 72 hours.  Invalid input(s): FREET3 Anemia work up No results for input(s): VITAMINB12, FOLATE, FERRITIN, TIBC, IRON, RETICCTPCT in the last 72 hours. Urinalysis    Component Value Date/Time   COLORURINE YELLOW (A) 11/21/2019 1535   APPEARANCEUR CLEAR (A) 11/21/2019 1535   APPEARANCEUR Clear 07/08/2012 0043   LABSPEC 1.013 11/21/2019 1535   LABSPEC 1.020 07/08/2012 0043   PHURINE 6.0 11/21/2019 1535   GLUCOSEU NEGATIVE 11/21/2019 1535   GLUCOSEU Negative 07/08/2012 0043   HGBUR NEGATIVE 11/21/2019 1535   BILIRUBINUR NEGATIVE 11/21/2019 1535   BILIRUBINUR Negative 07/08/2012 0043   KETONESUR NEGATIVE 11/21/2019 1535   PROTEINUR 100 (A) 11/21/2019 1535   NITRITE NEGATIVE 11/21/2019 1535   LEUKOCYTESUR SMALL (A) 11/21/2019 1535   LEUKOCYTESUR Negative 07/08/2012 0043   Sepsis Labs Invalid input(s): PROCALCITONIN,  WBC,  LACTICIDVEN Microbiology No results found for this or any previous visit (from the past 240 hour(s)).   Time coordinating discharge: Over 30 minutes  SIGNED:   Sidney Ace, MD  [facility] 01/30/2020, 12:58 PM Pager   If 7PM-7AM, please contact night-coverage

## 2020-02-01 LAB — HIV ANTIBODY (ROUTINE TESTING W REFLEX): HIV Screen 4th Generation wRfx: NONREACTIVE

## 2020-02-16 ENCOUNTER — Ambulatory Visit: Payer: Medicaid Other | Admitting: Family Medicine

## 2020-04-17 LAB — HM COLONOSCOPY

## 2020-07-03 DIAGNOSIS — M75112 Incomplete rotator cuff tear or rupture of left shoulder, not specified as traumatic: Secondary | ICD-10-CM | POA: Insufficient documentation

## 2020-11-12 NOTE — Progress Notes (Signed)
Name: Brandy Johnston   MRN: [id number]    DOB: 1961/08/12   Date:11/13/2020       Progress Note  Subjective  Chief Complaint  Follow up  HPI   DMII with renal manifestation, and neuropathy. She continues to have numbness and tingling onfinger tips and toes are better since she has been taking magnesium and gabapentin. She checks glucose occasionally and it was been in the upper 70's to 110 . Denies polyphagia, polydipsia or polyuria. She is is on  Ozempic 0.25 mg weekly.  . Last hgbA1C is down to 5.7 % , she does not want to stop medication, she states medication curbs her appetite, explained glucose in the 70's is low. We will check urine micro today   Insomnia: She is on disability now but still not sleeping, discussed sleep hygiene and we will try trazodone , she may also take melatonin otc   HTN: She is no longer taking ACE/ARB because she had angioedema, taking Cardizem and bp is at goal, denies chest pain or palpitation   Hyperlipidemia:she has not  been compliant with statin therapy lately.  We will recheck labs today   GERD: still on Omeprazole, EGD is normal, she is still taking Ozempic, explained that Ozempic can cause weight loss and bloating. She states feeling better now, no heart burn or indigestion, dose not want to stop Ozempic at this time  Left shoulder surgery : work related injury in January 2019, had three shoulder surgeries since, and on disability since Nov 2021. She had to go to [facility] to get medical care when out of insurance but is back here now.   Low calcium and magnesium: she was seen by Endocrinologist but lost to follow up, we will recheck levels today   Malnutrition: she lost 20 lbs since last year, she had mammogram, EGD, colonoscopy done at [facility], negative breast biopsy, she states weight has been stable, discussed increase protein intake. We will monitor   Migraine/Tension headaches: she has episodes seldom, lately it has been  radiating from nuchal area to her ears, pain can be aching or  sharp, associated with photophobia, but no phonophobia. No nausea or vomiting. She takes prn Fioricet prn, sometimes just takes a nap and symptoms resolves   Atherosclerosis Aorta: taking aspirin and statins , we will recheck lipid panel today   Patient Active Problem List   Diagnosis Date Noted  . Incomplete tear of left rotator cuff 07/03/2020  . Angioedema due to angiotensin converting enzyme inhibitor (ACE-I) 01/29/2020  . Chronic bilateral back pain 07/26/2017  . Bilateral shoulder pain 07/26/2017  . Coronary artery calcification 05/17/2017  . Heart palpitations 05/15/2017  . History of acute gastritis   . Leukocytosis 09/30/2016  . Atherosclerosis of abdominal aorta ([place]) 08/18/2016  . Type 2 diabetes mellitus with stage 3 chronic kidney disease, without long-term current use of insulin ([place]) 12/16/2015  . Diabetic neuropathy associated with type 2 diabetes mellitus ([place]) 09/18/2015  . Insomnia 09/18/2015  . Benign essential HTN 06/18/2015  . Chronic kidney disease (CKD), stage III (moderate) ([place]) 06/18/2015  . Diabetes mellitus with renal manifestation ([place]) 06/18/2015  . Abnormal serum level of alkaline phosphatase 06/18/2015  . Bilateral low back pain with left-sided sciatica 06/18/2015  . Obesity (BMI 30.0-34.9) 06/18/2015  . Degenerative arthritis of hip 06/18/2015  . Sickle cell trait ([place]) 06/18/2015  . Dyslipidemia 05/27/2010  . Gastro-esophageal reflux disease without esophagitis 05/25/2008    Past Surgical History:  Procedure Laterality  Date  . BREAST BIOPSY Right 12/27/2020   negative - done at [facility]   . CHOLECYSTECTOMY    . COLONOSCOPY    . ESOPHAGOGASTRODUODENOSCOPY (EGD) WITH PROPOFOL N/A 10/05/2016   Procedure: ESOPHAGOGASTRODUODENOSCOPY (EGD) WITH PROPOFOL;  Surgeon: Lucilla Lame, MD;  Location: [place];  Service: Endoscopy;  Laterality: N/A;  . FRACTURE SURGERY Left    cast and pins    . SHOULDER ARTHROSCOPY WITH ROTATOR CUFF REPAIR AND SUBACROMIAL DECOMPRESSION Left 12/07/2018   Procedure: left shoulder manipulation under anesthesia, left shoulder arthroscopic lysis of adhesions;  Surgeon: Lovell Sheehan, MD;  Location: [facility];  Service: Orthopedics;  Laterality: Left;  . SHOULDER CLOSED REDUCTION Left 12/07/2018   Procedure: CLOSED MANIPULATION SHOULDER;  Surgeon: Lovell Sheehan, MD;  Location: [facility];  Service: Orthopedics;  Laterality: Left;  . shoulder surgery  Left 06/10/2018   Dr. Marica Otter  . TUBAL LIGATION      Family History  Problem Relation Age of Onset  . Migraines Mother   . Diabetes Mother   . Cancer Mother        lung  . Arthritis Brother   . Breast cancer Maternal Aunt   . Cancer Maternal Uncle        Lung and Colon  . Cirrhosis Brother   . Breast cancer Cousin     Social History   Tobacco Use  . Smoking status: Never Smoker  . Smokeless tobacco: Never Used  Substance Use Topics  . Alcohol use: Yes    Alcohol/week: 0.0 standard drinks    Comment: rare     Current Outpatient Medications:  .  aspirin (ASPIRIN 81) 81 MG chewable tablet, Chew 1 tablet (81 mg total) by mouth daily., Disp: 30 tablet, Rfl: 0 .  atorvastatin (LIPITOR) 20 MG tablet, Take 1 tablet (20 mg total) by mouth daily., Disp: 90 tablet, Rfl: 1 .  Blood Glucose Monitoring Suppl (CONTOUR NEXT ONE) KIT, USE TO TEST BLOOD SUGAR ONCE D, Disp: , Rfl:  .  Butalbital-APAP-Caffeine 50-300-40 MG CAPS, Take 1 capsule by mouth every 4 (four) hours as needed., Disp: 40 capsule, Rfl: 0 .  Cholecalciferol (VITAMIN D) 2000 units CAPS, Take 1 capsule (2,000 Units total) by mouth daily., Disp: 30 capsule, Rfl: 0 .  cyclobenzaprine (FLEXERIL) 5 MG tablet, TAKE 1 TO 2 TABLETS BY MOUTH TWICE DAILY... TAKE 1 IN THE MORNING. AND TAKE 2 TABLETS IN THE AFTERNOON, Disp: 90 tablet, Rfl: 2 .  ergocalciferol (VITAMIN D2) 1.25 MG (50000 UT) capsule, Take by mouth., Disp: , Rfl:  .  gabapentin  (NEURONTIN) 300 MG capsule, Take 1 capsule (300 mg total) by mouth 2 (two) times daily., Disp: 180 capsule, Rfl: 1 .  glucose blood test strip, Use as instructed, Disp: 100 each, Rfl: 12 .  magnesium oxide (MAGNESIUM-OXIDE) 400 (241.3 Mg) MG tablet, Take 1 tablet (400 mg total) by mouth 2 (two) times daily., Disp: 60 tablet, Rfl: 5 .  Microlet Lancets MISC, USE TO CHECK BLOOD SUGAR ONCE D, Disp: , Rfl:  .  omeprazole (PRILOSEC) 20 MG capsule, Take 1 capsule (20 mg total) by mouth daily., Disp: 90 capsule, Rfl: 1 .  promethazine (PHENERGAN) 12.5 MG tablet, Take 1 tablet (12.5 mg total) by mouth every 6 (six) hours as needed for nausea or vomiting., Disp: 12 tablet, Rfl: 0 .  Semaglutide,0.25 or 0.5MG/DOS, (OZEMPIC, 0.25 OR 0.5 MG/DOSE,) 2 MG/1.5ML SOPN, Inject 0.5 mg into the muscle every Monday., Disp: 9 mL, Rfl: 1 .  traMADol (  ULTRAM) 50 MG tablet, 1-2 tablets every 8 hours as needed for pain, Disp: , Rfl:  .  diltiazem (CARDIZEM CD) 180 MG 24 hr capsule, Take 1 capsule (180 mg total) by mouth daily., Disp: 30 capsule, Rfl: 0 .  traZODone (DESYREL) 50 MG tablet, Take 0.5-1 tablets (25-50 mg total) by mouth at bedtime as needed for sleep., Disp: 30 tablet, Rfl: 2  Allergies  Allergen Reactions  . Ace Inhibitors Swelling    Angioedema   . Lisinopril Swelling    Face and neck swelling    I personally reviewed active problem list, medication list, allergies, family history, social history, health maintenance with the patient/caregiver today.   ROS  Constitutional: Negative for fever, positive for  weight change.  Respiratory: Negative for cough and shortness of breath.   Cardiovascular: Negative for chest pain or palpitations.  Gastrointestinal: Negative for abdominal pain, no bowel changes.  Musculoskeletal: Negative for gait problem or joint swelling.  Skin: Negative for rash.  Neurological: Negative for dizziness or headache.  No other specific complaints in a complete review of  systems (except as listed in HPI above).  Objective  Vitals:   11/13/20 0858  BP: 110/72  Pulse: 68  Resp: 18  Temp: 98.3 F (36.8 C)  TempSrc: Oral  SpO2: 99%  Weight: 143 lb 6.4 oz (65 kg)  Height: _0  (1.6 m)    Body mass index is 25.4 kg/m.  Physical Exam  Constitutional: Patient appears well-developed and very think /temporal waisting.No distress.  HEENT: head atraumatic, normocephalic, pupils equal and reactive to light,neck supple, throat within normal limits Cardiovascular: Normal rate, regular rhythm and normal heart sounds.  No murmur heard. No BLE edema. Pulmonary/Chest: Effort normal and breath sounds normal. No respiratory distress. Abdominal: Soft.  There is no tenderness. Psychiatric: Patient has a normal mood and affect. behavior is normal. Judgment and thought content normal.  Recent Results (from the past 2160 hour(s))  POCT HgB A1C     Status: Abnormal   Collection Time: 11/13/20  8:57 AM  Result Value Ref Range   Hemoglobin A1C 5.7 (A) 4.0 - 5.6 %   HbA1c POC (<> result, manual entry)     HbA1c, POC (prediabetic range)     HbA1c, POC (controlled diabetic range)      Diabetic Foot Exam: Diabetic Foot Exam - Simple   Simple Foot Form Diabetic Foot exam was performed with the following findings: Yes 11/13/2020  9:42 AM  Visual Inspection No deformities, no ulcerations, no other skin breakdown bilaterally: Yes Sensation Testing Intact to touch and monofilament testing bilaterally: Yes Pulse Check Posterior Tibialis and Dorsalis pulse intact bilaterally: Yes Comments      PHQ2/9: Depression screen [facility] 2/9 11/13/2020 08/11/2019 04/18/2019 01/09/2019 09/28/2018  Decreased Interest 0 0 0 1 0  Down, Depressed, Hopeless 0 0 0 0 0  PHQ - 2 Score 0 0 0 1 0  Altered sleeping 1 0 0 2 0  Tired, decreased energy 0 0 0 1 0  Change in appetite 1 0 0 0 0  Feeling bad or failure about yourself  0 0 0 0 0  Trouble concentrating 0 0 0 0 0  Moving slowly or  fidgety/restless 0 0 0 0 0  Suicidal thoughts 0 0 0 0 0  PHQ-9 Score 2 0 0 4 0  Difficult doing work/chores Not difficult at all - - Not difficult at all Not difficult at all  Some recent data might be hidden  phq 9 is negative   Fall Risk: Fall Risk  11/13/2020 08/11/2019 04/18/2019 01/09/2019 09/28/2018  Falls in the past year? 1 0 0 1 Yes  Comment - - - - -  Number falls in past yr: 0 0 0 0 1  Injury with Fall? 1 0 0 1 Yes  Comment - - - At work Jan. 16 2019 -  Follow up - - - - -     Functional Status Survey: Is the patient deaf or have difficulty hearing?: No Does the patient have difficulty seeing, even when wearing glasses/contacts?: Yes Does the patient have difficulty concentrating, remembering, or making decisions?: No Does the patient have difficulty walking or climbing stairs?: No Does the patient have difficulty dressing or bathing?: No Does the patient have difficulty doing errands alone such as visiting a doctor's office or shopping?: No   Assessment & Plan  1. Type 2 diabetes mellitus with stage 3 chronic kidney disease, without long-term current use of insulin, unspecified whether stage 3a or 3b CKD (HCC)  - POCT HgB A1C - HM Diabetes Foot Exam - Microalbumin / creatinine urine ratio  2. Diabetic polyneuropathy associated with type 2 diabetes mellitus ([place])   3. Benign essential HTN  - COMPLETE METABOLIC PANEL WITH GFR - CBC with Differential/Platelet  4. Sickle cell trait ([place])   5. Chronic bilateral low back pain with left-sided sciatica   6. Atherosclerosis of abdominal aorta (HCC)  - Lipid panel  7. Gastro-esophageal reflux disease without esophagitis   8. Encounter for screening mammogram for malignant neoplasm of breast  - MM Digital Screening; Future  9. Migraine without aura and without status migrainosus, not intractable   10. History of shoulder surgery   11. Other insomnia  - traZODone (DESYREL) 50 MG tablet; Take 0.5-1  tablets (25-50 mg total) by mouth at bedtime as needed for sleep.  Dispense: 30 tablet; Refill: 2  12. Mild  protein-calorie malnutrition ([place])   13. Low magnesium level  - Magnesium

## 2020-11-13 ENCOUNTER — Other Ambulatory Visit: Payer: Self-pay

## 2020-11-13 ENCOUNTER — Encounter: Payer: Self-pay | Admitting: Family Medicine

## 2020-11-13 ENCOUNTER — Ambulatory Visit (INDEPENDENT_AMBULATORY_CARE_PROVIDER_SITE_OTHER): Payer: Medicare Other | Admitting: Family Medicine

## 2020-11-13 VITALS — BP 110/72 | HR 68 | Temp 98.3°F | Resp 18 | Ht 63.0 in | Wt 143.4 lb

## 2020-11-13 DIAGNOSIS — D573 Sickle-cell trait: Secondary | ICD-10-CM

## 2020-11-13 DIAGNOSIS — E1122 Type 2 diabetes mellitus with diabetic chronic kidney disease: Secondary | ICD-10-CM | POA: Diagnosis not present

## 2020-11-13 DIAGNOSIS — I10 Essential (primary) hypertension: Secondary | ICD-10-CM

## 2020-11-13 DIAGNOSIS — E1142 Type 2 diabetes mellitus with diabetic polyneuropathy: Secondary | ICD-10-CM | POA: Diagnosis not present

## 2020-11-13 DIAGNOSIS — I700 Atherosclerosis of aorta: Secondary | ICD-10-CM

## 2020-11-13 DIAGNOSIS — G8929 Other chronic pain: Secondary | ICD-10-CM

## 2020-11-13 DIAGNOSIS — Z1231 Encounter for screening mammogram for malignant neoplasm of breast: Secondary | ICD-10-CM

## 2020-11-13 DIAGNOSIS — R790 Abnormal level of blood mineral: Secondary | ICD-10-CM

## 2020-11-13 DIAGNOSIS — Z9889 Other specified postprocedural states: Secondary | ICD-10-CM

## 2020-11-13 DIAGNOSIS — E441 Mild protein-calorie malnutrition: Secondary | ICD-10-CM

## 2020-11-13 DIAGNOSIS — N183 Chronic kidney disease, stage 3 unspecified: Secondary | ICD-10-CM

## 2020-11-13 DIAGNOSIS — G43009 Migraine without aura, not intractable, without status migrainosus: Secondary | ICD-10-CM

## 2020-11-13 DIAGNOSIS — G4709 Other insomnia: Secondary | ICD-10-CM

## 2020-11-13 DIAGNOSIS — M5442 Lumbago with sciatica, left side: Secondary | ICD-10-CM

## 2020-11-13 DIAGNOSIS — K219 Gastro-esophageal reflux disease without esophagitis: Secondary | ICD-10-CM

## 2020-11-13 LAB — POCT GLYCOSYLATED HEMOGLOBIN (HGB A1C): Hemoglobin A1C: 5.7 % — AB (ref 4.0–5.6)

## 2020-11-13 MED ORDER — TRAZODONE HCL 50 MG PO TABS: 25.0000 mg | ORAL_TABLET | Freq: Every evening | ORAL | 2 refills | Status: DC | PRN

## 2020-11-14 LAB — CBC WITH DIFFERENTIAL/PLATELET
Absolute Monocytes: 734 cells/uL (ref 200–950)
Basophils Absolute: 105 cells/uL (ref 0–200)
Basophils Relative: 0.8 %
Eosinophils Absolute: 170 cells/uL (ref 15–500)
Eosinophils Relative: 1.3 %
HCT: 40.9 % (ref 35.0–45.0)
Hemoglobin: 13.4 g/dL (ref 11.7–15.5)
Lymphs Abs: 3406 cells/uL (ref 850–3900)
MCH: 26.7 pg — ABNORMAL LOW (ref 27.0–33.0)
MCHC: 32.8 g/dL (ref 32.0–36.0)
MCV: 81.5 fL (ref 80.0–100.0)
MPV: 10.8 fL (ref 7.5–12.5)
Monocytes Relative: 5.6 %
Neutro Abs: 8685 cells/uL — ABNORMAL HIGH (ref 1500–7800)
Neutrophils Relative %: 66.3 %
Platelets: 258 10*3/uL (ref 140–400)
RBC: 5.02 10*6/uL (ref 3.80–5.10)
RDW: 13.7 % (ref 11.0–15.0)
Total Lymphocyte: 26 %
WBC: 13.1 10*3/uL — ABNORMAL HIGH (ref 3.8–10.8)

## 2020-11-14 LAB — MICROALBUMIN / CREATININE URINE RATIO
Creatinine, Urine: 93 mg/dL (ref 20–275)
Microalb Creat Ratio: 8 mcg/mg creat (ref <30)
Microalb, Ur: 0.7 mg/dL

## 2020-11-14 LAB — COMPLETE METABOLIC PANEL WITH GFR
AG Ratio: 1.4 (calc) (ref 1.0–2.5)
ALT: 15 U/L (ref 6–29)
AST: 19 U/L (ref 10–35)
Albumin: 4.2 g/dL (ref 3.6–5.1)
Alkaline phosphatase (APISO): 122 U/L (ref 37–153)
BUN/Creatinine Ratio: 15 (calc) (ref 6–22)
BUN: 17 mg/dL (ref 7–25)
CO2: 29 mmol/L (ref 20–32)
Calcium: 9.8 mg/dL (ref 8.6–10.4)
Chloride: 102 mmol/L (ref 98–110)
Creat: 1.15 mg/dL — ABNORMAL HIGH (ref 0.50–0.99)
GFR, Est African American: 59 mL/min/{1.73_m2} — ABNORMAL LOW (ref >=60)
GFR, Est Non African American: 51 mL/min/{1.73_m2} — ABNORMAL LOW (ref >=60)
Globulin: 3.1 g/dL (calc) (ref 1.9–3.7)
Glucose, Bld: 86 mg/dL (ref 65–99)
Potassium: 5.2 mmol/L (ref 3.5–5.3)
Sodium: 141 mmol/L (ref 135–146)
Total Bilirubin: 0.4 mg/dL (ref 0.2–1.2)
Total Protein: 7.3 g/dL (ref 6.1–8.1)

## 2020-11-14 LAB — LIPID PANEL
Cholesterol: 216 mg/dL — ABNORMAL HIGH (ref <200)
HDL: 85 mg/dL (ref >=50)
LDL Cholesterol (Calc): 117 mg/dL (calc) — ABNORMAL HIGH
Non-HDL Cholesterol (Calc): 131 mg/dL (calc) — ABNORMAL HIGH (ref <130)
Total CHOL/HDL Ratio: 2.5 (calc) (ref <5.0)
Triglycerides: 45 mg/dL (ref <150)

## 2020-11-14 LAB — MAGNESIUM: Magnesium: 1.9 mg/dL (ref 1.5–2.5)

## 2020-11-15 ENCOUNTER — Ambulatory Visit: Payer: Medicaid Other | Admitting: Family Medicine

## 2020-12-26 ENCOUNTER — Encounter: Payer: Self-pay | Admitting: Emergency Medicine

## 2020-12-26 ENCOUNTER — Other Ambulatory Visit: Payer: Self-pay

## 2020-12-26 ENCOUNTER — Emergency Department
Admission: EM | Admit: 2020-12-26 | Discharge: 2020-12-27 | Disposition: A | Discharge status: Home / Self Care | Payer: Medicare Other | Attending: Emergency Medicine | Admitting: Emergency Medicine

## 2020-12-26 ENCOUNTER — Ambulatory Visit: Payer: Self-pay | Admitting: *Deleted

## 2020-12-26 ENCOUNTER — Emergency Department: Payer: Medicare Other

## 2020-12-26 DIAGNOSIS — R202 Paresthesia of skin: Secondary | ICD-10-CM

## 2020-12-26 DIAGNOSIS — R208 Other disturbances of skin sensation: Secondary | ICD-10-CM | POA: Diagnosis not present

## 2020-12-26 DIAGNOSIS — Z7982 Long term (current) use of aspirin: Secondary | ICD-10-CM | POA: Insufficient documentation

## 2020-12-26 DIAGNOSIS — E1122 Type 2 diabetes mellitus with diabetic chronic kidney disease: Secondary | ICD-10-CM | POA: Diagnosis not present

## 2020-12-26 DIAGNOSIS — E876 Hypokalemia: Secondary | ICD-10-CM | POA: Diagnosis not present

## 2020-12-26 DIAGNOSIS — N183 Chronic kidney disease, stage 3 unspecified: Secondary | ICD-10-CM | POA: Diagnosis not present

## 2020-12-26 DIAGNOSIS — Z79899 Other long term (current) drug therapy: Secondary | ICD-10-CM | POA: Diagnosis not present

## 2020-12-26 DIAGNOSIS — R200 Anesthesia of skin: Secondary | ICD-10-CM

## 2020-12-26 DIAGNOSIS — E1140 Type 2 diabetes mellitus with diabetic neuropathy, unspecified: Secondary | ICD-10-CM | POA: Insufficient documentation

## 2020-12-26 DIAGNOSIS — Z794 Long term (current) use of insulin: Secondary | ICD-10-CM | POA: Insufficient documentation

## 2020-12-26 DIAGNOSIS — I129 Hypertensive chronic kidney disease with stage 1 through stage 4 chronic kidney disease, or unspecified chronic kidney disease: Secondary | ICD-10-CM | POA: Insufficient documentation

## 2020-12-26 DIAGNOSIS — R531 Weakness: Secondary | ICD-10-CM

## 2020-12-26 LAB — DIFFERENTIAL
Abs Immature Granulocytes: 0.04 10*3/uL (ref 0.00–0.07)
Basophils Absolute: 0.1 10*3/uL (ref 0.0–0.1)
Basophils Relative: 1 %
Eosinophils Absolute: 0.2 10*3/uL (ref 0.0–0.5)
Eosinophils Relative: 2 %
Immature Granulocytes: 0 %
Lymphocytes Relative: 30 %
Lymphs Abs: 2.7 10*3/uL (ref 0.7–4.0)
Monocytes Absolute: 0.5 10*3/uL (ref 0.1–1.0)
Monocytes Relative: 6 %
Neutro Abs: 5.5 10*3/uL (ref 1.7–7.7)
Neutrophils Relative %: 61 %

## 2020-12-26 LAB — COMPREHENSIVE METABOLIC PANEL
ALT: 13 U/L (ref 0–44)
AST: 21 U/L (ref 15–41)
Albumin: 3.8 g/dL (ref 3.5–5.0)
Alkaline Phosphatase: 91 U/L (ref 38–126)
Anion gap: 13 (ref 5–15)
BUN: 10 mg/dL (ref 8–23)
CO2: 28 mmol/L (ref 22–32)
Calcium: 7.2 mg/dL — ABNORMAL LOW (ref 8.9–10.3)
Chloride: 101 mmol/L (ref 98–111)
Creatinine, Ser: 1.02 mg/dL — ABNORMAL HIGH (ref 0.44–1.00)
GFR, Estimated: >60 mL/min (ref >60)
Glucose, Bld: 87 mg/dL (ref 70–99)
Potassium: 2.7 mmol/L — CL (ref 3.5–5.1)
Sodium: 142 mmol/L (ref 135–145)
Total Bilirubin: 0.7 mg/dL (ref 0.3–1.2)
Total Protein: 7.4 g/dL (ref 6.5–8.1)

## 2020-12-26 LAB — CBC
HCT: 35.9 % — ABNORMAL LOW (ref 36.0–46.0)
Hemoglobin: 11.8 g/dL — ABNORMAL LOW (ref 12.0–15.0)
MCH: 26.8 pg (ref 26.0–34.0)
MCHC: 32.9 g/dL (ref 30.0–36.0)
MCV: 81.6 fL (ref 80.0–100.0)
Platelets: 190 10*3/uL (ref 150–400)
RBC: 4.4 MIL/uL (ref 3.87–5.11)
RDW: 13.9 % (ref 11.5–15.5)
WBC: 8.9 10*3/uL (ref 4.0–10.5)
nRBC: 0 % (ref 0.0–0.2)

## 2020-12-26 LAB — PROTIME-INR
INR: 1.1 (ref 0.8–1.2)
Prothrombin Time: 13.3 seconds (ref 11.4–15.2)

## 2020-12-26 LAB — MAGNESIUM: Magnesium: 0.8 mg/dL — CL (ref 1.7–2.4)

## 2020-12-26 LAB — APTT: aPTT: 28 seconds (ref 24–36)

## 2020-12-26 MED ORDER — SODIUM CHLORIDE 0.9% FLUSH
3.0000 mL | Freq: Once | INTRAVENOUS | Status: AC
  Administered 2020-12-26: 3 mL via INTRAVENOUS

## 2020-12-26 MED ORDER — POTASSIUM CHLORIDE CRYS ER 20 MEQ PO TBCR
40.0000 meq | EXTENDED_RELEASE_TABLET | Freq: Once | ORAL | Status: AC
  Administered 2020-12-26: 40 meq via ORAL
  Filled 2020-12-26: qty 2

## 2020-12-26 MED ORDER — POTASSIUM CHLORIDE CRYS ER 20 MEQ PO TBCR
20.0000 meq | EXTENDED_RELEASE_TABLET | Freq: Once | ORAL | Status: AC
  Administered 2020-12-26: 20 meq via ORAL
  Filled 2020-12-26: qty 1

## 2020-12-26 MED ORDER — MAGNESIUM OXIDE 400 (241.3 MG) MG PO TABS
400.0000 mg | ORAL_TABLET | Freq: Once | ORAL | Status: AC
  Administered 2020-12-26: 400 mg via ORAL
  Filled 2020-12-26: qty 1

## 2020-12-26 MED ORDER — POTASSIUM CHLORIDE 10 MEQ/100ML IV SOLN
10.0000 meq | INTRAVENOUS | Status: AC
Start: 2020-12-26 — End: 2020-12-27
  Administered 2020-12-26 (×3): 10 meq via INTRAVENOUS
  Filled 2020-12-26: qty 100

## 2020-12-26 MED ORDER — MAGNESIUM SULFATE 2 GM/50ML IV SOLN
2.0000 g | Freq: Once | INTRAVENOUS | Status: AC
  Administered 2020-12-26: 2 g via INTRAVENOUS
  Filled 2020-12-26 (×2): qty 50

## 2020-12-26 MED ORDER — ONDANSETRON HCL 4 MG/2ML IJ SOLN
4.0000 mg | Freq: Once | INTRAMUSCULAR | Status: AC
  Administered 2020-12-26: 4 mg via INTRAVENOUS
  Filled 2020-12-26: qty 2

## 2020-12-26 MED ORDER — MAGNESIUM SULFATE 2 GM/50ML IV SOLN
2.0000 g | Freq: Once | INTRAVENOUS | Status: AC
  Administered 2020-12-26: 2 g via INTRAVENOUS
  Filled 2020-12-26: qty 50

## 2020-12-26 NOTE — Discharge Instructions (Signed)
Please continue all of your prescription medications at home and follow-up with your primary care doctor for recheck of your potassium and magnesium levels.  If you develop any further worsening symptoms, strokelike symptoms, falls, passing out or fevers, please return to the ED.

## 2020-12-26 NOTE — ED Provider Notes (Signed)
[facility] Emergency Department Provider Note ____________________________________________   Event Date/Time   First MD Initiated Contact with Patient 12/26/20 1656     (approximate)  I have reviewed the triage vital signs and the nursing notes.  HISTORY  Chief Complaint Numbness   HPI Brandy Johnston is a 59 y.o. femalewho presents to the ED for evaluation of numbness, paresthesias and tingling diffusely  Chart review indicates history of HTN, HLD and hypomagnesemia on magnesium supplementation.  She reports she does not take any diuretics or potassium supplementation.  Denies alcohol use.  Patient placed in the ED with a couple days of vague and diffuse intermittent numbness, paresthesias and occasional hyperalgesia.  She denies any trauma, syncopal episodes, headache or falls, denies difficulty ambulating her baseline.  She reports being extraordinarily stressed throughout the COVID-19 pandemic due to losing her job, and reports associated poor p.o. intake and some recent weight loss.  She reports compliance with her magnesium supplementation, but despite this has had 2 days of these symptoms.   Past Medical History:  Diagnosis Date  . Anemia   . Chronic renal impairment, stage 3 (moderate) (HCC)   . Diabetes mellitus without complication ([place])    No longer on meds  . GERD (gastroesophageal reflux disease)   . Hyperlipidemia   . Hypertension   . Hypokalemia   . Insomnia   . Low calcium levels   . Osteoarthrosis, hip    right hip  . Paresthesia   . Sickle cell anemia (HCC)   . Sickle-cell trait ([place])   . Tension headache    1x/mo  . Wears contact lenses     Patient Active Problem List   Diagnosis Date Noted  . Incomplete tear of left rotator cuff 07/03/2020  . Angioedema due to angiotensin converting enzyme inhibitor (ACE-I) 01/29/2020  . Chronic bilateral back pain 07/26/2017  . Bilateral shoulder pain 07/26/2017  . Coronary artery  calcification 05/17/2017  . Heart palpitations 05/15/2017  . History of acute gastritis   . Leukocytosis 09/30/2016  . Atherosclerosis of abdominal aorta ([place]) 08/18/2016  . Type 2 diabetes mellitus with stage 3 chronic kidney disease, without long-term current use of insulin ([place]) 12/16/2015  . Diabetic neuropathy associated with type 2 diabetes mellitus ([place]) 09/18/2015  . Insomnia 09/18/2015  . Benign essential HTN 06/18/2015  . Chronic kidney disease (CKD), stage III (moderate) ([place]) 06/18/2015  . Diabetes mellitus with renal manifestation ([place]) 06/18/2015  . Abnormal serum level of alkaline phosphatase 06/18/2015  . Bilateral low back pain with left-sided sciatica 06/18/2015  . Obesity (BMI 30.0-34.9) 06/18/2015  . Degenerative arthritis of hip 06/18/2015  . Sickle cell trait ([place]) 06/18/2015  . Dyslipidemia 05/27/2010  . Gastro-esophageal reflux disease without esophagitis 05/25/2008    Past Surgical History:  Procedure Laterality Date  . BREAST BIOPSY Right 12/27/2020   negative - done at [facility]   . CHOLECYSTECTOMY    . COLONOSCOPY    . ESOPHAGOGASTRODUODENOSCOPY (EGD) WITH PROPOFOL N/A 10/05/2016   Procedure: ESOPHAGOGASTRODUODENOSCOPY (EGD) WITH PROPOFOL;  Surgeon: Lucilla Lame, MD;  Location: [facility];  Service: Endoscopy;  Laterality: N/A;  . FRACTURE SURGERY Left    cast and pins   . SHOULDER ARTHROSCOPY WITH ROTATOR CUFF REPAIR AND SUBACROMIAL DECOMPRESSION Left 12/07/2018   Procedure: left shoulder manipulation under anesthesia, left shoulder arthroscopic lysis of adhesions;  Surgeon: Lovell Sheehan, MD;  Location: [facility];  Service: Orthopedics;  Laterality: Left;  . SHOULDER CLOSED REDUCTION Left 12/07/2018  Procedure: CLOSED MANIPULATION SHOULDER;  Surgeon: Lovell Sheehan, MD;  Location: [facility];  Service: Orthopedics;  Laterality: Left;  . shoulder surgery  Left 06/10/2018   Dr. Marica Otter  . TUBAL LIGATION      Prior to Admission medications    Medication Sig Start Date End Date Taking? Authorizing Provider  aspirin (ASPIRIN 81) 81 MG chewable tablet Chew 1 tablet (81 mg total) by mouth daily. 05/03/18   Steele Sizer, MD  atorvastatin (LIPITOR) 20 MG tablet Take 1 tablet (20 mg total) by mouth daily. 08/11/19   Steele Sizer, MD  Blood Glucose Monitoring Suppl (CONTOUR NEXT ONE) KIT USE TO TEST BLOOD SUGAR ONCE D 04/21/19   [provider]  Butalbital-APAP-Caffeine 50-300-40 MG CAPS Take 1 capsule by mouth every 4 (four) hours as needed. 04/18/19   Steele Sizer, MD  Cholecalciferol (VITAMIN D) 2000 units CAPS Take 1 capsule (2,000 Units total) by mouth daily. 04/20/17   Steele Sizer, MD  cyclobenzaprine (FLEXERIL) 5 MG tablet TAKE 1 TO 2 TABLETS BY MOUTH TWICE DAILY... TAKE 1 IN THE MORNING. AND TAKE 2 TABLETS IN THE AFTERNOON 05/19/19   Steele Sizer, MD  diltiazem (CARDIZEM CD) 180 MG 24 hr capsule Take 1 capsule (180 mg total) by mouth daily. 01/31/20 03/01/20  Sidney Ace, MD  ergocalciferol (VITAMIN D2) 1.25 MG (50000 UT) capsule Take by mouth. 08/28/16   [provider]  gabapentin (NEURONTIN) 300 MG capsule Take 1 capsule (300 mg total) by mouth 2 (two) times daily. 08/11/19   Steele Sizer, MD  glucose blood test strip Use as instructed 04/21/19   Steele Sizer, MD  magnesium oxide (MAGNESIUM-OXIDE) 400 (241.3 Mg) MG tablet Take 1 tablet (400 mg total) by mouth 2 (two) times daily. 04/18/19   Steele Sizer, MD  Microlet Lancets MISC USE TO CHECK BLOOD SUGAR ONCE D 04/21/19   [provider]  omeprazole (PRILOSEC) 20 MG capsule Take 1 capsule (20 mg total) by mouth daily. 08/11/19   Steele Sizer, MD  promethazine (PHENERGAN) 12.5 MG tablet Take 1 tablet (12.5 mg total) by mouth every 6 (six) hours as needed for nausea or vomiting. 10/11/19   Steele Sizer, MD  Semaglutide,0.25 or 0.5MG/DOS, (OZEMPIC, 0.25 OR 0.5 MG/DOSE,) 2 MG/1.5ML SOPN Inject 0.5 mg into the muscle every Monday. 08/14/19    Steele Sizer, MD  traMADol Veatrice Bourbon) 50 MG tablet 1-2 tablets every 8 hours as needed for pain 06/12/20   [provider]  traZODone (DESYREL) 50 MG tablet Take 0.5-1 tablets (25-50 mg total) by mouth at bedtime as needed for sleep. 11/13/20   Steele Sizer, MD  lisinopril (ZESTRIL) 10 MG tablet Take 1 tablet (10 mg total) by mouth daily. 08/11/19 01/29/20  Steele Sizer, MD    Allergies Ace inhibitors and Lisinopril  Family History  Problem Relation Age of Onset  . Migraines Mother   . Diabetes Mother   . Cancer Mother        lung  . Arthritis Brother   . Breast cancer Maternal Aunt   . Cancer Maternal Uncle        Lung and Colon  . Cirrhosis Brother   . Breast cancer Cousin     Social History Social History   Tobacco Use  . Smoking status: Never Smoker  . Smokeless tobacco: Never Used  Vaping Use  . Vaping Use: Never used  Substance Use Topics  . Alcohol use: Yes    Alcohol/week: 0.0 standard drinks    Comment: rare  .  Drug use: Yes    Types: Marijuana    Comment: smokes marijuana occasionally    Review of Systems  Constitutional: No fever/chills Eyes: No visual changes. ENT: No sore throat. Cardiovascular: Denies chest pain. Respiratory: Denies shortness of breath. Gastrointestinal: No abdominal pain.  No nausea, no vomiting.  No diarrhea.  No constipation. Genitourinary: Negative for dysuria. Musculoskeletal: Negative for back pain. Skin: Negative for rash. Neurological: Negative for headaches, focal weakness.  Positive for paresthesias and numbness diffusely and intermittently  ____________________________________________   PHYSICAL EXAM:  VITAL SIGNS: Vitals:   12/26/20 1922 12/26/20 1923  BP:    Pulse: 69 69  Resp: 12 20  Temp:    SpO2:       Constitutional: Alert and oriented. Well appearing and in no acute distress. Eyes: Conjunctivae are normal. PERRL. EOMI. Head: Atraumatic. Nose: No congestion/rhinnorhea. Mouth/Throat:  Mucous membranes are moist.  Oropharynx non-erythematous. Neck: No stridor. No cervical spine tenderness to palpation. Cardiovascular: Normal rate, regular rhythm. Grossly normal heart sounds.  Good peripheral circulation. Respiratory: Normal respiratory effort.  No retractions. Lungs CTAB. Gastrointestinal: Soft , nondistended, nontender to palpation. No CVA tenderness. Musculoskeletal: No lower extremity tenderness nor edema.  No joint effusions. No signs of acute trauma. Neurologic:  Normal speech and language. No gross focal neurologic deficits are appreciated. No gait instability noted. Cranial nerves II through XII intact 5/5 strength and sensation in all 4 extremities Skin:  Skin is warm, dry and intact. No rash noted. Psychiatric: Mood and affect are normal. Speech and behavior are normal.  ____________________________________________   LABS (all labs ordered are listed, but only abnormal results are displayed)  Labs Reviewed  CBC - Abnormal; Notable for the following components:      Result Value   Hemoglobin 11.8 (*)    HCT 35.9 (*)    All other components within normal limits  COMPREHENSIVE METABOLIC PANEL - Abnormal; Notable for the following components:   Potassium 2.7 (*)    Creatinine, Ser 1.02 (*)    Calcium 7.2 (*)    All other components within normal limits  MAGNESIUM - Abnormal; Notable for the following components:   Magnesium 0.8 (*)    All other components within normal limits  PROTIME-INR  APTT  DIFFERENTIAL  CBG MONITORING, ED  I-STAT CREATININE, ED   ____________________________________________  12 Lead EKG  Rate of 77 bpm.  Normal axis and intervals.  No evidence of acute ischemia ____________________________________________  RADIOLOGY  ED MD interpretation: CT head reviewed by me without evidence of acute intracranial pathology  Official radiology report(s): CT HEAD WO CONTRAST  Result Date: 12/26/2020 CLINICAL DATA:  Dizziness and  headache with blurred vision and weakness. EXAM: CT HEAD WITHOUT CONTRAST TECHNIQUE: Contiguous axial images were obtained from the base of the skull through the vertex without intravenous contrast. COMPARISON:  02/02/2012 FINDINGS: Brain: The brainstem, cerebellum, cerebral peduncles, thalami, basal ganglia, basilar cisterns, and ventricular system appear within normal limits. No intracranial hemorrhage, mass lesion, or acute CVA. Vascular: Unremarkable Skull: Unremarkable Sinuses/Orbits: Unremarkable Other: No supplemental non-categorized findings. IMPRESSION: 1. No significant abnormality identified. Electronically Signed   By: Walter  Liebkemann M.D.   On: 12/26/2020 15:31    ____________________________________________   PROCEDURES and INTERVENTIONS  Procedure(s) performed (including Critical Care):  Procedures  Medications  potassium chloride 10 mEq in 100 mL IVPB (0 mEq Intravenous Stopped 12/26/20 2007)  magnesium sulfate IVPB 2 g 50 mL (has no administration in time range)  sodium chloride flush (NS) 0.9 %   injection 3 mL (3 mLs Intravenous Given 12/26/20 2007)  magnesium sulfate IVPB 2 g 50 mL (0 g Intravenous Stopped 12/26/20 1824)  magnesium oxide (MAG-OX) tablet 400 mg (400 mg Oral Given 12/26/20 1721)  potassium chloride SA (KLOR-CON) CR tablet 40 mEq (40 mEq Oral Given 12/26/20 1722)  ondansetron (ZOFRAN) injection 4 mg (4 mg Intravenous Given 12/26/20 1721)  potassium chloride SA (KLOR-CON) CR tablet 20 mEq (20 mEq Oral Given 12/26/20 2012)    ____________________________________________   MDM / ED COURSE   59 year old woman with history of hypomagnesemia presents to the ED with vague sensorium changes, was consistent with combination of hypomag and hypokalemia, ultimately amenable to replacement and outpatient management.  Normal vitals on room air.  Exam without evidence of distress, trauma or neurovascular deficits.  Blood work with rather severe hypomagnesemia and  hypokalemia, as above.  Otherwise unremarkable.  Her symptoms are most attributable to these electrolyte disturbances.  Combination of oral and IV both mag and potassium were provided to the patient with improvement of her symptoms.  We will discharge with close PCP follow-up for recheck.  Return precautions for the ED were discussed.  Clinical Course as of 12/26/20 2100  Thu Dec 26, 2020  1914 Reassessed.  Patient reports slightly improving symptoms. [DS]  2054 Reassessed.  Patient reports improving symptoms. [DS]    Clinical Course User Index [DS] Vladimir Crofts, MD    ____________________________________________   FINAL CLINICAL IMPRESSION(S) / ED DIAGNOSES  Final diagnoses:  Paresthesia  Numbness  Hypokalemia  Hypomagnesemia  Weakness     ED Discharge Orders    None       Brandy Johnston   Note:  This document was prepared using Dragon voice recognition software and may include unintentional dictation errors.   Vladimir Crofts, MD 12/26/20 2101

## 2020-12-26 NOTE — ED Triage Notes (Addendum)
Pt arrived to ED with c/o tingling and numbness of the legs, arms, and face starting yesterday.   Mini neuro negative. Pt states she feels off balance.   Pt states she has had the tingling happen before, was prescribed magnesium, and takes it on a regular basis.

## 2020-12-26 NOTE — ED Notes (Signed)
Iv started, meds given.  Pt alert.  Speech clear.

## 2020-12-26 NOTE — Telephone Encounter (Signed)
Onset of numbness/tingling in both feet and hands. Began yesterday and has been constant. Legs feel heavy, feels off balance. Denies CP/SOB/fever. Left leg drift present per patient. Advised ED for evaluation. Patient voiced she would have someone drive her to [facility]. Reason for Disposition . [1] Numbness or tingling on both sides of body AND [2] is a new symptom present > 24 hours  Answer Assessment - Initial Assessment Questions 1. SYMPTOM: "What is the main symptom you are concerned about?" (e.g., weakness, numbness)     Numbness and prickly in both feet and hands 2. ONSET: "When did this start?" (minutes, hours, days; while sleeping)     Yesterday 11:00am 3. LAST NORMAL: "When was the last time you were normal (no symptoms)?"     Tuesday 4. PATTERN "Does this come and go, or has it been constant since it started?"  "Is it present now?"    constant 5. CARDIAC SYMPTOMS: "Have you had any of the following symptoms: chest pain, difficulty breathing, palpitations?"     None of this 6. NEUROLOGIC SYMPTOMS: "Have you had any of the following symptoms: headache, dizziness, vision loss, double vision, changes in speech, unsteady on your feet?"     none 7. OTHER SYMPTOMS: "Do you have any other symptoms?" legs feel heavy     8. PREGNANCY: "Is there any chance you are pregnant?" "When was your last menstrual period?"     na  Protocols used: NEUROLOGIC DEFICIT-A-AH

## 2020-12-30 NOTE — Progress Notes (Signed)
Name: Brandy [facility]   MRN: [id number]    DOB: 1961/02/03   Date:12/31/2020       Progress Note  Subjective  Chief Complaint  ER Follow up from 12/26/2020   HPI  EC follow up: [facility] developed leg paresthesias on 12/24/2020, it got progressively worse and she went to [facility] at [facility] on 12/26/2020. She was found to have hypokalemia 2.7  and hypomagnesemia 0.8  Electrolytes were replaced and she has been feeling well since. She has a long history of low magnesium levels and states she has been taking supplements as recommended. She denies any change in diet, water intake, nausea, vomiting or diarrhea prior to episode of paresthesias. Reviewed records from [facility] including labs   HTN: she needs refill of Cardizem, no chest pain or palpitation, she is not on ace due to angioedema   Patient Active Problem List   Diagnosis Date Noted  . Incomplete tear of left rotator cuff 07/03/2020  . Angioedema due to angiotensin converting enzyme inhibitor (ACE-I) 01/29/2020  . Chronic bilateral back pain 07/26/2017  . Bilateral shoulder pain 07/26/2017  . Coronary artery calcification 05/17/2017  . Heart palpitations 05/15/2017  . History of acute gastritis   . Leukocytosis 09/30/2016  . Atherosclerosis of abdominal aorta ([facility]) 08/18/2016  . Type 2 diabetes mellitus with stage 3 chronic kidney disease, without long-term current use of insulin ([facility]) 12/16/2015  . Diabetic neuropathy associated with type 2 diabetes mellitus ([facility]) 09/18/2015  . Insomnia 09/18/2015  . Benign essential HTN 06/18/2015  . Chronic kidney disease (CKD), stage III (moderate) ([facility]) 06/18/2015  . Diabetes mellitus with renal manifestation ([place]) 06/18/2015  . Abnormal serum level of alkaline phosphatase 06/18/2015  . Bilateral low back pain with left-sided sciatica 06/18/2015  . Obesity (BMI 30.0-34.9) 06/18/2015  . Degenerative arthritis of hip 06/18/2015  . Sickle cell trait ([facility]) 06/18/2015  . Dyslipidemia 05/27/2010  .  Gastro-esophageal reflux disease without esophagitis 05/25/2008    Past Surgical History:  Procedure Laterality Date  . BREAST BIOPSY Right 12/27/2020   negative - done at [facility]   . CHOLECYSTECTOMY    . COLONOSCOPY    . ESOPHAGOGASTRODUODENOSCOPY (EGD) WITH PROPOFOL N/A 10/05/2016   Procedure: ESOPHAGOGASTRODUODENOSCOPY (EGD) WITH PROPOFOL;  Surgeon: Lucilla Lame, MD;  Location: [facility];  Service: Endoscopy;  Laterality: N/A;  . FRACTURE SURGERY Left    cast and pins   . SHOULDER ARTHROSCOPY WITH ROTATOR CUFF REPAIR AND SUBACROMIAL DECOMPRESSION Left 12/07/2018   Procedure: left shoulder manipulation under anesthesia, left shoulder arthroscopic lysis of adhesions;  Surgeon: Lovell Sheehan, MD;  Location: [facility];  Service: Orthopedics;  Laterality: Left;  . SHOULDER CLOSED REDUCTION Left 12/07/2018   Procedure: CLOSED MANIPULATION SHOULDER;  Surgeon: Lovell Sheehan, MD;  Location: [facility];  Service: Orthopedics;  Laterality: Left;  . shoulder surgery  Left 06/10/2018   Dr. Marica Otter  . TUBAL LIGATION      Family History  Problem Relation Age of Onset  . Migraines Mother   . Diabetes Mother   . Cancer Mother        lung  . Arthritis Brother   . Breast cancer Maternal Aunt   . Cancer Maternal Uncle        Lung and Colon  . Cirrhosis Brother   . Breast cancer Cousin     Social History   Tobacco Use  . Smoking status: Never Smoker  . Smokeless tobacco: Never Used  Substance Use Topics  . Alcohol use: Yes  Alcohol/week: 0.0 standard drinks    Comment: rare     Current Outpatient Medications:  .  aspirin (ASPIRIN 81) 81 MG chewable tablet, Chew 1 tablet (81 mg total) by mouth daily., Disp: 30 tablet, Rfl: 0 .  atorvastatin (LIPITOR) 20 MG tablet, Take 1 tablet (20 mg total) by mouth daily., Disp: 90 tablet, Rfl: 1 .  Blood Glucose Monitoring Suppl (CONTOUR NEXT ONE) KIT, USE TO TEST BLOOD SUGAR ONCE D, Disp: , Rfl:  .  Butalbital-APAP-Caffeine 50-300-40 MG  CAPS, Take 1 capsule by mouth every 4 (four) hours as needed., Disp: 40 capsule, Rfl: 0 .  Cholecalciferol (VITAMIN D) 2000 units CAPS, Take 1 capsule (2,000 Units total) by mouth daily., Disp: 30 capsule, Rfl: 0 .  cyclobenzaprine (FLEXERIL) 5 MG tablet, TAKE 1 TO 2 TABLETS BY MOUTH TWICE DAILY... TAKE 1 IN THE MORNING. AND TAKE 2 TABLETS IN THE AFTERNOON, Disp: 90 tablet, Rfl: 2 .  ergocalciferol (VITAMIN D2) 1.25 MG (50000 UT) capsule, Take by mouth., Disp: , Rfl:  .  gabapentin (NEURONTIN) 300 MG capsule, Take 1 capsule (300 mg total) by mouth 2 (two) times daily., Disp: 180 capsule, Rfl: 1 .  glucose blood test strip, Use as instructed, Disp: 100 each, Rfl: 12 .  magnesium oxide (MAGNESIUM-OXIDE) 400 (241.3 Mg) MG tablet, Take 1 tablet (400 mg total) by mouth 2 (two) times daily., Disp: 60 tablet, Rfl: 5 .  Microlet Lancets MISC, USE TO CHECK BLOOD SUGAR ONCE D, Disp: , Rfl:  .  omeprazole (PRILOSEC) 20 MG capsule, Take 1 capsule (20 mg total) by mouth daily., Disp: 90 capsule, Rfl: 1 .  promethazine (PHENERGAN) 12.5 MG tablet, Take 1 tablet (12.5 mg total) by mouth every 6 (six) hours as needed for nausea or vomiting., Disp: 12 tablet, Rfl: 0 .  Semaglutide,0.25 or 0.5MG/DOS, (OZEMPIC, 0.25 OR 0.5 MG/DOSE,) 2 MG/1.5ML SOPN, Inject 0.5 mg into the muscle every Monday., Disp: 9 mL, Rfl: 1 .  traMADol (ULTRAM) 50 MG tablet, 1-2 tablets every 8 hours as needed for pain, Disp: , Rfl:  .  traZODone (DESYREL) 50 MG tablet, Take 0.5-1 tablets (25-50 mg total) by mouth at bedtime as needed for sleep., Disp: 30 tablet, Rfl: 2 .  diltiazem (CARDIZEM CD) 180 MG 24 hr capsule, Take 1 capsule (180 mg total) by mouth daily., Disp: 30 capsule, Rfl: 0  Allergies  Allergen Reactions  . Ace Inhibitors Swelling    Angioedema   . Lisinopril Swelling    Face and neck swelling    I personally reviewed active problem list, medication list, allergies, family history, social history with the patient/caregiver  today.   ROS  Ten systems reviewed and is negative except as mentioned in HPI   Objective  Vitals:   12/31/20 1337  BP: 114/72  Pulse: 98  Resp: 16  Temp: 98.2 F (36.8 C)  TempSrc: Oral  SpO2: 97%  Weight: 144 lb 1.6 oz (65.4 kg)  Height: 5' 3"  (1.6 m)    Body mass index is 25.53 kg/m.  Physical Exam  Constitutional: Patient appears well-developed and well-nourished. No distress.  HEENT: head atraumatic, normocephalic, pupils equal and reactive to light, neck supple Cardiovascular: Normal rate, regular rhythm and normal heart sounds.  No murmur heard. No BLE edema. Pulmonary/Chest: Effort normal and breath sounds normal. No respiratory distress. Abdominal: Soft.  There is no tenderness. Psychiatric: Patient has a normal mood and affect. behavior is normal. Judgment and thought content normal.  Recent Results (from the past  2160 hour(s))  POCT HgB A1C     Status: Abnormal   Collection Time: 11/13/20  8:57 AM  Result Value Ref Range   Hemoglobin A1C 5.7 (A) 4.0 - 5.6 %   HbA1c POC (<> result, manual entry)     HbA1c, POC (prediabetic range)     HbA1c, POC (controlled diabetic range)    Microalbumin / creatinine urine ratio     Status: None   Collection Time: 11/13/20 10:01 AM  Result Value Ref Range   Creatinine, Urine 93 20 - 275 mg/dL   Microalb, Ur 0.7 mg/dL    Comment: Reference Range Not established    Microalb Creat Ratio 8 <30 mcg/mg creat    Comment: . The ADA defines abnormalities in albumin excretion as follows: Marland Kitchen Albuminuria Category        Result (mcg/mg creatinine) . Normal to Mildly increased   <30 Moderately increased         30-299  Severely increased           > OR = 300 . The ADA recommends that at least two of three specimens collected within a 3-6 month period be abnormal before considering a patient to be within a diagnostic category.   Lipid panel     Status: Abnormal   Collection Time: 11/13/20 10:01 AM  Result Value Ref Range    Cholesterol 216 (H) <200 mg/dL   HDL 85 > OR = 50 mg/dL   Triglycerides 45 <150 mg/dL   LDL Cholesterol (Calc) 117 (H) mg/dL (calc)    Comment: Reference range: <100 . Desirable range <100 mg/dL for primary prevention;   <70 mg/dL for patients with CHD or diabetic patients  with > or = 2 CHD risk factors. Marland Kitchen LDL-C is now calculated using the Martin-Hopkins  calculation, which is a validated novel method providing  better accuracy than the Friedewald equation in the  estimation of LDL-C.  Cresenciano Genre et al. Annamaria Helling. 2778;242(35): 2061-2068  (http://education.[employer].com/faq/FAQ164)    Total CHOL/HDL Ratio 2.5 <5.0 (calc)   Non-HDL Cholesterol (Calc) 131 (H) <130 mg/dL (calc)    Comment: For patients with diabetes plus 1 major ASCVD risk  factor, treating to a non-HDL-C goal of <100 mg/dL  (LDL-C of <70 mg/dL) is considered a therapeutic  option.   COMPLETE METABOLIC PANEL WITH GFR     Status: Abnormal   Collection Time: 11/13/20 10:01 AM  Result Value Ref Range   Glucose, Bld 86 65 - 99 mg/dL    Comment: .            Fasting reference interval .    BUN 17 7 - 25 mg/dL   Creat 1.15 (H) 0.50 - 0.99 mg/dL    Comment: For patients >34 years of age, the reference limit for Creatinine is approximately 13% higher for people identified as African-American. .    GFR, Est Non African American 51 (L) > OR = 60 mL/min/1.68m   GFR, Est African American 59 (L) > OR = 60 mL/min/1.716m  BUN/Creatinine Ratio 15 6 - 22 (calc)   Sodium 141 135 - 146 mmol/L   Potassium 5.2 3.5 - 5.3 mmol/L   Chloride 102 98 - 110 mmol/L   CO2 29 20 - 32 mmol/L   Calcium 9.8 8.6 - 10.4 mg/dL   Total Protein 7.3 6.1 - 8.1 g/dL   Albumin 4.2 3.6 - 5.1 g/dL   Globulin 3.1 1.9 - 3.7 g/dL (calc)   AG Ratio 1.4 1.0 -  2.5 (calc)   Total Bilirubin 0.4 0.2 - 1.2 mg/dL   Alkaline phosphatase (APISO) 122 37 - 153 U/L   AST 19 10 - 35 U/L   ALT 15 6 - 29 U/L  CBC with Differential/Platelet     Status:  Abnormal   Collection Time: 11/13/20 10:01 AM  Result Value Ref Range   WBC 13.1 (H) 3.8 - 10.8 Thousand/uL   RBC 5.02 3.80 - 5.10 Million/uL   Hemoglobin 13.4 11.7 - 15.5 g/dL   HCT 40.9 35.0 - 45.0 %   MCV 81.5 80.0 - 100.0 fL   MCH 26.7 (L) 27.0 - 33.0 pg   MCHC 32.8 32.0 - 36.0 g/dL   RDW 13.7 11.0 - 15.0 %   Platelets 258 140 - 400 Thousand/uL   MPV 10.8 7.5 - 12.5 fL   Neutro Abs 8,685 (H) 1,500 - 7,800 cells/uL   Lymphs Abs 3,406 850 - 3,900 cells/uL   Absolute Monocytes 734 200 - 950 cells/uL   Eosinophils Absolute 170 15 - 500 cells/uL   Basophils Absolute 105 0 - 200 cells/uL   Neutrophils Relative % 66.3 %   Total Lymphocyte 26.0 %   Monocytes Relative 5.6 %   Eosinophils Relative 1.3 %   Basophils Relative 0.8 %  Magnesium     Status: None   Collection Time: 11/13/20 10:01 AM  Result Value Ref Range   Magnesium 1.9 1.5 - 2.5 mg/dL  Protime-INR     Status: None   Collection Time: 12/26/20  2:24 PM  Result Value Ref Range   Prothrombin Time 13.3 11.4 - 15.2 seconds   INR 1.1 0.8 - 1.2    Comment: (NOTE) INR goal varies based on device and disease states. Performed at [facility], [address] [place], [place] [address]   APTT     Status: None   Collection Time: 12/26/20  2:24 PM  Result Value Ref Range   aPTT 28 24 - 36 seconds    Comment: Performed at [facility], [address] [place], [place] [address]  CBC     Status: Abnormal   Collection Time: 12/26/20  2:24 PM  Result Value Ref Range   WBC 8.9 4.0 - 10.5 K/uL   RBC 4.40 3.87 - 5.11 MIL/uL   Hemoglobin 11.8 (L) 12.0 - 15.0 g/dL   HCT 35.9 (L) 36.0 - 46.0 %   MCV 81.6 80.0 - 100.0 fL   MCH 26.8 26.0 - 34.0 pg   MCHC 32.9 30.0 - 36.0 g/dL   RDW 13.9 11.5 - 15.5 %   Platelets 190 150 - 400 K/uL   nRBC 0.0 0.0 - 0.2 %    Comment: Performed at [facility], [address] [place],  [address]  Differential     Status: None   Collection Time: 12/26/20  2:24  PM  Result Value Ref Range   Neutrophils Relative % 61 %   Neutro Abs 5.5 1.7 - 7.7 K/uL   Lymphocytes Relative 30 %   Lymphs Abs 2.7 0.7 - 4.0 K/uL   Monocytes Relative 6 %   Monocytes Absolute 0.5 0.1 - 1.0 K/uL   Eosinophils Relative 2 %   Eosinophils Absolute 0.2 0.0 - 0.5 K/uL   Basophils Relative 1 %   Basophils Absolute 0.1 0.0 - 0.1 K/uL   Immature Granulocytes 0 %   Abs Immature Granulocytes 0.04 0.00 - 0.07 K/uL    Comment: Performed at [facility], [place]  [address] [place], [place] [address]  Comprehensive metabolic panel     Status: Abnormal   Collection Time: 12/26/20  2:24 PM  Result Value Ref Range   Sodium 142 135 - 145 mmol/L   Potassium 2.7 (LL) 3.5 - 5.1 mmol/L    Comment: CRITICAL RESULT CALLED TO, READ BACK BY AND VERIFIED WITH KENDALL [facility] 12/26/20 AT 1500. MF    Chloride 101 98 - 111 mmol/L   CO2 28 22 - 32 mmol/L   Glucose, Bld 87 70 - 99 mg/dL    Comment: Glucose reference range applies only to samples taken after fasting for at least 8 hours.   BUN 10 8 - 23 mg/dL   Creatinine, Ser 1.02 (H) 0.44 - 1.00 mg/dL   Calcium 7.2 (L) 8.9 - 10.3 mg/dL   Total Protein 7.4 6.5 - 8.1 g/dL   Albumin 3.8 3.5 - 5.0 g/dL   AST 21 15 - 41 U/L   ALT 13 0 - 44 U/L   Alkaline Phosphatase 91 38 - 126 U/L   Total Bilirubin 0.7 0.3 - 1.2 mg/dL   GFR, Estimated >60 >60 mL/min    Comment: (NOTE) Calculated using the CKD-EPI Creatinine Equation (2021)    Anion gap 13 5 - 15    Comment: Performed at [facility], [address] [place], [place] [address]  Magnesium     Status: Abnormal   Collection Time: 12/26/20  2:24 PM  Result Value Ref Range   Magnesium 0.8 (LL) 1.7 - 2.4 mg/dL    Comment: CRITICAL RESULT CALLED TO, READ BACK BY AND VERIFIED WITH MEGAN JONES 12/26/20 AT 1510 BY ACR Performed at [facility], [address] [place], [place] [address]      PHQ2/9: Depression screen [facility] 2/9 12/31/2020 11/13/2020 08/11/2019  04/18/2019 01/09/2019  Decreased Interest 0 0 0 0 1  Down, Depressed, Hopeless 0 0 0 0 0  PHQ - 2 Score 0 0 0 0 1  Altered sleeping - 1 0 0 2  Tired, decreased energy - 0 0 0 1  Change in appetite - 1 0 0 0  Feeling bad or failure about yourself  - 0 0 0 0  Trouble concentrating - 0 0 0 0  Moving slowly or fidgety/restless - 0 0 0 0  Suicidal thoughts - 0 0 0 0  PHQ-9 Score - 2 0 0 4  Difficult doing work/chores - Not difficult at all - - Not difficult at all  Some recent data might be hidden    phq 9 is negative   Fall Risk: Fall Risk  12/31/2020 11/13/2020 08/11/2019 04/18/2019 01/09/2019  Falls in the past year? 1 1 0 0 1  Comment - - - - -  Number falls in past yr: 0 0 0 0 0  Injury with Fall? 1 1 0 0 1  Comment - - - - At work Jan. 16 2019  Risk for fall due to : History of fall(s) - - - -  Follow up - - - - -     Functional Status Survey: Is the patient deaf or have difficulty hearing?: No Does the patient have difficulty seeing, even when wearing glasses/contacts?: Yes Does the patient have difficulty concentrating, remembering, or making decisions?: No Does the patient have difficulty walking or climbing stairs?: No Does the patient have difficulty dressing or bathing?: No Does the patient have difficulty doing errands alone such as visiting a doctor's office or shopping?: No   Assessment &  Plan  1. Low magnesium level  - BASIC METABOLIC PANEL WITH GFR - Magnesium - Ambulatory referral to Nephrology  2. Paresthesia  resolved  3. Hypokalemia  - BASIC METABOLIC PANEL WITH GFR - Magnesium - Ambulatory referral to Nephrology  4. Electrolyte disturbance  - Ambulatory referral to Nephrology  5. Benign essential HTN  - diltiazem (CARDIZEM CD) 180 MG 24 hr capsule; Take 1 capsule (180 mg total) by mouth daily.  Dispense: 90 capsule; Refill: 0

## 2020-12-31 ENCOUNTER — Other Ambulatory Visit: Payer: Self-pay

## 2020-12-31 ENCOUNTER — Encounter: Payer: Self-pay | Admitting: Family Medicine

## 2020-12-31 ENCOUNTER — Ambulatory Visit (INDEPENDENT_AMBULATORY_CARE_PROVIDER_SITE_OTHER): Payer: Medicare (Managed Care) | Admitting: Family Medicine

## 2020-12-31 VITALS — BP 114/72 | HR 98 | Temp 98.2°F | Resp 16 | Ht 63.0 in | Wt 144.1 lb

## 2020-12-31 DIAGNOSIS — R202 Paresthesia of skin: Secondary | ICD-10-CM

## 2020-12-31 DIAGNOSIS — R790 Abnormal level of blood mineral: Secondary | ICD-10-CM

## 2020-12-31 DIAGNOSIS — E876 Hypokalemia: Secondary | ICD-10-CM

## 2020-12-31 DIAGNOSIS — E878 Other disorders of electrolyte and fluid balance, not elsewhere classified: Secondary | ICD-10-CM

## 2020-12-31 DIAGNOSIS — I10 Essential (primary) hypertension: Secondary | ICD-10-CM

## 2020-12-31 LAB — BASIC METABOLIC PANEL WITH GFR
BUN/Creatinine Ratio: 13 (calc) (ref 6–22)
BUN: 15 mg/dL (ref 7–25)
CO2: 29 mmol/L (ref 20–32)
Calcium: 10.1 mg/dL (ref 8.6–10.4)
Chloride: 104 mmol/L (ref 98–110)
Creat: 1.17 mg/dL — ABNORMAL HIGH (ref 0.50–0.99)
GFR, Est African American: 58 mL/min/{1.73_m2} — ABNORMAL LOW (ref >=60)
GFR, Est Non African American: 50 mL/min/{1.73_m2} — ABNORMAL LOW (ref >=60)
Glucose, Bld: 96 mg/dL (ref 65–99)
Potassium: 5 mmol/L (ref 3.5–5.3)
Sodium: 141 mmol/L (ref 135–146)

## 2020-12-31 LAB — MAGNESIUM: Magnesium: 1.6 mg/dL (ref 1.5–2.5)

## 2020-12-31 MED ORDER — DILTIAZEM HCL ER COATED BEADS 180 MG PO CP24
180.0000 mg | ORAL_CAPSULE | Freq: Every day | ORAL | 0 refills | Status: DC
Start: 2020-12-31 — End: 2021-01-09

## 2021-01-09 ENCOUNTER — Other Ambulatory Visit: Payer: Self-pay | Admitting: Family Medicine

## 2021-01-09 DIAGNOSIS — I10 Essential (primary) hypertension: Secondary | ICD-10-CM

## 2021-01-10 ENCOUNTER — Other Ambulatory Visit: Payer: Self-pay | Admitting: Family Medicine

## 2021-01-10 MED ORDER — MAGNESIUM OXIDE 400 (241.3 MG) MG PO TABS: 1.0000 | ORAL_TABLET | Freq: Two times a day (BID) | ORAL | 5 refills | Status: DC

## 2021-01-10 NOTE — Telephone Encounter (Signed)
Medication Refill - Medication: magnesium oxide (MAGNESIUM-OXIDE) 400 (241.3 Mg) MG tablet (Pharmacy stated that patient is requesting medication be sent over today as soon as possible due to being completely out of medication.)   Has the patient contacted their pharmacy? yes (Agent: If no, request that the patient contact the pharmacy for the refill.) (Agent: If yes, when and what did the pharmacy advise?)Contact PCP  Preferred Pharmacy (with phone number or street name):  [id number] [place], [place] - [address] AT [facility] Phone:  [phone]  Fax:  [phone]       Agent: Please be advised that RX refills may take up to 3 business days. We ask that you follow-up with your pharmacy.

## 2021-01-14 DIAGNOSIS — E785 Hyperlipidemia, unspecified: Secondary | ICD-10-CM | POA: Insufficient documentation

## 2021-01-14 NOTE — Progress Notes (Signed)
Patient: Brandy Johnston, Female    DOB: 27-Apr-1961, 60 y.o.   MRN: [id number]  Visit Date: 01/15/2021  Today's Provider: Loistine Chance, MD   Chief Complaint  Patient presents with  . welcome to Medicare    Subjective:    HPI   Brandy Johnston is a 60 y.o. female who presents today for her  Welcome to [employer] Visit.  Patient/Caregiver input:  She saw nephrologist that took her off omeprazole and advised pepcid 20 mg BID , she would like a rx to offset cost, she is going back for follow up soon  Review of Systems  Constitutional: Negative for fever or weight change.  Respiratory: Negative for cough and shortness of breath.   Cardiovascular: Negative for chest pain or palpitations.  Gastrointestinal: Negative for abdominal pain, no bowel changes.  Musculoskeletal: Negative for gait problem or joint swelling.  Skin: Negative for rash.  Neurological: Negative for dizziness or headache.  No other specific complaints in a complete review of systems (except as listed in HPI above).  Past Medical History:  Diagnosis Date  . Anemia   . Chronic renal impairment, stage 3 (moderate) (HCC)   . Diabetes mellitus without complication ([place])    No longer on meds  . GERD (gastroesophageal reflux disease)   . Hyperlipidemia   . Hypertension   . Hypokalemia   . Insomnia   . Low calcium levels   . Osteoarthrosis, hip    right hip  . Paresthesia   . Sickle cell anemia (HCC)   . Sickle-cell trait ([place])   . Tension headache    1x/mo  . Wears contact lenses     Past Surgical History:  Procedure Laterality Date  . BREAST BIOPSY Right 12/27/2020   negative - done at [facility]   . CHOLECYSTECTOMY    . COLONOSCOPY    . ESOPHAGOGASTRODUODENOSCOPY (EGD) WITH PROPOFOL N/A 10/05/2016   Procedure: ESOPHAGOGASTRODUODENOSCOPY (EGD) WITH PROPOFOL;  Surgeon: Lucilla Lame, MD;  Location: [facility];  Service: Endoscopy;  Laterality: N/A;  . FRACTURE SURGERY Left    cast and pins    . SHOULDER ARTHROSCOPY WITH ROTATOR CUFF REPAIR AND SUBACROMIAL DECOMPRESSION Left 12/07/2018   Procedure: left shoulder manipulation under anesthesia, left shoulder arthroscopic lysis of adhesions;  Surgeon: Lovell Sheehan, MD;  Location: [facility];  Service: Orthopedics;  Laterality: Left;  . SHOULDER CLOSED REDUCTION Left 12/07/2018   Procedure: CLOSED MANIPULATION SHOULDER;  Surgeon: Lovell Sheehan, MD;  Location: [facility];  Service: Orthopedics;  Laterality: Left;  . shoulder surgery  Left 06/10/2018   Dr. Marica Otter  . TUBAL LIGATION      Family History  Problem Relation Age of Onset  . Migraines Mother   . Diabetes Mother   . Cancer Mother        lung  . Arthritis Brother   . Breast cancer Maternal Aunt   . Cancer Maternal Uncle        Lung and Colon  . Cirrhosis Brother   . Breast cancer Cousin     Social History   Socioeconomic History  . Marital status: Single    Spouse name: Not on file  . Number of children: 2  . Years of education: Not on file  . Highest education level: 12th grade  Occupational History  . Occupation: disabled  Tobacco Use  . Smoking status: Never Smoker  . Smokeless tobacco: Never Used  Vaping Use  . Vaping Use: Never used  Substance and Sexual Activity  . Alcohol use: Yes    Alcohol/week: 0.0 standard drinks    Comment: rare  . Drug use: Yes    Types: Marijuana    Comment: smokes marijuana occasionally  . Sexual activity: Yes    Partners: Female    Birth control/protection: Other-see comments  Other Topics Concern  . 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 Difficulty of Paying Living Expenses: Hard  Food Insecurity: No Food Insecurity  . Worried About Charity fundraiser in the Last Year:  Never true  . Ran Out of Food in the Last Year: Never true  Transportation Needs: No Transportation Needs  . Lack of Transportation (Medical): No  . Lack of Transportation (Non-Medical): No  Physical Activity: Insufficiently Active  . Days of Exercise per Week: 3 days  . Minutes of Exercise per Session: 30 min  Stress: No Stress Concern Present  . Feeling of Stress : Not at all  Social Connections: Socially Isolated  . Frequency of Communication with Friends and Family: More than three times a week  . Frequency of Social Gatherings with Friends and Family: Once a week  . Attends Religious Services: Never  . Active Member of Clubs or Organizations: No  . Attends Archivist Meetings: Never  . Marital Status: Never married  Intimate Partner Violence: Not At Risk  . Fear of Current or Ex-Partner: No  . Emotionally Abused: No  . Physically Abused: No  . Sexually Abused: No    Outpatient Encounter Medications as of 01/15/2021  Medication Sig Note  . amLODipine-valsartan (EXFORGE) 5-160 MG tablet 1 (one) time each day   . aspirin (ASPIRIN 81) 81 MG chewable tablet Chew 1 tablet (81 mg total) by mouth daily.   Marland Kitchen atorvastatin (LIPITOR) 20 MG tablet Take 1 tablet (20 mg total) by mouth daily.   . Blood Glucose Monitoring Suppl (CONTOUR NEXT ONE) KIT USE TO TEST BLOOD SUGAR ONCE D   . Butalbital-APAP-Caffeine 50-300-40 MG CAPS Take 1 capsule by mouth every 4 (four) hours as needed.   . Cholecalciferol (VITAMIN D) 2000 units CAPS Take 1 capsule (2,000 Units total) by mouth daily.   . cyclobenzaprine (FLEXERIL) 5 MG tablet TAKE 1 TO 2 TABLETS BY MOUTH TWICE DAILY... TAKE 1 IN THE MORNING. AND TAKE 2 TABLETS IN THE AFTERNOON   . diltiazem (CARDIZEM CD) 180 MG 24 hr capsule TAKE 1 CAPSULE(180 MG) BY MOUTH DAILY   . diphenhydrAMINE (SOMINEX) 25 MG tablet Take by mouth.   . ergocalciferol (VITAMIN D2) 1.25 MG (50000 UT) capsule Take by mouth.   . erythromycin ophthalmic ointment  Administer to the right eye Three (3) times a day.   . gabapentin (NEURONTIN) 300 MG capsule Take 1 capsule (300 mg total) by mouth 2 (two) times daily.   Marland Kitchen glucose blood test strip Use as instructed   . loratadine (CLARITIN) 10 MG tablet Take by mouth.   . magnesium oxide (MAG-OX) 400 MG tablet Take by mouth.   . magnesium oxide (MAGNESIUM-OXIDE) 400 (241.3 Mg) MG tablet Take 1 tablet (400 mg total) by mouth 2 (two) times daily.   . meloxicam (MOBIC) 7.5 MG tablet Take by mouth.   Marland Kitchen  Microlet Lancets MISC USE TO CHECK BLOOD SUGAR ONCE D   . promethazine (PHENERGAN) 12.5 MG tablet Take 1 tablet (12.5 mg total) by mouth every 6 (six) hours as needed for nausea or vomiting.   . Semaglutide,0.25 or 0.5MG/DOS, (OZEMPIC, 0.25 OR 0.5 MG/DOSE,) 2 MG/1.5ML SOPN Inject 0.5 mg into the muscle every Monday.   . temazepam (RESTORIL) 15 MG capsule if needed   . traMADol (ULTRAM) 50 MG tablet 1-2 tablets every 8 hours as needed for pain   . traZODone (DESYREL) 50 MG tablet Take 0.5-1 tablets (25-50 mg total) by mouth at bedtime as needed for sleep.   . [DISCONTINUED] omeprazole (PRILOSEC) 20 MG capsule Take 1 capsule (20 mg total) by mouth daily. 01/15/2021: Dr. Lanora Manis  . [DISCONTINUED] lisinopril (ZESTRIL) 10 MG tablet Take 1 tablet (10 mg total) by mouth daily.    No facility-administered encounter medications on file as of 01/15/2021.    Allergies  Allergen Reactions  . Ace Inhibitors Swelling    Angioedema   . Lisinopril Swelling    Face and neck swelling    Care Team Updated in EHR: Yes  Last Vision Exam: Wears corrective lenses: Yes Last Dental Exam: she will schedule it, she has partial dentures  Last Hearing Exam:  Wears Hearing Aids: No  Functional Ability / Safety Screening 1.  Was the timed Get Up and Go test shorter than 30 seconds?  yes 2.  Does the patient need help with the phone, transportation, shopping,      preparing meals, housework, laundry, medications, or managing money?   yes 3.  Is the patient's home free of loose throw rugs in walkways, pet beds, electrical cords, etc?   yes      Grab bars in the bathroom? No      Handrails on the stairs?   yes      Adequate lighting?   yes 4.  Has the patient noticed any hearing difficulties?   no  Diet Recall and Exercise Regimen:   Currently not active but will enroll with silver sneakers She has added more vegetables to her diet lately, more fruit, drinking more water, she drinks milk daily    Advanced Care Planning: A voluntary discussion about advance care planning including the explanation and discussion of advance directives.  Discussed health care proxy and Living will, and the patient was able to identify a health care proxy as son or sister.  Patient does not have a living will at present time. If patient does have living will, I have requested they bring this to the clinic to be scanned in to their chart. Does patient have a HCPOA?    no If yes, name and contact information: N/A Does patient have a living will or MOST form?  no  Cancer Screenings: Skin: discussed atypical lesions  Lung:  Low Dose CT Chest recommended if Age 69-80 years, 30 pack-year currently smoking OR have quit w/in 15years. Patient does not qualify. Breast:  Up to date on Mammogram? Yes  Up to date of Bone Density/Dexa? No Colon: up to date   Additional Screenings:  Hepatitis B/HIV/Syphillis:not interested  Hepatitis C Screening: 2013 Intimate Partner Violence: negative screen   Objective:   Vitals: BP 122/62   Pulse 76   Temp 98.5 F (36.9 C) (Oral)   Resp 16   Ht 5' 3"  (1.6 m)   Wt 145 lb 8 oz (66 kg)   SpO2 99%   BMI 25.77 kg/m  Body mass index  is 25.77 kg/m.   Hearing Screening   125Hz  250Hz  500Hz  1000Hz  2000Hz  3000Hz  4000Hz  6000Hz  8000Hz   Right ear:   Pass Pass Pass  Pass    Left ear:   Pass Pass Pass  Pass      Visual Acuity Screening   Right eye Left eye Both eyes  Without correction:     With correction:  20/40 20/20 20/20     Physical Exam Constitutional: Patient appears well-developed and well-nourished. Obese  No distress.  HEENT: head atraumatic, normocephalic, pupils equal and reactive to light, neck supple Cardiovascular: Normal rate, regular rhythm and normal heart sounds.  No murmur heard. No BLE edema. Pulmonary/Chest: Effort normal and breath sounds normal. No respiratory distress. Abdominal: Soft.  There is no tenderness. Psychiatric: Patient has a normal mood and affect. behavior is normal. Judgment and thought content normal.  Cognitive Testing - 6-CIT  Correct? Score   What year is it? yes 0 Yes = 0    No = 4  What month is it? yes 0 Yes = 0    No = 3  Remember:     Pia Mau, [place], [place]     What time is it? yes 0 Yes = 0    No = 3  Count backwards from 20 to 1 yes 0 Correct = 0    1 error = 2   More than 1 error = 4  Say the months of the year in reverse. yes 0 Correct = 0    1 error = 2   More than 1 error = 4  What address did I ask you to remember? yes 0 Correct = 0  1 error = 2    2 error = 4    3 error = 6    4 error = 8    All wrong = 10       TOTAL SCORE  0/28   Interpretation:  Normal  Normal (0-7) Abnormal (8-28)   Fall Risk: Fall Risk  01/15/2021 12/31/2020 11/13/2020 08/11/2019 04/18/2019  Falls in the past year? 1 1 1  0 0  Comment - - - - -  Number falls in past yr: 0 0 0 0 0  Injury with Fall? 1 1 1  0 0  Comment - - - - -  Risk for fall due to : - History of fall(s) - - -  Follow up - - - - -    Depression Screen Depression screen [place] 2/9 01/15/2021 12/31/2020 11/13/2020 08/11/2019 04/18/2019  Decreased Interest 0 0 0 0 0  Down, Depressed, Hopeless 0 0 0 0 0  PHQ - 2 Score 0 0 0 0 0  Altered sleeping - - 1 0 0  Tired, decreased energy - - 0 0 0  Change in appetite - - 1 0 0  Feeling bad or failure about yourself  - - 0 0 0  Trouble concentrating - - 0 0 0  Moving slowly or fidgety/restless - - 0 0 0  Suicidal thoughts - - 0 0 0  PHQ-9  Score - - 2 0 0  Difficult doing work/chores - - Not difficult at all - -  Some recent data might be hidden    Recent Results (from the past 2160 hour(s))  POCT HgB A1C     Status: Abnormal   Collection Time: 11/13/20  8:57 AM  Result Value Ref Range   Hemoglobin A1C 5.7 (A) 4.0 - 5.6 %  HbA1c POC (<> result, manual entry)     HbA1c, POC (prediabetic range)     HbA1c, POC (controlled diabetic range)    Microalbumin / creatinine urine ratio     Status: None   Collection Time: 11/13/20 10:01 AM  Result Value Ref Range   Creatinine, Urine 93 20 - 275 mg/dL   Microalb, Ur 0.7 mg/dL    Comment: Reference Range Not established    Microalb Creat Ratio 8 <30 mcg/mg creat    Comment: . The ADA defines abnormalities in albumin excretion as follows: Marland Kitchen Albuminuria Category        Result (mcg/mg creatinine) . Normal to Mildly increased   <30 Moderately increased         30-299  Severely increased           > OR = 300 . The ADA recommends that at least two of three specimens collected within a 3-6 month period be abnormal before considering a patient to be within a diagnostic category.   Lipid panel     Status: Abnormal   Collection Time: 11/13/20 10:01 AM  Result Value Ref Range   Cholesterol 216 (H) <200 mg/dL   HDL 85 > OR = 50 mg/dL   Triglycerides 45 <150 mg/dL   LDL Cholesterol (Calc) 117 (H) mg/dL (calc)    Comment: Reference range: <100 . Desirable range <100 mg/dL for primary prevention;   <70 mg/dL for patients with CHD or diabetic patients  with > or = 2 CHD risk factors. Marland Kitchen LDL-C is now calculated using the Martin-Hopkins  calculation, which is a validated novel method providing  better accuracy than the Friedewald equation in the  estimation of LDL-C.  Cresenciano Genre et al. Annamaria Helling. 0960;454(09): 2061-2068  (http://education.[facility].com/faq/FAQ164)    Total CHOL/HDL Ratio 2.5 <5.0 (calc)   Non-HDL Cholesterol (Calc) 131 (H) <130 mg/dL (calc)    Comment: For  patients with diabetes plus 1 major ASCVD risk  factor, treating to a non-HDL-C goal of <100 mg/dL  (LDL-C of <70 mg/dL) is considered a therapeutic  option.   COMPLETE METABOLIC PANEL WITH GFR     Status: Abnormal   Collection Time: 11/13/20 10:01 AM  Result Value Ref Range   Glucose, Bld 86 65 - 99 mg/dL    Comment: .            Fasting reference interval .    BUN 17 7 - 25 mg/dL   Creat 1.15 (H) 0.50 - 0.99 mg/dL    Comment: For patients >67 years of age, the reference limit for Creatinine is approximately 13% higher for people identified as African-American. .    GFR, Est Non African American 51 (L) > OR = 60 mL/min/1.89m   GFR, Est African American 59 (L) > OR = 60 mL/min/1.754m  BUN/Creatinine Ratio 15 6 - 22 (calc)   Sodium 141 135 - 146 mmol/L   Potassium 5.2 3.5 - 5.3 mmol/L   Chloride 102 98 - 110 mmol/L   CO2 29 20 - 32 mmol/L   Calcium 9.8 8.6 - 10.4 mg/dL   Total Protein 7.3 6.1 - 8.1 g/dL   Albumin 4.2 3.6 - 5.1 g/dL   Globulin 3.1 1.9 - 3.7 g/dL (calc)   AG Ratio 1.4 1.0 - 2.5 (calc)   Total Bilirubin 0.4 0.2 - 1.2 mg/dL   Alkaline phosphatase (APISO) 122 37 - 153 U/L   AST 19 10 - 35 U/L   ALT 15 6 - 29 U/L  CBC with Differential/Platelet     Status: Abnormal   Collection Time: 11/13/20 10:01 AM  Result Value Ref Range   WBC 13.1 (H) 3.8 - 10.8 Thousand/uL   RBC 5.02 3.80 - 5.10 Million/uL   Hemoglobin 13.4 11.7 - 15.5 g/dL   HCT 40.9 35.0 - 45.0 %   MCV 81.5 80.0 - 100.0 fL   MCH 26.7 (L) 27.0 - 33.0 pg   MCHC 32.8 32.0 - 36.0 g/dL   RDW 13.7 11.0 - 15.0 %   Platelets 258 140 - 400 Thousand/uL   MPV 10.8 7.5 - 12.5 fL   Neutro Abs 8,685 (H) 1,500 - 7,800 cells/uL   Lymphs Abs 3,406 850 - 3,900 cells/uL   Absolute Monocytes 734 200 - 950 cells/uL   Eosinophils Absolute 170 15 - 500 cells/uL   Basophils Absolute 105 0 - 200 cells/uL   Neutrophils Relative % 66.3 %   Total Lymphocyte 26.0 %   Monocytes Relative 5.6 %   Eosinophils Relative 1.3  %   Basophils Relative 0.8 %  Magnesium     Status: None   Collection Time: 11/13/20 10:01 AM  Result Value Ref Range   Magnesium 1.9 1.5 - 2.5 mg/dL  Protime-INR     Status: None   Collection Time: 12/26/20  2:24 PM  Result Value Ref Range   Prothrombin Time 13.3 11.4 - 15.2 seconds   INR 1.1 0.8 - 1.2    Comment: (NOTE) INR goal varies based on device and disease states. Performed at [facility], [address] [place], [place] [address]   APTT     Status: None   Collection Time: 12/26/20  2:24 PM  Result Value Ref Range   aPTT 28 24 - 36 seconds    Comment: Performed at [facility], [address] [place], [place] [address]  CBC     Status: Abnormal   Collection Time: 12/26/20  2:24 PM  Result Value Ref Range   WBC 8.9 4.0 - 10.5 K/uL   RBC 4.40 3.87 - 5.11 MIL/uL   Hemoglobin 11.8 (L) 12.0 - 15.0 g/dL   HCT 35.9 (L) 36.0 - 46.0 %   MCV 81.6 80.0 - 100.0 fL   MCH 26.8 26.0 - 34.0 pg   MCHC 32.9 30.0 - 36.0 g/dL   RDW 13.9 11.5 - 15.5 %   Platelets 190 150 - 400 K/uL   nRBC 0.0 0.0 - 0.2 %    Comment: Performed at [facility], [address] [place], [place] [address]  Differential     Status: None   Collection Time: 12/26/20  2:24 PM  Result Value Ref Range   Neutrophils Relative % 61 %   Neutro Abs 5.5 1.7 - 7.7 K/uL   Lymphocytes Relative 30 %   Lymphs Abs 2.7 0.7 - 4.0 K/uL   Monocytes Relative 6 %   Monocytes Absolute 0.5 0.1 - 1.0 K/uL   Eosinophils Relative 2 %   Eosinophils Absolute 0.2 0.0 - 0.5 K/uL   Basophils Relative 1 %   Basophils Absolute 0.1 0.0 - 0.1 K/uL   Immature Granulocytes 0 %   Abs Immature Granulocytes 0.04 0.00 - 0.07 K/uL    Comment: Performed at [facility], [address] [place], [place] [address]  Comprehensive metabolic panel     Status: Abnormal   Collection Time: 12/26/20  2:24 PM  Result Value Ref Range   Sodium 142 135 - 145 mmol/L   Potassium  2.7 (LL) 3.5 - 5.1 mmol/L     Comment: CRITICAL RESULT CALLED TO, READ BACK BY AND VERIFIED WITH K[facility] 12/26/20 AT 1500. MF    Chloride 101 98 - 111 mmol/L   CO2 28 22 - 32 mmol/L   Glucose, Bld 87 70 - 99 mg/dL    Comment: Glucose reference range applies only to samples taken after fasting for at least 8 hours.   BUN 10 8 - 23 mg/dL   Creatinine, Ser 1.02 (H) 0.44 - 1.00 mg/dL   Calcium 7.2 (L) 8.9 - 10.3 mg/dL   Total Protein 7.4 6.5 - 8.1 g/dL   Albumin 3.8 3.5 - 5.0 g/dL   AST 21 15 - 41 U/L   ALT 13 0 - 44 U/L   Alkaline Phosphatase 91 38 - 126 U/L   Total Bilirubin 0.7 0.3 - 1.2 mg/dL   GFR, Estimated >60 >60 mL/min    Comment: (NOTE) Calculated using the CKD-EPI Creatinine Equation (2021)    Anion gap 13 5 - 15    Comment: Performed at [facility], [address] [place], [place] [address]  Magnesium     Status: Abnormal   Collection Time: 12/26/20  2:24 PM  Result Value Ref Range   Magnesium 0.8 (LL) 1.7 - 2.4 mg/dL    Comment: CRITICAL RESULT CALLED TO, READ BACK BY AND VERIFIED WITH MEGAN JONES 12/26/20 AT 1510 BY ACR Performed at [facility], [address] [place], [place] [address]   BASIC METABOLIC PANEL WITH GFR     Status: Abnormal   Collection Time: 12/31/20  1:54 PM  Result Value Ref Range   Glucose, Bld 96 65 - 99 mg/dL    Comment: .            Fasting reference interval .    BUN 15 7 - 25 mg/dL   Creat 1.17 (H) 0.50 - 0.99 mg/dL    Comment: For patients >73 years of age, the reference limit for Creatinine is approximately 13% higher for people identified as African-American. .    GFR, Est Non African American 50 (L) > OR = 60 mL/min/1.66m   GFR, Est African American 58 (L) > OR = 60 mL/min/1.728m  BUN/Creatinine Ratio 13 6 - 22 (calc)   Sodium 141 135 - 146 mmol/L   Potassium 5.0 3.5 - 5.3 mmol/L   Chloride 104 98 - 110 mmol/L   CO2 29 20 - 32 mmol/L   Calcium 10.1 8.6 - 10.4 mg/dL  Magnesium     Status: None   Collection Time: 12/31/20   1:54 PM  Result Value Ref Range   Magnesium 1.6 1.5 - 2.5 mg/dL    Assessment & Plan:    There are no diagnoses linked to this encounter.    Exercise Activities and Dietary recommendations  Continue water, fruit, vegetables   - Discussed health benefits of physical activity, and encouraged her to engage in regular exercise appropriate for her age and condition.   Immunization History  Administered Date(s) Administered  . Influenza, Seasonal, Injecte, Preservative Fre 08/19/2012  . Influenza,inj,Quad PF,6+ Mos 08/09/2014, 08/16/2015, 01/20/2017, 09/16/2017, 09/19/2018  . Influenza-Unspecified 08/09/2014, 10/07/2020  . PFIZER(Purple Top)SARS-COV-2 Vaccination 03/02/2020, 03/23/2020, 10/28/2020  . Pneumococcal Conjugate-13 12/03/2015  . Pneumococcal Polysaccharide-23 06/19/2010  . Tdap 05/25/2008, 09/28/2018    Health Maintenance  Topic Date Due  . MAMMOGRAM  09/22/2020  . OPHTHALMOLOGY EXAM  01/15/2021 (Originally 09/29/2018)  . HEMOGLOBIN A1C  05/13/2021  . PAP  SMEAR-Modifier  08/19/2021  . FOOT EXAM  11/13/2021  . TETANUS/TDAP  09/28/2028  . COLONOSCOPY (Pts 45-37yr Insurance coverage will need to be confirmed)  04/17/2030  . INFLUENZA VACCINE  Completed  . PNEUMOCOCCAL POLYSACCHARIDE VACCINE AGE 71-64 HIGH RISK  Completed  . COVID-19 Vaccine  Completed  . Hepatitis C Screening  Completed  . HIV Screening  Completed    No orders of the defined types were placed in this encounter.   Current Outpatient Medications:  .  amLODipine-valsartan (EXFORGE) 5-160 MG tablet, 1 (one) time each day, Disp: , Rfl:  .  aspirin (ASPIRIN 81) 81 MG chewable tablet, Chew 1 tablet (81 mg total) by mouth daily., Disp: 30 tablet, Rfl: 0 .  atorvastatin (LIPITOR) 20 MG tablet, Take 1 tablet (20 mg total) by mouth daily., Disp: 90 tablet, Rfl: 1 .  Blood Glucose Monitoring Suppl (CONTOUR NEXT ONE) KIT, USE TO TEST BLOOD SUGAR ONCE D, Disp: , Rfl:  .  Butalbital-APAP-Caffeine 50-300-40 MG  CAPS, Take 1 capsule by mouth every 4 (four) hours as needed., Disp: 40 capsule, Rfl: 0 .  Cholecalciferol (VITAMIN D) 2000 units CAPS, Take 1 capsule (2,000 Units total) by mouth daily., Disp: 30 capsule, Rfl: 0 .  cyclobenzaprine (FLEXERIL) 5 MG tablet, TAKE 1 TO 2 TABLETS BY MOUTH TWICE DAILY... TAKE 1 IN THE MORNING. AND TAKE 2 TABLETS IN THE AFTERNOON, Disp: 90 tablet, Rfl: 2 .  diltiazem (CARDIZEM CD) 180 MG 24 hr capsule, TAKE 1 CAPSULE(180 MG) BY MOUTH DAILY, Disp: 90 capsule, Rfl: 0 .  diphenhydrAMINE (SOMINEX) 25 MG tablet, Take by mouth., Disp: , Rfl:  .  ergocalciferol (VITAMIN D2) 1.25 MG (50000 UT) capsule, Take by mouth., Disp: , Rfl:  .  erythromycin ophthalmic ointment, Administer to the right eye Three (3) times a day., Disp: , Rfl:  .  gabapentin (NEURONTIN) 300 MG capsule, Take 1 capsule (300 mg total) by mouth 2 (two) times daily., Disp: 180 capsule, Rfl: 1 .  glucose blood test strip, Use as instructed, Disp: 100 each, Rfl: 12 .  loratadine (CLARITIN) 10 MG tablet, Take by mouth., Disp: , Rfl:  .  magnesium oxide (MAG-OX) 400 MG tablet, Take by mouth., Disp: , Rfl:  .  magnesium oxide (MAGNESIUM-OXIDE) 400 (241.3 Mg) MG tablet, Take 1 tablet (400 mg total) by mouth 2 (two) times daily., Disp: 60 tablet, Rfl: 5 .  meloxicam (MOBIC) 7.5 MG tablet, Take by mouth., Disp: , Rfl:  .  Microlet Lancets MISC, USE TO CHECK BLOOD SUGAR ONCE D, Disp: , Rfl:  .  promethazine (PHENERGAN) 12.5 MG tablet, Take 1 tablet (12.5 mg total) by mouth every 6 (six) hours as needed for nausea or vomiting., Disp: 12 tablet, Rfl: 0 .  Semaglutide,0.25 or 0.5MG/DOS, (OZEMPIC, 0.25 OR 0.5 MG/DOSE,) 2 MG/1.5ML SOPN, Inject 0.5 mg into the muscle every Monday., Disp: 9 mL, Rfl: 1 .  temazepam (RESTORIL) 15 MG capsule, if needed, Disp: , Rfl:  .  traMADol (ULTRAM) 50 MG tablet, 1-2 tablets every 8 hours as needed for pain, Disp: , Rfl:  .  traZODone (DESYREL) 50 MG tablet, Take 0.5-1 tablets (25-50 mg total)  by mouth at bedtime as needed for sleep., Disp: 30 tablet, Rfl: 2 Medications Discontinued During This Encounter  Medication Reason  . omeprazole (PRILOSEC) 20 MG capsule Discontinued by provider    I have personally reviewed and addressed the Medicare Annual Wellness health risk assessment questionnaire and have noted the following in the patient's chart:  A.         Medical and social history & family history B.         Use of alcohol, tobacco, and illicit drugs  C.         Current medications and supplements D.         Functional and Cognitive ability and status E.         Nutritional status F.         Physical activity G.        Advance directives H.         List of other physicians I.          Hospitalizations, surgeries, and ER visits in previous 12 months J.         [place] such as hearing, vision, cognitive function, and depression L.         Referrals and appointments: bone density   In addition, I have reviewed and discussed with patient certain preventive protocols, quality metrics, and best practice recommendations. A written personalized care plan for preventive services as well as general preventive health recommendations were provided to patient.   See attached scanned questionnaire for additional information.

## 2021-01-15 ENCOUNTER — Other Ambulatory Visit: Payer: Self-pay

## 2021-01-15 ENCOUNTER — Encounter: Payer: Self-pay | Admitting: Family Medicine

## 2021-01-15 ENCOUNTER — Other Ambulatory Visit: Payer: Self-pay | Admitting: Family Medicine

## 2021-01-15 ENCOUNTER — Ambulatory Visit (INDEPENDENT_AMBULATORY_CARE_PROVIDER_SITE_OTHER): Payer: Medicare (Managed Care) | Admitting: Family Medicine

## 2021-01-15 VITALS — BP 122/62 | HR 76 | Temp 98.5°F | Resp 16 | Ht 63.0 in | Wt 145.5 lb

## 2021-01-15 DIAGNOSIS — Z0000 Encounter for general adult medical examination without abnormal findings: Secondary | ICD-10-CM | POA: Diagnosis not present

## 2021-01-15 DIAGNOSIS — E785 Hyperlipidemia, unspecified: Secondary | ICD-10-CM

## 2021-01-15 DIAGNOSIS — N183 Chronic kidney disease, stage 3 unspecified: Secondary | ICD-10-CM

## 2021-01-15 DIAGNOSIS — K219 Gastro-esophageal reflux disease without esophagitis: Secondary | ICD-10-CM

## 2021-01-15 DIAGNOSIS — E1142 Type 2 diabetes mellitus with diabetic polyneuropathy: Secondary | ICD-10-CM

## 2021-01-15 DIAGNOSIS — I700 Atherosclerosis of aorta: Secondary | ICD-10-CM

## 2021-01-15 DIAGNOSIS — E1122 Type 2 diabetes mellitus with diabetic chronic kidney disease: Secondary | ICD-10-CM

## 2021-01-15 MED ORDER — OZEMPIC (0.25 OR 0.5 MG/DOSE) 2 MG/1.5ML ~~LOC~~ SOPN: 0.5000 mg | PEN_INJECTOR | SUBCUTANEOUS | 1 refills | Status: DC

## 2021-01-15 MED ORDER — FAMOTIDINE 20 MG PO TABS: 20.0000 mg | ORAL_TABLET | Freq: Two times a day (BID) | ORAL | 0 refills | Status: DC

## 2021-01-15 MED ORDER — ATORVASTATIN CALCIUM 20 MG PO TABS: 20.0000 mg | ORAL_TABLET | Freq: Every day | ORAL | 1 refills | Status: DC

## 2021-01-15 NOTE — Telephone Encounter (Signed)
Requested medication (s) are due for refill today - yes  Requested medication (s) are on the active medication list -yes  Future visit scheduled -yes  Last refill: 1 year- Rx expired  Notes to clinic: Original Rx as print- unable to change to normal for electronic Rx- please review and change  Requested Prescriptions  Pending Prescriptions Disp Refills   atorvastatin (LIPITOR) 20 MG tablet 90 tablet 1    Sig: Take 1 tablet (20 mg total) by mouth daily.      Cardiovascular:  Antilipid - Statins Failed - 01/15/2021 12:32 PM      Failed - Total Cholesterol in normal range and within 360 days    Cholesterol, Total  Date Value Ref Range Status  04/21/2016 160 100 - 199 mg/dL Final   Cholesterol  Date Value Ref Range Status  11/13/2020 216 (H) <200 mg/dL Final  41/74/0814 481 0 - 200 mg/dL Final          Failed - LDL in normal range and within 360 days    Ldl Cholesterol, Calc  Date Value Ref Range Status  07/08/2012 75 0 - 100 mg/dL Final   LDL Cholesterol (Calc)  Date Value Ref Range Status  11/13/2020 117 (H) mg/dL (calc) Final    Comment:    Reference range: <100 . Desirable range <100 mg/dL for primary prevention;   <70 mg/dL for patients with CHD or diabetic patients  with > or = 2 CHD risk factors. Marland Kitchen LDL-C is now calculated using the Martin-Hopkins  calculation, which is a validated novel method providing  better accuracy than the Friedewald equation in the  estimation of LDL-C.  Horald Pollen et al. Lenox Ahr. 8563;149(70): 2061-2068  (http://education.[facility].com/faq/FAQ164)           Passed - HDL in normal range and within 360 days    HDL Cholesterol  Date Value Ref Range Status  07/08/2012 46 40 - 60 mg/dL Final   HDL  Date Value Ref Range Status  11/13/2020 85 > OR = 50 mg/dL Final  26/37/8588 55 >50 mg/dL Final          Passed - Triglycerides in normal range and within 360 days    Triglycerides  Date Value Ref Range Status  11/13/2020 45 <150  mg/dL Final  27/74/1287 867 0 - 200 mg/dL Final          Passed - Patient is not pregnant      Passed - Valid encounter within last 12 months    Recent Outpatient Visits           Today Welcome to Medicare preventive visit   [facility] [place], Danna Hefty, MD   2 weeks ago Low magnesium level   [facility]ba Cory, MD   2 months ago Type 2 diabetes mellitus with stage 3 chronic kidney disease, without long-term current use of insulin, unspecified whether stage 3a or 3b CKD [facility])   [facility] [place], Danna Hefty, MD   1 year ago Diabetic polyneuropathy associated with type 2 diabetes mellitus [facility])   [facility] Alba Cory, MD   1 year ago Tension headache   [facility] Alba Cory, MD       Future Appointments             In 4 months Carlynn Purl, Danna Hefty, MD [facility]  Requested Prescriptions  Pending Prescriptions Disp Refills   atorvastatin (LIPITOR) 20 MG tablet 90 tablet 1    Sig: Take 1 tablet (20 mg total) by mouth daily.      Cardiovascular:  Antilipid - Statins Failed - 01/15/2021 12:32 PM      Failed - Total Cholesterol in normal range and within 360 days    Cholesterol, Total  Date Value Ref Range Status  04/21/2016 160 100 - 199 mg/dL Final   Cholesterol  Date Value Ref Range Status  11/13/2020 216 (H) <200 mg/dL Final  66/05/3015 010 0 - 200 mg/dL Final          Failed - LDL in normal range and within 360 days    Ldl Cholesterol, Calc  Date Value Ref Range Status  07/08/2012 75 0 - 100 mg/dL Final   LDL Cholesterol (Calc)  Date Value Ref Range Status  11/13/2020 117 (H) mg/dL (calc) Final    Comment:    Reference range: <100 . Desirable range <100 mg/dL for primary prevention;   <70 mg/dL for patients with CHD or diabetic patients  with > or = 2 CHD risk factors. Marland Kitchen LDL-C is now calculated  using the Martin-Hopkins  calculation, which is a validated novel method providing  better accuracy than the Friedewald equation in the  estimation of LDL-C.  Horald Pollen et al. Lenox Ahr. 9323;557(32): 2061-2068  (http://education.[facility].com/faq/FAQ164)           Passed - HDL in normal range and within 360 days    HDL Cholesterol  Date Value Ref Range Status  07/08/2012 46 40 - 60 mg/dL Final   HDL  Date Value Ref Range Status  11/13/2020 85 > OR = 50 mg/dL Final  20/25/4270 55 >62 mg/dL Final          Passed - Triglycerides in normal range and within 360 days    Triglycerides  Date Value Ref Range Status  11/13/2020 45 <150 mg/dL Final  37/62/8315 176 0 - 200 mg/dL Final          Passed - Patient is not pregnant      Passed - Valid encounter within last 12 months    Recent Outpatient Visits           Today Welcome to Medicare preventive visit   [facility] [place], Danna Hefty, MD   2 weeks ago Low magnesium level   [facility] Cory, MD   2 months ago Type 2 diabetes mellitus with stage 3 chronic kidney disease, without long-term current use of insulin, unspecified whether stage 3a or 3b CKD [facility])   [facility] [place], Danna Hefty, MD   1 year ago Diabetic polyneuropathy associated with type 2 diabetes mellitus [facility])   [facility] Alba Cory, MD   1 year ago Tension headache   [facility] Alba Cory, MD       Future Appointments             In 4 months Carlynn Purl, Danna Hefty, MD [facility], [facility]

## 2021-01-15 NOTE — Telephone Encounter (Signed)
Requested medication (s) are due for refill today:   Prescribed this morning by Dr. Carlynn Purl  Requested medication (s) are on the active medication list:   Yes  Future visit scheduled:   Seen this morning   Last ordered: Pharmacy requesting a 90 day supply instead of a 30 day.     Requested Prescriptions  Pending Prescriptions Disp Refills   famotidine (PEPCID) 20 MG tablet [Pharmacy Med Name: FAMOTIDINE 20MG  TABLETS] 180 tablet     Sig: TAKE 1 TABLET(20 MG) BY MOUTH TWICE DAILY      Gastroenterology:  H2 Antagonists Passed - 01/15/2021  1:54 PM      Passed - Valid encounter within last 12 months    Recent Outpatient Visits           Today Welcome to Medicare preventive visit   [facility] [place], Leugnies, MD   2 weeks ago Low magnesium level   [facility], MD   2 months ago Type 2 diabetes mellitus with stage 3 chronic kidney disease, without long-term current use of insulin, unspecified whether stage 3a or 3b CKD [facility])   [facility] [place], Leugnies, MD   1 year ago Diabetic polyneuropathy associated with type 2 diabetes mellitus [facility])   [facility], MD   1 year ago Tension headache   [facility], MD       Future Appointments             In 4 months Alba Cory, Carlynn Purl, MD [facility]

## 2021-01-15 NOTE — Telephone Encounter (Signed)
Medication Refill - Medication: atorvastatin (LIPITOR) 20 MG tablet    Preferred Pharmacy (with phone number or street name):  [id number] - [place], [place] - [address] Phone:  [phone]  Fax:  [phone]       Agent: Please be advised that RX refills may take up to 3 business days. We ask that you follow-up with your pharmacy.  

## 2021-01-29 ENCOUNTER — Other Ambulatory Visit: Payer: Self-pay

## 2021-01-29 ENCOUNTER — Ambulatory Visit
Admission: RE | Admit: 2021-01-29 | Discharge: 2021-01-29 | Disposition: A | Discharge status: Home / Self Care | Payer: Medicare (Managed Care) | Source: Ambulatory Visit | Attending: Family Medicine | Admitting: Family Medicine

## 2021-01-29 ENCOUNTER — Inpatient Hospital Stay
Admission: RE | Admit: 2021-01-29 | Discharge: 2021-01-29 | Disposition: A | Discharge status: Home / Self Care | Payer: Self-pay | Source: Ambulatory Visit | Attending: *Deleted | Admitting: *Deleted

## 2021-01-29 ENCOUNTER — Other Ambulatory Visit: Payer: Self-pay | Admitting: *Deleted

## 2021-01-29 DIAGNOSIS — Z1231 Encounter for screening mammogram for malignant neoplasm of breast: Secondary | ICD-10-CM

## 2021-01-29 DIAGNOSIS — Z13820 Encounter for screening for osteoporosis: Secondary | ICD-10-CM | POA: Insufficient documentation

## 2021-01-29 DIAGNOSIS — E119 Type 2 diabetes mellitus without complications: Secondary | ICD-10-CM | POA: Diagnosis not present

## 2021-01-29 DIAGNOSIS — M85851 Other specified disorders of bone density and structure, right thigh: Secondary | ICD-10-CM | POA: Insufficient documentation

## 2021-01-29 DIAGNOSIS — Z780 Asymptomatic menopausal state: Secondary | ICD-10-CM | POA: Diagnosis not present

## 2021-01-29 DIAGNOSIS — Z0000 Encounter for general adult medical examination without abnormal findings: Secondary | ICD-10-CM

## 2021-02-13 ENCOUNTER — Other Ambulatory Visit: Payer: Self-pay | Admitting: Family Medicine

## 2021-02-13 DIAGNOSIS — K219 Gastro-esophageal reflux disease without esophagitis: Secondary | ICD-10-CM

## 2021-02-13 NOTE — Telephone Encounter (Signed)
   Notes to clinic:  requesting a 90 day refill   Requested Prescriptions  Pending Prescriptions Disp Refills   famotidine (PEPCID) 20 MG tablet [Pharmacy Med Name: FAMOTIDINE 20MG  TABLETS] 180 tablet     Sig: TAKE 1 TABLET(20 MG) BY MOUTH TWICE DAILY      Gastroenterology:  H2 Antagonists Passed - 02/13/2021  9:16 AM      Passed - Valid encounter within last 12 months    Recent Outpatient Visits           4 weeks ago Welcome to 02/15/2021 preventive visit   [facility] [place], Leugnies, MD   1 month ago Low magnesium level   [facility] [place], Leugnies, MD   3 months ago Type 2 diabetes mellitus with stage 3 chronic kidney disease, without long-term current use of insulin, unspecified whether stage 3a or 3b CKD [facility])   [facility] [place], Leugnies, MD   1 year ago Diabetic polyneuropathy associated with type 2 diabetes mellitus [facility])   [facility], MD   1 year ago Tension headache   [facility], MD       Future Appointments             In 3 months Alba Cory, Carlynn Purl, MD [facility]

## 2021-03-12 ENCOUNTER — Ambulatory Visit: Payer: Medicare Other | Admitting: Family Medicine

## 2021-04-11 ENCOUNTER — Other Ambulatory Visit: Payer: Self-pay | Admitting: Family Medicine

## 2021-04-11 DIAGNOSIS — E1142 Type 2 diabetes mellitus with diabetic polyneuropathy: Secondary | ICD-10-CM

## 2021-04-11 DIAGNOSIS — G8929 Other chronic pain: Secondary | ICD-10-CM

## 2021-04-24 ENCOUNTER — Other Ambulatory Visit: Payer: Self-pay | Admitting: Family Medicine

## 2021-04-24 DIAGNOSIS — G4709 Other insomnia: Secondary | ICD-10-CM

## 2021-05-16 ENCOUNTER — Other Ambulatory Visit: Payer: Self-pay

## 2021-05-16 ENCOUNTER — Ambulatory Visit (INDEPENDENT_AMBULATORY_CARE_PROVIDER_SITE_OTHER): Payer: Medicare (Managed Care) | Admitting: Family Medicine

## 2021-05-16 ENCOUNTER — Encounter: Payer: Self-pay | Admitting: Family Medicine

## 2021-05-16 VITALS — BP 120/64 | HR 64 | Temp 98.6°F | Resp 16 | Ht 63.0 in | Wt 145.0 lb

## 2021-05-16 DIAGNOSIS — N183 Chronic kidney disease, stage 3 unspecified: Secondary | ICD-10-CM

## 2021-05-16 DIAGNOSIS — K219 Gastro-esophageal reflux disease without esophagitis: Secondary | ICD-10-CM

## 2021-05-16 DIAGNOSIS — E785 Hyperlipidemia, unspecified: Secondary | ICD-10-CM

## 2021-05-16 DIAGNOSIS — Z9889 Other specified postprocedural states: Secondary | ICD-10-CM

## 2021-05-16 DIAGNOSIS — E1142 Type 2 diabetes mellitus with diabetic polyneuropathy: Secondary | ICD-10-CM | POA: Diagnosis not present

## 2021-05-16 DIAGNOSIS — D573 Sickle-cell trait: Secondary | ICD-10-CM

## 2021-05-16 DIAGNOSIS — L308 Other specified dermatitis: Secondary | ICD-10-CM

## 2021-05-16 DIAGNOSIS — E1122 Type 2 diabetes mellitus with diabetic chronic kidney disease: Secondary | ICD-10-CM

## 2021-05-16 DIAGNOSIS — G43009 Migraine without aura, not intractable, without status migrainosus: Secondary | ICD-10-CM

## 2021-05-16 DIAGNOSIS — R790 Abnormal level of blood mineral: Secondary | ICD-10-CM

## 2021-05-16 DIAGNOSIS — R21 Rash and other nonspecific skin eruption: Secondary | ICD-10-CM

## 2021-05-16 DIAGNOSIS — I700 Atherosclerosis of aorta: Secondary | ICD-10-CM

## 2021-05-16 DIAGNOSIS — G8929 Other chronic pain: Secondary | ICD-10-CM

## 2021-05-16 DIAGNOSIS — I10 Essential (primary) hypertension: Secondary | ICD-10-CM

## 2021-05-16 DIAGNOSIS — M5442 Lumbago with sciatica, left side: Secondary | ICD-10-CM

## 2021-05-16 DIAGNOSIS — M5441 Lumbago with sciatica, right side: Secondary | ICD-10-CM

## 2021-05-16 DIAGNOSIS — G4709 Other insomnia: Secondary | ICD-10-CM

## 2021-05-16 LAB — POCT GLYCOSYLATED HEMOGLOBIN (HGB A1C): Hemoglobin A1C: 5.6 % (ref 4.0–5.6)

## 2021-05-16 LAB — COMPLETE METABOLIC PANEL WITH GFR
AG Ratio: 1.3 (calc) (ref 1.0–2.5)
ALT: 12 U/L (ref 6–29)
AST: 20 U/L (ref 10–35)
Albumin: 4.2 g/dL (ref 3.6–5.1)
Alkaline phosphatase (APISO): 113 U/L (ref 37–153)
BUN/Creatinine Ratio: 12 (calc) (ref 6–22)
BUN: 15 mg/dL (ref 7–25)
CO2: 28 mmol/L (ref 20–32)
Calcium: 9.5 mg/dL (ref 8.6–10.4)
Chloride: 106 mmol/L (ref 98–110)
Creat: 1.21 mg/dL — ABNORMAL HIGH (ref 0.50–0.99)
GFR, Est African American: 56 mL/min/{1.73_m2} — ABNORMAL LOW (ref >=60)
GFR, Est Non African American: 48 mL/min/{1.73_m2} — ABNORMAL LOW (ref >=60)
Globulin: 3.3 g/dL (calc) (ref 1.9–3.7)
Glucose, Bld: 89 mg/dL (ref 65–99)
Potassium: 4.3 mmol/L (ref 3.5–5.3)
Sodium: 142 mmol/L (ref 135–146)
Total Bilirubin: 0.4 mg/dL (ref 0.2–1.2)
Total Protein: 7.5 g/dL (ref 6.1–8.1)

## 2021-05-16 LAB — LIPID PANEL
Cholesterol: 173 mg/dL (ref <200)
HDL: 65 mg/dL (ref >=50)
LDL Cholesterol (Calc): 93 mg/dL (calc)
Non-HDL Cholesterol (Calc): 108 mg/dL (calc) (ref <130)
Total CHOL/HDL Ratio: 2.7 (calc) (ref <5.0)
Triglycerides: 67 mg/dL (ref <150)

## 2021-05-16 MED ORDER — CYCLOBENZAPRINE HCL 5 MG PO TABS: 5.0000 mg | ORAL_TABLET | Freq: Three times a day (TID) | ORAL | 5 refills | Status: DC

## 2021-05-16 MED ORDER — OZEMPIC (0.25 OR 0.5 MG/DOSE) 2 MG/1.5ML ~~LOC~~ SOPN: 0.5000 mg | PEN_INJECTOR | SUBCUTANEOUS | 1 refills | Status: DC

## 2021-05-16 MED ORDER — DILTIAZEM HCL ER COATED BEADS 180 MG PO CP24
180.0000 mg | ORAL_CAPSULE | Freq: Every day | ORAL | 1 refills | Status: DC
Start: 2021-05-16 — End: 2021-08-16

## 2021-05-16 MED ORDER — GABAPENTIN 300 MG PO CAPS: 300.0000 mg | ORAL_CAPSULE | Freq: Two times a day (BID) | ORAL | 1 refills | Status: DC

## 2021-05-16 MED ORDER — TRIAMCINOLONE ACETONIDE 0.1 % EX CREA: 1.0000 "application " | TOPICAL_CREAM | Freq: Two times a day (BID) | CUTANEOUS | 0 refills | Status: DC

## 2021-05-16 MED ORDER — TRAZODONE HCL 50 MG PO TABS: 50.0000 mg | ORAL_TABLET | Freq: Every day | ORAL | 1 refills | Status: DC

## 2021-05-16 MED ORDER — FAMOTIDINE 20 MG PO TABS: 20.0000 mg | ORAL_TABLET | Freq: Two times a day (BID) | ORAL | 1 refills | Status: DC

## 2021-05-16 MED ORDER — MAGNESIUM OXIDE 400 MG PO TABS: 400.0000 mg | ORAL_TABLET | Freq: Two times a day (BID) | ORAL | 1 refills | Status: DC

## 2021-05-16 MED ORDER — ATORVASTATIN CALCIUM 20 MG PO TABS: 20.0000 mg | ORAL_TABLET | Freq: Every day | ORAL | 1 refills | Status: DC

## 2021-05-16 NOTE — Patient Instructions (Signed)
Ask pharmacist about shingrix  

## 2021-05-16 NOTE — Progress Notes (Signed)
Name: Brandy Johnston   MRN: [id number]    DOB: 1961/07/10   Date:05/16/2021       Progress Note  Subjective  Chief Complaint  Follow Up  HPI  DMII with renal manifestation, and neuropathy. She continues to have numbness and tingling onfinger tips and toes are better since she has been taking magnesium and gabapentin. She checks glucose occasionally , fasting is between 70's to 110 . Denies polyphagia, polydipsia or polyuria. She is is on  Ozempic 0.25 mg weekly. Last hgbA1C is down to 5.76% , she does not want to stop medication, she states medication curbs her appetite  Insomnia: She is on disability now but still not sleeping, discussed sleep hygiene, she states Trazodone has been working well for her   HTN: She is no longer taking ACE/ARB because she had angioedema, taking Cardizem and bp is at goal, denies chest pain, dizziness  or palpitation   Hyperlipidemia:she has not  been compliant with statin therapy lately.  We will recheck labs today  , goal is LDL below 70   GERD: she is now on prevacid bid , her  EGD is normal, she is still taking Ozempic, explained that Ozempic can cause weight loss and bloating. She states feeling better now, no heart burn or indigestion  Left shoulder surgery : work related injury in January 2019, had three shoulder surgeries since, and on disability since Nov 2021. She is doing okay, still has left shoulder pain but stable.   Low calcium and magnesium: she was seen by Endocrinologist but lost to follow up. Unchanged She has been taking supplements and seems to be working well   Malnutrition: she lost 20 lbs since last year, she had mammogram, EGD, colonoscopy done at [facility], negative breast biopsy, her weight is now stable. She is trying to cook more frequently.   Migraine/Tension headaches: she has episodes seldom, lately it has been radiating from nuchal area to her ears, pain can be aching or  sharp, associated with photophobia, but no  phonophobia. No nausea or vomiting. She takes prn Fioricet prn, sometimes just takes a nap and symptoms resolves She states last episode about 3 months ago   Atherosclerosis Aorta: taking aspirin and statins ,  Last LDL above goal , and we adjusted medication dose we will recheck it today, goal is below 70  Rash: she noticed a dry patch on left lower leg , that pills, no pruritis or redness, she has been applying vaseline and or lotion and it seems to help.   Patient Active Problem List   Diagnosis Date Noted  . Hyperlipidemia 01/14/2021  . Incomplete tear of left rotator cuff 07/03/2020  . Angioedema due to angiotensin converting enzyme inhibitor (ACE-I) 01/29/2020  . Chronic bilateral back pain 07/26/2017  . Bilateral shoulder pain 07/26/2017  . Coronary artery calcification 05/17/2017  . Heart palpitations 05/15/2017  . History of acute gastritis   . Leukocytosis 09/30/2016  . Atherosclerosis of abdominal aorta ([place]) 08/18/2016  . Type 2 diabetes mellitus with stage 3 chronic kidney disease, without long-term current use of insulin ([place]) 12/16/2015  . Diabetic neuropathy associated with type 2 diabetes mellitus ([place]) 09/18/2015  . Insomnia 09/18/2015  . Benign essential HTN 06/18/2015  . Chronic kidney disease (CKD), stage III (moderate) ([place]) 06/18/2015  . Diabetes mellitus with renal manifestation ([place]) 06/18/2015  . Abnormal serum level of alkaline phosphatase 06/18/2015  . Bilateral low back pain with left-sided sciatica 06/18/2015  . Obesity (BMI 30.0-34.9) 06/18/2015  .  Degenerative arthritis of hip 06/18/2015  . Sickle cell trait ([place]) 06/18/2015  . Dyslipidemia 05/27/2010  . Gastro-esophageal reflux disease without esophagitis 05/25/2008    Past Surgical History:  Procedure Laterality Date  . BREAST BIOPSY Right 12/28/2019   stereo [facility] stromal fibrosis  . CHOLECYSTECTOMY    . COLONOSCOPY    . ESOPHAGOGASTRODUODENOSCOPY (EGD) WITH PROPOFOL N/A 10/05/2016   Procedure:  ESOPHAGOGASTRODUODENOSCOPY (EGD) WITH PROPOFOL;  Surgeon: Lucilla Lame, MD;  Location: [facility];  Service: Endoscopy;  Laterality: N/A;  . FRACTURE SURGERY Left    cast and pins   . SHOULDER ARTHROSCOPY WITH ROTATOR CUFF REPAIR AND SUBACROMIAL DECOMPRESSION Left 12/07/2018   Procedure: left shoulder manipulation under anesthesia, left shoulder arthroscopic lysis of adhesions;  Surgeon: Lovell Sheehan, MD;  Location: [facility];  Service: Orthopedics;  Laterality: Left;  . SHOULDER CLOSED REDUCTION Left 12/07/2018   Procedure: CLOSED MANIPULATION SHOULDER;  Surgeon: Lovell Sheehan, MD;  Location: [facility];  Service: Orthopedics;  Laterality: Left;  . shoulder surgery  Left 06/10/2018   Dr. Marica Otter  . TUBAL LIGATION      Family History  Problem Relation Age of Onset  . Migraines Mother   . Diabetes Mother   . Cancer Mother        lung  . Arthritis Brother   . Breast cancer Maternal Aunt   . Cancer Maternal Uncle        Lung and Colon  . Cirrhosis Brother   . Breast cancer Cousin     Social History   Tobacco Use  . Smoking status: Never Smoker  . Smokeless tobacco: Never Used  Substance Use Topics  . Alcohol use: Yes    Alcohol/week: 0.0 standard drinks    Comment: rare     Current Outpatient Medications:  .  aspirin (ASPIRIN 81) 81 MG chewable tablet, Chew 1 tablet (81 mg total) by mouth daily., Disp: 30 tablet, Rfl: 0 .  Blood Glucose Monitoring Suppl (CONTOUR NEXT ONE) KIT, USE TO TEST BLOOD SUGAR ONCE D, Disp: , Rfl:  .  Butalbital-APAP-Caffeine 50-300-40 MG CAPS, Take 1 capsule by mouth every 4 (four) hours as needed., Disp: 40 capsule, Rfl: 0 .  Cholecalciferol (VITAMIN D) 2000 units CAPS, Take 1 capsule (2,000 Units total) by mouth daily., Disp: 30 capsule, Rfl: 0 .  glucose blood test strip, Use as instructed, Disp: 100 each, Rfl: 12 .  loratadine (CLARITIN) 10 MG tablet, Take by mouth., Disp: , Rfl:  .  magnesium oxide (MAG-OX) 400 MG tablet, Take 1 tablet  (400 mg total) by mouth 2 (two) times daily., Disp: 180 tablet, Rfl: 1 .  Microlet Lancets MISC, USE TO CHECK BLOOD SUGAR ONCE D, Disp: , Rfl:  .  promethazine (PHENERGAN) 12.5 MG tablet, Take 1 tablet (12.5 mg total) by mouth every 6 (six) hours as needed for nausea or vomiting., Disp: 12 tablet, Rfl: 0 .  traMADol (ULTRAM) 50 MG tablet, 1-2 tablets every 8 hours as needed for pain, Disp: , Rfl:  .  triamcinolone cream (KENALOG) 0.1 %, Apply 1 application topically 2 (two) times daily., Disp: 30 g, Rfl: 0 .  atorvastatin (LIPITOR) 20 MG tablet, Take 1 tablet (20 mg total) by mouth daily., Disp: 90 tablet, Rfl: 1 .  cyclobenzaprine (FLEXERIL) 5 MG tablet, Take 1 tablet (5 mg total) by mouth 3 (three) times daily., Disp: 90 tablet, Rfl: 5 .  diltiazem (CARDIZEM CD) 180 MG 24 hr capsule, Take 1 capsule (180 mg total) by  mouth daily., Disp: 90 capsule, Rfl: 1 .  famotidine (PEPCID) 20 MG tablet, Take 1 tablet (20 mg total) by mouth 2 (two) times daily., Disp: 180 tablet, Rfl: 1 .  gabapentin (NEURONTIN) 300 MG capsule, Take 1 capsule (300 mg total) by mouth 2 (two) times daily., Disp: 180 capsule, Rfl: 1 .  [START ON 05/19/2021] Semaglutide,0.25 or 0.5MG/DOS, (OZEMPIC, 0.25 OR 0.5 MG/DOSE,) 2 MG/1.5ML SOPN, Inject 0.5 mg into the muscle every Monday., Disp: 9 mL, Rfl: 1 .  traZODone (DESYREL) 50 MG tablet, Take 1 tablet (50 mg total) by mouth at bedtime., Disp: 90 tablet, Rfl: 1  Allergies  Allergen Reactions  . Ace Inhibitors Swelling    Angioedema   . Lisinopril Swelling    Face and neck swelling    I personally reviewed active problem list, medication list, allergies, family history, social history, health maintenance with the patient/caregiver today.   ROS  Constitutional: Negative for fever or weight change.  Respiratory: Negative for cough and shortness of breath.   Cardiovascular: Negative for chest pain or palpitations.  Gastrointestinal: Negative for abdominal pain, no bowel  changes.  Musculoskeletal: Negative for gait problem or joint swelling.  Skin: Negative for rash.  Neurological: Negative for dizziness or headache.  No other specific complaints in a complete review of systems (except as listed in HPI above).  Objective  Vitals:   05/16/21 0834  BP: 120/64  Pulse: 64  Resp: 16  Temp: 98.6 F (37 C)  TempSrc: Oral  SpO2: 99%  Weight: 145 lb (65.8 kg)  Height: 5' 3"  (1.6 m)    Body mass index is 25.69 kg/m.  Physical Exam  Constitutional: Patient appears well-developed and well-nourished.  No distress.  HEENT: head atraumatic, normocephalic, pupils equal and reactive to light,  neck supple Cardiovascular: Normal rate, regular rhythm and normal heart sounds.  No murmur heard. No BLE edema. Pulmonary/Chest: Effort normal and breath sounds normal. No respiratory distress. Abdominal: Soft.  There is no tenderness. Psychiatric: Patient has a normal mood and affect. behavior is normal. Judgment and thought content normal.  Recent Results (from the past 2160 hour(s))  POCT HgB A1C     Status: None   Collection Time: 05/16/21  8:49 AM  Result Value Ref Range   Hemoglobin A1C 5.6 4.0 - 5.6 %   HbA1c POC (<> result, manual entry)     HbA1c, POC (prediabetic range)     HbA1c, POC (controlled diabetic range)       PHQ2/9: Depression screen [facility] 2/9 05/16/2021 01/15/2021 12/31/2020 11/13/2020 08/11/2019  Decreased Interest 0 0 0 0 0  Down, Depressed, Hopeless 0 0 0 0 0  PHQ - 2 Score 0 0 0 0 0  Altered sleeping - - - 1 0  Tired, decreased energy - - - 0 0  Change in appetite - - - 1 0  Feeling bad or failure about yourself  - - - 0 0  Trouble concentrating - - - 0 0  Moving slowly or fidgety/restless - - - 0 0  Suicidal thoughts - - - 0 0  PHQ-9 Score - - - 2 0  Difficult doing work/chores - - - Not difficult at all -  Some recent data might be hidden    phq 9 is negative   Fall Risk: Fall Risk  05/16/2021 01/15/2021 12/31/2020 11/13/2020  08/11/2019  Falls in the past year? 1 1 1 1  0  Comment - - - - -  Number falls in past  yr: 0 0 0 0 0  Injury with Fall? 0 1 1 1  0  Comment - - - - -  Risk for fall due to : - - History of fall(s) - -  Follow up - - - - -      Functional Status Survey: Is the patient deaf or have difficulty hearing?: No Does the patient have difficulty seeing, even when wearing glasses/contacts?: No Does the patient have difficulty concentrating, remembering, or making decisions?: No Does the patient have difficulty walking or climbing stairs?: No Does the patient have difficulty dressing or bathing?: No Does the patient have difficulty doing errands alone such as visiting a doctor's office or shopping?: No    Assessment & Plan  1. Diabetic polyneuropathy associated with type 2 diabetes mellitus (HCC)  - POCT HgB A1C - Semaglutide,0.25 or 0.5MG/DOS, (OZEMPIC, 0.25 OR 0.5 MG/DOSE,) 2 MG/1.5ML SOPN; Inject 0.5 mg into the muscle every Monday.  Dispense: 9 mL; Refill: 1 - gabapentin (NEURONTIN) 300 MG capsule; Take 1 capsule (300 mg total) by mouth 2 (two) times daily.  Dispense: 180 capsule; Refill: 1  2. Other eczema   3. Sickle cell trait ([place])   4. Gastro-esophageal reflux disease without esophagitis  - COMPLETE METABOLIC PANEL WITH GFR - famotidine (PEPCID) 20 MG tablet; Take 1 tablet (20 mg total) by mouth 2 (two) times daily.  Dispense: 180 tablet; Refill: 1  5. Benign essential HTN  - diltiazem (CARDIZEM CD) 180 MG 24 hr capsule; Take 1 capsule (180 mg total) by mouth daily.  Dispense: 90 capsule; Refill: 1  6. Migraine without aura and without status migrainosus, not intractable   7. Atherosclerosis of abdominal aorta (HCC)  - Lipid panel - atorvastatin (LIPITOR) 20 MG tablet; Take 1 tablet (20 mg total) by mouth daily.  Dispense: 90 tablet; Refill: 1  8. History of shoulder surgery   9. Dyslipidemia  - Lipid panel - atorvastatin (LIPITOR) 20 MG tablet; Take 1 tablet (20  mg total) by mouth daily.  Dispense: 90 tablet; Refill: 1  10. Type 2 diabetes mellitus with stage 3 chronic kidney disease, without long-term current use of insulin, unspecified whether stage 3a or 3b CKD ([place])   11. Other insomnia  - traZODone (DESYREL) 50 MG tablet; Take 1 tablet (50 mg total) by mouth at bedtime.  Dispense: 90 tablet; Refill: 1  12. Chronic bilateral low back pain with bilateral sciatica  - gabapentin (NEURONTIN) 300 MG capsule; Take 1 capsule (300 mg total) by mouth 2 (two) times daily.  Dispense: 180 capsule; Refill: 1 - cyclobenzaprine (FLEXERIL) 5 MG tablet; Take 1 tablet (5 mg total) by mouth 3 (three) times daily.  Dispense: 90 tablet; Refill: 5  13. Rash in adult  - triamcinolone cream (KENALOG) 0.1 %; Apply 1 application topically 2 (two) times daily.  Dispense: 30 g; Refill: 0  14. Low magnesium level  - magnesium oxide (MAG-OX) 400 MG tablet; Take 1 tablet (400 mg total) by mouth 2 (two) times daily.  Dispense: 180 tablet; Refill: 1

## 2021-05-20 ENCOUNTER — Ambulatory Visit: Payer: Self-pay | Admitting: *Deleted

## 2021-05-20 ENCOUNTER — Telehealth: Payer: Self-pay

## 2021-05-20 ENCOUNTER — Other Ambulatory Visit: Payer: Self-pay | Admitting: Family Medicine

## 2021-05-20 MED ORDER — ROSUVASTATIN CALCIUM 20 MG PO TABS: 20.0000 mg | ORAL_TABLET | Freq: Every day | ORAL | 1 refills | Status: DC

## 2021-05-20 NOTE — Telephone Encounter (Signed)
Patient notified

## 2021-05-20 NOTE — Telephone Encounter (Signed)
Copied from CRM [id number]. Topic: General - Other >> May 20, 2021  8:54 AM Aretta Nip wrote: Pt has called back in in response to MyChart message as follows Dr Carlynn Purl reply to labs copied from MyChart  Lipid panel has improved, down from 117 to 93 but goal is below 70, we can change from atorvastatin to Rosuvastatin 20 mg and recheck in 6 months or double dose of atorvastatin. What do you prefer?  Patients wants reply sent that she wants Rosuvastatin 20 mg and to send to [facility][id number] Nicholes Rough, [place] - [address] AT [facility] [address] [place] [address] Phone: [phone] Fax: [phone]

## 2021-05-20 NOTE — Telephone Encounter (Signed)
Pt had shingles vaccine Friday. Reports yesterday noted rash to both arms. States took Benadryl "Eased up but back and worse today." States red, raised size of pea. Back of both arms from hands to shoulders. States dry, red "One may have fluid in like a blister but may be from scratching." Also reports increased fatigue and mild headache since Friday. Areas are itchy. Denies swelling, no facial or tongue swelling, no difficulty swallowing or fever.After hours call.  Assured pt NT would route to practice for PCPs review. Advised ED for worsening. Home care advise given for now. Pt verbalizes understanding.  Reason for Disposition . Mild localized rash  Answer Assessment - Initial Assessment Questions 1. APPEARANCE of RASH: "Describe the rash."      Red raised size of pea. 2. LOCATION: "Where is the rash located?"      BAck side of  both  arms 3. NUMBER: "How many spots are there?"      *"Too many to count" 4. SIZE: "How big are the spots?" (Inches, centimeters or compare to size of a coin)      Size of pea 5. ONSET: "When did the rash start?"      Yesterday 6. ITCHING: "Does the rash itch?" If Yes, ask: "How bad is the itch?"  (Scale 0-10; or none, mild, moderate, severe)    Moderate 7. PAIN: "Does the rash hurt?" If Yes, ask: "How bad is the pain?"  (Scale 0-10; or none, mild, moderate, severe)    - NONE (0): no pain    - MILD (1-3): doesn't interfere with normal activities     - MODERATE (4-7): interferes with normal activities or awakens from sleep     - SEVERE (8-10): excruciating pain, unable to do any normal activities     no 8. OTHER SYMPTOMS: "Do you have any other symptoms?" (e.g., fever)     Mild headache, increased fatigue  Protocols used: RASH OR REDNESS - LOCALIZED-A-AH

## 2021-05-21 ENCOUNTER — Ambulatory Visit: Payer: Self-pay | Admitting: *Deleted

## 2021-05-21 ENCOUNTER — Telehealth: Payer: Self-pay

## 2021-05-21 NOTE — Telephone Encounter (Signed)
Pt called in c/o having a red rash on the backs of both arms from her wrists to her shoulders that started day before yesterday.  They itch.  She does not have any new exposures to anything she is aware of.  She did get her first shingles shot last Friday in her left arm.   She said the injection site was slightly red and her arm was mildly sore but that was all.   She did c/o having a headache and feeling tired since getting the shingles shot.  Yesterday she had some nausea but none today.  She is taking diphenhydramine.   She is going to call her pharmacy to a refill because she only has 2 pills left.  She wanted me to ask Dr. Carlynn Purl if she had any recommendations what to take or do for the bumps on her arms.  I have sent a note to [facility] for Dr Carlynn Purl with pt's request.    Reason for Disposition . Shingles (Herpes zoster; Shingrix) vaccine reactions  Answer Assessment - Initial Assessment Questions 1. SYMPTOMS: "What is the main symptom?" (e.g., redness, swelling, pain)      Pt calling in had a shingles shot on Friday.   Day before yesterday I got bumps on my arm.   Yesterday both my arms wrists to shoulders are broke out on the back of my arms.  I'm tired and have a headache.    2. ONSET: "When was the vaccine (shot) given?" "How much later did the Left arm is where the shot of given on Friday for shingles.   The site of the shot is not red, swollen, painful or itching. begin?" (e.g., hours, days ago)       3. SEVERITY: "How bad is it?"      The bumps on my arms are red and itching.   4. FEVER: "Is there a fever?" If Yes, ask: "What is it, how was it measured, and when did it start?"      I'm not sure 5. IMMUNIZATIONS GIVEN: "What shots have you recently received?"     I had a shingles shot last Friday.   The first one.   6. PAST REACTIONS: "Have you reacted to immunizations before?" If Yes, ask: "What happened?"     No vaccine reactions in the past.    7. OTHER  SYMPTOMS: "Do you have any other symptoms?"     I have Benadryl that I've been taking.     No shortness of breath or chest pain  Protocols used: IMMUNIZATION REACTIONS-A-AH

## 2021-05-21 NOTE — Telephone Encounter (Signed)
Copied from CRM [id number]. Topic: General - Call Back - No Documentation >> May 21, 2021 11:27 AM Randol Kern wrote: Reason for CRM: Pt reported that she is having a reaction to her shingles shot, pt has a bumpy itchy rash that is spreading. She is seeking Rx and a call back from clinic, Declined appt offer.  [id number] [place], [place] - [address] AT [facility] [address] [place] [address] Phone: [phone] Fax: [phone]  [phone]

## 2021-08-16 ENCOUNTER — Other Ambulatory Visit: Payer: Self-pay | Admitting: Family Medicine

## 2021-08-16 DIAGNOSIS — I10 Essential (primary) hypertension: Secondary | ICD-10-CM

## 2021-10-22 ENCOUNTER — Ambulatory Visit: Payer: Medicare (Managed Care) | Admitting: Family Medicine

## 2021-11-07 ENCOUNTER — Other Ambulatory Visit: Payer: Self-pay | Admitting: Family Medicine

## 2021-11-07 DIAGNOSIS — K219 Gastro-esophageal reflux disease without esophagitis: Secondary | ICD-10-CM

## 2021-11-10 NOTE — Telephone Encounter (Signed)
Pt states she cannot come here for her care as of right now due to her insurance not being in network. Pt states she has an appt at [facility] later this month until she's able to get other insurance. Pt would like to know if this can still be called in? She is also going to talk to her pharmacist about getting this OTC. We do have samples here as well. Please advise

## 2021-11-10 NOTE — Telephone Encounter (Signed)
Pt was very nice and is just asking for [place] to see her again once she gets her insurance  back

## 2021-11-15 ENCOUNTER — Other Ambulatory Visit: Payer: Self-pay | Admitting: Family Medicine

## 2021-11-15 DIAGNOSIS — G4709 Other insomnia: Secondary | ICD-10-CM

## 2021-11-15 NOTE — Telephone Encounter (Signed)
Requested Prescriptions  Pending Prescriptions Disp Refills  . traZODone (DESYREL) 50 MG tablet [Pharmacy Med Name: TRAZODONE 50MG  TABLETS] 90 tablet 0    Sig: TAKE 1 TABLET(50 MG) BY MOUTH AT BEDTIME     Psychiatry: Antidepressants - Serotonin Modulator Failed - 11/15/2021  3:40 AM      Failed - Valid encounter within last 6 months    Recent Outpatient Visits          6 months ago Diabetic polyneuropathy associated with type 2 diabetes mellitus [facility])   [facility], MD   10 months ago [facility] [place] [place] preventive visit   [facility] [place], Leugn[place], MD   10 months ago Low magnesium level   [facility] [place], Leugnies, MD   1 year ago Type 2 diabetes mellitus with stage 3 chronic kidney disease, without long-term current use of insulin, unspecified whether stage 3a or 3b CKD [facility])   [facility] [place], Leugnies, MD   2 years ago Diabetic polyneuropathy associated with type 2 diabetes mellitus [facility])   [facility] [place], Leugnies, MD             . rosuvastatin (CRESTOR) 20 MG tablet [Pharmacy Med Name: ROSUVASTATIN 20MG  TABLETS] 90 tablet 1    Sig: TAKE 1 TABLET BY MOUTH DAILY IN PLACE OF ATORVASTATIN     Cardiovascular:  Antilipid - Statins Passed - 11/15/2021  3:40 AM      Passed - Total Cholesterol in normal range and within 360 days    Cholesterol, Total  Date Value Ref Range Status  04/21/2016 160 100 - 199 mg/dL Final   Cholesterol  Date Value Ref Range Status  05/16/2021 173 <200 mg/dL Final  04/23/2016 05/18/2021 0 - 200 mg/dL Final         Passed - LDL in normal range and within 360 days    Ldl Cholesterol, Calc  Date Value Ref Range Status  07/08/2012 75 0 - 100 mg/dL Final   LDL Cholesterol (Calc)  Date Value Ref Range Status  05/16/2021 93 mg/dL (calc) Final    Comment:    Reference range: <100 . Desirable range <100 mg/dL for primary prevention;    <70 mg/dL for patients with CHD or diabetic patients  with > or = 2 CHD risk factors. 09/08/2012 LDL-C is now calculated using the Martin-Hopkins  calculation, which is a validated novel method providing  better accuracy than the Friedewald equation in the  estimation of LDL-C.  05/18/2021 et al. Marland Kitchen. Horald Pollen): 2061-2068  (http://education.[employer].com/faq/FAQ164)          Passed - HDL in normal range and within 360 days    HDL Cholesterol  Date Value Ref Range Status  07/08/2012 46 40 - 60 mg/dL Final   HDL  Date Value Ref Range Status  05/16/2021 65 > OR = 50 mg/dL Final  09/08/2012 55 05/18/2021 mg/dL Final         Passed - Triglycerides in normal range and within 360 days    Triglycerides  Date Value Ref Range Status  05/16/2021 67 <150 mg/dL Final  >73 05/18/2021 0 - 200 mg/dL Final         Passed - Patient is not pregnant      Passed - Valid encounter within last 12 months    Recent Outpatient Visits          6 months ago Diabetic polyneuropathy associated with type 2 diabetes  mellitus [facility])   [facility] Steele Sizer, MD   10 months ago Welcome to [employer] preventive visit   [facility] [place], Drue Stager, MD   10 months ago Low magnesium level   [facility] [place], Drue Stager, MD   1 year ago Type 2 diabetes mellitus with stage 3 chronic kidney disease, without long-term current use of insulin, unspecified whether stage 3a or 3b CKD [facility])   [facility] Steele Sizer, MD   2 years ago Diabetic polyneuropathy associated with type 2 diabetes mellitus [facility])   [facility] Steele Sizer, MD

## 2021-11-19 ENCOUNTER — Ambulatory Visit: Payer: Medicare (Managed Care) | Admitting: Family Medicine

## 2022-01-19 ENCOUNTER — Other Ambulatory Visit: Payer: Self-pay | Admitting: Family Medicine

## 2022-01-19 DIAGNOSIS — Z1231 Encounter for screening mammogram for malignant neoplasm of breast: Secondary | ICD-10-CM

## 2022-02-02 LAB — HM DIABETES EYE EXAM

## 2022-02-05 ENCOUNTER — Other Ambulatory Visit: Payer: Self-pay | Admitting: Family Medicine

## 2022-02-05 DIAGNOSIS — I10 Essential (primary) hypertension: Secondary | ICD-10-CM

## 2022-02-05 NOTE — Telephone Encounter (Signed)
Pt states shes no longer coming here due to her insurance and is asking once she gets back on track with insurance can she come back here to see Dr Carlynn Purl?

## 2022-02-24 ENCOUNTER — Other Ambulatory Visit: Payer: Self-pay | Admitting: Family Medicine

## 2022-02-24 DIAGNOSIS — G4709 Other insomnia: Secondary | ICD-10-CM

## 2022-04-02 ENCOUNTER — Other Ambulatory Visit: Payer: Self-pay

## 2022-04-02 DIAGNOSIS — E1142 Type 2 diabetes mellitus with diabetic polyneuropathy: Secondary | ICD-10-CM

## 2022-04-03 ENCOUNTER — Ambulatory Visit
Admission: RE | Admit: 2022-04-03 | Discharge: 2022-04-03 | Disposition: A | Discharge status: Home / Self Care | Payer: Medicare (Managed Care) | Source: Ambulatory Visit | Attending: Family Medicine | Admitting: Family Medicine

## 2022-04-03 DIAGNOSIS — Z1231 Encounter for screening mammogram for malignant neoplasm of breast: Secondary | ICD-10-CM | POA: Diagnosis present

## 2022-05-14 ENCOUNTER — Other Ambulatory Visit: Payer: Self-pay | Admitting: Family Medicine

## 2022-08-04 ENCOUNTER — Other Ambulatory Visit: Payer: Self-pay

## 2022-08-04 ENCOUNTER — Encounter: Payer: Self-pay | Admitting: Emergency Medicine

## 2022-08-04 ENCOUNTER — Emergency Department: Payer: Medicare (Managed Care)

## 2022-08-04 DIAGNOSIS — N183 Chronic kidney disease, stage 3 unspecified: Secondary | ICD-10-CM | POA: Diagnosis not present

## 2022-08-04 DIAGNOSIS — J069 Acute upper respiratory infection, unspecified: Secondary | ICD-10-CM | POA: Diagnosis not present

## 2022-08-04 DIAGNOSIS — R531 Weakness: Secondary | ICD-10-CM | POA: Insufficient documentation

## 2022-08-04 DIAGNOSIS — I129 Hypertensive chronic kidney disease with stage 1 through stage 4 chronic kidney disease, or unspecified chronic kidney disease: Secondary | ICD-10-CM | POA: Insufficient documentation

## 2022-08-04 DIAGNOSIS — Z79899 Other long term (current) drug therapy: Secondary | ICD-10-CM | POA: Diagnosis not present

## 2022-08-04 DIAGNOSIS — Z794 Long term (current) use of insulin: Secondary | ICD-10-CM | POA: Diagnosis not present

## 2022-08-04 DIAGNOSIS — Z7982 Long term (current) use of aspirin: Secondary | ICD-10-CM | POA: Insufficient documentation

## 2022-08-04 DIAGNOSIS — J029 Acute pharyngitis, unspecified: Secondary | ICD-10-CM | POA: Diagnosis present

## 2022-08-04 DIAGNOSIS — Z20822 Contact with and (suspected) exposure to covid-19: Secondary | ICD-10-CM | POA: Insufficient documentation

## 2022-08-04 DIAGNOSIS — E1122 Type 2 diabetes mellitus with diabetic chronic kidney disease: Secondary | ICD-10-CM | POA: Diagnosis not present

## 2022-08-04 LAB — CBC
HCT: 38.4 % (ref 36.0–46.0)
Hemoglobin: 12.2 g/dL (ref 12.0–15.0)
MCH: 26.2 pg (ref 26.0–34.0)
MCHC: 31.8 g/dL (ref 30.0–36.0)
MCV: 82.4 fL (ref 80.0–100.0)
Platelets: 193 10*3/uL (ref 150–400)
RBC: 4.66 MIL/uL (ref 3.87–5.11)
RDW: 14.6 % (ref 11.5–15.5)
WBC: 9.7 10*3/uL (ref 4.0–10.5)
nRBC: 0 % (ref 0.0–0.2)

## 2022-08-04 LAB — RESP PANEL BY RT-PCR (FLU A&B, COVID) ARPGX2
Influenza A by PCR: NEGATIVE
Influenza B by PCR: NEGATIVE
SARS Coronavirus 2 by RT PCR: NEGATIVE

## 2022-08-04 LAB — BASIC METABOLIC PANEL
Anion gap: 6 (ref 5–15)
BUN: 16 mg/dL (ref 8–23)
CO2: 25 mmol/L (ref 22–32)
Calcium: 9 mg/dL (ref 8.9–10.3)
Chloride: 111 mmol/L (ref 98–111)
Creatinine, Ser: 1.18 mg/dL — ABNORMAL HIGH (ref 0.44–1.00)
GFR, Estimated: 52 mL/min — ABNORMAL LOW (ref >60)
Glucose, Bld: 95 mg/dL (ref 70–99)
Potassium: 3.5 mmol/L (ref 3.5–5.1)
Sodium: 142 mmol/L (ref 135–145)

## 2022-08-04 NOTE — ED Provider Triage Note (Signed)
Emergency Medicine Provider Triage Evaluation Note  Brandy Johnston, a 61 y.o. female  was evaluated in triage.  Pt complains of fever chills, and lightheadedness. She flew back in to town from [facility] today, after symptom onset. She notes a mild cough.   Review of Systems  Positive: FCS, cough Negative: NV  Physical Exam  There were no vitals taken for this visit. Gen:   Awake, no distress  NAD Resp:  Normal effort CTA MSK:   Moves extremities without difficulty  Other:    Medical Decision Making  Medically screening exam initiated at 6:00 PM.  Appropriate orders placed.  Brandy Johnston was informed that the remainder of the evaluation will be completed by another provider, this initial triage assessment does not replace that evaluation, and the importance of remaining in the ED until their evaluation is complete.  Patient with a history of pretension, CKD, diabetes, presents to the ED for sudden onset of weakness, body aches, chills and dizziness.  Patient has been in [facility] for the last several days.   Brandy Hoard, PA-C 08/04/22 1802

## 2022-08-04 NOTE — ED Triage Notes (Signed)
Patient to ED via [facility] from home for generalized weakness. Patient just got home from [facility] this AM. Pt c/o body aches, chills, dizziness.

## 2022-08-05 ENCOUNTER — Emergency Department
Admission: EM | Admit: 2022-08-05 | Discharge: 2022-08-05 | Disposition: A | Discharge status: Home / Self Care | Payer: Medicare (Managed Care) | Attending: Emergency Medicine | Admitting: Emergency Medicine

## 2022-08-05 DIAGNOSIS — J069 Acute upper respiratory infection, unspecified: Secondary | ICD-10-CM

## 2022-08-05 DIAGNOSIS — R531 Weakness: Secondary | ICD-10-CM

## 2022-08-05 LAB — URINALYSIS, ROUTINE W REFLEX MICROSCOPIC
Bilirubin Urine: NEGATIVE
Glucose, UA: NEGATIVE mg/dL
Hgb urine dipstick: NEGATIVE
Ketones, ur: NEGATIVE mg/dL
Leukocytes,Ua: NEGATIVE
Nitrite: NEGATIVE
Protein, ur: 30 mg/dL — AB
Specific Gravity, Urine: 1.01 (ref 1.005–1.030)
pH: 7 (ref 5.0–8.0)

## 2022-08-05 LAB — GROUP A STREP BY PCR: Group A Strep by PCR: NOT DETECTED

## 2022-08-05 MED ORDER — IBUPROFEN 800 MG PO TABS
800.0000 mg | ORAL_TABLET | Freq: Once | ORAL | Status: AC
  Administered 2022-08-05: 800 mg via ORAL
  Filled 2022-08-05: qty 1

## 2022-08-05 NOTE — Discharge Instructions (Signed)
You may alternate Tylenol 1000 mg every 6 hours as needed for pain, fever and Ibuprofen 800 mg every 6-8 hours as needed for pain, fever.  Please take Ibuprofen with food.  Do not take more than 4000 mg of Tylenol (acetaminophen) in a 24 hour period. ° °

## 2022-08-05 NOTE — ED Notes (Signed)
Pt diconnected from monitor to use restroom.  Pt noted to be steady on feet.  Urine sample obtained from pt at this time.  Pt ambulated back to bed and was reconnected to monitor.

## 2022-08-05 NOTE — ED Provider Notes (Signed)
[facility] Provider Note    Event Date/Time   First MD Initiated Contact with Patient 08/05/22 0045     (approximate)   History   Weakness   HPI  Brandy Johnston is a 61 y.o. female with history of hypertension, diabetes, hyperlipidemia, chronic kidney disease who presents to the emergency department with complaints of generalized bodyaches, sore throat, congestion, cough, fevers, chills.  No chest pain, shortness of breath, nausea, vomiting, diarrhea, dysuria or hematuria.  States she just got off of a flight back from [place].  No known sick contacts.  Patient states she is feeling weak lightheaded.   History provided by patient and family.    Past Medical History:  Diagnosis Date   Anemia    Chronic renal impairment, stage 3 (moderate) (HCC)    Diabetes mellitus without complication (HCC)    No longer on meds   GERD (gastroesophageal reflux disease)    Hyperlipidemia    Hypertension    Hypokalemia    Insomnia    Low calcium levels    Osteoarthrosis, hip    right hip   Paresthesia    Sickle cell anemia (HCC)    Sickle-cell trait (HCC)    Tension headache    1x/mo   Wears contact lenses     Past Surgical History:  Procedure Laterality Date   BREAST BIOPSY Right 12/28/2019   stereo [facility] stromal fibrosis   CHOLECYSTECTOMY     COLONOSCOPY     ESOPHAGOGASTRODUODENOSCOPY (EGD) WITH PROPOFOL N/A 10/05/2016   Procedure: ESOPHAGOGASTRODUODENOSCOPY (EGD) WITH PROPOFOL;  Surgeon: Lucilla Lame, MD;  Location: [facility];  Service: Endoscopy;  Laterality: N/A;   FRACTURE SURGERY Left    cast and pins    SHOULDER ARTHROSCOPY WITH ROTATOR CUFF REPAIR AND SUBACROMIAL DECOMPRESSION Left 12/07/2018   Procedure: left shoulder manipulation under anesthesia, left shoulder arthroscopic lysis of adhesions;  Surgeon: Lovell Sheehan, MD;  Location: [facility];  Service: Orthopedics;  Laterality: Left;   SHOULDER CLOSED REDUCTION Left 12/07/2018    Procedure: CLOSED MANIPULATION SHOULDER;  Surgeon: Lovell Sheehan, MD;  Location: [facility];  Service: Orthopedics;  Laterality: Left;   shoulder surgery  Left 06/10/2018   Dr. Marica Otter   TUBAL LIGATION      MEDICATIONS:  Prior to Admission medications   Medication Sig Start Date End Date Taking? Authorizing Provider  aspirin (ASPIRIN 81) 81 MG chewable tablet Chew 1 tablet (81 mg total) by mouth daily. 05/03/18   Steele Sizer, MD  Blood Glucose Monitoring Suppl (CONTOUR NEXT ONE) KIT USE TO TEST BLOOD SUGAR ONCE D 04/21/19   [provider]  Butalbital-APAP-Caffeine 50-300-40 MG CAPS Take 1 capsule by mouth every 4 (four) hours as needed. 04/18/19   Steele Sizer, MD  Cholecalciferol (VITAMIN D) 2000 units CAPS Take 1 capsule (2,000 Units total) by mouth daily. 04/20/17   Steele Sizer, MD  cyclobenzaprine (FLEXERIL) 5 MG tablet Take 1 tablet (5 mg total) by mouth 3 (three) times daily. 05/16/21   Steele Sizer, MD  diltiazem (CARDIZEM CD) 180 MG 24 hr capsule TAKE 1 CAPSULE(180 MG) BY MOUTH DAILY 08/16/21   Steele Sizer, MD  famotidine (PEPCID) 20 MG tablet Take 1 tablet (20 mg total) by mouth 2 (two) times daily. 05/16/21   Steele Sizer, MD  gabapentin (NEURONTIN) 300 MG capsule Take 1 capsule (300 mg total) by mouth 2 (two) times daily. 05/16/21   Steele Sizer, MD  glucose blood test strip Use as instructed 04/21/19  Steele Sizer, MD  loratadine (CLARITIN) 10 MG tablet Take by mouth. 01/30/20   [provider]  magnesium oxide (MAG-OX) 400 MG tablet Take 1 tablet (400 mg total) by mouth 2 (two) times daily. 05/16/21   Steele Sizer, MD  Microlet Lancets MISC USE TO CHECK BLOOD SUGAR ONCE D 04/21/19   [provider]  promethazine (PHENERGAN) 12.5 MG tablet Take 1 tablet (12.5 mg total) by mouth every 6 (six) hours as needed for nausea or vomiting. 10/11/19   Steele Sizer, MD  rosuvastatin (CRESTOR) 20 MG tablet TAKE 1 TABLET BY MOUTH DAILY IN PLACE OF  ATORVASTATIN 11/15/21   [place]ow[place], Drue Stager, MD  Semaglutide,0.25 or 0.5MG/DOS, (OZEMPIC, 0.25 OR 0.5 MG/DOSE,) 2 MG/1.5ML SOPN Inject 0.5 mg into the muscle every Monday. 05/19/21   Steele Sizer, MD  traMADol Veatrice Bourbon) 50 MG tablet 1-2 tablets every 8 hours as needed for pain 06/12/20   [provider]  traZODone (DESYREL) 50 MG tablet TAKE 1 TABLET(50 MG) BY MOUTH AT BEDTIME 11/15/21   Steele Sizer, MD  triamcinolone cream (KENALOG) 0.1 % Apply 1 application topically 2 (two) times daily. 05/16/21   Steele Sizer, MD  lisinopril (ZESTRIL) 10 MG tablet Take 1 tablet (10 mg total) by mouth daily. 08/11/19 01/29/20  Steele Sizer, MD    Physical Exam   Triage Vital Signs: ED Triage Vitals  Enc Vitals Group     BP 08/04/22 1801 (!) 144/75     Pulse Rate 08/04/22 1801 78     Resp 08/04/22 1801 18     Temp 08/04/22 1801 98.9 F (37.2 C)     Temp Source 08/04/22 1801 Oral     SpO2 08/04/22 1801 97 %     Weight 08/04/22 1802 162 lb (73.5 kg)     Height 08/04/22 1802 5' 4"  (1.626 m)     Head Circumference --      Peak Flow --      Pain Score 08/04/22 1802 7     Pain Loc --      Pain Edu? --      Excl. in [place]? --     Most recent vital signs: Vitals:   08/05/22 0330 08/05/22 0433  BP: 117/68 117/66  Pulse: 65 64  Resp: (!) 24 15  Temp:  98.4 F (36.9 C)  SpO2: 98% 99%    CONSTITUTIONAL: Alert and oriented and responds appropriately to questions. Well-appearing; well-nourished HEAD: Normocephalic, atraumatic EYES: Conjunctivae clear, pupils appear equal, sclera nonicteric ENT: normal nose; moist mucous membranes; mild posterior pharyngeal erythema noted,, no tonsillar hypertrophy or exudate, no uvular deviation, no unilateral swelling in posterior oropharynx, no trismus or drooling, no muffled voice, normal phonation, no stridor, airway patent. NECK: Supple, normal ROM CARD: RRR; S1 and S2 appreciated; no murmurs, no clicks, no rubs, no gallops RESP: Normal chest excursion  without splinting or tachypnea; breath sounds clear and equal bilaterally; no wheezes, no rhonchi, no rales, no hypoxia or respiratory distress, speaking full sentences ABD/GI: Normal bowel sounds; non-distended; soft, non-tender, no rebound, no guarding, no peritoneal signs BACK: The back appears normal EXT: Normal ROM in all joints; no deformity noted, no edema; no cyanosis SKIN: Normal color for age and race; warm; no rash on exposed skin NEURO: Moves all extremities equally, normal speech PSYCH: The patient's mood and manner are appropriate.   ED Results / Procedures / Treatments   LABS: (all labs ordered are listed, but only abnormal results are displayed) Labs Reviewed  BASIC METABOLIC  PANEL - Abnormal; Notable for the following components:      Result Value   Creatinine, Ser 1.18 (*)    GFR, Estimated 52 (*)    All other components within normal limits  URINALYSIS, ROUTINE W REFLEX MICROSCOPIC - Abnormal; Notable for the following components:   Color, Urine YELLOW (*)    APPearance HAZY (*)    Protein, ur 30 (*)    Bacteria, UA RARE (*)    All other components within normal limits  RESP PANEL BY RT-PCR (FLU A&B, COVID) [id number]  GROUP A STREP BY PCR  CBC     EKG:  EKG Interpretation  Date/Time:  Tuesday August 04 2022 18:04:11 EDT Ventricular Rate:  75 PR Interval:  156 QRS Duration: 64 QT Interval:  390 QTC Calculation: 435 R Axis:   25 Text Interpretation: Normal sinus rhythm Cannot rule out Anterior infarct , age undetermined Abnormal ECG When compared with ECG of 26-Dec-2020 14:22, No significant change was found Confirmed by Pryor Curia [phone]) on 08/05/2022 12:42:05 AM         RADIOLOGY: My personal review and interpretation of imaging: Chest x-ray clear.  I have personally reviewed all radiology reports.   DG Chest 2 View  Result Date: 08/04/2022 CLINICAL DATA:  Cough with fever and chills as well as lightheadedness. EXAM: CHEST - 2 VIEW COMPARISON:   07/07/2012 FINDINGS: Lungs are adequately inflated without focal airspace consolidation or effusion. Cardiomediastinal silhouette is normal. Remainder of the exam is unchanged. IMPRESSION: No active cardiopulmonary disease. Electronically Signed   By: Marin Olp M.D.   On: 08/04/2022 18:34     PROCEDURES:  Critical Care performed: No    .1-3 Lead EKG Interpretation  Performed by: Aydenn Gervin, Delice Bison, DO Authorized by: Avryl Roehm, Delice Bison, DO     Interpretation: normal     ECG rate:  64   ECG rate assessment: normal     Rhythm: sinus rhythm     Ectopy: none     Conduction: normal       IMPRESSION / MDM / ASSESSMENT AND PLAN / ED COURSE  I reviewed the triage vital signs and the nursing notes.    Patient here with complaints of cough, congestion, sore throat, body aches, fever and chills.  The patient is on the cardiac monitor to evaluate for evidence of arrhythmia and/or significant heart rate changes.   DIFFERENTIAL DIAGNOSIS (includes but not limited to):   Viral URI, COVID-19, pneumonia, less likely ACS, PE, dissection   Patient's presentation is most consistent with acute presentation with potential threat to life or bodily function.   PLAN: Will obtain CBC, BMP, COVID and flu swabs, strep test, urinalysis.  She is drinking fluids here without difficulty.  Will obtain chest x-ray.  Will give ibuprofen for symptomatic relief.   MEDICATIONS GIVEN IN ED: Medications  ibuprofen (ADVIL) tablet 800 mg (800 mg Oral Given 08/05/22 0131)     ED COURSE: Patient is negative for strep, COVID and flu.  No leukocytosis.  Normal hemoglobin.  Normal electrolytes.  Mildly elevated kidney function which is chronic and stable for her.  Urine shows no sign of infection or dehydration.  Chest x-ray reviewed and interpreted by myself and the radiologist and shows no pneumonia.  Recommended supportive treatment at home, increase fluids, rest.  No sign of any bacterial infection that would need  antibiotics today.  Patient comfortable with this plan.  She does have a PCP to follow-up if needed.   At this  time, I do not feel there is any life-threatening condition present. I reviewed all nursing notes, vitals, pertinent previous records.  All lab and urine results, EKGs, imaging ordered have been independently reviewed and interpreted by myself.  I reviewed all available radiology reports from any imaging ordered this visit.  Based on my assessment, I feel the patient is safe to be discharged home without further emergent workup and can continue workup as an outpatient as needed. Discussed all findings, treatment plan as well as usual and customary return precautions.  They verbalize understanding and are comfortable with this plan.  Outpatient follow-up has been provided as needed.  All questions have been answered.    CONSULTS: No admission required at this time for viral URI without complications.   OUTSIDE RECORDS REVIEWED: Reviewed patient's last cardiology note on 07/27/2022.       FINAL CLINICAL IMPRESSION(S) / ED DIAGNOSES   Final diagnoses:  Viral URI  Generalized weakness     Rx / DC Orders   ED Discharge Orders     None        Note:  This document was prepared using Dragon voice recognition software and may include unintentional dictation errors.   Caleesi Kohl, Delice Bison, DO 08/05/22 [phone]

## 2022-08-26 ENCOUNTER — Other Ambulatory Visit: Payer: Self-pay | Admitting: Family Medicine

## 2022-08-26 DIAGNOSIS — G8929 Other chronic pain: Secondary | ICD-10-CM

## 2022-09-28 NOTE — Progress Notes (Signed)
Name: Brandy Johnston   MRN: [id number]    DOB: May 17, 1961   Date:09/29/2022       Progress Note  Subjective  Chief Complaint  Follow Up  HPI  DMII with renal manifestation, and neuropathy. She continues to have numbness and tingling on  finger tips and toes and is stable magnesium and gabapentin. She checks glucose occasionally, FSBS highest of 120 lowest in the 70 Denies polyphagia, polydipsia or polyuria. She is is on  Ozempic 0.25 mg weekly. A1C today was 6.1 %    Insomnia: She takes Trazodone prn, sleeping well most of the time    HTN: She is no longer taking ACE/ARB because she had angioedema, taking Cardizem and bp is at goal, denies chest pain, dizziness  or palpitation . We will contact pharmacy to find out the correct dose    Hyperlipidemia: she has been out of Crestor for the past few weeks, we will recheck labs in about 6-8 weeks .    GERD: she is taking Pepcid BID but has to take Tums prn when she eats greasy food. She used to take Omeprazole and states symptoms were better.    Left shoulder surgery : work related injury in January 2019, had three shoulder surgeries since, and on disability since Nov 2021.She is going to Ortho at [facility]    Low calcium and magnesium: she was seen by Endocrinologist but lost to follow up. We will recheck levels    Malnutrition: she had lost 20 lbs since last year, she had mammogram, EGD, colonoscopy done at [facility], negative breast biopsy, she has gained the weigh back since I saw her in 2022   Migraine/Tension headaches: she has episodes seldom, lately it has been radiating from nuchal area to her ears, pain can be aching or  sharp, associated with photophobia, but no phonophobia. No nausea or vomiting. She takes prn Fioricet prn, sometimes just takes a nap and symptoms resolves She states last episode a few days ago    Atherosclerosis Aorta: taking aspirin and statins ( but out for a few weeks now)  .   Patient Active Problem List   Diagnosis Date  Noted   Hyperlipidemia 01/14/2021   Incomplete tear of left rotator cuff 07/03/2020   Angioedema due to angiotensin converting enzyme inhibitor (ACE-I) 01/29/2020   Chronic bilateral back pain 07/26/2017   Coronary artery calcification 05/17/2017   Heart palpitations 05/15/2017   History of acute gastritis    Leukocytosis 09/30/2016   Atherosclerosis of abdominal aorta ([place]) 08/18/2016   Type 2 diabetes mellitus with stage 3 chronic kidney disease, without long-term current use of insulin ([place]) 12/16/2015   Diabetic neuropathy associated with type 2 diabetes mellitus ([place]) 09/18/2015   Insomnia 09/18/2015   Benign essential HTN 06/18/2015   Chronic kidney disease (CKD), stage III (moderate) ([place]) 06/18/2015   Diabetes mellitus with renal manifestation ([place]) 06/18/2015   Obesity (BMI 30.0-34.9) 06/18/2015   Degenerative arthritis of hip 06/18/2015   Sickle cell trait ([place]) 06/18/2015   Dyslipidemia 05/27/2010   Gastro-esophageal reflux disease without esophagitis 05/25/2008    Past Surgical History:  Procedure Laterality Date   BREAST BIOPSY Right 12/28/2019   stereo [facility] stromal fibrosis   CHOLECYSTECTOMY     COLONOSCOPY     ESOPHAGOGASTRODUODENOSCOPY (EGD) WITH PROPOFOL N/A 10/05/2016   Procedure: ESOPHAGOGASTRODUODENOSCOPY (EGD) WITH PROPOFOL;  Surgeon: Lucilla Lame, MD;  Location: [facility];  Service: Endoscopy;  Laterality: N/A;   FRACTURE SURGERY Left    cast and  pins    SHOULDER ARTHROSCOPY WITH ROTATOR CUFF REPAIR AND SUBACROMIAL DECOMPRESSION Left 12/07/2018   Procedure: left shoulder manipulation under anesthesia, left shoulder arthroscopic lysis of adhesions;  Surgeon: Lovell Sheehan, MD;  Location: [facility];  Service: Orthopedics;  Laterality: Left;   SHOULDER CLOSED REDUCTION Left 12/07/2018   Procedure: CLOSED MANIPULATION SHOULDER;  Surgeon: Lovell Sheehan, MD;  Location: [facility];  Service: Orthopedics;  Laterality: Left;   shoulder surgery  Left  06/10/2018   Dr. Marica Otter   TUBAL LIGATION      Family History  Problem Relation Age of Onset   Migraines Mother    Diabetes Mother    Cancer Mother        lung   Arthritis Brother    Breast cancer Maternal Aunt    Cancer Maternal Uncle        Lung and Colon   Cirrhosis Brother    Breast cancer Cousin     Social History   Tobacco Use   Smoking status: Never   Smokeless tobacco: Never  Substance Use Topics   Alcohol use: Yes    Alcohol/week: 0.0 standard drinks of alcohol    Comment: rare     Current Outpatient Medications:    aspirin (ASPIRIN 81) 81 MG chewable tablet, Chew 1 tablet (81 mg total) by mouth daily., Disp: 30 tablet, Rfl: 0   Blood Glucose Monitoring Suppl (CONTOUR NEXT ONE) KIT, USE TO TEST BLOOD SUGAR ONCE D, Disp: , Rfl:    Butalbital-APAP-Caffeine 50-300-40 MG CAPS, Take 1 capsule by mouth every 4 (four) hours as needed., Disp: 40 capsule, Rfl: 0   Cholecalciferol (VITAMIN D) 2000 units CAPS, Take 1 capsule (2,000 Units total) by mouth daily., Disp: 30 capsule, Rfl: 0   cyclobenzaprine (FLEXERIL) 5 MG tablet, Take 1 tablet (5 mg total) by mouth 3 (three) times daily., Disp: 90 tablet, Rfl: 5   diltiazem (CARDIZEM CD) 180 MG 24 hr capsule, TAKE 1 CAPSULE(180 MG) BY MOUTH DAILY, Disp: 90 capsule, Rfl: 0   famotidine (PEPCID) 20 MG tablet, Take 1 tablet (20 mg total) by mouth 2 (two) times daily., Disp: 180 tablet, Rfl: 1   gabapentin (NEURONTIN) 300 MG capsule, Take 1 capsule (300 mg total) by mouth 2 (two) times daily., Disp: 180 capsule, Rfl: 1   glucose blood test strip, Use as instructed, Disp: 100 each, Rfl: 12   loratadine (CLARITIN) 10 MG tablet, Take by mouth., Disp: , Rfl:    magnesium oxide (MAG-OX) 400 MG tablet, Take 1 tablet (400 mg total) by mouth 2 (two) times daily., Disp: 180 tablet, Rfl: 1   Microlet Lancets MISC, USE TO CHECK BLOOD SUGAR ONCE D, Disp: , Rfl:    promethazine (PHENERGAN) 12.5 MG tablet, Take 1 tablet (12.5 mg total) by mouth  every 6 (six) hours as needed for nausea or vomiting., Disp: 12 tablet, Rfl: 0   rosuvastatin (CRESTOR) 20 MG tablet, TAKE 1 TABLET BY MOUTH DAILY IN PLACE OF ATORVASTATIN, Disp: 90 tablet, Rfl: 1   Semaglutide,0.25 or 0.5MG/DOS, (OZEMPIC, 0.25 OR 0.5 MG/DOSE,) 2 MG/1.5ML SOPN, Inject 0.5 mg into the muscle every Monday., Disp: 9 mL, Rfl: 1   traZODone (DESYREL) 50 MG tablet, TAKE 1 TABLET(50 MG) BY MOUTH AT BEDTIME, Disp: 90 tablet, Rfl: 0   triamcinolone cream (KENALOG) 0.1 %, Apply 1 application topically 2 (two) times daily., Disp: 30 g, Rfl: 0   Allergies  Allergen Reactions   Ace Inhibitors Swelling    Angioedema  Lisinopril Swelling    Face and neck swelling    I personally reviewed active problem list, medication list, allergies, family history, social history, health maintenance with the patient/caregiver today.   ROS  Constitutional: Negative for fever or weight change.  Respiratory: Negative for cough and shortness of breath.   Cardiovascular: Negative for chest pain or palpitations.  Gastrointestinal: Negative for abdominal pain, no bowel changes.  Musculoskeletal: Negative for gait problem or joint swelling.  Skin: Negative for rash.  Neurological: Negative for dizziness or headache.  No other specific complaints in a complete review of systems (except as listed in HPI above).   Objective  Vitals:   09/29/22 0840  BP: 128/66  Pulse: 71  Resp: 16  SpO2: 97%  Weight: 164 lb (74.4 kg)  Height: _0  (1.626 m)    Body mass index is 28.15 kg/m.  Physical Exam  Constitutional: Patient appears well-developed and well-nourished.  No distress.  HEENT: head atraumatic, normocephalic, pupils equal and reactive to light, neck supple Cardiovascular: Normal rate, regular rhythm and normal heart sounds.  No murmur heard. No BLE edema. Pulmonary/Chest: Effort normal and breath sounds normal. No respiratory distress. Abdominal: Soft.  There is no  tenderness. Psychiatric: Patient has a normal mood and affect. behavior is normal. Judgment and thought content normal.   Recent Results (from the past 2160 hour(s))  Resp Panel by RT-PCR (Flu A&B, Covid) Anterior Nasal Swab     Status: None   Collection Time: 08/04/22  6:06 PM   Specimen: Anterior Nasal Swab  Result Value Ref Range   SARS Coronavirus 2 by RT PCR NEGATIVE NEGATIVE    Comment: (NOTE) SARS-CoV-2 target nucleic acids are NOT DETECTED.  The SARS-CoV-2 RNA is generally detectable in upper respiratory specimens during the acute phase of infection. The lowest concentration of SARS-CoV-2 viral copies this assay can detect is 138 copies/mL. A negative result does not preclude SARS-Cov-2 infection and should not be used as the sole basis for treatment or other patient management decisions. A negative result may occur with  improper specimen collection/handling, submission of specimen other than nasopharyngeal swab, presence of viral mutation(s) within the areas targeted by this assay, and inadequate number of viral copies(<138 copies/mL). A negative result must be combined with clinical observations, patient history, and epidemiological information. The expected result is Negative.  Fact Sheet for Patients:  EntrepreneurPulse.com.au  Fact Sheet for Healthcare Providers:  IncredibleEmployment.be  This test is no t yet approved or cleared by the [place] FDA and  has been authorized for detection and/or diagnosis of SARS-CoV-2 by FDA under an Emergency Use Authorization (EUA). This EUA will remain  in effect (meaning this test can be used) for the duration of the COVID-19 declaration under Section 564(b)(1) of the Act, 21 U.S.C.section 360bbb-3(b)(1), unless the authorization is terminated  or revoked sooner.       Influenza A by PCR NEGATIVE NEGATIVE   Influenza B by PCR NEGATIVE NEGATIVE    Comment: (NOTE) The Xpert Xpress  SARS-CoV-2/FLU/RSV plus assay is intended as an aid in the diagnosis of influenza from Nasopharyngeal swab specimens and should not be used as a sole basis for treatment. Nasal washings and aspirates are unacceptable for Xpert Xpress SARS-CoV-2/FLU/RSV testing.  Fact Sheet for Patients: EntrepreneurPulse.com.au  Fact Sheet for Healthcare Providers: IncredibleEmployment.be  This test is not yet approved or cleared by the [place] FDA and has been authorized for detection and/or diagnosis of SARS-CoV-2 by FDA under an Emergency Use Authorization (  EUA). This EUA will remain in effect (meaning this test can be used) for the duration of the COVID-19 declaration under Section 564(b)(1) of the Act, 21 U.S.C. section 360bbb-3(b)(1), unless the authorization is terminated or revoked.  Performed at [facility], [address] [place], [place] [address]   Basic metabolic panel     Status: Abnormal   Collection Time: 08/04/22  6:13 PM  Result Value Ref Range   Sodium 142 135 - 145 mmol/L   Potassium 3.5 3.5 - 5.1 mmol/L   Chloride 111 98 - 111 mmol/L   CO2 25 22 - 32 mmol/L   Glucose, Bld 95 70 - 99 mg/dL    Comment: Glucose reference range applies only to samples taken after fasting for at least 8 hours.   BUN 16 8 - 23 mg/dL   Creatinine, Ser 1.18 (H) 0.44 - 1.00 mg/dL   Calcium 9.0 8.9 - 10.3 mg/dL   GFR, Estimated 52 (L) >60 mL/min    Comment: (NOTE) Calculated using the CKD-EPI Creatinine Equation (2021)    Anion gap 6 5 - 15    Comment: Performed at [facility], [address] [place], [place] [address]  CBC     Status: None   Collection Time: 08/04/22  6:13 PM  Result Value Ref Range   WBC 9.7 4.0 - 10.5 K/uL   RBC 4.66 3.87 - 5.11 MIL/uL   Hemoglobin 12.2 12.0 - 15.0 g/dL   HCT 38.4 36.0 - 46.0 %   MCV 82.4 80.0 - 100.0 fL   MCH 26.2 26.0 - 34.0 pg   MCHC 31.8 30.0 - 36.0 g/dL   RDW 14.6 11.5 - 15.5 %    Platelets 193 150 - 400 K/uL   nRBC 0.0 0.0 - 0.2 %    Comment: Performed at [facility], [address] [place], [place] [address]  Urinalysis, Routine w reflex microscopic Urine, Clean Catch     Status: Abnormal   Collection Time: 08/05/22  1:30 AM  Result Value Ref Range   Color, Urine YELLOW (A) YELLOW   APPearance HAZY (A) CLEAR   Specific Gravity, Urine 1.010 1.005 - 1.030   pH 7.0 5.0 - 8.0   Glucose, UA NEGATIVE NEGATIVE mg/dL   Hgb urine dipstick NEGATIVE NEGATIVE   Bilirubin Urine NEGATIVE NEGATIVE   Ketones, ur NEGATIVE NEGATIVE mg/dL   Protein, ur 30 (A) NEGATIVE mg/dL   Nitrite NEGATIVE NEGATIVE   Leukocytes,Ua NEGATIVE NEGATIVE   RBC / HPF 0-5 0 - 5 RBC/hpf   WBC, UA 0-5 0 - 5 WBC/hpf   Bacteria, UA RARE (A) NONE SEEN   Squamous Epithelial / LPF 0-5 0 - 5   Mucus PRESENT     Comment: Performed at [facility], [place], [place] [address]  Group A Strep by PCR [facility] Only)     Status: None   Collection Time: 08/05/22  1:30 AM   Specimen: Throat; Sterile Swab  Result Value Ref Range   Group A Strep by PCR NOT DETECTED NOT DETECTED    Comment: Performed at [facility], [address] [place], [place] [address]  POCT HgB A1C     Status: Abnormal   Collection Time: 09/29/22  8:42 AM  Result Value Ref Range   Hemoglobin A1C 6.1 (A) 4.0 - 5.6 %   HbA1c POC (<> result, manual entry)     HbA1c, POC (prediabetic range)     HbA1c, POC (controlled diabetic  range)      Diabetic Foot Exam: Diabetic Foot Exam - Simple   Simple Foot Form Visual Inspection No deformities, no ulcerations, no other skin breakdown bilaterally: Yes Sensation Testing Intact to touch and monofilament testing bilaterally: Yes Pulse Check Posterior Tibialis and Dorsalis pulse intact bilaterally: Yes Comments      PHQ2/9:    09/29/2022    8:41 AM 05/16/2021    8:34 AM 01/15/2021   10:55 AM 12/31/2020    1:39 PM 11/13/2020    9:00 AM   Depression screen PHQ 2/9  Decreased Interest 0 0 0 0 0  Down, Depressed, Hopeless 0 0 0 0 0  PHQ - 2 Score 0 0 0 0 0  Altered sleeping 0    1  Tired, decreased energy 0    0  Change in appetite 0    1  Feeling bad or failure about yourself  0    0  Trouble concentrating 0    0  Moving slowly or fidgety/restless 0    0  Suicidal thoughts 0    0  PHQ-9 Score 0    2  Difficult doing work/chores     Not difficult at all    phq 9 is negative   Fall Risk:    09/29/2022    8:40 AM 05/16/2021    8:34 AM 01/15/2021   10:54 AM 12/31/2020    1:38 PM 11/13/2020    8:59 AM  Fall Risk   Falls in the past year? 0 _0 Number falls in past yr: 0 0 0 0 0  Injury with Fall? 0 0 _1 Risk for fall due to : No Fall Risks   History of fall(s)   Follow up Falls prevention discussed          Functional Status Survey: Is the patient deaf or have difficulty hearing?: No Does the patient have difficulty seeing, even when wearing glasses/contacts?: No Does the patient have difficulty concentrating, remembering, or making decisions?: No Does the patient have difficulty walking or climbing stairs?: No Does the patient have difficulty dressing or bathing?: No Does the patient have difficulty doing errands alone such as visiting a doctor's office or shopping?: No    Assessment & Plan  1. Diabetic polyneuropathy associated with type 2 diabetes mellitus (HCC)  - POCT HgB A1C - HM Diabetes Foot Exam - Urine Microalbumin w/creat. ratio  2. Need for immunization against influenza  - Flu Vaccine QUAD 6+ mos PF IM (Fluarix Quad PF)  3. Atherosclerosis of abdominal aorta (HCC)  - Lipid panel  4. Sickle cell trait (HCC)  - CBC with Differential/Platelet  5. Chronic bilateral low back pain with bilateral sciatica  Stable   6. Benign essential HTN  - CBC with Differential/Platelet - COMPLETE METABOLIC PANEL WITH GFR  7. Gastro-esophageal reflux disease without esophagitis   8.  Migraine without aura and without status migrainosus, not intractable   9. History of shoulder surgery   10. Stage 3a chronic kidney disease (HCC)  Recheck levels  11. Low magnesium level  - Magnesium

## 2022-09-29 ENCOUNTER — Ambulatory Visit (INDEPENDENT_AMBULATORY_CARE_PROVIDER_SITE_OTHER): Payer: Medicare HMO | Admitting: Family Medicine

## 2022-09-29 ENCOUNTER — Telehealth: Payer: Self-pay | Admitting: Family Medicine

## 2022-09-29 ENCOUNTER — Encounter: Payer: Self-pay | Admitting: Family Medicine

## 2022-09-29 ENCOUNTER — Other Ambulatory Visit: Payer: Self-pay

## 2022-09-29 VITALS — BP 128/66 | HR 71 | Resp 16 | Ht 64.0 in | Wt 164.0 lb

## 2022-09-29 DIAGNOSIS — I10 Essential (primary) hypertension: Secondary | ICD-10-CM

## 2022-09-29 DIAGNOSIS — E1142 Type 2 diabetes mellitus with diabetic polyneuropathy: Secondary | ICD-10-CM

## 2022-09-29 DIAGNOSIS — D573 Sickle-cell trait: Secondary | ICD-10-CM

## 2022-09-29 DIAGNOSIS — N183 Chronic kidney disease, stage 3 unspecified: Secondary | ICD-10-CM

## 2022-09-29 DIAGNOSIS — K219 Gastro-esophageal reflux disease without esophagitis: Secondary | ICD-10-CM

## 2022-09-29 DIAGNOSIS — G43009 Migraine without aura, not intractable, without status migrainosus: Secondary | ICD-10-CM | POA: Diagnosis not present

## 2022-09-29 DIAGNOSIS — E441 Mild protein-calorie malnutrition: Secondary | ICD-10-CM | POA: Insufficient documentation

## 2022-09-29 DIAGNOSIS — M5442 Lumbago with sciatica, left side: Secondary | ICD-10-CM | POA: Diagnosis not present

## 2022-09-29 DIAGNOSIS — I700 Atherosclerosis of aorta: Secondary | ICD-10-CM | POA: Diagnosis not present

## 2022-09-29 DIAGNOSIS — R790 Abnormal level of blood mineral: Secondary | ICD-10-CM

## 2022-09-29 DIAGNOSIS — Z9889 Other specified postprocedural states: Secondary | ICD-10-CM

## 2022-09-29 DIAGNOSIS — G8929 Other chronic pain: Secondary | ICD-10-CM

## 2022-09-29 DIAGNOSIS — N1831 Chronic kidney disease, stage 3a: Secondary | ICD-10-CM | POA: Diagnosis not present

## 2022-09-29 DIAGNOSIS — M5441 Lumbago with sciatica, right side: Secondary | ICD-10-CM

## 2022-09-29 DIAGNOSIS — Z23 Encounter for immunization: Secondary | ICD-10-CM

## 2022-09-29 LAB — POCT GLYCOSYLATED HEMOGLOBIN (HGB A1C): Hemoglobin A1C: 6.1 % — AB (ref 4.0–5.6)

## 2022-09-29 NOTE — Patient Instructions (Signed)
COVID booster  RSV

## 2022-09-29 NOTE — Telephone Encounter (Signed)
Referral placed. Darlene notified at [facility].

## 2022-09-29 NOTE — Telephone Encounter (Signed)
Caller states pt had a diabetic eye exam done but listed another pcp  Caller inquiring if a referral can be sent, to be in compliance w/ hippa guidelines  Please assist further  [facility] [address], [place], [place] [address] [phone] Fax [phone]

## 2022-09-30 LAB — MICROALBUMIN / CREATININE URINE RATIO
Creatinine, Urine: 134 mg/dL (ref 20–275)
Microalb Creat Ratio: 10 mcg/mg creat (ref <30)
Microalb, Ur: 1.3 mg/dL

## 2022-10-20 ENCOUNTER — Other Ambulatory Visit: Payer: Self-pay | Admitting: Family Medicine

## 2022-10-20 DIAGNOSIS — I10 Essential (primary) hypertension: Secondary | ICD-10-CM

## 2022-11-05 ENCOUNTER — Ambulatory Visit: Payer: Self-pay

## 2022-11-05 NOTE — Telephone Encounter (Signed)
  Chief Complaint: Shoulder pain Symptoms: pain Frequency: since Monday Pertinent Negatives: Patient denies fever, redness, swelling, numbness Disposition: [] ED /[] Urgent Care (no appt availability in office) / [x] Appointment(In office/virtual)/ []  [facility]/ [] Home Care/ [] Refused Recommended Disposition /[] [facility]/ []  Follow-up with PCP Additional Notes: Pt was picking something up on Monday. She heard a "pop" and left shoulder started hurting. Pt has had 3 sx on that arm. Pt is keeping arm in a sling which, for the most part, is keeping the pain under control. Pt states when she turns one way or another she has pain. PT only wants to see Dr. . Pt will seek immedicate care if needed.    Reason for Disposition  [1] MODERATE pain (e.g., interferes with normal activities) AND [2] present > 3 days  Answer Assessment - Initial Assessment Questions 1. ONSET: "When did the pain start?"     Monday - After picking something up and heard a pop 2. LOCATION: "Where is the pain located?"     Left 3. PAIN: "How bad is the pain?" (Scale 1-10; or mild, moderate, severe)   - MILD (1-3): doesn't interfere with normal activities   - MODERATE (4-7): interferes with normal activities (e.g., work or school) or awakens from sleep   - SEVERE (8-10): excruciating pain, unable to do any normal activities, unable to move arm at all due to pain     10/10 - when she moves it the wrong way 4. WORK OR EXERCISE: "Has there been any recent work or exercise that involved this part of the body?"     Picked something up on Monday 5. CAUSE: "What do you think is causing the shoulder pain?"     Unsure - 3 shoulders 6. OTHER SYMPTOMS: "Do you have any other symptoms?" (e.g., neck pain, swelling, rash, fever, numbness, weakness)     No - pain I neck a little 7. PREGNANCY: "Is there any chance you are pregnant?" "When was your last menstrual period?"  Protocols used: Shoulder Pain-A-AH

## 2022-11-06 NOTE — Progress Notes (Unsigned)
Name: Brandy Johnston   MRN: [id number]    DOB: [birth year]   Date:11/09/2022       Progress Note  Subjective  Chief Complaint  Shoulder Pain  HPI  Gastroenteritis: she woke up yesterday with nausea and vomiting , she could not keep fluids down and went to [facility] last night, reviewed labs, GFR dropped a little, she was given zofran, she had loose stools this am and is feeling tired, but no longer having nausea or vomiting. She also had elevation of white count and we will recheck labs today   Acute on chronic left shoulder pain: she has a long history of left shoulder problems due to work related injury back in 2019 and is on disability since Nov 2021. She picked up a box about 25 lbs in weight, that was stacked on top of another box and her shoulder gave away. It happened about 2 weeks ago but still bothersome. She has been hearing some popping (new symptom), it feels it catches, topical medication helps temporarily - but only if not moving. Pain is worse when internally rotating and also with abduction.   Patient Active Problem List   Diagnosis Date Noted   Hyperlipidemia 01/14/2021   Incomplete tear of left rotator cuff 07/03/2020   Angioedema due to angiotensin converting enzyme inhibitor (ACE-I) 01/29/2020   Chronic bilateral back pain 07/26/2017   Coronary artery calcification 05/17/2017   Heart palpitations 05/15/2017   History of acute gastritis    Leukocytosis 09/30/2016   Atherosclerosis of abdominal aorta ([place]) 08/18/2016   Type 2 diabetes mellitus with stage 3 chronic kidney disease, without long-term current use of insulin ([place]) 12/16/2015   Diabetic neuropathy associated with type 2 diabetes mellitus ([place]) 09/18/2015   Insomnia 09/18/2015   Benign essential HTN 06/18/2015   Chronic kidney disease (CKD), stage III (moderate) ([place]) 06/18/2015   Diabetes mellitus with renal manifestation ([place]) 06/18/2015   Obesity (BMI 30.0-34.9) 06/18/2015   Degenerative arthritis of hip  06/18/2015   Sickle cell trait ([place]) 06/18/2015   Dyslipidemia 05/27/2010   Gastro-esophageal reflux disease without esophagitis 05/25/2008    Past Surgical History:  Procedure Laterality Date   BREAST BIOPSY Right 12/28/2019   stereo [facility] stromal fibrosis   CHOLECYSTECTOMY     COLONOSCOPY     ESOPHAGOGASTRODUODENOSCOPY (EGD) WITH PROPOFOL N/A 10/05/2016   Procedure: ESOPHAGOGASTRODUODENOSCOPY (EGD) WITH PROPOFOL;  Surgeon: Lucilla Lame, MD;  Location: [facility];  Service: Endoscopy;  Laterality: N/A;   FRACTURE SURGERY Left    cast and pins    SHOULDER ARTHROSCOPY WITH ROTATOR CUFF REPAIR AND SUBACROMIAL DECOMPRESSION Left 12/07/2018   Procedure: left shoulder manipulation under anesthesia, left shoulder arthroscopic lysis of adhesions;  Surgeon: Lovell Sheehan, MD;  Location: [facility];  Service: Orthopedics;  Laterality: Left;   SHOULDER CLOSED REDUCTION Left 12/07/2018   Procedure: CLOSED MANIPULATION SHOULDER;  Surgeon: Lovell Sheehan, MD;  Location: [facility];  Service: Orthopedics;  Laterality: Left;   shoulder surgery  Left 06/10/2018   Dr. Marica Otter   TUBAL LIGATION      Family History  Problem Relation Age of Onset   Migraines Mother    Diabetes Mother    Cancer Mother        lung   Arthritis Brother    Breast cancer Maternal Aunt    Cancer Maternal Uncle        Lung and Colon   Cirrhosis Brother    Breast cancer Cousin     Social History  Tobacco Use   Smoking status: Never   Smokeless tobacco: Never  Substance Use Topics   Alcohol use: Yes    Alcohol/week: 0.0 standard drinks of alcohol    Comment: rare     Current Outpatient Medications:    aspirin (ASPIRIN 81) 81 MG chewable tablet, Chew 1 tablet (81 mg total) by mouth daily., Disp: 30 tablet, Rfl: 0   Blood Glucose Monitoring Suppl (CONTOUR NEXT ONE) KIT, USE TO TEST BLOOD SUGAR ONCE D, Disp: , Rfl:    Butalbital-APAP-Caffeine 50-300-40 MG CAPS, Take 1 capsule by mouth every 4 (four) hours as  needed., Disp: 40 capsule, Rfl: 0   Cholecalciferol (VITAMIN D) 2000 units CAPS, Take 1 capsule (2,000 Units total) by mouth daily., Disp: 30 capsule, Rfl: 0   cyclobenzaprine (FLEXERIL) 5 MG tablet, Take 1 tablet (5 mg total) by mouth 3 (three) times daily., Disp: 90 tablet, Rfl: 5   diltiazem (CARDIZEM CD) 180 MG 24 hr capsule, TAKE 1 CAPSULE(180 MG) BY MOUTH DAILY, Disp: 90 capsule, Rfl: 0   famotidine (PEPCID) 20 MG tablet, Take 1 tablet (20 mg total) by mouth 2 (two) times daily., Disp: 180 tablet, Rfl: 1   gabapentin (NEURONTIN) 300 MG capsule, Take 1 capsule (300 mg total) by mouth 2 (two) times daily., Disp: 180 capsule, Rfl: 1   glucose blood test strip, Use as instructed, Disp: 100 each, Rfl: 12   loratadine (CLARITIN) 10 MG tablet, Take by mouth., Disp: , Rfl:    magnesium oxide (MAG-OX) 400 MG tablet, Take 1 tablet (400 mg total) by mouth 2 (two) times daily., Disp: 180 tablet, Rfl: 1   Microlet Lancets MISC, USE TO CHECK BLOOD SUGAR ONCE D, Disp: , Rfl:    ondansetron (ZOFRAN-ODT) 4 MG disintegrating tablet, Take 1 tablet (4 mg total) by mouth every 8 (eight) hours as needed for nausea or vomiting., Disp: 20 tablet, Rfl: 0   promethazine (PHENERGAN) 12.5 MG tablet, Take 1 tablet (12.5 mg total) by mouth every 6 (six) hours as needed for nausea or vomiting., Disp: 12 tablet, Rfl: 0   rosuvastatin (CRESTOR) 20 MG tablet, TAKE 1 TABLET BY MOUTH DAILY IN PLACE OF ATORVASTATIN, Disp: 90 tablet, Rfl: 1   Semaglutide,0.25 or 0.5MG/DOS, (OZEMPIC, 0.25 OR 0.5 MG/DOSE,) 2 MG/1.5ML SOPN, Inject 0.5 mg into the muscle every Monday., Disp: 9 mL, Rfl: 1   traZODone (DESYREL) 50 MG tablet, TAKE 1 TABLET(50 MG) BY MOUTH AT BEDTIME, Disp: 90 tablet, Rfl: 0   triamcinolone cream (KENALOG) 0.1 %, Apply 1 application topically 2 (two) times daily., Disp: 30 g, Rfl: 0  Allergies  Allergen Reactions   Ace Inhibitors Swelling    Angioedema    Lisinopril Swelling    Face and neck swelling    I  personally reviewed active problem list, medication list, allergies, family history, social history, health maintenance with the patient/caregiver today.   ROS  Constitutional: Negative for fever or weight change.  Respiratory: Negative for cough and shortness of breath.   Cardiovascular: Negative for chest pain or palpitations.  Gastrointestinal: abdominal wall is sore from vomiting, positive for  bowel changes.  Musculoskeletal: Negative for gait problem or joint swelling.  Skin: Negative for rash.  Neurological: Negative for dizziness or headache.  No other specific complaints in a complete review of systems (except as listed in HPI above).   Objective  Vitals:   11/09/22 0814  BP: 126/72  Pulse: 93  Resp: 16  Weight: 161 lb (73 kg)  Height: 5'  4" (1.626 m)    Body mass index is 27.64 kg/m.  Physical Exam  Constitutional: Patient appears well-developed and well-nourished. O No distress.  HEENT: head atraumatic, normocephalic, pupils equal and reactive to light, neck supple Cardiovascular: Normal rate, regular rhythm and normal heart sounds.  No murmur heard. No BLE edema. Pulmonary/Chest: Effort normal and breath sounds normal. No respiratory distress. Abdominal: Soft.  There is no tenderness. Muscular Skeletal: decrease in internal rotation and abduction of left shoulder  GYN: normal external genitalia , normal cervix.  Psychiatric: Patient has a normal mood and affect. behavior is normal. Judgment and thought content normal.   Recent Results (from the past 2160 hour(s))  POCT HgB A1C     Status: Abnormal   Collection Time: 09/29/22  8:42 AM  Result Value Ref Range   Hemoglobin A1C 6.1 (A) 4.0 - 5.6 %   HbA1c POC (<> result, manual entry)     HbA1c, POC (prediabetic range)     HbA1c, POC (controlled diabetic range)    Urine Microalbumin w/creat. ratio     Status: None   Collection Time: 09/29/22 10:48 AM  Result Value Ref Range   Creatinine, Urine 134 20 - 275  mg/dL   Microalb, Ur 1.3 mg/dL    Comment: Reference Range Not established    Microalb Creat Ratio 10 <30 mcg/mg creat    Comment: . The ADA defines abnormalities in albumin excretion as follows: Marland Kitchen Albuminuria Category        Result (mcg/mg creatinine) . Normal to Mildly increased   <30 Moderately increased         30-299  Severely increased           > OR = 300 . The ADA recommends that at least two of three specimens collected within a 3-6 month period be abnormal before considering a patient to be within a diagnostic category.   Lipase, blood     Status: None   Collection Time: 11/08/22  9:16 PM  Result Value Ref Range   Lipase 30 11 - 51 U/L    Comment: Performed at [facility], [address] [place], [place] [address]  Comprehensive metabolic panel     Status: Abnormal   Collection Time: 11/08/22  9:16 PM  Result Value Ref Range   Sodium 139 135 - 145 mmol/L   Potassium 3.7 3.5 - 5.1 mmol/L   Chloride 107 98 - 111 mmol/L   CO2 23 22 - 32 mmol/L   Glucose, Bld 115 (H) 70 - 99 mg/dL    Comment: Glucose reference range applies only to samples taken after fasting for at least 8 hours.   BUN 16 8 - 23 mg/dL   Creatinine, Ser 1.23 (H) 0.44 - 1.00 mg/dL   Calcium 8.9 8.9 - 10.3 mg/dL   Total Protein 8.0 6.5 - 8.1 g/dL   Albumin 3.9 3.5 - 5.0 g/dL   AST 27 15 - 41 U/L   ALT 14 0 - 44 U/L   Alkaline Phosphatase 91 38 - 126 U/L   Total Bilirubin 0.8 0.3 - 1.2 mg/dL   GFR, Estimated 49 (L) >60 mL/min    Comment: (NOTE) Calculated using the CKD-EPI Creatinine Equation (2021)    Anion gap 9 5 - 15    Comment: Performed at [facility], [address], [place], [place] [address]  CBC     Status: Abnormal   Collection Time: 11/08/22  9:16 PM  Result Value Ref Range  WBC 11.2 (H) 4.0 - 10.5 K/uL   RBC 4.80 3.87 - 5.11 MIL/uL   Hemoglobin 12.6 12.0 - 15.0 g/dL   HCT 38.9 36.0 - 46.0 %   MCV 81.0 80.0 - 100.0 fL   MCH 26.3 26.0 - 34.0 pg   MCHC  32.4 30.0 - 36.0 g/dL   RDW 14.6 11.5 - 15.5 %   Platelets 249 150 - 400 K/uL   nRBC 0.0 0.0 - 0.2 %    Comment: Performed at [facility], [address] [place], [place] [address]    PHQ2/9:    11/09/2022    8:13 AM 09/29/2022    8:41 AM 05/16/2021    8:34 AM 01/15/2021   10:55 AM 12/31/2020    1:39 PM  Depression screen PHQ 2/9  Decreased Interest 0 0 0 0 0  Down, Depressed, Hopeless 0 0 0 0 0  PHQ - 2 Score 0 0 0 0 0  Altered sleeping 0 0     Tired, decreased energy 0 0     Change in appetite 0 0     Feeling bad or failure about yourself  0 0     Trouble concentrating 0 0     Moving slowly or fidgety/restless 0 0     Suicidal thoughts 0 0     PHQ-9 Score 0 0       phq 9 is negative   Fall Risk:    11/09/2022    8:13 AM 09/29/2022    8:40 AM 05/16/2021    8:34 AM 01/15/2021   10:54 AM 12/31/2020    1:38 PM  Fall Risk   Falls in the past year? 0 0 _0 Number falls in past yr: 0 0 0 0 0  Injury with Fall? 0 0 0 1 1  Risk for fall due to : No Fall Risks No Fall Risks   History of fall(s)  Follow up Falls prevention discussed Falls prevention discussed         Functional Status Survey: Is the patient deaf or have difficulty hearing?: No Does the patient have difficulty seeing, even when wearing glasses/contacts?: No Does the patient have difficulty concentrating, remembering, or making decisions?: No Does the patient have difficulty walking or climbing stairs?: No Does the patient have difficulty dressing or bathing?: No Does the patient have difficulty doing errands alone such as visiting a doctor's office or shopping?: No    Assessment & Plan  1. Chronic left shoulder pain  - Ambulatory referral to Orthopedic Surgery  2. Gastroenteritis  Feeling better, discussed bland diet   3. Cervical cancer screening  - Cytology - PAP

## 2022-11-08 ENCOUNTER — Emergency Department
Admission: EM | Admit: 2022-11-08 | Discharge: 2022-11-08 | Disposition: A | Discharge status: Home / Self Care | Payer: Medicare HMO | Attending: Emergency Medicine | Admitting: Emergency Medicine

## 2022-11-08 DIAGNOSIS — I129 Hypertensive chronic kidney disease with stage 1 through stage 4 chronic kidney disease, or unspecified chronic kidney disease: Secondary | ICD-10-CM | POA: Diagnosis not present

## 2022-11-08 DIAGNOSIS — N189 Chronic kidney disease, unspecified: Secondary | ICD-10-CM | POA: Diagnosis not present

## 2022-11-08 DIAGNOSIS — R112 Nausea with vomiting, unspecified: Secondary | ICD-10-CM | POA: Diagnosis present

## 2022-11-08 DIAGNOSIS — K529 Noninfective gastroenteritis and colitis, unspecified: Secondary | ICD-10-CM | POA: Diagnosis not present

## 2022-11-08 DIAGNOSIS — E1122 Type 2 diabetes mellitus with diabetic chronic kidney disease: Secondary | ICD-10-CM | POA: Insufficient documentation

## 2022-11-08 LAB — COMPREHENSIVE METABOLIC PANEL
ALT: 14 U/L (ref 0–44)
AST: 27 U/L (ref 15–41)
Albumin: 3.9 g/dL (ref 3.5–5.0)
Alkaline Phosphatase: 91 U/L (ref 38–126)
Anion gap: 9 (ref 5–15)
BUN: 16 mg/dL (ref 8–23)
CO2: 23 mmol/L (ref 22–32)
Calcium: 8.9 mg/dL (ref 8.9–10.3)
Chloride: 107 mmol/L (ref 98–111)
Creatinine, Ser: 1.23 mg/dL — ABNORMAL HIGH (ref 0.44–1.00)
GFR, Estimated: 49 mL/min — ABNORMAL LOW (ref >60)
Glucose, Bld: 115 mg/dL — ABNORMAL HIGH (ref 70–99)
Potassium: 3.7 mmol/L (ref 3.5–5.1)
Sodium: 139 mmol/L (ref 135–145)
Total Bilirubin: 0.8 mg/dL (ref 0.3–1.2)
Total Protein: 8 g/dL (ref 6.5–8.1)

## 2022-11-08 LAB — CBC
HCT: 38.9 % (ref 36.0–46.0)
Hemoglobin: 12.6 g/dL (ref 12.0–15.0)
MCH: 26.3 pg (ref 26.0–34.0)
MCHC: 32.4 g/dL (ref 30.0–36.0)
MCV: 81 fL (ref 80.0–100.0)
Platelets: 249 10*3/uL (ref 150–400)
RBC: 4.8 MIL/uL (ref 3.87–5.11)
RDW: 14.6 % (ref 11.5–15.5)
WBC: 11.2 10*3/uL — ABNORMAL HIGH (ref 4.0–10.5)
nRBC: 0 % (ref 0.0–0.2)

## 2022-11-08 LAB — LIPASE, BLOOD: Lipase: 30 U/L (ref 11–51)

## 2022-11-08 MED ORDER — ONDANSETRON 4 MG PO TBDP
ORAL_TABLET | ORAL | Status: AC
  Filled 2022-11-08: qty 2

## 2022-11-08 MED ORDER — ONDANSETRON 4 MG PO TBDP: 4.0000 mg | ORAL_TABLET | Freq: Three times a day (TID) | ORAL | 0 refills | Status: DC | PRN

## 2022-11-08 MED ORDER — ONDANSETRON 4 MG PO TBDP
8.0000 mg | ORAL_TABLET | Freq: Once | ORAL | Status: AC
  Administered 2022-11-08: 8 mg via ORAL
  Filled 2022-11-08: qty 2

## 2022-11-08 NOTE — ED Triage Notes (Signed)
Abd pain with N/V/D all day. Pt took OTD Zofran and it did not help.

## 2022-11-08 NOTE — ED Provider Notes (Signed)
[facility] Provider Note    Event Date/Time   First MD Initiated Contact with Patient 11/08/22 2231     (approximate)   History   Abdominal Pain   HPI  Brandy Johnston is a 61 y.o. female with a history of diabetes, CKD, hypertension who presents with complaints of nausea vomiting and diarrhea.  Patient reports that started this morning.  She reports she took a Zofran but immediately vomited up.  She reports abdominal cramping intermittently, no abdominal pain at this time.  No sick contacts reported.  No fevers.     Physical Exam   Triage Vital Signs: ED Triage Vitals [11/08/22 2113]  Enc Vitals Group     BP 132/83     Pulse Rate 100     Resp 18     Temp 98.7 F (37.1 C)     Temp Source Oral     SpO2 95 %     Weight 74.4 kg (164 lb)     Height      Head Circumference      Peak Flow      Pain Score 10     Pain Loc      Pain Edu?      Excl. in GC?     Most recent vital signs: Vitals:   11/08/22 2113  BP: 132/83  Pulse: 100  Resp: 18  Temp: 98.7 F (37.1 C)  SpO2: 95%     General: Awake, no distress.  CV:  Good peripheral perfusion.  Resp:  Normal effort.  Abd:  No distention.  Other:     ED Results / Procedures / Treatments   Labs (all labs ordered are listed, but only abnormal results are displayed) Labs Reviewed  COMPREHENSIVE METABOLIC PANEL - Abnormal; Notable for the following components:      Result Value   Glucose, Bld 115 (*)    Creatinine, Ser 1.23 (*)    GFR, Estimated 49 (*)    All other components within normal limits  CBC - Abnormal; Notable for the following components:   WBC 11.2 (*)    All other components within normal limits  LIPASE, BLOOD  URINALYSIS, ROUTINE W REFLEX MICROSCOPIC     EKG     RADIOLOGY     PROCEDURES:  Critical Care performed:   Procedures   MEDICATIONS ORDERED IN ED: Medications  ondansetron (ZOFRAN-ODT) disintegrating tablet 8 mg (8 mg Oral Given 11/08/22  2304)     IMPRESSION / MDM / ASSESSMENT AND PLAN / ED COURSE  I reviewed the triage vital signs and the nursing notes. Patient's presentation is most consistent with acute illness / injury with system symptoms.  Patient presents with nausea, vomiting, diarrhea.  Suspicious for viral gastroenteritis given prevalence in the community this time.  Overall well-appearing and in no acute distress, no abdominal tenderness to palpation.  Lab work reviewed and is reassuring, glucose is controlled, normal anion gap, mild elevation of white blood cell count is nonspecific.  Patient treated with ODT Zofran with significant improvement, offered IV fluids however she would prefer to go home, Zofran prescription provided        FINAL CLINICAL IMPRESSION(S) / ED DIAGNOSES   Final diagnoses:  Gastroenteritis     Rx / DC Orders   ED Discharge Orders          Ordered    ondansetron (ZOFRAN-ODT) 4 MG disintegrating tablet  Every 8 hours PRN  11/08/22 2331             Note:  This document was prepared using Dragon voice recognition software and may include unintentional dictation errors.   Jene Every, MD 11/08/22 [phone]

## 2022-11-08 NOTE — ED Notes (Signed)
Pt reports medication come out of her mouth and on to the floor whille talking to doctor. Pt given another dose of zofran 8 mg

## 2022-11-09 ENCOUNTER — Encounter: Payer: Self-pay | Admitting: Family Medicine

## 2022-11-09 ENCOUNTER — Ambulatory Visit (INDEPENDENT_AMBULATORY_CARE_PROVIDER_SITE_OTHER): Payer: Medicare HMO | Admitting: Family Medicine

## 2022-11-09 ENCOUNTER — Other Ambulatory Visit (HOSPITAL_COMMUNITY)
Admission: RE | Admit: 2022-11-09 | Discharge: 2022-11-09 | Disposition: A | Discharge status: Home / Self Care | Payer: Medicare HMO | Source: Ambulatory Visit | Attending: Family Medicine | Admitting: Family Medicine

## 2022-11-09 VITALS — BP 126/72 | HR 93 | Resp 16 | Ht 64.0 in | Wt 161.0 lb

## 2022-11-09 DIAGNOSIS — Z01419 Encounter for gynecological examination (general) (routine) without abnormal findings: Secondary | ICD-10-CM | POA: Insufficient documentation

## 2022-11-09 DIAGNOSIS — D573 Sickle-cell trait: Secondary | ICD-10-CM | POA: Diagnosis not present

## 2022-11-09 DIAGNOSIS — Z1151 Encounter for screening for human papillomavirus (HPV): Secondary | ICD-10-CM | POA: Insufficient documentation

## 2022-11-09 DIAGNOSIS — M25512 Pain in left shoulder: Secondary | ICD-10-CM

## 2022-11-09 DIAGNOSIS — Z124 Encounter for screening for malignant neoplasm of cervix: Secondary | ICD-10-CM

## 2022-11-09 DIAGNOSIS — E1142 Type 2 diabetes mellitus with diabetic polyneuropathy: Secondary | ICD-10-CM | POA: Diagnosis not present

## 2022-11-09 DIAGNOSIS — G8929 Other chronic pain: Secondary | ICD-10-CM | POA: Diagnosis not present

## 2022-11-09 DIAGNOSIS — R790 Abnormal level of blood mineral: Secondary | ICD-10-CM | POA: Diagnosis not present

## 2022-11-09 DIAGNOSIS — I700 Atherosclerosis of aorta: Secondary | ICD-10-CM | POA: Diagnosis not present

## 2022-11-09 DIAGNOSIS — I10 Essential (primary) hypertension: Secondary | ICD-10-CM | POA: Diagnosis not present

## 2022-11-09 DIAGNOSIS — K529 Noninfective gastroenteritis and colitis, unspecified: Secondary | ICD-10-CM | POA: Diagnosis not present

## 2022-11-09 LAB — CBC WITH DIFFERENTIAL/PLATELET
Absolute Monocytes: 757 cells/uL (ref 200–950)
Basophils Absolute: 26 cells/uL (ref 0–200)
Basophils Relative: 0.3 %
Eosinophils Absolute: 97 cells/uL (ref 15–500)
Eosinophils Relative: 1.1 %
HCT: 39.6 % (ref 35.0–45.0)
Hemoglobin: 13.1 g/dL (ref 11.7–15.5)
Lymphs Abs: 1910 cells/uL (ref 850–3900)
MCH: 27.3 pg (ref 27.0–33.0)
MCHC: 33.1 g/dL (ref 32.0–36.0)
MCV: 82.7 fL (ref 80.0–100.0)
MPV: 11.3 fL (ref 7.5–12.5)
Monocytes Relative: 8.6 %
Neutro Abs: 6010 cells/uL (ref 1500–7800)
Neutrophils Relative %: 68.3 %
Platelets: 245 10*3/uL (ref 140–400)
RBC: 4.79 10*6/uL (ref 3.80–5.10)
RDW: 14.2 % (ref 11.0–15.0)
Total Lymphocyte: 21.7 %
WBC: 8.8 10*3/uL (ref 3.8–10.8)

## 2022-11-09 LAB — COMPLETE METABOLIC PANEL WITH GFR
AG Ratio: 1.2 (calc) (ref 1.0–2.5)
ALT: 11 U/L (ref 6–29)
AST: 23 U/L (ref 10–35)
Albumin: 4 g/dL (ref 3.6–5.1)
Alkaline phosphatase (APISO): 94 U/L (ref 37–153)
BUN/Creatinine Ratio: 12 (calc) (ref 6–22)
BUN: 15 mg/dL (ref 7–25)
CO2: 28 mmol/L (ref 20–32)
Calcium: 9.1 mg/dL (ref 8.6–10.4)
Chloride: 103 mmol/L (ref 98–110)
Creat: 1.3 mg/dL — ABNORMAL HIGH (ref 0.50–1.05)
Globulin: 3.3 g/dL (calc) (ref 1.9–3.7)
Glucose, Bld: 89 mg/dL (ref 65–99)
Potassium: 3.7 mmol/L (ref 3.5–5.3)
Sodium: 139 mmol/L (ref 135–146)
Total Bilirubin: 0.6 mg/dL (ref 0.2–1.2)
Total Protein: 7.3 g/dL (ref 6.1–8.1)
eGFR: 46 mL/min/{1.73_m2} — ABNORMAL LOW (ref >=60)

## 2022-11-09 LAB — MICROALBUMIN / CREATININE URINE RATIO
Creatinine, Urine: 181 mg/dL (ref 20–275)
Microalb Creat Ratio: 28 mcg/mg creat (ref <30)
Microalb, Ur: 5.1 mg/dL

## 2022-11-09 LAB — LIPID PANEL
Cholesterol: 205 mg/dL — ABNORMAL HIGH (ref <200)
HDL: 61 mg/dL (ref >=50)
LDL Cholesterol (Calc): 124 mg/dL (calc) — ABNORMAL HIGH
Non-HDL Cholesterol (Calc): 144 mg/dL (calc) — ABNORMAL HIGH (ref <130)
Total CHOL/HDL Ratio: 3.4 (calc) (ref <5.0)
Triglycerides: 98 mg/dL (ref <150)

## 2022-11-09 LAB — MAGNESIUM: Magnesium: 1.9 mg/dL (ref 1.5–2.5)

## 2022-11-11 LAB — CYTOLOGY - PAP
Comment: NEGATIVE
Diagnosis: UNDETERMINED — AB
High risk HPV: NEGATIVE

## 2022-12-02 DIAGNOSIS — M19012 Primary osteoarthritis, left shoulder: Secondary | ICD-10-CM | POA: Diagnosis not present

## 2022-12-03 ENCOUNTER — Telehealth: Payer: Self-pay | Admitting: Family Medicine

## 2022-12-04 ENCOUNTER — Ambulatory Visit: Payer: Self-pay

## 2022-12-04 NOTE — Telephone Encounter (Unsigned)
Copied from CRM [id number]. Topic: Appointment Scheduling - Scheduling Inquiry for Clinic >> Dec 04, 2022 12:34 PM [place] wrote: Reason for CRM: Pt stated she had a pap smear done when she came in to see Dr.Sowles on 11/09/2022. Pt would like for me to ask Dr. Carlynn Purl if she still needs to keep her upcoming appointment for a physical on 12/09/2022.   Please advise.

## 2022-12-04 NOTE — Telephone Encounter (Signed)
Message from [place] sent at 12/04/2022 12:41 PM EST  Summary: Medication management   Pt stated at her last appointment her cholesterol was a little high, and she is unsure if Dr.Sowles would like for her to continue taking her medication; if yes, she stated she needs a refill. Pt was unsure of the medication name, mentioned atorvastatin.  Pt seeking clinical advice.        Called pt and she stated she has been out of Rosuvastatin for 2 months. Pt advised of message. Pt asked if she is to restart the Rosuvastatin alone or to take both medications (Zetia and Rosuvastatin)? Pt requests call back. Sending med refill request to office Last RF of Rosuvastatin was 11/15/21 for #90 1 RF. Med prescription end date 11/15/22. Reason for Disposition  [1] Caller has NON-URGENT medicine question about med that PCP prescribed AND [2] triager unable to answer question  Answer Assessment - Initial Assessment Questions 1. NAME of MEDICINE: "What medicine(s) are you calling about?"     Rosuvastatin and Zetia 2. QUESTION: "What is your question?" (e.g., double dose of medicine, side effect)     Should she take the Zetia alone or along with Rosuvastatin 3. PRESCRIBER: "Who prescribed the medicine?" Reason: if prescribed by specialist, call should be referred to that group.     PCP 4. SYMPTOMS: "Do you have any symptoms?" If Yes, ask: "What symptoms are you having?"  "How bad are the symptoms (e.g., mild, moderate, severe)     N/a 5. PREGNANCY:  "Is there any chance that you are pregnant?" "When was your last menstrual period?"     N/a  Protocols used: Medication Question Call-A-AH

## 2022-12-04 NOTE — Telephone Encounter (Signed)
Message from [place] sent at 12/04/2022 12:41 PM EST  Summary: Medication management   Pt stated at her last appointment her cholesterol was a little high, and she is unsure if Dr.Sowles would like for her to continue taking her medication; if yes, she stated she needs a refill. Pt was unsure of the medication name, mentioned atorvastatin.  Pt seeking clinical advice.        Called pt and LM on VM. See lab results on last Lipid level.

## 2022-12-07 ENCOUNTER — Other Ambulatory Visit: Payer: Self-pay | Admitting: Family Medicine

## 2022-12-07 MED ORDER — ROSUVASTATIN CALCIUM 40 MG PO TABS: 40.0000 mg | ORAL_TABLET | Freq: Every day | ORAL | 3 refills | Status: DC

## 2022-12-08 NOTE — Patient Instructions (Signed)
Preventive Care 40-64 Years Old, Female Preventive care refers to lifestyle choices and visits with your health care provider that can promote health and wellness. Preventive care visits are also called wellness exams. What can I expect for my preventive care visit? Counseling Your health care provider may ask you questions about your: Medical history, including: Past medical problems. Family medical history. Pregnancy history. Current health, including: Menstrual cycle. Method of birth control. Emotional well-being. Home life and relationship well-being. Sexual activity and sexual health. Lifestyle, including: Alcohol, nicotine or tobacco, and drug use. Access to firearms. Diet, exercise, and sleep habits. Work and work environment. Sunscreen use. Safety issues such as seatbelt and bike helmet use. Physical exam Your health care provider will check your: Height and weight. These may be used to calculate your BMI (body mass index). BMI is a measurement that tells if you are at a healthy weight. Waist circumference. This measures the distance around your waistline. This measurement also tells if you are at a healthy weight and may help predict your risk of certain diseases, such as type 2 diabetes and high blood pressure. Heart rate and blood pressure. Body temperature. Skin for abnormal spots. What immunizations do I need?  Vaccines are usually given at various ages, according to a schedule. Your health care provider will recommend vaccines for you based on your age, medical history, and lifestyle or other factors, such as travel or where you work. What tests do I need? Screening Your health care provider may recommend screening tests for certain conditions. This may include: Lipid and cholesterol levels. Diabetes screening. This is done by checking your blood sugar (glucose) after you have not eaten for a while (fasting). Pelvic exam and Pap test. Hepatitis B test. Hepatitis C  test. HIV (human immunodeficiency virus) test. STI (sexually transmitted infection) testing, if you are at risk. Lung cancer screening. Colorectal cancer screening. Mammogram. Talk with your health care provider about when you should start having regular mammograms. This may depend on whether you have a family history of breast cancer. BRCA-related cancer screening. This may be done if you have a family history of breast, ovarian, tubal, or peritoneal cancers. Bone density scan. This is done to screen for osteoporosis. Talk with your health care provider about your test results, treatment options, and if necessary, the need for more tests. Follow these instructions at home: Eating and drinking  Eat a diet that includes fresh fruits and vegetables, whole grains, lean protein, and low-fat dairy products. Take vitamin and mineral supplements as recommended by your health care provider. Do not drink alcohol if: Your health care provider tells you not to drink. You are pregnant, may be pregnant, or are planning to become pregnant. If you drink alcohol: Limit how much you have to 0-1 drink a day. Know how much alcohol is in your drink. In the [place] one drink equals one 12 oz bottle of beer (355 mL), one 5 oz glass of wine (148 mL), or one 1 oz glass of hard liquor (44 mL). Lifestyle Brush your teeth every morning and night with fluoride toothpaste. Floss one time each day. Exercise for at least 30 minutes 5 or more days each week. Do not use any products that contain nicotine or tobacco. These products include cigarettes, chewing tobacco, and vaping devices, such as e-cigarettes. If you need help quitting, ask your health care provider. Do not use drugs. If you are sexually active, practice safe sex. Use a condom or other form of protection to   prevent STIs. If you do not wish to become pregnant, use a form of birth control. If you plan to become pregnant, see your health care provider for a  prepregnancy visit. Take aspirin only as told by your health care provider. Make sure that you understand how much to take and what form to take. Work with your health care provider to find out whether it is safe and beneficial for you to take aspirin daily. Find healthy ways to manage stress, such as: Meditation, yoga, or listening to music. Journaling. Talking to a trusted person. Spending time with friends and family. Minimize exposure to UV radiation to reduce your risk of skin cancer. Safety Always wear your seat belt while driving or riding in a vehicle. Do not drive: If you have been drinking alcohol. Do not ride with someone who has been drinking. When you are tired or distracted. While texting. If you have been using any mind-altering substances or drugs. Wear a helmet and other protective equipment during sports activities. If you have firearms in your house, make sure you follow all gun safety procedures. Seek help if you have been physically or sexually abused. What's next? Visit your health care provider once a year for an annual wellness visit. Ask your health care provider how often you should have your eyes and teeth checked. Stay up to date on all vaccines. This information is not intended to replace advice given to you by your health care provider. Make sure you discuss any questions you have with your health care provider. Document Revised: 06/11/2021 Document Reviewed: 06/11/2021 Elsevier Patient Education  [place].

## 2022-12-08 NOTE — Progress Notes (Unsigned)
Name: Brandy Johnston   MRN: [id number]    DOB: May 29, 1961   Date:12/09/2022       Progress Note  Subjective  Chief Complaint  Annual Exam  HPI  Patient presents for annual CPE.  Diet: she does not follow a healthy diet but eats small portions  Exercise: discussed 150 minutes of physical activity per week   Last Eye Exam: up to date  Last Dental Exam: up to date   [employer] Visit from 12/09/2022 in [facility]  AUDIT-C Score 1      Depression: Phq 9 is  negative    12/09/2022   10:07 AM 11/09/2022    8:13 AM 09/29/2022    8:41 AM 05/16/2021    8:34 AM 01/15/2021   10:55 AM  Depression screen PHQ 2/9  Decreased Interest 0 0 0 0 0  Down, Depressed, Hopeless 0 0 0 0 0  PHQ - 2 Score 0 0 0 0 0  Altered sleeping 0 0 0    Tired, decreased energy 0 0 0    Change in appetite 0 0 0    Feeling bad or failure about yourself  0 0 0    Trouble concentrating 0 0 0    Moving slowly or fidgety/restless 0 0 0    Suicidal thoughts 0 0 0    PHQ-9 Score 0 0 0     Hypertension: BP Readings from Last 3 Encounters:  12/09/22 124/72  11/09/22 126/72  11/08/22 134/86   Obesity: Wt Readings from Last 3 Encounters:  12/09/22 163 lb 1.6 oz (74 kg)  11/09/22 161 lb (73 kg)  11/08/22 164 lb (74.4 kg)   BMI Readings from Last 3 Encounters:  12/09/22 28.00 kg/m  11/09/22 27.64 kg/m  11/08/22 28.15 kg/m     Vaccines:   RSV: up to date  Tdap: up to date Shingrix: up to date Pneumonia: up to date  Flu: up to date COVID-81: up to date   Hep C Screening: 08/05/12 STD testing and prevention (HIV/chl/gon/syphilis): 01/29/20 Intimate partner violence: negative screen  Sexual History : one partner, female  Menstrual History/LMP/Abnormal Bleeding: post-menopausal  Discussed importance of follow up if any post-menopausal bleeding: yes  Incontinence Symptoms: negative for symptoms   Breast cancer:  - Last Mammogram: 04/03/22 - BRCA gene screening: mother  and maternal aunt had breast cancer , she is not interested   Osteoporosis Prevention : Discussed high calcium and vitamin D supplementation, weight bearing exercises Bone density: 01/29/21  Cervical cancer screening: 11/09/22 ASCUS negative HPV and recheck in one year   Skin cancer: Discussed monitoring for atypical lesions  Colorectal cancer: 04/17/20   Lung cancer:  Low Dose CT Chest recommended if Age 63-80 years, 20 pack-year currently smoking OR have quit w/in 15years. Patient does not qualify for screen   ECG: 08/05/22  Advanced Care Planning: A voluntary discussion about advance care planning including the explanation and discussion of advance directives.  Discussed health care proxy and Living will, and the patient was able to identify a health care proxy as son.  Patient does not have a living will and power of attorney of health care   Lipids: Lab Results  Component Value Date   CHOL 205 (H) 11/09/2022   CHOL 173 05/16/2021   CHOL 216 (H) 11/13/2020   Lab Results  Component Value Date   HDL 61 11/09/2022   HDL 65 05/16/2021   HDL 85 11/13/2020   Lab Results  Component Value Date   LDLCALC 124 (H) 11/09/2022   LDLCALC 93 05/16/2021   LDLCALC 117 (H) 11/13/2020   Lab Results  Component Value Date   TRIG 98 11/09/2022   TRIG 67 05/16/2021   TRIG 45 11/13/2020   Lab Results  Component Value Date   CHOLHDL 3.4 11/09/2022   CHOLHDL 2.7 05/16/2021   CHOLHDL 2.5 11/13/2020   No results found for: "LDLDIRECT"  Glucose: Glucose  Date Value Ref Range Status  07/08/2012 90 65 - 99 mg/dL Final  07/07/2012 105 (H) 65 - 99 mg/dL Final   Glucose, Bld  Date Value Ref Range Status  11/09/2022 89 65 - 99 mg/dL Final    Comment:    .            Fasting reference interval .   11/08/2022 115 (H) 70 - 99 mg/dL Final    Comment:    Glucose reference range applies only to samples taken after fasting for at least 8 hours.  08/04/2022 95 70 - 99 mg/dL Final    Comment:     Glucose reference range applies only to samples taken after fasting for at least 8 hours.   Glucose-Capillary  Date Value Ref Range Status  01/30/2020 136 (H) 70 - 99 mg/dL Final  01/30/2020 118 (H) 70 - 99 mg/dL Final  01/29/2020 96 70 - 99 mg/dL Final    Patient Active Problem List   Diagnosis Date Noted   Chronic left shoulder pain 11/09/2022   Hyperlipidemia 01/14/2021   Incomplete tear of left rotator cuff 07/03/2020   Angioedema due to angiotensin converting enzyme inhibitor (ACE-I) 01/29/2020   Chronic bilateral back pain 07/26/2017   Coronary artery calcification 05/17/2017   Heart palpitations 05/15/2017   History of acute gastritis    Leukocytosis 09/30/2016   Atherosclerosis of abdominal aorta ([place]) 08/18/2016   Type 2 diabetes mellitus with stage 3 chronic kidney disease, without long-term current use of insulin ([place]) 12/16/2015   Diabetic neuropathy associated with type 2 diabetes mellitus ([place]) 09/18/2015   Insomnia 09/18/2015   Benign essential HTN 06/18/2015   Chronic kidney disease (CKD), stage III (moderate) ([place]) 06/18/2015   Diabetes mellitus with renal manifestation ([place]) 06/18/2015   Obesity (BMI 30.0-34.9) 06/18/2015   Degenerative arthritis of hip 06/18/2015   Sickle cell trait ([place]) 06/18/2015   Dyslipidemia 05/27/2010   Gastro-esophageal reflux disease without esophagitis 05/25/2008    Past Surgical History:  Procedure Laterality Date   BREAST BIOPSY Right 12/28/2019   stereo [facility] stromal fibrosis   CHOLECYSTECTOMY     COLONOSCOPY     ESOPHAGOGASTRODUODENOSCOPY (EGD) WITH PROPOFOL N/A 10/05/2016   Procedure: ESOPHAGOGASTRODUODENOSCOPY (EGD) WITH PROPOFOL;  Surgeon: Lucilla Lame, MD;  Location: [facility];  Service: Endoscopy;  Laterality: N/A;   FRACTURE SURGERY Left    cast and pins    SHOULDER ARTHROSCOPY WITH ROTATOR CUFF REPAIR AND SUBACROMIAL DECOMPRESSION Left 12/07/2018   Procedure: left shoulder manipulation under anesthesia, left  shoulder arthroscopic lysis of adhesions;  Surgeon: Lovell Sheehan, MD;  Location: [facility];  Service: Orthopedics;  Laterality: Left;   SHOULDER CLOSED REDUCTION Left 12/07/2018   Procedure: CLOSED MANIPULATION SHOULDER;  Surgeon: Lovell Sheehan, MD;  Location: [facility];  Service: Orthopedics;  Laterality: Left;   shoulder surgery  Left 06/10/2018   Dr. Marica Otter   TUBAL LIGATION      Family History  Problem Relation Age of Onset   Migraines Mother    Diabetes Mother  Cancer Mother        lung   Arthritis Brother    Breast cancer Maternal Aunt    Cancer Maternal Uncle        Lung and Colon   Cirrhosis Brother    Breast cancer Cousin     Social History   Socioeconomic History   Marital status: Single    Spouse name: Not on file   Number of children: 2   Years of education: Not on file   Highest education level: 12th grade  Occupational History   Occupation: disabled  Tobacco Use   Smoking status: Never   Smokeless tobacco: Never  Vaping Use   Vaping Use: Never used  Substance and Sexual Activity   Alcohol use: Yes    Alcohol/week: 0.0 standard drinks of alcohol    Comment: rare   Drug use: Yes    Types: Marijuana    Comment: smokes marijuana occasionally   Sexual activity: Yes    Partners: Female    Birth control/protection: Other-see comments  Other Topics Concern   Not on file  Social History Narrative   She 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, on disability secondary to shoulder injury in 2019    She has two grown children ( boy and a girl)    Social Determinants of Health   Financial Resource Strain: [place]  (12/09/2022)   Overall Financial Resource Strain (CARDIA)    Difficulty of Paying Living Expenses: Not hard at all  Food Insecurity: No Food Insecurity (12/09/2022)   Hunger Vital Sign    Worried About Running Out of Food in the Last Year: Never true    [place] in the Last Year: Never true  Transportation Needs:  No Transportation Needs (12/09/2022)   PRAPARE - Hydrologist (Medical): No    Lack of Transportation (Non-Medical): No  Physical Activity: Inactive (12/09/2022)   Exercise Vital Sign    Days of Exercise per Week: 0 days    Minutes of Exercise per Session: 0 min  Stress: No Stress Concern Present (12/09/2022)   [place]    Feeling of Stress : Not at all  Social Connections: Socially Isolated (12/09/2022)   Social Connection and Isolation Panel [NHANES]    Frequency of Communication with Friends and Family: More than three times a week    Frequency of Social Gatherings with Friends and Family: Twice a week    Attends Religious Services: Never    Marine scientist or Organizations: No    Attends Archivist Meetings: Never    Marital Status: Never married  Intimate Partner Violence: Not At Risk (12/09/2022)   Humiliation, Afraid, Rape, and Kick questionnaire    Fear of Current or Ex-Partner: No    Emotionally Abused: No    Physically Abused: No    Sexually Abused: No     Current Outpatient Medications:    aspirin (ASPIRIN 81) 81 MG chewable tablet, Chew 1 tablet (81 mg total) by mouth daily., Disp: 30 tablet, Rfl: 0   Blood Glucose Monitoring Suppl (CONTOUR NEXT ONE) KIT, USE TO TEST BLOOD SUGAR ONCE D, Disp: , Rfl:    Butalbital-APAP-Caffeine 50-300-40 MG CAPS, Take 1 capsule by mouth every 4 (four) hours as needed., Disp: 40 capsule, Rfl: 0   Cholecalciferol (VITAMIN D) 2000 units CAPS, Take 1 capsule (2,000 Units total) by mouth daily., Disp:  30 capsule, Rfl: 0   cyclobenzaprine (FLEXERIL) 5 MG tablet, Take 1 tablet (5 mg total) by mouth 3 (three) times daily., Disp: 90 tablet, Rfl: 5   diltiazem (CARDIZEM CD) 180 MG 24 hr capsule, TAKE 1 CAPSULE(180 MG) BY MOUTH DAILY, Disp: 90 capsule, Rfl: 0   famotidine (PEPCID) 20 MG tablet, Take 1 tablet (20 mg total) by mouth 2  (two) times daily., Disp: 180 tablet, Rfl: 1   gabapentin (NEURONTIN) 300 MG capsule, Take 1 capsule (300 mg total) by mouth 2 (two) times daily., Disp: 180 capsule, Rfl: 1   glucose blood test strip, Use as instructed, Disp: 100 each, Rfl: 12   loratadine (CLARITIN) 10 MG tablet, Take by mouth., Disp: , Rfl:    magnesium oxide (MAG-OX) 400 MG tablet, Take 1 tablet (400 mg total) by mouth 2 (two) times daily., Disp: 180 tablet, Rfl: 1   Microlet Lancets MISC, USE TO CHECK BLOOD SUGAR ONCE D, Disp: , Rfl:    ondansetron (ZOFRAN-ODT) 4 MG disintegrating tablet, Take 1 tablet (4 mg total) by mouth every 8 (eight) hours as needed for nausea or vomiting., Disp: 20 tablet, Rfl: 0   promethazine (PHENERGAN) 12.5 MG tablet, Take 1 tablet (12.5 mg total) by mouth every 6 (six) hours as needed for nausea or vomiting., Disp: 12 tablet, Rfl: 0   rosuvastatin (CRESTOR) 40 MG tablet, Take 1 tablet (40 mg total) by mouth daily., Disp: 90 tablet, Rfl: 3   Semaglutide,0.25 or 0.5MG/DOS, (OZEMPIC, 0.25 OR 0.5 MG/DOSE,) 2 MG/1.5ML SOPN, Inject 0.5 mg into the muscle every Monday., Disp: 9 mL, Rfl: 1   traZODone (DESYREL) 50 MG tablet, TAKE 1 TABLET(50 MG) BY MOUTH AT BEDTIME, Disp: 90 tablet, Rfl: 0   triamcinolone cream (KENALOG) 0.1 %, Apply 1 application topically 2 (two) times daily., Disp: 30 g, Rfl: 0  Allergies  Allergen Reactions   Ace Inhibitors Swelling    Angioedema    Lisinopril Swelling    Face and neck swelling     ROS  Constitutional: Negative for fever or weight change.  Respiratory: Negative for cough and shortness of breath.   Cardiovascular: Negative for chest pain or palpitations.  Gastrointestinal: Negative for abdominal pain, no bowel changes.  Musculoskeletal: Negative for gait problem or joint swelling.  Skin: Negative for rash.  Neurological: Negative for dizziness or headache.  No other specific complaints in a complete review of systems (except as listed in HPI above).    Objective  Vitals:   12/09/22 1005  BP: 124/72  Pulse: 78  Resp: 16  Temp: 98.6 F (37 C)  TempSrc: Oral  SpO2: 98%  Weight: 163 lb 1.6 oz (74 kg)  Height: _0  (1.626 m)    Body mass index is 28 kg/m.  Physical Exam  Constitutional: Patient appears well-developed and well-nourished. No distress.  HENT: Head: Normocephalic and atraumatic. Ears: B TMs ok, no erythema or effusion; Nose: Nose normal. Mouth/Throat: Oropharynx is clear and moist. No oropharyngeal exudate.  Eyes: Conjunctivae and EOM are normal. Pupils are equal, round, and reactive to light. No scleral icterus.  Neck: Normal range of motion. Neck supple. No JVD present. No thyromegaly present.  Cardiovascular: Normal rate, regular rhythm and normal heart sounds.  No murmur heard. No BLE edema. Pulmonary/Chest: Effort normal and breath sounds normal. No respiratory distress. Abdominal: Soft. Bowel sounds are normal, no distension. There is no tenderness. no masses Breast: no lumps or masses, no nipple discharge or rashes FEMALE GENITALIA:  Not done  RECTAL: not done  Musculoskeletal: Normal range of motion, no joint effusions. No gross deformities Neurological: he is alert and oriented to person, place, and time. No cranial nerve deficit. Coordination, balance, strength, speech and gait are normal.  Skin: Skin is warm and dry. No rash noted. No erythema.  Psychiatric: Patient has a normal mood and affect. behavior is normal. Judgment and thought content normal.   Recent Results (from the past 2160 hour(s))  POCT HgB A1C     Status: Abnormal   Collection Time: 09/29/22  8:42 AM  Result Value Ref Range   Hemoglobin A1C 6.1 (A) 4.0 - 5.6 %   HbA1c POC (<> result, manual entry)     HbA1c, POC (prediabetic range)     HbA1c, POC (controlled diabetic range)    Urine Microalbumin w/creat. ratio     Status: None   Collection Time: 09/29/22 10:48 AM  Result Value Ref Range   Creatinine, Urine 134 20 - 275 mg/dL    Microalb, Ur 1.3 mg/dL    Comment: Reference Range Not established    Microalb Creat Ratio 10 <30 mcg/mg creat    Comment: . The ADA defines abnormalities in albumin excretion as follows: Marland Kitchen Albuminuria Category        Result (mcg/mg creatinine) . Normal to Mildly increased   <30 Moderately increased         30-299  Severely increased           > OR = 300 . The ADA recommends that at least two of three specimens collected within a 3-6 month period be abnormal before considering a patient to be within a diagnostic category.   Lipase, blood     Status: None   Collection Time: 11/08/22  9:16 PM  Result Value Ref Range   Lipase 30 11 - 51 U/L    Comment: Performed at [facility], [address] [place], [place] [address]  Comprehensive metabolic panel     Status: Abnormal   Collection Time: 11/08/22  9:16 PM  Result Value Ref Range   Sodium 139 135 - 145 mmol/L   Potassium 3.7 3.5 - 5.1 mmol/L   Chloride 107 98 - 111 mmol/L   CO2 23 22 - 32 mmol/L   Glucose, Bld 115 (H) 70 - 99 mg/dL    Comment: Glucose reference range applies only to samples taken after fasting for at least 8 hours.   BUN 16 8 - 23 mg/dL   Creatinine, Ser 1.23 (H) 0.44 - 1.00 mg/dL   Calcium 8.9 8.9 - 10.3 mg/dL   Total Protein 8.0 6.5 - 8.1 g/dL   Albumin 3.9 3.5 - 5.0 g/dL   AST 27 15 - 41 U/L   ALT 14 0 - 44 U/L   Alkaline Phosphatase 91 38 - 126 U/L   Total Bilirubin 0.8 0.3 - 1.2 mg/dL   GFR, Estimated 49 (L) >60 mL/min    Comment: (NOTE) Calculated using the CKD-EPI Creatinine Equation (2021)    Anion gap 9 5 - 15    Comment: Performed at [facility], [address] [place], [place] [address]  CBC     Status: Abnormal   Collection Time: 11/08/22  9:16 PM  Result Value Ref Range   WBC 11.2 (H) 4.0 - 10.5 K/uL   RBC 4.80 3.87 - 5.11 MIL/uL   Hemoglobin 12.6 12.0 - 15.0 g/dL   HCT 38.9 36.0 - 46.0 %   MCV 81.0 80.0 - 100.0 fL  MCH 26.3 26.0 - 34.0 pg   MCHC 32.4 30.0 -  36.0 g/dL   RDW 14.6 11.5 - 15.5 %   Platelets 249 150 - 400 K/uL   nRBC 0.0 0.0 - 0.2 %    Comment: Performed at [facility], [address] [place], [place] [address]  Cytology - PAP     Status: Abnormal   Collection Time: 11/09/22  8:49 AM  Result Value Ref Range   High risk HPV Negative    Adequacy      Satisfactory for evaluation; transformation zone component PRESENT.   Diagnosis (A)     - Atypical squamous cells of undetermined significance (ASC-US)   Microorganisms Shift in flora suggestive of bacterial vaginosis    Comment Normal Reference Range HPV - Negative   Lipid panel     Status: Abnormal   Collection Time: 11/09/22  9:07 AM  Result Value Ref Range   Cholesterol 205 (H) <200 mg/dL   HDL 61 > OR = 50 mg/dL   Triglycerides 98 <150 mg/dL   LDL Cholesterol (Calc) 124 (H) mg/dL (calc)    Comment: Reference range: <100 . Desirable range <100 mg/dL for primary prevention;   <70 mg/dL for patients with CHD or diabetic patients  with > or = 2 CHD risk factors. Marland Kitchen LDL-C is now calculated using the Martin-Hopkins  calculation, which is a validated novel method providing  better accuracy than the Friedewald equation in the  estimation of LDL-C.  Cresenciano Genre et al. Annamaria Helling. 0814;481(85): 2061-2068  (http://education.[facility].com/faq/FAQ164)    Total CHOL/HDL Ratio 3.4 <5.0 (calc)   Non-HDL Cholesterol (Calc) 144 (H) <130 mg/dL (calc)    Comment: For patients with diabetes plus 1 major ASCVD risk  factor, treating to a non-HDL-C goal of <100 mg/dL  (LDL-C of <70 mg/dL) is considered a therapeutic  option.   CBC with Differential/Platelet     Status: None   Collection Time: 11/09/22  9:07 AM  Result Value Ref Range   WBC 8.8 3.8 - 10.8 Thousand/uL   RBC 4.79 3.80 - 5.10 Million/uL   Hemoglobin 13.1 11.7 - 15.5 g/dL   HCT 39.6 35.0 - 45.0 %   MCV 82.7 80.0 - 100.0 fL   MCH 27.3 27.0 - 33.0 pg   MCHC 33.1 32.0 - 36.0 g/dL   RDW 14.2 11.0 - 15.0 %    Platelets 245 140 - 400 Thousand/uL   MPV 11.3 7.5 - 12.5 fL   Neutro Abs 6,010 1,500 - 7,800 cells/uL   Lymphs Abs 1,910 850 - 3,900 cells/uL   Absolute Monocytes 757 200 - 950 cells/uL   Eosinophils Absolute 97 15 - 500 cells/uL   Basophils Absolute 26 0 - 200 cells/uL   Neutrophils Relative % 68.3 %   Total Lymphocyte 21.7 %   Monocytes Relative 8.6 %   Eosinophils Relative 1.1 %   Basophils Relative 0.3 %  COMPLETE METABOLIC PANEL WITH GFR     Status: Abnormal   Collection Time: 11/09/22  9:07 AM  Result Value Ref Range   Glucose, Bld 89 65 - 99 mg/dL    Comment: .            Fasting reference interval .    BUN 15 7 - 25 mg/dL   Creat 1.30 (H) 0.50 - 1.05 mg/dL   eGFR 46 (L) > OR = 60 mL/min/1.11m   BUN/Creatinine Ratio 12 6 - 22 (calc)   Sodium 139 135 - 146 mmol/L  Potassium 3.7 3.5 - 5.3 mmol/L   Chloride 103 98 - 110 mmol/L   CO2 28 20 - 32 mmol/L   Calcium 9.1 8.6 - 10.4 mg/dL   Total Protein 7.3 6.1 - 8.1 g/dL   Albumin 4.0 3.6 - 5.1 g/dL   Globulin 3.3 1.9 - 3.7 g/dL (calc)   AG Ratio 1.2 1.0 - 2.5 (calc)   Total Bilirubin 0.6 0.2 - 1.2 mg/dL   Alkaline phosphatase (APISO) 94 37 - 153 U/L   AST 23 10 - 35 U/L   ALT 11 6 - 29 U/L  Microalbumin / creatinine urine ratio     Status: None   Collection Time: 11/09/22  9:07 AM  Result Value Ref Range   Creatinine, Urine 181 20 - 275 mg/dL   Microalb, Ur 5.1 mg/dL    Comment: Reference Range Not established    Microalb Creat Ratio 28 <30 mcg/mg creat    Comment: . The ADA defines abnormalities in albumin excretion as follows: Marland Kitchen Albuminuria Category        Result (mcg/mg creatinine) . Normal to Mildly increased   <30 Moderately increased         30-299  Severely increased           > OR = 300 . The ADA recommends that at least two of three specimens collected within a 3-6 month period be abnormal before considering a patient to be within a diagnostic category.   Magnesium     Status: None   Collection  Time: 11/09/22  9:07 AM  Result Value Ref Range   Magnesium 1.9 1.5 - 2.5 mg/dL     Fall Risk:    11/09/2022    8:13 AM 09/29/2022    8:40 AM 05/16/2021    8:34 AM 01/15/2021   10:54 AM 12/31/2020    1:38 PM  [place] in the past year? 0 0 _0 Number falls in past yr: 0 0 0 0 0  Injury with Fall? 0 0 0 1 1  Risk for fall due to : No Fall Risks No Fall Risks   History of fall(s)  Follow up Falls prevention discussed Falls prevention discussed        Functional Status Survey: Is the patient deaf or have difficulty hearing?: No Does the patient have difficulty seeing, even when wearing glasses/contacts?: No Does the patient have difficulty concentrating, remembering, or making decisions?: No Does the patient have difficulty walking or climbing stairs?: No Does the patient have difficulty dressing or bathing?: No Does the patient have difficulty doing errands alone such as visiting a doctor's office or shopping?: No   Assessment & Plan  1. Well adult exam  Discussed increase physical activity and eat a healthier diet     -USPSTF grade A and B recommendations reviewed with patient; age-appropriate recommendations, preventive care, screening tests, etc discussed and encouraged; healthy living encouraged; see AVS for patient education given to patient -Discussed importance of 150 minutes of physical activity weekly, eat two servings of fish weekly, eat one serving of tree nuts ( cashews, pistachios, pecans, almonds.Marland Kitchen) every other day, eat 6 servings of fruit/vegetables daily and drink plenty of water and avoid sweet beverages.   -Reviewed Health Maintenance: Yes.

## 2022-12-09 ENCOUNTER — Encounter: Payer: Self-pay | Admitting: Family Medicine

## 2022-12-09 ENCOUNTER — Ambulatory Visit (INDEPENDENT_AMBULATORY_CARE_PROVIDER_SITE_OTHER): Payer: Medicare HMO | Admitting: Family Medicine

## 2022-12-09 VITALS — BP 124/72 | HR 78 | Temp 98.6°F | Resp 16 | Ht 64.0 in | Wt 163.1 lb

## 2022-12-09 DIAGNOSIS — Z0000 Encounter for general adult medical examination without abnormal findings: Secondary | ICD-10-CM | POA: Diagnosis not present

## 2022-12-29 NOTE — Progress Notes (Unsigned)
Name: Brandy Johnston   MRN: [id number]    DOB: 04-22-1961   Date:12/30/2022       Progress Note  Subjective  Chief Complaint  Follow Up  HPI  DMII with renal manifestation with CKI stage IIIa , and neuropathy. She continues to have numbness and tingling on  finger tips and toes and is stable magnesium and gabapentin. She checks glucose occasionally, FSBS highest of 120 lowest in the 70 Denies polyphagia, polydipsia or polyuria. She has been on Ozempic 0.5 mg weekly for many years . A1C has been controlled on current regiment today is up a little from 6.1 % to 6.5 %    Insomnia: She takes Trazodone prn, sleeping well most of the time Denies side effects    HTN: She is no longer taking ACE/ARB because she had angioedema, taking Cardizem and bp is at goal, denies chest pain, dizziness  or palpitation . Doing well on current regiment    Hyperlipidemia: she was out of Crestor when we checked labs last time, she has been taking it daily since Nov, we will recheck liver enzymes, goal LDL is below 100 best if below 70    GERD: she is taking Pepcid BID but has to take Tums prn when she eats greasy food. She used to take Omeprazole but was advised by GI to stopped medication   Left shoulder surgery : work related injury in January 2019, had three shoulder surgeries since, and on disability since Nov 2021.She is going to Ortho at [facility] , taking flexeril twice daily, she will have [place] with steroid injection in March , they offered to have a revision but she is not ready for it    Low calcium and magnesium: she was seen by Endocrinologist but lost to follow up. Last levels normal   Migraine/Tension headaches: she has episodes seldom, about one episode every 3 months Episodes are described as nuchal pain, , pain can be aching or  sharp, associated with photophobia, but no phonophobia. No nausea or vomiting. She takes prn Fioricet prn, sometimes just takes a nap and symptoms resolves    Atherosclerosis Aorta:  taking aspirin and statins daily now   Sickle Cell: unchanged   Patient Active Problem List   Diagnosis Date Noted   Chronic left shoulder pain 11/09/2022   Hyperlipidemia 01/14/2021   Incomplete tear of left rotator cuff 07/03/2020   Angioedema due to angiotensin converting enzyme inhibitor (ACE-I) 01/29/2020   Chronic bilateral back pain 07/26/2017   Coronary artery calcification 05/17/2017   Heart palpitations 05/15/2017   History of acute gastritis    Leukocytosis 09/30/2016   Atherosclerosis of abdominal aorta ([place]) 08/18/2016   Type 2 diabetes mellitus with stage 3 chronic kidney disease, without long-term current use of insulin ([place]) 12/16/2015   Diabetic neuropathy associated with type 2 diabetes mellitus ([place]) 09/18/2015   Insomnia 09/18/2015   Benign essential HTN 06/18/2015   Chronic kidney disease (CKD), stage III (moderate) ([place]) 06/18/2015   Diabetes mellitus with renal manifestation ([place]) 06/18/2015   Obesity (BMI 30.0-34.9) 06/18/2015   Degenerative arthritis of hip 06/18/2015   Sickle cell trait ([place]) 06/18/2015   Dyslipidemia 05/27/2010   Gastro-esophageal reflux disease without esophagitis 05/25/2008    Past Surgical History:  Procedure Laterality Date   BREAST BIOPSY Right 12/28/2019   stereo [facility] stromal fibrosis   CHOLECYSTECTOMY     COLONOSCOPY     ESOPHAGOGASTRODUODENOSCOPY (EGD) WITH PROPOFOL N/A 10/05/2016   Procedure: ESOPHAGOGASTRODUODENOSCOPY (EGD) WITH PROPOFOL;  Surgeon:  Lucilla Lame, MD;  Location: [place];  Service: Endoscopy;  Laterality: N/A;   FRACTURE SURGERY Left    cast and pins    SHOULDER ARTHROSCOPY WITH ROTATOR CUFF REPAIR AND SUBACROMIAL DECOMPRESSION Left 12/07/2018   Procedure: left shoulder manipulation under anesthesia, left shoulder arthroscopic lysis of adhesions;  Surgeon: Lovell Sheehan, MD;  Location: [facility];  Service: Orthopedics;  Laterality: Left;   SHOULDER CLOSED REDUCTION Left 12/07/2018   Procedure:  CLOSED MANIPULATION SHOULDER;  Surgeon: Lovell Sheehan, MD;  Location: [facility];  Service: Orthopedics;  Laterality: Left;   shoulder surgery  Left 06/10/2018   Dr. Marica Otter   TUBAL LIGATION      Family History  Problem Relation Age of Onset   Migraines Mother    Diabetes Mother    Cancer Mother        lung   Arthritis Brother    Breast cancer Maternal Aunt    Cancer Maternal Uncle        Lung and Colon   Cirrhosis Brother    Breast cancer Cousin     Social History   Tobacco Use   Smoking status: Never   Smokeless tobacco: Never  Substance Use Topics   Alcohol use: Yes    Alcohol/week: 0.0 standard drinks of alcohol    Comment: rare     Current Outpatient Medications:    aspirin (ASPIRIN 81) 81 MG chewable tablet, Chew 1 tablet (81 mg total) by mouth daily., Disp: 30 tablet, Rfl: 0   Blood Glucose Monitoring Suppl (CONTOUR NEXT ONE) KIT, USE TO TEST BLOOD SUGAR ONCE D, Disp: , Rfl:    Butalbital-APAP-Caffeine 50-300-40 MG CAPS, Take 1 capsule by mouth every 4 (four) hours as needed., Disp: 40 capsule, Rfl: 0   Cholecalciferol (VITAMIN D) 2000 units CAPS, Take 1 capsule (2,000 Units total) by mouth daily., Disp: 30 capsule, Rfl: 0   cyclobenzaprine (FLEXERIL) 5 MG tablet, Take 1 tablet (5 mg total) by mouth 3 (three) times daily., Disp: 90 tablet, Rfl: 5   diltiazem (CARDIZEM CD) 180 MG 24 hr capsule, TAKE 1 CAPSULE(180 MG) BY MOUTH DAILY, Disp: 90 capsule, Rfl: 0   famotidine (PEPCID) 20 MG tablet, Take 1 tablet (20 mg total) by mouth 2 (two) times daily., Disp: 180 tablet, Rfl: 1   gabapentin (NEURONTIN) 300 MG capsule, Take 1 capsule (300 mg total) by mouth 2 (two) times daily., Disp: 180 capsule, Rfl: 1   glucose blood test strip, Use as instructed, Disp: 100 each, Rfl: 12   loratadine (CLARITIN) 10 MG tablet, Take by mouth., Disp: , Rfl:    magnesium oxide (MAG-OX) 400 MG tablet, Take 1 tablet (400 mg total) by mouth 2 (two) times daily., Disp: 180 tablet, Rfl: 1    Microlet Lancets MISC, USE TO CHECK BLOOD SUGAR ONCE D, Disp: , Rfl:    ondansetron (ZOFRAN-ODT) 4 MG disintegrating tablet, Take 1 tablet (4 mg total) by mouth every 8 (eight) hours as needed for nausea or vomiting., Disp: 20 tablet, Rfl: 0   promethazine (PHENERGAN) 12.5 MG tablet, Take 1 tablet (12.5 mg total) by mouth every 6 (six) hours as needed for nausea or vomiting., Disp: 12 tablet, Rfl: 0   rosuvastatin (CRESTOR) 40 MG tablet, Take 1 tablet (40 mg total) by mouth daily., Disp: 90 tablet, Rfl: 3   Semaglutide,0.25 or 0.5MG/DOS, (OZEMPIC, 0.25 OR 0.5 MG/DOSE,) 2 MG/1.5ML SOPN, Inject 0.5 mg into the muscle every Monday., Disp: 9 mL, Rfl: 1   traZODone (  DESYREL) 50 MG tablet, TAKE 1 TABLET(50 MG) BY MOUTH AT BEDTIME, Disp: 90 tablet, Rfl: 0   triamcinolone cream (KENALOG) 0.1 %, Apply 1 application topically 2 (two) times daily., Disp: 30 g, Rfl: 0  Allergies  Allergen Reactions   Ace Inhibitors Swelling    Angioedema    Lisinopril Swelling    Face and neck swelling    I personally reviewed active problem list, medication list, allergies, family history, social history, health maintenance with the patient/caregiver today.   ROS  Constitutional: Negative for fever or weight change.  Respiratory: Negative for cough and shortness of breath.   Cardiovascular: Negative for chest pain or palpitations.  Gastrointestinal: Negative for abdominal pain, no bowel changes.  Musculoskeletal: Negative for gait problem or joint swelling.  Skin: Negative for rash.  Neurological: Negative for dizziness or headache.  No other specific complaints in a complete review of systems (except as listed in HPI above).   Objective  Vitals:   12/30/22 0906  BP: 132/72  Pulse: 75  Resp: 16  SpO2: 97%  Weight: 163 lb (73.9 kg)  Height: _0  (1.626 m)    Body mass index is 27.98 kg/m.  Physical Exam  Constitutional: Patient appears well-developed and well-nourished.  No distress.  HEENT: head  atraumatic, normocephalic, pupils equal and reactive to light,  neck supple, throat within normal limits Cardiovascular: Normal rate, regular rhythm and normal heart sounds.  No murmur heard. No BLE edema. Pulmonary/Chest: Effort normal and breath sounds normal. No respiratory distress. Abdominal: Soft.  There is no tenderness. Muscular Skeletal: decrease rom of left shoulder  Psychiatric: Patient has a normal mood and affect. behavior is normal. Judgment and thought content normal.   Recent Results (from the past 2160 hour(s))  Lipase, blood     Status: None   Collection Time: 11/08/22  9:16 PM  Result Value Ref Range   Lipase 30 11 - 51 U/L    Comment: Performed at [facility], [address] [place], [place] [address]  Comprehensive metabolic panel     Status: Abnormal   Collection Time: 11/08/22  9:16 PM  Result Value Ref Range   Sodium 139 135 - 145 mmol/L   Potassium 3.7 3.5 - 5.1 mmol/L   Chloride 107 98 - 111 mmol/L   CO2 23 22 - 32 mmol/L   Glucose, Bld 115 (H) 70 - 99 mg/dL    Comment: Glucose reference range applies only to samples taken after fasting for at least 8 hours.   BUN 16 8 - 23 mg/dL   Creatinine, Ser 1.23 (H) 0.44 - 1.00 mg/dL   Calcium 8.9 8.9 - 10.3 mg/dL   Total Protein 8.0 6.5 - 8.1 g/dL   Albumin 3.9 3.5 - 5.0 g/dL   AST 27 15 - 41 U/L   ALT 14 0 - 44 U/L   Alkaline Phosphatase 91 38 - 126 U/L   Total Bilirubin 0.8 0.3 - 1.2 mg/dL   GFR, Estimated 49 (L) >60 mL/min    Comment: (NOTE) Calculated using the CKD-EPI Creatinine Equation (2021)    Anion gap 9 5 - 15    Comment: Performed at [facility], [address] [place], [place] [address]  CBC     Status: Abnormal   Collection Time: 11/08/22  9:16 PM  Result Value Ref Range   WBC 11.2 (H) 4.0 - 10.5 K/uL   RBC 4.80 3.87 - 5.11 MIL/uL   Hemoglobin 12.6 12.0 - 15.0 g/dL  HCT 38.9 36.0 - 46.0 %   MCV 81.0 80.0 - 100.0 fL   MCH 26.3 26.0 - 34.0 pg   MCHC 32.4 30.0 - 36.0  g/dL   RDW 14.6 11.5 - 15.5 %   Platelets 249 150 - 400 K/uL   nRBC 0.0 0.0 - 0.2 %    Comment: Performed at [facility], [address] [place], [place] [address]  Cytology - PAP     Status: Abnormal   Collection Time: 11/09/22  8:49 AM  Result Value Ref Range   High risk HPV Negative    Adequacy      Satisfactory for evaluation; transformation zone component PRESENT.   Diagnosis (A)     - Atypical squamous cells of undetermined significance (ASC-US)   Microorganisms Shift in flora suggestive of bacterial vaginosis    Comment Normal Reference Range HPV - Negative   Lipid panel     Status: Abnormal   Collection Time: 11/09/22  9:07 AM  Result Value Ref Range   Cholesterol 205 (H) <200 mg/dL   HDL 61 > OR = 50 mg/dL   Triglycerides 98 <150 mg/dL   LDL Cholesterol (Calc) 124 (H) mg/dL (calc)    Comment: Reference range: <100 . Desirable range <100 mg/dL for primary prevention;   <70 mg/dL for patients with CHD or diabetic patients  with > or = 2 CHD risk factors. Marland Kitchen LDL-C is now calculated using the [place]-Hopkins  calculation, which is a validated novel method providing  better accuracy than the Friedewald equation in the  estimation of LDL-C.  Cresenciano Genre et al. Annamaria Helling. 6948;546(27): 2061-2068  (http://education.[facility].com/faq/FAQ164)    Total CHOL/HDL Ratio 3.4 <5.0 (calc)   Non-HDL Cholesterol (Calc) 144 (H) <130 mg/dL (calc)    Comment: For patients with diabetes plus 1 major ASCVD risk  factor, treating to a non-HDL-C goal of <100 mg/dL  (LDL-C of <70 mg/dL) is considered a therapeutic  option.   CBC with Differential/Platelet     Status: None   Collection Time: 11/09/22  9:07 AM  Result Value Ref Range   WBC 8.8 3.8 - 10.8 Thousand/uL   RBC 4.79 3.80 - 5.10 Million/uL   Hemoglobin 13.1 11.7 - 15.5 g/dL   HCT 39.6 35.0 - 45.0 %   MCV 82.7 80.0 - 100.0 fL   MCH 27.3 27.0 - 33.0 pg   MCHC 33.1 32.0 - 36.0 g/dL   RDW 14.2 11.0 - 15.0 %    Platelets 245 140 - 400 Thousand/uL   MPV 11.3 7.5 - 12.5 fL   Neutro Abs 6,010 1,500 - 7,800 cells/uL   Lymphs Abs 1,910 850 - 3,900 cells/uL   Absolute Monocytes 757 200 - 950 cells/uL   Eosinophils Absolute 97 15 - 500 cells/uL   Basophils Absolute 26 0 - 200 cells/uL   Neutrophils Relative % 68.3 %   Total Lymphocyte 21.7 %   Monocytes Relative 8.6 %   Eosinophils Relative 1.1 %   Basophils Relative 0.3 %  COMPLETE METABOLIC PANEL WITH GFR     Status: Abnormal   Collection Time: 11/09/22  9:07 AM  Result Value Ref Range   Glucose, Bld 89 65 - 99 mg/dL    Comment: .            Fasting reference interval .    BUN 15 7 - 25 mg/dL   Creat 1.30 (H) 0.50 - 1.05 mg/dL   eGFR 46 (L) > OR = 60 mL/min/1.82m   BUN/Creatinine  Ratio 12 6 - 22 (calc)   Sodium 139 135 - 146 mmol/L   Potassium 3.7 3.5 - 5.3 mmol/L   Chloride 103 98 - 110 mmol/L   CO2 28 20 - 32 mmol/L   Calcium 9.1 8.6 - 10.4 mg/dL   Total Protein 7.3 6.1 - 8.1 g/dL   Albumin 4.0 3.6 - 5.1 g/dL   Globulin 3.3 1.9 - 3.7 g/dL (calc)   AG Ratio 1.2 1.0 - 2.5 (calc)   Total Bilirubin 0.6 0.2 - 1.2 mg/dL   Alkaline phosphatase (APISO) 94 37 - 153 U/L   AST 23 10 - 35 U/L   ALT 11 6 - 29 U/L  Microalbumin / creatinine urine ratio     Status: None   Collection Time: 11/09/22  9:07 AM  Result Value Ref Range   Creatinine, Urine 181 20 - 275 mg/dL   Microalb, Ur 5.1 mg/dL    Comment: Reference Range Not established    Microalb Creat Ratio 28 <30 mcg/mg creat    Comment: . The ADA defines abnormalities in albumin excretion as follows: Marland Kitchen Albuminuria Category        Result (mcg/mg creatinine) . Normal to Mildly increased   <30 Moderately increased         30-299  Severely increased           > OR = 300 . The ADA recommends that at least two of three specimens collected within a 3-6 month period be abnormal before considering a patient to be within a diagnostic category.   Magnesium     Status: None   Collection  Time: 11/09/22  9:07 AM  Result Value Ref Range   Magnesium 1.9 1.5 - 2.5 mg/dL  POCT HgB A1C     Status: Abnormal   Collection Time: 12/30/22  9:07 AM  Result Value Ref Range   Hemoglobin A1C 6.3 (A) 4.0 - 5.6 %   HbA1c POC (<> result, manual entry)     HbA1c, POC (prediabetic range)     HbA1c, POC (controlled diabetic range)      PHQ2/9:    12/30/2022    9:07 AM 12/09/2022   10:07 AM 11/09/2022    8:13 AM 09/29/2022    8:41 AM 05/16/2021    8:34 AM  Depression screen PHQ 2/9  Decreased Interest 0 0 0 0 0  Down, Depressed, Hopeless 0 0 0 0 0  PHQ - 2 Score 0 0 0 0 0  Altered sleeping 0 0 0 0   Tired, decreased energy 0 0 0 0   Change in appetite 0 0 0 0   Feeling bad or failure about yourself  0 0 0 0   Trouble concentrating 0 0 0 0   Moving slowly or fidgety/restless 0 0 0 0   Suicidal thoughts 0 0 0 0   PHQ-9 Score 0 0 0 0     phq 9 is negative   Fall Risk:    12/30/2022    9:07 AM 11/09/2022    8:13 AM 09/29/2022    8:40 AM 05/16/2021    8:34 AM 01/15/2021   10:54 AM  Fall Risk   Falls in the past year? 0 0 0 1 1  Number falls in past yr: 0 0 0 0 0  Injury with Fall? 0 0 0 0 1  Risk for fall due to : No Fall Risks No Fall Risks No Fall Risks    Follow up Falls prevention discussed  Falls prevention discussed Falls prevention discussed        Functional Status Survey: Is the patient deaf or have difficulty hearing?: No Does the patient have difficulty seeing, even when wearing glasses/contacts?: No Does the patient have difficulty concentrating, remembering, or making decisions?: No Does the patient have difficulty walking or climbing stairs?: No Does the patient have difficulty dressing or bathing?: No Does the patient have difficulty doing errands alone such as visiting a doctor's office or shopping?: No    Assessment & Plan  1. Type 2 diabetes mellitus with stage 3a chronic kidney disease, without long-term current use of insulin (HCC)  - POCT HgB  A1C  2. Atherosclerosis of abdominal aorta ([place])  On statin therapy   3. Stage 3a chronic kidney disease (HCC)  - COMPLETE METABOLIC PANEL WITH GFR  4. Diabetic polyneuropathy associated with type 2 diabetes mellitus ([place])  - Semaglutide,0.25 or 0.5MG/DOS, (OZEMPIC, 0.25 OR 0.5 MG/DOSE,) 2 MG/1.5ML SOPN; Inject 0.5 mg into the muscle every Monday.  Dispense: 9 mL; Refill: 1  5. Sickle cell trait (HCC)  Unchanged   6. Migraine without aura and without status migrainosus, not intractable   7. Benign essential HTN   8. Long-term use of high-risk medication  - COMPLETE METABOLIC PANEL WITH GFR  9. Chronic bilateral low back pain with bilateral sciatica  - cyclobenzaprine (FLEXERIL) 5 MG tablet; Take 1 tablet (5 mg total) by mouth 2 (two) times daily.  Dispense: 180 tablet; Refill: 0  10. Gastro-esophageal reflux disease without esophagitis  - famotidine (PEPCID) 20 MG tablet; Take 1 tablet (20 mg total) by mouth 2 (two) times daily.  Dispense: 180 tablet; Refill: 1

## 2022-12-30 ENCOUNTER — Encounter: Payer: Self-pay | Admitting: Family Medicine

## 2022-12-30 ENCOUNTER — Ambulatory Visit (INDEPENDENT_AMBULATORY_CARE_PROVIDER_SITE_OTHER): Payer: Medicare HMO | Admitting: Family Medicine

## 2022-12-30 VITALS — BP 132/72 | HR 75 | Resp 16 | Ht 64.0 in | Wt 163.0 lb

## 2022-12-30 DIAGNOSIS — Z79899 Other long term (current) drug therapy: Secondary | ICD-10-CM | POA: Diagnosis not present

## 2022-12-30 DIAGNOSIS — I700 Atherosclerosis of aorta: Secondary | ICD-10-CM | POA: Diagnosis not present

## 2022-12-30 DIAGNOSIS — M5441 Lumbago with sciatica, right side: Secondary | ICD-10-CM

## 2022-12-30 DIAGNOSIS — I10 Essential (primary) hypertension: Secondary | ICD-10-CM

## 2022-12-30 DIAGNOSIS — E1142 Type 2 diabetes mellitus with diabetic polyneuropathy: Secondary | ICD-10-CM | POA: Diagnosis not present

## 2022-12-30 DIAGNOSIS — G43009 Migraine without aura, not intractable, without status migrainosus: Secondary | ICD-10-CM

## 2022-12-30 DIAGNOSIS — D573 Sickle-cell trait: Secondary | ICD-10-CM | POA: Diagnosis not present

## 2022-12-30 DIAGNOSIS — M5442 Lumbago with sciatica, left side: Secondary | ICD-10-CM | POA: Diagnosis not present

## 2022-12-30 DIAGNOSIS — N1831 Chronic kidney disease, stage 3a: Secondary | ICD-10-CM

## 2022-12-30 DIAGNOSIS — K219 Gastro-esophageal reflux disease without esophagitis: Secondary | ICD-10-CM

## 2022-12-30 DIAGNOSIS — E1122 Type 2 diabetes mellitus with diabetic chronic kidney disease: Secondary | ICD-10-CM | POA: Diagnosis not present

## 2022-12-30 DIAGNOSIS — N183 Chronic kidney disease, stage 3 unspecified: Secondary | ICD-10-CM

## 2022-12-30 DIAGNOSIS — G8929 Other chronic pain: Secondary | ICD-10-CM

## 2022-12-30 LAB — POCT GLYCOSYLATED HEMOGLOBIN (HGB A1C): Hemoglobin A1C: 6.3 % — AB (ref 4.0–5.6)

## 2022-12-30 LAB — COMPLETE METABOLIC PANEL WITH GFR
AG Ratio: 1.2 (calc) (ref 1.0–2.5)
ALT: 11 U/L (ref 6–29)
AST: 20 U/L (ref 10–35)
Albumin: 4 g/dL (ref 3.6–5.1)
Alkaline phosphatase (APISO): 106 U/L (ref 37–153)
BUN/Creatinine Ratio: 10 (calc) (ref 6–22)
BUN: 12 mg/dL (ref 7–25)
CO2: 29 mmol/L (ref 20–32)
Calcium: 9.3 mg/dL (ref 8.6–10.4)
Chloride: 106 mmol/L (ref 98–110)
Creat: 1.16 mg/dL — ABNORMAL HIGH (ref 0.50–1.05)
Globulin: 3.4 g/dL (calc) (ref 1.9–3.7)
Glucose, Bld: 78 mg/dL (ref 65–99)
Potassium: 4.2 mmol/L (ref 3.5–5.3)
Sodium: 141 mmol/L (ref 135–146)
Total Bilirubin: 0.4 mg/dL (ref 0.2–1.2)
Total Protein: 7.4 g/dL (ref 6.1–8.1)
eGFR: 53 mL/min/{1.73_m2} — ABNORMAL LOW (ref >=60)

## 2022-12-30 MED ORDER — OZEMPIC (0.25 OR 0.5 MG/DOSE) 2 MG/1.5ML ~~LOC~~ SOPN: 0.5000 mg | PEN_INJECTOR | SUBCUTANEOUS | 1 refills | Status: DC

## 2022-12-30 MED ORDER — FAMOTIDINE 20 MG PO TABS: 20.0000 mg | ORAL_TABLET | Freq: Two times a day (BID) | ORAL | 1 refills | Status: DC

## 2022-12-30 MED ORDER — CYCLOBENZAPRINE HCL 5 MG PO TABS: 5.0000 mg | ORAL_TABLET | Freq: Two times a day (BID) | ORAL | 0 refills | Status: DC

## 2023-01-19 ENCOUNTER — Telehealth (INDEPENDENT_AMBULATORY_CARE_PROVIDER_SITE_OTHER): Payer: Medicare HMO | Admitting: Physician Assistant

## 2023-01-19 DIAGNOSIS — Z0000 Encounter for general adult medical examination without abnormal findings: Secondary | ICD-10-CM

## 2023-01-19 NOTE — Patient Instructions (Signed)
Ms. Brandy Johnston , Thank you for taking time to come for your Medicare Wellness Visit. I appreciate your ongoing commitment to your health goals. Please review the following plan we discussed and let me know if I can assist you in the future.   Screening recommendations/referrals: Colonoscopy: Completed in 2021, repeat every 10 years  Mammogram: Completed 04/03/2022, repeat every 2 years  Bone Density: Completed in 2022 - demonstrated osteopenia. You can repeat this every 2 years for screening  Recommended yearly ophthalmology/optometry visit for glaucoma screening and checkup Recommended yearly dental visit for hygiene and checkup  Vaccinations: Influenza vaccine: Completed for 2023 flu season  Pneumococcal vaccine: Not completed. Please speak to your PCP about these vaccines to protect you from pneumonia  Tdap vaccine: Up to date, next due in 2029 Shingles vaccine: Completed   Covid-19:  Please stay up to date on the vaccine and booster recommendations for your age group.    Advanced directives: Not on file. I have attached information to your AVS about completing this for you to review. Please present the office with a copy once you have this done. .  Conditions/risks identified: None  Next appointment: Follow up in one year for your annual wellness visit.   Preventive Care 40-64 Years, Female Preventive care refers to lifestyle choices and visits with your health care provider that can promote health and wellness. What does preventive care include? A yearly physical exam. This is also called an annual well check. Dental exams once or twice a year. Routine eye exams. Ask your health care provider how often you should have your eyes checked. Personal lifestyle choices, including: Daily care of your teeth and gums. Regular physical activity. Eating a healthy diet. Avoiding tobacco and drug use. Limiting alcohol use. Practicing safe sex. Taking low-dose aspirin daily starting at age  64. Taking vitamin and mineral supplements as recommended by your health care provider. What happens during an annual well check? The services and screenings done by your health care provider during your annual well check will depend on your age, overall health, lifestyle risk factors, and family history of disease. Counseling  Your health care provider may ask you questions about your: Alcohol use. Tobacco use. Drug use. Emotional well-being. Home and relationship well-being. Sexual activity. Eating habits. Work and work Statistician. Method of birth control. Menstrual cycle. Pregnancy history. Screening  You may have the following tests or measurements: Height, weight, and BMI. Blood pressure. Lipid and cholesterol levels. These may be checked every 5 years, or more frequently if you are over 41 years old. Skin check. Lung cancer screening. You may have this screening every year starting at age 22 if you have a 30-pack-year history of smoking and currently smoke or have quit within the past 15 years. Fecal occult blood test (FOBT) of the stool. You may have this test every year starting at age 69. Flexible sigmoidoscopy or colonoscopy. You may have a sigmoidoscopy every 5 years or a colonoscopy every 10 years starting at age 53. Hepatitis C blood test. Hepatitis B blood test. Sexually transmitted disease (STD) testing. Diabetes screening. This is done by checking your blood sugar (glucose) after you have not eaten for a while (fasting). You may have this done every 1-3 years. Mammogram. This may be done every 1-2 years. Talk to your health care provider about when you should start having regular mammograms. This may depend on whether you have a family history of breast cancer. BRCA-related cancer screening. This may be done if  you have a family history of breast, ovarian, tubal, or peritoneal cancers. Pelvic exam and Pap test. This may be done every 3 years starting at age 71.  Starting at age 31, this may be done every 5 years if you have a Pap test in combination with an HPV test. Bone density scan. This is done to screen for osteoporosis. You may have this scan if you are at high risk for osteoporosis. Discuss your test results, treatment options, and if necessary, the need for more tests with your health care provider. Vaccines  Your health care provider may recommend certain vaccines, such as: Influenza vaccine. This is recommended every year. Tetanus, diphtheria, and acellular pertussis (Tdap, Td) vaccine. You may need a Td booster every 10 years. Zoster vaccine. You may need this after age 41. Pneumococcal 13-valent conjugate (PCV13) vaccine. You may need this if you have certain conditions and were not previously vaccinated. Pneumococcal polysaccharide (PPSV23) vaccine. You may need one or two doses if you smoke cigarettes or if you have certain conditions. Talk to your health care provider about which screenings and vaccines you need and how often you need them. This information is not intended to replace advice given to you by your health care provider. Make sure you discuss any questions you have with your health care provider. Document Released: 01/10/2016 Document Revised: 09/02/2016 Document Reviewed: 10/15/2015 Elsevier Interactive Patient Education  2017 [place] Prevention in the Home Falls can cause injuries. They can happen to people of all ages. There are many things you can do to make your home safe and to help prevent falls. What can I do on the outside of my home? Regularly fix the edges of walkways and driveways and fix any cracks. Remove anything that might make you trip as you walk through a door, such as a raised step or threshold. Trim any bushes or trees on the path to your home. Use bright outdoor lighting. Clear any walking paths of anything that might make someone trip, such as rocks or tools. Regularly check to see if  handrails are loose or broken. Make sure that both sides of any steps have handrails. Any raised decks and porches should have guardrails on the edges. Have any leaves, snow, or ice cleared regularly. Use sand or salt on walking paths during winter. Clean up any spills in your garage right away. This includes oil or grease spills. What can I do in the bathroom? Use night lights. Install grab bars by the toilet and in the tub and shower. Do not use towel bars as grab bars. Use non-skid mats or decals in the tub or shower. If you need to sit down in the shower, use a plastic, non-slip stool. Keep the floor dry. Clean up any water that spills on the floor as soon as it happens. Remove soap buildup in the tub or shower regularly. Attach bath mats securely with double-sided non-slip rug tape. Do not have throw rugs and other things on the floor that can make you trip. What can I do in the bedroom? Use night lights. Make sure that you have a light by your bed that is easy to reach. Do not use any sheets or blankets that are too big for your bed. They should not hang down onto the floor. Have a firm chair that has side arms. You can use this for support while you get dressed. Do not have throw rugs and other things on the floor  that can make you trip. What can I do in the kitchen? Clean up any spills right away. Avoid walking on wet floors. Keep items that you use a lot in easy-to-reach places. If you need to reach something above you, use a strong step stool that has a grab bar. Keep electrical cords out of the way. Do not use floor polish or wax that makes floors slippery. If you must use wax, use non-skid floor wax. Do not have throw rugs and other things on the floor that can make you trip. What can I do with my stairs? Do not leave any items on the stairs. Make sure that there are handrails on both sides of the stairs and use them. Fix handrails that are broken or loose. Make sure that  handrails are as long as the stairways. Check any carpeting to make sure that it is firmly attached to the stairs. Fix any carpet that is loose or worn. Avoid having throw rugs at the top or bottom of the stairs. If you do have throw rugs, attach them to the floor with carpet tape. Make sure that you have a light switch at the top of the stairs and the bottom of the stairs. If you do not have them, ask someone to add them for you. What else can I do to help prevent falls? Wear shoes that: Do not have high heels. Have rubber bottoms. Are comfortable and fit you well. Are closed at the toe. Do not wear sandals. If you use a stepladder: Make sure that it is fully opened. Do not climb a closed stepladder. Make sure that both sides of the stepladder are locked into place. Ask someone to hold it for you, if possible. Clearly mark and make sure that you can see: Any grab bars or handrails. First and last steps. Where the edge of each step is. Use tools that help you move around (mobility aids) if they are needed. These include: Canes. Walkers. Scooters. Crutches. Turn on the lights when you go into a dark area. Replace any light bulbs as soon as they burn out. Set up your furniture so you have a clear path. Avoid moving your furniture around. If any of your floors are uneven, fix them. If there are any pets around you, be aware of where they are. Review your medicines with your doctor. Some medicines can make you feel dizzy. This can increase your chance of falling. Ask your doctor what other things that you can do to help prevent falls. This information is not intended to replace advice given to you by your health care provider. Make sure you discuss any questions you have with your health care provider. Document Released: 10/10/2009 Document Revised: 05/21/2016 Document Reviewed: 01/18/2015 Elsevier Interactive Patient Education  2017 [employer].

## 2023-01-19 NOTE — Progress Notes (Signed)
Annual Wellness Visit.  Virtual Visit via Video Note  I connected with Brandy Johnston on 01/19/23 at  8:20 AM EST by a video enabled telemedicine application and verified that I am speaking with the correct person using two identifiers.  Location: Patient: At  home, in [place], [place]  Provider: [facility], [place], [place]    I discussed the limitations of evaluation and management by telemedicine and the availability of in person appointments. The patient expressed understanding and agreed to proceed.  Today's Provider: Jacquelin Hawking, MHS, PA-C Introduced myself to the patient as a PA-C and provided education on APPs in clinical practice.     Subjective:   Brandy Johnston is a 62 y.o. female who presents for Medicare Annual (Subsequent) preventive examination.  Review of Systems:         Objective:     Vitals: There were no vitals taken for this visit.  There is no height or weight on file to calculate BMI.     08/04/2022    6:03 PM 12/26/2020    2:23 PM 01/29/2020   11:30 PM 11/21/2019    3:30 PM 12/07/2018   11:32 AM 10/27/2017    1:02 PM 09/16/2017    9:25 AM  Advanced Directives  Does Patient Have a Medical Advance Directive? No No No No No No No  Would patient like information on creating a medical advance directive?   No - Patient declined No - Patient declined No - Patient declined      Tobacco Social History   Tobacco Use  Smoking Status Never  Smokeless Tobacco Never     Counseling given: Not Answered   Clinical Intake:  Pre-visit preparation completed: Yes  Pain : 0-10 Pain Score: 3  Pain Location: Abdomen Pain Orientation: Mid Pain Descriptors / Indicators: Cramping Pain Onset: Today     Nutritional Status: BMI 25 -29 Overweight Nutritional Risks: None Diabetes: Yes CBG done?: No Did pt. bring in CBG monitor from home?: No  How often do you need to have someone help you when you read instructions, pamphlets, or other  written materials from your doctor or pharmacy?: 1 - Never What is the last grade level you completed in school?: 12th grade  Interpreter Needed?: No     Past Medical History:  Diagnosis Date   Anemia    Chronic renal impairment, stage 3 (moderate) (HCC)    Diabetes mellitus without complication (HCC)    No longer on meds   GERD (gastroesophageal reflux disease)    Hyperlipidemia    Hypertension    Hypokalemia    Insomnia    Low calcium levels    Osteoarthrosis, hip    right hip   Paresthesia    Sickle cell anemia (HCC)    Sickle-cell trait (HCC)    Tension headache    1x/mo   Wears contact lenses    Past Surgical History:  Procedure Laterality Date   BREAST BIOPSY Right 12/28/2019   stereo [facility] stromal fibrosis   CHOLECYSTECTOMY     COLONOSCOPY     ESOPHAGOGASTRODUODENOSCOPY (EGD) WITH PROPOFOL N/A 10/05/2016   Procedure: ESOPHAGOGASTRODUODENOSCOPY (EGD) WITH PROPOFOL;  Surgeon: Midge Minium, MD;  Location: [facility];  Service: Endoscopy;  Laterality: N/A;   FRACTURE SURGERY Left    cast and pins    SHOULDER ARTHROSCOPY WITH ROTATOR CUFF REPAIR AND SUBACROMIAL DECOMPRESSION Left 12/07/2018   Procedure: left shoulder manipulation under anesthesia, left shoulder  arthroscopic lysis of adhesions;  Surgeon: Lyndle Herrlich, MD;  Location: [facility];  Service: Orthopedics;  Laterality: Left;   SHOULDER CLOSED REDUCTION Left 12/07/2018   Procedure: CLOSED MANIPULATION SHOULDER;  Surgeon: Lyndle Herrlich, MD;  Location: [facility];  Service: Orthopedics;  Laterality: Left;   shoulder surgery  Left 06/10/2018   Dr. Derryl Harbor   TUBAL LIGATION     Family History  Problem Relation Age of Onset   Migraines Mother    Diabetes Mother    Cancer Mother        lung   Arthritis Brother    Breast cancer Maternal Aunt    Cancer Maternal Uncle        Lung and Colon   Cirrhosis Brother    Breast cancer Cousin    Social History   Socioeconomic History   Marital status: Single     Spouse name: Not on file   Number of children: 2   Years of education: Not on file   Highest education level: 12th grade  Occupational History   Occupation: disabled  Tobacco Use   Smoking status: Never   Smokeless tobacco: Never  Vaping Use   Vaping Use: Never used  Substance and Sexual Activity   Alcohol use: Yes    Alcohol/week: 0.0 standard drinks of alcohol    Comment: rare   Drug use: Yes    Types: Marijuana    Comment: smokes marijuana occasionally   Sexual activity: Yes    Partners: Female    Birth control/protection: Other-see comments  Other Topics Concern   Not on file  Social History Narrative   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, on disability secondary to shoulder injury in 2019    She has two grown children ( boy and a girl)    Social Determinants of Health   Financial Resource Strain: Low Risk  (12/09/2022)   Overall Financial Resource Strain (CARDIA)    Difficulty of Paying Living Expenses: Not hard at all  Food Insecurity: No Food Insecurity (12/09/2022)   Hunger Vital Sign    Worried About Running Out of Food in the Last Year: Never true    Ran Out of Food in the Last Year: Never true  Transportation Needs: No Transportation Needs (12/09/2022)   PRAPARE - Administrator, Civil Service (Medical): No    Lack of Transportation (Non-Medical): No  Physical Activity: Inactive (12/09/2022)   Exercise Vital Sign    Days of Exercise per Week: 0 days    Minutes of Exercise per Session: 0 min  Stress: No Stress Concern Present (12/09/2022)   [employer] of Occupational Health - Occupational Stress Questionnaire    Feeling of Stress : Not at all  Social Connections: Socially Isolated (12/09/2022)   Social Connection and Isolation Panel [NHANES]    Frequency of Communication with Friends and Family: More than three times a week    Frequency of Social Gatherings with Friends and Family: Twice a week    Attends  Religious Services: Never    Database administrator or Organizations: No    Attends Banker Meetings: Never    Marital Status: Never married    Outpatient Encounter Medications as of 01/19/2023  Medication Sig   aspirin (ASPIRIN 81) 81 MG chewable tablet Chew 1 tablet (81 mg total) by mouth daily.   Blood Glucose Monitoring Suppl (CONTOUR NEXT ONE) KIT USE TO TEST BLOOD SUGAR ONCE D  Butalbital-APAP-Caffeine 50-300-40 MG CAPS Take 1 capsule by mouth every 4 (four) hours as needed.   Cholecalciferol (VITAMIN D) 2000 units CAPS Take 1 capsule (2,000 Units total) by mouth daily.   cyclobenzaprine (FLEXERIL) 5 MG tablet Take 1 tablet (5 mg total) by mouth 2 (two) times daily.   diltiazem (CARDIZEM CD) 180 MG 24 hr capsule TAKE 1 CAPSULE(180 MG) BY MOUTH DAILY   famotidine (PEPCID) 20 MG tablet Take 1 tablet (20 mg total) by mouth 2 (two) times daily.   gabapentin (NEURONTIN) 300 MG capsule Take 1 capsule (300 mg total) by mouth 2 (two) times daily.   glucose blood test strip Use as instructed   loratadine (CLARITIN) 10 MG tablet Take by mouth.   magnesium oxide (MAG-OX) 400 MG tablet Take 1 tablet (400 mg total) by mouth 2 (two) times daily.   Microlet Lancets MISC USE TO CHECK BLOOD SUGAR ONCE D   rosuvastatin (CRESTOR) 40 MG tablet Take 1 tablet (40 mg total) by mouth daily.   Semaglutide,0.25 or 0.5MG /DOS, (OZEMPIC, 0.25 OR 0.5 MG/DOSE,) 2 MG/1.5ML SOPN Inject 0.5 mg into the muscle every Monday.   traZODone (DESYREL) 50 MG tablet TAKE 1 TABLET(50 MG) BY MOUTH AT BEDTIME   triamcinolone cream (KENALOG) 0.1 % Apply 1 application topically 2 (two) times daily.   [DISCONTINUED] lisinopril (ZESTRIL) 10 MG tablet Take 1 tablet (10 mg total) by mouth daily.   No facility-administered encounter medications on file as of 01/19/2023.    Activities of Daily Living    01/19/2023    8:47 AM 01/19/2023    8:06 AM  In your present state of health, do you have any difficulty performing  the following activities:  Hearing?  0  Vision?  0  Difficulty concentrating or making decisions?  0  Walking or climbing stairs?  0  Dressing or bathing?  0  Doing errands, shopping?  0  Preparing Food and eating ? N   Using the Toilet? N   In the past six months, have you accidently leaked urine? N   Do you have problems with loss of bowel control? N   Managing your Medications? N   Managing your Finances? N   Housekeeping or managing your Housekeeping? N     Patient Care Team: Steele Sizer, MD as PCP - General (Family Medicine) Minna Merritts, MD as Consulting Physician (Cardiology) Cathi Roan, [facility] (Inactive) (Pharmacist)    Assessment:   This is a routine wellness examination for [place].  Exercise Activities and Dietary recommendations Type of exercise: calisthenics;stretching (Trying to improve arm strength), Time (Minutes): 30, Frequency (Times/Week): 3, Weekly Exercise (Minutes/Week): 90, Intensity: Mild   Goals Addressed   None     Fall Risk:    01/19/2023    8:06 AM 12/30/2022    9:07 AM 11/09/2022    8:13 AM 09/29/2022    8:40 AM 05/16/2021    8:34 AM  Fall Risk   Falls in the past year? 0 0 0 0 1  Number falls in past yr: 0 0 0 0 0  Injury with Fall? 0 0 0 0 0  Risk for fall due to : No Fall Risks No Fall Risks No Fall Risks No Fall Risks   Follow up Falls prevention discussed;Education provided;Falls evaluation completed Falls prevention discussed Falls prevention discussed Falls prevention discussed     FALL RISK PREVENTION PERTAINING TO THE HOME:  Any stairs in or around the home? Yes  If so, are there any  without handrails? Yes   Home free of loose throw rugs in walkways, pet beds, electrical cords, etc? Yes  Adequate lighting in your home to reduce risk of falls? Yes   ASSISTIVE DEVICES UTILIZED TO PREVENT FALLS:  Life alert? Yes  Use of a cane, walker or w/c? No  Grab bars in the bathroom? Yes  Shower chair or bench in shower? No   Elevated toilet seat or a handicapped toilet? No   DME ORDERS:  DME order needed?  No   TIMED UP AND GO:  Was the test performed?  No video visit .  Length of time to ambulate 10 feet: 0 sec.      Depression Screen    01/19/2023    8:07 AM 12/30/2022    9:07 AM 12/09/2022   10:07 AM 11/09/2022    8:13 AM  PHQ 2/9 Scores  PHQ - 2 Score 0 0 0 0  PHQ- 9 Score 0 0 0 0     Cognitive Function        01/19/2023    8:50 AM  6CIT Screen  What Year? 0 points  What month? 0 points  What time? 0 points  Count back from 20 0 points  Months in reverse 4 points  Repeat phrase 0 points  Total Score 4 points    Immunization History  Administered Date(s) Administered   Covid-19, Mrna,Vaccine(Spikevax)35yrs and older 12/07/2022   Influenza, Seasonal, Injecte, Preservative Fre 08/19/2012   Influenza,inj,Quad PF,6+ Mos 08/09/2014, 08/16/2015, 01/20/2017, 09/16/2017, 09/19/2018, 09/29/2022   Influenza-Unspecified 08/09/2014, 10/07/2020   PFIZER(Purple Top)SARS-COV-2 Vaccination 03/02/2020, 03/23/2020, 10/28/2020   Pfizer Covid-19 Vaccine Bivalent Booster 90yrs & up 11/26/2021   Pneumococcal Conjugate-13 12/03/2015   Pneumococcal Polysaccharide-23 06/19/2010   Respiratory Syncytial Virus Vaccine,Recomb Aduvanted(Arexvy) 12/07/2022   Tdap 05/25/2008, 09/28/2018   Zoster Recombinat (Shingrix) 05/16/2021, 09/04/2021    Qualifies for Shingles Vaccine? Completed  Tdap: Up to date  Flu Vaccine: Up to date  Pneumococcal Vaccine: Up to date   Covid-19 Vaccine: Completed vaccines  Screening Tests Health Maintenance  Topic Date Due   Medicare Annual Wellness (AWV)  01/15/2022   COVID-19 Vaccine (6 - 2023-24 season) 02/01/2023   OPHTHALMOLOGY EXAM  02/02/2023   HEMOGLOBIN A1C  06/30/2023   FOOT EXAM  09/30/2023   Diabetic kidney evaluation - Urine ACR  11/10/2023   PAP SMEAR-Modifier  11/10/2023   Diabetic kidney evaluation - eGFR measurement  12/31/2023   MAMMOGRAM   04/03/2024   DTaP/Tdap/Td (3 - Td or Tdap) 09/28/2028   COLONOSCOPY (Pts 45-66yrs Insurance coverage will need to be confirmed)  04/17/2030   INFLUENZA VACCINE  Completed   Hepatitis C Screening  Completed   HIV Screening  Completed   Zoster Vaccines- Shingrix  Completed   HPV VACCINES  Aged Out    Cancer Screenings:  Colorectal Screening: Completed 04/17/2020. Repeat every 10 years  Mammogram: Completed 04/03/2022. Repeat every 2 year  Bone Density: Completed 01/29/2021. Results reflect OSTEOPENIA,   Lung Cancer Screening: (Low Dose CT Chest recommended if Age 21-80 years, 30 pack-year currently smoking OR have quit w/in 15years.) does not qualify.     Additional Screening:  Hepatitis C Screening:  Completed 08/05/2012  Vision Screening: Recommended annual ophthalmology exams for early detection of glaucoma and other disorders of the eye. Is the patient up to date with their annual eye exam?  Yes  Who is the provider or what is the name of the office in which the pt attends annual eye exams?  [facility]   Dental Screening: Recommended annual dental exams for proper oral hygiene  Community Resource Referral:  CRR required this visit?  No       Plan:  I have personally reviewed and addressed the Medicare Annual Wellness questionnaire and have noted the following in the patient's chart:  A. Medical and social history B. Use of alcohol, tobacco or illicit drugs  C. Current medications and supplements D. Functional ability and status E.  Nutritional status F.  Physical activity G. Advance directives H. List of other physicians I.  Hospitalizations, surgeries, and ER visits in previous 12 months J.  [place] such as hearing and vision if needed, cognitive and depression L. Referrals and appointments   In addition, I have reviewed and discussed with patient certain preventive protocols, quality metrics, and best practice recommendations. A written  personalized care plan for preventive services as well as general preventive health recommendations were provided to patient.  Signed,  Talitha Givens, MHS, PA-C [facility]

## 2023-02-08 ENCOUNTER — Other Ambulatory Visit: Payer: Self-pay | Admitting: Family Medicine

## 2023-02-08 DIAGNOSIS — E1142 Type 2 diabetes mellitus with diabetic polyneuropathy: Secondary | ICD-10-CM

## 2023-02-08 DIAGNOSIS — G8929 Other chronic pain: Secondary | ICD-10-CM

## 2023-02-08 NOTE — Telephone Encounter (Unsigned)
Copied from [place] [phone]. Topic: General - Other >> Feb 08, 2023 12:19 PM Everette C wrote: Reason for CRM: Medication Refill - Medication: gabapentin (NEURONTIN) 300 MG capsule [id number]  Has the patient contacted their pharmacy? Yes.   (Agent: If no, request that the patient contact the pharmacy for the refill. If patient does not wish to contact the pharmacy document the reason why and proceed with request.) (Agent: If yes, when and what did the pharmacy advise?)  Preferred Pharmacy (with phone number or street name): [facility], [address] [facility] [place] [address] Phone: [phone] Fax: [phone] Hours: Not open 24 hours   Has the patient been seen for an appointment in the last year OR does the patient have an upcoming appointment? Yes.    Agent: Please be advised that RX refills may take up to 3 business days. We ask that you follow-up with your pharmacy.

## 2023-02-09 MED ORDER — GABAPENTIN 300 MG PO CAPS: 300.0000 mg | ORAL_CAPSULE | Freq: Two times a day (BID) | ORAL | 3 refills | Status: DC

## 2023-02-09 NOTE — Telephone Encounter (Signed)
Requested Prescriptions  Pending Prescriptions Disp Refills   gabapentin (NEURONTIN) 300 MG capsule 180 capsule 3    Sig: Take 1 capsule (300 mg total) by mouth 2 (two) times daily.     Neurology: Anticonvulsants - gabapentin Failed - 02/08/2023 12:46 PM      Failed - Cr in normal range and within 360 days    Creat  Date Value Ref Range Status  12/30/2022 1.16 (H) 0.50 - 1.05 mg/dL Final   Creatinine, Urine  Date Value Ref Range Status  11/09/2022 181 20 - 275 mg/dL Final         Passed - Completed PHQ-2 or PHQ-9 in the last 360 days      Passed - Valid encounter within last 12 months    Recent Outpatient Visits           3 weeks ago Encounter for [employer] annual wellness exam   [facility] Mecum, Erin E, PA-C   1 month ago Type 2 diabetes mellitus with stage 3a chronic kidney disease, without long-term current use of insulin [facility])   [facility] Steele Sizer, MD   2 months ago Well adult exam   [facility] Steele Sizer, MD   3 months ago Chronic left shoulder pain   [facility] Athena, Drue Stager, MD   4 months ago Diabetic polyneuropathy associated with type 2 diabetes mellitus [facility])   [facility] Steele Sizer, MD       Future Appointments             In 2 months Ancil Boozer, Drue Stager, MD [facility], [facility]

## 2023-02-18 ENCOUNTER — Other Ambulatory Visit: Payer: Self-pay | Admitting: Family Medicine

## 2023-02-18 DIAGNOSIS — Z1231 Encounter for screening mammogram for malignant neoplasm of breast: Secondary | ICD-10-CM

## 2023-03-04 DIAGNOSIS — M19012 Primary osteoarthritis, left shoulder: Secondary | ICD-10-CM | POA: Diagnosis not present

## 2023-03-24 ENCOUNTER — Other Ambulatory Visit: Payer: Self-pay | Admitting: Family Medicine

## 2023-03-24 DIAGNOSIS — G8929 Other chronic pain: Secondary | ICD-10-CM

## 2023-04-14 ENCOUNTER — Ambulatory Visit
Admission: RE | Admit: 2023-04-14 | Discharge: 2023-04-14 | Disposition: A | Discharge status: Home / Self Care | Payer: Medicare HMO | Source: Ambulatory Visit | Attending: Family Medicine | Admitting: Family Medicine

## 2023-04-14 DIAGNOSIS — M778 Other enthesopathies, not elsewhere classified: Secondary | ICD-10-CM | POA: Diagnosis not present

## 2023-04-14 DIAGNOSIS — Z1231 Encounter for screening mammogram for malignant neoplasm of breast: Secondary | ICD-10-CM | POA: Insufficient documentation

## 2023-04-14 DIAGNOSIS — M79601 Pain in right arm: Secondary | ICD-10-CM | POA: Diagnosis not present

## 2023-04-23 ENCOUNTER — Ambulatory Visit
Admission: RE | Admit: 2023-04-23 | Discharge: 2023-04-23 | Disposition: A | Discharge status: Home / Self Care | Payer: Medicare HMO | Source: Ambulatory Visit | Attending: Family | Admitting: Family

## 2023-04-23 ENCOUNTER — Other Ambulatory Visit: Payer: Self-pay | Admitting: Family

## 2023-04-23 DIAGNOSIS — M25511 Pain in right shoulder: Secondary | ICD-10-CM

## 2023-04-27 ENCOUNTER — Other Ambulatory Visit: Payer: Self-pay | Admitting: Family Medicine

## 2023-04-27 DIAGNOSIS — I10 Essential (primary) hypertension: Secondary | ICD-10-CM
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
## 2023-05-05 ENCOUNTER — Encounter: Payer: Self-pay | Admitting: Family Medicine

## 2023-05-05 ENCOUNTER — Ambulatory Visit (INDEPENDENT_AMBULATORY_CARE_PROVIDER_SITE_OTHER): Payer: Medicare HMO | Admitting: Family Medicine

## 2023-05-05 VITALS — BP 128/76 | HR 82 | Temp 97.8°F | Resp 16 | Ht 64.0 in | Wt 165.7 lb

## 2023-05-05 DIAGNOSIS — I700 Atherosclerosis of aorta: Secondary | ICD-10-CM

## 2023-05-05 DIAGNOSIS — D573 Sickle-cell trait: Secondary | ICD-10-CM | POA: Diagnosis not present

## 2023-05-05 DIAGNOSIS — G43009 Migraine without aura, not intractable, without status migrainosus: Secondary | ICD-10-CM

## 2023-05-05 DIAGNOSIS — K219 Gastro-esophageal reflux disease without esophagitis: Secondary | ICD-10-CM | POA: Diagnosis not present

## 2023-05-05 DIAGNOSIS — I10 Essential (primary) hypertension: Secondary | ICD-10-CM | POA: Diagnosis not present

## 2023-05-05 DIAGNOSIS — E1122 Type 2 diabetes mellitus with diabetic chronic kidney disease: Secondary | ICD-10-CM | POA: Diagnosis not present

## 2023-05-05 DIAGNOSIS — Z7985 Long-term (current) use of injectable non-insulin antidiabetic drugs: Secondary | ICD-10-CM

## 2023-05-05 DIAGNOSIS — N1831 Chronic kidney disease, stage 3a: Secondary | ICD-10-CM | POA: Diagnosis not present

## 2023-05-05 LAB — POCT GLYCOSYLATED HEMOGLOBIN (HGB A1C): Hemoglobin A1C: 6.1 % — AB (ref 4.0–5.6)

## 2023-05-05 MED ORDER — EZETIMIBE 10 MG PO TABS: 10.0000 mg | ORAL_TABLET | Freq: Every day | ORAL | 3 refills | Status: DC

## 2023-05-05 NOTE — Patient Instructions (Signed)

## 2023-05-05 NOTE — Addendum Note (Signed)
Addended by: Alba Cory F on: 05/05/2023 10:25 AM   Modules accepted: Orders

## 2023-05-26 ENCOUNTER — Ambulatory Visit: Payer: Self-pay | Admitting: *Deleted

## 2023-05-26 NOTE — Telephone Encounter (Signed)
  Chief Complaint: coughing up light green mucus, hears wheezing at night when laying down. Symptoms: above Frequency: For a week now Pertinent Negatives: Patient denies fever, sore throat. Disposition: [] ED /[x] Urgent Care (no appt availability in office) / [] Appointment(In office/virtual)/ []  [place] [facility]/ [] Home Care/ [] Refused Recommended Disposition /[] [place] [facility]/ []  Follow-up with PCP Additional Notes: No appts available with [facility].   She wants to try OTC medication first so I suggested she call the pharmacist where she gets her medications because she had questions about compatibility.   The pharmacist could recommend what she could take that is compatible with her medications.   She was agreeable to doing this. I cautioned her to go to the urgent care if the pharmacist's recommendations did not help with the wheezing, congestion and coughing or her symptoms became worse.   She verbalized understanding.

## 2023-05-26 NOTE — Telephone Encounter (Signed)
Reason for Disposition  [1] Continuous (nonstop) coughing interferes with work or school AND [2] no improvement using cough treatment per Care Advice  Answer Assessment - Initial Assessment Questions 1. ONSET: "When did the cough begin?"      Coughing up light green mucus and I can hear my self wheezing in my chest.     It's been going on a week now.   No fever.   2. SEVERITY: "How bad is the cough today?"      I can hear my self wheezing at night when I'm laying down. 3. SPUTUM: "Describe the color of your sputum" (none, dry cough; clear, white, yellow, green)     Light green 4. HEMOPTYSIS: "Are you coughing up any blood?" If so ask: "How much?" (flecks, streaks, tablespoons, etc.)     Not asked 5. DIFFICULTY BREATHING: "Are you having difficulty breathing?" If Yes, ask: "How bad is it?" (e.g., mild, moderate, severe)    - MILD: No SOB at rest, mild SOB with walking, speaks normally in sentences, can lie down, no retractions, pulse < 100.    - MODERATE: SOB at rest, SOB with minimal exertion and prefers to sit, cannot lie down flat, speaks in phrases, mild retractions, audible wheezing, pulse 100-120.    - SEVERE: Very SOB at rest, speaks in single words, struggling to breathe, sitting hunched forward, retractions, pulse > 120      Not asked 6. FEVER: "Do you have a fever?" If Yes, ask: "What is your temperature, how was it measured, and when did it start?"     No 7. CARDIAC HISTORY: "Do you have any history of heart disease?" (e.g., heart attack, congestive heart failure)      Not asked 8. LUNG HISTORY: "Do you have any history of lung disease?"  (e.g., pulmonary embolus, asthma, emphysema)     I smoke weed sometimes. 9. PE RISK FACTORS: "Do you have a history of blood clots?" (or: recent major surgery, recent prolonged travel, bedridden)     Not asked 10. OTHER SYMPTOMS: "Do you have any other symptoms?" (e.g., runny nose, wheezing, chest pain)       Wheezing, ear pain, coughing  11.  PREGNANCY: "Is there any chance you are pregnant?" "When was your last menstrual period?"       Not asked 12. TRAVEL: "Have you traveled out of the country in the last month?" (e.g., travel history, exposures)       Not asked  Protocols used: Cough - Acute Productive-A-AH

## 2023-06-07 ENCOUNTER — Ambulatory Visit: Payer: Self-pay | Admitting: *Deleted

## 2023-06-07 NOTE — Telephone Encounter (Signed)
  Chief Complaint: shortness of breath with coughing, wheezing Symptoms: continued cough productive clear to some green mucus noted. Thick and difficulty coughing up mucus. Taking mucinex DM per recommendations when seen at [facility] per patient. Noted heart rate elevated in the 120"s yesterday . Has not checked to day. Not getting better Frequency: couple of weeks  Pertinent Negatives: Patient denies chest pain no difficulty breathing no fever.  Disposition: [] ED /[] Urgent Care (no appt availability in office) / [x] Appointment(In office/virtual)/ []  [place] [facility]/ [] Home Care/ [] Refused Recommended Disposition /[] [place] [facility]/ []  Follow-up with PCP Additional Notes:  No appt with PCP available. Appt scheduled with E. Mecum, PA tomorrow at [facility]. Recommended UC /ED if sx worsen today .     Reason for Disposition  [1] MILD difficulty breathing (e.g., minimal/no SOB at rest, SOB with walking, pulse <100) AND [2] NEW-onset or WORSE than normal  Answer Assessment - Initial Assessment Questions 1. RESPIRATORY STATUS: "Describe your breathing?" (e.g., wheezing, shortness of breath, unable to speak, severe coughing)      SOB with coughing some wheezing  2. ONSET: "When did this breathing problem begin?"      Couple of weeks  3. PATTERN "Does the difficult breathing come and go, or has it been constant since it started?"      Comes and goes with coughing 4. SEVERITY: "How bad is your breathing?" (e.g., mild, moderate, severe)    - MILD: No SOB at rest, mild SOB with walking, speaks normally in sentences, can lie down, no retractions, pulse < 100.    - MODERATE: SOB at rest, SOB with minimal exertion and prefers to sit, cannot lie down flat, speaks in phrases, mild retractions, audible wheezing, pulse 100-120.    - SEVERE: Very SOB at rest, speaks in single words, struggling to breathe, sitting hunched forward, retractions, pulse > 120      Shortness of breath with coughing.  Reported elevated heart rate yesterday in the 120's. Unknown heart rate now but does not feel heart fluttering or elevated.  5. RECURRENT SYMPTOM: "Have you had difficulty breathing before?" If Yes, ask: "When was the last time?" and "What happened that time?"      Na  6. CARDIAC HISTORY: "Do you have any history of heart disease?" (e.g., heart attack, angina, bypass surgery, angioplasty)      Na  7. LUNG HISTORY: "Do you have any history of lung disease?"  (e.g., pulmonary embolus, asthma, emphysema)     no 8. CAUSE: "What do you think is causing the breathing problem?"      Not sure has been seen by PCP for this issue and medication not gotten rid of sx. 9. OTHER SYMPTOMS: "Do you have any other symptoms? (e.g., dizziness, runny nose, cough, chest pain, fever)     Cough clear production , shortness of breath with coughing heart fast and fluttering  10. O2 SATURATION MONITOR:  "Do you use an oxygen saturation monitor (pulse oximeter) at home?" If Yes, ask: "What is your reading (oxygen level) today?" "What is your usual oxygen saturation reading?" (e.g., 95%)       na 11. PREGNANCY: "Is there any chance you are pregnant?" "When was your last menstrual period?"       na 12. TRAVEL: "Have you traveled out of the country in the last month?" (e.g., travel history, exposures)       na  Protocols used: Breathing Difficulty-A-AH

## 2023-06-08 ENCOUNTER — Ambulatory Visit
Admission: RE | Admit: 2023-06-08 | Discharge: 2023-06-08 | Disposition: A | Discharge status: Home / Self Care | Payer: Medicare HMO | Attending: Physician Assistant | Admitting: Physician Assistant

## 2023-06-08 ENCOUNTER — Encounter: Payer: Self-pay | Admitting: Physician Assistant

## 2023-06-08 ENCOUNTER — Ambulatory Visit
Admission: RE | Admit: 2023-06-08 | Discharge: 2023-06-08 | Disposition: A | Discharge status: Home / Self Care | Payer: Medicare HMO | Source: Ambulatory Visit | Attending: Physician Assistant | Admitting: Physician Assistant

## 2023-06-08 ENCOUNTER — Ambulatory Visit (INDEPENDENT_AMBULATORY_CARE_PROVIDER_SITE_OTHER): Payer: Medicare HMO | Admitting: Physician Assistant

## 2023-06-08 VITALS — BP 118/66 | HR 76 | Resp 16 | Ht 64.0 in | Wt 166.0 lb

## 2023-06-08 DIAGNOSIS — R0981 Nasal congestion: Secondary | ICD-10-CM

## 2023-06-08 DIAGNOSIS — R0989 Other specified symptoms and signs involving the circulatory and respiratory systems: Secondary | ICD-10-CM

## 2023-06-08 DIAGNOSIS — R059 Cough, unspecified: Secondary | ICD-10-CM | POA: Diagnosis not present

## 2023-06-08 DIAGNOSIS — R058 Other specified cough: Secondary | ICD-10-CM | POA: Diagnosis not present

## 2023-06-08 MED ORDER — METHYLPREDNISOLONE 4 MG PO TBPK: ORAL_TABLET | Freq: Every day | ORAL | 0 refills | Status: DC

## 2023-06-08 NOTE — Patient Instructions (Addendum)
    You can take regular Mucinex formulation and Robitussin to help with your symptoms  Please go here for your  xray   [address] [place] [address] [place], [place] [address]   We will keep you updated with the results of your chest xray once it comes back.    I also recommend adding an antihistamine to your daily regimen This includes medications like Claritin, Allegra, Zyrtec- the generics of these work very well and are usually less expensive I recommend using Flonase nasal spray - 2 puffs twice per day to help with your nasal congestion The antihistamines and Flonase can take a few weeks to provide significant relief from allergy symptoms but should start to provide some benefit soon.

## 2023-06-08 NOTE — Progress Notes (Signed)
Your chest xray did not show evidence of pneumonia or other acute illness. Please proceed with the management therapies included in your AVS and let us know if you are not feeling better in 5-7 days

## 2023-06-08 NOTE — Progress Notes (Signed)
Acute Office Visit   Patient: Brandy Johnston   DOB: [birth year]   62 y.o. Female  MRN: [id number] Visit Date: 06/08/2023  Today's healthcare provider: Oswaldo Conroy Zamaria Brazzle, PA-C  Introduced myself to the patient as a Secondary school teacher and provided education on APPs in clinical practice.    Chief Complaint  Patient presents with   Wheezing    X2 weeks. Productive cough   Subjective    HPI HPI     Wheezing    Additional comments: X2 weeks. Productive cough      Last edited by Dollene Primrose, CMA on 06/08/2023  8:06 AM.       Ian Bushman Cough Onset: gradual  Duration: ongoing for about 2 weeks  She reports productive cough with thick whitish sputum - mostly occurs while laying down  She denies previous similar symptoms  Associated symptoms: nasal congestion, sore throat, SOB, wheezing, ear pain   Interventions: Mucinex DM- not much relief   Sick contacts: none to her knowledge  Recent travel: was on a cruise to [place] in April      Medications: Outpatient Medications Prior to Visit  Medication Sig   aspirin (ASPIRIN 81) 81 MG chewable tablet Chew 1 tablet (81 mg total) by mouth daily.   Blood Glucose Monitoring Suppl (CONTOUR NEXT ONE) KIT USE TO TEST BLOOD SUGAR ONCE D   Butalbital-APAP-Caffeine 50-300-40 MG CAPS Take 1 capsule by mouth every 4 (four) hours as needed.   Cholecalciferol (VITAMIN D) 2000 units CAPS Take 1 capsule (2,000 Units total) by mouth daily.   cyclobenzaprine (FLEXERIL) 5 MG tablet TAKE 1 TABLET(5 MG) BY MOUTH TWICE DAILY   diltiazem (CARDIZEM CD) 180 MG 24 hr capsule TAKE 1 CAPSULE(180 MG) BY MOUTH DAILY   ezetimibe (ZETIA) 10 MG tablet Take 1 tablet (10 mg total) by mouth daily.   famotidine (PEPCID) 20 MG tablet Take 1 tablet (20 mg total) by mouth 2 (two) times daily.   gabapentin (NEURONTIN) 300 MG capsule Take 1 capsule (300 mg total) by mouth 2 (two) times daily.   glucose blood test strip Use as instructed   loratadine (CLARITIN) 10 MG  tablet Take by mouth.   magnesium oxide (MAG-OX) 400 MG tablet Take 1 tablet (400 mg total) by mouth 2 (two) times daily.   Microlet Lancets MISC USE TO CHECK BLOOD SUGAR ONCE D   OZEMPIC, 0.25 OR 0.5 MG/DOSE, 2 MG/3ML SOPN SMARTSIG:0.5 Milligram(s) IM Once a Week   rosuvastatin (CRESTOR) 40 MG tablet Take 1 tablet (40 mg total) by mouth daily.   Semaglutide,0.25 or 0.5MG /DOS, (OZEMPIC, 0.25 OR 0.5 MG/DOSE,) 2 MG/1.5ML SOPN Inject 0.5 mg into the muscle every Monday.   traZODone (DESYREL) 50 MG tablet TAKE 1 TABLET(50 MG) BY MOUTH AT BEDTIME   triamcinolone cream (KENALOG) 0.1 % Apply 1 application topically 2 (two) times daily.   No facility-administered medications prior to visit.    Review of Systems  Constitutional:  Negative for chills and fever.  HENT:  Positive for congestion, ear pain and postnasal drip. Negative for sore throat.   Respiratory:  Positive for cough, shortness of breath and wheezing.   Gastrointestinal:  Negative for diarrhea, nausea and vomiting.  Musculoskeletal:  Negative for myalgias.       Objective    BP 118/66   Pulse 76   Resp 16   Ht 5\' 4"  (1.626 m)   Wt 166 lb (75.3 kg)   SpO2 98%  BMI 28.49 kg/m    Physical Exam Vitals reviewed.  Constitutional:      General: She is awake.     Appearance: Normal appearance. She is well-developed and well-groomed.  HENT:     Head: Normocephalic and atraumatic.     Right Ear: Hearing, tympanic membrane and ear canal normal.     Left Ear: Hearing, tympanic membrane and ear canal normal.     Mouth/Throat:     Lips: Pink.     Mouth: Mucous membranes are moist. No oral lesions.     Pharynx: Oropharynx is clear. Uvula midline. No oropharyngeal exudate or posterior oropharyngeal erythema.     Tonsils: No tonsillar exudate or tonsillar abscesses.  Cardiovascular:     Rate and Rhythm: Normal rate and regular rhythm.     Pulses: Normal pulses.          Radial pulses are 2+ on the right side and 2+ on the left  side.     Heart sounds: Normal heart sounds. No murmur heard.    No friction rub. No gallop.  Pulmonary:     Effort: Pulmonary effort is normal.     Breath sounds: Decreased air movement present. No wheezing, rhonchi or rales.  Musculoskeletal:     Right lower leg: No edema.     Left lower leg: No edema.  Lymphadenopathy:     Head:     Right side of head: No submental, submandibular or preauricular adenopathy.     Left side of head: No submental, submandibular or preauricular adenopathy.     Cervical:     Right cervical: No superficial or posterior cervical adenopathy.    Left cervical: No superficial or posterior cervical adenopathy.     Upper Body:     Right upper body: No supraclavicular adenopathy.     Left upper body: No supraclavicular adenopathy.  Neurological:     Mental Status: She is alert.  Psychiatric:        Behavior: Behavior is cooperative.       No results found for any visits on 06/08/23.  Assessment & Plan      No follow-ups on file.      Problem List Items Addressed This Visit   None Visit Diagnoses     Decreased breath sounds of both lungs    -  Primary Acute, new concern Patient reports SOB and productive coughing with nasal congestion and fatigue Suspect potential URI at this time CXR was negative for acute cardiopulmonary disease today Recommend she continues with OTC Mucinex, Robitussin, second generation antihistamine and Flonase to help with symptoms  Will provide Medrol dose pack to assist with SOB and perceived wheezing- A1c is in goal range per lab review Follow up as needed for persistent or progressing symptoms     Relevant Medications   methylPREDNISolone (MEDROL DOSEPAK) 4 MG TBPK tablet   Other Relevant Orders   DG Chest 2 View (Completed)   Nasal congestion       Productive cough            No follow-ups on file.   I, Ival Pacer E Lura Falor, PA-C, have reviewed all documentation for this visit. The documentation on 06/08/23 for  the exam, diagnosis, procedures, and orders are all accurate and complete.   Jacquelin Hawking, MHS, PA-C [facility]

## 2023-06-22 ENCOUNTER — Ambulatory Visit: Payer: Self-pay | Admitting: *Deleted

## 2023-06-22 NOTE — Telephone Encounter (Signed)
Tried to call pt to get her scheduled. No answer VM not setup

## 2023-06-22 NOTE — Telephone Encounter (Signed)
2nd call attempted to contact patient #[phone] to review medication clarification. No answer. LVMTCB [phone].

## 2023-06-22 NOTE — Telephone Encounter (Signed)
Summary: Medication question   Patient states that she should be taking gabapentin (NEURONTIN) 300 MG capsule 3x daily. Rx in chart says 2x daily. Please advise what dose patient should be taking.      Called patient [phone] to review medication questions. No answer, LVMTCB [phone].

## 2023-06-22 NOTE — Telephone Encounter (Signed)
  Chief Complaint: Medication Question Symptoms: NA Frequency: NA Pertinent Negatives: Patient denies NA Disposition: [] ED /[] Urgent Care (no appt availability in office) / [] Appointment(In office/virtual)/ []  [place] [facility]/ [] Home Care/ [] Refused Recommended Disposition /[] [place] [facility]/ [x]  Follow-up with PCP Additional Notes:  Pt states she just noted Gabapentin bottle reads 300mg  2x daily. States she has always taken 300mg  3x daily. Noted on med profile in 2016 historical provider ordered 3x daily dosage, all other refills 2 times daily. . Pt states she is out of med "I have never taken twice a day, always 3 times a day." Also states she is due for shoulder injection with Dr. Roney Mans in [place]. Pt thinks she needs referral. Assured pt NT would route to practice for PCPs review. Please advise.  Reason for Disposition  [1] Caller has NON-URGENT medicine question about med that PCP prescribed AND [2] triager unable to answer question  Answer Assessment - Initial Assessment Questions 1. NAME of MEDICINE: "What medicine(s) are you calling about?"     Gabapentin 2. QUESTION: "What is your question?" (e.g., double dose of medicine, side effect)    Dosage 3. PRESCRIBER: "Who prescribed the medicine?" Reason: if prescribed by specialist, call should be referred to that group.    PCP  Protocols used: Medication Question Call-A-AH

## 2023-06-23 ENCOUNTER — Other Ambulatory Visit: Payer: Self-pay | Admitting: Family Medicine

## 2023-06-23 DIAGNOSIS — G8929 Other chronic pain: Secondary | ICD-10-CM

## 2023-06-23 DIAGNOSIS — E1142 Type 2 diabetes mellitus with diabetic polyneuropathy: Secondary | ICD-10-CM

## 2023-06-23 NOTE — Telephone Encounter (Signed)
Medication Refill - Medication: gabapentin (NEURONTIN) 300 MG capsule [[id number]]   (Totally out of medication)   Has the patient contacted their pharmacy? Yes.   ( Preferred Pharmacy (with phone number or street name):  [id number] - [place], [place] - [address] AT [facility] OF [address]  [address] [place] [address]  Phone: [phone] Fax: [phone]  Hours: Not open 24 hours     Has the patient been seen for an appointment in the last year OR does the patient have an upcoming appointment? Yes.    Agent: Please be advised that RX refills may take up to 3 business days. We ask that you follow-up with your pharmacy.

## 2023-06-24 NOTE — Telephone Encounter (Signed)
Unable to refill per protocol, Rx request is too soon. Last refill 02/09/23 for 90 and 3 refills.  Requested Prescriptions  Pending Prescriptions Disp Refills   gabapentin (NEURONTIN) 300 MG capsule 180 capsule 3    Sig: Take 1 capsule (300 mg total) by mouth 2 (two) times daily.     Neurology: Anticonvulsants - gabapentin Failed - 06/23/2023  2:20 PM      Failed - Cr in normal range and within 360 days    Creat  Date Value Ref Range Status  12/30/2022 1.16 (H) 0.50 - 1.05 mg/dL Final   Creatinine, Urine  Date Value Ref Range Status  11/09/2022 181 20 - 275 mg/dL Final         Passed - Completed PHQ-2 or PHQ-9 in the last 360 days      Passed - Valid encounter within last 12 months    Recent Outpatient Visits           2 weeks ago Decreased breath sounds of both lungs   [place] [facility] Mecum, Erin E, PA-C   1 month ago Type 2 diabetes mellitus with stage 3a chronic kidney disease, without long-term current use of insulin (HCC)   [place] [facility] Alba Cory, MD   5 months ago Encounter for [employer] annual wellness exam   [place] [facility] Mecum, Oswaldo Conroy, PA-C   5 months ago Type 2 diabetes mellitus with stage 3a chronic kidney disease, without long-term current use of insulin [facility])   [facility] Alba Cory, MD   6 months ago Well adult exam   [facility] Alba Cory, MD       Future Appointments             In 1 month Carlynn Purl, Danna Hefty, MD [facility]   In 2 months Alba Cory, MD [facility]

## 2023-06-24 NOTE — Telephone Encounter (Signed)
Pt called requesting update and refill of pain medication. Pt states that she had always taken gabapentin 3 times a day. Pt requests that this medication be refilled asap, as she is in pain. Pt is requesting call back.

## 2023-06-24 NOTE — Telephone Encounter (Signed)
Pt called saying she has still been taking the gabapentin 3 times a day.  She did nto know the rx was changed to twice a day.  She is out and needs something for pain.  [phone]

## 2023-07-07 ENCOUNTER — Other Ambulatory Visit: Payer: Self-pay | Admitting: Family Medicine

## 2023-07-07 DIAGNOSIS — E1142 Type 2 diabetes mellitus with diabetic polyneuropathy: Secondary | ICD-10-CM

## 2023-07-23 ENCOUNTER — Ambulatory Visit: Payer: Medicare HMO | Admitting: Family Medicine

## 2023-07-27 NOTE — Progress Notes (Unsigned)
Name: Brandy Johnston   MRN: [id number]    DOB: [birth year]   Date:07/28/2023       Progress Note  Subjective  Chief Complaint  Medication  HPI  Chronic low back pain with radiculitis/diabetes peripheral neuropathy/chronic left shoulder pain: she used to take high dose Gabapentin - 5 capsules per day and when switched back to our office she thought she was getting three per day and ran out of prescription. She has been taking 2 capsules in am and one in pm, pain level is 5/10, tolerable. She is due to have another injection on left shoulder by Ortho. Back pain is described as constant , aching like and sometimes shoots down left leg more than right side. She states diabetic neuropathy is under control, mild tingling on feet intermittently   Eczema: usually dry patches on legs sometimes on trunk , needs refill of triamcinolone today   Patient Active Problem List   Diagnosis Date Noted   Chronic left shoulder pain 11/09/2022   Hyperlipidemia 01/14/2021   Incomplete tear of left rotator cuff 07/03/2020   Angioedema due to angiotensin converting enzyme inhibitor (ACE-I) 01/29/2020   Chronic bilateral back pain 07/26/2017   Coronary artery calcification 05/17/2017   Heart palpitations 05/15/2017   History of acute gastritis    Leukocytosis 09/30/2016   Atherosclerosis of abdominal aorta (HCC) 08/18/2016   Type 2 diabetes mellitus with stage 3 chronic kidney disease, without long-term current use of insulin (HCC) 12/16/2015   Diabetic neuropathy associated with type 2 diabetes mellitus (HCC) 09/18/2015   Insomnia 09/18/2015   Benign essential HTN 06/18/2015   Chronic kidney disease (CKD), stage III (moderate) (HCC) 06/18/2015   Diabetes mellitus with renal manifestation (HCC) 06/18/2015   Obesity (BMI 30.0-34.9) 06/18/2015   Degenerative arthritis of hip 06/18/2015   Sickle cell trait (HCC) 06/18/2015   Dyslipidemia 05/27/2010   Gastro-esophageal reflux disease without esophagitis  05/25/2008    Past Surgical History:  Procedure Laterality Date   BREAST BIOPSY Right 12/28/2019   stereo [facility] stromal fibrosis   CHOLECYSTECTOMY     COLONOSCOPY     ESOPHAGOGASTRODUODENOSCOPY (EGD) WITH PROPOFOL N/A 10/05/2016   Procedure: ESOPHAGOGASTRODUODENOSCOPY (EGD) WITH PROPOFOL;  Surgeon: Midge Minium, MD;  Location: [facility];  Service: Endoscopy;  Laterality: N/A;   FRACTURE SURGERY Left    cast and pins    SHOULDER ARTHROSCOPY WITH ROTATOR CUFF REPAIR AND SUBACROMIAL DECOMPRESSION Left 12/07/2018   Procedure: left shoulder manipulation under anesthesia, left shoulder arthroscopic lysis of adhesions;  Surgeon: Lyndle Herrlich, MD;  Location: [facility];  Service: Orthopedics;  Laterality: Left;   SHOULDER CLOSED REDUCTION Left 12/07/2018   Procedure: CLOSED MANIPULATION SHOULDER;  Surgeon: Lyndle Herrlich, MD;  Location: [facility];  Service: Orthopedics;  Laterality: Left;   shoulder surgery  Left 06/10/2018   Dr. Derryl Harbor   TUBAL LIGATION      Family History  Problem Relation Age of Onset   Migraines Mother    Diabetes Mother    Cancer Mother        lung   Arthritis Brother    Breast cancer Maternal Aunt    Cancer Maternal Uncle        Lung and Colon   Cirrhosis Brother    Breast cancer Cousin     Social History   Tobacco Use   Smoking status: Never   Smokeless tobacco: Never  Substance Use Topics   Alcohol use: Yes    Alcohol/week: 0.0 standard drinks of alcohol  Comment: rare     Current Outpatient Medications:    aspirin (ASPIRIN 81) 81 MG chewable tablet, Chew 1 tablet (81 mg total) by mouth daily., Disp: 30 tablet, Rfl: 0   Blood Glucose Monitoring Suppl (CONTOUR NEXT ONE) KIT, USE TO TEST BLOOD SUGAR ONCE D, Disp: , Rfl:    Cholecalciferol (VITAMIN D) 2000 units CAPS, Take 1 capsule (2,000 Units total) by mouth daily., Disp: 30 capsule, Rfl: 0   cyclobenzaprine (FLEXERIL) 5 MG tablet, TAKE 1 TABLET(5 MG) BY MOUTH TWICE DAILY, Disp: 180 tablet,  Rfl: 0   diltiazem (CARDIZEM CD) 180 MG 24 hr capsule, TAKE 1 CAPSULE(180 MG) BY MOUTH DAILY, Disp: 90 capsule, Rfl: 0   ezetimibe (ZETIA) 10 MG tablet, Take 1 tablet (10 mg total) by mouth daily., Disp: 90 tablet, Rfl: 3   famotidine (PEPCID) 20 MG tablet, Take 1 tablet (20 mg total) by mouth 2 (two) times daily., Disp: 180 tablet, Rfl: 1   gabapentin (NEURONTIN) 300 MG capsule, Take 1 capsule (300 mg total) by mouth 2 (two) times daily., Disp: 180 capsule, Rfl: 3   glucose blood test strip, Use as instructed, Disp: 100 each, Rfl: 12   loratadine (CLARITIN) 10 MG tablet, Take by mouth., Disp: , Rfl:    magnesium oxide (MAG-OX) 400 MG tablet, Take 1 tablet (400 mg total) by mouth 2 (two) times daily., Disp: 180 tablet, Rfl: 1   Microlet Lancets MISC, USE TO CHECK BLOOD SUGAR ONCE D, Disp: , Rfl:    rosuvastatin (CRESTOR) 40 MG tablet, Take 1 tablet (40 mg total) by mouth daily., Disp: 90 tablet, Rfl: 3   Semaglutide,0.25 or 0.5MG /DOS, (OZEMPIC, 0.25 OR 0.5 MG/DOSE,) 2 MG/3ML SOPN, INJECT 0.5 MG INTO THE MUSCLE EVERY MONDAY, Disp: 9 mL, Rfl: 0   traZODone (DESYREL) 50 MG tablet, TAKE 1 TABLET(50 MG) BY MOUTH AT BEDTIME, Disp: 90 tablet, Rfl: 0   triamcinolone cream (KENALOG) 0.1 %, Apply 1 application topically 2 (two) times daily., Disp: 30 g, Rfl: 0   Butalbital-APAP-Caffeine 50-300-40 MG CAPS, Take 1 capsule by mouth every 4 (four) hours as needed. (Patient not taking: Reported on 07/28/2023), Disp: 40 capsule, Rfl: 0  Allergies  Allergen Reactions   Ace Inhibitors Swelling    Angioedema    Lisinopril Swelling    Face and neck swelling    I personally reviewed active problem list, medication list, allergies, family history, social history, health maintenance with the patient/caregiver today.   ROS  Ten systems reviewed and is negative except as mentioned in HPI    Objective  Vitals:   07/28/23 0846  BP: 120/70  Pulse: 72  Resp: 14  Temp: 98.1 F (36.7 C)  TempSrc: Oral  SpO2:  99%  Weight: 161 lb 8 oz (73.3 kg)  Height: 5\' 4"  (1.626 m)    Body mass index is 27.72 kg/m.  Physical Exam  Constitutional: Patient appears well-developed and well-nourished. Obese  No distress.  HEENT: head atraumatic, normocephalic, pupils equal and reactive to light, neck supple Cardiovascular: Normal rate, regular rhythm and normal heart sounds.  No murmur heard. No BLE edema. Pulmonary/Chest: Effort normal and breath sounds normal. No respiratory distress. Abdominal: Soft.  There is no tenderness. Skin : eczematous patches on left lower leg  Muscular skeletal: pain with rom of left shoulder, tender during palpation of lumbar spine, negative straight leg raise Psychiatric: Patient has a normal mood and affect. behavior is normal. Judgment and thought content normal.   Recent Results (from the past  2160 hour(s))  POCT HgB A1C     Status: Abnormal   Collection Time: 05/05/23  9:54 AM  Result Value Ref Range   Hemoglobin A1C 6.1 (A) 4.0 - 5.6 %   HbA1c POC (<> result, manual entry)     HbA1c, POC (prediabetic range)     HbA1c, POC (controlled diabetic range)      PHQ2/9:    07/28/2023    8:49 AM 06/08/2023    8:01 AM 05/05/2023    9:53 AM 01/19/2023    8:07 AM 12/30/2022    9:07 AM  Depression screen PHQ 2/9  Decreased Interest 0 0 0 0 0  Down, Depressed, Hopeless 0 0 0 0 0  PHQ - 2 Score 0 0 0 0 0  Altered sleeping 0 0 0 0 0  Tired, decreased energy 0 0 0 0 0  Change in appetite 0 0 0 0 0  Feeling bad or failure about yourself  0 0 0 0 0  Trouble concentrating 0 0 0 0 0  Moving slowly or fidgety/restless 0 0 0 0 0  Suicidal thoughts 0 0 0 0 0  PHQ-9 Score 0 0 0 0 0  Difficult doing work/chores    Not difficult at all     phq 9 is negative   Fall Risk:    07/28/2023    8:49 AM 06/08/2023    8:00 AM 05/05/2023    9:53 AM 01/19/2023    8:06 AM 12/30/2022    9:07 AM  Fall Risk   Falls in the past year? 0 0 0 0 0  Number falls in past yr:  0  0 0  Injury with Fall?   0  0 0  Risk for fall due to : No Fall Risks No Fall Risks No Fall Risks No Fall Risks No Fall Risks  Follow up Falls prevention discussed Falls prevention discussed Falls prevention discussed Falls prevention discussed;Education provided;Falls evaluation completed Falls prevention discussed      Functional Status Survey: Is the patient deaf or have difficulty hearing?: No Does the patient have difficulty seeing, even when wearing glasses/contacts?: No Does the patient have difficulty concentrating, remembering, or making decisions?: No Does the patient have difficulty walking or climbing stairs?: No Does the patient have difficulty dressing or bathing?: No Does the patient have difficulty doing errands alone such as visiting a doctor's office or shopping?: No    Assessment & Plan  1. Diabetic polyneuropathy associated with type 2 diabetes mellitus (HCC)  - gabapentin (NEURONTIN) 300 MG capsule; Take 1 capsule (300 mg total) by mouth 3 (three) times daily.  Dispense: 270 capsule; Refill: 1  2. Chronic bilateral low back pain with bilateral sciatica  - gabapentin (NEURONTIN) 300 MG capsule; Take 1 capsule (300 mg total) by mouth 3 (three) times daily.  Dispense: 270 capsule; Refill: 1  3. Chronic left shoulder pain  - diclofenac Sodium (VOLTAREN) 1 % GEL; Apply 2 g topically 4 (four) times daily.  Dispense: 100 g; Refill: 2  4. Rash in adult  - triamcinolone cream (KENALOG) 0.1 %; Apply 1 Application topically 2 (two) times daily.  Dispense: 453.6 g; Refill: 0

## 2023-07-28 ENCOUNTER — Ambulatory Visit: Payer: Medicare HMO | Admitting: Family Medicine

## 2023-07-28 ENCOUNTER — Encounter: Payer: Self-pay | Admitting: Family Medicine

## 2023-07-28 ENCOUNTER — Ambulatory Visit (INDEPENDENT_AMBULATORY_CARE_PROVIDER_SITE_OTHER): Payer: Medicare HMO | Admitting: Family Medicine

## 2023-07-28 VITALS — BP 120/70 | HR 72 | Temp 98.1°F | Resp 14 | Ht 64.0 in | Wt 161.5 lb

## 2023-07-28 DIAGNOSIS — M25512 Pain in left shoulder: Secondary | ICD-10-CM

## 2023-07-28 DIAGNOSIS — M5441 Lumbago with sciatica, right side: Secondary | ICD-10-CM

## 2023-07-28 DIAGNOSIS — G8929 Other chronic pain: Secondary | ICD-10-CM

## 2023-07-28 DIAGNOSIS — E1142 Type 2 diabetes mellitus with diabetic polyneuropathy: Secondary | ICD-10-CM

## 2023-07-28 DIAGNOSIS — R21 Rash and other nonspecific skin eruption: Secondary | ICD-10-CM

## 2023-07-28 DIAGNOSIS — M5442 Lumbago with sciatica, left side: Secondary | ICD-10-CM | POA: Diagnosis not present

## 2023-07-28 DIAGNOSIS — Z7985 Long-term (current) use of injectable non-insulin antidiabetic drugs: Secondary | ICD-10-CM | POA: Diagnosis not present

## 2023-07-28 MED ORDER — TRIAMCINOLONE ACETONIDE 0.1 % EX CREA: 1.0000 | TOPICAL_CREAM | Freq: Two times a day (BID) | CUTANEOUS | 0 refills | Status: DC

## 2023-07-28 MED ORDER — DICLOFENAC SODIUM 1 % EX GEL: 2.0000 g | Freq: Four times a day (QID) | CUTANEOUS | 2 refills | Status: AC

## 2023-07-28 MED ORDER — GABAPENTIN 300 MG PO CAPS: 300.0000 mg | ORAL_CAPSULE | Freq: Three times a day (TID) | ORAL | 1 refills | Status: DC

## 2023-08-06 ENCOUNTER — Other Ambulatory Visit: Payer: Self-pay | Admitting: Family Medicine

## 2023-08-06 ENCOUNTER — Ambulatory Visit (INDEPENDENT_AMBULATORY_CARE_PROVIDER_SITE_OTHER): Payer: Medicare HMO | Admitting: Family Medicine

## 2023-08-06 ENCOUNTER — Ambulatory Visit: Payer: Self-pay | Admitting: *Deleted

## 2023-08-06 ENCOUNTER — Encounter: Payer: Self-pay | Admitting: Family Medicine

## 2023-08-06 ENCOUNTER — Ambulatory Visit
Admission: RE | Admit: 2023-08-06 | Discharge: 2023-08-06 | Disposition: A | Discharge status: Home / Self Care | Payer: Medicare HMO | Source: Ambulatory Visit | Attending: Family Medicine | Admitting: Family Medicine

## 2023-08-06 ENCOUNTER — Other Ambulatory Visit
Admission: RE | Admit: 2023-08-06 | Discharge: 2023-08-06 | Disposition: A | Discharge status: Home / Self Care | Payer: Medicare HMO | Source: Home / Self Care | Attending: Family Medicine | Admitting: Family Medicine

## 2023-08-06 ENCOUNTER — Ambulatory Visit
Admission: RE | Admit: 2023-08-06 | Discharge: 2023-08-06 | Disposition: A | Discharge status: Home / Self Care | Payer: Medicare HMO | Attending: Family Medicine | Admitting: Family Medicine

## 2023-08-06 VITALS — BP 128/70 | HR 85 | Resp 16 | Ht 64.0 in | Wt 161.0 lb

## 2023-08-06 DIAGNOSIS — R0601 Orthopnea: Secondary | ICD-10-CM

## 2023-08-06 DIAGNOSIS — R062 Wheezing: Secondary | ICD-10-CM

## 2023-08-06 DIAGNOSIS — R059 Cough, unspecified: Secondary | ICD-10-CM | POA: Diagnosis not present

## 2023-08-06 LAB — COMPREHENSIVE METABOLIC PANEL
ALT: 16 U/L (ref 0–44)
AST: 21 U/L (ref 15–41)
Albumin: 4.1 g/dL (ref 3.5–5.0)
Alkaline Phosphatase: 90 U/L (ref 38–126)
Anion gap: 9 (ref 5–15)
BUN: 14 mg/dL (ref 8–23)
CO2: 26 mmol/L (ref 22–32)
Calcium: 9.4 mg/dL (ref 8.9–10.3)
Chloride: 107 mmol/L (ref 98–111)
Creatinine, Ser: 1.2 mg/dL — ABNORMAL HIGH (ref 0.44–1.00)
GFR, Estimated: 51 mL/min — ABNORMAL LOW (ref >60)
Glucose, Bld: 88 mg/dL (ref 70–99)
Potassium: 3.8 mmol/L (ref 3.5–5.1)
Sodium: 142 mmol/L (ref 135–145)
Total Bilirubin: 0.7 mg/dL (ref 0.3–1.2)
Total Protein: 8.1 g/dL (ref 6.5–8.1)

## 2023-08-06 LAB — CBC WITH DIFFERENTIAL/PLATELET
Abs Immature Granulocytes: 0.04 10*3/uL (ref 0.00–0.07)
Basophils Absolute: 0.1 10*3/uL (ref 0.0–0.1)
Basophils Relative: 1 %
Eosinophils Absolute: 0.6 10*3/uL — ABNORMAL HIGH (ref 0.0–0.5)
Eosinophils Relative: 6 %
HCT: 40.3 % (ref 36.0–46.0)
Hemoglobin: 12.9 g/dL (ref 12.0–15.0)
Immature Granulocytes: 0 %
Lymphocytes Relative: 30 %
Lymphs Abs: 2.9 10*3/uL (ref 0.7–4.0)
MCH: 26.2 pg (ref 26.0–34.0)
MCHC: 32 g/dL (ref 30.0–36.0)
MCV: 81.9 fL (ref 80.0–100.0)
Monocytes Absolute: 0.6 10*3/uL (ref 0.1–1.0)
Monocytes Relative: 6 %
Neutro Abs: 5.5 10*3/uL (ref 1.7–7.7)
Neutrophils Relative %: 57 %
Platelets: 176 10*3/uL (ref 150–400)
RBC: 4.92 MIL/uL (ref 3.87–5.11)
RDW: 15.6 % — ABNORMAL HIGH (ref 11.5–15.5)
WBC: 9.6 10*3/uL (ref 4.0–10.5)
nRBC: 0 % (ref 0.0–0.2)

## 2023-08-06 LAB — BRAIN NATRIURETIC PEPTIDE: B Natriuretic Peptide: 20.9 pg/mL (ref 0.0–100.0)

## 2023-08-06 MED ORDER — PREDNISONE 10 MG PO TABS: 10.0000 mg | ORAL_TABLET | Freq: Two times a day (BID) | ORAL | 0 refills | Status: DC

## 2023-08-06 NOTE — Telephone Encounter (Signed)
Reason for Disposition  [1] MILD difficulty breathing (e.g., minimal/no SOB at rest, SOB with walking, pulse <100) AND [2] NEW-onset or WORSE than normal  Answer Assessment - Initial Assessment Questions 1. RESPIRATORY STATUS: "Describe your breathing?" (e.g., wheezing, shortness of breath, unable to speak, severe coughing)      Wheezing when lays down, coughing up phelm  2. ONSET: "When did this breathing problem begin?"      3 days 3. PATTERN "Does the difficult breathing come and go, or has it been constant since it started?"      Comes and goes- mainly with laying 4. SEVERITY: "How bad is your breathing?" (e.g., mild, moderate, severe)    - MILD: No SOB at rest, mild SOB with walking, speaks normally in sentences, can lie down, no retractions, pulse < 100.    - MODERATE: SOB at rest, SOB with minimal exertion and prefers to sit, cannot lie down flat, speaks in phrases, mild retractions, audible wheezing, pulse 100-120.    - SEVERE: Very SOB at rest, speaks in single words, struggling to breathe, sitting hunched forward, retractions, pulse > 120      Mild- wheezing 5. RECURRENT SYMPTOM: "Have you had difficulty breathing before?" If Yes, ask: "When was the last time?" and "What happened that time?"      Just treated with prednisone- did get better 6. CARDIAC HISTORY: "Do you have any history of heart disease?" (e.g., heart attack, angina, bypass surgery, angioplasty)      no 7. LUNG HISTORY: "Do you have any history of lung disease?"  (e.g., pulmonary embolus, asthma, emphysema)     no 8. CAUSE: "What do you think is causing the breathing problem?"      unsure 9. OTHER SYMPTOMS: "Do you have any other symptoms? (e.g., dizziness, runny nose, cough, chest pain, fever)     Cough- pale white, thick sputum, slight nasal congetsion  12. TRAVEL: "Have you traveled out of the country in the last month?" (e.g., travel history, exposures)       No- getting ready to go on trip in a  week  Protocols used: Breathing Difficulty-A-AH

## 2023-08-06 NOTE — Telephone Encounter (Signed)
  Chief Complaint: wheezing with laying down, cough Symptoms: patient reports she was recently treated with prednisone for URI- she did get better and symptoms cleared. Patient reports for the last 3 days- she has had cough and wheezing at night when she lays down.  Frequency: 3 days Pertinent Negatives: Patient denies dizziness, runny nose, cough, chest pain, fever  Disposition: [] ED /[] Urgent Care (no appt availability in office) / [] Appointment(In office/virtual)/ []  [place] [facility]/ [] Home Care/ [x] Refused Recommended Disposition /[] [place] [facility]/ []  Follow-up with PCP Additional Notes: Patient offered appointment with another provider- patient declines- only wants PCP- patient is requesting medication for symptoms or appointment with PCP.

## 2023-08-06 NOTE — Progress Notes (Signed)
Name: Brandy Johnston   MRN: [id number]    DOB: 08-Jun-1961   Date:08/06/2023       Progress Note  Subjective  Chief Complaint  Congestion  HPI  Wheezing: she states symptoms started suddenly three days ago. Initially just wheezing, mild cough and throat discomfort. No rhinorrhea or nasal congestion. She has some right ear pain. She has worsening of wheezing when laying down , also mild orthopnea. No leg swelling.   Patient Active Problem List   Diagnosis Date Noted   Chronic left shoulder pain 11/09/2022   Hyperlipidemia 01/14/2021   Incomplete tear of left rotator cuff 07/03/2020   Angioedema due to angiotensin converting enzyme inhibitor (ACE-I) 01/29/2020   Chronic bilateral back pain 07/26/2017   Coronary artery calcification 05/17/2017   Heart palpitations 05/15/2017   History of acute gastritis    Leukocytosis 09/30/2016   Atherosclerosis of abdominal aorta (HCC) 08/18/2016   Type 2 diabetes mellitus with stage 3 chronic kidney disease, without long-term current use of insulin (HCC) 12/16/2015   Diabetic neuropathy associated with type 2 diabetes mellitus (HCC) 09/18/2015   Insomnia 09/18/2015   Benign essential HTN 06/18/2015   Chronic kidney disease (CKD), stage III (moderate) (HCC) 06/18/2015   Diabetes mellitus with renal manifestation (HCC) 06/18/2015   Obesity (BMI 30.0-34.9) 06/18/2015   Degenerative arthritis of hip 06/18/2015   Sickle cell trait (HCC) 06/18/2015   Dyslipidemia 05/27/2010   Gastro-esophageal reflux disease without esophagitis 05/25/2008    Past Surgical History:  Procedure Laterality Date   BREAST BIOPSY Right 12/28/2019   stereo [facility] stromal fibrosis   CHOLECYSTECTOMY     COLONOSCOPY     ESOPHAGOGASTRODUODENOSCOPY (EGD) WITH PROPOFOL N/A 10/05/2016   Procedure: ESOPHAGOGASTRODUODENOSCOPY (EGD) WITH PROPOFOL;  Surgeon: Midge Minium, MD;  Location: [facility];  Service: Endoscopy;  Laterality: N/A;   FRACTURE SURGERY Left    cast and pins     SHOULDER ARTHROSCOPY WITH ROTATOR CUFF REPAIR AND SUBACROMIAL DECOMPRESSION Left 12/07/2018   Procedure: left shoulder manipulation under anesthesia, left shoulder arthroscopic lysis of adhesions;  Surgeon: Lyndle Herrlich, MD;  Location: [facility];  Service: Orthopedics;  Laterality: Left;   SHOULDER CLOSED REDUCTION Left 12/07/2018   Procedure: CLOSED MANIPULATION SHOULDER;  Surgeon: Lyndle Herrlich, MD;  Location: [facility];  Service: Orthopedics;  Laterality: Left;   shoulder surgery  Left 06/10/2018   Dr. Derryl Harbor   TUBAL LIGATION      Family History  Problem Relation Age of Onset   Migraines Mother    Diabetes Mother    Cancer Mother        lung   Arthritis Brother    Breast cancer Maternal Aunt    Cancer Maternal Uncle        Lung and Colon   Cirrhosis Brother    Breast cancer Cousin     Social History   Tobacco Use   Smoking status: Never   Smokeless tobacco: Never  Substance Use Topics   Alcohol use: Yes    Alcohol/week: 0.0 standard drinks of alcohol    Comment: rare     Current Outpatient Medications:    aspirin (ASPIRIN 81) 81 MG chewable tablet, Chew 1 tablet (81 mg total) by mouth daily., Disp: 30 tablet, Rfl: 0   Blood Glucose Monitoring Suppl (CONTOUR NEXT ONE) KIT, USE TO TEST BLOOD SUGAR ONCE D, Disp: , Rfl:    Cholecalciferol (VITAMIN D) 2000 units CAPS, Take 1 capsule (2,000 Units total) by mouth daily., Disp: 30 capsule,  Rfl: 0   cyclobenzaprine (FLEXERIL) 5 MG tablet, TAKE 1 TABLET(5 MG) BY MOUTH TWICE DAILY, Disp: 180 tablet, Rfl: 0   diclofenac Sodium (VOLTAREN) 1 % GEL, Apply 2 g topically 4 (four) times daily., Disp: 100 g, Rfl: 2   diltiazem (CARDIZEM CD) 180 MG 24 hr capsule, TAKE 1 CAPSULE(180 MG) BY MOUTH DAILY, Disp: 90 capsule, Rfl: 0   ezetimibe (ZETIA) 10 MG tablet, Take 1 tablet (10 mg total) by mouth daily., Disp: 90 tablet, Rfl: 3   famotidine (PEPCID) 20 MG tablet, Take 1 tablet (20 mg total) by mouth 2 (two) times daily., Disp: 180  tablet, Rfl: 1   gabapentin (NEURONTIN) 300 MG capsule, Take 1 capsule (300 mg total) by mouth 3 (three) times daily., Disp: 270 capsule, Rfl: 1   glucose blood test strip, Use as instructed, Disp: 100 each, Rfl: 12   loratadine (CLARITIN) 10 MG tablet, Take by mouth., Disp: , Rfl:    magnesium oxide (MAG-OX) 400 MG tablet, Take 1 tablet (400 mg total) by mouth 2 (two) times daily., Disp: 180 tablet, Rfl: 1   Microlet Lancets MISC, USE TO CHECK BLOOD SUGAR ONCE D, Disp: , Rfl:    rosuvastatin (CRESTOR) 40 MG tablet, Take 1 tablet (40 mg total) by mouth daily., Disp: 90 tablet, Rfl: 3   Semaglutide,0.25 or 0.5MG /DOS, (OZEMPIC, 0.25 OR 0.5 MG/DOSE,) 2 MG/3ML SOPN, INJECT 0.5 MG INTO THE MUSCLE EVERY MONDAY, Disp: 9 mL, Rfl: 0   traZODone (DESYREL) 50 MG tablet, TAKE 1 TABLET(50 MG) BY MOUTH AT BEDTIME, Disp: 90 tablet, Rfl: 0   triamcinolone cream (KENALOG) 0.1 %, Apply 1 Application topically 2 (two) times daily., Disp: 453.6 g, Rfl: 0   Butalbital-APAP-Caffeine 50-300-40 MG CAPS, Take 1 capsule by mouth every 4 (four) hours as needed. (Patient not taking: Reported on 07/28/2023), Disp: 40 capsule, Rfl: 0  Allergies  Allergen Reactions   Ace Inhibitors Swelling    Angioedema    Lisinopril Swelling    Face and neck swelling    I personally reviewed active problem list, medication list, allergies, family history, social history, health maintenance with the patient/caregiver today.   ROS  Ten systems reviewed and is negative except as mentioned in HPI    Objective  Vitals:   08/06/23 1403  BP: 128/70  Pulse: 85  Resp: 16  SpO2: 98%  Weight: 161 lb (73 kg)  Height: 5\' 4"  (1.626 m)    Body mass index is 27.64 kg/m.  Physical Exam  Constitutional: Patient appears well-developed and well-nourished. Obese  No distress.  HEENT: head atraumatic, normocephalic, pupils equal and reactive to light, ears normal TM, neck supple Cardiovascular: Normal rate, regular rhythm and normal heart  sounds.  No murmur heard. No BLE edema. Pulmonary/Chest: Effort normal  mild coarse sounds on bases l. No respiratory distress. Abdominal: Soft.  There is no tenderness. Psychiatric: Patient has a normal mood and affect. behavior is normal. Judgment and thought content normal.    PHQ2/9:    08/06/2023    2:02 PM 07/28/2023    8:49 AM 06/08/2023    8:01 AM 05/05/2023    9:53 AM 01/19/2023    8:07 AM  Depression screen PHQ 2/9  Decreased Interest 0 0 0 0 0  Down, Depressed, Hopeless 0 0 0 0 0  PHQ - 2 Score 0 0 0 0 0  Altered sleeping 0 0 0 0 0  Tired, decreased energy 0 0 0 0 0  Change in appetite 0 0  0 0 0  Feeling bad or failure about yourself  0 0 0 0 0  Trouble concentrating 0 0 0 0 0  Moving slowly or fidgety/restless 0 0 0 0 0  Suicidal thoughts 0 0 0 0 0  PHQ-9 Score 0 0 0 0 0  Difficult doing work/chores     Not difficult at all    phq 9 is negative   Fall Risk:    08/06/2023    2:02 PM 07/28/2023    8:49 AM 06/08/2023    8:00 AM 05/05/2023    9:53 AM 01/19/2023    8:06 AM  Fall Risk   Falls in the past year? 0 0 0 0 0  Number falls in past yr: 0  0  0  Injury with Fall? 0  0  0  Risk for fall due to : No Fall Risks No Fall Risks No Fall Risks No Fall Risks No Fall Risks  Follow up Falls prevention discussed Falls prevention discussed Falls prevention discussed Falls prevention discussed Falls prevention discussed;Education provided;Falls evaluation completed      Functional Status Survey: Is the patient deaf or have difficulty hearing?: No Does the patient have difficulty seeing, even when wearing glasses/contacts?: No Does the patient have difficulty concentrating, remembering, or making decisions?: No Does the patient have difficulty walking or climbing stairs?: No Does the patient have difficulty dressing or bathing?: No Does the patient have difficulty doing errands alone such as visiting a doctor's office or shopping?: No    Assessment & Plan  1.  Wheezing  - Brain natriuretic peptide; Future - CBC with Differential/Platelet; Future - DG Chest 2 View; Future - Comprehensive metabolic panel; Future - Novel Coronavirus, NAA ([facility])  She is going to [place] next week and is very worried, we will rule out CHF and pneumonia, may be COVID-19, it all tests normal we will send steroid taper for bronchitis   2. Orthopnea  - Brain natriuretic peptide; Future - CBC with Differential/Platelet; Future - DG Chest 2 View; Future - Comprehensive metabolic panel; Future - Novel Coronavirus, NAA ([facility])

## 2023-08-09 DIAGNOSIS — R062 Wheezing: Secondary | ICD-10-CM | POA: Diagnosis not present

## 2023-08-09 DIAGNOSIS — R0601 Orthopnea: Secondary | ICD-10-CM | POA: Diagnosis not present

## 2023-08-10 ENCOUNTER — Other Ambulatory Visit: Payer: Self-pay

## 2023-08-10 ENCOUNTER — Emergency Department
Admission: EM | Admit: 2023-08-10 | Discharge: 2023-08-10 | Disposition: A | Discharge status: Home / Self Care | Payer: Medicare HMO | Attending: Emergency Medicine | Admitting: Emergency Medicine

## 2023-08-10 ENCOUNTER — Ambulatory Visit: Payer: Self-pay

## 2023-08-10 DIAGNOSIS — N183 Chronic kidney disease, stage 3 unspecified: Secondary | ICD-10-CM | POA: Diagnosis not present

## 2023-08-10 DIAGNOSIS — I129 Hypertensive chronic kidney disease with stage 1 through stage 4 chronic kidney disease, or unspecified chronic kidney disease: Secondary | ICD-10-CM | POA: Insufficient documentation

## 2023-08-10 DIAGNOSIS — L03116 Cellulitis of left lower limb: Secondary | ICD-10-CM | POA: Diagnosis not present

## 2023-08-10 DIAGNOSIS — E1140 Type 2 diabetes mellitus with diabetic neuropathy, unspecified: Secondary | ICD-10-CM | POA: Diagnosis not present

## 2023-08-10 DIAGNOSIS — J449 Chronic obstructive pulmonary disease, unspecified: Secondary | ICD-10-CM | POA: Insufficient documentation

## 2023-08-10 DIAGNOSIS — E1122 Type 2 diabetes mellitus with diabetic chronic kidney disease: Secondary | ICD-10-CM | POA: Insufficient documentation

## 2023-08-10 MED ORDER — CEPHALEXIN 500 MG PO CAPS: 500.0000 mg | ORAL_CAPSULE | Freq: Four times a day (QID) | ORAL | 0 refills | Status: AC

## 2023-08-10 NOTE — ED Provider Notes (Signed)
[facility] Provider Note    Event Date/Time   First MD Initiated Contact with Patient 08/10/23 1530     (approximate)   History   Leg Injury   HPI  Brandy Johnston is a 62 y.o. female with a past medical history of type 2 diabetes, obesity, who presents today for evaluation of redness and swelling to her left lower shin.  Patient reports that she thinks that she was bit by an insect in this area.  She also notes that she just darted prednisone for a COPD exacerbation and she is wondering if this is contributing as well.  She denies fevers or chills.  She reports that she is still able to ambulate.  Patient Active Problem List   Diagnosis Date Noted   Chronic left shoulder pain 11/09/2022   Hyperlipidemia 01/14/2021   Incomplete tear of left rotator cuff 07/03/2020   Angioedema due to angiotensin converting enzyme inhibitor (ACE-I) 01/29/2020   Chronic bilateral back pain 07/26/2017   Coronary artery calcification 05/17/2017   Heart palpitations 05/15/2017   History of acute gastritis    Leukocytosis 09/30/2016   Atherosclerosis of abdominal aorta (HCC) 08/18/2016   Type 2 diabetes mellitus with stage 3 chronic kidney disease, without long-term current use of insulin (HCC) 12/16/2015   Diabetic neuropathy associated with type 2 diabetes mellitus (HCC) 09/18/2015   Insomnia 09/18/2015   Benign essential HTN 06/18/2015   Chronic kidney disease (CKD), stage III (moderate) (HCC) 06/18/2015   Diabetes mellitus with renal manifestation (HCC) 06/18/2015   Obesity (BMI 30.0-34.9) 06/18/2015   Degenerative arthritis of hip 06/18/2015   Sickle cell trait (HCC) 06/18/2015   Dyslipidemia 05/27/2010   Gastro-esophageal reflux disease without esophagitis 05/25/2008          Physical Exam   Triage Vital Signs: ED Triage Vitals [08/10/23 1423]  Encounter Vitals Group     BP (!) 151/75     Systolic BP Percentile      Diastolic BP Percentile      Pulse Rate  63     Resp 18     Temp 97.9 F (36.6 C)     Temp Source Oral     SpO2 97 %     Weight 162 lb (73.5 kg)     Height 5\' 4"  (1.626 m)     Head Circumference      Peak Flow      Pain Score 0     Pain Loc      Pain Education      Exclude from Growth Chart     Most recent vital signs: Vitals:   08/10/23 1423  BP: (!) 151/75  Pulse: 63  Resp: 18  Temp: 97.9 F (36.6 C)  SpO2: 97%    Physical Exam Vitals and nursing note reviewed.  Constitutional:      General: Awake and alert. No acute distress.    Appearance: Normal appearance. The patient is normal weight.  HENT:     Head: Normocephalic and atraumatic.     Mouth: Mucous membranes are moist.  Eyes:     General: PERRL. Normal EOMs        Right eye: No discharge.        Left eye: No discharge.     Conjunctiva/sclera: Conjunctivae normal.  Cardiovascular:     Rate and Rhythm: Normal rate and regular rhythm.     Pulses: Normal pulses.  Pulmonary:     Effort: Pulmonary effort is normal.  No respiratory distress.     Breath sounds: Normal breath sounds.  Abdominal:     Abdomen is soft. There is no abdominal tenderness. No rebound or guarding. No distention. Musculoskeletal:        General: No swelling. Normal range of motion.     Cervical back: Normal range of motion and neck supple.  Skin:    General: Skin is warm and dry.     Capillary Refill: Capillary refill takes less than 2 seconds.     Findings: Left anterior shin with 2 x 2 cm circular area of erythema that is minimally tender to palpation.  Very minimally raised.  Not nodular.  No focal area of fluctuance.  No drainage.  There is a small open area near the inferior portion of the wound, no drainage. Neurological:     Mental Status: The patient is awake and alert.      ED Results / Procedures / Treatments   Labs (all labs ordered are listed, but only abnormal results are displayed) Labs Reviewed - No data to  display   EKG     RADIOLOGY     PROCEDURES:  Critical Care performed:   Procedures   MEDICATIONS ORDERED IN ED: Medications - No data to display   IMPRESSION / MDM / ASSESSMENT AND PLAN / ED COURSE  I reviewed the triage vital signs and the nursing notes.   Differential diagnosis includes, but is not limited to, erythema nodosum, cellulitis, insect bite.  I reviewed the patient's chart.  Patient called her primary care provider because she had just started taking prednisone and has developed a knot and wants to know if this is a reaction to the medication.  Patient is awake and alert, hemodynamically stable and afebrile. Physical exam is consistent with possible developing cellulitis versus erythema nodosum. No crepitus or bullae or hemodynamic instability or pain out of proportion to indicate deep space infection. No fluctuance to suggest cystic lesion such as abscess. No fevers or constitutional symptoms to suggest systemic infection.  Physical exam does not show any evidence of joint involvement. No lymphangitis. Patient will initiate oral antibiotics and follow-up closely as an outpatient. Return if worsening or developing new symptoms. Discussed care plan, return precautions, and advised close outpatient follow-up. Patient agrees with plan of care.  Patient's presentation is most consistent with acute complicated illness / injury requiring diagnostic workup.   FINAL CLINICAL IMPRESSION(S) / ED DIAGNOSES   Final diagnoses:  Cellulitis of left lower extremity     Rx / DC Orders   ED Discharge Orders          Ordered    cephALEXin (KEFLEX) 500 MG capsule  4 times daily        08/10/23 1537             Note:  This document was prepared using Dragon voice recognition software and may include unintentional dictation errors.   Keturah Shavers 08/10/23 1557    Jene Every, MD 08/14/23 [phone]

## 2023-08-10 NOTE — Telephone Encounter (Signed)
Message from Seama F sent at 08/10/2023  1:11 PM EDT  Summary: Possible medication reaction   Pt is calling in because she has just begun taking Prednisone and says she developed a knot under he knee that is red and wasn't there before. Pt thinks this is a reaction to the medication and wants to know is that a common symptom because it didn't happen until after she took the meds.         Chief Complaint: knot on leg midway down between knee and ankle Symptoms: swelling, knot is painful and hot to touch, pain is constant, redness  Pertinent Negatives: Patient denies SOB, chest pain Disposition: [x] ED /[] Urgent Care (no appt availability in office) / [] Appointment(In office/virtual)/ []  [facility]/ [] Home Care/ [] Refused Recommended Disposition /[] [facility] [facility]/ []  Follow-up with PCP Additional Notes: pt thought was either DVT or side effect from Prednisone   Reason for Disposition  [1] Thigh or calf pain AND [2] only 1 side AND [3] present > 1 hour  Answer Assessment - Initial Assessment Questions 2. LOCATION: "What part of the leg is swollen?"  "Are both legs swollen or just one leg?"     Lower leg one leg  3. SEVERITY: "How bad is the swelling?" (e.g., localized; mild, moderate, severe)   - Localized: Small area of swelling localized to one leg.   - MILD pedal edema: Swelling limited to foot and ankle, pitting edema < 1/4 inch (6 mm) deep, rest and elevation eliminate most or all swelling.   - MODERATE edema: Swelling of lower leg to knee, pitting edema > 1/4 inch (6 mm) deep, rest and elevation only partially reduce swelling.   - SEVERE edema: Swelling extends above knee, facial or hand swelling present.      Localized to lower leg area 4. REDNESS: "Does the swelling look red or infected?"   red 5. PAIN: "Is the swelling painful to touch?" If Yes, ask: "How painful is it?"   (Scale 1-10; mild, moderate or severe)     Moderate to severe 6. FEVER: "Do you have a  fever?" If Yes, ask: "What is it, how was it measured, and when did it start?"      no 7. CAUSE: "What do you think is causing the leg swelling?"     Blood clot though also side effect form Prednisone   10. OTHER SYMPTOMS: "Do you have any other symptoms?" (e.g., chest pain, difficulty breathing)       Area is size of tangerine and leg is shiny from swelling  Answer Assessment - Initial Assessment Questions 1. APPEARANCE of SWELLING: "What does it look like?"     Swelling  2. SIZE: "How large is the swelling?" (e.g., inches, cm; or compare to size of pinhead, tip of pen, eraser, coin, pea, grape, ping pong ball)      Small tangerine  size 3. LOCATION: "Where is the swelling located?"     Front of leg between nee and ankle   5. COLOR: "What color is it?" "Is there more than one color?"     Could not tell 6. PAIN: "Is there any pain?" If Yes, ask: "How bad is the pain?" (e.g., scale 1-10; or mild, moderate, severe)     - NONE (0): no pain   - MILD (1-3): doesn't interfere with normal activities    - MODERATE (4-7): interferes with normal activities or awakens from sleep    - SEVERE (8-10): excruciating pain, unable to  do any normal activities     Mod to sever and constant  8. CAUSE: "What do you think caused the swelling?" Pt worried blood clot 9 OTHER SYMPTOMS: "Do you have any other symptoms?" (e.g., fever)     Warm to touch and is more swollen than other leg  Protocols used: Skin Lump or Localized Swelling-A-AH, Leg Swelling and Edema-A-AH

## 2023-08-10 NOTE — ED Triage Notes (Signed)
PT sts that she has redness and swelling to the left lower shin. Pt sts that she was prescribed a steroid but nothing is working.

## 2023-08-10 NOTE — Discharge Instructions (Signed)
Please take the antibiotic as prescribed.  Please follow-up with your outpatient provider for recheck.  Please return for any new, worsening, or change in symptoms or other concerns.  It was a pleasure caring for you today. Have a wonderful trip to [place]!

## 2023-08-17 ENCOUNTER — Telehealth: Payer: Self-pay | Admitting: *Deleted

## 2023-08-17 NOTE — Transitions of Care (Post Inpatient/ED Visit) (Signed)
   08/17/2023  Name: SHASMEEN LOEFFEL MRN: [id number] DOB: Jun 30, 1961  Today's TOC FU Call Status: Today's TOC FU Call Status:: Unsuccessful Call (2nd Attempt) Unsuccessful Call (2nd Attempt) Date: 08/17/23  Attempted to reach the patient regarding the most recent Inpatient/ED visit.  Follow Up Plan: Additional outreach attempts will be made to reach the patient to complete the Transitions of Care (Post Inpatient/ED visit) call.   Gean Maidens BSN RN [facility] [phone]

## 2023-08-17 NOTE — Transitions of Care (Post Inpatient/ED Visit) (Signed)
   08/17/2023  Name: Brandy Johnston MRN: [id number] DOB: 08-21-1961  Today's TOC FU Call Status: Today's TOC FU Call Status:: Unsuccessful Call (1st Attempt) Unsuccessful Call (1st Attempt) Date: 08/17/23  Attempted to reach the patient regarding the most recent Inpatient/ED visit.  Follow Up Plan: Additional outreach attempts will be made to reach the patient to complete the Transitions of Care (Post Inpatient/ED visit) call.   Gean Maidens BSN RN [facility] [phone]

## 2023-08-19 ENCOUNTER — Telehealth: Payer: Self-pay | Admitting: *Deleted

## 2023-08-19 NOTE — Transitions of Care (Post Inpatient/ED Visit) (Signed)
   08/19/2023  Name: Brandy Johnston MRN: [id number] DOB: [birth year]  Today's TOC FU Call Status: Today's TOC FU Call Status:: Unsuccessful Call (3rd Attempt) Unsuccessful Call (3rd Attempt) Date: 08/19/23  Attempted to reach the patient regarding the most recent Inpatient/ED visit.  Follow Up Plan: Additional outreach attempts will be made to reach the patient to complete the Transitions of Care (Post Inpatient/ED visit) call.   Gean Maidens BSN RN [facility] [phone]

## 2023-08-23 ENCOUNTER — Other Ambulatory Visit: Payer: Self-pay | Admitting: Family Medicine

## 2023-08-23 DIAGNOSIS — G8929 Other chronic pain: Secondary | ICD-10-CM

## 2023-08-26 ENCOUNTER — Other Ambulatory Visit: Payer: Self-pay | Admitting: Family Medicine

## 2023-08-26 ENCOUNTER — Telehealth: Payer: Self-pay | Admitting: Family Medicine

## 2023-08-26 DIAGNOSIS — E1142 Type 2 diabetes mellitus with diabetic polyneuropathy: Secondary | ICD-10-CM

## 2023-08-26 NOTE — Telephone Encounter (Signed)
Prior auth started

## 2023-08-26 NOTE — Telephone Encounter (Signed)
Pt called in states needs PA for Ozempic.

## 2023-08-26 NOTE — Telephone Encounter (Signed)
Pt called in states gabapetin is too expensive at 200.00, insurance isnt covering and se nees this before she leaves on the cruise Monday

## 2023-08-27 NOTE — Telephone Encounter (Signed)
Pt is calling to ask is there another medication that can be sent in for her? Pt reports that she is going on a cruise on Monday.
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
## 2023-09-08 ENCOUNTER — Other Ambulatory Visit: Payer: Self-pay

## 2023-09-08 ENCOUNTER — Telehealth: Payer: Self-pay | Admitting: Family Medicine

## 2023-09-08 ENCOUNTER — Encounter: Payer: Self-pay | Admitting: Family Medicine

## 2023-09-08 ENCOUNTER — Ambulatory Visit (INDEPENDENT_AMBULATORY_CARE_PROVIDER_SITE_OTHER): Payer: Medicare HMO | Admitting: Family Medicine

## 2023-09-08 VITALS — BP 124/70 | HR 73 | Resp 16 | Ht 64.0 in | Wt 169.0 lb

## 2023-09-08 DIAGNOSIS — G43009 Migraine without aura, not intractable, without status migrainosus: Secondary | ICD-10-CM

## 2023-09-08 DIAGNOSIS — E1122 Type 2 diabetes mellitus with diabetic chronic kidney disease: Secondary | ICD-10-CM | POA: Diagnosis not present

## 2023-09-08 DIAGNOSIS — J45909 Unspecified asthma, uncomplicated: Secondary | ICD-10-CM

## 2023-09-08 DIAGNOSIS — Z7984 Long term (current) use of oral hypoglycemic drugs: Secondary | ICD-10-CM | POA: Diagnosis not present

## 2023-09-08 DIAGNOSIS — D573 Sickle-cell trait: Secondary | ICD-10-CM | POA: Diagnosis not present

## 2023-09-08 DIAGNOSIS — N1831 Chronic kidney disease, stage 3a: Secondary | ICD-10-CM | POA: Diagnosis not present

## 2023-09-08 DIAGNOSIS — I10 Essential (primary) hypertension: Secondary | ICD-10-CM | POA: Diagnosis not present

## 2023-09-08 DIAGNOSIS — I700 Atherosclerosis of aorta: Secondary | ICD-10-CM | POA: Diagnosis not present

## 2023-09-08 DIAGNOSIS — R062 Wheezing: Secondary | ICD-10-CM

## 2023-09-08 DIAGNOSIS — K219 Gastro-esophageal reflux disease without esophagitis: Secondary | ICD-10-CM | POA: Diagnosis not present

## 2023-09-08 DIAGNOSIS — E1142 Type 2 diabetes mellitus with diabetic polyneuropathy: Secondary | ICD-10-CM

## 2023-09-08 LAB — POCT GLYCOSYLATED HEMOGLOBIN (HGB A1C): Hemoglobin A1C: 6.5 % — AB (ref 4.0–5.6)

## 2023-09-08 MED ORDER — FLUTICASONE-SALMETEROL 100-50 MCG/ACT IN AEPB: 1.0000 | INHALATION_SPRAY | Freq: Two times a day (BID) | RESPIRATORY_TRACT | 1 refills | Status: DC

## 2023-09-08 MED ORDER — OZEMPIC (0.25 OR 0.5 MG/DOSE) 2 MG/3ML ~~LOC~~ SOPN: 0.2500 mg | PEN_INJECTOR | SUBCUTANEOUS | 0 refills | Status: DC

## 2023-09-08 MED ORDER — FAMOTIDINE 20 MG PO TABS
20.0000 mg | ORAL_TABLET | Freq: Two times a day (BID) | ORAL | 1 refills | Status: DC
Start: 2023-09-08 — End: 2024-03-21

## 2023-09-08 MED ORDER — DILTIAZEM HCL ER COATED BEADS 180 MG PO CP24
180.0000 mg | ORAL_CAPSULE | Freq: Every day | ORAL | 1 refills | Status: DC
Start: 2023-09-08 — End: 2024-01-18

## 2023-09-08 NOTE — Telephone Encounter (Signed)
Pt called reporting that her pharmacy told her that they do not have her order for Ozempic even though we have receipt confirming from 08/27/2023. She is requesting for someone to resolve this from the clinic. Please advise

## 2023-09-08 NOTE — Telephone Encounter (Signed)
PA attempted and per CoverMyMeds: "Information regarding your request. Available without authorization." Called and notified patient. Advised to contact pharmacy or insurance on how to get medication filled. Patient verbalized understanding.

## 2023-09-09 ENCOUNTER — Other Ambulatory Visit: Payer: Self-pay | Admitting: Family Medicine

## 2023-09-09 ENCOUNTER — Other Ambulatory Visit
Admission: RE | Admit: 2023-09-09 | Discharge: 2023-09-09 | Disposition: A | Discharge status: Home / Self Care | Payer: Medicare HMO | Attending: Family Medicine | Admitting: Family Medicine

## 2023-09-09 ENCOUNTER — Encounter: Payer: Self-pay | Admitting: Family Medicine

## 2023-09-09 ENCOUNTER — Ambulatory Visit: Payer: Self-pay

## 2023-09-09 ENCOUNTER — Ambulatory Visit (INDEPENDENT_AMBULATORY_CARE_PROVIDER_SITE_OTHER): Payer: Medicare HMO | Admitting: Family Medicine

## 2023-09-09 VITALS — BP 118/74 | HR 77 | Temp 97.5°F | Resp 16 | Ht 64.0 in | Wt 169.0 lb

## 2023-09-09 DIAGNOSIS — E876 Hypokalemia: Secondary | ICD-10-CM | POA: Diagnosis not present

## 2023-09-09 DIAGNOSIS — R252 Cramp and spasm: Secondary | ICD-10-CM | POA: Diagnosis not present

## 2023-09-09 DIAGNOSIS — M62838 Other muscle spasm: Secondary | ICD-10-CM

## 2023-09-09 DIAGNOSIS — E1122 Type 2 diabetes mellitus with diabetic chronic kidney disease: Secondary | ICD-10-CM | POA: Diagnosis not present

## 2023-09-09 DIAGNOSIS — I10 Essential (primary) hypertension: Secondary | ICD-10-CM

## 2023-09-09 DIAGNOSIS — N1831 Chronic kidney disease, stage 3a: Secondary | ICD-10-CM | POA: Diagnosis not present

## 2023-09-09 DIAGNOSIS — M791 Myalgia, unspecified site: Secondary | ICD-10-CM | POA: Diagnosis not present

## 2023-09-09 DIAGNOSIS — Z7985 Long-term (current) use of injectable non-insulin antidiabetic drugs: Secondary | ICD-10-CM

## 2023-09-09 LAB — MAGNESIUM: Magnesium: 2.7 mg/dL — ABNORMAL HIGH (ref 1.7–2.4)

## 2023-09-09 LAB — COMPREHENSIVE METABOLIC PANEL
ALT: 22 U/L (ref 0–44)
AST: 30 U/L (ref 15–41)
Albumin: 3.8 g/dL (ref 3.5–5.0)
Alkaline Phosphatase: 104 U/L (ref 38–126)
Anion gap: 12 (ref 5–15)
BUN: 14 mg/dL (ref 8–23)
CO2: 27 mmol/L (ref 22–32)
Calcium: 9.1 mg/dL (ref 8.9–10.3)
Chloride: 102 mmol/L (ref 98–111)
Creatinine, Ser: 1.4 mg/dL — ABNORMAL HIGH (ref 0.44–1.00)
GFR, Estimated: 42 mL/min — ABNORMAL LOW (ref >60)
Glucose, Bld: 123 mg/dL — ABNORMAL HIGH (ref 70–99)
Potassium: 4.1 mmol/L (ref 3.5–5.1)
Sodium: 141 mmol/L (ref 135–145)
Total Bilirubin: 0.5 mg/dL (ref 0.3–1.2)
Total Protein: 7.4 g/dL (ref 6.5–8.1)

## 2023-09-09 LAB — CBC WITH DIFFERENTIAL/PLATELET
Abs Immature Granulocytes: 0.16 10*3/uL — ABNORMAL HIGH (ref 0.00–0.07)
Basophils Absolute: 0.1 10*3/uL (ref 0.0–0.1)
Basophils Relative: 1 %
Eosinophils Absolute: 0.5 10*3/uL (ref 0.0–0.5)
Eosinophils Relative: 4 %
HCT: 39.1 % (ref 36.0–46.0)
Hemoglobin: 12.6 g/dL (ref 12.0–15.0)
Immature Granulocytes: 1 %
Lymphocytes Relative: 27 %
Lymphs Abs: 3.4 10*3/uL (ref 0.7–4.0)
MCH: 26.6 pg (ref 26.0–34.0)
MCHC: 32.2 g/dL (ref 30.0–36.0)
MCV: 82.5 fL (ref 80.0–100.0)
Monocytes Absolute: 0.8 10*3/uL (ref 0.1–1.0)
Monocytes Relative: 6 %
Neutro Abs: 7.8 10*3/uL — ABNORMAL HIGH (ref 1.7–7.7)
Neutrophils Relative %: 61 %
Platelets: 185 10*3/uL (ref 150–400)
RBC: 4.74 MIL/uL (ref 3.87–5.11)
RDW: 16 % — ABNORMAL HIGH (ref 11.5–15.5)
WBC: 12.8 10*3/uL — ABNORMAL HIGH (ref 4.0–10.5)
nRBC: 0 % (ref 0.0–0.2)

## 2023-09-09 LAB — VITAMIN B12: Vitamin B-12: 873 pg/mL (ref 180–914)

## 2023-09-09 LAB — IRON AND TIBC
Iron: 63 ug/dL (ref 28–170)
Saturation Ratios: 21 % (ref 10.4–31.8)
TIBC: 308 ug/dL (ref 250–450)
UIBC: 245 ug/dL

## 2023-09-09 LAB — TSH: TSH: 1.231 u[IU]/mL (ref 0.350–4.500)

## 2023-09-09 LAB — CK: Total CK: 269 U/L — ABNORMAL HIGH (ref 38–234)

## 2023-09-09 LAB — FERRITIN: Ferritin: 42 ng/mL (ref 11–307)

## 2023-09-09 MED ORDER — BACLOFEN 5 MG PO TABS
5.0000 mg | ORAL_TABLET | Freq: Three times a day (TID) | ORAL | 0 refills | Status: DC | PRN
Start: 2023-09-09 — End: 2023-10-06

## 2023-09-09 NOTE — Telephone Encounter (Signed)
  Chief Complaint: Muscle pain both legs and left arm Symptoms: above Frequency: 2:30 this morning Pertinent Negatives: Patient denies chest pain Disposition: [] ED /[] Urgent Care (no appt availability in office) / [x] Appointment(In office/virtual)/ []  [facility]/ [] Home Care/ [] Refused Recommended Disposition /[] [facility]/ []  Follow-up with PCP Additional Notes: Pt states that at 2:30 this morning she developed muscle pain in both legs and her left arm. Pt has had something similar in the past and it was d/t low electrolytes. Appt this morning in office.    Reason for Disposition  [1] SEVERE pain (e.g., excruciating, unable to do any normal activities) AND [2] not improved after 2 hours of pain medicine  Answer Assessment - Initial Assessment Questions 1. ONSET: "When did the pain start?"      2:30 am 2. LOCATION: "Where is the pain located?"      Back of both legs and left arm 3. PAIN: "How bad is the pain?"    (Scale 1-10; or mild, moderate, severe)   -  MILD (1-3): doesn't interfere with normal activities    -  MODERATE (4-7): interferes with normal activities (e.g., work or school) or awakens from sleep, limping    -  SEVERE (8-10): excruciating pain, unable to do any normal activities, unable to walk     severe 5. CAUSE: "What do you think is causing the leg pain?"     Low electrolytes? 6. OTHER SYMPTOMS: "Do you have any other symptoms?" (e.g., chest pain, back pain, breathing difficulty, swelling, rash, fever, numbness, weakness)     no  Protocols used: Leg Pain-A-AH

## 2023-09-09 NOTE — Patient Instructions (Signed)
Head over to the medical mall right now to get stat labs done. Please stay near your phone and available for me to call you today once I start getting results Push a lot of clear fluids with minerals electrolytes and small amount of sugar to help hydrate you.  If you have any severe pain, inability to walk, spasms that do not stop with moving and walking - it still may be appropriate to check into the ER for evaluation.

## 2023-09-09 NOTE — Progress Notes (Signed)
Patient ID: [name], female    DOB: 11-01-1961, 62 y.o.   MRN: [id number]  PCP: Alba Cory, MD  Chief Complaint  Patient presents with   Spasms    Bilateral legs started this morning around 2am, stiff/cramp    Subjective:   LYDIANN HAILS is a 62 y.o. female, presents to clinic with CC of the following:  HPI  Pt presents for severe muscle spasms and cramps in all extremities (worse in legs) that woke her from sleep last night at 2am, now muscles sore, esp legs, she is still having cramps and spasms/charlie horses She reports hx of the same that was severe associated with low potassium and she needed potassium replaced in ED setting She denies any palpitations, chest pain, neuropathy Muscles are sore that had strong contractions, she denies any persistently tense muscles She denies any recent nausea vomiting diarrhea or change in medications or foods. She is on magnesium supplements prescription from her doctor twice a day and she has been compliant with this Only recent med changes were inhalers which she got from her PCP about a month ago one of the newer ones is a maintenance Advair inhaler She denies any recent prolonged heat exposure sweating or exercising no urinary changes   Patient Active Problem List   Diagnosis Date Noted   Chronic left shoulder pain 11/09/2022   Hyperlipidemia 01/14/2021   Incomplete tear of left rotator cuff 07/03/2020   Angioedema due to angiotensin converting enzyme inhibitor (ACE-I) 01/29/2020   Chronic bilateral back pain 07/26/2017   Coronary artery calcification 05/17/2017   Heart palpitations 05/15/2017   History of acute gastritis    Leukocytosis 09/30/2016   Atherosclerosis of abdominal aorta (HCC) 08/18/2016   Type 2 diabetes mellitus with stage 3 chronic kidney disease, without long-term current use of insulin (HCC) 12/16/2015   Diabetic neuropathy associated with type 2 diabetes mellitus (HCC) 09/18/2015   Insomnia  09/18/2015   Benign essential HTN 06/18/2015   Chronic kidney disease (CKD), stage III (moderate) (HCC) 06/18/2015   Diabetes mellitus with renal manifestation (HCC) 06/18/2015   Obesity (BMI 30.0-34.9) 06/18/2015   Degenerative arthritis of hip 06/18/2015   Sickle cell trait (HCC) 06/18/2015   Dyslipidemia 05/27/2010   Gastro-esophageal reflux disease without esophagitis 05/25/2008      Current Outpatient Medications:    aspirin (ASPIRIN 81) 81 MG chewable tablet, Chew 1 tablet (81 mg total) by mouth daily., Disp: 30 tablet, Rfl: 0   Blood Glucose Monitoring Suppl (CONTOUR NEXT ONE) KIT, USE TO TEST BLOOD SUGAR ONCE D, Disp: , Rfl:    Butalbital-APAP-Caffeine 50-300-40 MG CAPS, Take 1 capsule by mouth every 4 (four) hours as needed., Disp: 40 capsule, Rfl: 0   Cholecalciferol (VITAMIN D) 2000 units CAPS, Take 1 capsule (2,000 Units total) by mouth daily., Disp: 30 capsule, Rfl: 0   cyclobenzaprine (FLEXERIL) 5 MG tablet, TAKE 1 TABLET(5 MG) BY MOUTH TWICE DAILY, Disp: 180 tablet, Rfl: 0   diclofenac Sodium (VOLTAREN) 1 % GEL, Apply 2 g topically 4 (four) times daily., Disp: 100 g, Rfl: 2   diltiazem (CARDIZEM CD) 180 MG 24 hr capsule, Take 1 capsule (180 mg total) by mouth daily., Disp: 90 capsule, Rfl: 1   ezetimibe (ZETIA) 10 MG tablet, Take 1 tablet (10 mg total) by mouth daily., Disp: 90 tablet, Rfl: 3   famotidine (PEPCID) 20 MG tablet, Take 1 tablet (20 mg total) by mouth 2 (two) times daily., Disp: 180 tablet, Rfl: 1  fluticasone-salmeterol (ADVAIR) 100-50 MCG/ACT AEPB, Inhale 1 puff into the lungs 2 (two) times daily., Disp: 1 each, Rfl: 1   gabapentin (NEURONTIN) 300 MG capsule, Take 1 capsule (300 mg total) by mouth 3 (three) times daily., Disp: 270 capsule, Rfl: 1   glucose blood test strip, Use as instructed, Disp: 100 each, Rfl: 12   loratadine (CLARITIN) 10 MG tablet, Take by mouth., Disp: , Rfl:    magnesium oxide (MAG-OX) 400 MG tablet, Take 1 tablet (400 mg total) by  mouth 2 (two) times daily., Disp: 180 tablet, Rfl: 1   Microlet Lancets MISC, USE TO CHECK BLOOD SUGAR ONCE D, Disp: , Rfl:    rosuvastatin (CRESTOR) 40 MG tablet, Take 1 tablet (40 mg total) by mouth daily., Disp: 90 tablet, Rfl: 3   Semaglutide,0.25 or 0.5MG /DOS, (OZEMPIC, 0.25 OR 0.5 MG/DOSE,) 2 MG/3ML SOPN, Inject 0.25 mg into the muscle once a week., Disp: 6 mL, Rfl: 0   traZODone (DESYREL) 50 MG tablet, TAKE 1 TABLET(50 MG) BY MOUTH AT BEDTIME, Disp: 90 tablet, Rfl: 0   triamcinolone cream (KENALOG) 0.1 %, Apply 1 Application topically 2 (two) times daily., Disp: 453.6 g, Rfl: 0   Allergies  Allergen Reactions   Ace Inhibitors Swelling    Angioedema    Lisinopril Swelling    Face and neck swelling     Social History   Tobacco Use   Smoking status: Never   Smokeless tobacco: Never  Vaping Use   Vaping status: Never Used  Substance Use Topics   Alcohol use: Yes    Alcohol/week: 0.0 standard drinks of alcohol    Comment: rare   Drug use: Yes    Types: Marijuana    Comment: smokes marijuana occasionally      Chart Review Today: I personally reviewed active problem list, medication list, allergies, family history, social history, health maintenance, notes from last encounter, lab results, imaging with the patient/caregiver today.   Review of Systems  Constitutional: Negative.   HENT: Negative.    Eyes: Negative.   Respiratory: Negative.    Cardiovascular: Negative.   Gastrointestinal: Negative.   Endocrine: Negative.   Genitourinary: Negative.   Musculoskeletal: Negative.   Skin: Negative.   Allergic/Immunologic: Negative.   Neurological: Negative.   Hematological: Negative.   Psychiatric/Behavioral: Negative.    All other systems reviewed and are negative.      Objective:   Vitals:   09/09/23 0937  BP: 118/74  Pulse: 77  Resp: 16  Temp: (!) 97.5 F (36.4 C)  TempSrc: Temporal  SpO2: 98%  Weight: 169 lb (76.7 kg)  Height: 5\' 4"  (1.626 m)    Body  mass index is 29.01 kg/m.  Physical Exam Vitals and nursing note reviewed.  Constitutional:      Appearance: She is well-developed.  HENT:     Head: Normocephalic and atraumatic.     Nose: Nose normal.  Eyes:     General:        Right eye: No discharge.        Left eye: No discharge.     Conjunctiva/sclera: Conjunctivae normal.  Neck:     Trachea: No tracheal deviation.  Cardiovascular:     Rate and Rhythm: Normal rate and regular rhythm.     Pulses: Normal pulses.     Heart sounds: Normal heart sounds.  Pulmonary:     Effort: Pulmonary effort is normal. No respiratory distress.     Breath sounds: No stridor. No wheezing, rhonchi or rales.  Musculoskeletal:        General: No swelling or deformity.     Right lower leg: No edema.     Left lower leg: No edema.     Comments: Generalized muscle tenderness to lower legs, muscle compartments to all extremities soft, no active/visible msk spasms/contractures to all extremities  Skin:    General: Skin is warm and dry.     Findings: No rash.  Neurological:     Mental Status: She is alert.     Motor: No abnormal muscle tone.     Coordination: Coordination normal.     Gait: Gait abnormal (mildly antalgic gait).  Psychiatric:        Behavior: Behavior normal.      Results for orders placed or performed in visit on 09/08/23  POCT HgB A1C  Result Value Ref Range   Hemoglobin A1C 6.5 (A) 4.0 - 5.6 %   HbA1c POC (<> result, manual entry)     HbA1c, POC (prediabetic range)     HbA1c, POC (controlled diabetic range)         Assessment & Plan:    Patient is a 62 year old female presents with severe muscle spasms cramps and muscle pain onset last night and continued today.  Her PCP is Dr. Carlynn Purl.  New to me.  She reports very similar episode of severe symptoms occurred about a year ago where she was found to have very low potassium. Patient's lab work checking for deficiencies and electrolyte abnormalities have been ordered to be  performed at [facility] lab stat. All muscle compartments were soft, she had not had any cardiac symptoms, vital signs are stable and she was appearing uncomfortable but stable for outpatient workup with expedited labs.  She was encouraged to get labs done right now and stay available for results, also to continue to try her muscle relaxers her magnesium supplements and push fluids with electrolytes minerals etc.    ICD-10-CM   1. Muscle spasms of both lower extremities  M62.838 Magnesium    CBC with Differential/Platelet    TSH    Comprehensive metabolic panel    Vitamin B12    Ferritin    Iron and TIBC    CK (Creatine Kinase)   upper and lower, severe last night, muscle relaxers, pushing electrolytes/using mustard, and taking her mag have not helped    2. Myalgia  M79.10 Magnesium    CBC with Differential/Platelet    TSH    Comprehensive metabolic panel    Vitamin B12    Ferritin    Iron and TIBC    CK (Creatine Kinase)    3. Muscle cramps  R25.2 Magnesium    Comprehensive metabolic panel    Ferritin    Iron and TIBC    CK (Creatine Kinase)    4. Stage 3a chronic kidney disease (HCC)  N18.31 Comprehensive metabolic panel    5. Type 2 diabetes mellitus with stage 3a chronic kidney disease, without long-term current use of insulin (HCC)  E11.22 CBC with Differential/Platelet   N18.31 Comprehensive metabolic panel   she reported sugars being a little bit high, but overall DM well controlled    6. Hypokalemia  E87.6 Magnesium    Comprehensive metabolic panel   hx of, most recent labs show normal potassium and pt is not on supplement, no meds or recent acute sx to cause loss - labs and replace if needed     Return for next week if needed for sx  or lab recheck .      Danelle Berry, PA-C 09/09/23 9:54 AM

## 2023-09-15 ENCOUNTER — Telehealth (INDEPENDENT_AMBULATORY_CARE_PROVIDER_SITE_OTHER): Payer: Medicare HMO | Admitting: Nurse Practitioner

## 2023-09-15 DIAGNOSIS — J069 Acute upper respiratory infection, unspecified: Secondary | ICD-10-CM | POA: Diagnosis not present

## 2023-09-15 DIAGNOSIS — J42 Unspecified chronic bronchitis: Secondary | ICD-10-CM

## 2023-09-15 MED ORDER — PROMETHAZINE-DM 6.25-15 MG/5ML PO SYRP
5.0000 mL | ORAL_SOLUTION | Freq: Four times a day (QID) | ORAL | 0 refills | Status: DC | PRN
Start: 2023-09-15 — End: 2023-11-18

## 2023-09-15 MED ORDER — BREZTRI AEROSPHERE 160-9-4.8 MCG/ACT IN AERO
2.0000 | INHALATION_SPRAY | Freq: Two times a day (BID) | RESPIRATORY_TRACT | 0 refills | Status: DC
Start: 2023-09-15 — End: 2023-12-09

## 2023-09-15 MED ORDER — PREDNISONE 10 MG (21) PO TBPK: ORAL_TABLET | ORAL | 0 refills | Status: DC

## 2023-09-15 MED ORDER — BENZONATATE 100 MG PO CAPS: 200.0000 mg | ORAL_CAPSULE | Freq: Two times a day (BID) | ORAL | 0 refills | Status: DC | PRN

## 2023-09-15 NOTE — Progress Notes (Signed)
Name: Brandy Johnston   MRN: [id number]    DOB: November 20, 1961   Date:09/15/2023       Progress Note  Subjective  Chief Complaint  Chief Complaint  Patient presents with   Cough    ADVAIR not helping pt, cough has got worst and coughing up green phlegm    I connected with  [name]  on 09/15/23 at 10:40 am by a video enabled telemedicine application and verified that I am speaking with the correct person using two identifiers.  I discussed the limitations of evaluation and management by telemedicine and the availability of in person appointments. The patient expressed understanding and agreed to proceed with a virtual visit  Staff also discussed with the patient that there may be a patient responsible charge related to this service. Patient Location: home Provider Location: [facility] Additional Individuals present: alone  HPI  URI Compliant/chronic bronchitis: symptoms started more than a week,however patient also reports she has continued to wheeze since her last illness.  She says she was treated for bronchitis a few months ago.  -Fever: no -Cough: yes -Shortness of breath: not too bad -Wheezing: yes -Chest congestion: yes -Nasal congestion: yes -Runny nose: yes -Post nasal drip: yes -Sore throat: no -Sinus pressure: yes -Headache: no -Face pain: no -Ear pain:  no -Ear pressure: no -Relief with OTC cold/cough medications: mucinex, no improvement - has used her advair, with no improvement -patient reports she was started on advair for bronchitis, but it has not really helped.  Will switch to breztri,  start tessalon perls, and phenergan dm for cough.  Steroid taper also sent in.  If no improvement will need to see patient in person.    Patient Active Problem List   Diagnosis Date Noted   Chronic left shoulder pain 11/09/2022   Hyperlipidemia 01/14/2021   Incomplete tear of left rotator cuff 07/03/2020   Angioedema due to angiotensin converting enzyme inhibitor (ACE-I)  01/29/2020   Chronic bilateral back pain 07/26/2017   Coronary artery calcification 05/17/2017   Heart palpitations 05/15/2017   History of acute gastritis    Leukocytosis 09/30/2016   Atherosclerosis of abdominal aorta (HCC) 08/18/2016   Type 2 diabetes mellitus with stage 3 chronic kidney disease, without long-term current use of insulin (HCC) 12/16/2015   Diabetic neuropathy associated with type 2 diabetes mellitus (HCC) 09/18/2015   Insomnia 09/18/2015   Benign essential HTN 06/18/2015   Chronic kidney disease (CKD), stage III (moderate) (HCC) 06/18/2015   Diabetes mellitus with renal manifestation (HCC) 06/18/2015   Obesity (BMI 30.0-34.9) 06/18/2015   Degenerative arthritis of hip 06/18/2015   Sickle cell trait (HCC) 06/18/2015   Dyslipidemia 05/27/2010   Gastro-esophageal reflux disease without esophagitis 05/25/2008    Social History   Tobacco Use   Smoking status: Never   Smokeless tobacco: Never  Substance Use Topics   Alcohol use: Yes    Alcohol/week: 0.0 standard drinks of alcohol    Comment: rare     Current Outpatient Medications:    aspirin (ASPIRIN 81) 81 MG chewable tablet, Chew 1 tablet (81 mg total) by mouth daily., Disp: 30 tablet, Rfl: 0   Baclofen 5 MG TABS, Take 1-2 tablets (5-10 mg total) by mouth 3 (three) times daily as needed (msk pain or spasms)., Disp: 30 tablet, Rfl: 0   Blood Glucose Monitoring Suppl (CONTOUR NEXT ONE) KIT, USE TO TEST BLOOD SUGAR ONCE D, Disp: , Rfl:    Butalbital-APAP-Caffeine 50-300-40 MG CAPS, Take 1 capsule by  mouth every 4 (four) hours as needed., Disp: 40 capsule, Rfl: 0   Cholecalciferol (VITAMIN D) 2000 units CAPS, Take 1 capsule (2,000 Units total) by mouth daily., Disp: 30 capsule, Rfl: 0   cyclobenzaprine (FLEXERIL) 5 MG tablet, TAKE 1 TABLET(5 MG) BY MOUTH TWICE DAILY, Disp: 180 tablet, Rfl: 0   diclofenac Sodium (VOLTAREN) 1 % GEL, Apply 2 g topically 4 (four) times daily., Disp: 100 g, Rfl: 2   diltiazem (CARDIZEM  CD) 180 MG 24 hr capsule, Take 1 capsule (180 mg total) by mouth daily., Disp: 90 capsule, Rfl: 1   ezetimibe (ZETIA) 10 MG tablet, Take 1 tablet (10 mg total) by mouth daily., Disp: 90 tablet, Rfl: 3   famotidine (PEPCID) 20 MG tablet, Take 1 tablet (20 mg total) by mouth 2 (two) times daily., Disp: 180 tablet, Rfl: 1   gabapentin (NEURONTIN) 300 MG capsule, Take 1 capsule (300 mg total) by mouth 3 (three) times daily., Disp: 270 capsule, Rfl: 1   glucose blood test strip, Use as instructed, Disp: 100 each, Rfl: 12   loratadine (CLARITIN) 10 MG tablet, Take by mouth., Disp: , Rfl:    magnesium oxide (MAG-OX) 400 MG tablet, Take 1 tablet (400 mg total) by mouth 2 (two) times daily., Disp: 180 tablet, Rfl: 1   Microlet Lancets MISC, USE TO CHECK BLOOD SUGAR ONCE D, Disp: , Rfl:    rosuvastatin (CRESTOR) 40 MG tablet, Take 1 tablet (40 mg total) by mouth daily., Disp: 90 tablet, Rfl: 3   Semaglutide,0.25 or 0.5MG /DOS, (OZEMPIC, 0.25 OR 0.5 MG/DOSE,) 2 MG/3ML SOPN, Inject 0.25 mg into the muscle once a week., Disp: 6 mL, Rfl: 0   traZODone (DESYREL) 50 MG tablet, TAKE 1 TABLET(50 MG) BY MOUTH AT BEDTIME, Disp: 90 tablet, Rfl: 0   triamcinolone cream (KENALOG) 0.1 %, Apply 1 Application topically 2 (two) times daily., Disp: 453.6 g, Rfl: 0   fluticasone-salmeterol (ADVAIR) 100-50 MCG/ACT AEPB, Inhale 1 puff into the lungs 2 (two) times daily. (Patient not taking: Reported on 09/15/2023), Disp: 1 each, Rfl: 1  Allergies  Allergen Reactions   Ace Inhibitors Swelling    Angioedema    Lisinopril Swelling    Face and neck swelling    I personally reviewed active problem list, medication list, allergies, notes from last encounter with the patient/caregiver today.  ROS  Ten systems reviewed and is negative except as mentioned in HPI    Objective  Virtual encounter, vitals not obtained.  There is no height or weight on file to calculate BMI.  Nursing Note and Vital Signs reviewed.  Physical  Exam  Awake, alert and oriented, speaking in complete sentences  No results found for this or any previous visit (from the past 72 hour(s)).  Assessment & Plan  Problem List Items Addressed This Visit   None Visit Diagnoses     Viral upper respiratory tract infection    -  Primary   Will switch to breztri,  start tessalon perls, and phenergan dm for cough.  Steroid taper also sent in.  If no improvement will need to see patient in person.   Relevant Medications   benzonatate (TESSALON) 100 MG capsule   Budeson-Glycopyrrol-Formoterol (BREZTRI AEROSPHERE) 160-9-4.8 MCG/ACT AERO   promethazine-dextromethorphan (PROMETHAZINE-DM) 6.25-15 MG/5ML syrup   predniSONE (STERAPRED UNI-PAK 21 TAB) 10 MG (21) TBPK tablet   Chronic bronchitis, unspecified chronic bronchitis type (HCC)       Will switch to breztri,  start tessalon perls, and phenergan dm for cough.  Steroid taper also sent in.  If no improvement will need to see patient in person.        -Red flags and when to present for emergency care or RTC including fever >101.59F, chest pain, shortness of breath, new/worsening/un-resolving symptoms,  reviewed with patient at time of visit. Follow up and care instructions discussed and provided in AVS. - I discussed the assessment and treatment plan with the patient. The patient was provided an opportunity to ask questions and all were answered. The patient agreed with the plan and demonstrated an understanding of the instructions.  I provided 15 minutes of non-face-to-face time during this encounter.  Berniece Salines, FNP

## 2023-09-22 DIAGNOSIS — M19012 Primary osteoarthritis, left shoulder: Secondary | ICD-10-CM | POA: Diagnosis not present

## 2023-10-04 ENCOUNTER — Ambulatory Visit: Payer: Self-pay

## 2023-10-04 NOTE — Telephone Encounter (Signed)
Patient called, left VM to return the call to the office to speak to the NT.    Summary: leg and feet cramps / rx req   The patient shares that they have began to experience cramps in their legs and feet  The patient first began to experience cramps last night 10/03/23  The patient would like to be prescribed something for their discomfort and cramping  Please contact further when possible

## 2023-10-04 NOTE — Telephone Encounter (Signed)
  Chief Complaint: Muscle cramps  Symptoms: Cramping both legs and feet. Occur daily, during day and night 3-4 times. Frequency: worsening x 2 days Pertinent Negatives: Patient denies  Disposition: [] ED /[] Urgent Care (no appt availability in office) / [] Appointment(In office/virtual)/ []  [place] [facility]/ [] Home Care/ [] Refused Recommended Disposition /[]  Mobile Bus/ [x]  Follow-up with PCP Additional Notes:  Pt seen 09/09/23, RX Baclofen. Had been taking 2 tabs twice daily, then 1 tab twice daily. Pt is now out, calling for refill. States cramping is "Sometimes really hard to work out. ALmost called 911,  so bad." Please advise does pt need appt or OK to refill med.? Pt's CB # [id number] Reason for Disposition  Leg pain or muscle cramp is a chronic symptom (recurrent or ongoing AND present > 4 weeks)  Answer Assessment - Initial Assessment Questions 1. ONSET: "When did the pain start?"      2 days ago 2. LOCATION: "Where is the pain located?"      Both legs and both feet 3. PAIN: "How bad is the pain?"    (Scale 1-10; or mild, moderate, severe)   -  MILD (1-3): doesn't interfere with normal activities    -  MODERATE (4-7): interferes with normal activities (e.g., work or school) or awakens from sleep, limping    -  SEVERE (8-10): excruciating pain, unable to do any normal activities, unable to walk     Cramps, "Get up and walk, works sometimes." 4. WORK OR EXERCISE: "Has there been any recent work or exercise that involved this part of the body?"      No 5. CAUSE: "What do you think is causing the leg pain?"     Cramps. 6. OTHER SYMPTOMS: "Do you have any other symptoms?" (e.g., chest pain, back pain, breathing difficulty, swelling, rash, fever, numbness, weakness)     Cramps in both legs and both feet  Protocols used: Leg Pain-A-AH

## 2023-10-05 ENCOUNTER — Other Ambulatory Visit: Payer: Self-pay | Admitting: Family Medicine

## 2023-10-05 DIAGNOSIS — R790 Abnormal level of blood mineral: Secondary | ICD-10-CM | POA: Diagnosis not present

## 2023-10-05 DIAGNOSIS — R748 Abnormal levels of other serum enzymes: Secondary | ICD-10-CM | POA: Diagnosis not present

## 2023-10-05 DIAGNOSIS — D72828 Other elevated white blood cell count: Secondary | ICD-10-CM | POA: Diagnosis not present

## 2023-10-06 ENCOUNTER — Other Ambulatory Visit: Payer: Self-pay | Admitting: Family Medicine

## 2023-10-06 ENCOUNTER — Other Ambulatory Visit: Payer: Self-pay

## 2023-10-06 ENCOUNTER — Ambulatory Visit: Payer: Self-pay

## 2023-10-06 ENCOUNTER — Encounter: Payer: Self-pay | Admitting: Family Medicine

## 2023-10-06 DIAGNOSIS — R252 Cramp and spasm: Secondary | ICD-10-CM

## 2023-10-06 DIAGNOSIS — M62838 Other muscle spasm: Secondary | ICD-10-CM

## 2023-10-06 LAB — CBC WITH DIFFERENTIAL/PLATELET
Absolute Monocytes: 976 {cells}/uL — ABNORMAL HIGH (ref 200–950)
Basophils Absolute: 85 {cells}/uL (ref 0–200)
Basophils Relative: 0.7 %
Eosinophils Absolute: 195 {cells}/uL (ref 15–500)
Eosinophils Relative: 1.6 %
HCT: 40.5 % (ref 35.0–45.0)
Hemoglobin: 12.8 g/dL (ref 11.7–15.5)
Lymphs Abs: 2928 {cells}/uL (ref 850–3900)
MCH: 26.6 pg — ABNORMAL LOW (ref 27.0–33.0)
MCHC: 31.6 g/dL — ABNORMAL LOW (ref 32.0–36.0)
MCV: 84.2 fL (ref 80.0–100.0)
MPV: 11.2 fL (ref 7.5–12.5)
Monocytes Relative: 8 %
Neutro Abs: 8015 {cells}/uL — ABNORMAL HIGH (ref 1500–7800)
Neutrophils Relative %: 65.7 %
Platelets: 220 10*3/uL (ref 140–400)
RBC: 4.81 10*6/uL (ref 3.80–5.10)
RDW: 15.5 % — ABNORMAL HIGH (ref 11.0–15.0)
Total Lymphocyte: 24 %
WBC: 12.2 10*3/uL — ABNORMAL HIGH (ref 3.8–10.8)

## 2023-10-06 LAB — MAGNESIUM: Magnesium: 2.3 mg/dL (ref 1.5–2.5)

## 2023-10-06 LAB — CK: Total CK: 201 U/L — ABNORMAL HIGH (ref 29–143)

## 2023-10-06 MED ORDER — BACLOFEN 10 MG PO TABS: 10.0000 mg | ORAL_TABLET | Freq: Three times a day (TID) | ORAL | 0 refills | Status: DC

## 2023-10-06 MED ORDER — BACLOFEN 5 MG PO TABS
5.0000 mg | ORAL_TABLET | Freq: Three times a day (TID) | ORAL | 0 refills | Status: DC | PRN
Start: 2023-10-06 — End: 2023-10-06

## 2023-10-06 NOTE — Telephone Encounter (Signed)
Summary: Cramps, had appt yesterday. Still symptomatic   Pt called and reported that she had cramping in her legs last night following her appt yesterday. Says this woke her up out of her sleep.         Chief Complaint: Continues to have muscle cramps to legs and feet. Had lab work done yesterday. Also continues to have cough with green mucus. Symptoms: Above Frequency: 2 weeks Pertinent Negatives: Patient denies  Disposition: [] ED /[] Urgent Care (no appt availability in office) / [] Appointment(In office/virtual)/ []  [facility]/ [] Home Care/ [x] Refused Recommended Disposition /[] [place] [facility]/ [x]  Follow-up with PCP Additional Notes: Please advise pt.  Reason for Disposition  [1] MODERATE pain (e.g., interferes with normal activities) AND [2] present > 3 days  Answer Assessment - Initial Assessment Questions 1. ONSET: "When did the muscle aches or body pains start?"      2 weeks ago 2. LOCATION: "What part of your body is hurting?" (e.g., entire body, arms, legs)      Legs and feet 3. SEVERITY: "How bad is the pain?" (Scale 1-10; or mild, moderate, severe)   - MILD (1-3): doesn't interfere with normal activities    - MODERATE (4-7): interferes with normal activities or awakens from sleep    - SEVERE (8-10):  excruciating pain, unable to do any normal activities      Severe 4. CAUSE: "What do you think is causing the pains?"     Unsure 5. FEVER: "Have you been having fever?"     No 6. OTHER SYMPTOMS: "Do you have any other symptoms?" (e.g., chest pain, weakness, rash, cold or flu symptoms, weight loss)     No 7. PREGNANCY: "Is there any chance you are pregnant?" "When was your last menstrual period?"     No 8. TRAVEL: "Have you traveled out of the country in the last month?" (e.g., travel history, exposures)     No  Protocols used: Muscle Aches and Body Pain-A-AH

## 2023-10-06 NOTE — Telephone Encounter (Signed)
Left voicemail for patient relaying information/advice from Dr. Carlynn Purl.

## 2023-10-10 DIAGNOSIS — G4762 Sleep related leg cramps: Secondary | ICD-10-CM | POA: Diagnosis not present

## 2023-10-10 DIAGNOSIS — J069 Acute upper respiratory infection, unspecified: Secondary | ICD-10-CM | POA: Diagnosis not present

## 2023-10-13 ENCOUNTER — Ambulatory Visit: Payer: Self-pay | Admitting: *Deleted

## 2023-10-13 ENCOUNTER — Other Ambulatory Visit: Payer: Self-pay

## 2023-10-13 DIAGNOSIS — E1142 Type 2 diabetes mellitus with diabetic polyneuropathy: Secondary | ICD-10-CM

## 2023-10-13 MED ORDER — OZEMPIC (0.25 OR 0.5 MG/DOSE) 2 MG/3ML ~~LOC~~ SOPN: 0.5000 mg | PEN_INJECTOR | SUBCUTANEOUS | 0 refills | Status: DC

## 2023-10-13 NOTE — Telephone Encounter (Signed)
  Chief Complaint: Please confirm Ozempic dosing for patient  Disposition: [] ED /[] Urgent Care (no appt availability in office) / [] Appointment(In office/virtual)/ []  [facility]/ [] Home Care/ [] Refused Recommended Disposition /[] [place] [facility]/ [x]  Follow-up with PCP Additional Notes: Please confirm current dosing for Ozempic- dosing decreased and can not find notation regarding change in chart. Please review and contact patient.

## 2023-10-13 NOTE — Telephone Encounter (Signed)
Dose corrected and sent in. Patient made aware

## 2023-10-13 NOTE — Telephone Encounter (Signed)
Summary: rx concern   The patient has concerns related to their recently increasedSemaglutide,0.25 or 0.5MG /DOS, (OZEMPIC, 0.25 OR 0.5 MG/DOSE,) 2 MG/3ML SOPN [[id number]] prescription  The patient would ike to know the reason for the increase and have it confirmed by a member of clinical staff  Please contact when possible     Reason for Disposition  [1] Caller has URGENT medicine question about med that PCP or specialist prescribed AND [2] triager unable to answer question  Answer Assessment - Initial Assessment Questions 1. NAME of MEDICINE: "What medicine(s) are you calling about?"     Ozempic 2. QUESTION: "What is your question?" (e.g., double dose of medicine, side effect)     Please clarify for patient- her correct dosing 3. PRESCRIBER: "Who prescribed the medicine?" Reason: if prescribed by specialist, call should be referred to that group.     PCP Patient has different dosing in medication list- was using 0.5mg - present Rx is for 0.25mg - unable to find confirming notes. Sent for PCP review  Protocols used: Medication Question Call-A-AH

## 2023-10-15 ENCOUNTER — Emergency Department: Payer: Medicare HMO

## 2023-10-15 ENCOUNTER — Emergency Department
Admission: EM | Admit: 2023-10-15 | Discharge: 2023-10-16 | Disposition: A | Discharge status: Home / Self Care | Payer: Medicare HMO | Attending: Emergency Medicine | Admitting: Emergency Medicine

## 2023-10-15 ENCOUNTER — Other Ambulatory Visit: Payer: Self-pay

## 2023-10-15 DIAGNOSIS — R051 Acute cough: Secondary | ICD-10-CM | POA: Insufficient documentation

## 2023-10-15 DIAGNOSIS — R059 Cough, unspecified: Secondary | ICD-10-CM | POA: Diagnosis not present

## 2023-10-15 DIAGNOSIS — R110 Nausea: Secondary | ICD-10-CM | POA: Insufficient documentation

## 2023-10-15 DIAGNOSIS — Z1152 Encounter for screening for COVID-19: Secondary | ICD-10-CM | POA: Insufficient documentation

## 2023-10-15 DIAGNOSIS — R42 Dizziness and giddiness: Secondary | ICD-10-CM | POA: Diagnosis not present

## 2023-10-15 LAB — URINALYSIS, ROUTINE W REFLEX MICROSCOPIC
Bacteria, UA: NONE SEEN
Bilirubin Urine: NEGATIVE
Glucose, UA: NEGATIVE mg/dL
Hgb urine dipstick: NEGATIVE
Ketones, ur: NEGATIVE mg/dL
Nitrite: NEGATIVE
Protein, ur: 30 mg/dL — AB
Specific Gravity, Urine: 1.013 (ref 1.005–1.030)
Squamous Epithelial / HPF: >50 /[HPF] (ref 0–5)
pH: 7 (ref 5.0–8.0)

## 2023-10-15 LAB — BASIC METABOLIC PANEL
Anion gap: 8 (ref 5–15)
BUN: 13 mg/dL (ref 8–23)
CO2: 24 mmol/L (ref 22–32)
Calcium: 9 mg/dL (ref 8.9–10.3)
Chloride: 106 mmol/L (ref 98–111)
Creatinine, Ser: 1.24 mg/dL — ABNORMAL HIGH (ref 0.44–1.00)
GFR, Estimated: 49 mL/min — ABNORMAL LOW (ref >60)
Glucose, Bld: 101 mg/dL — ABNORMAL HIGH (ref 70–99)
Potassium: 4.3 mmol/L (ref 3.5–5.1)
Sodium: 138 mmol/L (ref 135–145)

## 2023-10-15 LAB — CBC
HCT: 41.5 % (ref 36.0–46.0)
Hemoglobin: 13 g/dL (ref 12.0–15.0)
MCH: 26.7 pg (ref 26.0–34.0)
MCHC: 31.3 g/dL (ref 30.0–36.0)
MCV: 85.2 fL (ref 80.0–100.0)
Platelets: 164 10*3/uL (ref 150–400)
RBC: 4.87 MIL/uL (ref 3.87–5.11)
RDW: 16.3 % — ABNORMAL HIGH (ref 11.5–15.5)
WBC: 11.2 10*3/uL — ABNORMAL HIGH (ref 4.0–10.5)
nRBC: 0 % (ref 0.0–0.2)

## 2023-10-15 LAB — MAGNESIUM: Magnesium: 2.3 mg/dL (ref 1.7–2.4)

## 2023-10-15 LAB — D-DIMER, QUANTITATIVE: D-Dimer, Quant: 0.46 ug{FEU}/mL (ref 0.00–0.50)

## 2023-10-15 LAB — SARS CORONAVIRUS 2 BY RT PCR: SARS Coronavirus 2 by RT PCR: NEGATIVE

## 2023-10-15 LAB — CK: Total CK: 231 U/L (ref 38–234)

## 2023-10-15 MED ORDER — DOXYCYCLINE MONOHYDRATE 100 MG PO TABS: 100.0000 mg | ORAL_TABLET | Freq: Two times a day (BID) | ORAL | 0 refills | Status: AC

## 2023-10-15 MED ORDER — MECLIZINE HCL 25 MG PO TABS: 25.0000 mg | ORAL_TABLET | Freq: Three times a day (TID) | ORAL | 0 refills | Status: DC | PRN

## 2023-10-15 MED ORDER — MECLIZINE HCL 25 MG PO TABS
25.0000 mg | ORAL_TABLET | Freq: Once | ORAL | Status: AC
  Administered 2023-10-15: 25 mg via ORAL
  Filled 2023-10-15: qty 1

## 2023-10-15 MED ORDER — ONDANSETRON 4 MG PO TBDP: 4.0000 mg | ORAL_TABLET | Freq: Three times a day (TID) | ORAL | 0 refills | Status: DC | PRN

## 2023-10-15 MED ORDER — PREDNISONE 10 MG PO TABS: 20.0000 mg | ORAL_TABLET | Freq: Every day | ORAL | 0 refills | Status: AC

## 2023-10-15 NOTE — Discharge Instructions (Addendum)
You are seen in the emergency department for cough, leg cramps, dizziness and feeling nauseous.  You had an MRI of your brain that did not show any signs of a stroke.  Your chest x-ray did not show any signs of pneumonia.  You had screening test to look for blood clots and that was negative.  Call your primary care physician in the morning to schedule close follow-up appointment.  You are given a prescription for meclizine which can help with dizziness and vertigo.  You are also given information of how to perform Epley maneuvers.  You are given a short course of steroids for possible vestibular neuritis.  Prednisone -you are given a prescription for a steroid.  It is important that you take this medication with food.  This medication can cause an upset stomach.  It also can increase your glucose if you have a history of diabetes, so it is important that you check your glucose frequently while you are on this medication.  Doxycycline - This medication can cause acid reflux.  It is important that you take it with food and drink plenty of water.  Do not lie down for 1 hour after taking this medication.  It also causes sun sensitivity so stay out of the sun or wear SPF while on this medication.  Thank you for choosing [place] for your health care, it was my pleasure to care for you today!  Corena Herter, MD

## 2023-10-15 NOTE — ED Triage Notes (Signed)
Pt comes with c/o dizziness and nausea that started this morning. Pt states she had been sick with cough for about a month. Pt states she felt off balance. Pt states neg home covid test. Pt states productive green sputum. Pt was having bad cramps in her legs and has been treated for  that.  Pt states little sob.

## 2023-10-15 NOTE — ED Provider Notes (Signed)
[facility] Provider Note    Event Date/Time   First MD Initiated Contact with Patient 10/15/23 1948     (approximate)   History   Nausea and Dizziness   HPI  Brandy Johnston is a 62 y.o. female presents to the emergency department with multiple complaints.  Patient states that she got back from a cruise in [place] 1 month ago.  Since that time complaining of pain to both of her legs with cramping sensation.  Over the past week states that she has been having an ongoing productive cough with green sputum production.  Denies any fever or chills.  Denies any chest pain.  Denies any significant shortness of breath.  Denies any tobacco use.  Denies any ringing in her ears or change in hearing.  Over the past day states that she has been feeling dizzy with some room spinning episodes.  Feeling nauseous and like her gait was off.  Denies history of DVT or PE.     Physical Exam   Triage Vital Signs: ED Triage Vitals  Encounter Vitals Group     BP 10/15/23 1420 (!) 149/95     Systolic BP Percentile --      Diastolic BP Percentile --      Pulse Rate 10/15/23 1420 73     Resp 10/15/23 1420 18     Temp 10/15/23 1420 98.3 F (36.8 C)     Temp Source 10/15/23 2056 Oral     SpO2 10/15/23 1420 98 %     Weight 10/15/23 1420 162 lb (73.5 kg)     Height 10/15/23 1420 5\' 4"  (1.626 m)     Head Circumference --      Peak Flow --      Pain Score 10/15/23 1420 0     Pain Loc --      Pain Education --      Exclude from Growth Chart --     Most recent vital signs: Vitals:   10/15/23 2056 10/15/23 2356  BP: (!) 151/86 132/88  Pulse:  (!) 58  Resp:  16  Temp: 98.1 F (36.7 C)   SpO2:  100%    Physical Exam Constitutional:      Appearance: She is well-developed.  HENT:     Head: Atraumatic.  Eyes:     Conjunctiva/sclera: Conjunctivae normal.  Cardiovascular:     Rate and Rhythm: Regular rhythm.  Pulmonary:     Effort: No respiratory distress.  Abdominal:      General: There is no distension.  Musculoskeletal:        General: Normal range of motion.     Cervical back: Normal range of motion.  Skin:    General: Skin is warm.  Neurological:     Mental Status: She is alert. Mental status is at baseline.     GCS: GCS eye subscore is 4. GCS verbal subscore is 5. GCS motor subscore is 6.     Cranial Nerves: Cranial nerves 2-12 are intact.     Sensory: Sensation is intact.     Motor: Motor function is intact.     Gait: Gait abnormal.     IMPRESSION / MDM / ASSESSMENT AND PLAN / ED COURSE  I reviewed the triage vital signs and the nursing notes.  Differential diagnosis including peripheral vertigo -vestibular neuritis, no signs of acute otitis media.  Possible BPPV, central vertigo.  No concern for vertebral artery dissection.   Differential diagnosis including pneumonia,  pulmonary embolism  EKG  I, Corena Herter, the attending physician, personally viewed and interpreted this ECG.   Rate: Normal  Rhythm: Normal sinus  Axis: Normal  Intervals: Normal  ST&T Change: None  No tachycardic or bradycardic dysrhythmias while on cardiac telemetry.  RADIOLOGY I independently reviewed imaging, my interpretation of imaging: MRI of the brain without obvious abnormality.  Read as no acute findings.  CT scan of the head without signs of intracranial hemorrhage.  Chest x-ray with no signs of pneumonia.  LABS (all labs ordered are listed, but only abnormal results are displayed) Labs interpreted as -    Labs Reviewed  BASIC METABOLIC PANEL - Abnormal; Notable for the following components:      Result Value   Glucose, Bld 101 (*)    Creatinine, Ser 1.24 (*)    GFR, Estimated 49 (*)    All other components within normal limits  CBC - Abnormal; Notable for the following components:   WBC 11.2 (*)    RDW 16.3 (*)    All other components within normal limits  URINALYSIS, ROUTINE W REFLEX MICROSCOPIC - Abnormal; Notable for the following  components:   Color, Urine YELLOW (*)    APPearance CLOUDY (*)    Protein, ur 30 (*)    Leukocytes,Ua TRACE (*)    All other components within normal limits  SARS CORONAVIRUS 2 BY RT PCR  D-DIMER, QUANTITATIVE  CK  MAGNESIUM  CBG MONITORING, ED     MDM    Low risk Wells criteria, D-dimer is negative.  No signs of rhabdomyolysis.  No significant electrolyte abnormalities.  No signs of urinary tract infection.  COVID testing is negative.  Chest x-ray with no signs of pneumonia.  MRI of the brain without acute infarct.  Possible vestibular neuritis -will do a short course of steroids.  Given meclizine.  Will do a short course of doxycycline given ongoing cough.   PROCEDURES:  Critical Care performed: No  Procedures  Patient's presentation is most consistent with acute presentation with potential threat to life or bodily function.   MEDICATIONS ORDERED IN ED: Medications  meclizine (ANTIVERT) tablet 25 mg (25 mg Oral Given 10/15/23 2253)    FINAL CLINICAL IMPRESSION(S) / ED DIAGNOSES   Final diagnoses:  Acute cough  Vertigo     Rx / DC Orders   ED Discharge Orders          Ordered    meclizine (ANTIVERT) 25 MG tablet  3 times daily PRN        10/15/23 2355    doxycycline (ADOXA) 100 MG tablet  2 times daily        10/15/23 2355    predniSONE (DELTASONE) 10 MG tablet  Daily        10/15/23 2355    ondansetron (ZOFRAN-ODT) 4 MG disintegrating tablet  Every 8 hours PRN        10/15/23 2356             Note:  This document was prepared using Dragon voice recognition software and may include unintentional dictation errors.   Corena Herter, MD 10/16/23 0002

## 2023-11-11 ENCOUNTER — Other Ambulatory Visit: Payer: Self-pay

## 2023-11-11 ENCOUNTER — Telehealth: Payer: Self-pay | Admitting: Family Medicine

## 2023-11-11 ENCOUNTER — Other Ambulatory Visit: Payer: Self-pay | Admitting: Family Medicine

## 2023-11-11 DIAGNOSIS — G44209 Tension-type headache, unspecified, not intractable: Secondary | ICD-10-CM

## 2023-11-11 MED ORDER — BUTALBITAL-APAP-CAFFEINE 50-300-40 MG PO CAPS
1.0000 | ORAL_CAPSULE | ORAL | 0 refills | Status: DC | PRN
Start: 2023-11-11 — End: 2024-01-18

## 2023-11-11 NOTE — Telephone Encounter (Signed)
Medication Refill -  Most Recent Primary Care Visit:  Provider: Della Goo F  Department: [facility]  Visit Type: MYCHART VIDEO VISIT  Date: 09/15/2023  Medication:  Butalbital-APAP-Caffeine 50-300-40 MG CAP   Has the patient contacted their pharmacy? No  Is this the correct pharmacy for this prescription? Yes If no, delete pharmacy and type the correct one.  This is the patient's preferred pharmacy:  Has the prescription been filled recently? No  Is the patient out of the medication? Yes  Has the patient been seen for an appointment in the last year OR does the patient have an upcoming appointment? Yes Pt scheduled an earlier appt to next week  Can we respond through MyChart? Yes [id number] - [place], [place] - [address] AT [facility] [address] [facility]

## 2023-11-16 ENCOUNTER — Telehealth: Payer: Self-pay | Admitting: Family Medicine

## 2023-11-16 NOTE — Telephone Encounter (Unsigned)
Copied from CRM [id number]. Topic: General - Other >> Nov 16, 2023  2:07 PM Brandy Johnston wrote: Reason for CRM: The patient has called to request an alternative prescription to [facility] 50-300-40 MG CAPS [[id number]]  The patient would like to be prescribed a cost effective alternative  Please contact further when possible

## 2023-11-16 NOTE — Telephone Encounter (Signed)
Called and informed patient to check GoodRx. She will check and find cheapest pharmacy for [place] to send to

## 2023-11-17 NOTE — Progress Notes (Unsigned)
Name: Brandy Johnston   MRN: [id number]    DOB: [birth year]   Date:11/18/2023       Progress Note  Subjective  Chief Complaint  Follow Up  HPI  Discussed the use of AI scribe software for clinical note transcription with the patient, who gave verbal consent to proceed.  History of Present Illness   The patient, with a history of smoking, presented with a persistent cough that has been ongoing for two months. The cough is productive with green sputum, and the patient has been experiencing wheezing. She reported no shortness of breath, fever, chills, nausea, or vomiting. The patient's energy level and appetite remain good, and her weight has been stable.  The patient's cough initially started in September after a trip to [place]. At that time, she was prescribed Advair 100/50 to be taken twice daily. However, the patient's symptoms persisted, and she presented to the emergency room in October due to leg cramps and balance issues plus worsening of cough with productive sputum . During this visit, she underwent a series of tests including a CT scan and MRI brain, EKG, and chest x-rays. D-dimer negative , she was sent home on Doxy   Despite some initial improvement, the patient reported that her wheezing and productive cough returned four days prior to the current visit. She is taking cough medications      Patient Active Problem List   Diagnosis Date Noted   Chronic left shoulder pain 11/09/2022   Hyperlipidemia 01/14/2021   Incomplete tear of left rotator cuff 07/03/2020   Angioedema due to angiotensin converting enzyme inhibitor (ACE-I) 01/29/2020   Chronic bilateral back pain 07/26/2017   Coronary artery calcification 05/17/2017   Heart palpitations 05/15/2017   History of acute gastritis    Leukocytosis 09/30/2016   Atherosclerosis of abdominal aorta (HCC) 08/18/2016   Type 2 diabetes mellitus with stage 3 chronic kidney disease, without long-term current use of insulin (HCC) 12/16/2015    Diabetic neuropathy associated with type 2 diabetes mellitus (HCC) 09/18/2015   Insomnia 09/18/2015   Benign essential HTN 06/18/2015   Chronic kidney disease (CKD), stage III (moderate) (HCC) 06/18/2015   Diabetes mellitus with renal manifestation (HCC) 06/18/2015   Obesity (BMI 30.0-34.9) 06/18/2015   Degenerative arthritis of hip 06/18/2015   Sickle cell trait (HCC) 06/18/2015   Dyslipidemia 05/27/2010   Gastro-esophageal reflux disease without esophagitis 05/25/2008    Past Surgical History:  Procedure Laterality Date   BREAST BIOPSY Right 12/28/2019   stereo [facility] stromal fibrosis   CHOLECYSTECTOMY     COLONOSCOPY     ESOPHAGOGASTRODUODENOSCOPY (EGD) WITH PROPOFOL N/A 10/05/2016   Procedure: ESOPHAGOGASTRODUODENOSCOPY (EGD) WITH PROPOFOL;  Surgeon: Midge Minium, MD;  Location: [facility];  Service: Endoscopy;  Laterality: N/A;   FRACTURE SURGERY Left    cast and pins    SHOULDER ARTHROSCOPY WITH ROTATOR CUFF REPAIR AND SUBACROMIAL DECOMPRESSION Left 12/07/2018   Procedure: left shoulder manipulation under anesthesia, left shoulder arthroscopic lysis of adhesions;  Surgeon: Lyndle Herrlich, MD;  Location: [facility];  Service: Orthopedics;  Laterality: Left;   SHOULDER CLOSED REDUCTION Left 12/07/2018   Procedure: CLOSED MANIPULATION SHOULDER;  Surgeon: Lyndle Herrlich, MD;  Location: [facility];  Service: Orthopedics;  Laterality: Left;   shoulder surgery  Left 06/10/2018   Dr. Derryl Harbor   TUBAL LIGATION      Family History  Problem Relation Age of Onset   Migraines Mother    Diabetes Mother    Cancer Mother  lung   Arthritis Brother    Breast cancer Maternal Aunt    Cancer Maternal Uncle        Lung and Colon   Cirrhosis Brother    Breast cancer Cousin     Social History   Tobacco Use   Smoking status: Never   Smokeless tobacco: Never  Substance Use Topics   Alcohol use: Yes    Alcohol/week: 0.0 standard drinks of alcohol    Comment: rare      Current Outpatient Medications:    aspirin (ASPIRIN 81) 81 MG chewable tablet, Chew 1 tablet (81 mg total) by mouth daily., Disp: 30 tablet, Rfl: 0   azithromycin (ZITHROMAX) 250 MG tablet, Take 2 tablets on day 1, then 1 tablet daily on days 2 through 5, Disp: 6 tablet, Rfl: 0   baclofen (LIORESAL) 10 MG tablet, Take 1 tablet (10 mg total) by mouth 3 (three) times daily., Disp: 90 each, Rfl: 0   Blood Glucose Monitoring Suppl (CONTOUR NEXT ONE) KIT, USE TO TEST BLOOD SUGAR ONCE D, Disp: , Rfl:    Budeson-Glycopyrrol-Formoterol (BREZTRI AEROSPHERE) 160-9-4.8 MCG/ACT AERO, Inhale 2 puffs into the lungs 2 (two) times daily., Disp: 10.7 g, Rfl: 0   Butalbital-APAP-Caffeine 50-300-40 MG CAPS, Take 1 capsule by mouth every 4 (four) hours as needed., Disp: 40 capsule, Rfl: 0   Cholecalciferol (VITAMIN D) 2000 units CAPS, Take 1 capsule (2,000 Units total) by mouth daily., Disp: 30 capsule, Rfl: 0   diclofenac Sodium (VOLTAREN) 1 % GEL, Apply 2 g topically 4 (four) times daily., Disp: 100 g, Rfl: 2   diltiazem (CARDIZEM CD) 180 MG 24 hr capsule, Take 1 capsule (180 mg total) by mouth daily., Disp: 90 capsule, Rfl: 1   ezetimibe (ZETIA) 10 MG tablet, Take 1 tablet (10 mg total) by mouth daily., Disp: 90 tablet, Rfl: 3   famotidine (PEPCID) 20 MG tablet, Take 1 tablet (20 mg total) by mouth 2 (two) times daily., Disp: 180 tablet, Rfl: 1   gabapentin (NEURONTIN) 300 MG capsule, Take 1 capsule (300 mg total) by mouth 3 (three) times daily., Disp: 270 capsule, Rfl: 1   glucose blood test strip, Use as instructed, Disp: 100 each, Rfl: 12   loratadine (CLARITIN) 10 MG tablet, Take by mouth., Disp: , Rfl:    magnesium oxide (MAG-OX) 400 MG tablet, Take 1 tablet (400 mg total) by mouth 2 (two) times daily., Disp: 180 tablet, Rfl: 1   meclizine (ANTIVERT) 25 MG tablet, Take 1 tablet (25 mg total) by mouth 3 (three) times daily as needed for dizziness., Disp: 30 tablet, Rfl: 0   Microlet Lancets MISC, USE TO  CHECK BLOOD SUGAR ONCE D, Disp: , Rfl:    ondansetron (ZOFRAN-ODT) 4 MG disintegrating tablet, Take 1 tablet (4 mg total) by mouth every 8 (eight) hours as needed for nausea or vomiting., Disp: 30 tablet, Rfl: 0   predniSONE (DELTASONE) 10 MG tablet, Take 1 tablet (10 mg total) by mouth daily with breakfast., Disp: 10 tablet, Rfl: 0   rosuvastatin (CRESTOR) 40 MG tablet, Take 1 tablet (40 mg total) by mouth daily., Disp: 90 tablet, Rfl: 3   Semaglutide,0.25 or 0.5MG /DOS, (OZEMPIC, 0.25 OR 0.5 MG/DOSE,) 2 MG/3ML SOPN, Inject 0.5 mg into the muscle once a week., Disp: 6 mL, Rfl: 0   traZODone (DESYREL) 50 MG tablet, TAKE 1 TABLET(50 MG) BY MOUTH AT BEDTIME, Disp: 90 tablet, Rfl: 0   triamcinolone cream (KENALOG) 0.1 %, Apply 1 Application topically 2 (two) times  daily., Disp: 453.6 g, Rfl: 0   benzonatate (TESSALON) 100 MG capsule, Take 2 capsules (200 mg total) by mouth 3 (three) times daily as needed for cough., Disp: 40 capsule, Rfl: 0   promethazine-dextromethorphan (PROMETHAZINE-DM) 6.25-15 MG/5ML syrup, Take 5 mLs by mouth 4 (four) times daily as needed for cough., Disp: 118 mL, Rfl: 0  Allergies  Allergen Reactions   Ace Inhibitors Swelling    Angioedema    Lisinopril Swelling    Face and neck swelling    I personally reviewed active problem list, medication list, allergies, family history, social history, health maintenance with the patient/caregiver today.   ROS  Ten systems reviewed and is negative except as mentioned in HPI    Objective  Vitals:   11/18/23 0923  BP: 134/84  Pulse: 79  Resp: 16  Temp: 98.4 F (36.9 C)  TempSrc: Oral  SpO2: 98%  Weight: 168 lb 12.8 oz (76.6 kg)  Height: 5\' 4"  (1.626 m)    Body mass index is 28.97 kg/m.  Physical Exam   Constitutional: Patient appears well-developed and well-nourished. No distress.  HEENT: head atraumatic, normocephalic, pupils equal and reactive to light, neck supple, throat showed mild postnasal  drainage Cardiovascular: Normal rate, regular rhythm and normal heart sounds.  No murmur heard. No BLE edema. Pulmonary/Chest: Effort normal diffuse in and expiratory rhonchi, audible wheezing Abdominal: Soft.  There is no tenderness. Psychiatric: Patient has a normal mood and affect. behavior is normal. Judgment and thought content normal.     PHQ2/9:    11/18/2023    9:23 AM 09/15/2023    9:26 AM 09/09/2023    9:37 AM 09/08/2023    9:50 AM 08/06/2023    2:02 PM  Depression screen PHQ 2/9  Decreased Interest 0 0 0 0 0  Down, Depressed, Hopeless 0 0 0 0 0  PHQ - 2 Score 0 0 0 0 0  Altered sleeping 0 0 0 0 0  Tired, decreased energy 0 0 0 0 0  Change in appetite 0 0 0 0 0  Feeling bad or failure about yourself  0 0 0 0 0  Trouble concentrating 0 0 0 0 0  Moving slowly or fidgety/restless 0 0 0 0 0  Suicidal thoughts 0 0 0 0 0  PHQ-9 Score 0 0 0 0 0  Difficult doing work/chores  Not difficult at all Not difficult at all      phq 9 is negative   Fall Risk:    11/18/2023    9:23 AM 09/15/2023    9:26 AM 09/09/2023    9:37 AM 09/08/2023    9:50 AM 08/06/2023    2:02 PM  Fall Risk   Falls in the past year? 0 0 0 0 0  Number falls in past yr:  0 0 0 0  Injury with Fall?  0 0 0 0  Risk for fall due to : No Fall Risks No Fall Risks No Fall Risks No Fall Risks No Fall Risks  Follow up Falls prevention discussed Falls prevention discussed;Education provided;Falls evaluation completed Falls prevention discussed;Education provided;Falls evaluation completed Falls prevention discussed Falls prevention discussed     Assessment & Plan  1. Persistent cough  - promethazine-dextromethorphan (PROMETHAZINE-DM) 6.25-15 MG/5ML syrup; Take 5 mLs by mouth 4 (four) times daily as needed for cough.  Dispense: 118 mL; Refill: 0 - benzonatate (TESSALON) 100 MG capsule; Take 2 capsules (200 mg total) by mouth 3 (three) times daily as needed for cough.  Dispense: 40 capsule; Refill: 0 - CT Chest W  Contrast; Future - Ambulatory referral to Pulmonology - azithromycin (ZITHROMAX) 250 MG tablet; Take 2 tablets on day 1, then 1 tablet daily on days 2 through 5  Dispense: 6 tablet; Refill: 0 - predniSONE (DELTASONE) 10 MG tablet; Take 1 tablet (10 mg total) by mouth daily with breakfast.  Dispense: 10 tablet; Refill: 0 - CBC with Differential/Platelet - COMPLETE METABOLIC PANEL WITH GFR  2. Need for immunization against influenza  - Flu vaccine trivalent PF, 6mos and older(Flulaval,Afluria,Fluarix,Fluzone)

## 2023-11-18 ENCOUNTER — Ambulatory Visit (INDEPENDENT_AMBULATORY_CARE_PROVIDER_SITE_OTHER): Payer: Medicare HMO | Admitting: Family Medicine

## 2023-11-18 ENCOUNTER — Encounter: Payer: Self-pay | Admitting: Family Medicine

## 2023-11-18 VITALS — BP 134/84 | HR 79 | Temp 98.4°F | Resp 16 | Ht 64.0 in | Wt 168.8 lb

## 2023-11-18 DIAGNOSIS — Z23 Encounter for immunization: Secondary | ICD-10-CM | POA: Diagnosis not present

## 2023-11-18 DIAGNOSIS — R053 Chronic cough: Secondary | ICD-10-CM

## 2023-11-18 DIAGNOSIS — N1831 Chronic kidney disease, stage 3a: Secondary | ICD-10-CM

## 2023-11-18 MED ORDER — AZITHROMYCIN 250 MG PO TABS
ORAL_TABLET | ORAL | 0 refills | Status: AC
Start: 2023-11-18 — End: 2023-11-23

## 2023-11-18 MED ORDER — PROMETHAZINE-DM 6.25-15 MG/5ML PO SYRP
5.0000 mL | ORAL_SOLUTION | Freq: Four times a day (QID) | ORAL | 0 refills | Status: DC | PRN
Start: 2023-11-18 — End: 2023-12-09

## 2023-11-18 MED ORDER — BENZONATATE 100 MG PO CAPS
200.0000 mg | ORAL_CAPSULE | Freq: Three times a day (TID) | ORAL | 0 refills | Status: DC | PRN
Start: 2023-11-18 — End: 2023-12-09

## 2023-11-18 MED ORDER — PREDNISONE 10 MG PO TABS
10.0000 mg | ORAL_TABLET | Freq: Every day | ORAL | 0 refills | Status: DC
Start: 2023-11-18 — End: 2023-12-16

## 2023-11-19 LAB — CBC WITH DIFFERENTIAL/PLATELET
Absolute Lymphocytes: 2743 {cells}/uL (ref 850–3900)
Absolute Monocytes: 713 {cells}/uL (ref 200–950)
Basophils Absolute: 86 {cells}/uL (ref 0–200)
Basophils Relative: 0.8 %
Eosinophils Absolute: 583 {cells}/uL — ABNORMAL HIGH (ref 15–500)
Eosinophils Relative: 5.4 %
HCT: 41.1 % (ref 35.0–45.0)
Hemoglobin: 13.2 g/dL (ref 11.7–15.5)
MCH: 26.9 pg — ABNORMAL LOW (ref 27.0–33.0)
MCHC: 32.1 g/dL (ref 32.0–36.0)
MCV: 83.7 fL (ref 80.0–100.0)
MPV: 11.2 fL (ref 7.5–12.5)
Monocytes Relative: 6.6 %
Neutro Abs: 6674 {cells}/uL (ref 1500–7800)
Neutrophils Relative %: 61.8 %
Platelets: 241 10*3/uL (ref 140–400)
RBC: 4.91 10*6/uL (ref 3.80–5.10)
RDW: 15.2 % — ABNORMAL HIGH (ref 11.0–15.0)
Total Lymphocyte: 25.4 %
WBC: 10.8 10*3/uL (ref 3.8–10.8)

## 2023-11-19 LAB — COMPLETE METABOLIC PANEL WITH GFR
AG Ratio: 1.2 (calc) (ref 1.0–2.5)
ALT: 12 U/L (ref 6–29)
AST: 18 U/L (ref 10–35)
Albumin: 4.1 g/dL (ref 3.6–5.1)
Alkaline phosphatase (APISO): 118 U/L (ref 37–153)
BUN/Creatinine Ratio: 9 (calc) (ref 6–22)
BUN: 11 mg/dL (ref 7–25)
CO2: 27 mmol/L (ref 20–32)
Calcium: 9.2 mg/dL (ref 8.6–10.4)
Chloride: 104 mmol/L (ref 98–110)
Creat: 1.2 mg/dL — ABNORMAL HIGH (ref 0.50–1.05)
Globulin: 3.3 g/dL (ref 1.9–3.7)
Glucose, Bld: 81 mg/dL (ref 65–99)
Potassium: 3.9 mmol/L (ref 3.5–5.3)
Sodium: 141 mmol/L (ref 135–146)
Total Bilirubin: 0.3 mg/dL (ref 0.2–1.2)
Total Protein: 7.4 g/dL (ref 6.1–8.1)
eGFR: 51 mL/min/{1.73_m2} — ABNORMAL LOW (ref >=60)

## 2023-11-21 ENCOUNTER — Other Ambulatory Visit: Payer: Self-pay | Admitting: Family Medicine

## 2023-11-23 ENCOUNTER — Other Ambulatory Visit: Payer: Self-pay | Admitting: Family Medicine

## 2023-11-23 DIAGNOSIS — R790 Abnormal level of blood mineral: Secondary | ICD-10-CM

## 2023-11-23 NOTE — Telephone Encounter (Signed)
Medication Refill -  Most Recent Primary Care Visit:  Provider: Alba Cory  Department: [facility]  Visit Type: OFFICE VISIT  Date: 11/18/2023  Medication: magnesium oxide (MAG-OX) 400 MG tablet   Has the patient contacted their pharmacy? Yes   Is this the correct pharmacy for this prescription? No If no, delete pharmacy and type the correct one.  This is the patient's preferred pharmacy:   [id number] - [place], [place] - [address] AT [facility] OF [address] [facility] Phone: [phone]  Fax: [phone]        Has the prescription been filled recently? No  Is the patient out of the medication? No  Has the patient been seen for an appointment in the last year OR does the patient have an upcoming appointment? Yes  Can we respond through MyChart? Yes  The patient only has a few pills left. Please assist patient further

## 2023-11-24 ENCOUNTER — Encounter: Payer: Self-pay | Admitting: Student in an Organized Health Care Education/Training Program

## 2023-11-24 MED ORDER — MAGNESIUM OXIDE 400 MG PO TABS: 400.0000 mg | ORAL_TABLET | Freq: Two times a day (BID) | ORAL | 1 refills | Status: DC

## 2023-11-24 NOTE — Telephone Encounter (Signed)
Requested medication (s) are due for refill today: yes  Requested medication (s) are on the active medication list: yes  Last refill:  05/16/21  Future visit scheduled:yes  Notes to clinic:  med has not been reordered since 2022   Requested Prescriptions  Pending Prescriptions Disp Refills   magnesium oxide (MAG-OX) 400 MG tablet 180 tablet 1    Sig: Take 1 tablet (400 mg total) by mouth 2 (two) times daily.     Endocrinology:  Minerals - Magnesium Supplementation Failed - 11/23/2023  5:21 PM      Failed - Cr in normal range and within 360 days    Creat  Date Value Ref Range Status  11/18/2023 1.20 (H) 0.50 - 1.05 mg/dL Final   Creatinine, Urine  Date Value Ref Range Status  11/09/2022 181 20 - 275 mg/dL Final         Passed - Mg Level in normal range and within 360 days    Magnesium  Date Value Ref Range Status  10/15/2023 2.3 1.7 - 2.4 mg/dL Final    Comment:    Performed at [facility], [address], [place], [place] [address]         Passed - Valid encounter within last 12 months    Recent Outpatient Visits           6 days ago Persistent cough   [facility] Alba Cory, MD   2 months ago Viral upper respiratory tract infection   [facility] Della Goo F, FNP   2 months ago Muscle spasms of both lower extremities   [facility] Danelle Berry, PA-C   2 months ago Type 2 diabetes mellitus with stage 3a chronic kidney disease, without long-term current use of insulin [facility])   [facility] Alba Cory, MD   3 months ago Wheezing   [facility] Alba Cory, MD       Future Appointments             In 1 month Carlynn Purl, Danna Hefty, MD [facility], [facility]

## 2023-11-30 ENCOUNTER — Ambulatory Visit
Admission: RE | Admit: 2023-11-30 | Discharge: 2023-11-30 | Disposition: A | Discharge status: Home / Self Care | Payer: Medicare HMO | Source: Ambulatory Visit | Attending: Family Medicine | Admitting: Family Medicine

## 2023-11-30 DIAGNOSIS — R053 Chronic cough: Secondary | ICD-10-CM | POA: Diagnosis not present

## 2023-11-30 DIAGNOSIS — I700 Atherosclerosis of aorta: Secondary | ICD-10-CM | POA: Diagnosis not present

## 2023-11-30 MED ORDER — IOHEXOL 300 MG/ML  SOLN
75.0000 mL | Freq: Once | INTRAMUSCULAR | Status: AC | PRN
  Administered 2023-11-30: 75 mL via INTRAVENOUS

## 2023-12-02 ENCOUNTER — Telehealth: Payer: Self-pay | Admitting: *Deleted

## 2023-12-02 ENCOUNTER — Ambulatory Visit: Payer: Self-pay | Admitting: *Deleted

## 2023-12-02 DIAGNOSIS — R062 Wheezing: Secondary | ICD-10-CM | POA: Diagnosis not present

## 2023-12-02 DIAGNOSIS — E119 Type 2 diabetes mellitus without complications: Secondary | ICD-10-CM | POA: Diagnosis not present

## 2023-12-02 DIAGNOSIS — J069 Acute upper respiratory infection, unspecified: Secondary | ICD-10-CM | POA: Diagnosis not present

## 2023-12-02 LAB — HM DIABETES EYE EXAM

## 2023-12-02 NOTE — Telephone Encounter (Signed)
  Chief Complaint: Results Symptoms: NA Frequency: NA Pertinent Negatives: Patient denies NA Disposition: [] ED /[] Urgent Care (no appt availability in office) / [] Appointment(In office/virtual)/ []  [facility]/ [] Home Care/ [] Refused Recommended Disposition /[] [facility]/ []  Follow-up with PCP Additional Notes:   Pt calling fro eye doctor. States they are asking whether last A1C was checked and value. Advised 09/08/23 at 6.5  Information provided.

## 2023-12-02 NOTE — Telephone Encounter (Signed)
   Chief Complaint: cough, wheezing Symptoms: chronic cough- getting worse, wheezing Frequency: ongoing- seemed to get better after last appointment- now worse Pertinent Negatives: Patient denies chest pain Disposition: [] ED /[x] Urgent Care (no appt availability in office) / [] Appointment(In office/virtual)/ []  [facility]/ [] Home Care/ [] Refused Recommended Disposition /[] [facility]/ []  Follow-up with PCP Additional Notes: No open appointment - UC advised.   Reason for Disposition  Wheezing is present  Answer Assessment - Initial Assessment Questions 1. RESPIRATORY STATUS: "Describe your breathing?" (e.g., wheezing, shortness of breath, unable to speak, severe coughing)      Coughing, wheezing worse since visit 2. ONSET: "When did this breathing problem begin?"      4-5 days- worse, August/Sept 3. PATTERN "Does the difficult breathing come and go, or has it been constant since it started?"      constant 4. SEVERITY: "How bad is your breathing?" (e.g., mild, moderate, severe)    - MILD: No SOB at rest, mild SOB with walking, speaks normally in sentences, can lie down, no retractions, pulse < 100.    - MODERATE: SOB at rest, SOB with minimal exertion and prefers to sit, cannot lie down flat, speaks in phrases, mild retractions, audible wheezing, pulse 100-120.    - SEVERE: Very SOB at rest, speaks in single words, struggling to breathe, sitting hunched forward, retractions, pulse > 120      Coughing only 5. RECURRENT SYMPTOM: "Have you had difficulty breathing before?" If Yes, ask: "When was the last time?" and "What happened that time?"      no 6. CARDIAC HISTORY: "Do you have any history of heart disease?" (e.g., heart attack, angina, bypass surgery, angioplasty)      Yes- hypertension 7. LUNG HISTORY: "Do you have any history of lung disease?"  (e.g., pulmonary embolus, asthma, emphysema)     no 8. CAUSE: "What do you think is causing the breathing problem?"       Not sure- patient was given cough medication at last appointment, antibiotic 9. OTHER SYMPTOMS: "Do you have any other symptoms? (e.g., dizziness, runny nose, cough, chest pain, fever)     Cough- slight tint- not as thick  Protocols used: Breathing Difficulty-A-AH, Cough - Acute Productive-A-AH

## 2023-12-07 DIAGNOSIS — R062 Wheezing: Secondary | ICD-10-CM | POA: Diagnosis not present

## 2023-12-07 DIAGNOSIS — R000 Tachycardia, unspecified: Secondary | ICD-10-CM | POA: Diagnosis not present

## 2023-12-07 DIAGNOSIS — R0602 Shortness of breath: Secondary | ICD-10-CM | POA: Diagnosis not present

## 2023-12-07 DIAGNOSIS — J40 Bronchitis, not specified as acute or chronic: Secondary | ICD-10-CM | POA: Diagnosis not present

## 2023-12-07 DIAGNOSIS — D72829 Elevated white blood cell count, unspecified: Secondary | ICD-10-CM | POA: Diagnosis not present

## 2023-12-07 DIAGNOSIS — E1142 Type 2 diabetes mellitus with diabetic polyneuropathy: Secondary | ICD-10-CM | POA: Diagnosis not present

## 2023-12-07 DIAGNOSIS — Z20822 Contact with and (suspected) exposure to covid-19: Secondary | ICD-10-CM | POA: Diagnosis not present

## 2023-12-07 DIAGNOSIS — R0600 Dyspnea, unspecified: Secondary | ICD-10-CM | POA: Diagnosis not present

## 2023-12-07 DIAGNOSIS — R0789 Other chest pain: Secondary | ICD-10-CM | POA: Diagnosis not present

## 2023-12-07 DIAGNOSIS — R058 Other specified cough: Secondary | ICD-10-CM | POA: Diagnosis not present

## 2023-12-07 DIAGNOSIS — R059 Cough, unspecified: Secondary | ICD-10-CM | POA: Diagnosis not present

## 2023-12-07 DIAGNOSIS — E7800 Pure hypercholesterolemia, unspecified: Secondary | ICD-10-CM | POA: Diagnosis not present

## 2023-12-08 ENCOUNTER — Ambulatory Visit: Payer: Medicare HMO | Admitting: Family Medicine

## 2023-12-08 ENCOUNTER — Ambulatory Visit: Payer: Self-pay | Admitting: *Deleted

## 2023-12-08 DIAGNOSIS — R059 Cough, unspecified: Secondary | ICD-10-CM | POA: Diagnosis not present

## 2023-12-08 DIAGNOSIS — R0602 Shortness of breath: Secondary | ICD-10-CM | POA: Diagnosis not present

## 2023-12-08 DIAGNOSIS — R062 Wheezing: Secondary | ICD-10-CM | POA: Diagnosis not present

## 2023-12-08 NOTE — Telephone Encounter (Signed)
Summary: wheezing   Pt stated that she was seen at [facility] and given 3 breathing treatments but she is still wheezing. Pt requests call back to advise.     Chief Complaint: Wheezing Symptoms: Wheezing, SOB Frequency: Seen at [facility] today Pertinent Negatives: Patient denies  Disposition: [] ED /[] Urgent Care (no appt availability in office) / [] Appointment(In office/virtual)/ []  [facility]/ [] Home Care/ [] Refused Recommended Disposition /[] [facility]/ [x]  Follow-up with PCP Additional Notes:  Pt states had 3 neb txs at [facility] today, still wheezing some. Called to request home nebs treatments. States O2 sat 96% presently HR 101. Assured pt NT would route to practice for PCPs review and final disposition. Advised ED for any worsening symptoms.   Wants PCP to know she also went to [facility] 12/02/23 for treatments.  Reason for Disposition  [1] MODERATE longstanding difficulty breathing (e.g., speaks in phrases, SOB even at rest, pulse 100-120) AND [2] SAME as normal    Worsening but seen at [facility]  Answer Assessment - Initial Assessment Questions 1. RESPIRATORY STATUS: "Describe your breathing?" (e.g., wheezing, shortness of breath, unable to speak, severe coughing)      Wheezing 2. ONSET: "When did this breathing problem begin?"      August 3. PATTERN "Does the difficult breathing come and go, or has it been constant since it started?"      Comes and goes 4. SEVERITY: "How bad is your breathing?" (e.g., mild, moderate, severe)    - MILD: No SOB at rest, mild SOB with walking, speaks normally in sentences, can lie down, no retractions, pulse < 100.    - MODERATE: SOB at rest, SOB with minimal exertion and prefers to sit, cannot lie down flat, speaks in phrases, mild retractions, audible wheezing, pulse 100-120.    - SEVERE: Very SOB at rest, speaks in single words, struggling to breathe, sitting hunched forward, retractions, pulse > 120      Varies 5. RECURRENT  SYMPTOM: "Have you had difficulty breathing before?" If Yes, ask: "When was the last time?" and "What happened that time?"      yes 6. CARDIAC HISTORY: "Do you have any history of heart disease?" (e.g., heart attack, angina, bypass surgery, angioplasty)      *No Answer* 7. LUNG HISTORY: "Do you have any history of lung disease?"  (e.g., pulmonary embolus, asthma, emphysema)     *No Answer* 8. CAUSE: "What do you think is causing the breathing problem?"       9. OTHER SYMPTOMS: "Do you have any other symptoms? (e.g., dizziness, runny nose, cough, chest pain, fever)     Chest tightness, fever a while ago 10. O2 SATURATION MONITOR:  "Do you use an oxygen saturation monitor (pulse oximeter) at home?" If Yes, ask: "What is your reading (oxygen level) today?" "What is your usual oxygen saturation reading?" (e.g., 95%)      O2 sat  96%  HR 101  Protocols used: Breathing Difficulty-A-AH

## 2023-12-09 ENCOUNTER — Encounter: Payer: Self-pay | Admitting: Nurse Practitioner

## 2023-12-09 ENCOUNTER — Telehealth: Payer: Self-pay | Admitting: Family Medicine

## 2023-12-09 ENCOUNTER — Ambulatory Visit: Payer: Medicare HMO | Admitting: Nurse Practitioner

## 2023-12-09 VITALS — BP 134/82 | HR 97 | Temp 97.9°F | Resp 20 | Ht 64.0 in | Wt 170.2 lb

## 2023-12-09 DIAGNOSIS — J42 Unspecified chronic bronchitis: Secondary | ICD-10-CM

## 2023-12-09 DIAGNOSIS — J45909 Unspecified asthma, uncomplicated: Secondary | ICD-10-CM | POA: Diagnosis not present

## 2023-12-09 DIAGNOSIS — R062 Wheezing: Secondary | ICD-10-CM

## 2023-12-09 DIAGNOSIS — R053 Chronic cough: Secondary | ICD-10-CM | POA: Diagnosis not present

## 2023-12-09 DIAGNOSIS — R0602 Shortness of breath: Secondary | ICD-10-CM | POA: Diagnosis not present

## 2023-12-09 MED ORDER — TRELEGY ELLIPTA 100-62.5-25 MCG/ACT IN AEPB: 1.0000 | INHALATION_SPRAY | Freq: Every day | RESPIRATORY_TRACT | Status: DC

## 2023-12-09 MED ORDER — PROMETHAZINE-DM 6.25-15 MG/5ML PO SYRP: 5.0000 mL | ORAL_SOLUTION | Freq: Four times a day (QID) | ORAL | 0 refills | Status: DC | PRN

## 2023-12-09 MED ORDER — IPRATROPIUM-ALBUTEROL 0.5-2.5 (3) MG/3ML IN SOLN: 3.0000 mL | Freq: Four times a day (QID) | RESPIRATORY_TRACT | 1 refills | Status: DC | PRN

## 2023-12-09 NOTE — Progress Notes (Signed)
BP 134/82 (BP Location: Left Arm, Patient Position: Sitting, Cuff Size: Large)   Pulse 97   Temp 97.9 F (36.6 C) (Oral)   Resp 20   Ht 5\' 4"  (1.626 m) Comment: per chart  Wt 170 lb 3.2 oz (77.2 kg)   SpO2 97%   BMI 29.21 kg/m    Subjective:    Patient ID: Brandy Johnston, female    DOB: 09-22-1961, 62 y.o.   MRN: [id number]  HPI: Brandy Johnston is a 62 y.o. female  Chief Complaint  Patient presents with   Medical Management of Chronic Issues   Cough   Wheezing    Discussed the use of AI scribe software for clinical note transcription with the patient, who gave verbal consent to proceed.  History of Present Illness   The patient, with a history of respiratory issues, presents with persistent wheezing and cough that began at the end of August. Initially presenting as a slight cold, the symptoms have since progressed to wheezing, which has not improved despite multiple treatments. The patient has sought care at an urgent care center and a hospital emergency room, where they received three breathing treatments. They are currently on prednisone, an albuterol inhaler, and azithromycin (Z-Pak). They also take [facility], which has not provided significant relief. The patient has an upcoming appointment with pulmonology. They have also been prescribed promethazine DM syrup and have requested a home nebulizer machine. The patient has had a chest x-ray, which was reported as clear, and had a CT scan but not resulted yet.       12/09/2023   11:21 AM 11/18/2023    9:23 AM 09/15/2023    9:26 AM  Depression screen PHQ 2/9  Decreased Interest 0 0 0  Down, Depressed, Hopeless 0 0 0  PHQ - 2 Score 0 0 0  Altered sleeping  0 0  Tired, decreased energy  0 0  Change in appetite  0 0  Feeling bad or failure about yourself   0 0  Trouble concentrating  0 0  Moving slowly or fidgety/restless  0 0  Suicidal thoughts  0 0  PHQ-9 Score  0 0  Difficult doing work/chores   Not difficult at all     Relevant past medical, surgical, family and social history reviewed and updated as indicated. Interim medical history since our last visit reviewed. Allergies and medications reviewed and updated.  Review of Systems  Ten systems reviewed and is negative except as mentioned in HPI       Objective:    BP 134/82 (BP Location: Left Arm, Patient Position: Sitting, Cuff Size: Large)   Pulse 97   Temp 97.9 F (36.6 C) (Oral)   Resp 20   Ht 5\' 4"  (1.626 m) Comment: per chart  Wt 170 lb 3.2 oz (77.2 kg)   SpO2 97%   BMI 29.21 kg/m    Wt Readings from Last 3 Encounters:  12/09/23 170 lb 3.2 oz (77.2 kg)  11/18/23 168 lb 12.8 oz (76.6 kg)  10/15/23 162 lb (73.5 kg)    Physical Exam  Constitutional: Patient appears well-developed and well-nourished.  No distress.  HEENT: head atraumatic, normocephalic, pupils equal and reactive to light, neck supple Cardiovascular: Normal rate, regular rhythm and normal heart sounds.  No murmur heard. No BLE edema. Pulmonary/Chest: Effort normal and breath sounds wheezing. No respiratory distress. Abdominal: Soft.  There is no tenderness. Psychiatric: Patient has a normal mood and affect. behavior is normal. Judgment  and thought content normal.  Results for orders placed or performed in visit on 11/18/23  CBC with Differential/Platelet   Collection Time: 11/18/23 10:09 AM  Result Value Ref Range   WBC 10.8 3.8 - 10.8 Thousand/uL   RBC 4.91 3.80 - 5.10 Million/uL   Hemoglobin 13.2 11.7 - 15.5 g/dL   HCT 16.1 09.6 - 04.5 %   MCV 83.7 80.0 - 100.0 fL   MCH 26.9 (L) 27.0 - 33.0 pg   MCHC 32.1 32.0 - 36.0 g/dL   RDW 40.9 (H) 81.1 - 91.4 %   Platelets 241 140 - 400 Thousand/uL   MPV 11.2 7.5 - 12.5 fL   Neutro Abs 6,674 1,500 - 7,800 cells/uL   Absolute Lymphocytes 2,743 850 - 3,900 cells/uL   Absolute Monocytes 713 200 - 950 cells/uL   Eosinophils Absolute 583 (H) 15 - 500 cells/uL   Basophils Absolute 86 0 - 200 cells/uL   Neutrophils  Relative % 61.8 %   Total Lymphocyte 25.4 %   Monocytes Relative 6.6 %   Eosinophils Relative 5.4 %   Basophils Relative 0.8 %  COMPLETE METABOLIC PANEL WITH GFR   Collection Time: 11/18/23 10:09 AM  Result Value Ref Range   Glucose, Bld 81 65 - 99 mg/dL   BUN 11 7 - 25 mg/dL   Creat 7.82 (H) 9.56 - 1.05 mg/dL   eGFR 51 (L) > OR = 60 mL/min/1.4m2   BUN/Creatinine Ratio 9 6 - 22 (calc)   Sodium 141 135 - 146 mmol/L   Potassium 3.9 3.5 - 5.3 mmol/L   Chloride 104 98 - 110 mmol/L   CO2 27 20 - 32 mmol/L   Calcium 9.2 8.6 - 10.4 mg/dL   Total Protein 7.4 6.1 - 8.1 g/dL   Albumin 4.1 3.6 - 5.1 g/dL   Globulin 3.3 1.9 - 3.7 g/dL (calc)   AG Ratio 1.2 1.0 - 2.5 (calc)   Total Bilirubin 0.3 0.2 - 1.2 mg/dL   Alkaline phosphatase (APISO) 118 37 - 153 U/L   AST 18 10 - 35 U/L   ALT 12 6 - 29 U/L       Assessment & Plan:   Problem List Items Addressed This Visit   None Visit Diagnoses       Persistent cough    -  Primary   Relevant Medications   promethazine-dextromethorphan (PROMETHAZINE-DM) 6.25-15 MG/5ML syrup   ipratropium-albuterol (DUONEB) 0.5-2.5 (3) MG/3ML SOLN   Fluticasone-Umeclidin-Vilant (TRELEGY ELLIPTA) 100-62.5-25 MCG/ACT AEPB   Other Relevant Orders   For home use only DME Nebulizer machine     Chronic bronchitis, unspecified chronic bronchitis type (HCC)       Relevant Medications   ipratropium-albuterol (DUONEB) 0.5-2.5 (3) MG/3ML SOLN   Fluticasone-Umeclidin-Vilant (TRELEGY ELLIPTA) 100-62.5-25 MCG/ACT AEPB   Other Relevant Orders   For home use only DME Nebulizer machine     Wheezing       Relevant Medications   ipratropium-albuterol (DUONEB) 0.5-2.5 (3) MG/3ML SOLN   Fluticasone-Umeclidin-Vilant (TRELEGY ELLIPTA) 100-62.5-25 MCG/ACT AEPB   Other Relevant Orders   For home use only DME Nebulizer machine     Reactive airway disease with wheezing without complication, unspecified asthma severity, unspecified whether persistent       Relevant Medications    ipratropium-albuterol (DUONEB) 0.5-2.5 (3) MG/3ML SOLN   Fluticasone-Umeclidin-Vilant (TRELEGY ELLIPTA) 100-62.5-25 MCG/ACT AEPB   Other Relevant Orders   For home use only DME Nebulizer machine        Assessment and Plan  chronic bronchitis/shortness of breath/cough Persistent wheezing and cough since end of August. Recent ER visit with multiple nebulizer treatments. Current medications include Prednisone, Albuterol, Breztri, Tessalon Perles, and Azithromycin. Chest X-ray and CT scan -Start Trelegy inhaler, one puff once a day. -Continue Albuterol as needed. -continue azithromycin and prednisone -Order home nebulizer machine. -Follow-up with pulmonology on 12/16/2023. -if any worsening symptoms seek emergency care  Cough Persistent despite Tessalon Perles and Promethazine DM syrup. -Continue Tessalon Perles and Promethazine DM syrup as needed. -Consider further evaluation if cough persists after pulmonology consultation.        Follow up plan: Return if symptoms worsen or fail to improve.

## 2023-12-09 NOTE — Telephone Encounter (Signed)
Copied from CRM [id number]. Topic: General - Other >> Dec 08, 2023  4:44 PM Ja-Kwan M wrote: Reason for CRM: Pt stated that she went to [facility] and she was given 3 breathing treeatments. Pt stated she has an appt with Pulmonary but she could not get the appt moved up. Pt requests a Rx for nebulizer machine. Cb# [phone]

## 2023-12-16 ENCOUNTER — Encounter: Payer: Self-pay | Admitting: Pulmonary Disease

## 2023-12-16 ENCOUNTER — Ambulatory Visit: Payer: Medicare HMO | Admitting: Pulmonary Disease

## 2023-12-16 VITALS — BP 146/76 | HR 88 | Temp 97.6°F | Ht 64.0 in | Wt 172.0 lb

## 2023-12-16 DIAGNOSIS — R0602 Shortness of breath: Secondary | ICD-10-CM

## 2023-12-16 LAB — NITRIC OXIDE: Nitric Oxide: 163

## 2023-12-16 MED ORDER — FLUTICASONE-SALMETEROL 500-50 MCG/ACT IN AEPB: 1.0000 | INHALATION_SPRAY | Freq: Two times a day (BID) | RESPIRATORY_TRACT | 3 refills | Status: DC

## 2023-12-17 NOTE — Progress Notes (Signed)
Synopsis: Referred in by Alba Cory, MD   Subjective:   PATIENT ID: Brandy Johnston: female DOB: 1961-12-25, MRN: [id number]  Chief Complaint  Patient presents with   Consult    Cough, shortness of breath and wheezing.     HPI Brandy Johnston is a pleasant 62 year old female patient with a past medical history of diabetes mellitus type 2, hyperlipidemia, neuropathic pain presenting today to the pulmonary clinic after an ED visit for shortness of breath chest tightness and wheezing.  She reports that after coming back from a cruise to [place] a month ago she started experiencing productive cough with green sputum.  Associated with some intermittent wheezing and chest tightness.  She was treated for acute bronchitis/asthma exacerbation in setting of community-acquired pneumonia and was started on Advair 100/50.  She improved however she is still experiencing ongoing cough.  She does report hypersensitivity to strong scents.  However she does not carry the diagnosis of asthma and has never been hospitalized for an asthma exacerbation. She did improve however still with persistent cough.   Family history -positive family history for asthma.  Mother with lung cancer.  Social history -never smoker denies vaping drinks alcohol only occasionally.   ROS All systems were reviewed and are negative except for the above.  Objective:   Vitals:   12/16/23 1525 12/16/23 1527  BP: (!) 146/80 (!) 146/76  Pulse: 88   Temp: 97.6 F (36.4 C)   TempSrc: Temporal   SpO2: 96%   Weight: 172 lb (78 kg)   Height: 5\' 4"  (1.626 m)    96% on RA BMI Readings from Last 3 Encounters:  12/16/23 29.52 kg/m  12/09/23 29.21 kg/m  11/18/23 28.97 kg/m   Wt Readings from Last 3 Encounters:  12/16/23 172 lb (78 kg)  12/09/23 170 lb 3.2 oz (77.2 kg)  11/18/23 168 lb 12.8 oz (76.6 kg)    Physical Exam GEN: NAD, Healthy Appearing HEENT: Supple Neck, Reactive Pupils, EOMI  CVS: Normal S1, Normal  S2, RRR, Systolic murmur heard best at the right sternal border.   Lungs: Faint diffuse crackles.   Abdomen: Soft, non tender, non distended, + BS  Extremities: Warm and well perfused, No edema  Skin: No suspicious lesions appreciated  Psych: Normal Affect  Labs and imaging reviewed.  Ancillary Information   CBC    Component Value Date/Time   WBC 10.8 11/18/2023 1009   RBC 4.91 11/18/2023 1009   HGB 13.2 11/18/2023 1009   HGB 11.9 09/03/2016 1629   HCT 41.1 11/18/2023 1009   HCT 36.3 09/03/2016 1629   PLT 241 11/18/2023 1009   PLT 166 07/08/2012 0414   MCV 83.7 11/18/2023 1009   MCV 81 09/03/2016 1629   MCV 83 07/08/2012 0414   MCH 26.9 (L) 11/18/2023 1009   MCHC 32.1 11/18/2023 1009   RDW 15.2 (H) 11/18/2023 1009   RDW 14.9 09/03/2016 1629   RDW 14.4 07/08/2012 0414   LYMPHSABS 2,928 10/05/2023 1153   LYMPHSABS 4.0 (H) 09/03/2016 1629   LYMPHSABS 3.0 07/08/2012 0414   MONOABS 0.8 09/09/2023 1029   MONOABS 0.5 07/08/2012 0414   EOSABS 583 (H) 11/18/2023 1009   EOSABS 0.1 09/03/2016 1629   EOSABS 0.3 07/08/2012 0414   BASOSABS 86 11/18/2023 1009   BASOSABS 0.1 09/03/2016 1629   BASOSABS 0.1 07/08/2012 0414    Imaging  CT chest 2024 - No parenchymal lung disease, no endobronchial lesions.      No data to display  Assessment & Plan:  Brandy Johnston is a pleasant 62 year old female patient with a past medical history of diabetes mellitus type 2, hyperlipidemia, neuropathic pain presenting today to the pulmonary clinic after an ED visit for shortness of breath chest tightness and wheezing.  #Severe persistent asthma (CVA)  #Acute bronchitis post viral pneumonia  FENO 163  []  Increase Fluticasone-Salmeterol [Advair Disk] 500-50 1puff bid.  []  Albuterol 2puffs Q6H as needed  []  PFTs   #Systolic murmur heard best at the right sternal border  []  Echocardiogram.   Return in about 3 months (around 03/15/2024).  I spent 60 minutes caring for this patient  today, including preparing to see the patient, obtaining a medical history , reviewing a separately obtained history, performing a medically appropriate examination and/or evaluation, counseling and educating the patient/family/caregiver, ordering medications, tests, or procedures, documenting clinical information in the electronic health record, and independently interpreting results (not separately reported/billed) and communicating results to the patient/family/caregiver  Janann Colonel, MD [facility] 12/17/2023 2:55 PM

## 2023-12-20 ENCOUNTER — Encounter: Payer: Self-pay | Admitting: Family Medicine

## 2023-12-20 NOTE — Telephone Encounter (Signed)
 Care team updated and letter sent for eye exam notes.

## 2023-12-24 ENCOUNTER — Ambulatory Visit: Payer: Self-pay | Admitting: *Deleted

## 2023-12-24 DIAGNOSIS — R1310 Dysphagia, unspecified: Secondary | ICD-10-CM | POA: Diagnosis not present

## 2023-12-24 DIAGNOSIS — R062 Wheezing: Secondary | ICD-10-CM | POA: Diagnosis not present

## 2023-12-24 DIAGNOSIS — D721 Eosinophilia, unspecified: Secondary | ICD-10-CM | POA: Diagnosis not present

## 2023-12-24 DIAGNOSIS — D7219 Other eosinophilia: Secondary | ICD-10-CM | POA: Diagnosis not present

## 2023-12-24 DIAGNOSIS — I422 Other hypertrophic cardiomyopathy: Secondary | ICD-10-CM | POA: Diagnosis not present

## 2023-12-24 DIAGNOSIS — E1142 Type 2 diabetes mellitus with diabetic polyneuropathy: Secondary | ICD-10-CM | POA: Diagnosis not present

## 2023-12-24 DIAGNOSIS — Z9981 Dependence on supplemental oxygen: Secondary | ICD-10-CM | POA: Diagnosis not present

## 2023-12-24 DIAGNOSIS — R0602 Shortness of breath: Secondary | ICD-10-CM | POA: Diagnosis not present

## 2023-12-24 DIAGNOSIS — K30 Functional dyspepsia: Secondary | ICD-10-CM | POA: Diagnosis not present

## 2023-12-24 DIAGNOSIS — I421 Obstructive hypertrophic cardiomyopathy: Secondary | ICD-10-CM | POA: Diagnosis not present

## 2023-12-24 DIAGNOSIS — N1831 Chronic kidney disease, stage 3a: Secondary | ICD-10-CM | POA: Diagnosis not present

## 2023-12-24 DIAGNOSIS — K5900 Constipation, unspecified: Secondary | ICD-10-CM | POA: Diagnosis not present

## 2023-12-24 DIAGNOSIS — E785 Hyperlipidemia, unspecified: Secondary | ICD-10-CM | POA: Diagnosis not present

## 2023-12-24 DIAGNOSIS — J9601 Acute respiratory failure with hypoxia: Secondary | ICD-10-CM | POA: Diagnosis not present

## 2023-12-24 DIAGNOSIS — J45901 Unspecified asthma with (acute) exacerbation: Secondary | ICD-10-CM | POA: Diagnosis not present

## 2023-12-24 DIAGNOSIS — I129 Hypertensive chronic kidney disease with stage 1 through stage 4 chronic kidney disease, or unspecified chronic kidney disease: Secondary | ICD-10-CM | POA: Diagnosis not present

## 2023-12-24 DIAGNOSIS — Z7901 Long term (current) use of anticoagulants: Secondary | ICD-10-CM | POA: Diagnosis not present

## 2023-12-24 DIAGNOSIS — Z79899 Other long term (current) drug therapy: Secondary | ICD-10-CM | POA: Diagnosis not present

## 2023-12-24 DIAGNOSIS — R918 Other nonspecific abnormal finding of lung field: Secondary | ICD-10-CM | POA: Diagnosis not present

## 2023-12-24 DIAGNOSIS — R0789 Other chest pain: Secondary | ICD-10-CM | POA: Diagnosis not present

## 2023-12-24 DIAGNOSIS — R09A2 Foreign body sensation, throat: Secondary | ICD-10-CM | POA: Diagnosis not present

## 2023-12-24 DIAGNOSIS — N189 Chronic kidney disease, unspecified: Secondary | ICD-10-CM | POA: Diagnosis not present

## 2023-12-24 DIAGNOSIS — R1314 Dysphagia, pharyngoesophageal phase: Secondary | ICD-10-CM | POA: Diagnosis not present

## 2023-12-24 DIAGNOSIS — K219 Gastro-esophageal reflux disease without esophagitis: Secondary | ICD-10-CM | POA: Diagnosis not present

## 2023-12-24 DIAGNOSIS — R1110 Vomiting, unspecified: Secondary | ICD-10-CM | POA: Diagnosis not present

## 2023-12-24 DIAGNOSIS — R4182 Altered mental status, unspecified: Secondary | ICD-10-CM | POA: Diagnosis not present

## 2023-12-24 DIAGNOSIS — Z7951 Long term (current) use of inhaled steroids: Secondary | ICD-10-CM | POA: Diagnosis not present

## 2023-12-24 DIAGNOSIS — J4551 Severe persistent asthma with (acute) exacerbation: Secondary | ICD-10-CM | POA: Diagnosis not present

## 2023-12-24 DIAGNOSIS — J9811 Atelectasis: Secondary | ICD-10-CM | POA: Diagnosis not present

## 2023-12-24 DIAGNOSIS — R0600 Dyspnea, unspecified: Secondary | ICD-10-CM | POA: Diagnosis not present

## 2023-12-24 DIAGNOSIS — R002 Palpitations: Secondary | ICD-10-CM | POA: Diagnosis not present

## 2023-12-24 DIAGNOSIS — R058 Other specified cough: Secondary | ICD-10-CM | POA: Diagnosis not present

## 2023-12-24 DIAGNOSIS — E1122 Type 2 diabetes mellitus with diabetic chronic kidney disease: Secondary | ICD-10-CM | POA: Diagnosis not present

## 2023-12-24 NOTE — Telephone Encounter (Signed)
  Chief Complaint: wheezing audible , chest tightness Symptoms: see above. Coughing up light green sputum. Has attempted nebulizer 2 hours ago and still wheezing. Last OV 12/09/23 completed medication and worsening sx now  Frequency: na Pertinent Negatives: Patient denies difficulty breathing , no fever reported.  Disposition: [x] ED /[] Urgent Care (no appt availability in office) / [] Appointment(In office/virtual)/ []  [facility]/ [] Home Care/ [] Refused Recommended Disposition /[] [facility]/ []  Follow-up with PCP Additional Notes:   Recommended ED now. Patient last seen by provider 12/09/23.  Patient would like PCP to be aware she just saw pulmonologist and she has asthma and a heart murmur dx.      Reason for Disposition  Wheezing can be heard across the room  Answer Assessment - Initial Assessment Questions 1. RESPIRATORY STATUS: "Describe your breathing?" (e.g., wheezing, shortness of breath, unable to speak, severe coughing)      Audible wheezing  2. ONSET: "When did this breathing problem begin?"      2 hours ago  3. PATTERN "Does the difficult breathing come and go, or has it been constant since it started?"      Comes and goes but present now  4. SEVERITY: "How bad is your breathing?" (e.g., mild, moderate, severe)    - MILD: No SOB at rest, mild SOB with walking, speaks normally in sentences, can lie down, no retractions, pulse < 100.    - MODERATE: SOB at rest, SOB with minimal exertion and prefers to sit, cannot lie down flat, speaks in phrases, mild retractions, audible wheezing, pulse 100-120.    - SEVERE: Very SOB at rest, speaks in single words, struggling to breathe, sitting hunched forward, retractions, pulse > 120      Audible wheezing , can speak in sentences , having chest tightness 5. RECURRENT SYMPTOM: "Have you had difficulty breathing before?" If Yes, ask: "When was the last time?" and "What happened that time?"      Yes  6. CARDIAC HISTORY:  "Do you have any history of heart disease?" (e.g., heart attack, angina, bypass surgery, angioplasty)      Yes  7. LUNG HISTORY: "Do you have any history of lung disease?"  (e.g., pulmonary embolus, asthma, emphysema)     Yes asthma  8. CAUSE: "What do you think is causing the breathing problem?"      Recent sickness  9. OTHER SYMPTOMS: "Do you have any other symptoms? (e.g., dizziness, runny nose, cough, chest pain, fever)     Chest tightness, cough light green sputum. Wheezing  10. O2 SATURATION MONITOR:  "Do you use an oxygen saturation monitor (pulse oximeter) at home?" If Yes, ask: "What is your reading (oxygen level) today?" "What is your usual oxygen saturation reading?" (e.g., 95%)       Na  11. PREGNANCY: "Is there any chance you are pregnant?" "When was your last menstrual period?"       Na  12. TRAVEL: "Have you traveled out of the country in the last month?" (e.g., travel history, exposures)       Na  Protocols used: Breathing Difficulty-A-AH

## 2023-12-24 NOTE — Telephone Encounter (Signed)
FYI

## 2023-12-25 DIAGNOSIS — Z79899 Other long term (current) drug therapy: Secondary | ICD-10-CM | POA: Diagnosis not present

## 2023-12-25 DIAGNOSIS — R1310 Dysphagia, unspecified: Secondary | ICD-10-CM | POA: Diagnosis not present

## 2023-12-25 DIAGNOSIS — R0600 Dyspnea, unspecified: Secondary | ICD-10-CM | POA: Diagnosis not present

## 2023-12-25 DIAGNOSIS — E1122 Type 2 diabetes mellitus with diabetic chronic kidney disease: Secondary | ICD-10-CM | POA: Diagnosis not present

## 2023-12-25 DIAGNOSIS — R0602 Shortness of breath: Secondary | ICD-10-CM | POA: Diagnosis not present

## 2023-12-25 DIAGNOSIS — J45901 Unspecified asthma with (acute) exacerbation: Secondary | ICD-10-CM | POA: Diagnosis not present

## 2023-12-25 DIAGNOSIS — I129 Hypertensive chronic kidney disease with stage 1 through stage 4 chronic kidney disease, or unspecified chronic kidney disease: Secondary | ICD-10-CM | POA: Diagnosis not present

## 2023-12-25 DIAGNOSIS — N189 Chronic kidney disease, unspecified: Secondary | ICD-10-CM | POA: Diagnosis not present

## 2023-12-25 DIAGNOSIS — D7219 Other eosinophilia: Secondary | ICD-10-CM | POA: Diagnosis not present

## 2023-12-25 DIAGNOSIS — K219 Gastro-esophageal reflux disease without esophagitis: Secondary | ICD-10-CM | POA: Diagnosis not present

## 2023-12-25 DIAGNOSIS — R918 Other nonspecific abnormal finding of lung field: Secondary | ICD-10-CM | POA: Diagnosis not present

## 2023-12-25 DIAGNOSIS — Z7951 Long term (current) use of inhaled steroids: Secondary | ICD-10-CM | POA: Diagnosis not present

## 2023-12-26 DIAGNOSIS — J45901 Unspecified asthma with (acute) exacerbation: Secondary | ICD-10-CM | POA: Diagnosis not present

## 2023-12-26 DIAGNOSIS — R062 Wheezing: Secondary | ICD-10-CM | POA: Diagnosis not present

## 2023-12-26 DIAGNOSIS — Z79899 Other long term (current) drug therapy: Secondary | ICD-10-CM | POA: Diagnosis not present

## 2023-12-26 DIAGNOSIS — K219 Gastro-esophageal reflux disease without esophagitis: Secondary | ICD-10-CM | POA: Diagnosis not present

## 2023-12-26 DIAGNOSIS — Z7951 Long term (current) use of inhaled steroids: Secondary | ICD-10-CM | POA: Diagnosis not present

## 2023-12-26 DIAGNOSIS — R1310 Dysphagia, unspecified: Secondary | ICD-10-CM | POA: Diagnosis not present

## 2023-12-26 DIAGNOSIS — Z7901 Long term (current) use of anticoagulants: Secondary | ICD-10-CM | POA: Diagnosis not present

## 2023-12-26 DIAGNOSIS — E785 Hyperlipidemia, unspecified: Secondary | ICD-10-CM | POA: Diagnosis not present

## 2023-12-26 DIAGNOSIS — R0602 Shortness of breath: Secondary | ICD-10-CM | POA: Diagnosis not present

## 2023-12-27 DIAGNOSIS — J9601 Acute respiratory failure with hypoxia: Secondary | ICD-10-CM | POA: Diagnosis not present

## 2023-12-27 DIAGNOSIS — Z9981 Dependence on supplemental oxygen: Secondary | ICD-10-CM | POA: Diagnosis not present

## 2023-12-27 DIAGNOSIS — R09A2 Foreign body sensation, throat: Secondary | ICD-10-CM | POA: Diagnosis not present

## 2023-12-27 DIAGNOSIS — Z79899 Other long term (current) drug therapy: Secondary | ICD-10-CM | POA: Diagnosis not present

## 2023-12-27 DIAGNOSIS — K219 Gastro-esophageal reflux disease without esophagitis: Secondary | ICD-10-CM | POA: Diagnosis not present

## 2023-12-27 DIAGNOSIS — Z7901 Long term (current) use of anticoagulants: Secondary | ICD-10-CM | POA: Diagnosis not present

## 2023-12-27 DIAGNOSIS — R1310 Dysphagia, unspecified: Secondary | ICD-10-CM | POA: Diagnosis not present

## 2023-12-27 DIAGNOSIS — R1110 Vomiting, unspecified: Secondary | ICD-10-CM | POA: Diagnosis not present

## 2023-12-27 DIAGNOSIS — Z7951 Long term (current) use of inhaled steroids: Secondary | ICD-10-CM | POA: Diagnosis not present

## 2023-12-27 DIAGNOSIS — J45901 Unspecified asthma with (acute) exacerbation: Secondary | ICD-10-CM | POA: Diagnosis not present

## 2023-12-28 DIAGNOSIS — I421 Obstructive hypertrophic cardiomyopathy: Secondary | ICD-10-CM | POA: Diagnosis not present

## 2023-12-28 DIAGNOSIS — Z7951 Long term (current) use of inhaled steroids: Secondary | ICD-10-CM | POA: Diagnosis not present

## 2023-12-28 DIAGNOSIS — J9811 Atelectasis: Secondary | ICD-10-CM | POA: Diagnosis not present

## 2023-12-28 DIAGNOSIS — Z79899 Other long term (current) drug therapy: Secondary | ICD-10-CM | POA: Diagnosis not present

## 2023-12-28 DIAGNOSIS — J9601 Acute respiratory failure with hypoxia: Secondary | ICD-10-CM | POA: Diagnosis not present

## 2023-12-28 DIAGNOSIS — R4182 Altered mental status, unspecified: Secondary | ICD-10-CM | POA: Diagnosis not present

## 2023-12-28 DIAGNOSIS — K219 Gastro-esophageal reflux disease without esophagitis: Secondary | ICD-10-CM | POA: Diagnosis not present

## 2023-12-28 DIAGNOSIS — R1310 Dysphagia, unspecified: Secondary | ICD-10-CM | POA: Diagnosis not present

## 2023-12-30 DIAGNOSIS — K30 Functional dyspepsia: Secondary | ICD-10-CM | POA: Diagnosis not present

## 2023-12-30 DIAGNOSIS — J4551 Severe persistent asthma with (acute) exacerbation: Secondary | ICD-10-CM | POA: Diagnosis not present

## 2023-12-30 DIAGNOSIS — K219 Gastro-esophageal reflux disease without esophagitis: Secondary | ICD-10-CM | POA: Diagnosis not present

## 2023-12-30 DIAGNOSIS — R1310 Dysphagia, unspecified: Secondary | ICD-10-CM | POA: Diagnosis not present

## 2023-12-31 DIAGNOSIS — J4551 Severe persistent asthma with (acute) exacerbation: Secondary | ICD-10-CM | POA: Diagnosis not present

## 2024-01-03 ENCOUNTER — Telehealth: Payer: Self-pay | Admitting: Family Medicine

## 2024-01-03 DIAGNOSIS — E1142 Type 2 diabetes mellitus with diabetic polyneuropathy: Secondary | ICD-10-CM | POA: Diagnosis not present

## 2024-01-03 DIAGNOSIS — E1165 Type 2 diabetes mellitus with hyperglycemia: Secondary | ICD-10-CM | POA: Diagnosis not present

## 2024-01-03 DIAGNOSIS — I422 Other hypertrophic cardiomyopathy: Secondary | ICD-10-CM | POA: Diagnosis not present

## 2024-01-03 DIAGNOSIS — K219 Gastro-esophageal reflux disease without esophagitis: Secondary | ICD-10-CM | POA: Diagnosis not present

## 2024-01-03 DIAGNOSIS — N183 Chronic kidney disease, stage 3 unspecified: Secondary | ICD-10-CM | POA: Diagnosis not present

## 2024-01-03 DIAGNOSIS — J9601 Acute respiratory failure with hypoxia: Secondary | ICD-10-CM | POA: Diagnosis not present

## 2024-01-03 DIAGNOSIS — E1122 Type 2 diabetes mellitus with diabetic chronic kidney disease: Secondary | ICD-10-CM | POA: Diagnosis not present

## 2024-01-03 DIAGNOSIS — I10 Essential (primary) hypertension: Secondary | ICD-10-CM | POA: Diagnosis not present

## 2024-01-03 DIAGNOSIS — J4551 Severe persistent asthma with (acute) exacerbation: Secondary | ICD-10-CM | POA: Diagnosis not present

## 2024-01-03 NOTE — Telephone Encounter (Signed)
 Copied from CRM [id number]. Topic: General - Other >> Jan 03, 2024  3:16 PM Yvone Marda Blow wrote: Home Health Verbal Orders - Caller/Agency: Soni with [facility] Number: [phone] Service Requested: Physical Therapy Frequency: 1 w7   Any new concerns about the patient? Yes    requesting orders for a rollator walker

## 2024-01-04 ENCOUNTER — Telehealth: Payer: Self-pay | Admitting: Family Medicine

## 2024-01-04 DIAGNOSIS — I422 Other hypertrophic cardiomyopathy: Secondary | ICD-10-CM | POA: Diagnosis not present

## 2024-01-04 DIAGNOSIS — E1122 Type 2 diabetes mellitus with diabetic chronic kidney disease: Secondary | ICD-10-CM | POA: Diagnosis not present

## 2024-01-04 DIAGNOSIS — N183 Chronic kidney disease, stage 3 unspecified: Secondary | ICD-10-CM | POA: Diagnosis not present

## 2024-01-04 DIAGNOSIS — E1142 Type 2 diabetes mellitus with diabetic polyneuropathy: Secondary | ICD-10-CM | POA: Diagnosis not present

## 2024-01-04 DIAGNOSIS — J9601 Acute respiratory failure with hypoxia: Secondary | ICD-10-CM | POA: Diagnosis not present

## 2024-01-04 DIAGNOSIS — J4551 Severe persistent asthma with (acute) exacerbation: Secondary | ICD-10-CM | POA: Diagnosis not present

## 2024-01-04 DIAGNOSIS — K219 Gastro-esophageal reflux disease without esophagitis: Secondary | ICD-10-CM | POA: Diagnosis not present

## 2024-01-04 DIAGNOSIS — E1165 Type 2 diabetes mellitus with hyperglycemia: Secondary | ICD-10-CM | POA: Diagnosis not present

## 2024-01-04 DIAGNOSIS — I10 Essential (primary) hypertension: Secondary | ICD-10-CM | POA: Diagnosis not present

## 2024-01-04 NOTE — Telephone Encounter (Signed)
Verbals given  

## 2024-01-04 NOTE — Telephone Encounter (Signed)
 given

## 2024-01-04 NOTE — Telephone Encounter (Signed)
 Home Health Verbal Orders - Caller/Agency: Samul Dada [facility] Callback Number: [phone] Service Requested: Occupational Therapy Frequency: 1w6 Any new concerns about the patient? No

## 2024-01-05 DIAGNOSIS — J45901 Unspecified asthma with (acute) exacerbation: Secondary | ICD-10-CM | POA: Diagnosis not present

## 2024-01-06 ENCOUNTER — Other Ambulatory Visit: Payer: Self-pay | Admitting: Family Medicine

## 2024-01-06 DIAGNOSIS — E1142 Type 2 diabetes mellitus with diabetic polyneuropathy: Secondary | ICD-10-CM

## 2024-01-07 ENCOUNTER — Telehealth: Payer: Self-pay | Admitting: Pulmonary Disease

## 2024-01-07 NOTE — Telephone Encounter (Signed)
 Copied from CRM [id number]. Topic: General - Inquiry >> Jan 07, 2024 10:15 AM Leonette SQUIBB wrote: Reason for CRM: Merlynn with [facility].  They are requesting an order for a rolator .  She said they have faxed over two orders.  She also stated that the PT says pt will benefit from using one.  CB@  [phone] x [phone]

## 2024-01-07 NOTE — Telephone Encounter (Signed)
 Echo order has been CXL per Dr. Larinda Buttery

## 2024-01-07 NOTE — Telephone Encounter (Signed)
 You saw Brandy Johnston on 12/16/23 and placed order for her to schedule Echo. She was in hospital at [facility] 12/27/2023 and had echo there. Do we still need to schedule echo for you? Please review and let me know

## 2024-01-08 DIAGNOSIS — N189 Chronic kidney disease, unspecified: Secondary | ICD-10-CM | POA: Diagnosis not present

## 2024-01-08 DIAGNOSIS — G4730 Sleep apnea, unspecified: Secondary | ICD-10-CM | POA: Diagnosis not present

## 2024-01-08 DIAGNOSIS — Z7985 Long-term (current) use of injectable non-insulin antidiabetic drugs: Secondary | ICD-10-CM | POA: Diagnosis not present

## 2024-01-08 DIAGNOSIS — N183 Chronic kidney disease, stage 3 unspecified: Secondary | ICD-10-CM | POA: Diagnosis not present

## 2024-01-08 DIAGNOSIS — E1142 Type 2 diabetes mellitus with diabetic polyneuropathy: Secondary | ICD-10-CM | POA: Diagnosis not present

## 2024-01-08 DIAGNOSIS — I129 Hypertensive chronic kidney disease with stage 1 through stage 4 chronic kidney disease, or unspecified chronic kidney disease: Secondary | ICD-10-CM | POA: Diagnosis not present

## 2024-01-08 DIAGNOSIS — B370 Candidal stomatitis: Secondary | ICD-10-CM | POA: Diagnosis not present

## 2024-01-08 DIAGNOSIS — E1122 Type 2 diabetes mellitus with diabetic chronic kidney disease: Secondary | ICD-10-CM | POA: Diagnosis not present

## 2024-01-08 DIAGNOSIS — R059 Cough, unspecified: Secondary | ICD-10-CM | POA: Diagnosis not present

## 2024-01-08 DIAGNOSIS — Z20822 Contact with and (suspected) exposure to covid-19: Secondary | ICD-10-CM | POA: Diagnosis not present

## 2024-01-08 DIAGNOSIS — E119 Type 2 diabetes mellitus without complications: Secondary | ICD-10-CM | POA: Diagnosis not present

## 2024-01-10 ENCOUNTER — Telehealth: Payer: Self-pay

## 2024-01-10 DIAGNOSIS — J4551 Severe persistent asthma with (acute) exacerbation: Secondary | ICD-10-CM | POA: Diagnosis not present

## 2024-01-10 NOTE — Transitions of Care (Post Inpatient/ED Visit) (Signed)
   01/10/2024  Name: LATRIA MCCARRON MRN: [id number] DOB: 1961-07-16  Today's TOC FU Call Status: Today's TOC FU Call Status:: Unsuccessful Call (1st Attempt) Unsuccessful Call (1st Attempt) Date: 01/10/24  Attempted to reach the patient regarding the most recent Inpatient/ED visit.  Follow Up Plan: Additional outreach attempts will be made to reach the patient to complete the Transitions of Care (Post Inpatient/ED visit) call.   Signature Julian Lemmings, LPN [facility] Nurse Health Advisor Direct Dial [phone]

## 2024-01-11 DIAGNOSIS — J9601 Acute respiratory failure with hypoxia: Secondary | ICD-10-CM | POA: Diagnosis not present

## 2024-01-11 DIAGNOSIS — J4551 Severe persistent asthma with (acute) exacerbation: Secondary | ICD-10-CM | POA: Diagnosis not present

## 2024-01-11 DIAGNOSIS — K219 Gastro-esophageal reflux disease without esophagitis: Secondary | ICD-10-CM | POA: Diagnosis not present

## 2024-01-11 DIAGNOSIS — N183 Chronic kidney disease, stage 3 unspecified: Secondary | ICD-10-CM | POA: Diagnosis not present

## 2024-01-11 DIAGNOSIS — E1122 Type 2 diabetes mellitus with diabetic chronic kidney disease: Secondary | ICD-10-CM | POA: Diagnosis not present

## 2024-01-11 DIAGNOSIS — E1142 Type 2 diabetes mellitus with diabetic polyneuropathy: Secondary | ICD-10-CM | POA: Diagnosis not present

## 2024-01-11 DIAGNOSIS — I10 Essential (primary) hypertension: Secondary | ICD-10-CM | POA: Diagnosis not present

## 2024-01-11 DIAGNOSIS — E1165 Type 2 diabetes mellitus with hyperglycemia: Secondary | ICD-10-CM | POA: Diagnosis not present

## 2024-01-11 DIAGNOSIS — I422 Other hypertrophic cardiomyopathy: Secondary | ICD-10-CM | POA: Diagnosis not present

## 2024-01-11 NOTE — Transitions of Care (Post Inpatient/ED Visit) (Signed)
 01/11/2024  Name: Brandy Johnston MRN: [id number] DOB: [birth year]  Today's TOC FU Call Status: Today's TOC FU Call Status:: Successful TOC FU Call Completed Unsuccessful Call (1st Attempt) Date: 01/10/24 [facility] FU Call Complete Date: 01/11/24 Patient's Name and Date of Birth confirmed.  Transition Care Management Follow-up Telephone Call Date of Discharge: 01/08/24 Discharge Facility: Other [facility]) Name of Other (Non-[facility]) Discharge Facility: [facility] Type of Discharge: Emergency Department Reason for ED Visit: Other: (candidal stomatitis) How have you been since you were released from the hospital?: Same Any questions or concerns?: No  Items Reviewed: Did you receive and understand the discharge instructions provided?: Yes Medications obtained,verified, and reconciled?: Yes (Medications Reviewed) Any new allergies since your discharge?: No Dietary orders reviewed?: Yes Do you have support at home?: Yes People in Home: friend(s)  Medications Reviewed Today: Medications Reviewed Today     Reviewed by Emmitt Pan, LPN (Licensed Practical Nurse) on 01/11/24 at 1628  Med List Status: <None>   Medication Order Taking? Sig Documenting Provider Last Dose Status Informant  albuterol  (VENTOLIN  HFA) 108 (90 Base) MCG/ACT inhaler [id number] No SMARTSIG:2 Puff(s) By Mouth Every 4-6 Hours PRN [provider] Taking Active   aspirin  (ASPIRIN  81) 81 MG chewable tablet [id number] No Chew 1 tablet (81 mg total) by mouth daily. Sowles, Krichna, MD Taking Active Self  azithromycin  (ZITHROMAX ) 250 MG tablet [id number] No Take by mouth. [provider] Taking Active   baclofen  (LIORESAL ) 10 MG tablet [id number] No Take 1 tablet (10 mg total) by mouth 3 (three) times daily. Sowles, Krichna, MD Taking Active   Blood Glucose Monitoring Suppl (CONTOUR NEXT ONE) KIT [id number] No USE TO TEST BLOOD SUGAR ONCE D [provider] Taking Active Self   Butalbital -APAP-Caffeine  50-300-40 MG CAPS [id number] No Take 1 capsule by mouth every 4 (four) hours as needed. Sowles, Krichna, MD Taking Active   Cholecalciferol (VITAMIN D ) 2000 units CAPS [id number] No Take 1 capsule (2,000 Units total) by mouth daily. Sowles, Krichna, MD Taking Active Self  diclofenac  Sodium (VOLTAREN ) 1 % GEL [id number] No Apply 2 g topically 4 (four) times daily. Sowles, Krichna, MD Taking Active   diltiazem  (CARDIZEM  CD) 180 MG 24 hr capsule [id number] No Take 1 capsule (180 mg total) by mouth daily. Sowles, Krichna, MD Taking Active   ezetimibe  (ZETIA ) 10 MG tablet [id number] No Take 1 tablet (10 mg total) by mouth daily. Sowles, Krichna, MD Taking Active   famotidine  (PEPCID ) 20 MG tablet [id number] No Take 1 tablet (20 mg total) by mouth 2 (two) times daily. Sowles, Krichna, MD Taking Active   fluticasone -salmeterol (ADVAIR) 500-50 MCG/ACT AEPB [id number]  Inhale 1 puff into the lungs in the morning and at bedtime. Malka Domino, MD  Active   Fluticasone -Umeclidin-Vilant (TRELEGY ELLIPTA ) 100-62.5-25 MCG/ACT AEPB [id number] No Inhale 1 puff into the lungs daily. Gareth Mliss FALCON, FNP Taking Active   gabapentin  (NEURONTIN ) 300 MG capsule [id number] No Take 1 capsule (300 mg total) by mouth 3 (three) times daily. Sowles, Krichna, MD Taking Active   glucose blood test strip [id number] No Use as instructed Sowles, Krichna, MD Taking Active Self  ipratropium-albuterol  (DUONEB) 0.5-2.5 (3) MG/3ML SOLN [id number] No Take 3 mLs by nebulization every 6 (six) hours as needed. Gareth Mliss FALCON, FNP Taking Active     Discontinued 01/29/20 1715   loratadine  (CLARITIN ) 10 MG tablet [id number] No Take by mouth. [provider] Taking Active   magnesium  oxide (MAG-OX) 400 MG tablet [id number] No Take 1 tablet (400  mg total) by mouth 2 (two) times daily. Sowles, Krichna, MD Taking Active   meclizine  (ANTIVERT ) 25 MG tablet [id number] No Take 1 tablet (25 mg total) by mouth 3 (three) times  daily as needed for dizziness. Suzanne Kirsch, MD Taking Active   Microlet Lancets MISC [id number] No USE TO CHECK BLOOD SUGAR ONCE D [provider] Taking Active Self  ondansetron  (ZOFRAN -ODT) 4 MG disintegrating tablet [id number] No Take 1 tablet (4 mg total) by mouth every 8 (eight) hours as needed for nausea or vomiting. Suzanne Kirsch, MD Taking Active   promethazine -dextromethorphan (PROMETHAZINE -DM) 6.25-15 MG/5ML syrup [id number] No Take 5 mLs by mouth 4 (four) times daily as needed for cough. Gareth Mliss FALCON, FNP Taking Active   rosuvastatin  (CRESTOR ) 40 MG tablet [id number] No TAKE 1 TABLET(40 MG) BY MOUTH DAILY Sowles, Krichna, MD Taking Active   Semaglutide ,0.25 or 0.5MG /DOS, (OZEMPIC , 0.25 OR 0.5 MG/DOSE,) 2 MG/3ML SOPN [id number]  INJECT 0.5 MG UNDER THE SKIN ONCE A JANET Loron, Krichna, MD  Active   traZODone  (DESYREL ) 50 MG tablet [id number] No TAKE 1 TABLET(50 MG) BY MOUTH AT BEDTIME Sowles, Krichna, MD Taking Active   triamcinolone  cream (KENALOG ) 0.1 % [id number] No Apply 1 Application topically 2 (two) times daily. Sowles, Krichna, MD Taking Active             Home Care and Equipment/Supplies: Were Home Health Services Ordered?: NA Any new equipment or medical supplies ordered?: NA  Functional Questionnaire: Do you need assistance with bathing/showering or dressing?: Yes Do you need assistance with meal preparation?: Yes Do you need assistance with eating?: No Do you have difficulty maintaining continence: No Do you need assistance with getting out of bed/getting out of a chair/moving?: No Do you have difficulty managing or taking your medications?: No  Follow up appointments reviewed: PCP Follow-up appointment confirmed?: Yes Date of PCP follow-up appointment?: 01/19/24 Follow-up Provider: [facility] Follow-up appointment confirmed?: NA Do you need transportation to your follow-up appointment?: No Do you understand care options if your  condition(s) worsen?: Yes-patient verbalized understanding    SIGNATURE Julian Lemmings, LPN [facility] Nurse Health Advisor Direct Dial [phone]

## 2024-01-12 DIAGNOSIS — J45909 Unspecified asthma, uncomplicated: Secondary | ICD-10-CM | POA: Diagnosis not present

## 2024-01-13 ENCOUNTER — Ambulatory Visit: Payer: Self-pay

## 2024-01-13 NOTE — Telephone Encounter (Signed)
  Chief Complaint: Elevated blood sugars. Symptoms: See note Frequency: today Pertinent Negatives: Patient denies  Disposition: [] ED /[] Urgent Care (no appt availability in office) / [x] Appointment(In office/virtual)/ []  [facility] [facility]/ [] Home Care/ [] Refused Recommended Disposition /[] [place] [facility]/ []  Follow-up with PCP Additional Notes: Pt has been on steroids recently, and is now having elevated blood sugars. She is also having difficulty breathing, however not more than usual. Pt will continue to monitor glucose and will call back if they increase or s/s develop. Pt made appt for tomorrow morning.   Summary: blood sugar, high and hands are shaking   Pt called in ,Blood sugar is high and hands are shaking     Reason for Disposition  [1] Blood glucose 240 - 300 mg/dL (96.0 - 45.4 mmol/L) AND [2] does not  use insulin (e.g., not insulin-dependent; most people with type 2 diabetes)  Answer Assessment - Initial Assessment Questions 1. BLOOD GLUCOSE: "What is your blood glucose level?"      256 at a few minutes ago 2. ONSET: "When did you check the blood glucose?"     A few minutes ago 3. USUAL RANGE: "What is your glucose level usually?" (e.g., usual fasting morning value, usual evening value)     70-110 6. INSULIN: "Do you take insulin?" "What type of insulin(s) do you use? What is the mode of delivery? (syringe, pen; injection or pump)?"      no 7. DIABETES PILLS: "Do you take any pills for your diabetes?" If Yes, ask: "Have you missed taking any pills recently?"     no 8. OTHER SYMPTOMS: "Do you have any symptoms?" (e.g., fever, frequent urination, difficulty breathing, dizziness, weakness, vomiting)     no  Protocols used: Diabetes - High Blood Sugar-A-AH

## 2024-01-14 ENCOUNTER — Ambulatory Visit (INDEPENDENT_AMBULATORY_CARE_PROVIDER_SITE_OTHER): Payer: Medicare HMO | Admitting: Family Medicine

## 2024-01-14 ENCOUNTER — Encounter: Payer: Self-pay | Admitting: Family Medicine

## 2024-01-14 VITALS — BP 120/70 | HR 86 | Resp 16 | Ht 64.0 in | Wt 171.0 lb

## 2024-01-14 DIAGNOSIS — Z09 Encounter for follow-up examination after completed treatment for conditions other than malignant neoplasm: Secondary | ICD-10-CM

## 2024-01-14 DIAGNOSIS — J9601 Acute respiratory failure with hypoxia: Secondary | ICD-10-CM

## 2024-01-14 DIAGNOSIS — N183 Chronic kidney disease, stage 3 unspecified: Secondary | ICD-10-CM | POA: Diagnosis not present

## 2024-01-14 DIAGNOSIS — E1142 Type 2 diabetes mellitus with diabetic polyneuropathy: Secondary | ICD-10-CM | POA: Diagnosis not present

## 2024-01-14 DIAGNOSIS — R739 Hyperglycemia, unspecified: Secondary | ICD-10-CM | POA: Diagnosis not present

## 2024-01-14 DIAGNOSIS — K219 Gastro-esophageal reflux disease without esophagitis: Secondary | ICD-10-CM | POA: Diagnosis not present

## 2024-01-14 DIAGNOSIS — J4551 Severe persistent asthma with (acute) exacerbation: Secondary | ICD-10-CM | POA: Diagnosis not present

## 2024-01-14 DIAGNOSIS — I422 Other hypertrophic cardiomyopathy: Secondary | ICD-10-CM

## 2024-01-14 DIAGNOSIS — E1165 Type 2 diabetes mellitus with hyperglycemia: Secondary | ICD-10-CM | POA: Diagnosis not present

## 2024-01-14 DIAGNOSIS — J45901 Unspecified asthma with (acute) exacerbation: Secondary | ICD-10-CM | POA: Diagnosis not present

## 2024-01-14 DIAGNOSIS — I10 Essential (primary) hypertension: Secondary | ICD-10-CM | POA: Diagnosis not present

## 2024-01-14 DIAGNOSIS — E1122 Type 2 diabetes mellitus with diabetic chronic kidney disease: Secondary | ICD-10-CM | POA: Diagnosis not present

## 2024-01-14 LAB — GLUCOSE, POCT (MANUAL RESULT ENTRY): POC Glucose: 162 mg/dL — AB (ref 70–99)

## 2024-01-14 MED ORDER — FREESTYLE LIBRE 3 SENSOR MISC: 1.0000 | Status: DC

## 2024-01-14 NOTE — Progress Notes (Signed)
Patient ID: Brandy Johnston, female    DOB: 20-May-1961, 63 y.o.   MRN: [id number]  PCP: Alba Cory, MD  Chief Complaint  Patient presents with   Hyperglycemia    Subjective:   Brandy Johnston is a 63 y.o. female, presents to clinic with CC of the following:  HPI  Pt with hx of DM managed on ozempic, she was recently admitted to [facility] for acute hypoxemic respiratory failure asthma with acute exacerbation, hypertrophic cardiomyopathy, dysphagia, she was given antibiotics and steroids inpatient and then subsequently discharged on 12/31/2023 with a long steroid taper she has been working with ENT, pulmonology and cardiology -diagnosis of asthma is fairly new, she continues to have exertional dyspnea She is currently on the last couple days of her steroids She was feeling shaky and called the nurse line, was told to check her blood sugars it was high and was told to come in today for evaluation Her last A1c with her PCP here in clinic was in September and was well-controlled at 6.5, inpatient it was 6.3 Lab Results  Component Value Date   HGBA1C 6.5 (A) 09/08/2023  [facility] A1C was 6.3 12/24/2023 High sugars last night 249 -she states inpatient on steroids her blood sugars were higher and they were giving her insulin to manage it she was discharged home without insulin and just resuming her Ozempic This am in clinic without eating blood sugar is 162 Results for orders placed or performed in visit on 01/14/24  POCT Glucose (CBG)   Collection Time: 01/14/24  9:56 AM  Result Value Ref Range   POC Glucose 162 (A) 70 - 99 mg/dl   Has only a few days left of steroids on 5 mg, has been on steroids since Dec 28th, weaned outpt from 1/3 from 30 mg every 4 days down to 20, then 10, now 5   She has been on Ozempic 0.50 mg weekly for very long time  Inpt admission records and labs review  ECHO 12/30: Summary   1. The left ventricle is normal in size with mildly increased wall thickness  and  asymmetric hypertrophy of the ventricular septum, consistent with a  hypertrophic cardiomyopathy.    2. The left ventricular systolic function is hyperdynamic, LVEF is visually  estimated at >70%.   3. There is systolic anterior motion of the mitral valve and aortic  subvalvular turbulence, suggesting a dynamic outflow obstruction.    4. With Valsalva the peak left ventricular outflow gradient is 34 mmHg.    5. The right ventricle is normal in size, with normal systolic function.    6. There is no evidence of an interatrial flow communication or  intrapulmonary shunt by agitated saline study.   Cardiac MRI had motion artifact and has been ordered and scheduled for repeat outpatient CT chest high-resolution inpatient at [facility] on 12/25/2023: IMPRESSION:   --No interstitial lung disease.  --Diffuse bronchial thickening and mucoid impaction of numerous small airways in the lower lobes with associated air trapping. Findings may reflect obstructive small airway disease etiology could be aspiration or inflammatory bronchitis.   She is pending outpt PFTs with pulm after completing steroids  GERD meds changed inpt on protonix  Also consulting with ENT/GI for dysphagia   Discharge summary and hospital summary per [facility]/care everywhere:  Admit Date: 12/25/2023  Discharge Date: 01/01/2024   Discharge To: Home with Home Health and/or PT/OT  Discharge Service: HBR - HBC: Hospitalist Service #2   Discharge Attending Physician: Wynona Canes  Sima Matas, [facility]  Discharge Diagnoses: Principal Problem: Asthma with acute exacerbation (POA: Yes) Active Problems: Essential (primary) hypertension (POA: Yes) Gastro-esophageal reflux disease without esophagitis (POA: Yes) Stage 3 chronic kidney disease (CMS-HCC) (POA: Yes) Type 2 diabetes mellitus with stage 3 chronic kidney disease, without long-term current use of insulin (CMS-HCC) (POA: Yes) Hypertrophic cardiomyopathy (CMS-HCC) (POA: Yes) Dysphagia (POA:  Yes) Acute hypoxemic respiratory failure (CMS-HCC) (POA: Yes) Resolved Problems: * No resolved hospital problems. *  Outpatient Provider Follow Up Issues:  PFTs at next pulm visit - unable to be obtained inpatient ENT referral for cricopharyngeal bar at c6 Once EGD timing determined, will need to hold PPI x2 weeks  Hospital Course:   63 y.o. with past medical history of GERD, arthritis, diabetes, hyperlipidemia, peripheral neuropathy, sickle cell trait, and recent concern for asthma, who is admitted with exacerbation of wheezing and dyspnea in the setting of apparent viral illness.   Acute hypoxemic respiratory failure Exertional dypsnea Asthma with acute exacerbation Symptoms out of proportion and unusual for asthma given no longstanding asthma history, recurrent episodic symptoms, and persistent hypoxemia even after improvement in bronchospasm. Wheezing and SOB first ~48 hours of hospitalization was rather severe, at one point requiring continuous albuterol, IV magnesium, and CPAP overnight 10/28, and which was apparently responsive to bronchodilators. HRCT consistent with asthma and not eosinophilic pneumonia (noted to have persistent eosinophilia) or ILD. Pulmonology consulted. Suspect hypoxemia was due in part to atalectasis, and that some of her episodic symptoms are explained by dynamic outflow tract obstruction as below. She was provided scheduled bronchodilators that were gradually spaced. She started on prednisone initially 60mg  daily and gradually, weaned; at discharge she is on prednisone 30mg  daily and continue to wean by 10mg  every 4 days then 5mg  then off. PFTs unable to be obtained but will be done outpatient on 01/05/24, clinic follow up the following week. 6 min walk completed and no supplemental oxygen required. Outpatient sleep study coordinated.   Hypertrophic cardiomyopathy  Palpitations  mild diastolic dysfunction With dynamic mild outflow tract obstruction noted on echo,  with valsalva gradient 34 mmHg and chordal SAM (trivial MR). Likely to not tolerate tachycardia or hypovolemia well. 1. Continue patient diltiazem 180 mg daily.  2. Cardiac MRI with mild asymmetric septal hypertrophy up to 14mm, NO LVOT obstruction, ideally needs repeat with contrast - scheduled for 02/17/24 3. Zio patch for two weeks on discharge.  4. Started on metoprolol succinate 12.5mg  daily - tolerating well  Esophageal dysphagia Cricopharyngeal bar Describes fairly longstanding sensation of solid food getting stuck in her upper chest, in addition to early satiety and occasional regurgitation of food. Gastroenterology consulted, no evidence of EOE on previous scopes. Plan for outpatient EGD when off of steroids and off PPI x2 weeks. SLP consulted and recommended MBSS found to have no aspiration but a prominent cricopharyngeal bar at C6. Upper GI with mild delayed gastric emptying. Otherwise, unremarkable upper GI examination. Gastroenterology is arranging followup with esophageal specialist NP Spacek in clinic to whose expertise I defer whether ENT referral is warranted. Per NP Spacek,, should continue bid high dose PPI until visit 03/28/24.    Patient Active Problem List   Diagnosis Date Noted   Chronic left shoulder pain 11/09/2022   Hyperlipidemia 01/14/2021   Incomplete tear of left rotator cuff 07/03/2020   Angioedema due to angiotensin converting enzyme inhibitor (ACE-I) 01/29/2020   Chronic bilateral back pain 07/26/2017   Coronary artery calcification 05/17/2017   Heart palpitations 05/15/2017   History of  acute gastritis    Leukocytosis 09/30/2016   Atherosclerosis of abdominal aorta (HCC) 08/18/2016   Type 2 diabetes mellitus with stage 3 chronic kidney disease, without long-term current use of insulin (HCC) 12/16/2015   Diabetic neuropathy associated with type 2 diabetes mellitus (HCC) 09/18/2015   Insomnia 09/18/2015   Benign essential HTN 06/18/2015   Chronic kidney disease  (CKD), stage III (moderate) (HCC) 06/18/2015   Diabetes mellitus with renal manifestation (HCC) 06/18/2015   Obesity (BMI 30.0-34.9) 06/18/2015   Degenerative arthritis of hip 06/18/2015   Sickle cell trait (HCC) 06/18/2015   Dyslipidemia 05/27/2010   Gastro-esophageal reflux disease without esophagitis 05/25/2008      Current Outpatient Medications:    albuterol (VENTOLIN HFA) 108 (90 Base) MCG/ACT inhaler, SMARTSIG:2 Puff(s) By Mouth Every 4-6 Hours PRN, Disp: , Rfl:    aspirin (ASPIRIN 81) 81 MG chewable tablet, Chew 1 tablet (81 mg total) by mouth daily., Disp: 30 tablet, Rfl: 0   baclofen (LIORESAL) 10 MG tablet, Take 1 tablet (10 mg total) by mouth 3 (three) times daily., Disp: 90 each, Rfl: 0   Blood Glucose Monitoring Suppl (CONTOUR NEXT ONE) KIT, USE TO TEST BLOOD SUGAR ONCE D, Disp: , Rfl:    Butalbital-APAP-Caffeine 50-300-40 MG CAPS, Take 1 capsule by mouth every 4 (four) hours as needed., Disp: 40 capsule, Rfl: 0   Cholecalciferol (VITAMIN D) 2000 units CAPS, Take 1 capsule (2,000 Units total) by mouth daily., Disp: 30 capsule, Rfl: 0   diclofenac Sodium (VOLTAREN) 1 % GEL, Apply 2 g topically 4 (four) times daily., Disp: 100 g, Rfl: 2   diltiazem (CARDIZEM CD) 180 MG 24 hr capsule, Take 1 capsule (180 mg total) by mouth daily., Disp: 90 capsule, Rfl: 1   ezetimibe (ZETIA) 10 MG tablet, Take 1 tablet (10 mg total) by mouth daily., Disp: 90 tablet, Rfl: 3   famotidine (PEPCID) 20 MG tablet, Take 1 tablet (20 mg total) by mouth 2 (two) times daily., Disp: 180 tablet, Rfl: 1   fluticasone-salmeterol (ADVAIR) 500-50 MCG/ACT AEPB, Inhale 1 puff into the lungs in the morning and at bedtime., Disp: 60 each, Rfl: 3   Fluticasone-Umeclidin-Vilant (TRELEGY ELLIPTA) 100-62.5-25 MCG/ACT AEPB, Inhale 1 puff into the lungs daily., Disp: , Rfl:    gabapentin (NEURONTIN) 300 MG capsule, Take 1 capsule (300 mg total) by mouth 3 (three) times daily., Disp: 270 capsule, Rfl: 1   glucose blood test  strip, Use as instructed, Disp: 100 each, Rfl: 12   ipratropium-albuterol (DUONEB) 0.5-2.5 (3) MG/3ML SOLN, Take 3 mLs by nebulization every 6 (six) hours as needed., Disp: 360 mL, Rfl: 1   loratadine (CLARITIN) 10 MG tablet, Take by mouth., Disp: , Rfl:    magnesium oxide (MAG-OX) 400 MG tablet, Take 1 tablet (400 mg total) by mouth 2 (two) times daily., Disp: 180 tablet, Rfl: 1   meclizine (ANTIVERT) 25 MG tablet, Take 1 tablet (25 mg total) by mouth 3 (three) times daily as needed for dizziness., Disp: 30 tablet, Rfl: 0   Microlet Lancets MISC, USE TO CHECK BLOOD SUGAR ONCE D, Disp: , Rfl:    ondansetron (ZOFRAN-ODT) 4 MG disintegrating tablet, Take 1 tablet (4 mg total) by mouth every 8 (eight) hours as needed for nausea or vomiting., Disp: 30 tablet, Rfl: 0   rosuvastatin (CRESTOR) 40 MG tablet, TAKE 1 TABLET(40 MG) BY MOUTH DAILY, Disp: 90 tablet, Rfl: 3   Semaglutide,0.25 or 0.5MG /DOS, (OZEMPIC, 0.25 OR 0.5 MG/DOSE,) 2 MG/3ML SOPN, INJECT 0.5 MG UNDER THE  SKIN ONCE A WEEK, Disp: 2 mL, Rfl: 0   traZODone (DESYREL) 50 MG tablet, TAKE 1 TABLET(50 MG) BY MOUTH AT BEDTIME, Disp: 90 tablet, Rfl: 0   triamcinolone cream (KENALOG) 0.1 %, Apply 1 Application topically 2 (two) times daily., Disp: 453.6 g, Rfl: 0   azithromycin (ZITHROMAX) 250 MG tablet, Take by mouth. (Patient not taking: Reported on 01/14/2024), Disp: , Rfl:    promethazine-dextromethorphan (PROMETHAZINE-DM) 6.25-15 MG/5ML syrup, Take 5 mLs by mouth 4 (four) times daily as needed for cough. (Patient not taking: Reported on 01/14/2024), Disp: 118 mL, Rfl: 0   Allergies  Allergen Reactions   Ace Inhibitors Swelling    Angioedema    Lisinopril Swelling    Face and neck swelling     Social History   Tobacco Use   Smoking status: Never   Smokeless tobacco: Never  Vaping Use   Vaping status: Never Used  Substance Use Topics   Alcohol use: Yes    Alcohol/week: 0.0 standard drinks of alcohol    Comment: rare   Drug use: Yes     Types: Marijuana    Comment: smokes marijuana occasionally      Chart Review Today: I personally reviewed active problem list, medication list, allergies, family history, social history, health maintenance, notes from last encounter, lab results, imaging with the patient/caregiver today.   Review of Systems  Constitutional: Negative.   HENT: Negative.    Eyes: Negative.   Respiratory: Negative.    Cardiovascular: Negative.   Gastrointestinal: Negative.   Endocrine: Negative.   Genitourinary: Negative.   Musculoskeletal: Negative.   Skin: Negative.   Allergic/Immunologic: Negative.   Neurological: Negative.   Hematological: Negative.   Psychiatric/Behavioral: Negative.    All other systems reviewed and are negative.      Objective:   Vitals:   01/14/24 0953  BP: 120/70  Pulse: 86  Resp: 16  SpO2: 96%  Weight: 171 lb (77.6 kg)  Height: 5\' 4"  (1.626 m)    Body mass index is 29.35 kg/m.  Physical Exam Vitals and nursing note reviewed.  Constitutional:      General: She is not in acute distress.    Appearance: Normal appearance. She is well-developed. She is not ill-appearing, toxic-appearing or diaphoretic.  HENT:     Head: Normocephalic and atraumatic.     Nose: Nose normal.  Eyes:     General:        Right eye: No discharge.        Left eye: No discharge.     Conjunctiva/sclera: Conjunctivae normal.  Neck:     Trachea: No tracheal deviation.  Cardiovascular:     Rate and Rhythm: Normal rate and regular rhythm.     Pulses: Normal pulses.     Heart sounds: Normal heart sounds.  Pulmonary:     Effort: Pulmonary effort is normal. No respiratory distress.     Breath sounds: No stridor. Rales (bibasilar) present. No wheezing or rhonchi.  Skin:    General: Skin is warm and dry.     Findings: No rash.  Neurological:     Mental Status: She is alert.     Motor: No abnormal muscle tone.     Coordination: Coordination normal.  Psychiatric:        Behavior:  Behavior normal.      Results for orders placed or performed in visit on 01/14/24  POCT Glucose (CBG)   Collection Time: 01/14/24  9:56 AM  Result Value Ref Range  POC Glucose 162 (A) 70 - 99 mg/dl       Assessment & Plan:      ICD-10-CM   1. Hyperglycemia  R73.9 POCT Glucose (CBG)   high of ~250, still ons teroids, would expect improvement in the next 1-2 weeks, CGM given and reviewed with pt, she has f/up next week with PCP    2. Encounter for examination following treatment at hospital  Z09    extensive review of [facility] admission, records, results and added dx to her problem list today - briefly updated PCP as well    3. Exacerbation of asthma, unspecified asthma severity, unspecified whether persistent  J45.901    seeing pulm pending outpt PFTs, lungs with bibasilar crackles    4. Hypertrophic cardiomyopathy (HCC)  I42.2    following with [facility] cardiology outpt cardiac MRI with contrast pending, on metoprolol 12.5 mg    5. Acute hypoxemic respiratory failure (HCC)  J96.01    improved prior to discharge, still some sx, but not requiring oxygen     Recommend CGM to monitor blood sugars, they are expected to be higher right now due to steroids x 2 weeks Likely to improve after d/c of steroids in a few days Reviewed sx of hyperglycemia - she does not seem to be autodiuresing or losing weight At this time felt any meds like glipizide or insulin would be more likely to cause harm than benefit She was encouraged to continue on her current Ozempic dose and follow-up with her PCP she would not need a repeat A1c until March and her last 2 A1c's have been well-controlled 6.3 and 6.5    Danelle Berry, PA-C 01/14/24 10:14 AM

## 2024-01-14 NOTE — Patient Instructions (Signed)
Check your sugars in the morning every couple days, I expect them to improve over the next 1-2 weeks as you get completely done with your steroids.  Hyperglycemia Hyperglycemia is when the sugar (glucose) level in your blood is too high. High blood sugar can happen to people who have or do not have diabetes. High blood sugar can happen quickly. It can be an emergency. What are the causes? If you have diabetes, high blood sugar may be caused by: Medicines that increase blood sugar or affect your control of diabetes. Getting less physical activity. Overeating. Being sick or injured or having an infection. Having surgery. Stress. Not giving yourself enough insulin (if you are taking it). You may have high blood sugar because you have diabetes that has not been diagnosed yet. If you do not have diabetes, high blood sugar may be caused by: Certain medicines. Stress. A bad illness. An infection. Having surgery. Diseases of the pancreas. What increases the risk? This condition is more likely to develop in people who have risk factors for diabetes, such as: Having a family member with diabetes. Certain conditions in which the body's defense system (immune system) attacks itself. These are called autoimmune disorders. Being overweight. Not being active. Having a condition called insulin resistance. Having a history of: Prediabetes. Diabetes when pregnant. Polycystic ovarian syndrome (PCOS). What are the signs or symptoms? This condition may not cause symptoms. If you do have symptoms, they may include: Feeling more thirsty than normal. Needing to pee (urinate) more often than normal. Hunger. Feeling very tired. Blurry eyesight (vision). You may get other symptoms as the condition gets worse, such as: Dry mouth. Pain in your belly (abdomen). Not being hungry (loss of appetite). Breath that smells fruity. Weakness. Weight loss that is not planned. A tingling or numb feeling in your  hands or feet. A headache. Cuts or bruises that heal slowly. How is this treated? Treatment depends on the cause of your condition. Treatment may include: Taking medicine to control your blood sugar levels. Changing your medicine or dosage if you take insulin or other diabetes medicines. Lifestyle changes. These may include: Exercising more. Eating healthier foods. Losing weight. Treating an illness or infection. Checking your blood sugar more often. Stopping or reducing steroid medicines. If your condition gets very bad, you will need to be treated in the hospital. Follow these instructions at home: General instructions Take over-the-counter and prescription medicines only as told by your doctor. Do not smoke or use any products that contain nicotine or tobacco. If you need help quitting, ask your doctor. If you drink alcohol: Limit how much you have to: 0-1 drink a day for women who are not pregnant. 0-2 drinks a day for men. Know how much alcohol is in a drink. In the [place] one drink equals one 12 oz bottle of beer (355 mL), one 5 oz glass of wine (148 mL), or one 1 oz glass of hard liquor (44 mL). Manage stress. If you need help with this, ask your doctor. Do exercises as told by your doctor. Keep all follow-up visits. Eating and drinking  Stay at a healthy weight. Make sure you drink enough fluid when you: Exercise. Get sick. Are in hot temperatures. Drink enough fluid to keep your pee (urine) pale yellow. If you have diabetes:  Know the symptoms of high blood sugar. Follow your diabetes management plan as told by your doctor. Make sure you: Take insulin and medicines as told. Follow your exercise plan. Follow  your meal plan. Eat on time. Do not skip meals. Check your blood sugar as often as told. Make sure you check before and after exercise. If you exercise longer or in a different way, check your blood sugar more often. Follow your sick day plan whenever you  cannot eat or drink normally. Make this plan ahead of time with your doctor. Share your diabetes management plan with people in your workplace, school, and household. Check your pee for ketones when you are ill and as told by your doctor. Carry a card or wear jewelry that says that you have diabetes. Where to find more information American Diabetes Association: www.diabetes.org Contact a doctor if: Your blood sugar level is at or above 240 mg/dL (04.5 mmol/L) for 2 days in a row. You have problems keeping your blood sugar in your target range. You have high blood pressure often. You have signs of illness, such as: Feeling like you may vomit (feeling nauseous). Vomiting. A fever. Get help right away if: Your blood sugar monitor reads "high" even when you are taking insulin. You have trouble breathing. You have a change in how you think, feel, or act (mental status). You feel like you may vomit, and the feeling does not go away. You cannot stop vomiting. These symptoms may be an emergency. Get medical help right away. Call your local emergency services (911 in the [place] Do not wait to see if the symptoms will go away. Do not drive yourself to the hospital. Summary Hyperglycemia is when the sugar (glucose) level in your blood is too high. High blood sugar can happen to people who have or do not have diabetes. Make sure you drink enough fluids and follow your meal plan. Exercise as often as told by your doctor. Contact your doctor if you have problems keeping your blood sugar in your target range. This information is not intended to replace advice given to you by your health care provider. Make sure you discuss any questions you have with your health care provider. Document Revised: 09/26/2020 Document Reviewed: 09/27/2020 Elsevier Patient Education  2024 [employer]  Hypoglycemia Hypoglycemia is when the sugar (glucose) level in your blood is too low. Low blood sugar can happen to  people who have diabetes and people who do not have diabetes. Low blood sugar can happen quickly, and it can be an emergency. What are the causes? This condition happens most often in people who have diabetes. It may be caused by: Diabetes medicine. Not eating enough, or not eating often enough. Doing more physical activity. Drinking alcohol on an empty stomach. If you do not have diabetes, this condition may be caused by: A tumor in the pancreas. Not eating enough, or not eating for long periods at a time (fasting). A very bad infection or illness. Problems after having weight loss (bariatric) surgery. Kidney failure or liver failure. Certain medicines. What increases the risk? This condition is more likely to develop in people who: Have diabetes and take medicines to lower their blood sugar. Abuse alcohol. Have a very bad illness. What are the signs or symptoms? Mild Hunger. Sweating and feeling clammy. Feeling dizzy or light-headed. Being sleepy or having trouble sleeping. Feeling like you may vomit (nauseous). A fast heartbeat. A headache. Blurry vision. Mood changes, such as: Being grouchy. Feeling worried or nervous (anxious). Tingling or loss of feeling (numbness) around your mouth, lips, or tongue. Moderate Confusion and poor judgment. Behavior changes. Weakness. Uneven heartbeat. Trouble with moving (coordination). Very low  Very low blood sugar (severe hypoglycemia) is a medical emergency. It can cause: Fainting. Seizures. Loss of consciousness (coma). Death. How is this treated? Treating low blood sugar Low blood sugar is often treated by eating or drinking something that has sugar in it right away. The food or drink should contain 15 grams of a fast-acting carb (carbohydrate). Options include: 4 oz (120 mL) of fruit juice. 4 oz (120 mL) of regular soda (not diet soda). A few pieces of hard candy. Check food labels to see how many pieces to eat for 15  grams. 1 Tbsp (15 mL) of sugar or honey. 4 glucose tablets. 1 tube of glucose gel. Treating low blood sugar if you have diabetes If you can think clearly and swallow safely, follow the 15:15 rule: Take 15 grams of a fast-acting carb. Talk with your doctor about how much you should take. Always keep a source of fast-acting carb with you, such as: Glucose tablets (take 4 tablets). A few pieces of hard candy. Check food labels to see how many pieces to eat for 15 grams. 4 oz (120 mL) of fruit juice. 4 oz (120 mL) of regular soda (not diet soda). 1 Tbsp (15 mL) of honey or sugar. 1 tube of glucose gel. Check your blood sugar 15 minutes after you take the carb. If your blood sugar is still at or below 70 mg/dL (3.9 mmol/L), take 15 grams of a carb again. If your blood sugar does not go above 70 mg/dL (3.9 mmol/L) after 3 tries, get help right away. After your blood sugar goes back to normal, eat a meal or a snack within 1 hour.  Treating very low blood sugar If your blood sugar is below 54 mg/dL (3 mmol/L), you have very low blood sugar, or severe hypoglycemia. This is an emergency. Get medical help right away. If you have very low blood sugar and you cannot eat or drink, you will need to be given a hormone called glucagon. A family member or friend should learn how to check your blood sugar and how to give you glucagon. Ask your doctor if you need to have an emergency glucagon kit at home. Very low blood sugar may also need to be treated in a hospital. Follow these instructions at home: General instructions Take over-the-counter and prescription medicines only as told by your doctor. Stay aware of your blood sugar as told by your doctor. If you drink alcohol: Limit how much you have to: 0-1 drink a day for women who are not pregnant. 0-2 drinks a day for men. Know how much alcohol is in your drink. In the [place] one drink equals one 12 oz bottle of beer (355 mL), one 5 oz glass of wine (148  mL), or one 1 oz glass of hard liquor (44 mL). Be sure to eat food when you drink alcohol. Know that your body absorbs alcohol quickly. This may lead to low blood sugar later. Be sure to keep checking your blood sugar. Keep all follow-up visits. If you have diabetes:  Always have a fast-acting carb (15 grams) with you to treat low blood sugar. Follow your diabetes care plan as told by your doctor. Make sure you: Know the symptoms of low blood sugar. Check your blood sugar as often as told. Always check it before and after exercise. Always check your blood sugar before you drive. Take your medicines as told. Follow your meal plan. Eat on time. Do not skip meals. Share your diabetes care  plan with: Your work or school. People you live with. Carry a card or wear jewelry that says you have diabetes. Where to find more information American Diabetes Association: www.diabetes.org Contact a doctor if: You have trouble keeping your blood sugar in your target range. You have low blood sugar often. Get help right away if: You still have symptoms after you eat or drink something that contains 15 grams of fast-acting carb, and you cannot get your blood sugar above 70 mg/dL by following the 93:23 rule. Your blood sugar is below 54 mg/dL (3 mmol/L). You have a seizure. You faint. These symptoms may be an emergency. Get help right away. Call your local emergency services (911 in the [place] Do not wait to see if the symptoms will go away. Do not drive yourself to the hospital. Summary Hypoglycemia happens when the level of sugar (glucose) in your blood is too low. Low blood sugar can happen to people who have diabetes and people who do not have diabetes. Low blood sugar can happen quickly, and it can be an emergency. Make sure you know the symptoms of low blood sugar and know how to treat it. Always keep a source of sugar (fast-acting carb) with you to treat low blood sugar. This information is  not intended to replace advice given to you by your health care provider. Make sure you discuss any questions you have with your health care provider. Document Revised: 11/14/2020 Document Reviewed: 11/14/2020 Elsevier Patient Education  2024 [employer].

## 2024-01-17 ENCOUNTER — Ambulatory Visit: Payer: Self-pay

## 2024-01-17 DIAGNOSIS — E1165 Type 2 diabetes mellitus with hyperglycemia: Secondary | ICD-10-CM | POA: Diagnosis not present

## 2024-01-17 DIAGNOSIS — E1142 Type 2 diabetes mellitus with diabetic polyneuropathy: Secondary | ICD-10-CM | POA: Diagnosis not present

## 2024-01-17 DIAGNOSIS — J9601 Acute respiratory failure with hypoxia: Secondary | ICD-10-CM | POA: Diagnosis not present

## 2024-01-17 DIAGNOSIS — J4551 Severe persistent asthma with (acute) exacerbation: Secondary | ICD-10-CM | POA: Diagnosis not present

## 2024-01-17 DIAGNOSIS — I10 Essential (primary) hypertension: Secondary | ICD-10-CM | POA: Diagnosis not present

## 2024-01-17 DIAGNOSIS — K219 Gastro-esophageal reflux disease without esophagitis: Secondary | ICD-10-CM | POA: Diagnosis not present

## 2024-01-17 DIAGNOSIS — E1122 Type 2 diabetes mellitus with diabetic chronic kidney disease: Secondary | ICD-10-CM | POA: Diagnosis not present

## 2024-01-17 DIAGNOSIS — N183 Chronic kidney disease, stage 3 unspecified: Secondary | ICD-10-CM | POA: Diagnosis not present

## 2024-01-17 DIAGNOSIS — I422 Other hypertrophic cardiomyopathy: Secondary | ICD-10-CM | POA: Diagnosis not present

## 2024-01-17 NOTE — Telephone Encounter (Signed)
Patient called, left VM to return the call to the office to speak to the NT.    Summary: rx concern / stomach discomfort   The patient has recently ceased using predniSONE 5 MG and shares that they have began to experience stomach discomfort on 01/14/23  The patient would like to discuss their concerns further with a member of staff when possible  Please contact the patient when available

## 2024-01-17 NOTE — Telephone Encounter (Signed)
Chief Complaint: Flank pain  Symptoms: flank cramps, leg pain, foot pain, hand pain, mild SOB Frequency: comes and goes  Pertinent Negatives: Patient denies fever, chest pain, abdominal pain  Disposition: [] ED /[] Urgent Care (no appt availability in office) / [] Appointment(In office/virtual)/ []  [facility]/ [] Home Care/ [] Refused Recommended Disposition /[] [facility]/ []  Follow-up with PCP Additional Notes: Patient states she has felt bilateral flank pain, foot pain, hand pain and leg pain since Saturday and the pain comes and goes. Patient states she recently completed a course of prednisone and started have the symptoms shortly, but she is not sure if this is related to her symptoms. Patient states she has not tried any OTC medication to the symptoms. Care advice given and patient already has an appointment scheduled with PCP on 01/19/24 patient advised to report symptoms to PCP at visit and callback if symptoms get worse. Patient verbalized understanding.   Summary: rx concern / stomach discomfort   The patient has recently ceased using predniSONE 5 MG and shares that they have began to experience stomach discomfort on 01/14/23  The patient would like to discuss their concerns further with a member of staff when possible  Please contact the patient when available     Reason for Disposition  MODERATE pain (e.g., interferes with normal activities or awakens from sleep)  Answer Assessment - Initial Assessment Questions 1. LOCATION: "Where does it hurt?" (e.g., left, right)     Left and right 2. ONSET: "When did the pain start?"     Saturday  3. SEVERITY: "How bad is the pain?" (e.g., Scale 1-10; mild, moderate, or severe)   - MILD (1-3): doesn't interfere with normal activities    - MODERATE (4-7): interferes with normal activities or awakens from sleep    - SEVERE (8-10): excruciating pain and patient unable to do normal activities (stays in bed)       7/10 4.  PATTERN: "Does the pain come and go, or is it constant?"      Comes and goes  5. CAUSE: "What do you think is causing the pain?"     I'm not sure but I stop prednisone  6. OTHER SYMPTOMS:  "Do you have any other symptoms?" (e.g., fever, abdomen pain, vomiting, leg weakness, burning with urination, blood in urine)     Leg pain, hand pain, foot pain, mild SOB, cramping  Protocols used: Flank Pain-A-AH

## 2024-01-17 NOTE — Telephone Encounter (Signed)
2nd attempt, Patient called, left VM to return the call to the office to speak to the NT.

## 2024-01-18 ENCOUNTER — Encounter: Payer: Self-pay | Admitting: Family Medicine

## 2024-01-18 ENCOUNTER — Ambulatory Visit (INDEPENDENT_AMBULATORY_CARE_PROVIDER_SITE_OTHER): Payer: Medicare HMO | Admitting: Family Medicine

## 2024-01-18 VITALS — BP 118/70 | HR 81 | Resp 18 | Ht 64.0 in | Wt 173.9 lb

## 2024-01-18 DIAGNOSIS — R748 Abnormal levels of other serum enzymes: Secondary | ICD-10-CM

## 2024-01-18 DIAGNOSIS — I700 Atherosclerosis of aorta: Secondary | ICD-10-CM | POA: Diagnosis not present

## 2024-01-18 DIAGNOSIS — J8283 Eosinophilic asthma: Secondary | ICD-10-CM

## 2024-01-18 DIAGNOSIS — M62838 Other muscle spasm: Secondary | ICD-10-CM | POA: Insufficient documentation

## 2024-01-18 DIAGNOSIS — E1142 Type 2 diabetes mellitus with diabetic polyneuropathy: Secondary | ICD-10-CM | POA: Diagnosis not present

## 2024-01-18 DIAGNOSIS — I10 Essential (primary) hypertension: Secondary | ICD-10-CM | POA: Diagnosis not present

## 2024-01-18 DIAGNOSIS — K219 Gastro-esophageal reflux disease without esophagitis: Secondary | ICD-10-CM

## 2024-01-18 DIAGNOSIS — N1831 Chronic kidney disease, stage 3a: Secondary | ICD-10-CM | POA: Diagnosis not present

## 2024-01-18 DIAGNOSIS — J4550 Severe persistent asthma, uncomplicated: Secondary | ICD-10-CM | POA: Insufficient documentation

## 2024-01-18 DIAGNOSIS — J45909 Unspecified asthma, uncomplicated: Secondary | ICD-10-CM | POA: Insufficient documentation

## 2024-01-18 MED ORDER — EZETIMIBE 10 MG PO TABS: 10.0000 mg | ORAL_TABLET | Freq: Every day | ORAL | 3 refills | Status: DC

## 2024-01-18 MED ORDER — OZEMPIC (0.25 OR 0.5 MG/DOSE) 2 MG/3ML ~~LOC~~ SOPN: 0.5000 mg | PEN_INJECTOR | SUBCUTANEOUS | 0 refills | Status: DC

## 2024-01-18 MED ORDER — DILTIAZEM HCL ER COATED BEADS 180 MG PO CP24: 180.0000 mg | ORAL_CAPSULE | Freq: Every day | ORAL | 1 refills | Status: DC

## 2024-01-18 NOTE — Telephone Encounter (Signed)
Pt has an appt for today.

## 2024-01-18 NOTE — Telephone Encounter (Signed)
Needs an appt

## 2024-01-18 NOTE — Progress Notes (Signed)
Name: Brandy Johnston   MRN: [id number]    DOB: 1961-07-20   Date:01/18/2024       Progress Note  Subjective  Chief Complaint  Chief Complaint  Patient presents with   Pain    CRAMPS-bilateral hands, legs, foots has been taking ibuprofen   HPI   Patient came in today for refills and also because she continues to have muscle pain and shooting intermittent pain to her legs. She was recently admitted and advised to stop taking baclofen due to possible nocturnal hypoxemia versus OSA. Advised to resume baclofen prn during the day. Symptoms are brief and intermittent. We will also repeat labs to evaluated kidney function and CK  DMII: she needs refill of Ozempic, she would like to stay on 0.5 mg dose. She denies side effects of medication. She has CKI stage IIIa and we will recheck labs  Atherosclerosis of aorta: she is taking statin therapy, we will recheck CK, she has intermittent muscle cramps but is is chronic and recurent  Eosinophilic asthma: diagnosed at [facility] and is now getting referred to ENT and possible GI to rule out eosinophilic esophagitis  Patient Active Problem List   Diagnosis Date Noted   Eosinophilic asthma 01/18/2024   Muscle spasms of both lower extremities 01/18/2024   Chronic left shoulder pain 11/09/2022   Hyperlipidemia 01/14/2021   Incomplete tear of left rotator cuff 07/03/2020   Angioedema due to angiotensin converting enzyme inhibitor (ACE-I) 01/29/2020   Chronic bilateral back pain 07/26/2017   Coronary artery calcification 05/17/2017   Heart palpitations 05/15/2017   History of acute gastritis    Leukocytosis 09/30/2016   Atherosclerosis of abdominal aorta (HCC) 08/18/2016   Type 2 diabetes mellitus with stage 3 chronic kidney disease, without long-term current use of insulin (HCC) 12/16/2015   Diabetic neuropathy associated with type 2 diabetes mellitus (HCC) 09/18/2015   Insomnia 09/18/2015   Benign essential HTN 06/18/2015   Chronic kidney disease (CKD),  stage III (moderate) (HCC) 06/18/2015   Diabetes mellitus with renal manifestation (HCC) 06/18/2015   Elevated CK 06/18/2015   Obesity (BMI 30.0-34.9) 06/18/2015   Degenerative arthritis of hip 06/18/2015   Sickle cell trait (HCC) 06/18/2015   Dyslipidemia 05/27/2010   Gastroesophageal reflux disease 05/25/2008    Past Surgical History:  Procedure Laterality Date   BREAST BIOPSY Right 12/28/2019   stereo [facility] stromal fibrosis   CHOLECYSTECTOMY     COLONOSCOPY     ESOPHAGOGASTRODUODENOSCOPY (EGD) WITH PROPOFOL N/A 10/05/2016   Procedure: ESOPHAGOGASTRODUODENOSCOPY (EGD) WITH PROPOFOL;  Surgeon: Midge Minium, MD;  Location: [facility];  Service: Endoscopy;  Laterality: N/A;   FRACTURE SURGERY Left    cast and pins    SHOULDER ARTHROSCOPY WITH ROTATOR CUFF REPAIR AND SUBACROMIAL DECOMPRESSION Left 12/07/2018   Procedure: left shoulder manipulation under anesthesia, left shoulder arthroscopic lysis of adhesions;  Surgeon: Lyndle Herrlich, MD;  Location: [facility];  Service: Orthopedics;  Laterality: Left;   SHOULDER CLOSED REDUCTION Left 12/07/2018   Procedure: CLOSED MANIPULATION SHOULDER;  Surgeon: Lyndle Herrlich, MD;  Location: [facility];  Service: Orthopedics;  Laterality: Left;   shoulder surgery  Left 06/10/2018   Dr. Derryl Harbor   TUBAL LIGATION      Family History  Problem Relation Age of Onset   Migraines Mother    Diabetes Mother    Cancer Mother        lung   Arthritis Brother    Breast cancer Maternal Aunt    Cancer Maternal Uncle  Lung and Colon   Cirrhosis Brother    Breast cancer Cousin     Social History   Tobacco Use   Smoking status: Never   Smokeless tobacco: Never  Substance Use Topics   Alcohol use: Yes    Alcohol/week: 0.0 standard drinks of alcohol    Comment: rare     Current Outpatient Medications:    albuterol (VENTOLIN HFA) 108 (90 Base) MCG/ACT inhaler, SMARTSIG:2 Puff(s) By Mouth Every 4-6 Hours PRN, Disp: , Rfl:    aspirin  (ASPIRIN 81) 81 MG chewable tablet, Chew 1 tablet (81 mg total) by mouth daily., Disp: 30 tablet, Rfl: 0   baclofen (LIORESAL) 10 MG tablet, Take 1 tablet (10 mg total) by mouth 3 (three) times daily., Disp: 90 each, Rfl: 0   Blood Glucose Monitoring Suppl (CONTOUR NEXT ONE) KIT, USE TO TEST BLOOD SUGAR ONCE D, Disp: , Rfl:    Budeson-Glycopyrrol-Formoterol (BREZTRI AEROSPHERE) 160-9-4.8 MCG/ACT AERO, Inhale 2 puffs into the lungs in the morning and at bedtime., Disp: , Rfl:    Cholecalciferol (VITAMIN D) 2000 units CAPS, Take 1 capsule (2,000 Units total) by mouth daily., Disp: 30 capsule, Rfl: 0   Continuous Glucose Sensor (FREESTYLE LIBRE 3 SENSOR) MISC, 1 Device by Does not apply route every 14 (fourteen) days., Disp: , Rfl:    diclofenac Sodium (VOLTAREN) 1 % GEL, Apply 2 g topically 4 (four) times daily., Disp: 100 g, Rfl: 2   famotidine (PEPCID) 20 MG tablet, Take 1 tablet (20 mg total) by mouth 2 (two) times daily., Disp: 180 tablet, Rfl: 1   gabapentin (NEURONTIN) 300 MG capsule, Take 1 capsule (300 mg total) by mouth 3 (three) times daily., Disp: 270 capsule, Rfl: 1   glucose blood test strip, Use as instructed, Disp: 100 each, Rfl: 12   ipratropium-albuterol (DUONEB) 0.5-2.5 (3) MG/3ML SOLN, Take 3 mLs by nebulization every 6 (six) hours as needed., Disp: 360 mL, Rfl: 1   loratadine (CLARITIN) 10 MG tablet, Take by mouth., Disp: , Rfl:    magnesium oxide (MAG-OX) 400 MG tablet, Take 1 tablet (400 mg total) by mouth 2 (two) times daily., Disp: 180 tablet, Rfl: 1   meclizine (ANTIVERT) 25 MG tablet, Take 1 tablet (25 mg total) by mouth 3 (three) times daily as needed for dizziness., Disp: 30 tablet, Rfl: 0   Microlet Lancets MISC, USE TO CHECK BLOOD SUGAR ONCE D, Disp: , Rfl:    rosuvastatin (CRESTOR) 40 MG tablet, TAKE 1 TABLET(40 MG) BY MOUTH DAILY, Disp: 90 tablet, Rfl: 3   traZODone (DESYREL) 50 MG tablet, TAKE 1 TABLET(50 MG) BY MOUTH AT BEDTIME, Disp: 90 tablet, Rfl: 0   triamcinolone  cream (KENALOG) 0.1 %, Apply 1 Application topically 2 (two) times daily., Disp: 453.6 g, Rfl: 0   diltiazem (CARDIZEM CD) 180 MG 24 hr capsule, Take 1 capsule (180 mg total) by mouth daily., Disp: 90 capsule, Rfl: 1   ezetimibe (ZETIA) 10 MG tablet, Take 1 tablet (10 mg total) by mouth daily., Disp: 90 tablet, Rfl: 3   Semaglutide,0.25 or 0.5MG /DOS, (OZEMPIC, 0.25 OR 0.5 MG/DOSE,) 2 MG/3ML SOPN, Inject 0.5 mg into the skin once a week., Disp: 6 mL, Rfl: 0  Allergies  Allergen Reactions   Ace Inhibitors Swelling    Angioedema    Lisinopril Swelling    Face and neck swelling    I personally reviewed active problem list, medication list, allergies with the patient/caregiver today.   ROS  Ten systems reviewed and is negative  except as mentioned in HPI    Objective  Vitals:   01/18/24 1127  BP: 118/70  Pulse: 81  Resp: 18  SpO2: 96%  Weight: 173 lb 14.4 oz (78.9 kg)  Height: 5\' 4"  (1.626 m)    Body mass index is 29.85 kg/m.  Physical Exam  Constitutional: Patient appears well-developed and well-nourished.  No distress.  HEENT: head atraumatic, normocephalic, pupils equal and reactive to light, neck supple Cardiovascular: Normal rate, regular rhythm and normal heart sounds.  No murmur heard. No BLE edema. Pulmonary/Chest: Effort normal and breath sounds normal. No respiratory distress. Abdominal: Soft.  There is no tenderness. Psychiatric: Patient has a normal mood and affect. behavior is normal. Judgment and thought content normal.   Recent Results (from the past 2160 hours)  CBC with Differential/Platelet     Status: Abnormal   Collection Time: 11/18/23 10:09 AM  Result Value Ref Range   WBC 10.8 3.8 - 10.8 Thousand/uL   RBC 4.91 3.80 - 5.10 Million/uL   Hemoglobin 13.2 11.7 - 15.5 g/dL   HCT 65.7 84.6 - 96.2 %   MCV 83.7 80.0 - 100.0 fL   MCH 26.9 (L) 27.0 - 33.0 pg   MCHC 32.1 32.0 - 36.0 g/dL    Comment: For adults, a slight decrease in the calculated  MCHC value (in the range of 30 to 32 g/dL) is most likely not clinically significant; however, it should be interpreted with caution in correlation with other red cell parameters and the patient's clinical condition.    RDW 15.2 (H) 11.0 - 15.0 %   Platelets 241 140 - 400 Thousand/uL   MPV 11.2 7.5 - 12.5 fL   Neutro Abs 6,674 1,500 - 7,800 cells/uL   Absolute Lymphocytes 2,743 850 - 3,900 cells/uL   Absolute Monocytes 713 200 - 950 cells/uL   Eosinophils Absolute 583 (H) 15 - 500 cells/uL   Basophils Absolute 86 0 - 200 cells/uL   Neutrophils Relative % 61.8 %   Total Lymphocyte 25.4 %   Monocytes Relative 6.6 %   Eosinophils Relative 5.4 %   Basophils Relative 0.8 %  COMPLETE METABOLIC PANEL WITH GFR     Status: Abnormal   Collection Time: 11/18/23 10:09 AM  Result Value Ref Range   Glucose, Bld 81 65 - 99 mg/dL    Comment: .            Fasting reference interval .    BUN 11 7 - 25 mg/dL   Creat 9.52 (H) 8.41 - 1.05 mg/dL   eGFR 51 (L) > OR = 60 mL/min/1.72m2   BUN/Creatinine Ratio 9 6 - 22 (calc)   Sodium 141 135 - 146 mmol/L   Potassium 3.9 3.5 - 5.3 mmol/L   Chloride 104 98 - 110 mmol/L   CO2 27 20 - 32 mmol/L   Calcium 9.2 8.6 - 10.4 mg/dL   Total Protein 7.4 6.1 - 8.1 g/dL   Albumin 4.1 3.6 - 5.1 g/dL   Globulin 3.3 1.9 - 3.7 g/dL (calc)   AG Ratio 1.2 1.0 - 2.5 (calc)   Total Bilirubin 0.3 0.2 - 1.2 mg/dL   Alkaline phosphatase (APISO) 118 37 - 153 U/L   AST 18 10 - 35 U/L   ALT 12 6 - 29 U/L  Nitric oxide     Status: None   Collection Time: 12/16/23  4:09 PM  Result Value Ref Range   Nitric Oxide 163   POCT Glucose (CBG)  Status: Abnormal   Collection Time: 01/14/24  9:56 AM  Result Value Ref Range   POC Glucose 162 (A) 70 - 99 mg/dl    Diabetic Foot Exam:     PHQ2/9:    01/18/2024   11:27 AM 12/09/2023   11:21 AM 11/18/2023    9:23 AM 09/15/2023    9:26 AM 09/09/2023    9:37 AM  Depression screen PHQ 2/9  Decreased Interest 0 0 0 0 0   Down, Depressed, Hopeless 0 0 0 0 0  PHQ - 2 Score 0 0 0 0 0  Altered sleeping 0  0 0 0  Tired, decreased energy 0  0 0 0  Change in appetite 0  0 0 0  Feeling bad or failure about yourself  0  0 0 0  Trouble concentrating 0  0 0 0  Moving slowly or fidgety/restless 0  0 0 0  Suicidal thoughts 0  0 0 0  PHQ-9 Score 0  0 0 0  Difficult doing work/chores Not difficult at all   Not difficult at all Not difficult at all    phq 9 is negative  Fall Risk:    01/18/2024   11:17 AM 11/18/2023    9:23 AM 09/15/2023    9:26 AM 09/09/2023    9:37 AM 09/08/2023    9:50 AM  Fall Risk   Falls in the past year? 0 0 0 0 0  Number falls in past yr: 0  0 0 0  Injury with Fall? 0  0 0 0  Risk for fall due to : No Fall Risks No Fall Risks No Fall Risks No Fall Risks No Fall Risks  Follow up Falls prevention discussed;Education provided;Falls evaluation completed Falls prevention discussed Falls prevention discussed;Education provided;Falls evaluation completed Falls prevention discussed;Education provided;Falls evaluation completed Falls prevention discussed     Assessment and Plan    Eosinophilic Asthma Recent hospitalization due to severe wheezing, shortness of breath, and productive cough. Currently on Breztri and Ventolin as needed. -Continue Breztri and Ventolin as needed. -Consider initiating biological therapy if necessary.  Obstructive Sleep Apnea Recent home sleep study, results pending. -Await results of home sleep study - ordered by pulmonologist at [facility]  GERD and possible eosinophilic esophagitis Recently discovered during hospitalization. -Refer to ENT for further evaluation.   HTN -bp is controlled, continue current regiment     Atherosclerosis of aorta -on statin therapy    Chronic kidney disease Stage IIIa -recent drop of function during hospital stay we will recheck level   DM type II with peripheral neuropathy  -refill Ozempic  Muscle Cramps Patient reports  severe muscle cramps, possibly related to recent prednisone use. -Advise patient to use Baclofen as needed during the day for severe cramps, avoid at night due to possible OSA and recent episode of hypoxemia  General Health Maintenance -Order labs to check kidney function, cholesterol, and protein in urine. -Continue current medications including Aspirin, Vitamin D, Ozempic, Rosuvastatin, and Diclofenac as needed.

## 2024-01-19 ENCOUNTER — Ambulatory Visit: Payer: Medicare HMO | Admitting: Family Medicine

## 2024-01-19 ENCOUNTER — Encounter: Payer: Self-pay | Admitting: Family Medicine

## 2024-01-19 LAB — LIPID PANEL
Cholesterol: 179 mg/dL (ref <200)
HDL: 92 mg/dL (ref >=50)
LDL Cholesterol (Calc): 60 mg/dL
Non-HDL Cholesterol (Calc): 87 mg/dL (ref <130)
Total CHOL/HDL Ratio: 1.9 (calc) (ref <5.0)
Triglycerides: 203 mg/dL — ABNORMAL HIGH (ref <150)

## 2024-01-19 LAB — COMPLETE METABOLIC PANEL WITH GFR
AG Ratio: 1.5 (calc) (ref 1.0–2.5)
ALT: 42 U/L — ABNORMAL HIGH (ref 6–29)
AST: 27 U/L (ref 10–35)
Albumin: 4.1 g/dL (ref 3.6–5.1)
Alkaline phosphatase (APISO): 121 U/L (ref 37–153)
BUN/Creatinine Ratio: 10 (calc) (ref 6–22)
BUN: 15 mg/dL (ref 7–25)
CO2: 26 mmol/L (ref 20–32)
Calcium: 9.2 mg/dL (ref 8.6–10.4)
Chloride: 103 mmol/L (ref 98–110)
Creat: 1.44 mg/dL — ABNORMAL HIGH (ref 0.50–1.05)
Globulin: 2.8 g/dL (ref 1.9–3.7)
Glucose, Bld: 189 mg/dL — ABNORMAL HIGH (ref 65–99)
Potassium: 3.7 mmol/L (ref 3.5–5.3)
Sodium: 139 mmol/L (ref 135–146)
Total Bilirubin: 0.5 mg/dL (ref 0.2–1.2)
Total Protein: 6.9 g/dL (ref 6.1–8.1)
eGFR: 41 mL/min/{1.73_m2} — ABNORMAL LOW (ref >=60)

## 2024-01-19 LAB — CBC WITH DIFFERENTIAL/PLATELET
Absolute Lymphocytes: 2279 {cells}/uL (ref 850–3900)
Absolute Monocytes: 610 {cells}/uL (ref 200–950)
Basophils Absolute: 64 {cells}/uL (ref 0–200)
Basophils Relative: 0.6 %
Eosinophils Absolute: 342 {cells}/uL (ref 15–500)
Eosinophils Relative: 3.2 %
HCT: 41.9 % (ref 35.0–45.0)
Hemoglobin: 13.7 g/dL (ref 11.7–15.5)
MCH: 27.6 pg (ref 27.0–33.0)
MCHC: 32.7 g/dL (ref 32.0–36.0)
MCV: 84.5 fL (ref 80.0–100.0)
MPV: 11.3 fL (ref 7.5–12.5)
Monocytes Relative: 5.7 %
Neutro Abs: 7404 {cells}/uL (ref 1500–7800)
Neutrophils Relative %: 69.2 %
Platelets: 138 10*3/uL — ABNORMAL LOW (ref 140–400)
RBC: 4.96 10*6/uL (ref 3.80–5.10)
RDW: 15 % (ref 11.0–15.0)
Total Lymphocyte: 21.3 %
WBC: 10.7 10*3/uL (ref 3.8–10.8)

## 2024-01-19 LAB — MICROALBUMIN / CREATININE URINE RATIO
Creatinine, Urine: 135 mg/dL (ref 20–275)
Microalb Creat Ratio: 71 mg/g{creat} — ABNORMAL HIGH (ref <30)
Microalb, Ur: 9.6 mg/dL

## 2024-01-19 LAB — CK: Total CK: 127 U/L (ref 20–243)

## 2024-01-20 ENCOUNTER — Ambulatory Visit: Payer: Self-pay

## 2024-01-20 NOTE — Telephone Encounter (Signed)
Pt is scheduled for tomorrow as video visit

## 2024-01-20 NOTE — Telephone Encounter (Signed)
  Chief Complaint: Uncontrolled blood sugar Symptoms: none Frequency: ongoing Pertinent Negatives: Patient denies any s/s Disposition: [] ED /[] Urgent Care (no appt availability in office) / [x] Appointment(In office/virtual)/ []  [facility]/ [] Home Care/ [] Refused Recommended Disposition /[] [place] [facility]/ []  Follow-up with PCP Additional Notes: Pt states that in the morning her BS are low 58. Pt eats and blood sugar goes up to 207. Her breakfast was 2 eggs with toast and jelly. Pt  wants blood sugar to be more in normal range. Pt is interested in a referral to a dietician. MyChart Appt scheduled for tomorrow morning. Called pt back to review blood sugars that pt should call back for, and at home intervention for low blood sugar.  Summary: Blood sugar questions   Pt is calling in because she wants to know what could be causing her blood sugar to drop. Pt says in the morning time when she checks her sugar it's been in the 50's. Pt says her blood sugar is not currently low since she's eaten, but wants to know what could be causing the drop.     Reason for Disposition  [1] Morning (before breakfast) blood glucose < 80 mg/dL (4.4 mmol/L) AND [6] more than once in past week  Answer Assessment - Initial Assessment Questions 1. SYMPTOMS: "What symptoms are you concerned about?"     Blood sugar readings are low and then high  3. BLOOD GLUCOSE: "What is your blood glucose level?"      207 4. USUAL RANGE: "What is your blood glucose level usually?" (e.g., usual fasting morning value, usual evening value)     In the morning in the 50's after eating in the 200's 9. LOW BLOOD GLUCOSE TREATMENT: "What have you done so far to treat the low blood glucose level?"     eat 10. FOOD: "When did you last eat or drink?"       9:30 - 10:00am  Protocols used: Diabetes - Low Blood Sugar-A-AH

## 2024-01-21 ENCOUNTER — Ambulatory Visit: Payer: Medicare HMO | Admitting: Family Medicine

## 2024-01-21 ENCOUNTER — Telehealth: Payer: Self-pay | Admitting: Family Medicine

## 2024-01-21 ENCOUNTER — Encounter: Payer: Self-pay | Admitting: Family Medicine

## 2024-01-21 ENCOUNTER — Telehealth (INDEPENDENT_AMBULATORY_CARE_PROVIDER_SITE_OTHER): Payer: Medicare HMO | Admitting: Family Medicine

## 2024-01-21 DIAGNOSIS — R809 Proteinuria, unspecified: Secondary | ICD-10-CM

## 2024-01-21 DIAGNOSIS — R0902 Hypoxemia: Secondary | ICD-10-CM

## 2024-01-21 DIAGNOSIS — E1165 Type 2 diabetes mellitus with hyperglycemia: Secondary | ICD-10-CM | POA: Diagnosis not present

## 2024-01-21 DIAGNOSIS — E1129 Type 2 diabetes mellitus with other diabetic kidney complication: Secondary | ICD-10-CM

## 2024-01-21 DIAGNOSIS — R531 Weakness: Secondary | ICD-10-CM | POA: Diagnosis not present

## 2024-01-21 DIAGNOSIS — E1142 Type 2 diabetes mellitus with diabetic polyneuropathy: Secondary | ICD-10-CM | POA: Diagnosis not present

## 2024-01-21 DIAGNOSIS — J9601 Acute respiratory failure with hypoxia: Secondary | ICD-10-CM | POA: Diagnosis not present

## 2024-01-21 DIAGNOSIS — N1831 Chronic kidney disease, stage 3a: Secondary | ICD-10-CM | POA: Diagnosis not present

## 2024-01-21 DIAGNOSIS — K219 Gastro-esophageal reflux disease without esophagitis: Secondary | ICD-10-CM | POA: Diagnosis not present

## 2024-01-21 DIAGNOSIS — Z7985 Long-term (current) use of injectable non-insulin antidiabetic drugs: Secondary | ICD-10-CM

## 2024-01-21 DIAGNOSIS — E1122 Type 2 diabetes mellitus with diabetic chronic kidney disease: Secondary | ICD-10-CM | POA: Diagnosis not present

## 2024-01-21 DIAGNOSIS — N183 Chronic kidney disease, stage 3 unspecified: Secondary | ICD-10-CM | POA: Diagnosis not present

## 2024-01-21 DIAGNOSIS — I422 Other hypertrophic cardiomyopathy: Secondary | ICD-10-CM | POA: Diagnosis not present

## 2024-01-21 DIAGNOSIS — I10 Essential (primary) hypertension: Secondary | ICD-10-CM | POA: Diagnosis not present

## 2024-01-21 DIAGNOSIS — J4551 Severe persistent asthma with (acute) exacerbation: Secondary | ICD-10-CM | POA: Diagnosis not present

## 2024-01-21 MED ORDER — FREESTYLE LIBRE 3 SENSOR MISC: 1.0000 | 1 refills | Status: DC

## 2024-01-21 MED ORDER — EMPAGLIFLOZIN 25 MG PO TABS: 25.0000 mg | ORAL_TABLET | Freq: Every day | ORAL | 0 refills | Status: DC

## 2024-01-21 NOTE — Telephone Encounter (Signed)
Copied from CRM [id number]. Topic: General - Other >> Jan 21, 2024  9:21 AM Franchot Heidelberg wrote: Reason for CRM: Fax was submitted by Alona Bene on the 21st and refaxing today regarding order for patient.  Best contact:  [phone] ext [phone]

## 2024-01-21 NOTE — Progress Notes (Addendum)
Name: Brandy Johnston   MRN: [id number]    DOB: 1961-05-22   Date:01/21/2024       Progress Note  Virtual Visit via Video Note  I connected with Brandy Johnston on 01/21/24 at 11:20 AM EST by a video enabled telemedicine application and verified that I am speaking with the correct person using two identifiers.  Location: Patient: at home Provider: [facility]   I discussed the limitations of evaluation and management by telemedicine and the availability of in person appointments. The patient expressed understanding and agreed to proceed.   Subjective  Chief Complaint  Chief Complaint  Patient presents with   Diabetes    In the morning BS too low around 50s, after pt eats breakfast it goes up to 149   HPI   DMII with microalbuminuria and recent episodes of hypoglycemia. Last A1C was at goal at 6.3 %. She recently had a FreeStyle libre 3 sample that showed glucose dropping to 58 one time around 7 am and this morning down to 60's, highest in the 180's . She had an early dinner last night. She is currently on Ozempic 0.5 mg. She has microalbuminuria . Discussed not skipping meals, refill Brandy Johnston, consider bedtime snack , like nuts or greek yogurt. . We will add SGL-2 agonist due to microalbuminuria  Since admission tin Dec and diagnosed with hypoxemia she has episodes of dizziness and fatigue therefore has not been driving, she spoke to medicare who recommended PCS services until she is back to baseline . She is currently having home health care PT and respiratory therapist   Muscle spasms: doing better, taking baclofen during the day prn  Patient Active Problem List   Diagnosis Date Noted   Eosinophilic asthma 01/18/2024   Muscle spasms of both lower extremities 01/18/2024   Chronic left shoulder pain 11/09/2022   Hyperlipidemia 01/14/2021   Incomplete tear of left rotator cuff 07/03/2020   Angioedema due to angiotensin converting enzyme inhibitor (ACE-I) 01/29/2020   Chronic bilateral  back pain 07/26/2017   Coronary artery calcification 05/17/2017   Heart palpitations 05/15/2017   History of acute gastritis    Leukocytosis 09/30/2016   Atherosclerosis of abdominal aorta (HCC) 08/18/2016   Type 2 diabetes mellitus with stage 3 chronic kidney disease, without long-term current use of insulin (HCC) 12/16/2015   Diabetic neuropathy associated with type 2 diabetes mellitus (HCC) 09/18/2015   Insomnia 09/18/2015   Benign essential HTN 06/18/2015   Chronic kidney disease (CKD), stage III (moderate) (HCC) 06/18/2015   Diabetes mellitus with renal manifestation (HCC) 06/18/2015   Elevated CK 06/18/2015   Obesity (BMI 30.0-34.9) 06/18/2015   Degenerative arthritis of hip 06/18/2015   Sickle cell trait (HCC) 06/18/2015   Dyslipidemia 05/27/2010   Gastroesophageal reflux disease 05/25/2008    Past Surgical History:  Procedure Laterality Date   BREAST BIOPSY Right 12/28/2019   stereo [facility] stromal fibrosis   CHOLECYSTECTOMY     COLONOSCOPY     ESOPHAGOGASTRODUODENOSCOPY (EGD) WITH PROPOFOL N/A 10/05/2016   Procedure: ESOPHAGOGASTRODUODENOSCOPY (EGD) WITH PROPOFOL;  Surgeon: Midge Minium, MD;  Location: [facility];  Service: Endoscopy;  Laterality: N/A;   FRACTURE SURGERY Left    cast and pins    SHOULDER ARTHROSCOPY WITH ROTATOR CUFF REPAIR AND SUBACROMIAL DECOMPRESSION Left 12/07/2018   Procedure: left shoulder manipulation under anesthesia, left shoulder arthroscopic lysis of adhesions;  Surgeon: Lyndle Herrlich, MD;  Location: [facility];  Service: Orthopedics;  Laterality: Left;   SHOULDER CLOSED REDUCTION Left 12/07/2018  Procedure: CLOSED MANIPULATION SHOULDER;  Surgeon: Lyndle Herrlich, MD;  Location: [facility];  Service: Orthopedics;  Laterality: Left;   shoulder surgery  Left 06/10/2018   Dr. Derryl Harbor   TUBAL LIGATION      Family History  Problem Relation Age of Onset   Migraines Mother    Diabetes Mother    Cancer Mother        lung   Arthritis Brother     Breast cancer Maternal Aunt    Cancer Maternal Uncle        Lung and Colon   Cirrhosis Brother    Breast cancer Cousin     Social History   Tobacco Use   Smoking status: Never   Smokeless tobacco: Never  Substance Use Topics   Alcohol use: Yes    Alcohol/week: 0.0 standard drinks of alcohol    Comment: rare     Current Outpatient Medications:    albuterol (VENTOLIN HFA) 108 (90 Base) MCG/ACT inhaler, SMARTSIG:2 Puff(s) By Mouth Every 4-6 Hours PRN, Disp: , Rfl:    aspirin (ASPIRIN 81) 81 MG chewable tablet, Chew 1 tablet (81 mg total) by mouth daily., Disp: 30 tablet, Rfl: 0   baclofen (LIORESAL) 10 MG tablet, Take 1 tablet (10 mg total) by mouth 3 (three) times daily., Disp: 90 each, Rfl: 0   Blood Glucose Monitoring Suppl (CONTOUR NEXT ONE) KIT, USE TO TEST BLOOD SUGAR ONCE D, Disp: , Rfl:    Budeson-Glycopyrrol-Formoterol (BREZTRI AEROSPHERE) 160-9-4.8 MCG/ACT AERO, Inhale 2 puffs into the lungs in the morning and at bedtime., Disp: , Rfl:    Cholecalciferol (VITAMIN D) 2000 units CAPS, Take 1 capsule (2,000 Units total) by mouth daily., Disp: 30 capsule, Rfl: 0   Continuous Glucose Sensor (FREESTYLE LIBRE 3 SENSOR) MISC, 1 Device by Does not apply route every 14 (fourteen) days., Disp: , Rfl:    diclofenac Sodium (VOLTAREN) 1 % GEL, Apply 2 g topically 4 (four) times daily., Disp: 100 g, Rfl: 2   diltiazem (CARDIZEM CD) 180 MG 24 hr capsule, Take 1 capsule (180 mg total) by mouth daily., Disp: 90 capsule, Rfl: 1   ezetimibe (ZETIA) 10 MG tablet, Take 1 tablet (10 mg total) by mouth daily., Disp: 90 tablet, Rfl: 3   famotidine (PEPCID) 20 MG tablet, Take 1 tablet (20 mg total) by mouth 2 (two) times daily., Disp: 180 tablet, Rfl: 1   gabapentin (NEURONTIN) 300 MG capsule, Take 1 capsule (300 mg total) by mouth 3 (three) times daily., Disp: 270 capsule, Rfl: 1   glucose blood test strip, Use as instructed, Disp: 100 each, Rfl: 12   ipratropium-albuterol (DUONEB) 0.5-2.5 (3)  MG/3ML SOLN, Take 3 mLs by nebulization every 6 (six) hours as needed., Disp: 360 mL, Rfl: 1   loratadine (CLARITIN) 10 MG tablet, Take by mouth., Disp: , Rfl:    magnesium oxide (MAG-OX) 400 MG tablet, Take 1 tablet (400 mg total) by mouth 2 (two) times daily., Disp: 180 tablet, Rfl: 1   meclizine (ANTIVERT) 25 MG tablet, Take 1 tablet (25 mg total) by mouth 3 (three) times daily as needed for dizziness., Disp: 30 tablet, Rfl: 0   Microlet Lancets MISC, USE TO CHECK BLOOD SUGAR ONCE D, Disp: , Rfl:    rosuvastatin (CRESTOR) 40 MG tablet, TAKE 1 TABLET(40 MG) BY MOUTH DAILY, Disp: 90 tablet, Rfl: 3   Semaglutide,0.25 or 0.5MG /DOS, (OZEMPIC, 0.25 OR 0.5 MG/DOSE,) 2 MG/3ML SOPN, Inject 0.5 mg into the skin once a week., Disp: 6 mL,  Rfl: 0   traZODone (DESYREL) 50 MG tablet, TAKE 1 TABLET(50 MG) BY MOUTH AT BEDTIME, Disp: 90 tablet, Rfl: 0   triamcinolone cream (KENALOG) 0.1 %, Apply 1 Application topically 2 (two) times daily., Disp: 453.6 g, Rfl: 0  Allergies  Allergen Reactions   Ace Inhibitors Swelling    Angioedema    Lisinopril Swelling    Face and neck swelling    I personally reviewed active problem list, medication list, allergies with the patient/caregiver today.   ROS  Ten systems reviewed and is negative except as mentioned in HPI    Objective  There were no vitals filed for this visit.  There is no height or weight on file to calculate BMI.  Physical Exam  Awake, alert and oriented   Recent Results (from the past 2160 hours)  CBC with Differential/Platelet     Status: Abnormal   Collection Time: 11/18/23 10:09 AM  Result Value Ref Range   WBC 10.8 3.8 - 10.8 Thousand/uL   RBC 4.91 3.80 - 5.10 Million/uL   Hemoglobin 13.2 11.7 - 15.5 g/dL   HCT 16.1 09.6 - 04.5 %   MCV 83.7 80.0 - 100.0 fL   MCH 26.9 (L) 27.0 - 33.0 pg   MCHC 32.1 32.0 - 36.0 g/dL    Comment: For adults, a slight decrease in the calculated MCHC value (in the range of 30 to 32 g/dL) is most  likely not clinically significant; however, it should be interpreted with caution in correlation with other red cell parameters and the patient's clinical condition.    RDW 15.2 (H) 11.0 - 15.0 %   Platelets 241 140 - 400 Thousand/uL   MPV 11.2 7.5 - 12.5 fL   Neutro Abs 6,674 1,500 - 7,800 cells/uL   Absolute Lymphocytes 2,743 850 - 3,900 cells/uL   Absolute Monocytes 713 200 - 950 cells/uL   Eosinophils Absolute 583 (H) 15 - 500 cells/uL   Basophils Absolute 86 0 - 200 cells/uL   Neutrophils Relative % 61.8 %   Total Lymphocyte 25.4 %   Monocytes Relative 6.6 %   Eosinophils Relative 5.4 %   Basophils Relative 0.8 %  COMPLETE METABOLIC PANEL WITH GFR     Status: Abnormal   Collection Time: 11/18/23 10:09 AM  Result Value Ref Range   Glucose, Bld 81 65 - 99 mg/dL    Comment: .            Fasting reference interval .    BUN 11 7 - 25 mg/dL   Creat 4.09 (H) 8.11 - 1.05 mg/dL   eGFR 51 (L) > OR = 60 mL/min/1.50m2   BUN/Creatinine Ratio 9 6 - 22 (calc)   Sodium 141 135 - 146 mmol/L   Potassium 3.9 3.5 - 5.3 mmol/L   Chloride 104 98 - 110 mmol/L   CO2 27 20 - 32 mmol/L   Calcium 9.2 8.6 - 10.4 mg/dL   Total Protein 7.4 6.1 - 8.1 g/dL   Albumin 4.1 3.6 - 5.1 g/dL   Globulin 3.3 1.9 - 3.7 g/dL (calc)   AG Ratio 1.2 1.0 - 2.5 (calc)   Total Bilirubin 0.3 0.2 - 1.2 mg/dL   Alkaline phosphatase (APISO) 118 37 - 153 U/L   AST 18 10 - 35 U/L   ALT 12 6 - 29 U/L  Nitric oxide     Status: None   Collection Time: 12/16/23  4:09 PM  Result Value Ref Range   Nitric Oxide 163  POCT Glucose (CBG)     Status: Abnormal   Collection Time: 01/14/24  9:56 AM  Result Value Ref Range   POC Glucose 162 (A) 70 - 99 mg/dl  Lipid panel     Status: Abnormal   Collection Time: 01/18/24 12:15 PM  Result Value Ref Range   Cholesterol 179 <200 mg/dL   HDL 92 > OR = 50 mg/dL   Triglycerides 161 (H) <150 mg/dL    Comment: . If a non-fasting specimen was collected, consider repeat  triglyceride testing on a fasting specimen if clinically indicated.  Perry Mount et al. J. of Clin. Lipidol. 2015;9:129-169. Marland Kitchen    LDL Cholesterol (Calc) 60 mg/dL (calc)    Comment: Reference range: <100 . Desirable range <100 mg/dL for primary prevention;   <70 mg/dL for patients with CHD or diabetic patients  with > or = 2 CHD risk factors. Marland Kitchen LDL-C is now calculated using the Martin-Hopkins  calculation, which is a validated novel method providing  better accuracy than the Friedewald equation in the  estimation of LDL-C.  Horald Pollen et al. Lenox Ahr. 0960;454(09): 2061-2068  (http://education.[employer].com/faq/FAQ164)    Total CHOL/HDL Ratio 1.9 <5.0 (calc)   Non-HDL Cholesterol (Calc) 87 <811 mg/dL (calc)    Comment: For patients with diabetes plus 1 major ASCVD risk  factor, treating to a non-HDL-C goal of <100 mg/dL  (LDL-C of <91 mg/dL) is considered a therapeutic  option.   Microalbumin / creatinine urine ratio     Status: Abnormal   Collection Time: 01/18/24 12:15 PM  Result Value Ref Range   Creatinine, Urine 135 20 - 275 mg/dL   Microalb, Ur 9.6 mg/dL    Comment: Reference Range Not established    Microalb Creat Ratio 71 (H) <30 mg/g creat    Comment: . The ADA defines abnormalities in albumin excretion as follows: Marland Kitchen Albuminuria Category        Result (mg/g creatinine) . Normal to Mildly increased   <30 Moderately increased         30-299  Severely increased           > OR = 300 . The ADA recommends that at least two of three specimens collected within a 3-6 month period be abnormal before considering a patient to be within a diagnostic category.   CBC with Differential/Platelet     Status: Abnormal   Collection Time: 01/18/24 12:15 PM  Result Value Ref Range   WBC 10.7 3.8 - 10.8 Thousand/uL   RBC 4.96 3.80 - 5.10 Million/uL   Hemoglobin 13.7 11.7 - 15.5 g/dL   HCT 47.8 29.5 - 62.1 %   MCV 84.5 80.0 - 100.0 fL   MCH 27.6 27.0 - 33.0 pg   MCHC 32.7  32.0 - 36.0 g/dL    Comment: For adults, a slight decrease in the calculated MCHC value (in the range of 30 to 32 g/dL) is most likely not clinically significant; however, it should be interpreted with caution in correlation with other red cell parameters and the patient's clinical condition.    RDW 15.0 11.0 - 15.0 %   Platelets 138 (L) 140 - 400 Thousand/uL   MPV 11.3 7.5 - 12.5 fL   Neutro Abs 7,404 1,500 - 7,800 cells/uL   Absolute Lymphocytes 2,279 850 - 3,900 cells/uL   Absolute Monocytes 610 200 - 950 cells/uL   Eosinophils Absolute 342 15 - 500 cells/uL   Basophils Absolute 64 0 - 200 cells/uL   Neutrophils Relative % 69.2 %  Total Lymphocyte 21.3 %   Monocytes Relative 5.7 %   Eosinophils Relative 3.2 %   Basophils Relative 0.6 %   Smear Review      Comment: Review of peripheral smear confirms automated results.   COMPLETE METABOLIC PANEL WITH GFR     Status: Abnormal   Collection Time: 01/18/24 12:15 PM  Result Value Ref Range   Glucose, Bld 189 (H) 65 - 99 mg/dL    Comment: .            Fasting reference interval . For someone without known diabetes, a glucose value >125 mg/dL indicates that they may have diabetes and this should be confirmed with a follow-up test. .    BUN 15 7 - 25 mg/dL   Creat 5.28 (H) 4.13 - 1.05 mg/dL   eGFR 41 (L) > OR = 60 mL/min/1.37m2   BUN/Creatinine Ratio 10 6 - 22 (calc)   Sodium 139 135 - 146 mmol/L   Potassium 3.7 3.5 - 5.3 mmol/L   Chloride 103 98 - 110 mmol/L   CO2 26 20 - 32 mmol/L   Calcium 9.2 8.6 - 10.4 mg/dL   Total Protein 6.9 6.1 - 8.1 g/dL   Albumin 4.1 3.6 - 5.1 g/dL   Globulin 2.8 1.9 - 3.7 g/dL (calc)   AG Ratio 1.5 1.0 - 2.5 (calc)   Total Bilirubin 0.5 0.2 - 1.2 mg/dL   Alkaline phosphatase (APISO) 121 37 - 153 U/L   AST 27 10 - 35 U/L   ALT 42 (H) 6 - 29 U/L  CK (Creatine Kinase)     Status: None   Collection Time: 01/18/24 12:15 PM  Result Value Ref Range   Total CK 127 20 - 243 U/L    Diabetic  Foot Exam:     PHQ2/9:    01/18/2024   11:27 AM 12/09/2023   11:21 AM 11/18/2023    9:23 AM 09/15/2023    9:26 AM 09/09/2023    9:37 AM  Depression screen PHQ 2/9  Decreased Interest 0 0 0 0 0  Down, Depressed, Hopeless 0 0 0 0 0  PHQ - 2 Score 0 0 0 0 0  Altered sleeping 0  0 0 0  Tired, decreased energy 0  0 0 0  Change in appetite 0  0 0 0  Feeling bad or failure about yourself  0  0 0 0  Trouble concentrating 0  0 0 0  Moving slowly or fidgety/restless 0  0 0 0  Suicidal thoughts 0  0 0 0  PHQ-9 Score 0  0 0 0  Difficult doing work/chores Not difficult at all   Not difficult at all Not difficult at all    phq 9 is negative  Fall Risk:    01/18/2024   11:17 AM 11/18/2023    9:23 AM 09/15/2023    9:26 AM 09/09/2023    9:37 AM 09/08/2023    9:50 AM  Fall Risk   Falls in the past year? 0 0 0 0 0  Number falls in past yr: 0  0 0 0  Injury with Fall? 0  0 0 0  Risk for fall due to : No Fall Risks No Fall Risks No Fall Risks No Fall Risks No Fall Risks  Follow up Falls prevention discussed;Education provided;Falls evaluation completed Falls prevention discussed Falls prevention discussed;Education provided;Falls evaluation completed Falls prevention discussed;Education provided;Falls evaluation completed Falls prevention discussed     Assessment & Plan  1.  Stage 3a chronic kidney disease (HCC) (Primary)  - empagliflozin (JARDIANCE) 25 MG TABS tablet; Take 1 tablet (25 mg total) by mouth daily before breakfast.  Dispense: 90 tablet; Refill: 0  2. Diabetes mellitus with microalbuminuria (HCC)  - Continuous Glucose Sensor (FREESTYLE LIBRE 3 SENSOR) MISC; 1 Device by Does not apply route every 14 (fourteen) days.  Dispense: 6 each; Refill: 1 - empagliflozin (JARDIANCE) 25 MG TABS tablet; Take 1 tablet (25 mg total) by mouth daily before breakfast.  Dispense: 90 tablet; Refill: 0  3. Weakness  Referral PCS to help with transportation to doctors visits , pharmacy, help  around the house, using a walker now  4. Hypoxemia  Keep visits with pulmonologist      I discussed the assessment and treatment plan with the patient. The patient was provided an opportunity to ask questions and all were answered. The patient agreed with the plan and demonstrated an understanding of the instructions.   The patient was advised to call back or seek an in-person evaluation if the symptoms worsen or if the condition fails to improve as anticipated.  I provided 25 minutes of non-face-to-face time during this encounter.   Ruel Favors, MD

## 2024-01-24 ENCOUNTER — Ambulatory Visit: Payer: Self-pay

## 2024-01-24 ENCOUNTER — Ambulatory Visit: Payer: Medicare HMO | Admitting: Family Medicine

## 2024-01-24 ENCOUNTER — Telehealth: Payer: Self-pay | Admitting: Family Medicine

## 2024-01-24 DIAGNOSIS — I10 Essential (primary) hypertension: Secondary | ICD-10-CM | POA: Diagnosis not present

## 2024-01-24 DIAGNOSIS — J9601 Acute respiratory failure with hypoxia: Secondary | ICD-10-CM | POA: Diagnosis not present

## 2024-01-24 DIAGNOSIS — K219 Gastro-esophageal reflux disease without esophagitis: Secondary | ICD-10-CM | POA: Diagnosis not present

## 2024-01-24 DIAGNOSIS — E7800 Pure hypercholesterolemia, unspecified: Secondary | ICD-10-CM | POA: Diagnosis not present

## 2024-01-24 DIAGNOSIS — Z20822 Contact with and (suspected) exposure to covid-19: Secondary | ICD-10-CM | POA: Diagnosis not present

## 2024-01-24 DIAGNOSIS — E1122 Type 2 diabetes mellitus with diabetic chronic kidney disease: Secondary | ICD-10-CM | POA: Diagnosis not present

## 2024-01-24 DIAGNOSIS — N183 Chronic kidney disease, stage 3 unspecified: Secondary | ICD-10-CM | POA: Diagnosis not present

## 2024-01-24 DIAGNOSIS — E1165 Type 2 diabetes mellitus with hyperglycemia: Secondary | ICD-10-CM | POA: Diagnosis not present

## 2024-01-24 DIAGNOSIS — E119 Type 2 diabetes mellitus without complications: Secondary | ICD-10-CM | POA: Diagnosis not present

## 2024-01-24 DIAGNOSIS — R519 Headache, unspecified: Secondary | ICD-10-CM | POA: Diagnosis not present

## 2024-01-24 DIAGNOSIS — E1142 Type 2 diabetes mellitus with diabetic polyneuropathy: Secondary | ICD-10-CM | POA: Diagnosis not present

## 2024-01-24 DIAGNOSIS — Z7982 Long term (current) use of aspirin: Secondary | ICD-10-CM | POA: Diagnosis not present

## 2024-01-24 DIAGNOSIS — J4551 Severe persistent asthma with (acute) exacerbation: Secondary | ICD-10-CM | POA: Diagnosis not present

## 2024-01-24 DIAGNOSIS — R0602 Shortness of breath: Secondary | ICD-10-CM | POA: Diagnosis not present

## 2024-01-24 DIAGNOSIS — I422 Other hypertrophic cardiomyopathy: Secondary | ICD-10-CM | POA: Diagnosis not present

## 2024-01-24 DIAGNOSIS — I129 Hypertensive chronic kidney disease with stage 1 through stage 4 chronic kidney disease, or unspecified chronic kidney disease: Secondary | ICD-10-CM | POA: Diagnosis not present

## 2024-01-24 NOTE — Telephone Encounter (Signed)
SOB with talking  HA started 7/10- eyes with seeing spots [facility] Friday evening Cough runny nose  Asthma HTN  Tightness chest  BP 160/80 O2 94%HR 91 Normally BP runs BP usual 132-150-60's 96 98% Jardiance severity 2 - with diltiazem: may have severity 2 Reason for Disposition  Difficulty breathing  Answer Assessment - Initial Assessment Questions 1. LOCATION: "Where does it hurt?"       Chest tightness 2. RADIATION: "Does the pain go anywhere else?" (e.g., into neck, jaw, arms, back)     no 3. ONSET: "When did the chest pain begin?" (Minutes, hours or days)      Friday evening 4. PATTERN: "Does the pain come and go, or has it been constant since it started?"  "Does it get worse with exertion?"      constant 5. DURATION: "How long does it last" (e.g., seconds, minutes, hours)     N/a 6. SEVERITY: "How bad is the pain?"  (e.g., Scale 1-10; mild, moderate, or severe)    - MILD (1-3): doesn't interfere with normal activities     - MODERATE (4-7): interferes with normal activities or awakens from sleep    - SEVERE (8-10): excruciating pain, unable to do any normal activities       4/10 7. CARDIAC RISK FACTORS: "Do you have any history of heart problems or risk factors for heart disease?" (e.g., angina, prior heart attack; diabetes, high blood pressure, high cholesterol, smoker, or strong family history of heart disease)     HTN, diabetes 8. PULMONARY RISK FACTORS: "Do you have any history of lung disease?"  (e.g., blood clots in lung, asthma, emphysema, birth control pills)     asthma  10. OTHER SYMPTOMS: "Do you have any other symptoms?" (e.g., dizziness, nausea, vomiting, sweating, fever, difficulty breathing, cough)       SOB with talking, O2 sat 94, Brandy Johnston saw pt having difficulty breathing, cough, runny nose, headache x 4 days contant, BP out of range for pt at 160/80 HR 91  Protocols used: Chest Pain-A-AH

## 2024-01-24 NOTE — Telephone Encounter (Signed)
Error pt states she will call back

## 2024-01-24 NOTE — Telephone Encounter (Signed)
Brandy Johnston from [facility] stated that Jardiance and diltiazem has a category 2 interaction.

## 2024-01-24 NOTE — Telephone Encounter (Signed)
Pt called for other issue but is asking for PCS form to be sent to [facility] fax: [phone] Attn: Bluford Kaufmann phone number [phone].   Also pharmacy is asking for dx code for the [facility].     Kendal Hymen from [facility] stated that Jardiance and diltiazem has a category 2 interaction.

## 2024-01-24 NOTE — Telephone Encounter (Signed)
Working on [employer] form

## 2024-01-25 ENCOUNTER — Telehealth: Payer: Self-pay

## 2024-01-25 DIAGNOSIS — R0602 Shortness of breath: Secondary | ICD-10-CM | POA: Diagnosis not present

## 2024-01-25 NOTE — Transitions of Care (Post Inpatient/ED Visit) (Signed)
01/25/2024  Name: Brandy Johnston MRN: [id number] DOB: 02-26-1961  Today's TOC FU Call Status: Today's TOC FU Call Status:: Successful TOC FU Call Completed TOC FU Call Complete Date: 01/25/24 Patient's Name and Date of Birth confirmed.  Transition Care Management Follow-up Telephone Call Date of Discharge: 01/24/24 Discharge Facility: Other [facility]) Name of Other (Non-[facility]) Discharge Facility: [facility] Type of Discharge: Emergency Department Reason for ED Visit: Other: [facility])  Items Reviewed: Did you receive and understand the discharge instructions provided?: Yes Medications obtained,verified, and reconciled?: Yes (Medications Reviewed) Any new allergies since your discharge?: No Dietary orders reviewed?: Yes Do you have support at home?: Yes People in Home: child(ren), adult  Medications Reviewed Today: Medications Reviewed Today     Reviewed by Karena Addison, LPN (Licensed Practical Nurse) on 01/25/24 at 1238  Med List Status: <None>   Medication Order Taking? Sig Documenting Provider Last Dose Status Informant  albuterol (VENTOLIN HFA) 108 (90 Base) MCG/ACT inhaler [id number] No SMARTSIG:2 Puff(s) By Mouth Every 4-6 Hours PRN [provider] Taking Active   aspirin (ASPIRIN 81) 81 MG chewable tablet [id number] No Chew 1 tablet (81 mg total) by mouth daily. Alba Cory, MD Taking Active Self  baclofen (LIORESAL) 10 MG tablet [id number] No Take 1 tablet (10 mg total) by mouth 3 (three) times daily. Alba Cory, MD Taking Active   Blood Glucose Monitoring Suppl (CONTOUR NEXT ONE) KIT [id number] No USE TO TEST BLOOD SUGAR ONCE D [provider] Taking Active Self  Budeson-Glycopyrrol-Formoterol (BREZTRI AEROSPHERE) 160-9-4.8 MCG/ACT AERO [id number] No Inhale 2 puffs into the lungs in the morning and at bedtime. [provider] Taking Active   Cholecalciferol (VITAMIN D) 2000 units CAPS [id number] No Take 1 capsule (2,000 Units total) by  mouth daily. Alba Cory, MD Taking Active Self  Continuous Glucose Sensor (FREESTYLE LIBRE 3 SENSOR) [place] [id number]  1 Device by Does not apply route every 14 (fourteen) days. Alba Cory, MD  Active   diclofenac Sodium (VOLTAREN) 1 % GEL [id number] No Apply 2 g topically 4 (four) times daily. Alba Cory, MD Taking Active   diltiazem (CARDIZEM CD) 180 MG 24 hr capsule [id number] No Take 1 capsule (180 mg total) by mouth daily. Alba Cory, MD Taking Active   empagliflozin (JARDIANCE) 25 MG TABS tablet [id number]  Take 1 tablet (25 mg total) by mouth daily before breakfast. Alba Cory, MD  Active   ezetimibe (ZETIA) 10 MG tablet [id number] No Take 1 tablet (10 mg total) by mouth daily. Alba Cory, MD Taking Active   famotidine (PEPCID) 20 MG tablet [id number] No Take 1 tablet (20 mg total) by mouth 2 (two) times daily. Alba Cory, MD Taking Active   gabapentin (NEURONTIN) 300 MG capsule [id number] No Take 1 capsule (300 mg total) by mouth 3 (three) times daily. Alba Cory, MD Taking Active   glucose blood test strip [id number] No Use as instructed Alba Cory, MD Taking Active Self  ipratropium-albuterol (DUONEB) 0.5-2.5 (3) MG/3ML SOLN [id number] No Take 3 mLs by nebulization every 6 (six) hours as needed. Berniece Salines, FNP Taking Active     Discontinued 01/29/20 1715   loratadine (CLARITIN) 10 MG tablet [id number] No Take by mouth. [provider] Taking Active   magnesium oxide (MAG-OX) 400 MG tablet [id number] No Take 1 tablet (400 mg total) by mouth 2 (two) times daily. Alba Cory, MD Taking Active   meclizine (ANTIVERT) 25 MG tablet [id number] No Take 1 tablet (25 mg total) by mouth 3 (three)  times daily as needed for dizziness. Corena Herter, MD Taking Active   Microlet Lancets MISC [id number] No USE TO CHECK BLOOD SUGAR ONCE D [provider] Taking Active Self  rosuvastatin (CRESTOR) 40 MG tablet [id number] No TAKE 1 TABLET(40 MG) BY  MOUTH DAILY Sowles, Danna Hefty, MD Taking Active   Semaglutide,0.25 or 0.5MG /DOS, (OZEMPIC, 0.25 OR 0.5 MG/DOSE,) 2 MG/3ML SOPN [id number] No Inject 0.5 mg into the skin once a week. Alba Cory, MD Taking Active   traZODone (DESYREL) 50 MG tablet [id number] No TAKE 1 TABLET(50 MG) BY MOUTH AT BEDTIME Alba Cory, MD Taking Active   triamcinolone cream (KENALOG) 0.1 % [id number] No Apply 1 Application topically 2 (two) times daily. Alba Cory, MD Taking Active             Home Care and Equipment/Supplies: Were Home Health Services Ordered?: NA Any new equipment or medical supplies ordered?: NA  Functional Questionnaire: Do you need assistance with bathing/showering or dressing?: No Do you need assistance with meal preparation?: No Do you need assistance with eating?: No Do you have difficulty maintaining continence: No Do you need assistance with getting out of bed/getting out of a chair/moving?: No Do you have difficulty managing or taking your medications?: No  Follow up appointments reviewed: PCP Follow-up appointment confirmed?: Yes Date of PCP follow-up appointment?: 01/26/24 Follow-up Provider: [facility] Follow-up appointment confirmed?: NA Do you need transportation to your follow-up appointment?: No Do you understand care options if your condition(s) worsen?: Yes-patient verbalized understanding    SIGNATURE Karena Addison, LPN [facility] Nurse Health Advisor Direct Dial [phone]

## 2024-01-25 NOTE — Telephone Encounter (Signed)
PCS form filled out waiting on PCP to sign and will fax

## 2024-01-26 ENCOUNTER — Ambulatory Visit (INDEPENDENT_AMBULATORY_CARE_PROVIDER_SITE_OTHER): Payer: Medicare HMO | Admitting: Family Medicine

## 2024-01-26 ENCOUNTER — Encounter: Payer: Self-pay | Admitting: Family Medicine

## 2024-01-26 VITALS — BP 134/80 | HR 107 | Resp 16 | Ht 64.0 in | Wt 173.1 lb

## 2024-01-26 DIAGNOSIS — I129 Hypertensive chronic kidney disease with stage 1 through stage 4 chronic kidney disease, or unspecified chronic kidney disease: Secondary | ICD-10-CM | POA: Diagnosis not present

## 2024-01-26 DIAGNOSIS — G43011 Migraine without aura, intractable, with status migrainosus: Secondary | ICD-10-CM | POA: Diagnosis not present

## 2024-01-26 DIAGNOSIS — E1122 Type 2 diabetes mellitus with diabetic chronic kidney disease: Secondary | ICD-10-CM | POA: Diagnosis not present

## 2024-01-26 DIAGNOSIS — R944 Abnormal results of kidney function studies: Secondary | ICD-10-CM

## 2024-01-26 DIAGNOSIS — E1142 Type 2 diabetes mellitus with diabetic polyneuropathy: Secondary | ICD-10-CM | POA: Diagnosis not present

## 2024-01-26 DIAGNOSIS — I422 Other hypertrophic cardiomyopathy: Secondary | ICD-10-CM | POA: Diagnosis not present

## 2024-01-26 DIAGNOSIS — E1165 Type 2 diabetes mellitus with hyperglycemia: Secondary | ICD-10-CM | POA: Diagnosis not present

## 2024-01-26 DIAGNOSIS — J4551 Severe persistent asthma with (acute) exacerbation: Secondary | ICD-10-CM | POA: Diagnosis not present

## 2024-01-26 DIAGNOSIS — G44209 Tension-type headache, unspecified, not intractable: Secondary | ICD-10-CM

## 2024-01-26 DIAGNOSIS — I739 Peripheral vascular disease, unspecified: Secondary | ICD-10-CM

## 2024-01-26 DIAGNOSIS — N183 Chronic kidney disease, stage 3 unspecified: Secondary | ICD-10-CM

## 2024-01-26 DIAGNOSIS — J45909 Unspecified asthma, uncomplicated: Secondary | ICD-10-CM | POA: Diagnosis not present

## 2024-01-26 DIAGNOSIS — J9601 Acute respiratory failure with hypoxia: Secondary | ICD-10-CM | POA: Diagnosis not present

## 2024-01-26 DIAGNOSIS — I10 Essential (primary) hypertension: Secondary | ICD-10-CM | POA: Diagnosis not present

## 2024-01-26 DIAGNOSIS — K219 Gastro-esophageal reflux disease without esophagitis: Secondary | ICD-10-CM | POA: Diagnosis not present

## 2024-01-26 MED ORDER — QULIPTA 30 MG PO TABS: 1.0000 | ORAL_TABLET | Freq: Every day | ORAL | 2 refills | Status: DC

## 2024-01-26 MED ORDER — BUTALBITAL-APAP-CAFFEINE 50-325-40 MG PO TABS: 1.0000 | ORAL_TABLET | Freq: Four times a day (QID) | ORAL | 0 refills | Status: DC | PRN

## 2024-01-26 MED ORDER — LORATADINE 10 MG PO TABS: 10.0000 mg | ORAL_TABLET | Freq: Every day | ORAL | 1 refills | Status: DC

## 2024-01-26 MED ORDER — UBRELVY 100 MG PO TABS: 1.0000 | ORAL_TABLET | Freq: Every day | ORAL | 0 refills | Status: DC | PRN

## 2024-01-26 MED ORDER — KETOROLAC TROMETHAMINE 60 MG/2ML IM SOLN
60.0000 mg | Freq: Once | INTRAMUSCULAR | Status: AC
  Administered 2024-01-26: 60 mg via INTRAMUSCULAR

## 2024-01-26 MED ORDER — PROMETHAZINE HCL 50 MG/ML IJ SOLN
25.0000 mg | Freq: Four times a day (QID) | INTRAMUSCULAR | Status: DC | PRN
  Administered 2024-01-26: 25 mg via INTRAMUSCULAR

## 2024-01-26 NOTE — Progress Notes (Signed)
Name: Brandy Johnston   MRN: [id number]    DOB: Feb 20, 1961   Date:01/26/2024       Progress Note  Subjective  Chief Complaint  Chief Complaint  Patient presents with   ER Follow Up    Still having HA     Discussed the use of AI scribe software for clinical note transcription with the patient, who gave verbal consent to proceed.  History of Present Illness   The patient, with chronic migraines and COPD, presents with worsening shortness of breath and chronic migraines.  The patient has been experiencing worsening shortness of breath since a transition of care visit on January 21, 2024. Symptoms include hypoxemia, dizziness, and fatigue . She is currently receiving home health care and using an inhaler, taking two puffs in the morning and two at night. Wheezing has returned after stopping steroids, and she has an upcoming  appointment with a pulmonologist.  Chronic migraines have persisted for the past five to six days, characterized by sharp pain extending into the ears, behind the eyes, and down the neck. The headaches are continuous, with associated light and sound sensitivity. She has a history of migraines, previously managed with Fioricet, but has not used specific migraine medications like Ubrelvy or Nurtec. She is currently taking diltiazem and has used Botox for muscle spasms. Balance issues, described as feeling 'like leaning' when walking, are attributed to the headaches.  She has a history of chronic kidney disease, with a recent significant drop in GFR from 46 to 36, noted during a recent emergency room visit. Type 2 diabetes and hypertension are likely contributing factors. She is not taking NSAIDs like Aleve or Motrin. We will recheck labs next visit   There is a history of small vessel ischemic changes in the brain, which could be related to migraines or other vascular issues. Diabetes and high cholesterol may contribute to these changes. She is taking rosuvastatin for cholesterol  management. BP is under control   She has a history of tension headaches, for which she has used Flexeril and Fioricet. She has been advised to stop baclofen as she is already on a muscle relaxer.        Patient Active Problem List   Diagnosis Date Noted   Eosinophilic asthma 01/18/2024   Muscle spasms of both lower extremities 01/18/2024   Chronic left shoulder pain 11/09/2022   Hyperlipidemia 01/14/2021   Incomplete tear of left rotator cuff 07/03/2020   Angioedema due to angiotensin converting enzyme inhibitor (ACE-I) 01/29/2020   Chronic bilateral back pain 07/26/2017   Coronary artery calcification 05/17/2017   Heart palpitations 05/15/2017   History of acute gastritis    Leukocytosis 09/30/2016   Atherosclerosis of abdominal aorta (HCC) 08/18/2016   Type 2 diabetes mellitus with stage 3 chronic kidney disease, without long-term current use of insulin (HCC) 12/16/2015   Diabetic neuropathy associated with type 2 diabetes mellitus (HCC) 09/18/2015   Insomnia 09/18/2015   Benign essential HTN 06/18/2015   Chronic kidney disease (CKD), stage III (moderate) (HCC) 06/18/2015   Diabetes mellitus with renal manifestation (HCC) 06/18/2015   Elevated CK 06/18/2015   Obesity (BMI 30.0-34.9) 06/18/2015   Degenerative arthritis of hip 06/18/2015   Sickle cell trait (HCC) 06/18/2015   Dyslipidemia 05/27/2010   Gastroesophageal reflux disease 05/25/2008    Social History   Tobacco Use   Smoking status: Never   Smokeless tobacco: Never  Substance Use Topics   Alcohol use: Yes    Alcohol/week: 0.0 standard drinks  of alcohol    Comment: rare     Current Outpatient Medications:    albuterol (VENTOLIN HFA) 108 (90 Base) MCG/ACT inhaler, SMARTSIG:2 Puff(s) By Mouth Every 4-6 Hours PRN, Disp: , Rfl:    aspirin (ASPIRIN 81) 81 MG chewable tablet, Chew 1 tablet (81 mg total) by mouth daily., Disp: 30 tablet, Rfl: 0   baclofen (LIORESAL) 10 MG tablet, Take 1 tablet (10 mg total) by  mouth 3 (three) times daily., Disp: 90 each, Rfl: 0   Blood Glucose Monitoring Suppl (CONTOUR NEXT ONE) KIT, USE TO TEST BLOOD SUGAR ONCE D, Disp: , Rfl:    Budeson-Glycopyrrol-Formoterol (BREZTRI AEROSPHERE) 160-9-4.8 MCG/ACT AERO, Inhale 2 puffs into the lungs in the morning and at bedtime., Disp: , Rfl:    Cholecalciferol (VITAMIN D) 2000 units CAPS, Take 1 capsule (2,000 Units total) by mouth daily., Disp: 30 capsule, Rfl: 0   Continuous Glucose Sensor (FREESTYLE LIBRE 3 SENSOR) MISC, 1 Device by Does not apply route every 14 (fourteen) days., Disp: 6 each, Rfl: 1   cyclobenzaprine (FLEXERIL) 5 MG tablet, Take 5 mg by mouth 3 (three) times daily as needed., Disp: , Rfl:    diclofenac Sodium (VOLTAREN) 1 % GEL, Apply 2 g topically 4 (four) times daily., Disp: 100 g, Rfl: 2   diltiazem (CARDIZEM CD) 180 MG 24 hr capsule, Take 1 capsule (180 mg total) by mouth daily., Disp: 90 capsule, Rfl: 1   empagliflozin (JARDIANCE) 25 MG TABS tablet, Take 1 tablet (25 mg total) by mouth daily before breakfast., Disp: 90 tablet, Rfl: 0   ezetimibe (ZETIA) 10 MG tablet, Take 1 tablet (10 mg total) by mouth daily., Disp: 90 tablet, Rfl: 3   famotidine (PEPCID) 20 MG tablet, Take 1 tablet (20 mg total) by mouth 2 (two) times daily., Disp: 180 tablet, Rfl: 1   gabapentin (NEURONTIN) 300 MG capsule, Take 1 capsule (300 mg total) by mouth 3 (three) times daily., Disp: 270 capsule, Rfl: 1   glucose blood test strip, Use as instructed, Disp: 100 each, Rfl: 12   ipratropium-albuterol (DUONEB) 0.5-2.5 (3) MG/3ML SOLN, Take 3 mLs by nebulization every 6 (six) hours as needed., Disp: 360 mL, Rfl: 1   loratadine (CLARITIN) 10 MG tablet, Take by mouth., Disp: , Rfl:    magnesium oxide (MAG-OX) 400 MG tablet, Take 1 tablet (400 mg total) by mouth 2 (two) times daily., Disp: 180 tablet, Rfl: 1   meclizine (ANTIVERT) 25 MG tablet, Take 1 tablet (25 mg total) by mouth 3 (three) times daily as needed for dizziness., Disp: 30  tablet, Rfl: 0   metoprolol succinate (TOPROL-XL) 25 MG 24 hr tablet, Take by mouth., Disp: , Rfl:    Microlet Lancets MISC, USE TO CHECK BLOOD SUGAR ONCE D, Disp: , Rfl:    nystatin (MYCOSTATIN) 100000 UNIT/ML suspension, Take by mouth., Disp: , Rfl:    rosuvastatin (CRESTOR) 40 MG tablet, TAKE 1 TABLET(40 MG) BY MOUTH DAILY, Disp: 90 tablet, Rfl: 3   Semaglutide,0.25 or 0.5MG /DOS, (OZEMPIC, 0.25 OR 0.5 MG/DOSE,) 2 MG/3ML SOPN, Inject 0.5 mg into the skin once a week., Disp: 6 mL, Rfl: 0   traZODone (DESYREL) 50 MG tablet, TAKE 1 TABLET(50 MG) BY MOUTH AT BEDTIME, Disp: 90 tablet, Rfl: 0   triamcinolone cream (KENALOG) 0.1 %, Apply 1 Application topically 2 (two) times daily., Disp: 453.6 g, Rfl: 0  Allergies  Allergen Reactions   Ace Inhibitors Swelling    Angioedema    Lisinopril Swelling    Face  and neck swelling    ROS  Ten systems reviewed and is negative except as mentioned in HPI    Objective  Vitals:   01/26/24 1256  BP: 134/80  Pulse: (!) 107  Resp: 16  SpO2: 94%  Weight: 173 lb 1.6 oz (78.5 kg)  Height: 5\' 4"  (1.626 m)    Body mass index is 29.71 kg/m.    Physical Exam  Constitutional: Patient appears well-developed and well-nourished. Obese  No distress.  HEENT: head atraumatic, normocephalic, pupils equal and reactive to light, , neck supple Cardiovascular: Normal rate, regular rhythm and normal heart sounds.  No murmur heard. No BLE edema. Pulmonary/Chest: Effort normal and breath sounds normal. No respiratory distress. Abdominal: Soft.  There is no tenderness. Neuro: no focal findings  Psychiatric: Patient has a normal mood and affect. behavior is normal. Judgment and thought content normal.   Recent Results (from the past 2160 hours)  CBC with Differential/Platelet     Status: Abnormal   Collection Time: 11/18/23 10:09 AM  Result Value Ref Range   WBC 10.8 3.8 - 10.8 Thousand/uL   RBC 4.91 3.80 - 5.10 Million/uL   Hemoglobin 13.2 11.7 - 15.5 g/dL    HCT 09.8 11.9 - 14.7 %   MCV 83.7 80.0 - 100.0 fL   MCH 26.9 (L) 27.0 - 33.0 pg   MCHC 32.1 32.0 - 36.0 g/dL    Comment: For adults, a slight decrease in the calculated MCHC value (in the range of 30 to 32 g/dL) is most likely not clinically significant; however, it should be interpreted with caution in correlation with other red cell parameters and the patient's clinical condition.    RDW 15.2 (H) 11.0 - 15.0 %   Platelets 241 140 - 400 Thousand/uL   MPV 11.2 7.5 - 12.5 fL   Neutro Abs 6,674 1,500 - 7,800 cells/uL   Absolute Lymphocytes 2,743 850 - 3,900 cells/uL   Absolute Monocytes 713 200 - 950 cells/uL   Eosinophils Absolute 583 (H) 15 - 500 cells/uL   Basophils Absolute 86 0 - 200 cells/uL   Neutrophils Relative % 61.8 %   Total Lymphocyte 25.4 %   Monocytes Relative 6.6 %   Eosinophils Relative 5.4 %   Basophils Relative 0.8 %  COMPLETE METABOLIC PANEL WITH GFR     Status: Abnormal   Collection Time: 11/18/23 10:09 AM  Result Value Ref Range   Glucose, Bld 81 65 - 99 mg/dL    Comment: .            Fasting reference interval .    BUN 11 7 - 25 mg/dL   Creat 8.29 (H) 5.62 - 1.05 mg/dL   eGFR 51 (L) > OR = 60 mL/min/1.41m2   BUN/Creatinine Ratio 9 6 - 22 (calc)   Sodium 141 135 - 146 mmol/L   Potassium 3.9 3.5 - 5.3 mmol/L   Chloride 104 98 - 110 mmol/L   CO2 27 20 - 32 mmol/L   Calcium 9.2 8.6 - 10.4 mg/dL   Total Protein 7.4 6.1 - 8.1 g/dL   Albumin 4.1 3.6 - 5.1 g/dL   Globulin 3.3 1.9 - 3.7 g/dL (calc)   AG Ratio 1.2 1.0 - 2.5 (calc)   Total Bilirubin 0.3 0.2 - 1.2 mg/dL   Alkaline phosphatase (APISO) 118 37 - 153 U/L   AST 18 10 - 35 U/L   ALT 12 6 - 29 U/L  Nitric oxide     Status: None   Collection  Time: 12/16/23  4:09 PM  Result Value Ref Range   Nitric Oxide 163   POCT Glucose (CBG)     Status: Abnormal   Collection Time: 01/14/24  9:56 AM  Result Value Ref Range   POC Glucose 162 (A) 70 - 99 mg/dl  Lipid panel     Status: Abnormal    Collection Time: 01/18/24 12:15 PM  Result Value Ref Range   Cholesterol 179 <200 mg/dL   HDL 92 > OR = 50 mg/dL   Triglycerides 782 (H) <150 mg/dL    Comment: . If a non-fasting specimen was collected, consider repeat triglyceride testing on a fasting specimen if clinically indicated.  Perry Mount et al. J. of Clin. Lipidol. 2015;9:129-169. Marland Kitchen    LDL Cholesterol (Calc) 60 mg/dL (calc)    Comment: Reference range: <100 . Desirable range <100 mg/dL for primary prevention;   <70 mg/dL for patients with CHD or diabetic patients  with > or = 2 CHD risk factors. Marland Kitchen LDL-C is now calculated using the Martin-Hopkins  calculation, which is a validated novel method providing  better accuracy than the Friedewald equation in the  estimation of LDL-C.  Horald Pollen et al. Lenox Ahr. 9562;130(86): 2061-2068  (http://education.[facility].com/faq/FAQ164)    Total CHOL/HDL Ratio 1.9 <5.0 (calc)   Non-HDL Cholesterol (Calc) 87 <578 mg/dL (calc)    Comment: For patients with diabetes plus 1 major ASCVD risk  factor, treating to a non-HDL-C goal of <100 mg/dL  (LDL-C of <46 mg/dL) is considered a therapeutic  option.   Microalbumin / creatinine urine ratio     Status: Abnormal   Collection Time: 01/18/24 12:15 PM  Result Value Ref Range   Creatinine, Urine 135 20 - 275 mg/dL   Microalb, Ur 9.6 mg/dL    Comment: Reference Range Not established    Microalb Creat Ratio 71 (H) <30 mg/g creat    Comment: . The ADA defines abnormalities in albumin excretion as follows: Marland Kitchen Albuminuria Category        Result (mg/g creatinine) . Normal to Mildly increased   <30 Moderately increased         30-299  Severely increased           > OR = 300 . The ADA recommends that at least two of three specimens collected within a 3-6 month period be abnormal before considering a patient to be within a diagnostic category.   CBC with Differential/Platelet     Status: Abnormal   Collection Time: 01/18/24 12:15 PM   Result Value Ref Range   WBC 10.7 3.8 - 10.8 Thousand/uL   RBC 4.96 3.80 - 5.10 Million/uL   Hemoglobin 13.7 11.7 - 15.5 g/dL   HCT 96.2 95.2 - 84.1 %   MCV 84.5 80.0 - 100.0 fL   MCH 27.6 27.0 - 33.0 pg   MCHC 32.7 32.0 - 36.0 g/dL    Comment: For adults, a slight decrease in the calculated MCHC value (in the range of 30 to 32 g/dL) is most likely not clinically significant; however, it should be interpreted with caution in correlation with other red cell parameters and the patient's clinical condition.    RDW 15.0 11.0 - 15.0 %   Platelets 138 (L) 140 - 400 Thousand/uL   MPV 11.3 7.5 - 12.5 fL   Neutro Abs 7,404 1,500 - 7,800 cells/uL   Absolute Lymphocytes 2,279 850 - 3,900 cells/uL   Absolute Monocytes 610 200 - 950 cells/uL   Eosinophils Absolute 342 15 - 500  cells/uL   Basophils Absolute 64 0 - 200 cells/uL   Neutrophils Relative % 69.2 %   Total Lymphocyte 21.3 %   Monocytes Relative 5.7 %   Eosinophils Relative 3.2 %   Basophils Relative 0.6 %   Smear Review      Comment: Review of peripheral smear confirms automated results.   COMPLETE METABOLIC PANEL WITH GFR     Status: Abnormal   Collection Time: 01/18/24 12:15 PM  Result Value Ref Range   Glucose, Bld 189 (H) 65 - 99 mg/dL    Comment: .            Fasting reference interval . For someone without known diabetes, a glucose value >125 mg/dL indicates that they may have diabetes and this should be confirmed with a follow-up test. .    BUN 15 7 - 25 mg/dL   Creat 4.09 (H) 8.11 - 1.05 mg/dL   eGFR 41 (L) > OR = 60 mL/min/1.34m2   BUN/Creatinine Ratio 10 6 - 22 (calc)   Sodium 139 135 - 146 mmol/L   Potassium 3.7 3.5 - 5.3 mmol/L   Chloride 103 98 - 110 mmol/L   CO2 26 20 - 32 mmol/L   Calcium 9.2 8.6 - 10.4 mg/dL   Total Protein 6.9 6.1 - 8.1 g/dL   Albumin 4.1 3.6 - 5.1 g/dL   Globulin 2.8 1.9 - 3.7 g/dL (calc)   AG Ratio 1.5 1.0 - 2.5 (calc)   Total Bilirubin 0.5 0.2 - 1.2 mg/dL   Alkaline  phosphatase (APISO) 121 37 - 153 U/L   AST 27 10 - 35 U/L   ALT 42 (H) 6 - 29 U/L  CK (Creatine Kinase)     Status: None   Collection Time: 01/18/24 12:15 PM  Result Value Ref Range   Total CK 127 20 - 243 U/L     Assessment and Plan    Intractable Migraine Chronic, continuous headache for the past 5-6 days with sharp pain, light and sound sensitivity. No current preventive medication for migraines. -Start Bernita Raisin as needed for acute symptoms (pending prior authorization). -Start Qulipta 30mg  daily for prevention (pending prior authorization). -Continue Fioricet for tension headaches. -Administer Toradol 60mg  and Promethazine today for immediate relief.  Chronic Kidney Disease (CKD) Stage 3A Significant drop in GFR from 46 to 36 within a month. History of hypertension and diabetes. -Recheck kidney function in one week. -Advise patient to stay hydrated.  Reactive Airway Disease Wheezing reported by the patient, currently on Breztri inhaler. -Continue current inhaler and maintain follow-up with pulmonologist.  Small Vessel Ischemic Disease of the Brain Detected on recent CT scan, likely due to history of hypertension, diabetes, and high cholesterol. -Continue Rosuvastatin for cholesterol control. -Advise patient to maintain control of blood pressure and diabetes, eat healthily, and stay physically active.  Follow-up appointment scheduled for February 21, 2024.

## 2024-01-27 ENCOUNTER — Encounter: Payer: Self-pay | Admitting: Nurse Practitioner

## 2024-01-27 ENCOUNTER — Other Ambulatory Visit: Payer: Self-pay

## 2024-01-27 ENCOUNTER — Ambulatory Visit: Payer: Self-pay

## 2024-01-27 ENCOUNTER — Telehealth: Payer: Medicare HMO | Admitting: Nurse Practitioner

## 2024-01-27 DIAGNOSIS — R112 Nausea with vomiting, unspecified: Secondary | ICD-10-CM

## 2024-01-27 DIAGNOSIS — I422 Other hypertrophic cardiomyopathy: Secondary | ICD-10-CM | POA: Diagnosis not present

## 2024-01-27 DIAGNOSIS — K529 Noninfective gastroenteritis and colitis, unspecified: Secondary | ICD-10-CM | POA: Diagnosis not present

## 2024-01-27 DIAGNOSIS — I471 Supraventricular tachycardia, unspecified: Secondary | ICD-10-CM | POA: Diagnosis not present

## 2024-01-27 MED ORDER — ONDANSETRON 4 MG PO TBDP: 4.0000 mg | ORAL_TABLET | Freq: Three times a day (TID) | ORAL | 0 refills | Status: DC | PRN

## 2024-01-27 MED ORDER — PROMETHAZINE HCL 25 MG PO TABS: 25.0000 mg | ORAL_TABLET | Freq: Three times a day (TID) | ORAL | 0 refills | Status: DC | PRN

## 2024-01-27 NOTE — Telephone Encounter (Signed)
  Chief Complaint: Vomiting 5x since 1:30 am - some "pink" in vomit Symptoms: above Frequency: this morning Pertinent Negatives: Patient denies  Disposition: [] ED /[] Urgent Care (no appt availability in office) / [x] Appointment(In office/virtual)/ []  [facility]/ [] Home Care/ [] Refused Recommended Disposition /[] [facility]/ []  Follow-up with PCP Additional Notes: Pt was seen in office yesterday and given 2 shots. Pt started vomiting at 1:30 this morning. Her stomach also hurts.  Appt made for this morning.    Reason for Disposition  [1] Constant abdominal pain AND [2] present > 2 hours  Answer Assessment - Initial Assessment Questions 1. VOMITING SEVERITY: "How many times have you vomited in the past 24 hours?"     - MILD:  1 - 2 times/day    - MODERATE: 3 - 5 times/day, decreased oral intake without significant weight loss or symptoms of dehydration    - SEVERE: 6 or more times/day, vomits everything or nearly everything, with significant weight loss, symptoms of dehydration      5 2. ONSET: "When did the vomiting begin?"      Early this morning 3. FLUIDS: "What fluids or food have you vomited up today?" "Have you been able to keep any fluids down?"     Some 7 up 4. ABDOMEN PAIN: "Are your having any abdomen pain?" If Yes : "How bad is it and what does it feel like?" (e.g., crampy, dull, intermittent, constant)      yes 5. DIARRHEA: "Is there any diarrhea?" If Yes, ask: "How many times today?"      no 7. CAUSE: "What do you think is causing your vomiting?"     Injections for yesterday  9. OTHER SYMPTOMS: "Do you have any other symptoms?" (e.g., fever, headache, vertigo, vomiting blood or coffee grounds, recent head injury)     Pink in vomit  Protocols used: Vomiting-A-AH

## 2024-01-27 NOTE — Progress Notes (Signed)
Name: Brandy Johnston   MRN: [id number]    DOB: 01/23/1961   Date:01/27/2024       Progress Note  Subjective  Chief Complaint  Chief Complaint  Patient presents with   Emesis    Since this morning    I connected with  Brandy Johnston  on 01/27/24 at 11:20 AM EST by a video enabled telemedicine application and verified that I am speaking with the correct person using two identifiers.  I discussed the limitations of evaluation and management by telemedicine and the availability of in person appointments. The patient expressed understanding and agreed to proceed with a virtual visit  Staff also discussed with the patient that there may be a patient responsible charge related to this service. Patient Location: home Provider Location: [facility] Additional Individuals present: alone  HPI  Discussed the use of AI scribe software for clinical note transcription with the patient, who gave verbal consent to proceed.  History of Present Illness   The patient presents with acute onset vomiting, starting at 1:30 AM. She reports that she is unable to keep even water down and has vomited to the point of producing 'yellow thick stuff.' She denies fever and diarrhea. She reports abdominal pain, described as a 'crampy feeling,' likely secondary to the vomiting. She denies recent exposure to sick contacts. She ate [place] wonton soup the night before the onset of symptoms but denies any unusual intake. The patient had a headache the previous day, which has since resolved.    Patient Active Problem List   Diagnosis Date Noted   Small vessel disease (HCC) 01/26/2024   Eosinophilic asthma 01/18/2024   Muscle spasms of both lower extremities 01/18/2024   Chronic left shoulder pain 11/09/2022   Hyperlipidemia 01/14/2021   Incomplete tear of left rotator cuff 07/03/2020   Angioedema due to angiotensin converting enzyme inhibitor (ACE-I) 01/29/2020   Chronic bilateral back pain 07/26/2017   Coronary artery  calcification 05/17/2017   Heart palpitations 05/15/2017   History of acute gastritis    Leukocytosis 09/30/2016   Atherosclerosis of abdominal aorta (HCC) 08/18/2016   Type 2 diabetes mellitus with stage 3 chronic kidney disease, without long-term current use of insulin (HCC) 12/16/2015   Diabetic neuropathy associated with type 2 diabetes mellitus (HCC) 09/18/2015   Insomnia 09/18/2015   Benign essential HTN 06/18/2015   Chronic kidney disease (CKD), stage III (moderate) (HCC) 06/18/2015   Diabetes mellitus with renal manifestation (HCC) 06/18/2015   Elevated CK 06/18/2015   Obesity (BMI 30.0-34.9) 06/18/2015   Degenerative arthritis of hip 06/18/2015   Sickle cell trait (HCC) 06/18/2015   Dyslipidemia 05/27/2010   Gastroesophageal reflux disease 05/25/2008    Social History   Tobacco Use   Smoking status: Never   Smokeless tobacco: Never  Substance Use Topics   Alcohol use: Yes    Alcohol/week: 0.0 standard drinks of alcohol    Comment: rare     Current Outpatient Medications:    albuterol (VENTOLIN HFA) 108 (90 Base) MCG/ACT inhaler, SMARTSIG:2 Puff(s) By Mouth Every 4-6 Hours PRN, Disp: , Rfl:    aspirin (ASPIRIN 81) 81 MG chewable tablet, Chew 1 tablet (81 mg total) by mouth daily., Disp: 30 tablet, Rfl: 0   Atogepant (QULIPTA) 30 MG TABS, Take 1 tablet (30 mg total) by mouth daily at 12 noon., Disp: 30 tablet, Rfl: 2   Blood Glucose Monitoring Suppl (CONTOUR NEXT ONE) KIT, USE TO TEST BLOOD SUGAR ONCE D, Disp: , Rfl:  Budeson-Glycopyrrol-Formoterol (BREZTRI AEROSPHERE) 160-9-4.8 MCG/ACT AERO, Inhale 2 puffs into the lungs in the morning and at bedtime., Disp: , Rfl:    butalbital-acetaminophen-caffeine (FIORICET) 50-325-40 MG tablet, Take 1 tablet by mouth every 6 (six) hours as needed for headache., Disp: 20 tablet, Rfl: 0   Cholecalciferol (VITAMIN D) 2000 units CAPS, Take 1 capsule (2,000 Units total) by mouth daily., Disp: 30 capsule, Rfl: 0   Continuous Glucose  Sensor (FREESTYLE LIBRE 3 SENSOR) MISC, 1 Device by Does not apply route every 14 (fourteen) days., Disp: 6 each, Rfl: 1   cyclobenzaprine (FLEXERIL) 5 MG tablet, Take 5 mg by mouth 3 (three) times daily as needed., Disp: , Rfl:    diclofenac Sodium (VOLTAREN) 1 % GEL, Apply 2 g topically 4 (four) times daily., Disp: 100 g, Rfl: 2   diltiazem (CARDIZEM CD) 180 MG 24 hr capsule, Take 1 capsule (180 mg total) by mouth daily., Disp: 90 capsule, Rfl: 1   empagliflozin (JARDIANCE) 25 MG TABS tablet, Take 1 tablet (25 mg total) by mouth daily before breakfast., Disp: 90 tablet, Rfl: 0   ezetimibe (ZETIA) 10 MG tablet, Take 1 tablet (10 mg total) by mouth daily., Disp: 90 tablet, Rfl: 3   famotidine (PEPCID) 20 MG tablet, Take 1 tablet (20 mg total) by mouth 2 (two) times daily., Disp: 180 tablet, Rfl: 1   gabapentin (NEURONTIN) 300 MG capsule, Take 1 capsule (300 mg total) by mouth 3 (three) times daily., Disp: 270 capsule, Rfl: 1   glucose blood test strip, Use as instructed, Disp: 100 each, Rfl: 12   ipratropium-albuterol (DUONEB) 0.5-2.5 (3) MG/3ML SOLN, Take 3 mLs by nebulization every 6 (six) hours as needed., Disp: 360 mL, Rfl: 1   loratadine (CLARITIN) 10 MG tablet, Take 1 tablet (10 mg total) by mouth daily., Disp: 90 tablet, Rfl: 1   magnesium oxide (MAG-OX) 400 MG tablet, Take 1 tablet (400 mg total) by mouth 2 (two) times daily., Disp: 180 tablet, Rfl: 1   meclizine (ANTIVERT) 25 MG tablet, Take 1 tablet (25 mg total) by mouth 3 (three) times daily as needed for dizziness., Disp: 30 tablet, Rfl: 0   metoprolol succinate (TOPROL-XL) 25 MG 24 hr tablet, Take by mouth., Disp: , Rfl:    Microlet Lancets MISC, USE TO CHECK BLOOD SUGAR ONCE D, Disp: , Rfl:    nystatin (MYCOSTATIN) 100000 UNIT/ML suspension, Take by mouth., Disp: , Rfl:    ondansetron (ZOFRAN-ODT) 4 MG disintegrating tablet, Take 1 tablet (4 mg total) by mouth every 8 (eight) hours as needed for nausea., Disp: 30 tablet, Rfl: 0    promethazine (PHENERGAN) 25 MG tablet, Take 1 tablet (25 mg total) by mouth every 8 (eight) hours as needed for nausea or vomiting., Disp: 20 tablet, Rfl: 0   rosuvastatin (CRESTOR) 40 MG tablet, TAKE 1 TABLET(40 MG) BY MOUTH DAILY, Disp: 90 tablet, Rfl: 3   Semaglutide,0.25 or 0.5MG /DOS, (OZEMPIC, 0.25 OR 0.5 MG/DOSE,) 2 MG/3ML SOPN, Inject 0.5 mg into the skin once a week., Disp: 6 mL, Rfl: 0   traZODone (DESYREL) 50 MG tablet, TAKE 1 TABLET(50 MG) BY MOUTH AT BEDTIME, Disp: 90 tablet, Rfl: 0   triamcinolone cream (KENALOG) 0.1 %, Apply 1 Application topically 2 (two) times daily., Disp: 453.6 g, Rfl: 0   Ubrogepant (UBRELVY) 100 MG TABS, Take 1 tablet (100 mg total) by mouth daily as needed., Disp: 16 tablet, Rfl: 0  Current Facility-Administered Medications:    promethazine (PHENERGAN) injection 25 mg, 25 mg, Intramuscular, Q6H  PRN, , 25 mg at 01/26/24 1348  Allergies  Allergen Reactions   Ace Inhibitors Swelling    Angioedema    Lisinopril Swelling    Face and neck swelling    I personally reviewed active problem list, medication list, allergies, notes from last encounter with the patient/caregiver today.  ROS  Ten systems reviewed and is negative except as mentioned in HPI   Objective  Virtual encounter, vitals not obtained.  There is no height or weight on file to calculate BMI.  Nursing Note and Vital Signs reviewed.  Physical Exam  Awake, alert and oriented, speaking in complete sentences   No results found for this or any previous visit (from the past 72 hours).  Assessment & Plan     Acute Gastroenteritis Sudden onset of vomiting, unable to keep down fluids, with associated crampy abdominal pain. No fever or diarrhea. Likely viral gastroenteritis given the current community prevalence. -Prescribe Zofran and Phenergan for nausea. -Advise bland diet, starting with clear liquids and progressing as tolerated. -If unable to tolerate fluids despite antiemetics,  patient should present to the emergency department for risk of dehydration.         -Red flags and when to present for emergency care or RTC including fever >101.77F, chest pain, shortness of breath, new/worsening/un-resolving symptoms,  reviewed with patient at time of visit. Follow up and care instructions discussed and provided in AVS. - I discussed the assessment and treatment plan with the patient. The patient was provided an opportunity to ask questions and all were answered. The patient agreed with the plan and demonstrated an understanding of the instructions.  I provided 15 minutes of non-face-to-face time during this encounter.  Berniece Salines, FNP

## 2024-02-01 NOTE — Telephone Encounter (Signed)
Copied from CRM [id number]. Topic: General - Other >> Feb 01, 2024  8:50 AM Dondra Prader E wrote: Reason for CRM: Pt wants to know if she needs to fast for her lab work, says she cannot come today but she plans to come tomorrow. Please advise.

## 2024-02-01 NOTE — Telephone Encounter (Signed)
Pt notified to come in fasting preferably between 8am-11:30am.

## 2024-02-02 ENCOUNTER — Other Ambulatory Visit: Payer: Self-pay

## 2024-02-02 DIAGNOSIS — R944 Abnormal results of kidney function studies: Secondary | ICD-10-CM | POA: Diagnosis not present

## 2024-02-02 DIAGNOSIS — I129 Hypertensive chronic kidney disease with stage 1 through stage 4 chronic kidney disease, or unspecified chronic kidney disease: Secondary | ICD-10-CM | POA: Diagnosis not present

## 2024-02-02 DIAGNOSIS — N183 Chronic kidney disease, stage 3 unspecified: Secondary | ICD-10-CM | POA: Diagnosis not present

## 2024-02-02 DIAGNOSIS — E1122 Type 2 diabetes mellitus with diabetic chronic kidney disease: Secondary | ICD-10-CM | POA: Diagnosis not present

## 2024-02-03 ENCOUNTER — Encounter: Payer: Self-pay | Admitting: Family Medicine

## 2024-02-03 ENCOUNTER — Ambulatory Visit: Payer: Self-pay

## 2024-02-03 DIAGNOSIS — J4551 Severe persistent asthma with (acute) exacerbation: Secondary | ICD-10-CM | POA: Diagnosis not present

## 2024-02-03 DIAGNOSIS — Z9181 History of falling: Secondary | ICD-10-CM | POA: Diagnosis not present

## 2024-02-03 DIAGNOSIS — M6281 Muscle weakness (generalized): Secondary | ICD-10-CM | POA: Diagnosis not present

## 2024-02-03 DIAGNOSIS — R262 Difficulty in walking, not elsewhere classified: Secondary | ICD-10-CM | POA: Diagnosis not present

## 2024-02-03 LAB — BASIC METABOLIC PANEL WITH GFR
BUN/Creatinine Ratio: 4 (calc) — ABNORMAL LOW (ref 6–22)
BUN: 5 mg/dL — ABNORMAL LOW (ref 7–25)
CO2: 28 mmol/L (ref 20–32)
Calcium: 9.3 mg/dL (ref 8.6–10.4)
Chloride: 106 mmol/L (ref 98–110)
Creat: 1.39 mg/dL — ABNORMAL HIGH (ref 0.50–1.05)
Glucose, Bld: 87 mg/dL (ref 65–99)
Potassium: 3.8 mmol/L (ref 3.5–5.3)
Sodium: 143 mmol/L (ref 135–146)
eGFR: 42 mL/min/{1.73_m2} — ABNORMAL LOW (ref >=60)

## 2024-02-03 NOTE — Telephone Encounter (Signed)
 Chief Complaint: cyclobenzaprine -is it ok to take along with the fioricet for the headache I'm still having Symptoms: headache Frequency: cyclobenzaprine  started on 01/25/24 TID as needed Pertinent Negatives: Patient denies other symptoms Disposition: [] ED /[] Urgent Care (no appt availability in office) / [] Appointment(In office/virtual)/ []  [facility]/ [] Home Care/ [] Refused Recommended Disposition /[]  Mobile Bus/ [x]  Follow-up with PCP Additional Notes: Patient says she was given cyclobenzaprine  when she went to the ED to take for the headaches. She says that provider told her that it would relax the muscles in her body which would also help the headache. She says she wants to know if this is something Dr. Glenard wants her to continue and if so she will need a prescription sent to [facility] in [place]. Advised I will send this to Dr. Glenard and someone will call with her recommendation regarding this. Patient verbalized understanding.     Summary: rx question   Pt seen in hospital and given Cyclobenzaprine  5mg  1 tab by mouth 3x a day. Pt wanting to know if Dr. Glenard would like for patient to stay on medication. Pt states she was on it before with Dr. Glenard.  Please assist pt further     Reason for Disposition . [1] Caller has medicine question about med NOT prescribed by PCP AND [2] triager unable to answer question (e.g., compatibility with other med, storage)  Answer Assessment - Initial Assessment Questions 1. NAME of MEDICINE: What medicine(s) are you calling about?     cyclobenzaprine  2. QUESTION: What is your question? (e.g., double dose of medicine, side effect)     Should I take with the fioricet for my headache 3. PRESCRIBER: Who prescribed the medicine? Reason: if prescribed by specialist, call should be referred to that group.     [facility] provider at [facility] 4. SYMPTOMS: Do you have any symptoms? If Yes, ask: What symptoms are you having?  How  bad are the symptoms (e.g., mild, moderate, severe)     Headache ongoing  Protocols used: Medication Question Call-A-AH

## 2024-02-04 ENCOUNTER — Telehealth (INDEPENDENT_AMBULATORY_CARE_PROVIDER_SITE_OTHER): Payer: Medicare HMO | Admitting: Family Medicine

## 2024-02-04 ENCOUNTER — Other Ambulatory Visit: Payer: Self-pay | Admitting: Family Medicine

## 2024-02-04 ENCOUNTER — Encounter: Payer: Self-pay | Admitting: Family Medicine

## 2024-02-04 DIAGNOSIS — G43011 Migraine without aura, intractable, with status migrainosus: Secondary | ICD-10-CM

## 2024-02-04 DIAGNOSIS — K219 Gastro-esophageal reflux disease without esophagitis: Secondary | ICD-10-CM | POA: Diagnosis not present

## 2024-02-04 DIAGNOSIS — G44209 Tension-type headache, unspecified, not intractable: Secondary | ICD-10-CM

## 2024-02-04 DIAGNOSIS — I10 Essential (primary) hypertension: Secondary | ICD-10-CM | POA: Diagnosis not present

## 2024-02-04 DIAGNOSIS — E1142 Type 2 diabetes mellitus with diabetic polyneuropathy: Secondary | ICD-10-CM | POA: Diagnosis not present

## 2024-02-04 DIAGNOSIS — J9601 Acute respiratory failure with hypoxia: Secondary | ICD-10-CM | POA: Diagnosis not present

## 2024-02-04 DIAGNOSIS — I422 Other hypertrophic cardiomyopathy: Secondary | ICD-10-CM | POA: Diagnosis not present

## 2024-02-04 DIAGNOSIS — J4551 Severe persistent asthma with (acute) exacerbation: Secondary | ICD-10-CM | POA: Diagnosis not present

## 2024-02-04 DIAGNOSIS — E1165 Type 2 diabetes mellitus with hyperglycemia: Secondary | ICD-10-CM | POA: Diagnosis not present

## 2024-02-04 DIAGNOSIS — I739 Peripheral vascular disease, unspecified: Secondary | ICD-10-CM

## 2024-02-04 DIAGNOSIS — G43909 Migraine, unspecified, not intractable, without status migrainosus: Secondary | ICD-10-CM

## 2024-02-04 DIAGNOSIS — N183 Chronic kidney disease, stage 3 unspecified: Secondary | ICD-10-CM | POA: Diagnosis not present

## 2024-02-04 DIAGNOSIS — E1122 Type 2 diabetes mellitus with diabetic chronic kidney disease: Secondary | ICD-10-CM | POA: Diagnosis not present

## 2024-02-04 MED ORDER — CYCLOBENZAPRINE HCL 5 MG PO TABS: 5.0000 mg | ORAL_TABLET | Freq: Every day | ORAL | 0 refills | Status: DC

## 2024-02-04 NOTE — Telephone Encounter (Signed)
Reason for Disposition . [1] Caller has NON-URGENT medicine question about med that PCP prescribed AND [2] triager unable to answer question  Protocols used: Medication Question Call-A-AH  

## 2024-02-04 NOTE — Telephone Encounter (Signed)
 Pt notified verbalized understanding, pt mentioned headaches are not improving advised to make an appointment to discuss further.

## 2024-02-04 NOTE — Progress Notes (Signed)
 Name: Brandy Johnston   MRN: [id number]    DOB: 1961/12/04   Date:02/04/2024       Progress Note  Subjective  Chief Complaint  Chief Complaint  Patient presents with   Headache    Fioricet not helping with headaches    I connected with  Brandy Johnston  on 02/04/24 at 10:40 AM EST by a video enabled telemedicine application and verified that I am speaking with the correct person using two identifiers.  I discussed the limitations of evaluation and management by telemedicine and the availability of in person appointments. The patient expressed understanding and agreed to proceed with a virtual visit  Staff also discussed with the patient that there may be a patient responsible charge related to this service. Patient Location: at home Provider Location: [facility] Additional Individuals present: alone  Discussed the use of AI scribe software for clinical note transcription with the patient, who gave verbal consent to proceed.  History of Present Illness   Brandy Johnston is a 63 year old female with migraine headaches who presents with worsening headaches.  She experiences severe headaches that wake her from sleep, with the most recent episode occurring at 4:30 AM today. The pain worsens when she lifts her head and is rated as 6 to 7 out of 10.  She has a history of migraine and tension headaches. The current headaches are described as throbbing pain starting from the bottom of her neck, radiating to the back of her head on both sides, and sometimes extending to her ears and behind her eyes. She also experiences nausea and vomiting associated with these headaches.  In October, she underwent a CT scan for dizziness, and another CT scan was performed two weeks ago at [facility], which showed scattered hypodense foci within the periventricular and deep white matter, suggestive of small vessel ischemic changes.  Her current medications include Fioricet for tension headaches. She was prescribed Qulipta   for daily prevention and Ubrelvy  for acute migraine attacks, but she has not yet obtained these medications.   No other neuro symptoms        Patient Active Problem List   Diagnosis Date Noted   Small vessel disease (HCC) 01/26/2024   Eosinophilic asthma 01/18/2024   Muscle spasms of both lower extremities 01/18/2024   Chronic left shoulder pain 11/09/2022   Hyperlipidemia 01/14/2021   Incomplete tear of left rotator cuff 07/03/2020   Angioedema due to angiotensin converting enzyme inhibitor (ACE-I) 01/29/2020   Chronic bilateral back pain 07/26/2017   Coronary artery calcification 05/17/2017   Heart palpitations 05/15/2017   History of acute gastritis    Leukocytosis 09/30/2016   Atherosclerosis of abdominal aorta (HCC) 08/18/2016   Type 2 diabetes mellitus with stage 3 chronic kidney disease, without long-term current use of insulin  (HCC) 12/16/2015   Diabetic neuropathy associated with type 2 diabetes mellitus (HCC) 09/18/2015   Insomnia 09/18/2015   Benign essential HTN 06/18/2015   Chronic kidney disease (CKD), stage III (moderate) (HCC) 06/18/2015   Diabetes mellitus with renal manifestation (HCC) 06/18/2015   Elevated CK 06/18/2015   Obesity (BMI 30.0-34.9) 06/18/2015   Degenerative arthritis of hip 06/18/2015   Sickle cell trait (HCC) 06/18/2015   Dyslipidemia 05/27/2010   Gastroesophageal reflux disease 05/25/2008    Social History   Tobacco Use   Smoking status: Never   Smokeless tobacco: Never  Substance Use Topics   Alcohol use: Yes    Alcohol/week: 0.0 standard drinks of alcohol  Comment: rare     Current Outpatient Medications:    albuterol  (VENTOLIN  HFA) 108 (90 Base) MCG/ACT inhaler, SMARTSIG:2 Puff(s) By Mouth Every 4-6 Hours PRN, Disp: , Rfl:    aspirin  (ASPIRIN  81) 81 MG chewable tablet, Chew 1 tablet (81 mg total) by mouth daily., Disp: 30 tablet, Rfl: 0   Atogepant  (QULIPTA ) 30 MG TABS, Take 1 tablet (30 mg total) by mouth daily at 12 noon.,  Disp: 30 tablet, Rfl: 2   Blood Glucose Monitoring Suppl (CONTOUR NEXT ONE) KIT, USE TO TEST BLOOD SUGAR ONCE D, Disp: , Rfl:    Budeson-Glycopyrrol-Formoterol  (BREZTRI  AEROSPHERE) 160-9-4.8 MCG/ACT AERO, Inhale 2 puffs into the lungs in the morning and at bedtime., Disp: , Rfl:    butalbital -acetaminophen -caffeine  (FIORICET) 50-325-40 MG tablet, Take 1 tablet by mouth every 6 (six) hours as needed for headache., Disp: 20 tablet, Rfl: 0   Cholecalciferol (VITAMIN D ) 2000 units CAPS, Take 1 capsule (2,000 Units total) by mouth daily., Disp: 30 capsule, Rfl: 0   Continuous Glucose Sensor (FREESTYLE LIBRE 3 SENSOR) MISC, 1 Device by Does not apply route every 14 (fourteen) days., Disp: 6 each, Rfl: 1   cyclobenzaprine  (FLEXERIL ) 5 MG tablet, Take 1 tablet (5 mg total) by mouth at bedtime., Disp: 30 tablet, Rfl: 0   diclofenac  Sodium (VOLTAREN ) 1 % GEL, Apply 2 g topically 4 (four) times daily., Disp: 100 g, Rfl: 2   diltiazem  (CARDIZEM  CD) 180 MG 24 hr capsule, Take 1 capsule (180 mg total) by mouth daily., Disp: 90 capsule, Rfl: 1   empagliflozin  (JARDIANCE ) 25 MG TABS tablet, Take 1 tablet (25 mg total) by mouth daily before breakfast., Disp: 90 tablet, Rfl: 0   ezetimibe  (ZETIA ) 10 MG tablet, Take 1 tablet (10 mg total) by mouth daily., Disp: 90 tablet, Rfl: 3   famotidine  (PEPCID ) 20 MG tablet, Take 1 tablet (20 mg total) by mouth 2 (two) times daily., Disp: 180 tablet, Rfl: 1   gabapentin  (NEURONTIN ) 300 MG capsule, Take 1 capsule (300 mg total) by mouth 3 (three) times daily., Disp: 270 capsule, Rfl: 1   glucose blood test strip, Use as instructed, Disp: 100 each, Rfl: 12   ipratropium-albuterol  (DUONEB) 0.5-2.5 (3) MG/3ML SOLN, Take 3 mLs by nebulization every 6 (six) hours as needed., Disp: 360 mL, Rfl: 1   loratadine  (CLARITIN ) 10 MG tablet, Take 1 tablet (10 mg total) by mouth daily., Disp: 90 tablet, Rfl: 1   magnesium  oxide (MAG-OX) 400 MG tablet, Take 1 tablet (400 mg total) by mouth 2  (two) times daily., Disp: 180 tablet, Rfl: 1   meclizine  (ANTIVERT ) 25 MG tablet, Take 1 tablet (25 mg total) by mouth 3 (three) times daily as needed for dizziness., Disp: 30 tablet, Rfl: 0   metoprolol  succinate (TOPROL -XL) 25 MG 24 hr tablet, Take by mouth., Disp: , Rfl:    Microlet Lancets MISC, USE TO CHECK BLOOD SUGAR ONCE D, Disp: , Rfl:    nystatin  (MYCOSTATIN ) 100000 UNIT/ML suspension, Take by mouth., Disp: , Rfl:    ondansetron  (ZOFRAN -ODT) 4 MG disintegrating tablet, Take 1 tablet (4 mg total) by mouth every 8 (eight) hours as needed for nausea., Disp: 30 tablet, Rfl: 0   pantoprazole  (PROTONIX ) 40 MG tablet, Take 40 mg by mouth 2 (two) times daily., Disp: , Rfl:    promethazine  (PHENERGAN ) 25 MG tablet, Take 1 tablet (25 mg total) by mouth every 8 (eight) hours as needed for nausea or vomiting., Disp: 20 tablet, Rfl: 0   rosuvastatin  (  CRESTOR ) 40 MG tablet, TAKE 1 TABLET(40 MG) BY MOUTH DAILY, Disp: 90 tablet, Rfl: 3   Semaglutide ,0.25 or 0.5MG /DOS, (OZEMPIC , 0.25 OR 0.5 MG/DOSE,) 2 MG/3ML SOPN, Inject 0.5 mg into the skin once a week., Disp: 6 mL, Rfl: 0   traZODone  (DESYREL ) 50 MG tablet, TAKE 1 TABLET(50 MG) BY MOUTH AT BEDTIME, Disp: 90 tablet, Rfl: 0   triamcinolone  cream (KENALOG ) 0.1 %, Apply 1 Application topically 2 (two) times daily., Disp: 453.6 g, Rfl: 0   Ubrogepant  (UBRELVY ) 100 MG TABS, Take 1 tablet (100 mg total) by mouth daily as needed., Disp: 16 tablet, Rfl: 0  Current Facility-Administered Medications:    promethazine  (PHENERGAN ) injection 25 mg, 25 mg, Intramuscular, Q6H PRN, , 25 mg at 01/26/24 1348  Allergies  Allergen Reactions   Ace Inhibitors Swelling    Angioedema    Lisinopril  Swelling    Face and neck swelling    I personally reviewed active problem list, medication list, allergies with the patient/caregiver today.  ROS  Ten systems reviewed and is negative except as mentioned in HPI    Objective  Virtual encounter, vitals not  obtained.  There is no height or weight on file to calculate BMI.   Physical Exam  Awake, alert and oriented  Results for orders placed or performed in visit on 01/26/24 (from the past 72 hours)  BASIC METABOLIC PANEL WITH GFR     Status: Abnormal   Collection Time: 02/02/24  8:08 AM  Result Value Ref Range   Glucose, Bld 87 65 - 99 mg/dL    Comment: .            Fasting reference interval .    BUN 5 (L) 7 - 25 mg/dL    Comment: Verified by repeat analysis. .    Creat 1.39 (H) 0.50 - 1.05 mg/dL    Comment: Verified by repeat analysis. SABRA    eGFR 42 (L) > OR = 60 mL/min/1.68m2   BUN/Creatinine Ratio 4 (L) 6 - 22 (calc)   Sodium 143 135 - 146 mmol/L   Potassium 3.8 3.5 - 5.3 mmol/L   Chloride 106 98 - 110 mmol/L   CO2 28 20 - 32 mmol/L   Calcium  9.3 8.6 - 10.4 mg/dL    Assessment and Plan    Migraine Headaches Severe, throbbing headaches waking patient from sleep, associated with nausea and vomiting. Recent CT scan showed scattered hypodense foci within the periventricular and deep white matter, possibly related to small vessel disease or migraine history. Patient has not been taking prescribed Qulipta  and Ubrelvy . -Refer to neurologist for further evaluation. -Advise patient to pick up and start Qulipta  daily for migraine prevention. -Advise patient to pick up and use Ubrelvy  as needed for acute migraines. -Advise patient to avoid over-the-counter pain medications due to risk of rebound symptoms. -Advise patient to stay hydrated, take naps, and use cold compresses for symptomatic relief.  Tension Headaches Patient has a history of tension headaches, currently managed with Fioricet and Flexeril . Advised to only take Flexeril  at night due to sedating effects. -Continue Fioricet and Flexeril  as previously prescribed. -Check in with patient regarding effectiveness of current regimen.       -Red flags and when to present for emergency care or RTC including fever >101.84F,  chest pain, shortness of breath, new/worsening/un-resolving symptoms,  reviewed with patient at time of visit. Follow up and care instructions discussed and provided in AVS. - I discussed the assessment and treatment plan with the patient. The  patient was provided an opportunity to ask questions and all were answered. The patient agreed with the plan and demonstrated an understanding of the instructions.  I provided 25 minutes of non-face-to-face time during this encounter.  Harvy Riera F Romain Erion, MD

## 2024-02-07 ENCOUNTER — Telehealth: Payer: Self-pay | Admitting: Family Medicine

## 2024-02-07 DIAGNOSIS — I422 Other hypertrophic cardiomyopathy: Secondary | ICD-10-CM | POA: Diagnosis not present

## 2024-02-07 DIAGNOSIS — N183 Chronic kidney disease, stage 3 unspecified: Secondary | ICD-10-CM | POA: Diagnosis not present

## 2024-02-07 DIAGNOSIS — E1165 Type 2 diabetes mellitus with hyperglycemia: Secondary | ICD-10-CM | POA: Diagnosis not present

## 2024-02-07 DIAGNOSIS — J4551 Severe persistent asthma with (acute) exacerbation: Secondary | ICD-10-CM | POA: Diagnosis not present

## 2024-02-07 DIAGNOSIS — K219 Gastro-esophageal reflux disease without esophagitis: Secondary | ICD-10-CM | POA: Diagnosis not present

## 2024-02-07 DIAGNOSIS — E1122 Type 2 diabetes mellitus with diabetic chronic kidney disease: Secondary | ICD-10-CM | POA: Diagnosis not present

## 2024-02-07 DIAGNOSIS — E1142 Type 2 diabetes mellitus with diabetic polyneuropathy: Secondary | ICD-10-CM | POA: Diagnosis not present

## 2024-02-07 DIAGNOSIS — J9601 Acute respiratory failure with hypoxia: Secondary | ICD-10-CM | POA: Diagnosis not present

## 2024-02-07 DIAGNOSIS — I10 Essential (primary) hypertension: Secondary | ICD-10-CM | POA: Diagnosis not present

## 2024-02-07 NOTE — Telephone Encounter (Signed)
 Pt needs appt.

## 2024-02-07 NOTE — Telephone Encounter (Signed)
 Pt is calling to report that she was seen by Dr. Ava Lei on Friday via virtual appt. Today reporting green mucus, with slight cough.  [id number] [place], [place]  - [address] AT [facility] OF [address] [facility] Phone: [phone]  Fax: [phone]

## 2024-02-07 NOTE — Telephone Encounter (Signed)
 Appt sch'd with Dr Ava Lei for 02/09/24

## 2024-02-08 DIAGNOSIS — J310 Chronic rhinitis: Secondary | ICD-10-CM | POA: Diagnosis not present

## 2024-02-08 DIAGNOSIS — J4551 Severe persistent asthma with (acute) exacerbation: Secondary | ICD-10-CM | POA: Diagnosis not present

## 2024-02-09 ENCOUNTER — Encounter: Payer: Self-pay | Admitting: Family Medicine

## 2024-02-09 ENCOUNTER — Ambulatory Visit: Payer: Self-pay | Admitting: *Deleted

## 2024-02-09 ENCOUNTER — Ambulatory Visit: Payer: Medicare HMO | Admitting: Family Medicine

## 2024-02-09 ENCOUNTER — Telehealth: Payer: Medicare HMO | Admitting: Family Medicine

## 2024-02-09 DIAGNOSIS — R062 Wheezing: Secondary | ICD-10-CM

## 2024-02-09 DIAGNOSIS — G43011 Migraine without aura, intractable, with status migrainosus: Secondary | ICD-10-CM | POA: Diagnosis not present

## 2024-02-09 DIAGNOSIS — J8283 Eosinophilic asthma: Secondary | ICD-10-CM | POA: Diagnosis not present

## 2024-02-09 DIAGNOSIS — J320 Chronic maxillary sinusitis: Secondary | ICD-10-CM

## 2024-02-09 DIAGNOSIS — J9601 Acute respiratory failure with hypoxia: Secondary | ICD-10-CM | POA: Diagnosis not present

## 2024-02-09 DIAGNOSIS — I10 Essential (primary) hypertension: Secondary | ICD-10-CM | POA: Diagnosis not present

## 2024-02-09 DIAGNOSIS — K219 Gastro-esophageal reflux disease without esophagitis: Secondary | ICD-10-CM | POA: Diagnosis not present

## 2024-02-09 DIAGNOSIS — E1165 Type 2 diabetes mellitus with hyperglycemia: Secondary | ICD-10-CM | POA: Diagnosis not present

## 2024-02-09 DIAGNOSIS — N183 Chronic kidney disease, stage 3 unspecified: Secondary | ICD-10-CM | POA: Diagnosis not present

## 2024-02-09 DIAGNOSIS — E1142 Type 2 diabetes mellitus with diabetic polyneuropathy: Secondary | ICD-10-CM | POA: Diagnosis not present

## 2024-02-09 DIAGNOSIS — I422 Other hypertrophic cardiomyopathy: Secondary | ICD-10-CM | POA: Diagnosis not present

## 2024-02-09 DIAGNOSIS — E1122 Type 2 diabetes mellitus with diabetic chronic kidney disease: Secondary | ICD-10-CM | POA: Diagnosis not present

## 2024-02-09 DIAGNOSIS — J4551 Severe persistent asthma with (acute) exacerbation: Secondary | ICD-10-CM | POA: Diagnosis not present

## 2024-02-09 NOTE — Telephone Encounter (Signed)
Pt.notified

## 2024-02-09 NOTE — Telephone Encounter (Signed)
[facility] nurse- Judeth Cornfield is calling with patient with concerns that there is interaction warning with current medications:  butalbital-acetaminophen-caffeine (FIORICET) 50-325-40 MG tablet -reporting a level 2 drug interaction with Atogepant (QULIPTA) 30 MG TABS   Chief Complaint: requesting medication review   Disposition: [] ED /[] Urgent Care (no appt availability in office) / [] Appointment(In office/virtual)/ []  [facility]/ [] Home Care/ [] Refused Recommended Disposition /[] [facility]/ [x]  Follow-up with PCP Additional Notes: Please call patient to let her know if she should take the new Rx

## 2024-02-09 NOTE — Progress Notes (Signed)
Name: Brandy Johnston   MRN: [id number]    DOB: September 25, 1961   Date:02/09/2024       Progress Note  Subjective  Chief Complaint  Chief Complaint  Patient presents with   Cough   Nasal Congestion    I connected with  [name]  on 02/09/24 at  9:40 AM EST by a video enabled telemedicine application and verified that I am speaking with the correct person using two identifiers.  I discussed the limitations of evaluation and management by telemedicine and the availability of in person appointments. The patient expressed understanding and agreed to proceed with the virtual visit  Staff also discussed with the patient that there may be a patient responsible charge related to this service. Patient Location: at home  Provider Location: [facility] Additional Individuals present: alone   Discussed the use of AI scribe software for clinical note transcription with the patient, who gave verbal consent to proceed.  History of Present Illness   Brandy Johnston is a 63 year old female with eosinophilic asthma who presents with sinus issues and headaches.  She recently visited an ENT to evaluate for nasal polyps, which were not found, but was diagnosed with sinus issues. She was prescribed fluticasone nasal spray, two sprays in each nostril daily, and started buffered isotonic saline nasal irrigation. She has a cough and nasal congestion, which she attributes to sinus drainage. She has been using the nasal spray and started the saline rinse yesterday.  She has eosinophilic asthma, diagnosed at [facility], and is currently using Breztri inhaler, two puffs in the morning and two at night. She has been off prednisone for almost two weeks. She reports a recent increase in wheezing over the past four to five days, which has become more noticeable. No fever or chills. She experiences sneezing episodes, which she attributes to recent nasal procedures. She takes loratadine and was advised to increase the dose to twice  daily.  She reports new onset headaches over the past ten days, with a current intensity of 5-6 out of 10. She has a history of headaches but notes these are different in duration. She recently started [place] daily and is awaiting Therapist, occupational for [employer] She experiences tooth sensitivity and soreness in her jaw, with headaches radiating to under her jaw.  She plans to see a dentist soon.  She has chronic kidney disease stage 3B, with recent EGFR readings of 42. She avoids NSAIDs and reports no current use of such medications.  She has a history of diabetes and is on Ozempic 0.5 mg.       Patient Active Problem List   Diagnosis Date Noted   Small vessel disease (HCC) 01/26/2024   Eosinophilic asthma 01/18/2024   Muscle spasms of both lower extremities 01/18/2024   Chronic left shoulder pain 11/09/2022   Hyperlipidemia 01/14/2021   Incomplete tear of left rotator cuff 07/03/2020   Angioedema due to angiotensin converting enzyme inhibitor (ACE-I) 01/29/2020   Chronic bilateral back pain 07/26/2017   Coronary artery calcification 05/17/2017   Heart palpitations 05/15/2017   History of acute gastritis    Leukocytosis 09/30/2016   Atherosclerosis of abdominal aorta (HCC) 08/18/2016   Type 2 diabetes mellitus with stage 3 chronic kidney disease, without long-term current use of insulin (HCC) 12/16/2015   Diabetic neuropathy associated with type 2 diabetes mellitus (HCC) 09/18/2015   Insomnia 09/18/2015   Benign essential HTN 06/18/2015   Chronic kidney disease (CKD), stage III (moderate) (HCC) 06/18/2015  Diabetes mellitus with renal manifestation (HCC) 06/18/2015   Elevated CK 06/18/2015   Obesity (BMI 30.0-34.9) 06/18/2015   Degenerative arthritis of hip 06/18/2015   Sickle cell trait (HCC) 06/18/2015   Dyslipidemia 05/27/2010   Gastroesophageal reflux disease 05/25/2008    Past Surgical History:  Procedure Laterality Date   BREAST BIOPSY Right 12/28/2019   stereo [facility]  stromal fibrosis   CHOLECYSTECTOMY     COLONOSCOPY     ESOPHAGOGASTRODUODENOSCOPY (EGD) WITH PROPOFOL N/A 10/05/2016   Procedure: ESOPHAGOGASTRODUODENOSCOPY (EGD) WITH PROPOFOL;  Surgeon: Midge Minium, MD;  Location: [facility];  Service: Endoscopy;  Laterality: N/A;   FRACTURE SURGERY Left    cast and pins    SHOULDER ARTHROSCOPY WITH ROTATOR CUFF REPAIR AND SUBACROMIAL DECOMPRESSION Left 12/07/2018   Procedure: left shoulder manipulation under anesthesia, left shoulder arthroscopic lysis of adhesions;  Surgeon: Lyndle Herrlich, MD;  Location: [facility];  Service: Orthopedics;  Laterality: Left;   SHOULDER CLOSED REDUCTION Left 12/07/2018   Procedure: CLOSED MANIPULATION SHOULDER;  Surgeon: Lyndle Herrlich, MD;  Location: [facility];  Service: Orthopedics;  Laterality: Left;   shoulder surgery  Left 06/10/2018   Dr. Derryl Harbor   TUBAL LIGATION      Family History  Problem Relation Age of Onset   Migraines Mother    Diabetes Mother    Cancer Mother        lung   Arthritis Brother    Breast cancer Maternal Aunt    Cancer Maternal Uncle        Lung and Colon   Cirrhosis Brother    Breast cancer Cousin     Social History   Socioeconomic History   Marital status: Single    Spouse name: Not on file   Number of children: 2   Years of education: Not on file   Highest education level: 12th grade  Occupational History   Occupation: disabled  Tobacco Use   Smoking status: Never   Smokeless tobacco: Never  Vaping Use   Vaping status: Never Used  Substance and Sexual Activity   Alcohol use: Yes    Alcohol/week: 0.0 standard drinks of alcohol    Comment: rare   Drug use: Yes    Types: Marijuana    Comment: smokes marijuana occasionally   Sexual activity: Yes    Partners: Female    Birth control/protection: Other-see comments  Other Topics Concern   Not on file  Social History Narrative   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, on disability  secondary to shoulder injury in 2019    She has two grown children ( boy and a girl)    Social Drivers of Corporate investment banker Strain: Low Risk  (12/09/2022)   Overall Financial Resource Strain (CARDIA)    Difficulty of Paying Living Expenses: Not hard at all  Food Insecurity: No Food Insecurity (12/09/2022)   Hunger Vital Sign    Worried About Running Out of Food in the Last Year: Never true    Ran Out of Food in the Last Year: Never true  Transportation Needs: No Transportation Needs (12/09/2022)   PRAPARE - Administrator, Civil Service (Medical): No    Lack of Transportation (Non-Medical): No  Physical Activity: Inactive (12/09/2022)   Exercise Vital Sign    Days of Exercise per Week: 0 days    Minutes of Exercise per Session: 0 min  Stress: No Stress Concern Present (12/09/2022)   [place]  Institute of Occupational Health - Occupational Stress Questionnaire    Feeling of Stress : Not at all  Social Connections: Socially Isolated (12/09/2022)   Social Connection and Isolation Panel [NHANES]    Frequency of Communication with Friends and Family: More than three times a week    Frequency of Social Gatherings with Friends and Family: Twice a week    Attends Religious Services: Never    Database administrator or Organizations: No    Attends Banker Meetings: Never    Marital Status: Never married  Intimate Partner Violence: Not At Risk (12/09/2022)   Humiliation, Afraid, Rape, and Kick questionnaire    Fear of Current or Ex-Partner: No    Emotionally Abused: No    Physically Abused: No    Sexually Abused: No     Current Outpatient Medications:    albuterol (VENTOLIN HFA) 108 (90 Base) MCG/ACT inhaler, SMARTSIG:2 Puff(s) By Mouth Every 4-6 Hours PRN, Disp: , Rfl:    amLODipine-valsartan (EXFORGE) 5-160 MG tablet, daily., Disp: , Rfl:    aspirin (ASPIRIN 81) 81 MG chewable tablet, Chew 1 tablet (81 mg total) by mouth daily., Disp: 30 tablet, Rfl:  0   Atogepant (QULIPTA) 30 MG TABS, Take 1 tablet (30 mg total) by mouth daily at 12 noon., Disp: 30 tablet, Rfl: 2   Blood Glucose Monitoring Suppl (CONTOUR NEXT ONE) KIT, USE TO TEST BLOOD SUGAR ONCE D, Disp: , Rfl:    Budeson-Glycopyrrol-Formoterol (BREZTRI AEROSPHERE) 160-9-4.8 MCG/ACT AERO, Inhale 2 puffs into the lungs in the morning and at bedtime., Disp: , Rfl:    butalbital-acetaminophen-caffeine (FIORICET) 50-325-40 MG tablet, Take 1 tablet by mouth every 6 (six) hours as needed for headache., Disp: 20 tablet, Rfl: 0   Cholecalciferol (VITAMIN D) 2000 units CAPS, Take 1 capsule (2,000 Units total) by mouth daily., Disp: 30 capsule, Rfl: 0   Continuous Glucose Sensor (FREESTYLE LIBRE 3 SENSOR) MISC, 1 Device by Does not apply route every 14 (fourteen) days., Disp: 6 each, Rfl: 1   cyclobenzaprine (FLEXERIL) 5 MG tablet, Take 1 tablet (5 mg total) by mouth at bedtime., Disp: 30 tablet, Rfl: 0   diclofenac Sodium (VOLTAREN) 1 % GEL, Apply 2 g topically 4 (four) times daily., Disp: 100 g, Rfl: 2   diltiazem (CARDIZEM CD) 180 MG 24 hr capsule, Take 1 capsule (180 mg total) by mouth daily., Disp: 90 capsule, Rfl: 1   empagliflozin (JARDIANCE) 25 MG TABS tablet, Take 1 tablet (25 mg total) by mouth daily before breakfast., Disp: 90 tablet, Rfl: 0   ezetimibe (ZETIA) 10 MG tablet, Take 1 tablet (10 mg total) by mouth daily., Disp: 90 tablet, Rfl: 3   famotidine (PEPCID) 20 MG tablet, Take 1 tablet (20 mg total) by mouth 2 (two) times daily., Disp: 180 tablet, Rfl: 1   fluticasone (FLONASE) 50 MCG/ACT nasal spray, 2 sprays each nostril Daily. DISP#  1 bottles = 1 month supply., Disp: , Rfl:    gabapentin (NEURONTIN) 300 MG capsule, Take 1 capsule (300 mg total) by mouth 3 (three) times daily., Disp: 270 capsule, Rfl: 1   glucose blood test strip, Use as instructed, Disp: 100 each, Rfl: 12   ipratropium-albuterol (DUONEB) 0.5-2.5 (3) MG/3ML SOLN, Take 3 mLs by nebulization every 6 (six) hours as  needed., Disp: 360 mL, Rfl: 1   loratadine (CLARITIN) 10 MG tablet, Take 1 tablet (10 mg total) by mouth daily., Disp: 90 tablet, Rfl: 1   magnesium oxide (MAG-OX) 400 MG tablet, Take  1 tablet (400 mg total) by mouth 2 (two) times daily., Disp: 180 tablet, Rfl: 1   meclizine (ANTIVERT) 25 MG tablet, Take 1 tablet (25 mg total) by mouth 3 (three) times daily as needed for dizziness., Disp: 30 tablet, Rfl: 0   metoprolol succinate (TOPROL-XL) 25 MG 24 hr tablet, Take by mouth., Disp: , Rfl:    Microlet Lancets MISC, USE TO CHECK BLOOD SUGAR ONCE D, Disp: , Rfl:    nystatin (MYCOSTATIN) 100000 UNIT/ML suspension, Take by mouth., Disp: , Rfl:    ondansetron (ZOFRAN-ODT) 4 MG disintegrating tablet, Take 1 tablet (4 mg total) by mouth every 8 (eight) hours as needed for nausea., Disp: 30 tablet, Rfl: 0   pantoprazole (PROTONIX) 40 MG tablet, Take 40 mg by mouth 2 (two) times daily., Disp: , Rfl:    promethazine (PHENERGAN) 25 MG tablet, Take 1 tablet (25 mg total) by mouth every 8 (eight) hours as needed for nausea or vomiting., Disp: 20 tablet, Rfl: 0   rosuvastatin (CRESTOR) 40 MG tablet, TAKE 1 TABLET(40 MG) BY MOUTH DAILY, Disp: 90 tablet, Rfl: 3   Semaglutide,0.25 or 0.5MG /DOS, (OZEMPIC, 0.25 OR 0.5 MG/DOSE,) 2 MG/3ML SOPN, Inject 0.5 mg into the skin once a week., Disp: 6 mL, Rfl: 0   traZODone (DESYREL) 50 MG tablet, TAKE 1 TABLET(50 MG) BY MOUTH AT BEDTIME, Disp: 90 tablet, Rfl: 0   triamcinolone cream (KENALOG) 0.1 %, Apply 1 Application topically 2 (two) times daily., Disp: 453.6 g, Rfl: 0   Ubrogepant (UBRELVY) 100 MG TABS, Take 1 tablet (100 mg total) by mouth daily as needed., Disp: 16 tablet, Rfl: 0  Current Facility-Administered Medications:    promethazine (PHENERGAN) injection 25 mg, 25 mg, Intramuscular, Q6H PRN, , 25 mg at 01/26/24 1348  Allergies  Allergen Reactions   Ace Inhibitors Swelling    Angioedema    Lisinopril Swelling    Face and neck swelling    I personally  reviewed active problem list, medication list, allergies with the patient/caregiver today.   ROS  Ten systems reviewed and is negative except as mentioned in HPI    Objective  Virtual encounter, vitals not obtained.  There is no height or weight on file to calculate BMI.  Physical Exam  Awake, alert and oriented   PHQ2/9:    02/04/2024   10:09 AM 01/18/2024   11:27 AM 12/09/2023   11:21 AM 11/18/2023    9:23 AM 09/15/2023    9:26 AM  Depression screen PHQ 2/9  Decreased Interest 0 0 0 0 0  Down, Depressed, Hopeless 0 0 0 0 0  PHQ - 2 Score 0 0 0 0 0  Altered sleeping 0 0  0 0  Tired, decreased energy 0 0  0 0  Change in appetite 0 0  0 0  Feeling bad or failure about yourself  0 0  0 0  Trouble concentrating 0 0  0 0  Moving slowly or fidgety/restless 0 0  0 0  Suicidal thoughts 0 0  0 0  PHQ-9 Score 0 0  0 0  Difficult doing work/chores Not difficult at all Not difficult at all   Not difficult at all   PHQ-2/9 Result is negative.    Fall Risk:    01/18/2024   11:17 AM 11/18/2023    9:23 AM 09/15/2023    9:26 AM 09/09/2023    9:37 AM 09/08/2023    9:50 AM  Fall Risk   Falls in the past year? 0  0 0 0 0  Number falls in past yr: 0  0 0 0  Injury with Fall? 0  0 0 0  Risk for fall due to : No Fall Risks No Fall Risks No Fall Risks No Fall Risks No Fall Risks  Follow up Falls prevention discussed;Education provided;Falls evaluation completed Falls prevention discussed Falls prevention discussed;Education provided;Falls evaluation completed Falls prevention discussed;Education provided;Falls evaluation completed Falls prevention discussed     Assessment and Plan    Chronic Sinusitis Recent ENT visit revealed sinus issues but no polyps. Patient reports cough and congestion. Initiated Fluticasone nasal spray and buffered isotonic saline nasal irrigation. -Continue Fluticasone nasal spray as prescribed. -Continue buffered isotonic saline nasal irrigation as  instructed.  Eosinophilic Asthma Patient reports a slight cough and recent onset of wheezing. Currently off prednisone and continuing Breztri. -Continue Breztri inhaler twice daily. -Use Albuterol nebulizer as needed between Breztri doses. -Consider restarting prednisone if symptoms worsen.  Migraine Headaches Patient reports persistent headaches. Recently started [place] and awaiting approval for Bernita Raisin. -Continue Qulipta daily. -Obtain Bernita Raisin as soon as approved and use as needed for migraines.  Chronic Kidney Disease (Stage 3B) Recent decline in eGFR from 51 to 42. Patient advised to avoid NSAIDs. -Monitor kidney function closely. -Avoid NSAIDs.  Dysphagia Patient reports occasional difficulty swallowing. Upcoming appointment with a gastroenterologist to evaluate for possible eosinophilic esophagitis. -Attend scheduled GI appointment.  General Health Maintenance -Continue Loratadine daily, increase to twice daily as needed for sneezing. Contact us if worsening of symptoms may need to resume prednisone - short course -Try over-the-counter Mucinex for congestion. -Attend upcoming neurology appointment for headache management. -See dentist to evaluate for possible dental infection contributing to headaches.     I discussed the assessment and treatment plan with the patient. The patient was provided an opportunity to ask questions and all were answered. The patient agreed with the plan and demonstrated an understanding of the instructions.  The patient was advised to call back or seek an in-person evaluation if the symptoms worsen or if the condition fails to improve as anticipated.  I provided 25 minutes of non-face-to-face time during this encounter.

## 2024-02-09 NOTE — Telephone Encounter (Signed)
Reason for Disposition . [1] Caller has NON-URGENT medicine question about med that PCP prescribed AND [2] triager unable to answer question  Answer Assessment - Initial Assessment Questions 1. NAME of MEDICINE: "What medicine(s) are you calling about?"    butalbital-acetaminophen-caffeine (FIORICET) 50-325-40 MG tablet -reporting a level 2 drug interaction with Atogepant (QULIPTA) 30 MG TABS 2. QUESTION: "What is your question?" (e.g., double dose of medicine, side effect)     Level 2 drug interaction- patient is concerned and would like provider to review medication before she starts 3. PRESCRIBER: "Who prescribed the medicine?" Reason: if prescribed by specialist, call should be referred to that group.     PCP  Protocols used: Medication Question Call-A-AH

## 2024-02-14 DIAGNOSIS — E1142 Type 2 diabetes mellitus with diabetic polyneuropathy: Secondary | ICD-10-CM | POA: Diagnosis not present

## 2024-02-14 DIAGNOSIS — N183 Chronic kidney disease, stage 3 unspecified: Secondary | ICD-10-CM | POA: Diagnosis not present

## 2024-02-14 DIAGNOSIS — J9601 Acute respiratory failure with hypoxia: Secondary | ICD-10-CM | POA: Diagnosis not present

## 2024-02-14 DIAGNOSIS — J4551 Severe persistent asthma with (acute) exacerbation: Secondary | ICD-10-CM | POA: Diagnosis not present

## 2024-02-14 DIAGNOSIS — K219 Gastro-esophageal reflux disease without esophagitis: Secondary | ICD-10-CM | POA: Diagnosis not present

## 2024-02-14 DIAGNOSIS — E1165 Type 2 diabetes mellitus with hyperglycemia: Secondary | ICD-10-CM | POA: Diagnosis not present

## 2024-02-14 DIAGNOSIS — I10 Essential (primary) hypertension: Secondary | ICD-10-CM | POA: Diagnosis not present

## 2024-02-14 DIAGNOSIS — I422 Other hypertrophic cardiomyopathy: Secondary | ICD-10-CM | POA: Diagnosis not present

## 2024-02-14 DIAGNOSIS — E1122 Type 2 diabetes mellitus with diabetic chronic kidney disease: Secondary | ICD-10-CM | POA: Diagnosis not present

## 2024-02-16 DIAGNOSIS — R062 Wheezing: Secondary | ICD-10-CM | POA: Diagnosis not present

## 2024-02-16 DIAGNOSIS — N183 Chronic kidney disease, stage 3 unspecified: Secondary | ICD-10-CM | POA: Diagnosis not present

## 2024-02-16 DIAGNOSIS — R079 Chest pain, unspecified: Secondary | ICD-10-CM | POA: Diagnosis not present

## 2024-02-16 DIAGNOSIS — I5030 Unspecified diastolic (congestive) heart failure: Secondary | ICD-10-CM | POA: Diagnosis not present

## 2024-02-16 DIAGNOSIS — J4551 Severe persistent asthma with (acute) exacerbation: Secondary | ICD-10-CM | POA: Diagnosis not present

## 2024-02-16 DIAGNOSIS — D7219 Other eosinophilia: Secondary | ICD-10-CM | POA: Diagnosis not present

## 2024-02-16 DIAGNOSIS — R1310 Dysphagia, unspecified: Secondary | ICD-10-CM | POA: Diagnosis not present

## 2024-02-16 DIAGNOSIS — J45901 Unspecified asthma with (acute) exacerbation: Secondary | ICD-10-CM | POA: Diagnosis not present

## 2024-02-16 DIAGNOSIS — I421 Obstructive hypertrophic cardiomyopathy: Secondary | ICD-10-CM | POA: Diagnosis not present

## 2024-02-16 DIAGNOSIS — R059 Cough, unspecified: Secondary | ICD-10-CM | POA: Diagnosis not present

## 2024-02-16 DIAGNOSIS — J189 Pneumonia, unspecified organism: Secondary | ICD-10-CM | POA: Diagnosis not present

## 2024-02-16 DIAGNOSIS — R0902 Hypoxemia: Secondary | ICD-10-CM | POA: Diagnosis not present

## 2024-02-16 DIAGNOSIS — J8283 Eosinophilic asthma: Secondary | ICD-10-CM | POA: Diagnosis not present

## 2024-02-16 DIAGNOSIS — J188 Other pneumonia, unspecified organism: Secondary | ICD-10-CM | POA: Diagnosis not present

## 2024-02-16 DIAGNOSIS — E1122 Type 2 diabetes mellitus with diabetic chronic kidney disease: Secondary | ICD-10-CM | POA: Diagnosis not present

## 2024-02-16 DIAGNOSIS — I129 Hypertensive chronic kidney disease with stage 1 through stage 4 chronic kidney disease, or unspecified chronic kidney disease: Secondary | ICD-10-CM | POA: Diagnosis not present

## 2024-02-16 DIAGNOSIS — N189 Chronic kidney disease, unspecified: Secondary | ICD-10-CM | POA: Diagnosis not present

## 2024-02-16 DIAGNOSIS — I422 Other hypertrophic cardiomyopathy: Secondary | ICD-10-CM | POA: Diagnosis not present

## 2024-02-16 DIAGNOSIS — R0602 Shortness of breath: Secondary | ICD-10-CM | POA: Diagnosis not present

## 2024-02-16 DIAGNOSIS — R918 Other nonspecific abnormal finding of lung field: Secondary | ICD-10-CM | POA: Diagnosis not present

## 2024-02-16 DIAGNOSIS — I2510 Atherosclerotic heart disease of native coronary artery without angina pectoris: Secondary | ICD-10-CM | POA: Diagnosis not present

## 2024-02-16 DIAGNOSIS — J690 Pneumonitis due to inhalation of food and vomit: Secondary | ICD-10-CM | POA: Diagnosis not present

## 2024-02-21 ENCOUNTER — Ambulatory Visit: Payer: Self-pay | Admitting: Family Medicine

## 2024-02-21 NOTE — Telephone Encounter (Signed)
 Copied from CRM [id number]. Topic: General - Other >> Feb 21, 2024  9:07 AM Everette C wrote: Reason for CRM: The patient has called to request contact with a member of staff when possible  The patient would like to discuss resubmission of a PCS form  Please contact when available

## 2024-02-21 NOTE — Telephone Encounter (Signed)
 Form re-faxed

## 2024-02-22 ENCOUNTER — Telehealth: Payer: Self-pay

## 2024-02-22 NOTE — Transitions of Care (Post Inpatient/ED Visit) (Signed)
 02/22/2024  Name: Brandy Johnston MRN: [id number] DOB: 1961-08-08  Today's TOC FU Call Status: Today's TOC FU Call Status:: Successful TOC FU Call Completed TOC FU Call Complete Date: 02/22/24 Patient's Name and Date of Birth confirmed.  Transition Care Management Follow-up Telephone Call Date of Discharge: 02/21/24 Discharge Facility: Other ([facility]) Name of Other ([facility]) Discharge Facility: [facility] Type of Discharge: Inpatient Admission Primary Inpatient Discharge Diagnosis:: Shortness of breath (Primary Dx); Hypoxia How have you been since you were released from the hospital?: Better Any questions or concerns?: No  Items Reviewed: Medications obtained,verified, and reconciled?: Yes (Medications Reviewed) (Medication reconciliation completed based on recent discharge summary Patient taking medications as instructed and is aware of any changes or dosage adjustments medication regimen. Patient denies questions and reports no barriers to medication adherence) Dietary orders reviewed?: Yes Type of Diet Ordered:: Regular Heart Healthy Carb Modified  Do you have support at home?: Yes People in Home: alone Name of Support/Comfort Primary Source: Son Bear Dance, Sister [name]  Medications Reviewed Today: Medications Reviewed Today     Reviewed by Johnnette Barrios, RN (Registered Nurse) on 02/22/24 at 1317  Med List Status: <None>   Medication Order Taking? Sig Documenting Provider Last Dose Status Informant  albuterol (VENTOLIN HFA) 108 (90 Base) MCG/ACT inhaler [id number] Yes SMARTSIG:2 Puff(s) By Mouth Every 4-6 Hours PRN [provider] Taking Active   amLODipine-valsartan (EXFORGE) 5-160 MG tablet [id number] Yes daily. [provider] Taking Active   aspirin (ASPIRIN 81) 81 MG chewable tablet [id number] Yes Chew 1 tablet (81 mg total) by mouth daily. Alba Cory, MD Taking Active Self  Atogepant (QULIPTA) 30 MG TABS [id number] Yes Take 1 tablet  (30 mg total) by mouth daily at 12 noon. Alba Cory, MD Taking Active   Blood Glucose Monitoring Suppl (CONTOUR NEXT ONE) KIT [id number] Yes USE TO TEST BLOOD SUGAR ONCE D [provider] Taking Active Self  Budeson-Glycopyrrol-Formoterol (BREZTRI AEROSPHERE) 160-9-4.8 MCG/ACT AERO [id number] Yes Inhale 2 puffs into the lungs in the morning and at bedtime. [provider] Taking Active   butalbital-acetaminophen-caffeine (FIORICET) 50-325-40 MG tablet [id number] Yes Take 1 tablet by mouth every 6 (six) hours as needed for headache. Alba Cory, MD Taking Active   Cholecalciferol (VITAMIN D) 2000 units CAPS [id number] Yes Take 1 capsule (2,000 Units total) by mouth daily. Alba Cory, MD Taking Active Self  Continuous Glucose Sensor (FREESTYLE LIBRE 3 SENSOR) [place] [id number] Yes 1 Device by Does not apply route every 14 (fourteen) days. Alba Cory, MD Taking Active            Med Note Sharon Seller, Danial Sisley L   Tue Feb 22, 2024  1:10 PM) Continue sliding scale insulin given history of steroid-induced hyperglycemia; has had minimal sliding scale requirements   cyclobenzaprine (FLEXERIL) 5 MG tablet [id number] Yes Take 1 tablet (5 mg total) by mouth at bedtime. Alba Cory, MD Taking Active   diclofenac Sodium (VOLTAREN) 1 % GEL [id number] Yes Apply 2 g topically 4 (four) times daily. Alba Cory, MD Taking Active   diltiazem (CARDIZEM CD) 180 MG 24 hr capsule [id number] Yes Take 1 capsule (180 mg total) by mouth daily. Alba Cory, MD Taking Active   doxycycline (ADOXA) 100 MG tablet [id number] Yes Take 100 mg by mouth daily. 5 day course; transition to PO at discharge. MRSA nares negative. [provider] Taking Active   empagliflozin (JARDIANCE) 25 MG TABS tablet [id number] Yes Take 1 tablet (25 mg total) by mouth daily before  breakfast. Alba Cory, MD Taking Active   ezetimibe (ZETIA) 10 MG tablet [id number] Yes Take 1 tablet (10 mg total) by mouth daily.  Alba Cory, MD Taking Active   famotidine (PEPCID) 20 MG tablet [id number] Yes Take 1 tablet (20 mg total) by mouth 2 (two) times daily. Alba Cory, MD Taking Active   fluticasone [facility]) 50 MCG/ACT nasal spray [id number] Yes 2 sprays each nostril Daily. DISP#  1 bottles = 1 month supply. [provider] Taking Active   gabapentin (NEURONTIN) 300 MG capsule [id number] Yes Take 1 capsule (300 mg total) by mouth 3 (three) times daily. Alba Cory, MD Taking Active   glucose blood test strip [id number] Yes Use as instructed Alba Cory, MD Taking Active Self  ipratropium-albuterol (DUONEB) 0.5-2.5 (3) MG/3ML SOLN [id number] Yes Take 3 mLs by nebulization every 6 (six) hours as needed. Berniece Salines, FNP Taking Active     Discontinued 01/29/20 1715   loratadine (CLARITIN) 10 MG tablet [id number] Yes Take 1 tablet (10 mg total) by mouth daily. Alba Cory, MD Taking Active   magnesium oxide (MAG-OX) 400 MG tablet [id number] Yes Take 1 tablet (400 mg total) by mouth 2 (two) times daily. Alba Cory, MD Taking Active   meclizine (ANTIVERT) 25 MG tablet [id number] Yes Take 1 tablet (25 mg total) by mouth 3 (three) times daily as needed for dizziness. Corena Herter, MD Taking Active   metoprolol succinate (TOPROL-XL) 25 MG 24 hr tablet [id number] Yes Take by mouth. [provider] Taking Active   Microlet Lancets MISC [id number] Yes USE TO CHECK BLOOD SUGAR ONCE D [provider] Taking Active Self  nystatin (MYCOSTATIN) 100000 UNIT/ML suspension [id number] Yes Take by mouth. [provider] Taking Active   ondansetron (ZOFRAN-ODT) 4 MG disintegrating tablet [id number] Yes Take 1 tablet (4 mg total) by mouth every 8 (eight) hours as needed for nausea. Berniece Salines, FNP Taking Active   pantoprazole (PROTONIX) 40 MG tablet [id number] Yes Take 40 mg by mouth 2 (two) times daily. [provider] Taking Active   predniSONE (DELTASONE) 10 MG tablet  [id number] Yes Take 10 mg by mouth daily with breakfast. 60 mg daily, plan for 7-days then taper by 10mg  every 7 days. [provider] Taking Active   promethazine (PHENERGAN) 25 MG tablet [id number] Yes Take 1 tablet (25 mg total) by mouth every 8 (eight) hours as needed for nausea or vomiting. Berniece Salines, FNP Taking Active   promethazine [facility]) injection 25 mg [id number]   Alba Cory, MD  Active   rosuvastatin (CRESTOR) 40 MG tablet [id number] Yes TAKE 1 TABLET(40 MG) BY MOUTH DAILY Carlynn Purl, Danna Hefty, MD Taking Active   Semaglutide,0.25 or 0.5MG /DOS, (OZEMPIC, 0.25 OR 0.5 MG/DOSE,) 2 MG/3ML SOPN [id number] Yes Inject 0.5 mg into the skin once a week. Alba Cory, MD Taking Active   sulfamethoxazole-trimethoprim (BACTRIM DS) 800-160 MG tablet [id number] Yes Take 1 tablet by mouth daily. MWF bactrim PJP prophylaxis [provider] Taking Active   traZODone (DESYREL) 50 MG tablet [id number] Yes TAKE 1 TABLET(50 MG) BY MOUTH AT BEDTIME Alba Cory, MD Taking Active   triamcinolone cream (KENALOG) 0.1 % [id number] Yes Apply 1 Application topically 2 (two) times daily. Alba Cory, MD Taking Active   Ubrogepant (UBRELVY) 100 MG TABS [id number] Yes Take 1 tablet (100 mg total) by mouth daily as needed. Alba Cory, MD Taking Active           Medication reconciliation / review completed based on most recent discharge summary  and EHR medication list. Confirmed patient is taking all newly prescribed medications as instructed (any discrepancies are noted in review section)   Patient is aware of any changes to and / or  any dosage adjustments to medication regimen. Patient denies questions at this time and reports no barriers to medication adherence.  She will review with PCP 2/27 as well added new orders from recent hospital discharge  -Continue prednisone and higher dose of 60 mg daily, plan for 7-days then taper by 10mg  every 7 days. -Started MWF bactrim PJP  prophylaxis, she is on PPI  Given concern for CAP, continue ceftriaxone and doxycyline for 5 day course; transition to PO at discharge. MRSA nares negative.  Continue sliding scale insulin given history of steroid-induced hyperglycemia; has had minimal sliding scale requirements   Home Care and Equipment/Supplies: Were Home Health Services Ordered?: Yes Name of Home Health Agency:: [facility] Has Agency set up a time to come to your home?: Yes First Home Health Visit Date: 02/24/24 (She was previously followed by [facility]) Any new equipment or medical supplies ordered?: No  Functional Questionnaire: Do you need assistance with bathing/showering or dressing?: No Do you need assistance with meal preparation?: No Do you need assistance with eating?: No Do you have difficulty maintaining continence: No Do you need assistance with getting out of bed/getting out of a chair/moving?: No Do you have difficulty managing or taking your medications?: No  Follow up appointments reviewed: PCP Follow-up appointment confirmed?: Yes Date of PCP follow-up appointment?: 02/24/24 Follow-up Provider: Available  Alba Cory, MD Specialist Hospital Follow-up appointment confirmed?: Yes Date of Specialist follow-up appointment?: 04/21/24 Follow-Up Specialty Provider:: Cardiologiat 4/25, Pumonologist 3/25 Do you need transportation to your follow-up appointment?:  ([employer] services or to schedule a ride, call [phone].) Do you understand care options if your condition(s) worsen?: Yes-patient verbalized understanding  SDOH Interventions Today    Flowsheet Row Most Recent Value  SDOH Interventions   Food Insecurity Interventions Intervention Not Indicated  Housing Interventions Intervention Not Indicated  Transportation Interventions Intervention Not Indicated, Patient Resources (Friends/Family), Payor Benefit  Utilities Interventions Intervention Not Indicated       Goals Addressed              This Visit's Progress    TOC Care Plan       Current Barriers:  Medication management multiple changes  Provider appointments PCP Pulmonology Cardiology Transportation Uses Benefits, [facility] Public and Family Friends  Chronic Disease Management support and education needs related to DMII and Pulmonary Disease   RNCM Clinical Goal(s):  Patient will work with the Care Management team over the next 30 days to address Transition of Care Barriers: Medication Management Support at home Provider appointments take all medications exactly as prescribed and will call provider for medication related questions as evidenced by no missed medication doses attend all scheduled medical appointments: as evidenced by no missed follow-up visits  through collaboration with RN Care manager, provider, and care team.   Interventions: Evaluation of current treatment plan related to  self management and patient's adherence to plan as established by provider  Transitions of Care:  New goal. Doctor Visits  - discussed the importance of doctor visits Post discharge activity limitations prescribed by provider reviewed Reviewed Signs and symptoms of infection  Asthma: (Status:New goal.) Short Term Goal Provided instruction about proper use of medications used for management of Asthma including inhalers Provided education about and advised patient to utilize infection prevention strategies to reduce risk of respiratory  infection Discussed the importance of adequate rest and management of fatigue with Asthma Screening for signs and symptoms of depression related to chronic disease state  Assessed social determinant of health barriers   Patient Goals/Self-Care Activities: Participate in Transition of Care Program/Attend TOC scheduled calls Take all medications as prescribed Attend all scheduled provider appointments Call pharmacy for medication refills 3-7 days in advance of running out of medications Perform all  self care activities independently  Perform IADL's (shopping, preparing meals, housekeeping, managing finances) independently Call provider office for new concerns or questions   Follow Up Plan:  Telephone follow up appointment with care management team member scheduled for:  03/01/24 @ 12 N  The patient has been provided with contact information for the care management team and has been advised to call with any health related questions or concerns.         Patient is at high risk for readmission and/or has history of  high utilization  Discussed VBCI  TOC program and weekly calls to patient to assess condition/status, medication management  and provide support/education as indicated . Patient/ Caregiver voiced understanding and is  agreeable to 30 day program    Routine follow-up and on-going assessment evaluation and education of disease processes, recommended interventions for both chronic and acute medical conditions , will occur during each weekly visit along with ongoing review of symptoms ,medication reviews and reconciliation. Any updates , inconsistencies, discrepancies or acute care concerns will be addressed and routed to the correct Practitioner if indicated   Based on current information and Insurance plan -Reviewed benefits available to patient, including details about eligibility options for care if any area of needs were identified.  Reviewed patients ability to access and / or navigating the benefits system..Amb Referral made if indicted , refer to orders section of note for details   Please refer to Care Plan for goals and interventions -Effectiveness of interventions, symptom management and outcomes will be evaluated  weekly during [facility] 30-day Program Outreach calls  . Any necessary  changes and updates to Care Plan will be completed episodically    Reviewed goals for care Patient verbalizes understanding of instructions and care plan provided. Patient was encouraged to make  informed decisions about their care, actively participate in managing their health condition, and implement lifestyle changes as needed to promote independence and self-management of health care   Patient was encouraged to Contact PCP with any changes in baseline or  medication regimen,  changes in health status  /  well-being, safety concerns, including falls any questions or concerns regarding ongoing medical care, any difficulty obtaining or picking up prescriptions, any changes or worsening in condition- including  symptoms not relieved  with interventions    The patient has been provided with contact information for the care management team and has been advised to call with any health-related questions or concerns. Follow up as indicated with Care Team , or sooner should any new problems arise.   Susa Loffler , BSN, RN [facility]   VBCI-Population Health RN Care Manager Direct Dial [phone]  Fax: [phone] Website: [email or web address]

## 2024-02-22 NOTE — Patient Instructions (Signed)
 Visit Information  Thank you for taking time to visit with me today. Please don't hesitate to contact me if I can be of assistance to you before our next scheduled telephone appointment.  Our next appointment is by telephone on 3/5  at 12N  Following is a copy of your care plan:   Goals Addressed             This Visit's Progress    TOC Care Plan       Current Barriers:  Medication management multiple changes  Provider appointments PCP Pulmonology Cardiology Transportation Uses Benefits, [facility] Public and Family Friends  Chronic Disease Management support and education needs related to DMII and Pulmonary Disease   RNCM Clinical Goal(s):  Patient will work with the Care Management team over the next 30 days to address Transition of Care Barriers: Medication Management Support at home Provider appointments take all medications exactly as prescribed and will call provider for medication related questions as evidenced by no missed medication doses attend all scheduled medical appointments: as evidenced by no missed follow-up visits  through collaboration with RN Care manager, provider, and care team.   Interventions: Evaluation of current treatment plan related to  self management and patient's adherence to plan as established by provider  Transitions of Care:  New goal. Doctor Visits  - discussed the importance of doctor visits Post discharge activity limitations prescribed by provider reviewed Reviewed Signs and symptoms of infection  Asthma: (Status:New goal.) Short Term Goal Provided instruction about proper use of medications used for management of Asthma including inhalers Provided education about and advised patient to utilize infection prevention strategies to reduce risk of respiratory infection Discussed the importance of adequate rest and management of fatigue with Asthma Screening for signs and symptoms of depression related to chronic disease state  Assessed social  determinant of health barriers   Patient Goals/Self-Care Activities: Participate in Transition of Care Program/Attend TOC scheduled calls Take all medications as prescribed Attend all scheduled provider appointments Call pharmacy for medication refills 3-7 days in advance of running out of medications Perform all self care activities independently  Perform IADL's (shopping, preparing meals, housekeeping, managing finances) independently Call provider office for new concerns or questions   Follow Up Plan:  Telephone follow up appointment with care management team member scheduled for:  03/01/24 @ 12 N  The patient has been provided with contact information for the care management team and has been advised to call with any health related questions or concerns.          Medication review  Reviewed current home medications -- provided education as needed. Patient is aware of potential side effects and was encouraged to notify PCP for any adverse side effects or unwanted symptoms not relieved with interventions  Patient will call 911 for Medical Emergencies or Life -Threatening Symptoms.  Reviewed goals for care Patient/ Caregiver verbalizes understanding of instructions with the plan of care . The  Patient / Caregiver was encouraged to make informed decisions about care, actively participate in managing health conditions, and implement lifestyle changes as needed to promote independence and self-management of healthcare. SDOH screenings have been completed and addressed if indicted.  There are no reported barriers to care.    Follow-up Plan VBCI Case Management Nurse will provide follow-up and on-going assessment ,evaluation and education of disease processes, recommended interventions for both chronic and acute medical conditions ,  along with ongoing review of symptoms ,medication reviews / reconciliation during each weekly call .  Any updates , inconsistencies, discrepancies or acute care  concerns will be addressed and routed to the correct Practitioner if indicated   [facility]  Please call the care guide team at [phone]  if you need to cancel or reschedule your appointment . For scheduled calls -Three attempts will be made to reach you -if the scheduled call is missed or  we are unable to reach the you after 3 attempts no additional outreach attempts will be made and the TOC follow-up will be closed .   If you need to speak to a Nurse you may  call me directly at the number below or if I am unavailable,and  your need is urgent  please call the main VBCI number at [phone] and ask to speak with one of the [facility] ( Transition of Care )  Nurses  .  Patient was encouraged to Contact PCP with any changes in baseline or  medication regimen,  changes in health status  /  well-being, safety concerns, including falls any questions or concerns regarding ongoing medical care, any difficulty obtaining or picking up prescriptions, any changes or worsening in condition- including  symptoms not relieved  with interventions                                                                            Additionally, If you experience worsening of your symptoms, develop shortness of breath, If you are experiencing a medical emergency,  develop suicidal or homicidal thoughts you must seek medical attention immediately by calling 911 or report to your local emergency department or urgent care.   If you have a non-emergency medical problem during routine business hours, please contact your provider's office and ask to speak with a nurse.       Please take the time to read instructions/literature along with the possible adverse reactions/side effects for all the Medicines that have been prescribed to you. Only take newly prescribed  Medications after you have completely understood and accept all the possible adverse reactions/side effects.   Do not take more than prescribed Medications  for  Pain, Sleep and Anxiety. Do not drive when taking Pain medications or sleep aid/ insomnia  medications It is not advisable to combine anxiety, sleep and pain medications without talking with your primary care practitioner    If you are experiencing a Mental Health or Behavioral Health Crisis or need someone to talk to Please call the Suicide and Crisis Lifeline: 988 You may also call the [place] National Suicide Prevention Lifeline: 469-084-2532 or TTY: (312)585-9949 TTY 220-273-3788) to talk to a trained counselor.  You may call the Behavioral Health Crisis Line at 561-689-2271, at any time, 24 hours a day, 7 days a week- however If you are in danger or need immediate medical attention, call 911.   If you would like help to quit smoking, call 1-800-QUIT-NOW ( (407)239-6638) OR Espaol: 1-855-Djelo-Ya (7-564-332-9518) o para ms informacin haga clic aqu or Text READY to 841-660 to register via text.   Susa Loffler , BSN, RN [place]   VBCI-Population Health RN Care Manager Direct Dial [phone]  Website: [email or web address]

## 2024-02-23 ENCOUNTER — Telehealth: Payer: Self-pay

## 2024-02-23 DIAGNOSIS — J45901 Unspecified asthma with (acute) exacerbation: Secondary | ICD-10-CM | POA: Diagnosis not present

## 2024-02-23 DIAGNOSIS — I421 Obstructive hypertrophic cardiomyopathy: Secondary | ICD-10-CM | POA: Diagnosis not present

## 2024-02-23 DIAGNOSIS — J8283 Eosinophilic asthma: Secondary | ICD-10-CM | POA: Diagnosis not present

## 2024-02-23 DIAGNOSIS — R1310 Dysphagia, unspecified: Secondary | ICD-10-CM | POA: Diagnosis not present

## 2024-02-23 DIAGNOSIS — I129 Hypertensive chronic kidney disease with stage 1 through stage 4 chronic kidney disease, or unspecified chronic kidney disease: Secondary | ICD-10-CM | POA: Diagnosis not present

## 2024-02-23 DIAGNOSIS — E1142 Type 2 diabetes mellitus with diabetic polyneuropathy: Secondary | ICD-10-CM | POA: Diagnosis not present

## 2024-02-23 DIAGNOSIS — J189 Pneumonia, unspecified organism: Secondary | ICD-10-CM | POA: Diagnosis not present

## 2024-02-23 DIAGNOSIS — E1122 Type 2 diabetes mellitus with diabetic chronic kidney disease: Secondary | ICD-10-CM | POA: Diagnosis not present

## 2024-02-23 DIAGNOSIS — N183 Chronic kidney disease, stage 3 unspecified: Secondary | ICD-10-CM | POA: Diagnosis not present

## 2024-02-23 NOTE — Telephone Encounter (Signed)
 VO given to [facility]   Copied from CRM [id number]. Topic: Clinical - Home Health Verbal Orders >> Feb 23, 2024  3:57 PM Geroge Baseman wrote: Caller/Agency: [facility] Callback Number: [phone] Service Requested: Skilled Nursing Frequency: PT 1 w 2, Nurse: assement Any new concerns about the patient? Yes, patient does not any physical therapy, only nurse evaluation. Will be seen once. But patient needs a nurse.

## 2024-02-24 ENCOUNTER — Encounter: Payer: Self-pay | Admitting: Family Medicine

## 2024-02-24 ENCOUNTER — Telehealth: Payer: Self-pay | Admitting: Family Medicine

## 2024-02-24 ENCOUNTER — Ambulatory Visit (INDEPENDENT_AMBULATORY_CARE_PROVIDER_SITE_OTHER): Payer: Medicare HMO | Admitting: Family Medicine

## 2024-02-24 VITALS — BP 112/74 | HR 77 | Resp 16 | Ht 60.0 in | Wt 168.8 lb

## 2024-02-24 DIAGNOSIS — I422 Other hypertrophic cardiomyopathy: Secondary | ICD-10-CM | POA: Diagnosis not present

## 2024-02-24 DIAGNOSIS — J8283 Eosinophilic asthma: Secondary | ICD-10-CM | POA: Diagnosis not present

## 2024-02-24 DIAGNOSIS — Z09 Encounter for follow-up examination after completed treatment for conditions other than malignant neoplasm: Secondary | ICD-10-CM

## 2024-02-24 NOTE — Telephone Encounter (Signed)
Please schedule annual wellness

## 2024-02-24 NOTE — Progress Notes (Signed)
 Name: Brandy Johnston   MRN: [id number]    DOB: 10-03-1961   Date:02/24/2024       Progress Note  Subjective  Chief Complaint  Chief Complaint  Patient presents with   Hospitalization Follow-up   HPI   Admission date: 02/16/2024 Discharge date: 02/21/2024   Brandy Johnston developed SOB and went to [facility] on 02/16/2024 , she was diagnosed with acute exacerbation of eosinophilic asthma, given high dose steroids, during her stay she developed acute chest pain on 02/18/2024 and CXR showed new right lower lung consolidation versus atelectasis. She was treated with ceftriazone and doxy and was sent home to complete with Augmentin and doxy ( she finished medications) . She states SOB is better than before admission, she has noticed some palpitation intermittently and has a visit scheduled with cardiologist. She is still taking high dose steroids and has a long taper, she will see pulmonologist March 11 th. She also has a scheduled visit with GI for April due to possible GERD versus esophageal dysmotility causing respiratory symptoms.   During her stay she also had a cardiac MRI due to echocardiogram early in 2025 showing hypertrophic cardiomyopathy , the result of MRI showed possible LV outflow tract obstruction and cardiologist advised to go up on diltiazem from 180 mg to 240 mg and continue metoprolol at 12.5 mg    Patient Active Problem List   Diagnosis Date Noted   Small vessel disease (HCC) 01/26/2024   Eosinophilic asthma 01/18/2024   Muscle spasms of both lower extremities 01/18/2024   Chronic left shoulder pain 11/09/2022   Hyperlipidemia 01/14/2021   Incomplete tear of left rotator cuff 07/03/2020   Angioedema due to angiotensin converting enzyme inhibitor (ACE-I) 01/29/2020   Chronic bilateral back pain 07/26/2017   Coronary artery calcification 05/17/2017   Heart palpitations 05/15/2017   History of acute gastritis    Leukocytosis 09/30/2016   Atherosclerosis of abdominal aorta (HCC)  08/18/2016   Type 2 diabetes mellitus with stage 3 chronic kidney disease, without long-term current use of insulin (HCC) 12/16/2015   Diabetic neuropathy associated with type 2 diabetes mellitus (HCC) 09/18/2015   Insomnia 09/18/2015   Benign essential HTN 06/18/2015   Chronic kidney disease (CKD), stage III (moderate) (HCC) 06/18/2015   Diabetes mellitus with renal manifestation (HCC) 06/18/2015   Elevated CK 06/18/2015   Obesity (BMI 30.0-34.9) 06/18/2015   Degenerative arthritis of hip 06/18/2015   Sickle cell trait (HCC) 06/18/2015   Dyslipidemia 05/27/2010   Gastroesophageal reflux disease 05/25/2008    Past Surgical History:  Procedure Laterality Date   BREAST BIOPSY Right 12/28/2019   stereo [facility] stromal fibrosis   CHOLECYSTECTOMY     COLONOSCOPY     ESOPHAGOGASTRODUODENOSCOPY (EGD) WITH PROPOFOL N/A 10/05/2016   Procedure: ESOPHAGOGASTRODUODENOSCOPY (EGD) WITH PROPOFOL;  Surgeon: Midge Minium, MD;  Location: [facility];  Service: Endoscopy;  Laterality: N/A;   FRACTURE SURGERY Left    cast and pins    SHOULDER ARTHROSCOPY WITH ROTATOR CUFF REPAIR AND SUBACROMIAL DECOMPRESSION Left 12/07/2018   Procedure: left shoulder manipulation under anesthesia, left shoulder arthroscopic lysis of adhesions;  Surgeon: Lyndle Herrlich, MD;  Location: [facility];  Service: Orthopedics;  Laterality: Left;   SHOULDER CLOSED REDUCTION Left 12/07/2018   Procedure: CLOSED MANIPULATION SHOULDER;  Surgeon: Lyndle Herrlich, MD;  Location: [facility];  Service: Orthopedics;  Laterality: Left;   shoulder surgery  Left 06/10/2018   Dr. Derryl Harbor   TUBAL LIGATION      Family History  Problem Relation  Age of Onset   Migraines Mother    Diabetes Mother    Cancer Mother        lung   Arthritis Brother    Breast cancer Maternal Aunt    Cancer Maternal Uncle        Lung and Colon   Cirrhosis Brother    Breast cancer Cousin     Social History   Tobacco Use   Smoking status: Never   Smokeless  tobacco: Never  Substance Use Topics   Alcohol use: Yes    Alcohol/week: 0.0 standard drinks of alcohol    Comment: rare     Current Outpatient Medications:    albuterol (VENTOLIN HFA) 108 (90 Base) MCG/ACT inhaler, SMARTSIG:2 Puff(s) By Mouth Every 4-6 Hours PRN, Disp: , Rfl:    amLODipine-valsartan (EXFORGE) 5-160 MG tablet, daily., Disp: , Rfl:    aspirin (ASPIRIN 81) 81 MG chewable tablet, Chew 1 tablet (81 mg total) by mouth daily., Disp: 30 tablet, Rfl: 0   Atogepant (QULIPTA) 30 MG TABS, Take 1 tablet (30 mg total) by mouth daily at 12 noon., Disp: 30 tablet, Rfl: 2   Blood Glucose Monitoring Suppl (CONTOUR NEXT ONE) KIT, USE TO TEST BLOOD SUGAR ONCE D, Disp: , Rfl:    Budeson-Glycopyrrol-Formoterol (BREZTRI AEROSPHERE) 160-9-4.8 MCG/ACT AERO, Inhale 2 puffs into the lungs in the morning and at bedtime., Disp: , Rfl:    butalbital-acetaminophen-caffeine (FIORICET) 50-325-40 MG tablet, Take 1 tablet by mouth every 6 (six) hours as needed for headache., Disp: 20 tablet, Rfl: 0   Cholecalciferol (VITAMIN D) 2000 units CAPS, Take 1 capsule (2,000 Units total) by mouth daily., Disp: 30 capsule, Rfl: 0   Continuous Glucose Sensor (FREESTYLE LIBRE 3 SENSOR) MISC, 1 Device by Does not apply route every 14 (fourteen) days., Disp: 6 each, Rfl: 1   cyclobenzaprine (FLEXERIL) 5 MG tablet, Take 1 tablet (5 mg total) by mouth at bedtime., Disp: 30 tablet, Rfl: 0   diclofenac Sodium (VOLTAREN) 1 % GEL, Apply 2 g topically 4 (four) times daily., Disp: 100 g, Rfl: 2   diltiazem (CARDIZEM CD) 180 MG 24 hr capsule, Take 1 capsule (180 mg total) by mouth daily., Disp: 90 capsule, Rfl: 1   empagliflozin (JARDIANCE) 25 MG TABS tablet, Take 1 tablet (25 mg total) by mouth daily before breakfast., Disp: 90 tablet, Rfl: 0   ezetimibe (ZETIA) 10 MG tablet, Take 1 tablet (10 mg total) by mouth daily., Disp: 90 tablet, Rfl: 3   famotidine (PEPCID) 20 MG tablet, Take 1 tablet (20 mg total) by mouth 2 (two) times  daily., Disp: 180 tablet, Rfl: 1   fluticasone (FLONASE) 50 MCG/ACT nasal spray, 2 sprays each nostril Daily. DISP#  1 bottles = 1 month supply., Disp: , Rfl:    gabapentin (NEURONTIN) 300 MG capsule, Take 1 capsule (300 mg total) by mouth 3 (three) times daily., Disp: 270 capsule, Rfl: 1   glucose blood test strip, Use as instructed, Disp: 100 each, Rfl: 12   ipratropium-albuterol (DUONEB) 0.5-2.5 (3) MG/3ML SOLN, Take 3 mLs by nebulization every 6 (six) hours as needed., Disp: 360 mL, Rfl: 1   loratadine (CLARITIN) 10 MG tablet, Take 1 tablet (10 mg total) by mouth daily., Disp: 90 tablet, Rfl: 1   magnesium oxide (MAG-OX) 400 MG tablet, Take 1 tablet (400 mg total) by mouth 2 (two) times daily., Disp: 180 tablet, Rfl: 1   meclizine (ANTIVERT) 25 MG tablet, Take 1 tablet (25 mg total) by mouth 3 (three)  times daily as needed for dizziness., Disp: 30 tablet, Rfl: 0   metoprolol succinate (TOPROL-XL) 25 MG 24 hr tablet, Take by mouth., Disp: , Rfl:    Microlet Lancets MISC, USE TO CHECK BLOOD SUGAR ONCE D, Disp: , Rfl:    montelukast (SINGULAIR) 10 MG tablet, Take by mouth., Disp: , Rfl:    nystatin (MYCOSTATIN) 100000 UNIT/ML suspension, Take by mouth., Disp: , Rfl:    ondansetron (ZOFRAN-ODT) 4 MG disintegrating tablet, Take 1 tablet (4 mg total) by mouth every 8 (eight) hours as needed for nausea., Disp: 30 tablet, Rfl: 0   pantoprazole (PROTONIX) 40 MG tablet, Take 40 mg by mouth 2 (two) times daily., Disp: , Rfl:    predniSONE (DELTASONE) 10 MG tablet, Take 10 mg by mouth daily with breakfast. 60 mg daily, plan for 7-days then taper by 10mg  every 7 days., Disp: , Rfl:    promethazine (PHENERGAN) 25 MG tablet, Take 1 tablet (25 mg total) by mouth every 8 (eight) hours as needed for nausea or vomiting., Disp: 20 tablet, Rfl: 0   rosuvastatin (CRESTOR) 40 MG tablet, TAKE 1 TABLET(40 MG) BY MOUTH DAILY, Disp: 90 tablet, Rfl: 3   Semaglutide,0.25 or 0.5MG /DOS, (OZEMPIC, 0.25 OR 0.5 MG/DOSE,) 2  MG/3ML SOPN, Inject 0.5 mg into the skin once a week., Disp: 6 mL, Rfl: 0   sodium chloride 0.9 % nebulizer solution, Inhale into the lungs., Disp: , Rfl:    Spacer/Aero-Holding Chambers (OPTICHAMBER DIAMOND) MISC, , Disp: , Rfl:    sulfamethoxazole-trimethoprim (BACTRIM DS) 800-160 MG tablet, Take 1 tablet by mouth daily. MWF bactrim PJP prophylaxis, Disp: , Rfl:    traZODone (DESYREL) 50 MG tablet, TAKE 1 TABLET(50 MG) BY MOUTH AT BEDTIME, Disp: 90 tablet, Rfl: 0   triamcinolone cream (KENALOG) 0.1 %, Apply 1 Application topically 2 (two) times daily., Disp: 453.6 g, Rfl: 0   Ubrogepant (UBRELVY) 100 MG TABS, Take 1 tablet (100 mg total) by mouth daily as needed., Disp: 16 tablet, Rfl: 0   benralizumab (FASENRA PEN) 30 MG/ML prefilled autoinjector, Inject into the skin. (Patient not taking: Reported on 02/24/2024), Disp: , Rfl:   Allergies  Allergen Reactions   Ace Inhibitors Swelling    Angioedema    Lisinopril Swelling    Face and neck swelling    I personally reviewed active problem list, medication list, allergies, family history with the patient/caregiver today.   ROS  Ten systems reviewed and is negative except as mentioned in HPI    Objective  Vitals:   02/24/24 1047  BP: 112/74  Pulse: 77  Resp: 16  SpO2: 98%  Weight: 168 lb 12.8 oz (76.6 kg)  Height: 5' (1.524 m)    Body mass index is 32.97 kg/m.  Physical Exam  Constitutional: Patient appears well-developed and well-nourished. Obese  No distress.  HEENT: head atraumatic, normocephalic, pupils equal and reactive to light, neck supple Cardiovascular: Normal rate, regular rhythm and normal heart sounds.  No murmur heard. No BLE edema. Pulmonary/Chest: Effort normal , in and expiratory wheezing.  No respiratory distress. Abdominal: Soft.  There is no tenderness. Psychiatric: Patient has a normal mood and affect. behavior is normal. Judgment and thought content normal.   Recent Results (from the past 2160 hours)   Nitric oxide     Status: None   Collection Time: 12/16/23  4:09 PM  Result Value Ref Range   Nitric Oxide 163   POCT Glucose (CBG)     Status: Abnormal   Collection Time: 01/14/24  9:56 AM  Result Value Ref Range   POC Glucose 162 (A) 70 - 99 mg/dl  Lipid panel     Status: Abnormal   Collection Time: 01/18/24 12:15 PM  Result Value Ref Range   Cholesterol 179 <200 mg/dL   HDL 92 > OR = 50 mg/dL   Triglycerides 161 (H) <150 mg/dL    Comment: . If a non-fasting specimen was collected, consider repeat triglyceride testing on a fasting specimen if clinically indicated.  Perry Mount et al. J. of Clin. Lipidol. 2015;9:129-169. Marland Kitchen    LDL Cholesterol (Calc) 60 mg/dL (calc)    Comment: Reference range: <100 . Desirable range <100 mg/dL for primary prevention;   <70 mg/dL for patients with CHD or diabetic patients  with > or = 2 CHD risk factors. Marland Kitchen LDL-C is now calculated using the Martin-Hopkins  calculation, which is a validated novel method providing  better accuracy than the Friedewald equation in the  estimation of LDL-C.  Horald Pollen et al. Lenox Ahr. 0960;454(09): 2061-2068  (http://education.[employer].com/faq/FAQ164)    Total CHOL/HDL Ratio 1.9 <5.0 (calc)   Non-HDL Cholesterol (Calc) 87 <811 mg/dL (calc)    Comment: For patients with diabetes plus 1 major ASCVD risk  factor, treating to a non-HDL-C goal of <100 mg/dL  (LDL-C of <91 mg/dL) is considered a therapeutic  option.   Microalbumin / creatinine urine ratio     Status: Abnormal   Collection Time: 01/18/24 12:15 PM  Result Value Ref Range   Creatinine, Urine 135 20 - 275 mg/dL   Microalb, Ur 9.6 mg/dL    Comment: Reference Range Not established    Microalb Creat Ratio 71 (H) <30 mg/g creat    Comment: . The ADA defines abnormalities in albumin excretion as follows: Marland Kitchen Albuminuria Category        Result (mg/g creatinine) . Normal to Mildly increased   <30 Moderately increased         30-299  Severely  increased           > OR = 300 . The ADA recommends that at least two of three specimens collected within a 3-6 month period be abnormal before considering a patient to be within a diagnostic category.   CBC with Differential/Platelet     Status: Abnormal   Collection Time: 01/18/24 12:15 PM  Result Value Ref Range   WBC 10.7 3.8 - 10.8 Thousand/uL   RBC 4.96 3.80 - 5.10 Million/uL   Hemoglobin 13.7 11.7 - 15.5 g/dL   HCT 47.8 29.5 - 62.1 %   MCV 84.5 80.0 - 100.0 fL   MCH 27.6 27.0 - 33.0 pg   MCHC 32.7 32.0 - 36.0 g/dL    Comment: For adults, a slight decrease in the calculated MCHC value (in the range of 30 to 32 g/dL) is most likely not clinically significant; however, it should be interpreted with caution in correlation with other red cell parameters and the patient's clinical condition.    RDW 15.0 11.0 - 15.0 %   Platelets 138 (L) 140 - 400 Thousand/uL   MPV 11.3 7.5 - 12.5 fL   Neutro Abs 7,404 1,500 - 7,800 cells/uL   Absolute Lymphocytes 2,279 850 - 3,900 cells/uL   Absolute Monocytes 610 200 - 950 cells/uL   Eosinophils Absolute 342 15 - 500 cells/uL   Basophils Absolute 64 0 - 200 cells/uL   Neutrophils Relative % 69.2 %   Total Lymphocyte 21.3 %   Monocytes Relative 5.7 %   Eosinophils Relative  3.2 %   Basophils Relative 0.6 %   Smear Review      Comment: Review of peripheral smear confirms automated results.   COMPLETE METABOLIC PANEL WITH GFR     Status: Abnormal   Collection Time: 01/18/24 12:15 PM  Result Value Ref Range   Glucose, Bld 189 (H) 65 - 99 mg/dL    Comment: .            Fasting reference interval . For someone without known diabetes, a glucose value >125 mg/dL indicates that they may have diabetes and this should be confirmed with a follow-up test. .    BUN 15 7 - 25 mg/dL   Creat 6.62 (H) 9.47 - 1.05 mg/dL   eGFR 41 (L) > OR = 60 mL/min/1.59m2   BUN/Creatinine Ratio 10 6 - 22 (calc)   Sodium 139 135 - 146 mmol/L   Potassium 3.7  3.5 - 5.3 mmol/L   Chloride 103 98 - 110 mmol/L   CO2 26 20 - 32 mmol/L   Calcium 9.2 8.6 - 10.4 mg/dL   Total Protein 6.9 6.1 - 8.1 g/dL   Albumin 4.1 3.6 - 5.1 g/dL   Globulin 2.8 1.9 - 3.7 g/dL (calc)   AG Ratio 1.5 1.0 - 2.5 (calc)   Total Bilirubin 0.5 0.2 - 1.2 mg/dL   Alkaline phosphatase (APISO) 121 37 - 153 U/L   AST 27 10 - 35 U/L   ALT 42 (H) 6 - 29 U/L  CK (Creatine Kinase)     Status: None   Collection Time: 01/18/24 12:15 PM  Result Value Ref Range   Total CK 127 20 - 243 U/L  BASIC METABOLIC PANEL WITH GFR     Status: Abnormal   Collection Time: 02/02/24  8:08 AM  Result Value Ref Range   Glucose, Bld 87 65 - 99 mg/dL    Comment: .            Fasting reference interval .    BUN 5 (L) 7 - 25 mg/dL    Comment: Verified by repeat analysis. .    Creat 1.39 (H) 0.50 - 1.05 mg/dL    Comment: Verified by repeat analysis. Marland Kitchen    eGFR 42 (L) > OR = 60 mL/min/1.61m2   BUN/Creatinine Ratio 4 (L) 6 - 22 (calc)   Sodium 143 135 - 146 mmol/L   Potassium 3.8 3.5 - 5.3 mmol/L   Chloride 106 98 - 110 mmol/L   CO2 28 20 - 32 mmol/L   Calcium 9.3 8.6 - 10.4 mg/dL    Diabetic Foot Exam:     PHQ2/9:    02/04/2024   10:09 AM 01/18/2024   11:27 AM 12/09/2023   11:21 AM 11/18/2023    9:23 AM 09/15/2023    9:26 AM  Depression screen PHQ 2/9  Decreased Interest 0 0 0 0 0  Down, Depressed, Hopeless 0 0 0 0 0  PHQ - 2 Score 0 0 0 0 0  Altered sleeping 0 0  0 0  Tired, decreased energy 0 0  0 0  Change in appetite 0 0  0 0  Feeling bad or failure about yourself  0 0  0 0  Trouble concentrating 0 0  0 0  Moving slowly or fidgety/restless 0 0  0 0  Suicidal thoughts 0 0  0 0  PHQ-9 Score 0 0  0 0  Difficult doing work/chores Not difficult at all Not difficult at all   Not  difficult at all    phq 9 is negative  Fall Risk:    01/18/2024   11:17 AM 11/18/2023    9:23 AM 09/15/2023    9:26 AM 09/09/2023    9:37 AM 09/08/2023    9:50 AM  Fall Risk   Falls in the past  year? 0 0 0 0 0  Number falls in past yr: 0  0 0 0  Injury with Fall? 0  0 0 0  Risk for fall due to : No Fall Risks No Fall Risks No Fall Risks No Fall Risks No Fall Risks  Follow up Falls prevention discussed;Education provided;Falls evaluation completed Falls prevention discussed Falls prevention discussed;Education provided;Falls evaluation completed Falls prevention discussed;Education provided;Falls evaluation completed Falls prevention discussed     Assessment & Plan  1. Eosinophilic asthma (Primary)  Continue medications, has follow up with pulmonologist at [facility] March 17 th  Cardiologist evaluation on April 11 th   GI evaluation on April 14 th   2. Hospital discharge follow-up  Keep visits as scheduled   3. Hypertrophic cardiomyopathy (HCC)  Going to see cardiologist and has repeat studies scheduled at [facility]

## 2024-02-24 NOTE — Telephone Encounter (Signed)
 I spoke with patient and scheduled AWV video visit on 05/03/2024 at 10:40.

## 2024-02-25 ENCOUNTER — Other Ambulatory Visit: Payer: Self-pay | Admitting: Family Medicine

## 2024-02-25 DIAGNOSIS — I129 Hypertensive chronic kidney disease with stage 1 through stage 4 chronic kidney disease, or unspecified chronic kidney disease: Secondary | ICD-10-CM | POA: Diagnosis not present

## 2024-02-25 DIAGNOSIS — R1310 Dysphagia, unspecified: Secondary | ICD-10-CM | POA: Diagnosis not present

## 2024-02-25 DIAGNOSIS — I421 Obstructive hypertrophic cardiomyopathy: Secondary | ICD-10-CM | POA: Diagnosis not present

## 2024-02-25 DIAGNOSIS — J8283 Eosinophilic asthma: Secondary | ICD-10-CM | POA: Diagnosis not present

## 2024-02-25 DIAGNOSIS — J45901 Unspecified asthma with (acute) exacerbation: Secondary | ICD-10-CM | POA: Diagnosis not present

## 2024-02-25 DIAGNOSIS — G8929 Other chronic pain: Secondary | ICD-10-CM

## 2024-02-25 DIAGNOSIS — J189 Pneumonia, unspecified organism: Secondary | ICD-10-CM | POA: Diagnosis not present

## 2024-02-25 DIAGNOSIS — N183 Chronic kidney disease, stage 3 unspecified: Secondary | ICD-10-CM | POA: Diagnosis not present

## 2024-02-25 DIAGNOSIS — E1142 Type 2 diabetes mellitus with diabetic polyneuropathy: Secondary | ICD-10-CM | POA: Diagnosis not present

## 2024-02-25 DIAGNOSIS — E1122 Type 2 diabetes mellitus with diabetic chronic kidney disease: Secondary | ICD-10-CM | POA: Diagnosis not present

## 2024-02-28 ENCOUNTER — Other Ambulatory Visit: Payer: Self-pay | Admitting: Family Medicine

## 2024-02-28 DIAGNOSIS — G8929 Other chronic pain: Secondary | ICD-10-CM

## 2024-02-28 DIAGNOSIS — E1142 Type 2 diabetes mellitus with diabetic polyneuropathy: Secondary | ICD-10-CM

## 2024-02-28 NOTE — Telephone Encounter (Signed)
 Copied from CRM [id number]. Topic: Clinical - Medication Refill >> Feb 28, 2024 10:18 AM Alessandra Bevels wrote: Most Recent Primary Care Visit:  Provider: Berniece Salines  Department: [facility]  Visit Type: MYCHART VIDEO VISIT  Date: 01/27/2024  Medication: metoprolol succinate (TOPROL-XL) 25 MG 24 hr tablet [[id number]]  Historical Provider  Has the patient contacted their pharmacy? Yes (Agent: If no, request that the patient contact the pharmacy for the refill. If patient does not wish to contact the pharmacy document the reason why and proceed with request.) (Agent: If yes, when and what did the pharmacy advise?)  Is this the correct pharmacy for this prescription? Yes If no, delete pharmacy and type the correct one.  This is the patient's preferred pharmacy:  [id number] - [place], [place] - [address] AT [facility] OF [address] [place] [address] [place] [address] Phone: [phone] Fax: [phone]     Has the prescription been filled recently? Yes  Is the patient out of the medication? Yes  Has the patient been seen for an appointment in the last year OR does the patient have an upcoming appointment? Yes  Can we respond through MyChart? Yes  Agent: Please be advised that Rx refills may take up to 3 business days. We ask that you follow-up with your pharmacy.

## 2024-02-28 NOTE — Telephone Encounter (Signed)
 Copied from CRM [id number]. Topic: Clinical - Medication Refill >> Feb 28, 2024 10:22 AM Alessandra Bevels wrote: Most Recent Primary Care Visit:  Provider: Berniece Salines  Department: [facility]  Visit Type: MYCHART VIDEO VISIT  Date: 01/27/2024  Medication: gabapentin (NEURONTIN) 300 MG capsule [[id number]]  Has the patient contacted their pharmacy? Yes (Agent: If no, request that the patient contact the pharmacy for the refill. If patient does not wish to contact the pharmacy document the reason why and proceed with request.) (Agent: If yes, when and what did the pharmacy advise?)  Is this the correct pharmacy for this prescription? Yes If no, delete pharmacy and type the correct one.  This is the patient's preferred pharmacy:  [id number] - [place], [place] - [address] AT [facility] OF [address] [place] [address] [place] [address] Phone: [phone] Fax: [phone]     Has the prescription been filled recently? Yes  Is the patient out of the medication? No  Has the patient been seen for an appointment in the last year OR does the patient have an upcoming appointment? No  Can we respond through MyChart? No  Agent: Please be advised that Rx refills may take up to 3 business days. We ask that you follow-up with your pharmacy.

## 2024-02-29 MED ORDER — METOPROLOL SUCCINATE ER 25 MG PO TB24: 25.0000 mg | ORAL_TABLET | Freq: Every day | ORAL | 0 refills | Status: DC

## 2024-02-29 NOTE — Telephone Encounter (Signed)
 Requested medication (s) are due for refill today: No  Requested medication (s) are on the active medication list: Yes  Last refill:  Gabapentin 02/28/24  Future visit scheduled:   Notes to clinic:  Toprol - historical provider.    Requested Prescriptions  Pending Prescriptions Disp Refills   metoprolol succinate (TOPROL-XL) 25 MG 24 hr tablet      Sig: Take by mouth.     There is no refill protocol information for this order     gabapentin (NEURONTIN) 300 MG capsule 270 capsule 1    Sig: Take 1 capsule (300 mg total) by mouth 3 (three) times daily.     There is no refill protocol information for this order

## 2024-03-01 ENCOUNTER — Other Ambulatory Visit: Payer: Self-pay

## 2024-03-01 ENCOUNTER — Ambulatory Visit: Payer: Self-pay | Admitting: Family Medicine

## 2024-03-01 ENCOUNTER — Encounter: Payer: Self-pay | Admitting: Family Medicine

## 2024-03-01 DIAGNOSIS — J189 Pneumonia, unspecified organism: Secondary | ICD-10-CM | POA: Diagnosis not present

## 2024-03-01 DIAGNOSIS — D573 Sickle-cell trait: Secondary | ICD-10-CM

## 2024-03-01 DIAGNOSIS — N183 Chronic kidney disease, stage 3 unspecified: Secondary | ICD-10-CM | POA: Diagnosis not present

## 2024-03-01 DIAGNOSIS — J45901 Unspecified asthma with (acute) exacerbation: Secondary | ICD-10-CM | POA: Diagnosis not present

## 2024-03-01 DIAGNOSIS — E1122 Type 2 diabetes mellitus with diabetic chronic kidney disease: Secondary | ICD-10-CM

## 2024-03-01 DIAGNOSIS — J8283 Eosinophilic asthma: Secondary | ICD-10-CM | POA: Diagnosis not present

## 2024-03-01 DIAGNOSIS — I421 Obstructive hypertrophic cardiomyopathy: Secondary | ICD-10-CM | POA: Diagnosis not present

## 2024-03-01 DIAGNOSIS — G8929 Other chronic pain: Secondary | ICD-10-CM

## 2024-03-01 DIAGNOSIS — M62838 Other muscle spasm: Secondary | ICD-10-CM

## 2024-03-01 DIAGNOSIS — K219 Gastro-esophageal reflux disease without esophagitis: Secondary | ICD-10-CM

## 2024-03-01 DIAGNOSIS — M1990 Unspecified osteoarthritis, unspecified site: Secondary | ICD-10-CM

## 2024-03-01 DIAGNOSIS — E1142 Type 2 diabetes mellitus with diabetic polyneuropathy: Secondary | ICD-10-CM | POA: Diagnosis not present

## 2024-03-01 DIAGNOSIS — I129 Hypertensive chronic kidney disease with stage 1 through stage 4 chronic kidney disease, or unspecified chronic kidney disease: Secondary | ICD-10-CM | POA: Diagnosis not present

## 2024-03-01 DIAGNOSIS — R1310 Dysphagia, unspecified: Secondary | ICD-10-CM | POA: Diagnosis not present

## 2024-03-01 DIAGNOSIS — E1149 Type 2 diabetes mellitus with other diabetic neurological complication: Secondary | ICD-10-CM

## 2024-03-01 NOTE — Patient Outreach (Addendum)
 Care Management  Transitions of Care Program Transitions of Care Post-discharge week 2   03/01/2024 Name: Brandy Johnston MRN: [id number] DOB: 1961/09/05  Subjective: Brandy Johnston is a 63 y.o. year old female who is a primary care patient of Alba Cory, MD. The Care Management team Engaged with patient Engaged with patient by telephone to assess and address transitions of care needs.   Consent to Services:  Patient was given information about care management services, agreed to services, and gave verbal consent to participate.   Assessment:   Patient/ Caregiver  voices no new complaints or concerns  and has not developed/ reported any new medical issues / Dx or acute changes. - since last follow-up call for most recent  Hospital stay    2/19-2/24/ 2025  No reported or reviewed Hospital Readmissions / No Urgent Care Visits / Transfers for Acute Change in condition .   MD/Specialist or Consultant visits reported since last follow-up call- PCP visit 2/27  She will see Neurology 3/11 and Pulmonology 3/17   Patient was Alert & O x 4 ,responsive, able to voice needs, cooperative with visit   There were no noted or reported cognitive/ mental status changes during this assessment Patient denies changes in ADL function or IADLs  at this time.  No reports of new onset or increased / uncontrolled pain, has chronic pain managed with medicinal and non-medicinal interventions.  No changes in skin, no rashes, wounds or open areas  Diet reviewed and  education provided . Her appetite is good and she has adequate fluid intake  Therapy Services  SN  / PT  /  OT  continue with [facility] SN service include Med Management Will submit Amb referral for Pharmacy due to poly pharmacy( > 35 meds) and she has expressed in compliance packaging   Medication reconciliation/ review completed based on most recent medication list in EHR; and in review of recent Provider follow-up appointments- confirmed patient obtained /  is taking all newly prescribed medications as instructed and is aware of any changes to previous medications regimen including dosage adjustments.  Reports  no new changes in medication/treatment  regimen  during this assessment period.   Medications and current treatments are effective for use intended. No adverse Drug reactions  reported No changes or modifications indicated at this time.   Patient self- manages  medications Patient is able to take medications without difficulty;  denies questions/ concerns and reports no barriers to medication adherence at this time   Patient  educated on red flag s/s to watch for based on current discharge diagnosis and was encouraged to report, any changes in baseline or  medication regimen,   or any new unmanaged side effects or symptoms not relieved with interventions  to PCP and / or the  [facility] Case Management team .          SDOH Interventions    Flowsheet Row Telephone from 02/22/2024 in [facility] HEALTH POPULATION HEALTH DEPARTMENT Office Visit from 12/09/2022 in [facility] Office Visit from 01/15/2021 in [facility]  SDOH Interventions     Food Insecurity Interventions Intervention Not Indicated Intervention Not Indicated --  Housing Interventions Intervention Not Indicated Intervention Not Indicated Intervention Not Indicated  Transportation Interventions Intervention Not Indicated, Patient Resources (Friends/Family), Payor Benefit Intervention Not Indicated Intervention Not Indicated  Utilities Interventions Intervention Not Indicated Intervention Not Indicated --  Alcohol Usage Interventions -- Intervention Not Indicated (Score <7) --  Financial Strain  Interventions -- Intervention Not Indicated Other (Comment)   [doing better now that has disability]  Stress Interventions -- Intervention Not Indicated Intervention Not Indicated  Social Connections Interventions -- Intervention Not Indicated --         Goals Addressed             This Visit's Progress    TOC Care Plan       Current Barriers:  Medication management multiple changes  Provider appointments PCP Pulmonology Cardiology Transportation Uses Benefits, [facility] Public and Family Friends  Chronic Disease Management support and education needs related to DMII and Pulmonary Disease   RNCM Clinical Goal(s):  Patient will work with the Care Management team over the next 30 days to address Transition of Care Barriers: Medication Management Support at home Provider appointments take all medications exactly as prescribed and will call provider for medication related questions as evidenced by no missed medication doses attend all scheduled medical appointments: as evidenced by no missed follow-up visits  through collaboration with RN Care manager, provider, and care team.   Interventions: Evaluation of current treatment plan related to  self management and patient's adherence to plan as established by provider  Transitions of Care:  Goal on track:  Yes. Doctor Visits  - discussed the importance of doctor visits [facility] RN/OT/PT  Post discharge activity limitations prescribed by provider reviewed Reviewed Signs and symptoms of infection  Asthma: (Status:Goal on track:  Yes.) Short Term Goal Provided instruction about proper use of medications used for management of Asthma including inhalers Provided education about and advised patient to utilize infection prevention strategies to reduce risk of respiratory infection Discussed the importance of adequate rest and management of fatigue with Asthma Screening for signs and symptoms of depression related to chronic disease state  Assessed social determinant of health barriers   Patient Goals/Self-Care Activities: Participate in Transition of Care Program/Attend TOC scheduled calls Take all medications as prescribed Attend all scheduled provider appointments Call  pharmacy for medication refills 3-7 days in advance of running out of medications Perform all self care activities independently  Perform IADL's (shopping, preparing meals, housekeeping, managing finances) independently Call provider office for new concerns or questions   Follow Up Plan:  Telephone follow up appointment with care management team member scheduled for:  03/08/24 @ 10:00am The patient has been provided with contact information for the care management team and has been advised to call with any health related questions or concerns.          Plan: The patient has been provided with contact information for the care management team and has been advised to call with any health related questions or concerns.   Routine follow-up and on-going assessment evaluation and education of disease processes, recommended interventions for both chronic and acute medical conditions , will occur during each weekly visit along with ongoing review of symptoms ,medication reviews and reconciliation. Any updates , inconsistencies, discrepancies or acute care concerns will be addressed and routed to the correct Practitioner if indicated   Based on current information and Insurance plan -Reviewed benefits available to patient, including details about eligibility options for care if any area of needs were identified.  Reviewed patients ability to access and / or navigating the benefits system..Amb Referral made if indicted , refer to orders section of note for details   Please refer to Care Plan for goals and interventions -Effectiveness of interventions, symptom management and outcomes will be evaluated  weekly during [facility] 30-day Program  Outreach calls  . Any necessary  changes and updates to Care Plan will be completed episodically    Reviewed goals for care Patient verbalizes understanding of instructions and care plan provided. Patient was encouraged to make informed decisions about their care, actively  participate in managing their health condition, and implement lifestyle changes as needed to promote independence and self-management of health care   Patient was encouraged to Contact PCP with any changes in baseline or  medication regimen,  changes in health status  /  well-being, safety concerns, including falls any questions or concerns regarding ongoing medical care, any difficulty obtaining or picking up prescriptions, any changes or worsening in condition- including  symptoms not relieved  with interventions    The patient has been provided with contact information for the care management team and has been advised to call with any health-related questions or concerns. Follow up as indicated with Care Team , or sooner should any new problems arise.   Susa Loffler , BSN, RN [facility]   VBCI-Population Health RN Care Manager Direct Dial [phone]  Fax: [phone] Website: [email or web address]

## 2024-03-01 NOTE — Telephone Encounter (Signed)
  Chief Complaint: Medication Question  Additional Notes: Spoke with patient to verify how to take metoprolol medication. Per MAR, medication directions say to take 1 whole 25mg  pill, but per chart review last visit with PCP on 02/24/24, states to "continue taking only 12.5 mg of metoprolol", which patient states she was taking prior to new script. Advised patient to continue taking 12.5 dose (which is her normal amount that she takes) at this time and this RN will get further verification from PCP.    Copied from CRM [id number]. Topic: Clinical - Medical Advice >> Mar 01, 2024  4:13 PM Everette C wrote: Reason for CRM: The patient would like to be contacted by a member of clinical staff when possible to confirm the directions and dose of their prescription for metoprolol succinate (TOPROL-XL) 25 MG 24 hr tablet [[id number]] Reason for Disposition  [1] Caller has NON-URGENT medicine question about med that PCP prescribed AND [2] triager unable to answer question  Answer Assessment - Initial Assessment Questions 1. NAME of MEDICINE: "What medicine(s) are you calling about?"     Spoke with patient to verify how to take metoprolol medication. Per MAR, medication directions say to take 1 whole 25mg  pill, but per chart review last visit with PCP on 02/24/24, states to continue taking only 12.5 mg of metoprolol, which patient states she was taking previously. Advised patient to continue taking half (which is her normal) at this time and this RN will get further verification from PCP.  Protocols used: Medication Question Call-A-AH

## 2024-03-01 NOTE — Patient Instructions (Signed)
 Visit Information  Thank you for taking time to visit with me today. Please don't hesitate to contact me if I can be of assistance to you before our next scheduled telephone appointment.  Our next appointment is by telephone on 03/08/24 at 12N  Following is a copy of your care plan:   Goals Addressed             This Visit's Progress    TOC Care Plan       Current Barriers:  Medication management multiple changes  Provider appointments PCP Pulmonology Cardiology Transportation Uses Benefits, [facility] Public and Family Friends  Chronic Disease Management support and education needs related to DMII and Pulmonary Disease   RNCM Clinical Goal(s):  Patient will work with the Care Management team over the next 30 days to address Transition of Care Barriers: Medication Management Support at home Provider appointments take all medications exactly as prescribed and will call provider for medication related questions as evidenced by no missed medication doses attend all scheduled medical appointments: as evidenced by no missed follow-up visits  through collaboration with RN Care manager, provider, and care team.   Interventions: Evaluation of current treatment plan related to  self management and patient's adherence to plan as established by provider  Transitions of Care:  Goal on track:  Yes. Doctor Visits  - discussed the importance of doctor visits [facility] RN/OT/PT  Post discharge activity limitations prescribed by provider reviewed Reviewed Signs and symptoms of infection  Asthma: (Status:Goal on track:  Yes.) Short Term Goal Provided instruction about proper use of medications used for management of Asthma including inhalers Provided education about and advised patient to utilize infection prevention strategies to reduce risk of respiratory infection Discussed the importance of adequate rest and management of fatigue with Asthma Screening for signs and symptoms of depression  related to chronic disease state  Assessed social determinant of health barriers   Patient Goals/Self-Care Activities: Participate in Transition of Care Program/Attend TOC scheduled calls Take all medications as prescribed Attend all scheduled provider appointments Call pharmacy for medication refills 3-7 days in advance of running out of medications Perform all self care activities independently  Perform IADL's (shopping, preparing meals, housekeeping, managing finances) independently Call provider office for new concerns or questions   Follow Up Plan:  Telephone follow up appointment with care management team member scheduled for:  03/08/24 @ 12 N  The patient has been provided with contact information for the care management team and has been advised to call with any health related questions or concerns.          Medication review  Reviewed current home medications -- provided education as needed. Patient is aware of potential side effects and was encouraged to notify PCP for any adverse side effects or unwanted symptoms not relieved with interventions  Patient will call 911 for Medical Emergencies or Life -Threatening Symptoms.  Reviewed goals for care Patient/ Caregiver verbalizes understanding of instructions with the plan of care . The  Patient / Caregiver was encouraged to make informed decisions about care, actively participate in managing health conditions, and implement lifestyle changes as needed to promote independence and self-management of healthcare. SDOH screenings have been completed and addressed if indicted.  There are no reported barriers to care.    Follow-up Plan [facility]BCI Case Management Nurse will provide follow-up and on-going assessment ,evaluation and education of disease processes, recommended interventions for both chronic and acute medical conditions ,  along with ongoing review of  symptoms ,medication reviews / reconciliation during each weekly call . Any  updates , inconsistencies, discrepancies or acute care concerns will be addressed and routed to the correct Practitioner if indicated   [facility]  Please call the care guide team at [phone]  if you need to cancel or reschedule your appointment . For scheduled calls -Three attempts will be made to reach you -if the scheduled call is missed or  we are unable to reach the you after 3 attempts no additional outreach attempts will be made and the TOC follow-up will be closed .   If you need to speak to a Nurse you may  call me directly at the number below or if I am unavailable,and  your need is urgent  please call the main [facility]I number at [phone] and ask to speak with one of the [facility] ( Transition of Care )  Nurses  .  Patient was encouraged to Contact PCP with any changes in baseline or  medication regimen,  changes in health status  /  well-being, safety concerns, including falls any questions or concerns regarding ongoing medical care, any difficulty obtaining or picking up prescriptions, any changes or worsening in condition- including  symptoms not relieved  with interventions                                                                            Additionally, If you experience worsening of your symptoms, develop shortness of breath, If you are experiencing a medical emergency,  develop suicidal or homicidal thoughts you must seek medical attention immediately by calling 911 or report to your local emergency department or urgent care.   If you have a non-emergency medical problem during routine business hours, please contact your provider's office and ask to speak with a nurse.       Please take the time to read instructions/literature along with the possible adverse reactions/side effects for all the Medicines that have been prescribed to you. Only take newly prescribed  Medications after you have completely understood and accept all the possible adverse reactions/side  effects.   Do not take more than prescribed Medications for  Pain, Sleep and Anxiety. Do not drive when taking Pain medications or sleep aid/ insomnia  medications It is not advisable to combine anxiety, sleep and pain medications without talking with your primary care practitioner    If you are experiencing a Mental Health or Behavioral Health Crisis or need someone to talk to Please call the Suicide and Crisis Lifeline: 988 You may also call the [place] National Suicide Prevention Lifeline: 4310884997 or TTY: (234) 318-3611 TTY 250-233-8929) to talk to a trained counselor.  You may call the Behavioral Health Crisis Line at 406 558 0649, at any time, 24 hours a day, 7 days a week- however If you are in danger or need immediate medical attention, call 911.   If you would like help to quit smoking, call 1-800-QUIT-NOW ( (201) 311-5915) OR Espaol: 1-855-Djelo-Ya (7-564-332-9518) o para ms informacin haga clic aqu or Text READY to 841-660 to register via text.   Susa Loffler , BSN, RN [place]   VBCI-Population Health RN Care Manager Direct Dial [phone]  Website: [email or web address]

## 2024-03-02 DIAGNOSIS — I421 Obstructive hypertrophic cardiomyopathy: Secondary | ICD-10-CM | POA: Diagnosis not present

## 2024-03-02 DIAGNOSIS — R1310 Dysphagia, unspecified: Secondary | ICD-10-CM | POA: Diagnosis not present

## 2024-03-02 DIAGNOSIS — I129 Hypertensive chronic kidney disease with stage 1 through stage 4 chronic kidney disease, or unspecified chronic kidney disease: Secondary | ICD-10-CM | POA: Diagnosis not present

## 2024-03-02 DIAGNOSIS — N183 Chronic kidney disease, stage 3 unspecified: Secondary | ICD-10-CM | POA: Diagnosis not present

## 2024-03-02 DIAGNOSIS — E1122 Type 2 diabetes mellitus with diabetic chronic kidney disease: Secondary | ICD-10-CM | POA: Diagnosis not present

## 2024-03-02 DIAGNOSIS — J8283 Eosinophilic asthma: Secondary | ICD-10-CM | POA: Diagnosis not present

## 2024-03-02 DIAGNOSIS — J45901 Unspecified asthma with (acute) exacerbation: Secondary | ICD-10-CM | POA: Diagnosis not present

## 2024-03-02 DIAGNOSIS — J189 Pneumonia, unspecified organism: Secondary | ICD-10-CM | POA: Diagnosis not present

## 2024-03-02 DIAGNOSIS — E1142 Type 2 diabetes mellitus with diabetic polyneuropathy: Secondary | ICD-10-CM | POA: Diagnosis not present

## 2024-03-03 ENCOUNTER — Ambulatory Visit: Payer: Self-pay | Admitting: Family Medicine

## 2024-03-03 ENCOUNTER — Other Ambulatory Visit: Payer: Self-pay | Admitting: Family Medicine

## 2024-03-03 NOTE — Addendum Note (Signed)
 Addended by: Forde Radon on: 03/03/2024 01:25 PM   Modules accepted: Orders

## 2024-03-03 NOTE — Telephone Encounter (Signed)
  Chief Complaint: White patches in throat Symptoms: above Frequency: 3 days - but has had in the past Pertinent Negatives: Patient denies any other s/s Disposition: [] ED /[] Urgent Care (no appt availability in office) / [] Appointment(In office/virtual)/ []  [facility]/ [] Home Care/ [x] Refused Recommended Disposition /[] [facility]/ []  Follow-up with PCP Additional Notes: Pt has white patches in her throat. She believes that this is thrush, which she has had in the past. Pt is currently on prednisone. Pt would like nystatin (MYCOSTATIN) 100000 UNIT/ML suspension [[id number]] called into pharmacy for this. Pt had this prescribed by a different provider in the past. Pt would prefer not to come into the office. Please advise.    Copied from CRM [id number]. Topic: Clinical - Medical Advice >> Mar 03, 2024  8:04 AM Patsy Lager T wrote: Reason for CRM: patient called stated she has possible thrash in the back of her mouth and need provider to prescribe a medication. Please f/u with patient Reason for Disposition  [1] White patches that stick to tongue or inner cheek AND [2] can be wiped off  Answer Assessment - Initial Assessment Questions 1. SYMPTOM: "What's the main symptom you're concerned about?" (e.g., chapped lips, dry mouth, lump, sores)     White in back of throat 2. ONSET: "When did the  s/s  start?"     3 days 3. PAIN: "Is there any pain?" If Yes, ask: "How bad is it?" (Scale: 1-10; mild, moderate, severe)   - MILD (1-3):  doesn't interfere with eating or normal activities   - MODERATE (4-7): interferes with eating some solids and normal activities   - SEVERE (8-10):  excruciating pain, interferes with most normal activities   - SEVERE DYSPHAGIA: can't swallow liquids, drooling     Mild soreness 4. CAUSE: "What do you think is causing the symptoms?"     Thrush 5. OTHER SYMPTOMS: "Do you have any other symptoms?" (e.g., fever, sore throat, toothache, swelling)      no  Protocols used: Mouth Symptoms-A-AH

## 2024-03-03 NOTE — Telephone Encounter (Signed)
 Teed up

## 2024-03-06 ENCOUNTER — Ambulatory Visit: Payer: Self-pay | Admitting: Family Medicine

## 2024-03-06 ENCOUNTER — Other Ambulatory Visit: Payer: Self-pay | Admitting: Family Medicine

## 2024-03-06 MED ORDER — NYSTATIN 100000 UNIT/ML MT SUSP: 5.0000 mL | Freq: Four times a day (QID) | OROMUCOSAL | 1 refills | Status: DC

## 2024-03-06 NOTE — Telephone Encounter (Signed)
 Patient called in checking on Nystatin refill, stating she has checked in a few times and been told it is ready to be filled. Patient stating the thrush in her mouth is getting worse. This RN checked with [facility] and nurse stated Dr. Carlynn Purl is working on refills now and patient should have it momentarily called in. Advised patient of this.   Copied from CRM [id number]. Topic: Clinical - Medication Refill >> Mar 06, 2024 10:35 AM Nyra Capes wrote: Most Recent Primary Care Visit:  Provider: Alba Cory  Department: [facility]  Visit Type: HOSPITAL FOLLOW UP  Date: 02/24/2024  Medication: nystatin (MYCOSTATIN) 100000 UNIT/ML suspension   Has the patient contacted their pharmacy? No (Agent: If no, request that the patient contact the pharmacy for the refill. If patient does not wish to contact the pharmacy document the reason why and proceed with request.) (Agent: If yes, when and what did the pharmacy advise?)  Is this the correct pharmacy for this prescription? Yes If no, delete pharmacy and type the correct one.  This is the patient's preferred pharmacy:  [id number] - [place], [place] - [address] AT [facility] OF [address] [place] [address] [place] [address] Phone: [phone] Fax: [phone]    Has the prescription been filled recently? Yes  Per patient in January  Is the patient out of the medication? Yes  Has the patient been seen for an appointment in the last year OR does the patient have an upcoming appointment? Yes, Provider in hospital prescribed medication, Patient was discharged form hospital in February  Can we respond through MyChart? Yes  Agent: Please be advised that Rx refills may take up to 3 business days. We ask that you follow-up with your pharmacy.  Patient has thrush for 5 days, and is getting worse >> Mar 06, 2024  2:57 PM Luther Parody E wrote: Patient called in, having hard time swallowing food and water, getting worse Additional  Information  Negative: Weak immune system (e.g., HIV positive, cancer chemo, splenectomy, organ transplant, chronic steroids)    Patient on steroids  Commented on: All Negative - Home Care    Follow up on Nystatin  Answer Assessment - Initial Assessment Questions 1. SYMPTOM: "What's the main symptom you're concerned about?" (e.g., chapped lips, dry mouth, lump, sores)     White stuff in back of throat, edge of lips, tongue 2. ONSET: "When did the  symptoms  start?"     For 5 days 3. PAIN: "Is there any pain?" If Yes, ask: "How bad is it?" (Scale: 1-10; mild, moderate, severe)   - MILD (1-3):  doesn't interfere with eating or normal activities   - MODERATE (4-7): interferes with eating some solids and normal activities   - SEVERE (8-10):  excruciating pain, interferes with most normal activities   - SEVERE DYSPHAGIA: can't swallow liquids, drooling     Causing sore throat  4. CAUSE: "What do you think is causing the symptoms?"     Patient was told it's due to prednisone 5. OTHER SYMPTOMS: "Do you have any other symptoms?" (e.g., fever, sore throat, toothache, swelling)     Not that I know of  Protocols used: Mouth Symptoms-A-AH

## 2024-03-06 NOTE — Telephone Encounter (Signed)
 Sent to [facility]- nystatin (MYCOSTATIN) 100000 UNIT/ML suspension 60 mL 1 03/06/2024 --   Sig - Route: Take 5 mLs (500,000 Units total) by mouth 4 (four) times daily. - Oral   Sent to pharmacy as: nystatin (MYCOSTATIN) 100000 UNIT/ML suspension   E-Prescribing Status: Receipt confirmed by pharmacy (03/06/2024  3:07 PM EDT)

## 2024-03-06 NOTE — Telephone Encounter (Signed)
 I had tee'd up for you

## 2024-03-06 NOTE — Addendum Note (Signed)
 Addended by: Forde Radon on: 03/06/2024 11:24 AM   Modules accepted: Orders

## 2024-03-06 NOTE — Telephone Encounter (Signed)
 Copied from CRM [id number]. Topic: Clinical - Medication Refill >> Mar 06, 2024 10:35 AM Nyra Capes wrote: Most Recent Primary Care Visit:  Provider: Alba Cory  Department: [facility]  Visit Type: HOSPITAL FOLLOW UP  Date: 02/24/2024  Medication: nystatin (MYCOSTATIN) 100000 UNIT/ML suspension   Has the patient contacted their pharmacy? No (Agent: If no, request that the patient contact the pharmacy for the refill. If patient does not wish to contact the pharmacy document the reason why and proceed with request.) (Agent: If yes, when and what did the pharmacy advise?)  Is this the correct pharmacy for this prescription? Yes If no, delete pharmacy and type the correct one.  This is the patient's preferred pharmacy:  [id number] - [place], [place] - [address] AT [facility] [address] [place] [address] Phone: [phone] Fax: [phone]    Has the prescription been filled recently? Yes  Per patient in January  Is the patient out of the medication? Yes  Has the patient been seen for an appointment in the last year OR does the patient have an upcoming appointment? Yes, Provider in hospital prescribed medication, Patient was discharged form hospital in February  Can we respond through MyChart? Yes  Agent: Please be advised that Rx refills may take up to 3 business days. We ask that you follow-up with your pharmacy.  Patient has thrush for 5 days, and is getting worse Reason for Disposition  [1] White patches that stick to tongue or inner cheek AND [2] can be wiped off  Answer Assessment - Initial Assessment Questions 1. SYMPTOM: "What's the main symptom you're concerned about?" (e.g., chapped lips, dry mouth, lump, sores)     I have thrush in my throat and tongue and I'm miserable.    I called in for a refill of this medication.   But I've not gotten a refill.   Nystatin Oral Solution.    2. ONSET: "When did the  thrush   start?"     I got it in the hospital in Feb.   They gave me medicine for it but it has come back and getting worse over the past 5 days.   I need a refill of it.    I called several days ago and never heard anything or got a refill called in. 3. PAIN: "Is there any pain?" If Yes, ask: "How bad is it?" (Scale: 1-10; mild, moderate, severe)   - MILD (1-3):  doesn't interfere with eating or normal activities   - MODERATE (4-7): interferes with eating some solids and normal activities   - SEVERE (8-10):  excruciating pain, interferes with most normal activities   - SEVERE DYSPHAGIA: can't swallow liquids, drooling     Severe.   I'm miserable.  4. CAUSE: "What do you think is causing the symptoms?"     Thrush  It has come back and is getting worse 5. OTHER SYMPTOMS: "Do you have any other symptoms?" (e.g., fever, sore throat, toothache, swelling)     It's on my tongue and my throat is raw and hurting so bad. 6. PREGNANCY: "Is there any chance you are pregnant?" "When was your last menstrual period?"     N/A due to age  Protocols used: Mouth Symptoms-A-AH  Chief Complaint: [name] has come back.   Had it when discharged from the hospital in Feb.  Given Nystatin Oral Solution.   Requesting a refill. Symptoms:  thrush went away but now is back and getting worse over the last 5 days.   She called in for a refill of this last week but did not hear anything back.   Frequency: Over the last 5 days and getting worse Pertinent Negatives: Patient denies anyone calling her back regarding a refill Disposition: [] ED /[] Urgent Care (no appt availability in office) / [] Appointment(In office/virtual)/ []  [place] [facility]/ [] Home Care/ [] Refused Recommended Disposition /[] [place] [facility]/ [x]  Follow-up with PCP Additional Notes: LOV 02/24/2024 since being in the hospital.   Has also done a video visit too.    Please send refill of Nystatin Oral Solution to [facility] in [place].   The Nystatin helped that  they gave her while in the hospital in Feb.

## 2024-03-07 ENCOUNTER — Other Ambulatory Visit

## 2024-03-07 ENCOUNTER — Telehealth: Payer: Self-pay

## 2024-03-07 NOTE — Progress Notes (Signed)
 Care Guide Pharmacy Note  03/07/2024 Name: Brandy Johnston MRN: [id number] DOB: 07-21-1961  Referred By: Alba Cory, MD Reason for referral: Care Coordination (Outreach to schedule with Pharm d )   [name] is a 63 y.o. year old female who is a primary care patient of Alba Cory, MD.  [name] was referred to the pharmacist for assistance related to: DMII  Successful contact was made with the patient to discuss pharmacy services including being ready for the pharmacist to call at least 5 minutes before the scheduled appointment time and to have medication bottles and any blood pressure readings ready for review. The patient agreed to meet with the pharmacist via telephone visit on (date/time).03/31/2024  Penne Lash , RMA     [place]  [facility], [facility] Guide  Direct Dial: [phone]  Website: [email or web address]

## 2024-03-07 NOTE — Progress Notes (Signed)
 Care Guide Pharmacy Note  03/07/2024 Name: Brandy Johnston MRN: [id number] DOB: 01-Jan-1961  Referred By: Alba Cory, MD Reason for referral: Care Coordination (Outreach to schedule with Pharm d )   Brandy Johnston is a 63 y.o. year old female who is a primary care patient of Alba Cory, MD.  Brandy Johnston was referred to the pharmacist for assistance related to: DMII  An unsuccessful telephone outreach was attempted today to contact the patient who was referred to the pharmacy team for assistance with medication management. Additional attempts will be made to contact the patient.  Penne Lash , RMA     [facility]  [facility], [facility] Guide  Direct Dial: [phone]  Website: [email or web address]

## 2024-03-08 ENCOUNTER — Other Ambulatory Visit: Payer: Self-pay

## 2024-03-08 NOTE — Patient Instructions (Addendum)
 Visit Information  Thank you for taking time to visit with me today. Please don't hesitate to contact me if I can be of assistance to you before our next scheduled telephone appointment.  Our next appointment is by telephone on 3/19 at 12N  Assessment:   Patient/ Caregiver  voices no new complaints or concerns  and has not developed/ reported any new medical issues / Dx or acute changes. - since last follow-up call for most recent   Hospital stay     2/29-2/24  / 2025  No reported or reviewed Hospital Readmissions / No Urgent Care Visits / Transfers for Acute Change in condition .  MD/Specialist or Consultant visits reported since last follow-up call, Triage calls with PCP x 2 for oral thrush and medication clarifications ( Nystatin and Metoprolol)  Seen at [facility] for GU follow-up Medication changes new referral  [place] [facility] and testing results reviewed  She had a prior Amb Referral for pharmacy contact was made and call scheduled 4/4. She is aware if what to expect for call and wants to consider changing pharmacies as well Patient was Alert & O x 4 ,responsive, able to voice needs, cooperative with visit   There were no noted or reported cognitive/ mental status changes during this assessment General status reviewed  patient presents as expected for age and current situation with  finding as noted in previous sections of assessment  Patient denies changes in ADL function or IADLs  at this time.   Patient mood, expressions, emotional tone, insight, judgement  and thought content as expected with no notable or reported change No reports of new onset or increased / uncontrolled pain, has chronic pain managed with medicinal and non-medicinal interventions.  No changes in skin, no rashes, wounds or open areas - however she did develop Oral thrush possibly r/t Prednisone use She stated this was improving with Nystatin oral suspension  Diet reviewed Patient is following prescribed diet  education provided as needed to promote  healthy diet . She was seen at [facility]  clinic and has a Referral to [facility] for financial and food insecurity resources. Therapy Services NA    Medication reconciliation/ review completed based on most recent medication list in EHR; and in review of recent Provider follow-up appointments- confirmed patient obtained / is taking all newly prescribed medications as instructed and is aware of any changes to previous medications regimen including dosage adjustments.  Reports  new changes in medication regimen  during this review period.  Changes noted- Stop taking Trazodone 50 mg PRN for sleep. Do not refill. - Stop taking Restoril 15 mg PRN. Do not refill. - Start taking Seroquel 25 mg at night for 1 week, then increase to 50 mg nightly for 1 week, then increase to 75 mg nightly for sleep. Can take 1-3 pills at night for sleep. - Continue taking Fioricet as needed for headaches. Take 1 tablet at headache onset, can repeat after 4 hours. No more than 2 pills in 24 hours. Do not take more than 2-3 times a week MAXIMUM.  nystatin (MYCOSTATIN) 100000 UNIT/ML suspension 60 mL 1 03/06/2024 --    Sig - Route: Take 5 mLs (500,000 Units total) by mouth 4 (four) times daily. - Oral       Following is a copy of your care plan:   Goals Addressed             This Visit's Progress    TOC Care Plan  Current Barriers:  Medication management multiple changes  Provider appointments PCP Pulmonology Cardiology Transportation Uses Benefits, [facility] and Family Friends  Chronic Disease Management support and education needs related to DMII and Pulmonary Disease   RNCM Clinical Goal(s):  Patient will work with the Care Management team over the next 30 days to address Transition of Care Barriers: Medication Management Support at home Provider appointments take all medications exactly as prescribed and will call provider for medication related  questions as evidenced by no missed medication doses attend all scheduled medical appointments: as evidenced by no missed follow-up visits  through collaboration with RN Care manager, provider, and care team.   Interventions: Evaluation of current treatment plan related to  self management and patient's adherence to plan as established by provider  Transitions of Care:  Goal on track:  Yes. Doctor Visits  - discussed the importance of doctor visits [facility] RN/OT/PT  Post discharge activity limitations prescribed by provider reviewed Reviewed Signs and symptoms of infection  Asthma: (Status:Goal on track:  Yes.) Short Term Goal Provided instruction about proper use of medications used for management of Asthma including inhalers Provided education about and advised patient to utilize infection prevention strategies to reduce risk of respiratory infection Discussed the importance of adequate rest and management of fatigue with Asthma Screening for signs and symptoms of depression related to chronic disease state  Assessed social determinant of health barriers   Patient Goals/Self-Care Activities: Participate in Transition of Care Program/Attend TOC scheduled calls Take all medications as prescribed Attend all scheduled provider appointments Call pharmacy for medication refills 3-7 days in advance of running out of medications Perform all self care activities independently  Perform IADL's (shopping, preparing meals, housekeeping, managing finances) independently Call provider office for new concerns or questions   Follow Up Plan:  Telephone follow up appointment with care management team member scheduled for:  03/15/24 @ 12:00pm The patient has been provided with contact information for the care management team and has been advised to call with any health related questions or concerns.          Medication review  Reviewed current home medications -- provided education as  needed. Patient is aware of potential side effects and was encouraged to notify PCP for any adverse side effects or unwanted symptoms not relieved with interventions  Patient will call 911 for Medical Emergencies or Life -Threatening Symptoms.  Reviewed goals for care Patient/ Caregiver verbalizes understanding of instructions with the plan of care . The  Patient / Caregiver was encouraged to make informed decisions about care, actively participate in managing health conditions, and implement lifestyle changes as needed to promote independence and self-management of healthcare. SDOH screenings have been completed and addressed if indicted.  There are no reported barriers to care.    Follow-up Plan Continue Oral Nystatin as directed  [facility] and obtain a copy of current medications in  prep for med reconciliation ( labels have not reflected current dose and  correct directives)  Begin Seroquel for insomnia Follow-up with [facility] [facility] Amb Referral Pharmacy call 4/4   [facility] Case Management Nurse will provide follow-up and on-going assessment ,evaluation and education of disease processes, recommended interventions for both chronic and acute medical conditions ,  along with ongoing review of symptoms ,medication reviews / reconciliation during each weekly call . Any updates , inconsistencies, discrepancies or acute care concerns will be addressed and routed to the correct Practitioner if indicated  The patient has been provided with contact information for the care management team and has been advised to call with any health-related questions or concerns. Follow up call  with Care Team  as scheduled,or sooner should any new problems arise.  [facility]  Please call the care guide team at [phone]  if you need to cancel or reschedule your appointment . For scheduled calls -Three attempts will be made to reach you -if the scheduled call is  missed or  we are unable to reach the you after 3 attempts no additional outreach attempts will be made and the TOC follow-up will be closed .   If you need to speak to a Nurse you may  call me directly at the number below or if I am unavailable,and  your need is urgent  please call the main [facility] number at [phone] and ask to speak with one of the [facility] ( Transition of Care )  Nurses  .  Patient was encouraged to Contact PCP with any changes in baseline or  medication regimen,  changes in health status  /  well-being, safety concerns, including falls any questions or concerns regarding ongoing medical care, any difficulty obtaining or picking up prescriptions, any changes or worsening in condition- including  symptoms not relieved  with interventions                                                                            Additionally, If you experience worsening of your symptoms, develop shortness of breath, If you are experiencing a medical emergency,  develop suicidal or homicidal thoughts you must seek medical attention immediately by calling 911 or report to your local emergency department or urgent care.   If you have a non-emergency medical problem during routine business hours, please contact your provider's office and ask to speak with a nurse.       Please take the time to read instructions/literature along with the possible adverse reactions/side effects for all the Medicines that have been prescribed to you. Only take newly prescribed  Medications after you have completely understood and accept all the possible adverse reactions/side effects.   Do not take more than prescribed Medications for  Pain, Sleep and Anxiety. Do not drive when taking Pain medications or sleep aid/ insomnia  medications It is not advisable to combine anxiety, sleep and pain medications without talking with your primary care practitioner    If you are experiencing a Mental Health or Behavioral Health Crisis or  need someone to talk to Please call the Suicide and Crisis Lifeline: 988 You may also call the [place] National Suicide Prevention Lifeline: 312 337 6209 or TTY: 574-850-2306 TTY 365-178-4151) to talk to a trained counselor.  You may call the Behavioral Health Crisis Line at (380)869-2792, at any time, 24 hours a day, 7 days a week- however If you are in danger or need immediate medical attention, call 911.   If you would like help to quit smoking, call 1-800-QUIT-NOW ( (272) 038-3600) OR Espaol: 1-855-Djelo-Ya (3-235-573-2202) o para ms informacin haga clic aqu or Text READY to 542-706 to register via text.   Susa Loffler , BSN, RN [place]   [facility]-Population Health RN Care  Radiation protection practitioner [phone]  Website: [email or web address]

## 2024-03-08 NOTE — Patient Outreach (Signed)
 Care Management  Transitions of Care Program Transitions of Care Post-discharge week 3   03/08/2024 Name: Brandy Johnston MRN: [id number] DOB: May 28, 1961  Subjective: Brandy Johnston is a 63 y.o. year old female who is a primary care patient of Alba Cory, MD. The Care Management team Engaged with patient Engaged with patient by telephone to assess and address transitions of care needs.   Consent to Services:  Patient was given information about care management services, agreed to services, and gave verbal consent to participate.   Assessment:   Patient/ Caregiver  voices no new complaints or concerns  and has not developed/ reported any new medical issues / Dx or acute changes. - since last follow-up call for most recent   Hospital stay     2/29-2/24  / 2025  No reported or reviewed Hospital Readmissions / No Urgent Care Visits / Transfers for Acute Change in condition .  MD/Specialist or Consultant visits reported since last follow-up call, Triage calls with PCP x 2 for oral thrush and medication clarifications ( Nystatin and Metoprolol)  Seen at [facility] clinic for GU follow-up Medication changes new referral  [facility] and testing results reviewed  She had a prior Amb Referral for pharmacy contact was made and call scheduled 4/4. She is aware if what to expect for call and wants to consider changing pharmacies as well Patient was Alert & O x 4 ,responsive, able to voice needs, cooperative with visit   There were no noted or reported cognitive/ mental status changes during this assessment General status reviewed  patient presents as expected for age and current situation with  finding as noted in previous sections of assessment  Patient denies changes in ADL function or IADLs  at this time.   Patient mood, expressions, emotional tone, insight, judgement  and thought content as expected with no notable or reported change No reports of new onset or increased / uncontrolled pain, has  chronic pain managed with medicinal and non-medicinal interventions.  No changes in skin, no rashes, wounds or open areas - however she did develop Oral thrush possibly r/t Prednisone use She stated this was improving with Nystatin oral suspension  Diet reviewed Patient is following prescribed diet education provided as needed to promote  healthy diet . She was seen at [facility]  cl[facility] and has a Referral to [facility] for financial and food insecurity resources. Therapy Services NA    Medication reconciliation/ review completed based on most recent medication list in EHR; and in review of recent Provider follow-up appointments- confirmed patient obtained / is taking all newly prescribed medications as instructed and is aware of any changes to previous medications regimen including dosage adjustments.  Reports  new changes in medication regimen  during this review period.  Changes noted- Stop taking Trazodone 50 mg PRN for sleep. Do not refill. - Stop taking Restoril 15 mg PRN. Do not refill. - Start taking Seroquel 25 mg at night for 1 week, then increase to 50 mg nightly for 1 week, then increase to 75 mg nightly for sleep. Can take 1-3 pills at night for sleep. - Continue taking Fioricet as needed for headaches. Take 1 tablet at headache onset, can repeat after 4 hours. No more than 2 pills in 24 hours. Do not take more than 2-3 times a week MAXIMUM.  nystatin (MYCOSTATIN) 100000 UNIT/ML suspension 60 mL 1 03/06/2024 --    Sig - Route: Take 5 mLs (500,000 Units total) by  mouth 4 (four) times daily. - Oral    Medications and current treatments are effective for use intended. No adverse Drug reactions  reported No changes or modifications indicated at this time.   Patient self-manages  medications Patient is able to take medications without difficulty;  denies questions/ concerns and reports no barriers to medication adherence at this time   Patient educated on red flag s/s to watch  for based on current discharge diagnosis and was encouraged to report, any changes in baseline or  medication regimen,   or any new unmanaged side effects or symptoms not relieved with interventions  to PCP and / or the  [facility] Case Management team .          SDOH Interventions    Flowsheet Row Telephone from 02/22/2024 in [place] [facility] DEPARTMENT Office Visit from 12/09/2022 in [facility] Office Visit from 01/15/2021 in [facility]  SDOH Interventions     Food Insecurity Interventions Intervention Not Indicated Intervention Not Indicated --  Housing Interventions Intervention Not Indicated Intervention Not Indicated Intervention Not Indicated  Transportation Interventions Intervention Not Indicated, Patient Resources (Friends/Family), Payor Benefit Intervention Not Indicated Intervention Not Indicated  Utilities Interventions Intervention Not Indicated Intervention Not Indicated --  Alcohol Usage Interventions -- Intervention Not Indicated (Score <7) --  Financial Strain Interventions -- Intervention Not Indicated Other (Comment)   [doing better now that has disability]  Stress Interventions -- Intervention Not Indicated Intervention Not Indicated  Social Connections Interventions -- Intervention Not Indicated --        Goals Addressed             This Visit's Progress    TOC Care Plan       Current Barriers:  Medication management multiple changes  Provider appointments PCP Pulmonology Cardiology Transportation Uses Benefits, [facility] Public and Family Friends  Chronic Disease Management support and education needs related to DMII and Pulmonary Disease   RNCM Clinical Goal(s):  Patient will work with the Care Management team over the next 30 days to address Transition of Care Barriers: Medication Management Support at home Provider appointments take all medications exactly as prescribed and will call provider for  medication related questions as evidenced by no missed medication doses attend all scheduled medical appointments: as evidenced by no missed follow-up visits  through collaboration with RN Care manager, provider, and care team.   Interventions: Evaluation of current treatment plan related to  self management and patient's adherence to plan as established by provider  Transitions of Care:  Goal on track:  Yes. Doctor Visits  - discussed the importance of doctor visits [facility] RN/OT/PT  Post discharge activity limitations prescribed by provider reviewed Reviewed Signs and symptoms of infection  Asthma: (Status:Goal on track:  Yes.) Short Term Goal Provided instruction about proper use of medications used for management of Asthma including inhalers Provided education about and advised patient to utilize infection prevention strategies to reduce risk of respiratory infection Discussed the importance of adequate rest and management of fatigue with Asthma Screening for signs and symptoms of depression related to chronic disease state  Assessed social determinant of health barriers   Patient Goals/Self-Care Activities: Participate in Transition of Care Program/Attend TOC scheduled calls Take all medications as prescribed Attend all scheduled provider appointments Call pharmacy for medication refills 3-7 days in advance of running out of medications Perform all self care activities independently  Perform IADL's (shopping, preparing meals, housekeeping, managing finances)  independently Call provider office for new concerns or questions   Follow Up Plan:  Telephone follow up appointment with care management team member scheduled for:  03/15/24 @ 12:00pm The patient has been provided with contact information for the care management team and has been advised to call with any health related questions or concerns.          Plan:  Continue Oral Nystatin as directed  Follow-up with  [facility] for new prescriptions and obtain a copy of current medications in  prep for med reconciliation ( labels have not reflected current dose and  correct directives)  Begin Seroquel for insomnia Follow-up with [place] [facility] Amb Referral Pharmacy call 4/4  The patient has been provided with contact information for the care management team and has been advised to call with any health related questions or concerns.   Routine follow-up and on-going assessment evaluation and education of disease processes, recommended interventions for both chronic and acute medical conditions , will occur during each weekly visit along with ongoing review of symptoms ,medication reviews and reconciliation. Any updates , inconsistencies, discrepancies or acute care concerns will be addressed and routed to the correct Practitioner if indicated   Based on current information and Insurance plan -Reviewed benefits available to patient, including details about eligibility options for care if any area of needs were identified.  Reviewed patients ability to access and / or navigating the benefits system..Amb Referral made if indicted , refer to orders section of note for details   Please refer to Care Plan for goals and interventions -Effectiveness of interventions, symptom management and outcomes will be evaluated  weekly during [facility] 30-day Program Outreach calls  . Any necessary  changes and updates to Care Plan will be completed episodically    Reviewed goals for care Patient verbalizes understanding of instructions and care plan provided. Patient was encouraged to make informed decisions about their care, actively participate in managing their health condition, and implement lifestyle changes as needed to promote independence and self-management of health care   Patient was encouraged to Contact PCP with any changes in baseline or  medication regimen,  changes in health status  /  well-being, safety concerns, including  falls any questions or concerns regarding ongoing medical care, any difficulty obtaining or picking up prescriptions, any changes or worsening in condition- including  symptoms not relieved  with interventions    The patient has been provided with contact information for the care management team and has been advised to call with any health-related questions or concerns. Follow up call  with Care Team  as scheduled,or sooner should any new problems arise.   Susa Loffler , BSN, RN [facility]   VBCI-Population Health RN Care Manager Direct Dial [phone]  Fax: [phone] Website: [email or web address]

## 2024-03-10 ENCOUNTER — Ambulatory Visit: Payer: Self-pay | Admitting: Family Medicine

## 2024-03-10 DIAGNOSIS — R1310 Dysphagia, unspecified: Secondary | ICD-10-CM | POA: Diagnosis not present

## 2024-03-10 DIAGNOSIS — I129 Hypertensive chronic kidney disease with stage 1 through stage 4 chronic kidney disease, or unspecified chronic kidney disease: Secondary | ICD-10-CM | POA: Diagnosis not present

## 2024-03-10 DIAGNOSIS — J8283 Eosinophilic asthma: Secondary | ICD-10-CM | POA: Diagnosis not present

## 2024-03-10 DIAGNOSIS — E1122 Type 2 diabetes mellitus with diabetic chronic kidney disease: Secondary | ICD-10-CM | POA: Diagnosis not present

## 2024-03-10 DIAGNOSIS — N183 Chronic kidney disease, stage 3 unspecified: Secondary | ICD-10-CM | POA: Diagnosis not present

## 2024-03-10 DIAGNOSIS — E1142 Type 2 diabetes mellitus with diabetic polyneuropathy: Secondary | ICD-10-CM | POA: Diagnosis not present

## 2024-03-10 DIAGNOSIS — J45901 Unspecified asthma with (acute) exacerbation: Secondary | ICD-10-CM | POA: Diagnosis not present

## 2024-03-10 DIAGNOSIS — J189 Pneumonia, unspecified organism: Secondary | ICD-10-CM | POA: Diagnosis not present

## 2024-03-10 DIAGNOSIS — I421 Obstructive hypertrophic cardiomyopathy: Secondary | ICD-10-CM | POA: Diagnosis not present

## 2024-03-10 NOTE — Telephone Encounter (Signed)
 Copied from CRM [id number]. Topic: Clinical - Red Word Triage >> Mar 10, 2024  4:55 PM DeAngela L wrote: Red Word that prompted transfer to Nurse Triage: Patient sugar is 334 and was told by the dr to contact the office if it's over 3 at any time, also medication prescribed Seroquel 25mg  for her headaches patient states making her crazy and do unusual things.   Chief Complaint: Hyperglycemia  Symptoms: Vomiting, nausea, dizziness, drowsiness  Frequency: Constant  Disposition: [x] ED /[] Urgent Care (no appt availability in office) / [] Appointment(In office/virtual)/ []  [place] [facility]/ [] Home Care/ [] Refused Recommended Disposition /[] [place] [facility]/ []  Follow-up with PCP Additional Notes: Patient reports that her blood sugar is 334. She states that she is experiencing nausea, vomiting, dizziness, and drowsiness. She states that some of her symptoms may be due to her recently starting Seroquel. Patient states that she does not have any insulin and takes only Ozempic for her diabetes. Patient advised that with her symptoms she should go to the ED for evaluation and treatment. Patient verbalized understanding and agreement with this plan.    Reason for Disposition  [1] Blood glucose > 240 mg/dL (95.2 mmol/L) AND [8] vomiting AND [3] unable to check for ketones (in blood or urine)  Answer Assessment - Initial Assessment Questions 1. BLOOD GLUCOSE: "What is your blood glucose level?"      344 2. ONSET: "When did you check the blood glucose?"     Today  3. USUAL RANGE: "What is your glucose level usually?" (e.g., usual fasting morning value, usual evening value)     90-110 4. KETONES: "Do you check for ketones (urine or blood test strips)?" If Yes, ask: "What does the test show now?"      No 5. TYPE 1 or 2:  "Do you know what type of diabetes you have?"  (e.g., Type 1, Type 2, Gestational; doesn't know)      Type 2 6. INSULIN: "Do you take insulin?" "What type of insulin(s) do you  use? What is the mode of delivery? (syringe, pen; injection or pump)?"      No 7. DIABETES PILLS: "Do you take any pills for your diabetes?" If Yes, ask: "Have you missed taking any pills recently?"     No, takes Ozempic 8. OTHER SYMPTOMS: "Do you have any symptoms?" (e.g., fever, frequent urination, difficulty breathing, dizziness, weakness, vomiting)     Nausea, vomiting  Protocols used: Diabetes - High Blood Sugar-A-AH

## 2024-03-11 DIAGNOSIS — R739 Hyperglycemia, unspecified: Secondary | ICD-10-CM | POA: Insufficient documentation

## 2024-03-15 ENCOUNTER — Other Ambulatory Visit: Payer: Self-pay

## 2024-03-15 NOTE — Patient Outreach (Signed)
 Care Management  Transitions of Care Program Transitions of Care Post-discharge week 4  03/15/2024 Name: [name] MRN: [id number] DOB: [birth year]  Subjective: Brandy Johnston is a 63 y.o. year old female who is a primary care patient of Alba Cory, MD. The Care Management team was unable to reach the patient by phone to assess and address transitions of care needs.  Patient  Outreach attempt is in the course of  [facility]  30-day TOC program. Pt previously agreed and is enrolled in the  program due to potential risk for readmission and/or high utilization. Unfortunately, I was not able to speak with the patient in regards to recent hospital discharge  Per chart review the patient has readmitted to the hospital   Current Admission  Hospital : [facility] Healthcare Admission Date 3/15-25  -  03/13/24 Reason for visit / New / Discharge Diagnoses Hyperglycemia     Patient's voicemail has  a generic greeting. To maintain HIPAA compliance, left message including only [facility] CM contact information and a request for a call back .  Plan: Additional outreach attempts will be made to reach the patient enrolled in the [facility] Program (Post Inpatient/ED Visit).   Susa Loffler , BSN, RN [facility]   VBCI-Population Health RN Care Manager Direct Dial [phone]  Fax: [phone] Website: [email or web address]

## 2024-03-16 ENCOUNTER — Telehealth: Payer: Self-pay

## 2024-03-16 DIAGNOSIS — E1142 Type 2 diabetes mellitus with diabetic polyneuropathy: Secondary | ICD-10-CM

## 2024-03-16 DIAGNOSIS — I10 Essential (primary) hypertension: Secondary | ICD-10-CM

## 2024-03-16 DIAGNOSIS — J189 Pneumonia, unspecified organism: Secondary | ICD-10-CM | POA: Diagnosis not present

## 2024-03-16 DIAGNOSIS — I129 Hypertensive chronic kidney disease with stage 1 through stage 4 chronic kidney disease, or unspecified chronic kidney disease: Secondary | ICD-10-CM | POA: Diagnosis not present

## 2024-03-16 DIAGNOSIS — R1310 Dysphagia, unspecified: Secondary | ICD-10-CM | POA: Diagnosis not present

## 2024-03-16 DIAGNOSIS — N183 Chronic kidney disease, stage 3 unspecified: Secondary | ICD-10-CM

## 2024-03-16 DIAGNOSIS — J45901 Unspecified asthma with (acute) exacerbation: Secondary | ICD-10-CM | POA: Diagnosis not present

## 2024-03-16 DIAGNOSIS — R002 Palpitations: Secondary | ICD-10-CM

## 2024-03-16 DIAGNOSIS — E1122 Type 2 diabetes mellitus with diabetic chronic kidney disease: Secondary | ICD-10-CM | POA: Diagnosis not present

## 2024-03-16 DIAGNOSIS — I421 Obstructive hypertrophic cardiomyopathy: Secondary | ICD-10-CM | POA: Diagnosis not present

## 2024-03-16 DIAGNOSIS — J8283 Eosinophilic asthma: Secondary | ICD-10-CM | POA: Diagnosis not present

## 2024-03-16 DIAGNOSIS — I739 Peripheral vascular disease, unspecified: Secondary | ICD-10-CM

## 2024-03-16 DIAGNOSIS — D573 Sickle-cell trait: Secondary | ICD-10-CM

## 2024-03-16 DIAGNOSIS — N1831 Chronic kidney disease, stage 3a: Secondary | ICD-10-CM

## 2024-03-16 NOTE — Transitions of Care (Post Inpatient/ED Visit) (Signed)
 03/16/2024  Name: Brandy Johnston MRN: [id number] DOB: 12/18/1961  Today's TOC FU Call Status: Today's TOC FU Call Status:: Successful TOC FU Call Completed Unsuccessful Call (1st Attempt) Date: 03/15/24 [facility] FU Call Complete Date: 03/16/24 (enrolled in [facility] with planned call 3/19 -readmitted) Patient's Name and Date of Birth confirmed.  Transition Care Management Follow-up Telephone Call Date of Discharge: 03/13/24 Discharge Facility: Other ([facility]) Name of Other ([facility]) Discharge Facility: [facility] Type of Discharge: Inpatient Admission Primary Inpatient Discharge Diagnosis:: Hyperglycemia (Primary Dx); Long term systemic steroid user; Troponin level elevated How have you been since you were released from the hospital?: Better Any questions or concerns?: No  Items Reviewed: Did you receive and understand the discharge instructions provided?: Yes Medications obtained,verified, and reconciled?: Yes (Medications Reviewed) (Medication reconciliation completed based on recent discharge summary Patient taking medications as instructed and is aware of any changes or dosage adjustments medication regimen. Patient denies questions and reports no barriers to medication adherence) Any new allergies since your discharge?: No Dietary orders reviewed?: Yes Type of Diet Ordered:: Reg Heart Healthy Carb modified Do you have support at home?: Yes People in Home: child(ren), adult, alone Name of Support/Comfort Primary Source: Son [name] , sister [name]  Medications Reviewed Today: Medications Reviewed Today     Reviewed by Johnnette Barrios, RN (Registered Nurse) on 03/16/24 at 1158  Med List Status: <None>   Medication Order Taking? Sig Documenting Provider Last Dose Status Informant  albuterol (VENTOLIN HFA) 108 (90 Base) MCG/ACT inhaler [id number] Yes SMARTSIG:2 Puff(s) By Mouth Every 4-6 Hours PRN [provider] Taking Active   amLODipine-valsartan (EXFORGE) 5-160 MG  tablet [id number] Yes daily. [provider] Taking Active   aspirin (ASPIRIN 81) 81 MG chewable tablet [id number] Yes Chew 1 tablet (81 mg total) by mouth daily. Alba Cory, MD Taking Active Self  Atogepant (QULIPTA) 30 MG TABS [id number] Yes Take 1 tablet (30 mg total) by mouth daily at 12 noon. Alba Cory, MD Taking Active   benralizumab [facility] PEN) 30 MG/ML prefilled autoinjector [id number] No Inject into the skin.  Patient not taking: Reported on 03/16/2024   [provider] Not Taking Active   Blood Glucose Monitoring Suppl (CONTOUR NEXT ONE) KIT [id number] Yes USE TO TEST BLOOD SUGAR ONCE D [provider] Taking Active Self  Budeson-Glycopyrrol-Formoterol (BREZTRI AEROSPHERE) 160-9-4.8 MCG/ACT AERO [id number] Yes Inhale 2 puffs into the lungs in the morning and at bedtime. [provider] Taking Active   butalbital-acetaminophen-caffeine (FIORICET) 50-325-40 MG tablet [id number] Yes Take 1 tablet by mouth every 6 (six) hours as needed for headache. Alba Cory, MD Taking Active   Cholecalciferol (VITAMIN D) 2000 units CAPS [id number] Yes Take 1 capsule (2,000 Units total) by mouth daily. Alba Cory, MD Taking Active Self  Continuous Glucose Sensor (FREESTYLE LIBRE 3 SENSOR) [place] [id number] Yes 1 Device by Does not apply route every 14 (fourteen) days. Alba Cory, MD Taking Active            Med Note Sharon Seller, Jadamarie Butson L   Tue Feb 22, 2024  1:10 PM) Continue sliding scale insulin given history of steroid-induced hyperglycemia; has had minimal sliding scale requirements   cyclobenzaprine (FLEXERIL) 5 MG tablet [id number] Yes Take 1 tablet (5 mg total) by mouth at bedtime. Alba Cory, MD Taking Active   diclofenac Sodium (VOLTAREN) 1 % GEL [id number] Yes Apply 2 g topically 4 (four) times daily. Alba Cory, MD Taking Active   diltiazem (CARDIZEM CD) 180 MG 24 hr capsule  [id number] Yes Take 1 capsule (180 mg total) by mouth daily. Alba Cory, MD Taking Active   empagliflozin (JARDIANCE) 25 MG TABS tablet [id number] Yes Take 1 tablet (25 mg total) by mouth daily before breakfast. Alba Cory, MD Taking Active   ezetimibe (ZETIA) 10 MG tablet [id number] Yes Take 1 tablet (10 mg total) by mouth daily. Alba Cory, MD Taking Active   famotidine (PEPCID) 20 MG tablet [id number] Yes Take 1 tablet (20 mg total) by mouth 2 (two) times daily. Alba Cory, MD Taking Active   fluticasone [facility]) 50 MCG/ACT nasal spray [id number] Yes 2 sprays each nostril Daily. DISP#  1 bottles = 1 month supply. [provider] Taking Active   gabapentin (NEURONTIN) 300 MG capsule [id number] Yes TAKE 1 CAPSULE(300 MG) BY MOUTH THREE TIMES DAILY Carlynn Purl, Danna Hefty, MD Taking Active   glucose blood test strip [id number] Yes Use as instructed Alba Cory, MD Taking Active Self  ipratropium-albuterol (DUONEB) 0.5-2.5 (3) MG/3ML SOLN [id number] Yes Take 3 mLs by nebulization every 6 (six) hours as needed. Berniece Salines, FNP Taking Active     Discontinued 01/29/20 1715   loratadine (CLARITIN) 10 MG tablet [id number] Yes Take 1 tablet (10 mg total) by mouth daily. Alba Cory, MD Taking Active   magnesium oxide (MAG-OX) 400 MG tablet [id number] Yes Take 1 tablet (400 mg total) by mouth 2 (two) times daily. Alba Cory, MD Taking Active   meclizine (ANTIVERT) 25 MG tablet [id number] Yes Take 1 tablet (25 mg total) by mouth 3 (three) times daily as needed for dizziness. Corena Herter, MD Taking Active   metoprolol succinate (TOPROL-XL) 25 MG 24 hr tablet [id number] Yes Take 1 tablet (25 mg total) by mouth daily. Alba Cory, MD Taking Active            Med Note (Detravion Tester L   Wed Mar 08, 2024 10:31 AM) metoPROLOL succinate 25 MG 24 hr tablet Commonly known as: Toprol-XL Take 1/2 tablet (12.5 mg total) by mouth daily    Microlet Lancets MISC [id number] Yes USE TO CHECK BLOOD SUGAR ONCE D [provider] Taking Active Self   montelukast (SINGULAIR) 10 MG tablet [id number] Yes Take by mouth. [provider] Taking Active   nystatin (MYCOSTATIN) 100000 UNIT/ML suspension [id number] Yes Take 5 mLs (500,000 Units total) by mouth 4 (four) times daily. Alba Cory, MD Taking Active   ondansetron (ZOFRAN-ODT) 4 MG disintegrating tablet [id number] Yes Take 1 tablet (4 mg total) by mouth every 8 (eight) hours as needed for nausea. Berniece Salines, FNP Taking Active   pantoprazole (PROTONIX) 40 MG tablet [id number] Yes Take 40 mg by mouth 2 (two) times daily. [provider] Taking Active   predniSONE (DELTASONE) 10 MG tablet [id number] Yes Take 10 mg by mouth daily with breakfast. 60 mg daily, plan for 7-days then taper by 10mg  every 7 days. [provider] Taking Active   promethazine (PHENERGAN) 25 MG tablet [id number] Yes Take 1 tablet (25 mg total) by mouth every 8 (eight) hours as needed for nausea or vomiting. Berniece Salines, FNP Taking Active   rosuvastatin (CRESTOR) 40 MG tablet [id number] Yes TAKE 1 TABLET(40 MG) BY MOUTH DAILY Carlynn Purl, Danna Hefty, MD Taking Active   Semaglutide,0.25 or 0.5MG /DOS, (OZEMPIC, 0.25 OR 0.5 MG/DOSE,) 2 MG/3ML SOPN [id number] Yes Inject 0.5 mg into the skin once a week. Alba Cory, MD Taking Active   sodium chloride 0.9 % nebulizer solution [id number] Yes Inhale into the lungs. [provider] Taking Active  Spacer/Aero-Holding [facility] DIAMOND) MISC [id number] Yes  [provider] Taking Active   sulfamethoxazole-trimethoprim (BACTRIM DS) 800-160 MG tablet [id number] Yes Take 1 tablet by mouth daily. MWF bactrim PJP prophylaxis [provider] Taking Active   traZODone (DESYREL) 50 MG tablet [id number] No TAKE 1 TABLET(50 MG) BY MOUTH AT BEDTIME  Patient not taking: Reported on 03/16/2024   Alba Cory, MD Not Taking Active   triamcinolone cream (KENALOG) 0.1 % [id number] Yes Apply 1 Application topically 2 (two) times daily.  Alba Cory, MD Taking Active   Ubrogepant (UBRELVY) 100 MG TABS [id number] Yes Take 1 tablet (100 mg total) by mouth daily as needed. Alba Cory, MD Taking Active           Medication reconciliation / review completed based on most recent discharge summary and EHR medication list. Confirmed patient is taking all newly prescribed medications as instructed (any discrepancies are noted in review section)   Patient / Caregiver is aware of any changes to and / or  any dosage adjustments to medication regimen. Patient/ Caregiver denies questions at this time and reports no barriers to medication adherence.   Updates noted : START taking these medications  glucose blood test strip Generic drug: blood sugar diagnostic Use to check blood sugar as directed with insulin 3 times a day & for symptoms of high or low blood sugar.  insulin NPH 100 unit/mL injection Commonly known as: HumuLIN,NovoLIN Inject 0.05 mL (5 Units total) under the skin daily.  insulin syringe-needle U-100 1/2 mL 31 gauge x 1/4" (6 mm) Syrg Use with insulin up to 4 times/day as needed.  lancets Misc Use to check blood sugar as directed with insulin 3 times a day & for symptoms of high or low blood sugar.  CHANGE how you take these medications   CONTOUR METER kit Generic drug: blood-glucose meter CONTOUR BLOOD GLUCOSE SYSTEM W/DEVICE KIT What changed: Another medication with the same name was added. Make sure you understand how and when to take each.    Home Care and Equipment/Supplies: Were Home Health Services Ordered?: Yes Name of Home Health Agency:: [facility] [phone] Has Agency set up a time to come to your home?: Yes First Home Health Visit Date: 03/16/24 Any new equipment or medical supplies ordered?: Yes Name of Medical supply agency?: Hospital supplied walker Were you able to get the equipment/medical supplies?: Yes Do you have any questions related to the use of the equipment/supplies?:  No  Functional Questionnaire: Do you need assistance with bathing/showering or dressing?: Yes Do you need assistance with meal preparation?: No Do you need assistance with eating?: No Do you have difficulty maintaining continence: No Do you need assistance with getting out of bed/getting out of a chair/moving?: Yes (using a walker and furniture  for support) Do you have difficulty managing or taking your medications?: No  Follow up appointments reviewed: PCP Follow-up appointment confirmed?: Yes Date of PCP follow-up appointment?: 03/17/24 Follow-up Provider: Alba Cory, MD Specialist Hospital Follow-up appointment confirmed?: Yes Date of Specialist follow-up appointment?: 04/07/24 Follow-Up Specialty Provider:: [facility] CARDIOLOGY [place] Apr 07, 2024 4:20 PM-  [facility] GI MEDICINE [place] (Apr 10, 2024 12:30 PM  [facility] [place] Apr 13, 2024 3:30 PM MRI Cardiac Morphology With and Without Contrast with [facility] Apr 25, 2024 9:30 AM Do you need transportation to your follow-up appointment?: No Do you understand care options if your condition(s) worsen?: Yes-patient verbalized understanding  Goals Addressed  This Visit's Progress    TOC Care Plan       Current Barriers:  Medication management multiple changes  Provider appointments PCP Pulmonology Cardiology-  Date of PCP follow-up appointment?: 03/17/24 Follow-up Provider: Alba Cory, MD Specialist Hospital Follow-up appointment confirmed?: Yes Date of Specialist follow-up appointment?: 04/07/24 Follow-Up Specialty Provider:: [facility] CARDIOLOGY [place] Apr 07, 2024 4:20 PM-   [facility] GI MEDICINE [place] (Apr 10, 2024 12:30 PM   [facility] [place] Apr 13, 2024 3:30 PM  MRI Cardiac Morphology With and Without Contrast with [facility] Apr 25, 2024 9:30 AM Transportation Uses Benefits, [facility] Public and Family Friends  Chronic Disease  Management support and education needs related to DMII and Pulmonary Disease   RNCM Clinical Goal(s):  Patient will work with the Care Management team over the next 30 days to address Transition of Care Barriers: Medication Management Support at home Provider appointments take all medications exactly as prescribed and will call provider for medication related questions as evidenced by no missed medication doses attend all scheduled medical appointments: as evidenced by no missed follow-up visits  through collaboration with RN Care manager, provider, and care team.   Interventions: Evaluation of current treatment plan related to  self management and patient's adherence to plan as established by provider  Transitions of Care:  Goal on track:  Yes. Doctor Visits  - discussed the importance of doctor visits [facility] RN/OT/PT  Post discharge activity limitations prescribed by provider reviewed Reviewed Signs and symptoms of infection  Asthma: (Status:Goal on track:  Yes.) Short Term Goal Provided instruction about proper use of medications used for management of Asthma including inhalers Provided education about and advised patient to utilize infection prevention strategies to reduce risk of respiratory infection Discussed the importance of adequate rest and management of fatigue with Asthma Screening for signs and symptoms of depression related to chronic disease state  Assessed social determinant of health barriers   Patient Goals/Self-Care Activities: Participate in Transition of Care Program/Attend TOC scheduled calls Take all medications as prescribed Attend all scheduled provider appointments Call pharmacy for medication refills 3-7 days in advance of running out of medications Perform all self care activities independently  Perform IADL's (shopping, preparing meals, housekeeping, managing finances) independently Call provider office for new concerns or questions   Follow Up Plan:   Telephone follow up appointment with care management team member scheduled for:  03/21/24 @ 2:30pm for 1 additional call  The patient has been provided with contact information for the care management team and has been advised to call with any health related questions or concerns.          . Benefits reviewed  Based on current information and Insurance plan -Reviewed benefits accessible to patient, including details about eligibility options for care and  available value based care options  if any areas of needs were identified.  Reviewed patient/  caregiver's ability to access and / or  ability with navigating the benefits system..Amb Referral made if indicted , refer to orders section of note for details   Reviewed goals for care  Ambulatory Referral  for identified needs. Please see additional details in the order section of this note  Patient  was made aware   The Complex Care Management services include support from the care team which includes  a  Pharmacist. To assist wit medication needs and concerns ( Polypharmacy)  The Complex Care Management team's purpose is to assist in removing  barriers to your health care and to assist  you  in achieving  the goals  that are most important to your wellbeing  Complex Care Management services are voluntary, and the patient / caregiver may decline or stop services at any time by request to their care team member.   Patient verbalizes understanding of instructions and care plan provided. Patient / Caregiver was encouraged to make informed decisions about their care, actively participate in managing their health condition, and implement lifestyle changes as needed to promote independence and self-management of health care. There were no reported  barriers to care.   TOC program  Patient is at  risk for readmission and / or has history of  high utilization  Discussed VBCI  TOC program and weekly calls to patient to assess condition/status,  medication management  and provide support/education as indicated . Patient  and / or Caregive voiced understanding and declined enrollment in the 30-day TOC Program at this time .  She has follow-up visits scheduled She has a SOC with [facility] SN scheduled for today 1:00pm She has been in the [facility] program with this being week 4. Unfortunately she had an admission to the hospital due to hyperglycemia secondary to steroid use She will have 1 additional call,next week to ensure [facility] Nurse began Case Management. Her PCS care at home started ( initial visit scheduled for today - she was approved for 52 hrs a week) She has multiple follow-up visits scheduled. Will defer to Case Management to avoid duplication of services after next weeks call   Verified with [facility] patient has a SOC with SN today for Med and disease management patient teaching and education   The Patient  and / or Caregiver  has been provided with contact information for the care management team and has been advised to call with any health-related questions or concerns. Patient was encouraged to Contact PCP with any questions or concerns regarding ongoing medical care, any difficulty obtaining or picking up prescriptions, any changes or worsening in condition including signs / symptoms not relieved  with interventions Patient had no additional questions or concerns at this time.     Susa Loffler , BSN, RN [facility]   VBCI-Population Health RN Care Manager Direct Dial [phone]  Fax: [phone] Website: [email or web address]

## 2024-03-16 NOTE — Patient Instructions (Signed)
 Visit Information  Thank you for taking time to visit with me today. Please don't hesitate to contact me if I can be of assistance to you before our next scheduled telephone appointment.  Our next appointment is by telephone on 03/21/24  at 2:30pm   Following is a copy of your care plan:   Goals Addressed             This Visit's Progress    TOC Care Plan       Current Barriers:  Medication management multiple changes  Provider appointments PCP Pulmonology Cardiology-  Date of PCP follow-up appointment?: 03/17/24 Follow-up Provider: Alba Cory, MD Specialist Hospital Follow-up appointment confirmed?: Yes Date of Specialist follow-up appointment?: 04/07/24 Follow-Up Specialty Provider:: [facility] CARDIOLOGY [place] Apr 07, 2024 4:20 PM-   [facility] GI MEDICINE [place] (Apr 10, 2024 12:30 PM   [facility] [place] Apr 13, 2024 3:30 PM  MRI Cardiac Morphology With and Without Contrast with [facility] Apr 25, 2024 9:30 AM Transportation Uses Benefits, [facility] Public and Family Friends  Chronic Disease Management support and education needs related to DMII and Pulmonary Disease   RNCM Clinical Goal(s):  Patient will work with the Care Management team over the next 30 days to address Transition of Care Barriers: Medication Management Support at home Provider appointments take all medications exactly as prescribed and will call provider for medication related questions as evidenced by no missed medication doses attend all scheduled medical appointments: as evidenced by no missed follow-up visits  through collaboration with RN Care manager, provider, and care team.   Interventions: Evaluation of current treatment plan related to  self management and patient's adherence to plan as established by provider  Transitions of Care:  Goal on track:  Yes. Doctor Visits  - discussed the importance of doctor visits [facility] RN/OT/PT  Post discharge activity  limitations prescribed by provider reviewed Reviewed Signs and symptoms of infection  Asthma: (Status:Goal on track:  Yes.) Short Term Goal Provided instruction about proper use of medications used for management of Asthma including inhalers Provided education about and advised patient to utilize infection prevention strategies to reduce risk of respiratory infection Discussed the importance of adequate rest and management of fatigue with Asthma Screening for signs and symptoms of depression related to chronic disease state  Assessed social determinant of health barriers   Patient Goals/Self-Care Activities: Participate in Transition of Care Program/Attend TOC scheduled calls Take all medications as prescribed Attend all scheduled provider appointments Call pharmacy for medication refills 3-7 days in advance of running out of medications Perform all self care activities independently  Perform IADL's (shopping, preparing meals, housekeeping, managing finances) independently Call provider office for new concerns or questions   Follow Up Plan:  Telephone follow up appointment with care management team member scheduled for:  03/21/24 @ 2:30pm for 1 additional call  The patient has been provided with contact information for the care management team and has been advised to call with any health related questions or concerns.        Medication review  Reviewed current home medications -- provided education as needed. Patient is aware of potential side effects and was encouraged to notify PCP for any adverse side effects or unwanted symptoms not relieved with interventions Updates noted  START taking these medications   glucose blood test strip Generic drug: blood sugar diagnostic Use to check blood sugar as directed with insulin 3 times a day & for symptoms of  high or low blood sugar.  insulin NPH 100 unit/mL injection Commonly known as: HumuLIN,NovoLIN Inject 0.05 mL (5 Units total) under  the skin daily.  insulin syringe-needle U-100 1/2 mL 31 gauge x 1/4" (6 mm) Syrg Use with insulin up to 4 times/day as needed.  lancets Misc Use to check blood sugar as directed with insulin 3 times a day & for symptoms of high or low blood sugar.   CHANGE how you take these medications   CONTOUR METER kit Generic drug: blood-glucose meter CONTOUR BLOOD GLUCOSE SYSTEM W/DEVICE KIT What changed: Another medication with the same name was added. Make sure you understand how and when to take each.  blood-glucose meter kit Use as instructed What changed: You were already taking a medication with the same name, and this prescription was added. Make sure you understand how and when to take each.   Reviewed goals for care Patient/ Caregiver verbalizes understanding of instructions with the plan of care . The  Patient / Caregiver was encouraged to make informed decisions about care, actively participate in managing health conditions, and implement lifestyle changes as needed to promote independence and self-management of healthcare. SDOH screenings have been completed and addressed if indicted.  There are no reported barriers to care.    Follow-up Plan  Ambulatory Referral  for identified needs. Please see additional details in the order section of this note  Patient  was made aware   The Complex Care Management services include support from the care team which includes  a  Pharmacist. To assist wit medication needs and concerns ( Polypharmacy)  The Complex Care Management team's purpose is to assist in removing   barriers to your health care and to assist  you  in achieving  the goals  that are most important to your wellbeing  Complex Care Management services are voluntary, and the patient / caregiver may decline or stop services at any time by request to their care team member.   TOC program  Patient is at  risk for readmission and / or has history of  high utilization  Discussed [facility]BCI   TOC program and weekly calls to patient to assess condition/status, medication management  and provide support/education as indicated . Patient  and / or Caregive voiced understanding and declined enrollment in the 30-day TOC Program at this time .  She has follow-up visits scheduled She has a SOC with [facility] SN scheduled for today 1:00pm She has been in the [facility] program with this being week 4. She will have 1 additional call,next week to ensure [facility] Nurse began Case Management. Her PCS care at home started ( initial visit scheduled for today - she was approved for 52 hrs a week) She has multiple follow-up visits scheduled. Will defer to Case Management to avoid duplication of services after next weeks call   Verified with [facility] patient has a SOC with SN today for Med and disease management patient teaching and education   The Patient   has been provided with contact information for the care management team and has been advised to call with any health-related questions or concerns. Patient was encouraged to Contact PCP with any questions or concerns regarding ongoing medical care, any difficulty obtaining or picking up prescriptions, any changes or worsening in condition including signs / symptoms not relieved  with interventions Patient had no additional questions or concerns at this time.    VBCI Case Management Nurse will provide follow-up and on-going assessment ,evaluation and  education of disease processes, recommended interventions for both chronic and acute medical conditions ,  along with ongoing review of symptoms ,medication reviews / reconciliation during each weekly call . Any updates , inconsistencies, discrepancies or acute care concerns will be addressed and routed to the correct Practitioner if indicated   The patient has been provided with contact information for the care management team and has been advised to call with any health-related questions or concerns. Follow up call   with Care Team  as scheduled,or sooner should any new problems arise.  [facility]  Please call the care guide team at [phone]  if you need to cancel or reschedule your appointment . For scheduled calls -Three attempts will be made to reach you -if the scheduled call is missed or  we are unable to reach the you after 3 attempts no additional outreach attempts will be made and the TOC follow-up will be closed .   If you need to speak to a Nurse you may  call me directly at the number below or if I am unavailable,and  your need is urgent  please call the main VBCI number at [phone] and ask to speak with one of the [facility] ( Transition of Care )  Nurses  .  Patient was encouraged to Contact PCP with any changes in baseline or  medication regimen,  changes in health status  /  well-being, safety concerns, including falls any questions or concerns regarding ongoing medical care, any difficulty obtaining or picking up prescriptions, any changes or worsening in condition- including  symptoms not relieved  with interventions                                                                            Additionally, If you experience worsening of your symptoms, develop shortness of breath, If you are experiencing a medical emergency,  develop suicidal or homicidal thoughts you must seek medical attention immediately by calling 911 or report to your local emergency department or urgent care.   If you have a non-emergency medical problem during routine business hours, please contact your provider's office and ask to speak with a nurse.       Please take the time to read instructions/literature along with the possible adverse reactions/side effects for all the Medicines that have been prescribed to you. Only take newly prescribed  Medications after you have completely understood and accept all the possible adverse reactions/side effects.   Do not take more than prescribed Medications for  Pain,  Sleep and Anxiety. Do not drive when taking Pain medications or sleep aid/ insomnia  medications It is not advisable to combine anxiety, sleep and pain medications without talking with your primary care practitioner    If you are experiencing a Mental Health or Behavioral Health Crisis or need someone to talk to Please call the Suicide and Crisis Lifeline: 988 You may also call the [place] National Suicide Prevention Lifeline: (215) 436-5814 or TTY: 540-619-5346 TTY 682-365-2377) to talk to a trained counselor.  You may call the Behavioral Health Crisis Line at 984-394-6656, at any time, 24 hours a day, 7 days a week- however If you are in danger or need immediate medical  attention, call 911.   If you would like help to quit smoking, call 1-800-QUIT-NOW ( 7251160245) OR Espaol: 1-855-Djelo-Ya (9-811-914-7829) o para ms informacin haga clic aqu or Text READY to 562-130 to register via text.   Susa Loffler , BSN, RN [place]   [facility]-Population Health RN Care Manager Direct Dial [phone]  Website: [email or web address]

## 2024-03-16 NOTE — Addendum Note (Signed)
 Addended by: Johnnette Barrios on: 03/16/2024 12:23 PM   Modules accepted: Orders

## 2024-03-17 ENCOUNTER — Encounter: Payer: Self-pay | Admitting: Family Medicine

## 2024-03-17 ENCOUNTER — Ambulatory Visit (INDEPENDENT_AMBULATORY_CARE_PROVIDER_SITE_OTHER): Admitting: Family Medicine

## 2024-03-17 ENCOUNTER — Other Ambulatory Visit: Payer: Self-pay

## 2024-03-17 VITALS — BP 116/74 | HR 85 | Resp 16 | Ht 60.0 in | Wt 160.2 lb

## 2024-03-17 DIAGNOSIS — Z09 Encounter for follow-up examination after completed treatment for conditions other than malignant neoplasm: Secondary | ICD-10-CM | POA: Diagnosis not present

## 2024-03-17 DIAGNOSIS — E1165 Type 2 diabetes mellitus with hyperglycemia: Secondary | ICD-10-CM | POA: Diagnosis not present

## 2024-03-17 DIAGNOSIS — Z794 Long term (current) use of insulin: Secondary | ICD-10-CM | POA: Diagnosis not present

## 2024-03-17 MED ORDER — PANTOPRAZOLE SODIUM 40 MG PO TBEC: 40.0000 mg | DELAYED_RELEASE_TABLET | Freq: Two times a day (BID) | ORAL | 0 refills | Status: DC

## 2024-03-17 NOTE — Patient Instructions (Signed)
 Avoid sweets, any sweet beverages, pasta, rice, potatoes, bread Try to have eggs for breakfast, by Glucerna or Premier shakes Drink water or unsweetened drinks Avoid cereal for breakfast

## 2024-03-17 NOTE — Progress Notes (Signed)
 Name: Brandy Johnston   MRN: [id number]    DOB: [birth year]   Date:03/17/2024       Progress Note  Subjective  Chief Complaint  Chief Complaint  Patient presents with   Hospitalization Follow-up    Feeling weak and unable to taste food well   Discussed the use of AI scribe software for clinical note transcription with the patient, who gave verbal consent to proceed.  History of Present Illness Brandy Johnston is a 63 year old female with diabetes who presents for a hospital follow-up due to high blood sugar levels.  She was recently hospitalized from March 15 to March 13, 2024, due to high blood sugar levels, which were attributed to long-term steroid use. Her blood sugar was 340 mg/dL and increased to 409 mg/dL on the day of admission.   She experienced confusion and unusual behavior, which she attributes to quetiapine prescribed by her neurologist. She took quetiapine for two days before the onset of symptoms, which included confusion, falling, and unusual behavior such as drinking beer and putting a durag in the refrigerator. She also experienced vomiting and soreness from falls. She has since discontinued quetiapine.  Her current diabetes management includes taking Jardiance, Ozempic 0.5 mg  and NPH insulin at 5 units. Her blood sugar was 249 mg/dL this morning. She has associated CKI with some GFR drop during hospital stay and we will repeat labs   She has a history of long-term prednisone use for respiratory issues, which she has been tapering off since her hospital discharge. Her prednisone dose was reduced to 10 mg upon discharge, with plans to further reduce it. She has been on prednisone since December 2024 due to respiratory issues.  She mentions a significant weight loss from 172 lbs to 157 lbs and reports a lack of appetite, stating sometimes she doesn't feel like eating. She is concerned about her energy levels and inquires about a B12 shot, although she does not have a B12  deficiency.    Patient Active Problem List   Diagnosis Date Noted   Small vessel disease (HCC) 01/26/2024   Eosinophilic asthma 01/18/2024   Muscle spasms of both lower extremities 01/18/2024   Chronic left shoulder pain 11/09/2022   Hyperlipidemia 01/14/2021   Incomplete tear of left rotator cuff 07/03/2020   Angioedema due to angiotensin converting enzyme inhibitor (ACE-I) 01/29/2020   Chronic bilateral back pain 07/26/2017   Coronary artery calcification 05/17/2017   Heart palpitations 05/15/2017   History of acute gastritis    Leukocytosis 09/30/2016   Atherosclerosis of abdominal aorta (HCC) 08/18/2016   Type 2 diabetes mellitus with stage 3 chronic kidney disease, without long-term current use of insulin (HCC) 12/16/2015   Diabetic neuropathy associated with type 2 diabetes mellitus (HCC) 09/18/2015   Insomnia 09/18/2015   Benign essential HTN 06/18/2015   Chronic kidney disease (CKD), stage III (moderate) (HCC) 06/18/2015   Diabetes mellitus with renal manifestation (HCC) 06/18/2015   Elevated CK 06/18/2015   Obesity (BMI 30.0-34.9) 06/18/2015   Degenerative arthritis of hip 06/18/2015   Sickle cell trait (HCC) 06/18/2015   Dyslipidemia 05/27/2010   Gastroesophageal reflux disease 05/25/2008    Past Surgical History:  Procedure Laterality Date   BREAST BIOPSY Right 12/28/2019   stereo [facility] stromal fibrosis   CHOLECYSTECTOMY     COLONOSCOPY     ESOPHAGOGASTRODUODENOSCOPY (EGD) WITH PROPOFOL N/A 10/05/2016   Procedure: ESOPHAGOGASTRODUODENOSCOPY (EGD) WITH PROPOFOL;  Surgeon: Midge Minium, MD;  Location: [facility];  Service: Endoscopy;  Laterality: N/A;   FRACTURE SURGERY Left    cast and pins    SHOULDER ARTHROSCOPY WITH ROTATOR CUFF REPAIR AND SUBACROMIAL DECOMPRESSION Left 12/07/2018   Procedure: left shoulder manipulation under anesthesia, left shoulder arthroscopic lysis of adhesions;  Surgeon: Lyndle Herrlich, MD;  Location: [facility];  Service:  Orthopedics;  Laterality: Left;   SHOULDER CLOSED REDUCTION Left 12/07/2018   Procedure: CLOSED MANIPULATION SHOULDER;  Surgeon: Lyndle Herrlich, MD;  Location: [facility];  Service: Orthopedics;  Laterality: Left;   shoulder surgery  Left 06/10/2018   Dr. Derryl Harbor   TUBAL LIGATION      Family History  Problem Relation Age of Onset   Migraines Mother    Diabetes Mother    Cancer Mother        lung   Arthritis Brother    Breast cancer Maternal Aunt    Cancer Maternal Uncle        Lung and Colon   Cirrhosis Brother    Breast cancer Cousin     Social History   Tobacco Use   Smoking status: Never   Smokeless tobacco: Never  Substance Use Topics   Alcohol use: Yes    Alcohol/week: 0.0 standard drinks of alcohol    Comment: rare     Current Outpatient Medications:    albuterol (VENTOLIN HFA) 108 (90 Base) MCG/ACT inhaler, SMARTSIG:2 Puff(s) By Mouth Every 4-6 Hours PRN, Disp: , Rfl:    aspirin (ASPIRIN 81) 81 MG chewable tablet, Chew 1 tablet (81 mg total) by mouth daily., Disp: 30 tablet, Rfl: 0   Atogepant (QULIPTA) 30 MG TABS, Take 1 tablet (30 mg total) by mouth daily at 12 noon., Disp: 30 tablet, Rfl: 2   Blood Glucose Monitoring Suppl (CONTOUR NEXT ONE) KIT, USE TO TEST BLOOD SUGAR ONCE D, Disp: , Rfl:    Budeson-Glycopyrrol-Formoterol (BREZTRI AEROSPHERE) 160-9-4.8 MCG/ACT AERO, Inhale 2 puffs into the lungs in the morning and at bedtime., Disp: , Rfl:    butalbital-acetaminophen-caffeine (FIORICET) 50-325-40 MG tablet, Take 1 tablet by mouth every 6 (six) hours as needed for headache., Disp: 20 tablet, Rfl: 0   Cholecalciferol (VITAMIN D) 2000 units CAPS, Take 1 capsule (2,000 Units total) by mouth daily., Disp: 30 capsule, Rfl: 0   Continuous Glucose Sensor (FREESTYLE LIBRE 3 SENSOR) MISC, 1 Device by Does not apply route every 14 (fourteen) days., Disp: 6 each, Rfl: 1   cyclobenzaprine (FLEXERIL) 5 MG tablet, Take 1 tablet (5 mg total) by mouth at bedtime., Disp: 30 tablet,  Rfl: 0   diltiazem (CARDIZEM CD) 180 MG 24 hr capsule, Take 1 capsule (180 mg total) by mouth daily., Disp: 90 capsule, Rfl: 1   empagliflozin (JARDIANCE) 25 MG TABS tablet, Take 1 tablet (25 mg total) by mouth daily before breakfast., Disp: 90 tablet, Rfl: 0   ezetimibe (ZETIA) 10 MG tablet, Take 1 tablet (10 mg total) by mouth daily., Disp: 90 tablet, Rfl: 3   fluticasone (FLONASE) 50 MCG/ACT nasal spray, 2 sprays each nostril Daily. DISP#  1 bottles = 1 month supply., Disp: , Rfl:    gabapentin (NEURONTIN) 300 MG capsule, TAKE 1 CAPSULE(300 MG) BY MOUTH THREE TIMES DAILY, Disp: 270 capsule, Rfl: 1   glucose blood test strip, Use as instructed, Disp: 100 each, Rfl: 12   insulin NPH Human (NOVOLIN N) 100 UNIT/ML injection, Inject into the skin., Disp: , Rfl:    ipratropium-albuterol (DUONEB) 0.5-2.5 (3) MG/3ML SOLN, Take 3 mLs by nebulization every 6 (six) hours as  needed., Disp: 360 mL, Rfl: 1   loratadine (CLARITIN) 10 MG tablet, Take 1 tablet (10 mg total) by mouth daily., Disp: 90 tablet, Rfl: 1   magnesium oxide (MAG-OX) 400 MG tablet, Take 1 tablet (400 mg total) by mouth 2 (two) times daily., Disp: 180 tablet, Rfl: 1   meclizine (ANTIVERT) 25 MG tablet, Take 1 tablet (25 mg total) by mouth 3 (three) times daily as needed for dizziness., Disp: 30 tablet, Rfl: 0   Melatonin 10 MG TABS, Take by mouth., Disp: , Rfl:    metoprolol succinate (TOPROL-XL) 25 MG 24 hr tablet, Take 1 tablet (25 mg total) by mouth daily., Disp: 90 tablet, Rfl: 0   Microlet Lancets MISC, USE TO CHECK BLOOD SUGAR ONCE D, Disp: , Rfl:    ondansetron (ZOFRAN-ODT) 4 MG disintegrating tablet, Take 1 tablet (4 mg total) by mouth every 8 (eight) hours as needed for nausea., Disp: 30 tablet, Rfl: 0   pantoprazole (PROTONIX) 40 MG tablet, Take 40 mg by mouth 2 (two) times daily., Disp: , Rfl:    predniSONE (DELTASONE) 10 MG tablet, Take 10 mg by mouth daily with breakfast. 60 mg daily, plan for 7-days then taper by 10mg  every 7  days., Disp: , Rfl:    promethazine (PHENERGAN) 25 MG tablet, Take 1 tablet (25 mg total) by mouth every 8 (eight) hours as needed for nausea or vomiting., Disp: 20 tablet, Rfl: 0   rosuvastatin (CRESTOR) 40 MG tablet, TAKE 1 TABLET(40 MG) BY MOUTH DAILY, Disp: 90 tablet, Rfl: 3   Semaglutide,0.25 or 0.5MG /DOS, (OZEMPIC, 0.25 OR 0.5 MG/DOSE,) 2 MG/3ML SOPN, Inject 0.5 mg into the skin once a week., Disp: 6 mL, Rfl: 0   sodium chloride 0.9 % nebulizer solution, Inhale into the lungs., Disp: , Rfl:    Spacer/Aero-Holding Chambers (OPTICHAMBER DIAMOND) MISC, , Disp: , Rfl:    sulfamethoxazole-trimethoprim (BACTRIM DS) 800-160 MG tablet, Take 1 tablet by mouth daily. MWF bactrim PJP prophylaxis, Disp: , Rfl:    triamcinolone cream (KENALOG) 0.1 %, Apply 1 Application topically 2 (two) times daily., Disp: 453.6 g, Rfl: 0   Ubrogepant (UBRELVY) 100 MG TABS, Take 1 tablet (100 mg total) by mouth daily as needed., Disp: 16 tablet, Rfl: 0   amLODipine-valsartan (EXFORGE) 5-160 MG tablet, daily. (Patient not taking: Reported on 03/17/2024), Disp: , Rfl:    benralizumab (FASENRA PEN) 30 MG/ML prefilled autoinjector, Inject into the skin. (Patient not taking: Reported on 02/24/2024), Disp: , Rfl:    diclofenac Sodium (VOLTAREN) 1 % GEL, Apply 2 g topically 4 (four) times daily. (Patient not taking: Reported on 03/17/2024), Disp: 100 g, Rfl: 2   famotidine (PEPCID) 20 MG tablet, Take 1 tablet (20 mg total) by mouth 2 (two) times daily. (Patient not taking: Reported on 03/17/2024), Disp: 180 tablet, Rfl: 1   montelukast (SINGULAIR) 10 MG tablet, Take by mouth. (Patient not taking: Reported on 03/17/2024), Disp: , Rfl:    nystatin (MYCOSTATIN) 100000 UNIT/ML suspension, Take 5 mLs (500,000 Units total) by mouth 4 (four) times daily. (Patient not taking: Reported on 03/17/2024), Disp: 60 mL, Rfl: 1   ondansetron (ZOFRAN) 4 MG tablet, Take by mouth. (Patient not taking: Reported on 03/17/2024), Disp: , Rfl:    traZODone  (DESYREL) 50 MG tablet, TAKE 1 TABLET(50 MG) BY MOUTH AT BEDTIME (Patient not taking: Reported on 03/17/2024), Disp: 90 tablet, Rfl: 0  Allergies  Allergen Reactions   Ace Inhibitors Swelling    Angioedema    Lisinopril Swelling  Face and neck swelling    I personally reviewed active problem list, medication list, allergies with the patient/caregiver today.   ROS  Ten systems reviewed and is negative except as mentioned in HPI    Objective  Vitals:   03/17/24 1408  BP: 116/74  Pulse: 85  Resp: 16  SpO2: 97%  Weight: 160 lb 3.2 oz (72.7 kg)  Height: 5' (1.524 m)    Body mass index is 31.29 kg/m.  Physical Exam  Constitutional: Patient appears well-developed and well-nourished. Obese  No distress. Cushingoid face HEENT: head atraumatic, normocephalic, pupils equal and reactive to light, neck supple Cardiovascular: Normal rate, regular rhythm and normal heart sounds.  No murmur heard. No BLE edema. Pulmonary/Chest: Effort normal and breath sounds normal. No respiratory distress. Abdominal: Soft.  There is no tenderness. Psychiatric: Patient has a normal mood and affect. behavior is normal. Judgment and thought content normal.   Recent Results (from the past 2160 hours)  POCT Glucose (CBG)     Status: Abnormal   Collection Time: 01/14/24  9:56 AM  Result Value Ref Range   POC Glucose 162 (A) 70 - 99 mg/dl  Lipid panel     Status: Abnormal   Collection Time: 01/18/24 12:15 PM  Result Value Ref Range   Cholesterol 179 <200 mg/dL   HDL 92 > OR = 50 mg/dL   Triglycerides 098 (H) <150 mg/dL    Comment: . If a non-fasting specimen was collected, consider repeat triglyceride testing on a fasting specimen if clinically indicated.  Perry Mount et al. J. of Clin. Lipidol. 2015;9:129-169. Marland Kitchen    LDL Cholesterol (Calc) 60 mg/dL (calc)    Comment: Reference range: <100 . Desirable range <100 mg/dL for primary prevention;   <70 mg/dL for patients with CHD or diabetic patients   with > or = 2 CHD risk factors. Marland Kitchen LDL-C is now calculated using the Martin-Hopkins  calculation, which is a validated novel method providing  better accuracy than the Friedewald equation in the  estimation of LDL-C.  Horald Pollen et al. Lenox Ahr. 1191;478(29): 2061-2068  (http://education.[facility].com/faq/FAQ164)    Total CHOL/HDL Ratio 1.9 <5.0 (calc)   Non-HDL Cholesterol (Calc) 87 <562 mg/dL (calc)    Comment: For patients with diabetes plus 1 major ASCVD risk  factor, treating to a non-HDL-C goal of <100 mg/dL  (LDL-C of <13 mg/dL) is considered a therapeutic  option.   Microalbumin / creatinine urine ratio     Status: Abnormal   Collection Time: 01/18/24 12:15 PM  Result Value Ref Range   Creatinine, Urine 135 20 - 275 mg/dL   Microalb, Ur 9.6 mg/dL    Comment: Reference Range Not established    Microalb Creat Ratio 71 (H) <30 mg/g creat    Comment: . The ADA defines abnormalities in albumin excretion as follows: Marland Kitchen Albuminuria Category        Result (mg/g creatinine) . Normal to Mildly increased   <30 Moderately increased         30-299  Severely increased           > OR = 300 . The ADA recommends that at least two of three specimens collected within a 3-6 month period be abnormal before considering a patient to be within a diagnostic category.   CBC with Differential/Platelet     Status: Abnormal   Collection Time: 01/18/24 12:15 PM  Result Value Ref Range   WBC 10.7 3.8 - 10.8 Thousand/uL   RBC 4.96 3.80 - 5.10 Million/uL  Hemoglobin 13.7 11.7 - 15.5 g/dL   HCT 40.1 02.7 - 25.3 %   MCV 84.5 80.0 - 100.0 fL   MCH 27.6 27.0 - 33.0 pg   MCHC 32.7 32.0 - 36.0 g/dL    Comment: For adults, a slight decrease in the calculated MCHC value (in the range of 30 to 32 g/dL) is most likely not clinically significant; however, it should be interpreted with caution in correlation with other red cell parameters and the patient's clinical condition.    RDW 15.0 11.0  - 15.0 %   Platelets 138 (L) 140 - 400 Thousand/uL   MPV 11.3 7.5 - 12.5 fL   Neutro Abs 7,404 1,500 - 7,800 cells/uL   Absolute Lymphocytes 2,279 850 - 3,900 cells/uL   Absolute Monocytes 610 200 - 950 cells/uL   Eosinophils Absolute 342 15 - 500 cells/uL   Basophils Absolute 64 0 - 200 cells/uL   Neutrophils Relative % 69.2 %   Total Lymphocyte 21.3 %   Monocytes Relative 5.7 %   Eosinophils Relative 3.2 %   Basophils Relative 0.6 %   Smear Review      Comment: Review of peripheral smear confirms automated results.   COMPLETE METABOLIC PANEL WITH GFR     Status: Abnormal   Collection Time: 01/18/24 12:15 PM  Result Value Ref Range   Glucose, Bld 189 (H) 65 - 99 mg/dL    Comment: .            Fasting reference interval . For someone without known diabetes, a glucose value >125 mg/dL indicates that they may have diabetes and this should be confirmed with a follow-up test. .    BUN 15 7 - 25 mg/dL   Creat 6.64 (H) 4.03 - 1.05 mg/dL   eGFR 41 (L) > OR = 60 mL/min/1.8m2   BUN/Creatinine Ratio 10 6 - 22 (calc)   Sodium 139 135 - 146 mmol/L   Potassium 3.7 3.5 - 5.3 mmol/L   Chloride 103 98 - 110 mmol/L   CO2 26 20 - 32 mmol/L   Calcium 9.2 8.6 - 10.4 mg/dL   Total Protein 6.9 6.1 - 8.1 g/dL   Albumin 4.1 3.6 - 5.1 g/dL   Globulin 2.8 1.9 - 3.7 g/dL (calc)   AG Ratio 1.5 1.0 - 2.5 (calc)   Total Bilirubin 0.5 0.2 - 1.2 mg/dL   Alkaline phosphatase (APISO) 121 37 - 153 U/L   AST 27 10 - 35 U/L   ALT 42 (H) 6 - 29 U/L  CK (Creatine Kinase)     Status: None   Collection Time: 01/18/24 12:15 PM  Result Value Ref Range   Total CK 127 20 - 243 U/L  BASIC METABOLIC PANEL WITH GFR     Status: Abnormal   Collection Time: 02/02/24  8:08 AM  Result Value Ref Range   Glucose, Bld 87 65 - 99 mg/dL    Comment: .            Fasting reference interval .    BUN 5 (L) 7 - 25 mg/dL    Comment: Verified by repeat analysis. .    Creat 1.39 (H) 0.50 - 1.05 mg/dL    Comment:  Verified by repeat analysis. Marland Kitchen    eGFR 42 (L) > OR = 60 mL/min/1.45m2   BUN/Creatinine Ratio 4 (L) 6 - 22 (calc)   Sodium 143 135 - 146 mmol/L   Potassium 3.8 3.5 - 5.3 mmol/L   Chloride 106 98 -  110 mmol/L   CO2 28 20 - 32 mmol/L   Calcium 9.3 8.6 - 10.4 mg/dL    Diabetic Foot Exam:     PHQ2/9:    03/17/2024    2:03 PM 02/04/2024   10:09 AM 01/18/2024   11:27 AM 12/09/2023   11:21 AM 11/18/2023    9:23 AM  Depression screen PHQ 2/9  Decreased Interest 0 0 0 0 0  Down, Depressed, Hopeless 0 0 0 0 0  PHQ - 2 Score 0 0 0 0 0  Altered sleeping 0 0 0  0  Tired, decreased energy 0 0 0  0  Change in appetite 0 0 0  0  Feeling bad or failure about yourself  0 0 0  0  Trouble concentrating 0 0 0  0  Moving slowly or fidgety/restless 0 0 0  0  Suicidal thoughts 0 0 0  0  PHQ-9 Score 0 0 0  0  Difficult doing work/chores Not difficult at all Not difficult at all Not difficult at all      phq 9 is negative  Fall Risk:    01/18/2024   11:17 AM 11/18/2023    9:23 AM 09/15/2023    9:26 AM 09/09/2023    9:37 AM 09/08/2023    9:50 AM  Fall Risk   Falls in the past year? 0 0 0 0 0  Number falls in past yr: 0  0 0 0  Injury with Fall? 0  0 0 0  Risk for fall due to : No Fall Risks No Fall Risks No Fall Risks No Fall Risks No Fall Risks  Follow up Falls prevention discussed;Education provided;Falls evaluation completed Falls prevention discussed Falls prevention discussed;Education provided;Falls evaluation completed Falls prevention discussed;Education provided;Falls evaluation completed Falls prevention discussed     Assessment and Plan Assessment & Plan Hyperglycemia secondary to steroid use Hospitalized due to hyperglycemia with blood glucose levels reaching 550 mg/dL, likely exacerbated by long-term prednisone use for respiratory issues. Currently on NPH insulin and Jardiance, with blood glucose levels at 249 mg/dL this morning. Plan to manage hyperglycemia until prednisone can  be discontinued, at which point insulin may be reduced or stopped. Also on Ozempic, which may be increased to 1 mg if tolerated, to help manage blood glucose levels, however since she will be stopping prednisone soon we will hold off on adjusting dose at this time. Emphasized the importance of a low carbohydrate diet to prevent further complications and the need to avoid high sugar and high carbohydrate foods. Discussed that once prednisone is discontinued, blood glucose levels should improve, allowing for potential reintroduction of some carbohydrates. - Continue NPH insulin and Jardiance - Increase Ozempic to 1 mg if tolerated - Discontinue prednisone as per pulmonologist's plan - Adopt a low carbohydrate diet, avoiding high sugar and high carbohydrate foods - Refer to a dietician for dietary guidance  Confusion and somnolence Experienced confusion and somnolence, potentially due to quetiapine, high glucose levels, or prednisone. Quetiapine was started two days before symptoms began, and she reported severe confusion and somnolence. Differential diagnosis includes quetiapine-induced somnolence, hyperglycemia-induced encephalopathy, or prednisone-induced psychosis. Quetiapine has been discontinued, and it is noted as a medication to avoid in the future. - Discontinue quetiapine - Avoid quetiapine in the future   Medication reconciliation Has a complex medication regimen with multiple changes during recent hospitalizations. There is a need for a comprehensive review to ensure accuracy and appropriateness of current medications. Emphasized the importance of bringing all  medication bottles to the next appointment for reconciliation. - Bring all medication bottles to the next appointment for reconciliation - Schedule an appointment for medication reconciliation  Follow-up Requires follow-up for medication reconciliation and to monitor blood glucose levels and overall health status. Will need a  comprehensive metabolic panel to monitor kidney and liver function due to previous abnormalities. - Schedule follow-up appointment for medication reconciliation - Order comprehensive metabolic panel to monitor kidney and liver function - Monitor blood glucose levels regularly

## 2024-03-18 LAB — COMPLETE METABOLIC PANEL WITH GFR
AG Ratio: 1.5 (calc) (ref 1.0–2.5)
ALT: 69 U/L — ABNORMAL HIGH (ref 6–29)
AST: 49 U/L — ABNORMAL HIGH (ref 10–35)
Albumin: 4.4 g/dL (ref 3.6–5.1)
Alkaline phosphatase (APISO): 177 U/L — ABNORMAL HIGH (ref 37–153)
BUN/Creatinine Ratio: 10 (calc) (ref 6–22)
BUN: 18 mg/dL (ref 7–25)
CO2: 25 mmol/L (ref 20–32)
Calcium: 9.6 mg/dL (ref 8.6–10.4)
Chloride: 101 mmol/L (ref 98–110)
Creat: 1.86 mg/dL — ABNORMAL HIGH (ref 0.50–1.05)
Globulin: 2.9 g/dL (ref 1.9–3.7)
Glucose, Bld: 220 mg/dL — ABNORMAL HIGH (ref 65–99)
Potassium: 4.5 mmol/L (ref 3.5–5.3)
Sodium: 137 mmol/L (ref 135–146)
Total Bilirubin: 0.9 mg/dL (ref 0.2–1.2)
Total Protein: 7.3 g/dL (ref 6.1–8.1)

## 2024-03-20 ENCOUNTER — Other Ambulatory Visit: Payer: Self-pay | Admitting: Pharmacist

## 2024-03-20 ENCOUNTER — Other Ambulatory Visit: Payer: Self-pay | Admitting: Family Medicine

## 2024-03-20 ENCOUNTER — Encounter: Payer: Self-pay | Admitting: Family Medicine

## 2024-03-20 DIAGNOSIS — J45909 Unspecified asthma, uncomplicated: Secondary | ICD-10-CM

## 2024-03-20 DIAGNOSIS — E1142 Type 2 diabetes mellitus with diabetic polyneuropathy: Secondary | ICD-10-CM

## 2024-03-20 DIAGNOSIS — R112 Nausea with vomiting, unspecified: Secondary | ICD-10-CM

## 2024-03-20 DIAGNOSIS — G43011 Migraine without aura, intractable, with status migrainosus: Secondary | ICD-10-CM

## 2024-03-20 DIAGNOSIS — J42 Unspecified chronic bronchitis: Secondary | ICD-10-CM

## 2024-03-20 DIAGNOSIS — G4709 Other insomnia: Secondary | ICD-10-CM

## 2024-03-20 DIAGNOSIS — R21 Rash and other nonspecific skin eruption: Secondary | ICD-10-CM

## 2024-03-20 DIAGNOSIS — R053 Chronic cough: Secondary | ICD-10-CM

## 2024-03-20 DIAGNOSIS — R062 Wheezing: Secondary | ICD-10-CM

## 2024-03-20 DIAGNOSIS — K219 Gastro-esophageal reflux disease without esophagitis: Secondary | ICD-10-CM

## 2024-03-20 DIAGNOSIS — E1129 Type 2 diabetes mellitus with other diabetic kidney complication: Secondary | ICD-10-CM

## 2024-03-20 DIAGNOSIS — N1831 Chronic kidney disease, stage 3a: Secondary | ICD-10-CM

## 2024-03-20 DIAGNOSIS — I700 Atherosclerosis of aorta: Secondary | ICD-10-CM

## 2024-03-20 DIAGNOSIS — R790 Abnormal level of blood mineral: Secondary | ICD-10-CM

## 2024-03-20 NOTE — Progress Notes (Unsigned)
 03/20/2024 Name: Brandy Johnston MRN: [id number] DOB: 1961-08-21  Chief Complaint  Patient presents with   Medication Management    Brandy Johnston is a 63 y.o. year old female who presented for a telephone visit.   They were referred to the pharmacist by their PCP for assistance in managing complex medication management.    Subjective:  Care Team: Primary Care Provider: Alba Cory, MD ; Next Scheduled Visit: 03/21/24 Clinical Pharmacist: Brandy Johnston, PharmD  Medication Access/Adherence  Current Pharmacy:  [facility] [phone] Brandy Johnston, [place] - [address] AT [facility] OF [address] [place] [address] [place] [address] Phone: [phone] Fax: [phone]  [facility] - [place], [place] - [address] [facility] AT Portal to [facility] [address] [place] [address] Phone: [phone] Fax: [phone]   Patient reports affordability concerns with their medications: No  Patient reports access/transportation concerns to their pharmacy: No  Patient reports adherence concerns with their medications:  No     Medication Management:  Current adherence strategy: Pillbox for some and correct placement on dressers "Studied discharge paper" to ensure she was taking her medications correctly    Severe eosinophilic asthma: Brandy Johnston (starting first injection today on 3/24), albuterol as needed, Duoneb every 6 hours as needed, Prednisone 5mg  (on taper to remove), sodium chloride 0.9% nebulizer soln  *Fasenra info: 30 mg every 4 weeks for the first 3 doses, then once every 8 weeks. A minimum of 4 months of treatment is suggested to determine efficacy  Migraine: Quilipta daily, Fioricet as needed, Ubrelvy as needed  Neuropathy + insomnia + muscle cramps: Gabapentin 300mg - 2 in AM + 2 in PM; written to take as 300 mg in the morning and 600 mg at night for one week, then increase to 300 mg in the morning  and 900 mg at night for one week, then increase to 300 mg in the morning and 1200 mg at night (per [facility] on 3/20)  ASCVD + hypertrophic cardiomyopathy: Aspirin 81mg  daily, Dilitazem 180mg  daily, Toprol XL 12.5mg  daily (1/2 tab)- script needs updated  *Diltiazem was started for HTN after angioedema with lisinopril  Hyperlipidemia: Ezetimibe 10mg  daily, Rosuvastatin 40mg  daily  T2DM: Jardiance 25mg  daily, Ozempic 0.5mg  weekly (on Mondays), NPH 5 U in AM + 5U in PM (if BG>250)  *Stopped metformin due to "nausea and inability to taste food"; however she reports this a current issue as well  GERD: Pantoprazole 40mg  twice a day, Famotidine 20mg  twice a day   Allergies: Flonase, Loratadine, Montelukast  Supplements: Magnesium oxide 400mg  twice a day, Melatonin nightly, Vitamin D 2000U daily  Miscellaneous: Meclizine 25mg  as needed, Zofran 4mg  as needed, Promethazine 25mg  as needed    All Pulmonary medications (except Breztri + Duoneb) + NPH insulin come from [facility]; all others are from [facility]   Objective:  Lab Results  Component Value Date   HGBA1C 6.5 (A) 09/08/2023    Lab Results  Component Value Date   CREATININE 1.86 (H) 03/17/2024   BUN 18 03/17/2024   NA 137 03/17/2024   K 4.5 03/17/2024   CL 101 03/17/2024   CO2 25 03/17/2024    Lab Results  Component Value Date   CHOL 179 01/18/2024   HDL 92 01/18/2024   LDLCALC 60 01/18/2024   TRIG 203 (H) 01/18/2024   CHOLHDL 1.9 01/18/2024    Medications Reviewed Today   Medications were  not reviewed in this encounter       Assessment/Plan:   Medication Management: - Plans to start adherence packaging with [employer] - Patient bringing medications to office visit with PCP tomorrow to update medication list appropriately  Could potentially remove:  Meclizine 25mg  as needed, Zofran 4mg  as needed, Promethazine 25mg  as needed, Magnesium oxide 400mg  twice a day, Melatonin  nightly, Flonase, Montelukast NPH 5 U in AM + 5U in PM (if BG>250) once Prednisone 5mg  stopped in 1 week; likely increase Ozempic to 1mg    Follow Up Plan:  - Follow-up in 1 month with Brandy Johnston to see how patient is doing with medications and if started with [facility] packaging + check on BG readings to see about increasing Ozempic (need for insulin continuation?) - Patient planning to get started with [facility] for compliance packaging- was told they accept her insurance - Provided recommendation to PCP regarding which medications could potentially be removed   Brandy Johnston, PharmD [facility]  Phone Number: [phone]

## 2024-03-21 ENCOUNTER — Ambulatory Visit (INDEPENDENT_AMBULATORY_CARE_PROVIDER_SITE_OTHER): Admitting: Family Medicine

## 2024-03-21 ENCOUNTER — Other Ambulatory Visit: Payer: Self-pay | Admitting: Family Medicine

## 2024-03-21 ENCOUNTER — Telehealth: Payer: Self-pay

## 2024-03-21 ENCOUNTER — Other Ambulatory Visit: Payer: Self-pay

## 2024-03-21 ENCOUNTER — Encounter: Payer: Self-pay | Admitting: Family Medicine

## 2024-03-21 ENCOUNTER — Ambulatory Visit: Payer: Self-pay

## 2024-03-21 VITALS — BP 110/70 | HR 106 | Resp 16 | Ht 60.0 in | Wt 160.2 lb

## 2024-03-21 DIAGNOSIS — R112 Nausea with vomiting, unspecified: Secondary | ICD-10-CM

## 2024-03-21 DIAGNOSIS — Z79899 Other long term (current) drug therapy: Secondary | ICD-10-CM | POA: Diagnosis not present

## 2024-03-21 DIAGNOSIS — B370 Candidal stomatitis: Secondary | ICD-10-CM

## 2024-03-21 DIAGNOSIS — E1129 Type 2 diabetes mellitus with other diabetic kidney complication: Secondary | ICD-10-CM

## 2024-03-21 DIAGNOSIS — R748 Abnormal levels of other serum enzymes: Secondary | ICD-10-CM | POA: Diagnosis not present

## 2024-03-21 DIAGNOSIS — G43011 Migraine without aura, intractable, with status migrainosus: Secondary | ICD-10-CM

## 2024-03-21 DIAGNOSIS — I422 Other hypertrophic cardiomyopathy: Secondary | ICD-10-CM

## 2024-03-21 MED ORDER — FREESTYLE LIBRE 3 SENSOR MISC: 1.0000 | 1 refills | Status: DC

## 2024-03-21 MED ORDER — FREESTYLE LIBRE 3 READER DEVI: 1.0000 | 0 refills | Status: DC

## 2024-03-21 MED ORDER — NYSTATIN 100000 UNIT/ML MT SUSP: 5.0000 mL | Freq: Four times a day (QID) | OROMUCOSAL | 1 refills | Status: DC

## 2024-03-21 NOTE — Telephone Encounter (Signed)
 Copied from CRM [id number]. Topic: Clinical - Medication Question >> Mar 20, 2024  5:08 PM Shelah Lewandowsky wrote: Reason for CRM: Vernona Rieger with [facility]- patient qualifies for [facility] - needs sensors and receiver- please call patient [phone] Patient has had a lot of nausea and vomiting- needs to go over her meds

## 2024-03-21 NOTE — Patient Instructions (Signed)
 Visit Information  Thank you for taking time to visit with me today. Please don't hesitate to contact me if I can be of assistance to you before our next scheduled telephone appointment.  Our next appointment is by telephone on 03/28/24 at 2:30pm   Following is a copy of your care plan:   Goals Addressed             This Visit's Progress    TOC Care Plan       Current Barriers:  Medication management multiple changes  Provider appointments PCP Pulmonology Cardiology-  Date of PCP follow-up appointment?: 03/17/24 Follow-up Provider: Alba Cory, MD Specialist Hospital Follow-up appointment confirmed?: Yes Date of Specialist follow-up appointment?: 04/07/24 Follow-Up Specialty Provider:: [facility]ARDIOLOGY [place] Apr 07, 2024 4:20 PM-   [facility]IC[facility] [place] (Apr 10, 2024 12:30 PM   [facility] [place] Apr 13, 2024 3:30 PM  MRI Cardiac Morphology With and Without Contrast with [facility] Apr 25, 2024 9:30 AM Transportation Uses Benefits, [facility] Public and Family Friends  Chronic Disease Management support and education needs related to DMII and Pulmonary Disease   RNCM Clinical Goal(s):  Patient will work with the Care Management team over the next 30 days to address Transition of Care Barriers: Medication Management Support at home Provider appointments take all medications exactly as prescribed and will call provider for medication related questions as evidenced by no missed medication doses attend all scheduled medical appointments: as evidenced by no missed follow-up visits  through collaboration with RN Care manager, provider, and care team.   Interventions: Evaluation of current treatment plan related to  self management and patient's adherence to plan as established by provider  Transitions of Care:  Goal on track:  Yes. Doctor Visits  - discussed the importance of doctor visits [facility] RN/OT/PT  Post discharge activity  limitations prescribed by provider reviewed Reviewed Signs and symptoms of infection  Asthma: (Status:Goal on track:  Yes.) Short Term Goal Provided instruction about proper use of medications used for management of Asthma including inhalers Provided education about and advised patient to utilize infection prevention strategies to reduce risk of respiratory infection Discussed the importance of adequate rest and management of fatigue with Asthma Screening for signs and symptoms of depression related to chronic disease state  Assessed social determinant of health barriers   Patient Goals/Self-Care Activities: Participate in Transition of Care Program/Attend TOC scheduled calls Take all medications as prescribed Attend all scheduled provider appointments Call pharmacy for medication refills 3-7 days in advance of running out of medications Perform all self care activities independently  Perform IADL's (shopping, preparing meals, housekeeping, managing finances) independently Call provider office for new concerns or questions   Follow Up Plan:  Telephone follow up appointment with care management team member scheduled for:  03/28/24 @ 2:30pm for 1 follow-up call  The patient has been provided with contact information for the care management team and has been advised to call with any health related questions or concerns.          Medication review  Reviewed current home medications -- provided education as needed. Patient is aware of potential side effects and was encouraged to notify PCP for any adverse side effects or unwanted symptoms not relieved with interventions  Patient will call 911 for Medical Emergencies or Life -Threatening Symptoms.  Reviewed goals for care Patient/ Caregiver verbalizes understanding of instructions with the plan of care . The  Patient / Caregiver  was encouraged to make informed decisions about care, actively participate in managing health conditions, and  implement lifestyle changes as needed to promote independence and self-management of healthcare. SDOH screenings have been completed and addressed if indicted.  There are no reported barriers to care.    Follow-up Plan [facility] Case Management Nurse will provide follow-up and on-going assessment ,evaluation and education of disease processes, recommended interventions for both chronic and acute medical conditions ,  along with ongoing review of symptoms ,medication reviews / reconciliation during each weekly call . Any updates , inconsistencies, discrepancies or acute care concerns will be addressed and routed to the correct Practitioner if indicated   The patient has been provided with contact information for the care management team and has been advised to call with any health-related questions or concerns. Follow up call  with Care Team  as scheduled,or sooner should any new problems arise.  [facility]  Please call the care guide team at [phone]  if you need to cancel or reschedule your appointment . For scheduled calls -Three attempts will be made to reach you -if the scheduled call is missed or  we are unable to reach the you after 3 attempts no additional outreach attempts will be made and the TOC follow-up will be closed .   If you need to speak to a Nurse you may  call me directly at the number below or if I am unavailable,and  your need is urgent  please call the main [facility] number at [phone] and ask to speak with one of the [facility] ( Transition of Care )  Nurses  .  Patient was encouraged to Contact PCP with any changes in baseline or  medication regimen,  changes in health status  /  well-being, safety concerns, including falls any questions or concerns regarding ongoing medical care, any difficulty obtaining or picking up prescriptions, any changes or worsening in condition- including  symptoms not relieved  with interventions                                                                             Additionally, If you experience worsening of your symptoms, develop shortness of breath, If you are experiencing a medical emergency,  develop suicidal or homicidal thoughts you must seek medical attention immediately by calling 911 or report to your local emergency department or urgent care.   If you have a non-emergency medical problem during routine business hours, please contact your provider's office and ask to speak with a nurse.       Please take the time to read instructions/literature along with the possible adverse reactions/side effects for all the Medicines that have been prescribed to you. Only take newly prescribed  Medications after you have completely understood and accept all the possible adverse reactions/side effects.   Do not take more than prescribed Medications for  Pain, Sleep and Anxiety. Do not drive when taking Pain medications or sleep aid/ insomnia  medications It is not advisable to combine anxiety, sleep and pain medications without talking with your primary care practitioner    If you are experiencing a Mental Health or Behavioral Health Crisis or need someone to talk to Please  call the Suicide and Crisis Lifeline: 988 You may also call the [place] National Suicide Prevention Lifeline: 303-554-3196 or TTY: (567)700-9124 TTY (253)525-1948) to talk to a trained counselor.  You may call the Behavioral Health Crisis Line at (405)463-8774, at any time, 24 hours a day, 7 days a week- however If you are in danger or need immediate medical attention, call 911.   If you would like help to quit smoking, call 1-800-QUIT-NOW ( (973) 831-6941) OR Espaol: 1-855-Djelo-Ya (7-253-664-4034) o para ms informacin haga clic aqu or Text READY to 742-595 to register via text.   Susa Loffler , BSN, RN [employer]   [facility]Population Health RN Care Manager Direct Dial [phone]  Website: [email or web address]

## 2024-03-21 NOTE — Patient Outreach (Signed)
 Care Management  Transitions of Care Program Transitions of Care Post-discharge week 2   03/21/2024 Name: Brandy Johnston MRN: [id number] DOB: 18-May-1961  Subjective: Brandy Johnston is a 63 y.o. year old female who is a primary care patient of Alba Cory, MD. The Care Management team Engaged with patient Engaged with patient by telephone to assess and address transitions of care needs.   Consent to Services:  Patient was given information about care management services, agreed to services, and gave verbal consent to participate.   Assessment:   Patient/ Caregiver  voices no new complaints or concerns  and has not developed/ reported any new medical issues / Dx or acute changes. - since last follow-up call for most recent  Hospital stay  3/13-3/17 / 2025  No reported or reviewed Hospital Readmissions / No Urgent Care Visits / Transfers for Acute Change in condition .   MD/Specialist or Consultant visits reported since last follow-up call PCP visit 3/21 and today  . She has been approved for a [employer] device .( Per Lauren @ [facility])  She has recently changed Pharmacies and is using [facility] mail order for long term medications and [facility] in [place] for her local pharmacy  She is familiar and is comfortable using the [place].She is aware of parameters  She did have a referral for [facility] 3/21 Per chart review Tashia Leiterman was referred for the following: Food assistance, Transportation needs, Physicist, medical needs, Veterinary surgeon. Reports from Patient: The patient reports she do not need any resources for food, transportation, and benefit navigation. Patient stated her financial situation is somewhat hard and would like some financial resources. Patient stated she communications and gathers with family more than 3 times a week. She exercises 4 days a week for 10 minutes. No other needs identified and patient stated, CC may close her out. Just send financial resources. When I  spoke with the patient she confirmed she does not need resources at this time     Patient was Alert & O x 4 ,responsive, able to voice needs, cooperative with visit   There were no noted or reported cognitive/ mental status changes during this assessment General status reviewed  patient presents as expected for age and current situation with  finding as noted in previous sections of assessment  Patient denies changes in ADL function or IADLs  at this time.   Patient mood, expressions, emotional tone, insight, judgement  and thought content as expected with no notable or reported change No reports of new onset or increased / uncontrolled pain, has chronic pain managed with medicinal and non-medicinal interventions.  No changes in skin, no rashes, wounds or open areas - she does still report Oral thrush is persisting She just requested a refill for Nystatin. She may benefit from an oral antifungal. Diet reviewed and education provided as needed to promote good lifestyle choices and healthy meals. Such- as eating plenty of whole grains, nuts, vegetables, and fruits, lean meats, poultry, fish, beans, eggs, and nuts sizes (Unless otherwise restricted) Limit saturated and trans fats, sodium, and added sugars. Make sure to drink plenty of fluids every day. Be conscious of  portion sizes  She follows Heart healthy Carb modified diet Per PCP recommendations  Avoid sweets, any sweet beverages, pasta, rice, potatoes, bread Try to have eggs for breakfast, by Glucerna or Premier shakes Drink water or unsweetened drinks Avoid cereal for breakfast     Therapy Services  SN  / PT  /  OT  She does have [facility] coming and this includes a Engineer, civil (consulting) for [employer] , education and teaching   Medication reconciliation/ review completed based on most recent medication list in EHR; and in review of recent Provider follow-up appointments- confirmed patient obtained / is taking all newly prescribed medications as  instructed and is aware of any changes to previous medications regimen including dosage adjustments.  Reports  new changes in medication regimen or treatment plan during this review period.  Changes noted-Add [employer], restart Nystatin oral suspension  Medications and current treatments are semi-effective for use intended. No adverse Drug reactions  reported    Patient self-manages  medications Patient is able to take medications without difficulty;  denies questions/ concerns and reports no barriers to medication adherence at this time  Patient / Caregiver educated on red flag s/s to watch for based on current discharge diagnosis and was encouraged to report, any changes in baseline or  medication regimen,   or any new unmanaged side effects or symptoms not relieved with interventions  to PCP and / or the  [facility] Case Management team .            SDOH Interventions    Flowsheet Row Telephone from 02/22/2024 in [place] [facility] Office Visit from 12/09/2022 in [facility] Office Visit from 01/15/2021 in [facility]  SDOH Interventions     Food Insecurity Interventions Intervention Not Indicated Intervention Not Indicated --  Housing Interventions Intervention Not Indicated Intervention Not Indicated Intervention Not Indicated  Transportation Interventions Intervention Not Indicated, Patient Resources (Friends/Family), Payor Benefit Intervention Not Indicated Intervention Not Indicated  Utilities Interventions Intervention Not Indicated Intervention Not Indicated --  Alcohol Usage Interventions -- Intervention Not Indicated (Score <7) --  Financial Strain Interventions -- Intervention Not Indicated Other (Comment)   [doing better now that has disability]  Stress Interventions -- Intervention Not Indicated Intervention Not Indicated  Social Connections Interventions -- Intervention Not Indicated --        Goals  Addressed             This Visit's Progress    TOC Care Plan       Current Barriers:  Medication management multiple changes  Provider appointments PCP Pulmonology Cardiology-  Date of PCP follow-up appointment?: 03/17/24 Follow-up Provider: Alba Cory, MD Specialist Hospital Follow-up appointment confirmed?: Yes Date of Specialist follow-up appointment?: 04/07/24 Follow-Up Specialty Provider:: [facility] CARDIOLOGY [place] Apr 07, 2024 4:20 PM-   [facility] [place] (Apr 10, 2024 12:30 PM   [facility] [place] Apr 13, 2024 3:30 PM  MRI Cardiac Morphology With and Without Contrast with [facility] Apr 25, 2024 9:30 AM Transportation Uses Benefits, [facility] Public and Family Friends  Chronic Disease Management support and education needs related to DMII and Pulmonary Disease   RNCM Clinical Goal(s):  Patient will work with the Care Management team over the next 30 days to address Transition of Care Barriers: Medication Management Support at home Provider appointments take all medications exactly as prescribed and will call provider for medication related questions as evidenced by no missed medication doses attend all scheduled medical appointments: as evidenced by no missed follow-up visits  through collaboration with RN Care manager, provider, and care team.   Interventions: Evaluation of current treatment plan related to  self management and patient's adherence to plan as established by provider  Transitions of Care:  Goal on track:  Yes. Doctor Visits  - discussed the importance of doctor visits [facility] RN/OT/PT  Post discharge activity limitations prescribed by provider reviewed Reviewed Signs and symptoms of infection  Asthma: (Status:Goal on track:  Yes.) Short Term Goal Provided instruction about proper use of medications used for management of Asthma including inhalers Provided education about and advised patient to utilize  infection prevention strategies to reduce risk of respiratory infection Discussed the importance of adequate rest and management of fatigue with Asthma Screening for signs and symptoms of depression related to chronic disease state  Assessed social determinant of health barriers   Patient Goals/Self-Care Activities: Participate in Transition of Care Program/Attend TOC scheduled calls Take all medications as prescribed Attend all scheduled provider appointments Call pharmacy for medication refills 3-7 days in advance of running out of medications Perform all self care activities independently  Perform IADL's (shopping, preparing meals, housekeeping, managing finances) independently Call provider office for new concerns or questions   Follow Up Plan:  Telephone follow up appointment with care management team member scheduled for:  03/28/24 @ 2:30pm for 1 follow-up call  The patient has been provided with contact information for the care management team and has been advised to call with any health related questions or concerns.          Plan:  Call PCP office notify them she wishes [facility] [place] -  called and spoke with Patsy Lager  She also may need oral medication for the thrush as it is not resolving.  Telephone follow up appointment with care management team member scheduled for: 03/28/24 @ 2:30pm to check that  [facility] device is delivered and to check her Oral Baird Kay is resolving   The patient has been provided with contact information for the care management team and has been advised to call with any health related questions or concerns.   Routine follow-up and on-going assessment evaluation and education of disease processes, recommended interventions for both chronic and acute medical conditions , will occur during each weekly visit along with ongoing review of symptoms ,medication reviews and reconciliation. Any updates , inconsistencies, discrepancies or acute care  concerns will be addressed and routed to the correct Practitioner if indicated   Based on current information and Insurance plan -Reviewed benefits available to patient, including details about eligibility options for care if any area of needs were identified.  Reviewed patients ability to access and / or navigating the benefits system..Amb Referral made if indicted , refer to orders section of note for details   Please refer to Care Plan for goals and interventions -Effectiveness of interventions, symptom management and outcomes will be evaluated  weekly during [facility] 30-day Program Outreach calls  . Any necessary  changes and updates to Care Plan will be completed episodically    Reviewed goals for care Patient verbalizes understanding of instructions and care plan provided. Patient was encouraged to make informed decisions about their care, actively participate in managing their health condition, and implement lifestyle changes as needed to promote independence and self-management of health care   Patient was encouraged to Contact PCP with any changes in baseline or  medication regimen,  changes in health status  /  well-being, safety concerns, including falls any questions or concerns regarding ongoing medical care, any difficulty obtaining or picking up prescriptions, any changes or worsening in condition- including  symptoms not relieved  with interventions    The patient has been provided with contact information for the care management  team and has been advised to call with any health-related questions or concerns. Follow up call  with Care Team  as scheduled,or sooner should any new problems arise.    Susa Loffler , BSN, RN [facility]   VBCI-Population Health RN Care Manager Direct Dial [phone]  Fax: [phone] Website: [email or web address]

## 2024-03-21 NOTE — Telephone Encounter (Signed)
 Chief Complaint: Thrush Symptoms: pain in mouth Frequency: ongoing since beginning steroids Pertinent Negatives: Patient denies n/a Disposition: [] ED /[] Urgent Care (no appt availability in office) / [] Appointment(In office/virtual)/ []   Virtual Care/ [] Home Care/ [] Refused Recommended Disposition /[]  Mobile Bus/ [x]  Follow-up with PCP Additional Notes: Patient called in requesting PCP send over add'l medication for treatment of Thrush in mouth, stating the Nystatin was not working. Patient was told by additional nurse that there is an oral pill that can be taken for treatment of thrush. She is also asking about the status of her glucose device that is supposed to be called in to her pharmacy. Patient states she would like both of these sent to the [facility] on her pharmacy profile. Please call patient back with any questions.   Copied from CRM [phone]. Topic: Clinical - Medical Advice >> Mar 21, 2024  4:13 PM Patsy Lager T wrote: Reason for CRM: Christy Case Mgr with [facility] called stated patient is still having issues with the thrush in her mouth and patient is on her 2nd round of medication. Please f/u with another option that will help Reason for Disposition  Weak immune system (e.g., HIV positive, cancer chemo, splenectomy, organ transplant, chronic steroids)    Patient has been already been being treated for Thrush  Answer Assessment - Initial Assessment Questions 1. SYMPTOM: "What's the main symptom you're concerned about?" (e.g., chapped lips, dry mouth, lump, sores)     Thrush, sore throat 2. ONSET: "When did the  Thrush  start?"     When patient started prednisone 3. PAIN: "Is there any pain?" If Yes, ask: "How bad is it?" (Scale: 1-10; mild, moderate, severe)   - MILD (1-3):  doesn't interfere with eating or normal activities   - MODERATE (4-7): interferes with eating some solids and normal activities   - SEVERE (8-10):  excruciating pain, interferes with most normal  activities   - SEVERE DYSPHAGIA: can't swallow liquids, drooling     Patient states it varies 4. CAUSE: "What do you think is causing the symptoms?"     Thursh 5. OTHER SYMPTOMS: "Do you have any other symptoms?" (e.g., fever, sore throat, toothache, swelling)     Sore throat  Protocols used: Mouth Symptoms-A-AH

## 2024-03-21 NOTE — Progress Notes (Signed)
 Name: Brandy Johnston   MRN: [id number]    DOB: 08/05/1961   Date:03/21/2024       Progress Note  Subjective  Chief Complaint  Chief Complaint  Patient presents with   Medication Assistance   HPI   Patient came in today with all her medication so we could do medication reconciliation. She is no longer on Fioricet since headaches have been well controlled with Bennie Pierini and Ubrelvy prn. She is off Exforge 5/160 and taking Cardizem CD 180 mg and also half dose of metoprolol XL 25 mg daily for hypertrophic cardiomyopathy and palpitation as recommended by cardiologist. She has been sleeping well on higher dose of gabapentin and therefore has not been taking Trazodone or melatonin. She is tapering off prednisone and as recommended stopped taking SMT TMP once prednisone daily dose was below 30. Dizziness resolved and is off meclizine  During recent hospital stay her liver enzymes were elevated and due to recurrent wheezing and sob she will see GI to rule out aspiration as the cause of recurrent respiratory problems. Also advised to repeat labs to see if liver enzymes are trending up since yesterday she felt nauseated and vomited on her way to pulmonologist   No fever or chills   Patient Active Problem List   Diagnosis Date Noted   Hyperglycemia 03/11/2024   Small vessel disease (HCC) 01/26/2024   Eosinophilic asthma 01/18/2024   Muscle spasms of both lower extremities 01/18/2024   Chronic left shoulder pain 11/09/2022   Hyperlipidemia 01/14/2021   Incomplete tear of left rotator cuff 07/03/2020   Angioedema due to angiotensin converting enzyme inhibitor (ACE-I) 01/29/2020   Chronic bilateral back pain 07/26/2017   Coronary artery calcification 05/17/2017   Heart palpitations 05/15/2017   History of acute gastritis    Leukocytosis 09/30/2016   Atherosclerosis of abdominal aorta (HCC) 08/18/2016   Type 2 diabetes mellitus with stage 3 chronic kidney disease, without long-term current use of  insulin (HCC) 12/16/2015   Diabetic neuropathy associated with type 2 diabetes mellitus (HCC) 09/18/2015   Insomnia 09/18/2015   Benign essential HTN 06/18/2015   Chronic kidney disease (CKD), stage III (moderate) (HCC) 06/18/2015   Diabetes mellitus with renal manifestation (HCC) 06/18/2015   Elevated CK 06/18/2015   Obesity (BMI 30.0-34.9) 06/18/2015   Degenerative arthritis of hip 06/18/2015   Sickle cell trait (HCC) 06/18/2015   Dyslipidemia 05/27/2010   Gastroesophageal reflux disease 05/25/2008    Past Surgical History:  Procedure Laterality Date   BREAST BIOPSY Right 12/28/2019   stereo [facility] stromal fibrosis   CHOLECYSTECTOMY     COLONOSCOPY     ESOPHAGOGASTRODUODENOSCOPY (EGD) WITH PROPOFOL N/A 10/05/2016   Procedure: ESOPHAGOGASTRODUODENOSCOPY (EGD) WITH PROPOFOL;  Surgeon: Midge Minium, MD;  Location: [facility];  Service: Endoscopy;  Laterality: N/A;   FRACTURE SURGERY Left    cast and pins    SHOULDER ARTHROSCOPY WITH ROTATOR CUFF REPAIR AND SUBACROMIAL DECOMPRESSION Left 12/07/2018   Procedure: left shoulder manipulation under anesthesia, left shoulder arthroscopic lysis of adhesions;  Surgeon: Lyndle Herrlich, MD;  Location: [facility];  Service: Orthopedics;  Laterality: Left;   SHOULDER CLOSED REDUCTION Left 12/07/2018   Procedure: CLOSED MANIPULATION SHOULDER;  Surgeon: Lyndle Herrlich, MD;  Location: [facility];  Service: Orthopedics;  Laterality: Left;   shoulder surgery  Left 06/10/2018   Dr. Derryl Harbor   TUBAL LIGATION      Family History  Problem Relation Age of Onset   Migraines Mother    Diabetes Mother  Cancer Mother        lung   Arthritis Brother    Breast cancer Maternal Aunt    Cancer Maternal Uncle        Lung and Colon   Cirrhosis Brother    Breast cancer Cousin     Social History   Tobacco Use   Smoking status: Never   Smokeless tobacco: Never  Substance Use Topics   Alcohol use: Yes    Alcohol/week: 0.0 standard drinks of alcohol     Comment: rare     Current Outpatient Medications:    albuterol (VENTOLIN HFA) 108 (90 Base) MCG/ACT inhaler, SMARTSIG:2 Puff(s) By Mouth Every 4-6 Hours PRN, Disp: , Rfl:    aspirin (ASPIRIN 81) 81 MG chewable tablet, Chew 1 tablet (81 mg total) by mouth daily., Disp: 30 tablet, Rfl: 0   Atogepant (QULIPTA) 30 MG TABS, Take 1 tablet (30 mg total) by mouth daily at 12 noon., Disp: 30 tablet, Rfl: 2   baclofen (LIORESAL) 10 MG tablet, Take 10 mg by mouth 3 (three) times daily., Disp: , Rfl:    benralizumab (FASENRA PEN) 30 MG/ML prefilled autoinjector, Inject into the skin., Disp: , Rfl:    Blood Glucose Monitoring Suppl (CONTOUR NEXT ONE) KIT, USE TO TEST BLOOD SUGAR ONCE D, Disp: , Rfl:    Budeson-Glycopyrrol-Formoterol (BREZTRI AEROSPHERE) 160-9-4.8 MCG/ACT AERO, Inhale 2 puffs into the lungs in the morning and at bedtime., Disp: , Rfl:    Cholecalciferol (VITAMIN D) 2000 units CAPS, Take 1 capsule (2,000 Units total) by mouth daily., Disp: 30 capsule, Rfl: 0   cyclobenzaprine (FLEXERIL) 5 MG tablet, Take 1 tablet (5 mg total) by mouth at bedtime., Disp: 30 tablet, Rfl: 0   diclofenac Sodium (VOLTAREN) 1 % GEL, Apply 2 g topically 4 (four) times daily., Disp: 100 g, Rfl: 2   diltiazem (CARDIZEM CD) 180 MG 24 hr capsule, Take 1 capsule (180 mg total) by mouth daily., Disp: 90 capsule, Rfl: 1   empagliflozin (JARDIANCE) 25 MG TABS tablet, Take 1 tablet (25 mg total) by mouth daily before breakfast., Disp: 90 tablet, Rfl: 0   EPINEPHrine 0.3 mg/0.3 mL IJ SOAJ injection, Inject into the muscle., Disp: , Rfl:    ezetimibe (ZETIA) 10 MG tablet, Take 1 tablet (10 mg total) by mouth daily., Disp: 90 tablet, Rfl: 3   fluticasone (FLONASE) 50 MCG/ACT nasal spray, 2 sprays each nostril Daily. DISP#  1 bottles = 1 month supply., Disp: , Rfl:    fluticasone furoate-vilanterol (BREO ELLIPTA) 100-25 MCG/ACT AEPB, Inhale 1 puff into the lungs daily., Disp: , Rfl:    gabapentin (NEURONTIN) 300 MG capsule,  TAKE 1 CAPSULE(300 MG) BY MOUTH THREE TIMES DAILY, Disp: 270 capsule, Rfl: 1   glucose blood test strip, Use as instructed, Disp: 100 each, Rfl: 12   insulin NPH Human (NOVOLIN N) 100 UNIT/ML injection, Inject into the skin., Disp: , Rfl:    ipratropium-albuterol (DUONEB) 0.5-2.5 (3) MG/3ML SOLN, Take 3 mLs by nebulization every 6 (six) hours as needed., Disp: 360 mL, Rfl: 1   loratadine (CLARITIN) 10 MG tablet, Take 1 tablet (10 mg total) by mouth daily., Disp: 90 tablet, Rfl: 1   magnesium oxide (MAG-OX) 400 MG tablet, Take 1 tablet (400 mg total) by mouth 2 (two) times daily., Disp: 180 tablet, Rfl: 1   metoprolol succinate (TOPROL-XL) 25 MG 24 hr tablet, Take 1 tablet (25 mg total) by mouth daily. (Patient taking differently: Take 25 mg by mouth daily. Takes 1/2  tablet), Disp: 90 tablet, Rfl: 0   Microlet Lancets MISC, USE TO CHECK BLOOD SUGAR ONCE D, Disp: , Rfl:    montelukast (SINGULAIR) 10 MG tablet, Take by mouth., Disp: , Rfl:    nystatin (MYCOSTATIN) 100000 UNIT/ML suspension, Take 5 mLs (500,000 Units total) by mouth 4 (four) times daily., Disp: 60 mL, Rfl: 1   ondansetron (ZOFRAN-ODT) 4 MG disintegrating tablet, Take 1 tablet (4 mg total) by mouth every 8 (eight) hours as needed for nausea., Disp: 30 tablet, Rfl: 0   pantoprazole (PROTONIX) 40 MG tablet, Take 1 tablet (40 mg total) by mouth 2 (two) times daily., Disp: 90 tablet, Rfl: 0   predniSONE (DELTASONE) 10 MG tablet, Take 10 mg by mouth daily with breakfast. 60 mg daily, plan for 7-days then taper by 10mg  every 7 days., Disp: , Rfl:    promethazine (PHENERGAN) 25 MG tablet, Take 1 tablet (25 mg total) by mouth every 8 (eight) hours as needed for nausea or vomiting. (Patient taking differently: Take 25 mg by mouth every 8 (eight) hours as needed for nausea or vomiting (PRN).), Disp: 20 tablet, Rfl: 0   rosuvastatin (CRESTOR) 40 MG tablet, TAKE 1 TABLET(40 MG) BY MOUTH DAILY, Disp: 90 tablet, Rfl: 3   Semaglutide,0.25 or 0.5MG /DOS,  (OZEMPIC, 0.25 OR 0.5 MG/DOSE,) 2 MG/3ML SOPN, Inject 0.5 mg into the skin once a week., Disp: 6 mL, Rfl: 0   sodium chloride 0.9 % nebulizer solution, Inhale into the lungs., Disp: , Rfl:    Spacer/Aero-Holding Chambers (OPTICHAMBER DIAMOND) MISC, , Disp: , Rfl:    Ubrogepant (UBRELVY) 100 MG TABS, Take 1 tablet (100 mg total) by mouth daily as needed., Disp: 16 tablet, Rfl: 0   Continuous Glucose Sensor (FREESTYLE LIBRE 3 SENSOR) MISC, 1 Device by Does not apply route every 14 (fourteen) days. (Patient not taking: Reported on 03/21/2024), Disp: 6 each, Rfl: 1  Allergies  Allergen Reactions   Ace Inhibitors Swelling    Angioedema    Lisinopril Swelling    Face and neck swelling   Quetiapine     confusion    I personally reviewed active problem list, medication list, allergies with the patient/caregiver today.   ROS  Ten systems reviewed and is negative except as mentioned in HPI    Objective  Vitals:   03/21/24 1127  BP: 110/70  Pulse: (!) 106  Resp: 16  SpO2: 96%  Weight: 160 lb 3.2 oz (72.7 kg)  Height: 5' (1.524 m)    Body mass index is 31.29 kg/m.  Physical Exam  Constitutional: Patient appears well-developed , cushingoid face.No distress.  HEENT: head atraumatic, normocephalic, pupils equal and reactive to light, neck supple Cardiovascular: Normal rate, regular rhythm and normal heart sounds.  No murmur heard. No BLE edema. Pulmonary/Chest: Effort normal and breath sounds normal. No respiratory distress. Abdominal: Soft.  There is no tenderness. Psychiatric: Patient has a normal mood and affect. behavior is normal. Judgment and thought content normal.   Recent Results (from the past 2160 hours)  POCT Glucose (CBG)     Status: Abnormal   Collection Time: 01/14/24  9:56 AM  Result Value Ref Range   POC Glucose 162 (A) 70 - 99 mg/dl  Lipid panel     Status: Abnormal   Collection Time: 01/18/24 12:15 PM  Result Value Ref Range   Cholesterol 179 <200 mg/dL    HDL 92 > OR = 50 mg/dL   Triglycerides 962 (H) <150 mg/dL    Comment: . If  a non-fasting specimen was collected, consider repeat triglyceride testing on a fasting specimen if clinically indicated.  Perry Mount et al. J. of Clin. Lipidol. 2015;9:129-169. Marland Kitchen    LDL Cholesterol (Calc) 60 mg/dL (calc)    Comment: Reference range: <100 . Desirable range <100 mg/dL for primary prevention;   <70 mg/dL for patients with CHD or diabetic patients  with > or = 2 CHD risk factors. Marland Kitchen LDL-C is now calculated using the Martin-Hopkins  calculation, which is a validated novel method providing  better accuracy than the Friedewald equation in the  estimation of LDL-C.  Horald Pollen et al. Lenox Ahr. 9562;130(86): 2061-2068  (http://education.[facility].com/faq/FAQ164)    Total CHOL/HDL Ratio 1.9 <5.0 (calc)   Non-HDL Cholesterol (Calc) 87 <578 mg/dL (calc)    Comment: For patients with diabetes plus 1 major ASCVD risk  factor, treating to a non-HDL-C goal of <100 mg/dL  (LDL-C of <46 mg/dL) is considered a therapeutic  option.   Microalbumin / creatinine urine ratio     Status: Abnormal   Collection Time: 01/18/24 12:15 PM  Result Value Ref Range   Creatinine, Urine 135 20 - 275 mg/dL   Microalb, Ur 9.6 mg/dL    Comment: Reference Range Not established    Microalb Creat Ratio 71 (H) <30 mg/g creat    Comment: . The ADA defines abnormalities in albumin excretion as follows: Marland Kitchen Albuminuria Category        Result (mg/g creatinine) . Normal to Mildly increased   <30 Moderately increased         30-299  Severely increased           > OR = 300 . The ADA recommends that at least two of three specimens collected within a 3-6 month period be abnormal before considering a patient to be within a diagnostic category.   CBC with Differential/Platelet     Status: Abnormal   Collection Time: 01/18/24 12:15 PM  Result Value Ref Range   WBC 10.7 3.8 - 10.8 Thousand/uL   RBC 4.96 3.80 - 5.10 Million/uL    Hemoglobin 13.7 11.7 - 15.5 g/dL   HCT 96.2 95.2 - 84.1 %   MCV 84.5 80.0 - 100.0 fL   MCH 27.6 27.0 - 33.0 pg   MCHC 32.7 32.0 - 36.0 g/dL    Comment: For adults, a slight decrease in the calculated MCHC value (in the range of 30 to 32 g/dL) is most likely not clinically significant; however, it should be interpreted with caution in correlation with other red cell parameters and the patient's clinical condition.    RDW 15.0 11.0 - 15.0 %   Platelets 138 (L) 140 - 400 Thousand/uL   MPV 11.3 7.5 - 12.5 fL   Neutro Abs 7,404 1,500 - 7,800 cells/uL   Absolute Lymphocytes 2,279 850 - 3,900 cells/uL   Absolute Monocytes 610 200 - 950 cells/uL   Eosinophils Absolute 342 15 - 500 cells/uL   Basophils Absolute 64 0 - 200 cells/uL   Neutrophils Relative % 69.2 %   Total Lymphocyte 21.3 %   Monocytes Relative 5.7 %   Eosinophils Relative 3.2 %   Basophils Relative 0.6 %   Smear Review      Comment: Review of peripheral smear confirms automated results.   COMPLETE METABOLIC PANEL WITH GFR     Status: Abnormal   Collection Time: 01/18/24 12:15 PM  Result Value Ref Range   Glucose, Bld 189 (H) 65 - 99 mg/dL    Comment: .  Fasting reference interval . For someone without known diabetes, a glucose value >125 mg/dL indicates that they may have diabetes and this should be confirmed with a follow-up test. .    BUN 15 7 - 25 mg/dL   Creat 1.61 (H) 0.96 - 1.05 mg/dL   eGFR 41 (L) > OR = 60 mL/min/1.26m2   BUN/Creatinine Ratio 10 6 - 22 (calc)   Sodium 139 135 - 146 mmol/L   Potassium 3.7 3.5 - 5.3 mmol/L   Chloride 103 98 - 110 mmol/L   CO2 26 20 - 32 mmol/L   Calcium 9.2 8.6 - 10.4 mg/dL   Total Protein 6.9 6.1 - 8.1 g/dL   Albumin 4.1 3.6 - 5.1 g/dL   Globulin 2.8 1.9 - 3.7 g/dL (calc)   AG Ratio 1.5 1.0 - 2.5 (calc)   Total Bilirubin 0.5 0.2 - 1.2 mg/dL   Alkaline phosphatase (APISO) 121 37 - 153 U/L   AST 27 10 - 35 U/L   ALT 42 (H) 6 - 29 U/L  CK (Creatine  Kinase)     Status: None   Collection Time: 01/18/24 12:15 PM  Result Value Ref Range   Total CK 127 20 - 243 U/L  BASIC METABOLIC PANEL WITH GFR     Status: Abnormal   Collection Time: 02/02/24  8:08 AM  Result Value Ref Range   Glucose, Bld 87 65 - 99 mg/dL    Comment: .            Fasting reference interval .    BUN 5 (L) 7 - 25 mg/dL    Comment: Verified by repeat analysis. .    Creat 1.39 (H) 0.50 - 1.05 mg/dL    Comment: Verified by repeat analysis. Marland Kitchen    eGFR 42 (L) > OR = 60 mL/min/1.77m2   BUN/Creatinine Ratio 4 (L) 6 - 22 (calc)   Sodium 143 135 - 146 mmol/L   Potassium 3.8 3.5 - 5.3 mmol/L   Chloride 106 98 - 110 mmol/L   CO2 28 20 - 32 mmol/L   Calcium 9.3 8.6 - 10.4 mg/dL  COMPLETE METABOLIC PANEL WITH GFR     Status: Abnormal   Collection Time: 03/17/24  3:07 PM  Result Value Ref Range   Glucose, Bld 220 (H) 65 - 99 mg/dL    Comment: .            Fasting reference interval . For someone without known diabetes, a glucose value >125 mg/dL indicates that they may have diabetes and this should be confirmed with a follow-up test. .    BUN 18 7 - 25 mg/dL   Creat 0.45 (H) 4.09 - 1.05 mg/dL   BUN/Creatinine Ratio 10 6 - 22 (calc)   Sodium 137 135 - 146 mmol/L   Potassium 4.5 3.5 - 5.3 mmol/L   Chloride 101 98 - 110 mmol/L   CO2 25 20 - 32 mmol/L   Calcium 9.6 8.6 - 10.4 mg/dL   Total Protein 7.3 6.1 - 8.1 g/dL   Albumin 4.4 3.6 - 5.1 g/dL   Globulin 2.9 1.9 - 3.7 g/dL (calc)   AG Ratio 1.5 1.0 - 2.5 (calc)   Total Bilirubin 0.9 0.2 - 1.2 mg/dL   Alkaline phosphatase (APISO) 177 (H) 37 - 153 U/L   AST 49 (H) 10 - 35 U/L   ALT 69 (H) 6 - 29 U/L    Diabetic Foot Exam:     PHQ2/9:    03/17/2024  2:03 PM 02/04/2024   10:09 AM 01/18/2024   11:27 AM 12/09/2023   11:21 AM 11/18/2023    9:23 AM  Depression screen PHQ 2/9  Decreased Interest 0 0 0 0 0  Down, Depressed, Hopeless 0 0 0 0 0  PHQ - 2 Score 0 0 0 0 0  Altered sleeping 0 0 0  0  Tired,  decreased energy 0 0 0  0  Change in appetite 0 0 0  0  Feeling bad or failure about yourself  0 0 0  0  Trouble concentrating 0 0 0  0  Moving slowly or fidgety/restless 0 0 0  0  Suicidal thoughts 0 0 0  0  PHQ-9 Score 0 0 0  0  Difficult doing work/chores Not difficult at all Not difficult at all Not difficult at all      phq 9 is negative  Fall Risk:    01/18/2024   11:17 AM 11/18/2023    9:23 AM 09/15/2023    9:26 AM 09/09/2023    9:37 AM 09/08/2023    9:50 AM  Fall Risk   Falls in the past year? 0 0 0 0 0  Number falls in past yr: 0  0 0 0  Injury with Fall? 0  0 0 0  Risk for fall due to : No Fall Risks No Fall Risks No Fall Risks No Fall Risks No Fall Risks  Follow up Falls prevention discussed;Education provided;Falls evaluation completed Falls prevention discussed Falls prevention discussed;Education provided;Falls evaluation completed Falls prevention discussed;Education provided;Falls evaluation completed Falls prevention discussed     Assessment & Plan  1. Elevated liver enzymes (Primary)  - Hepatitis, Acute - COMPLETE METABOLIC PANEL WITH GFR - CBC with Differential/Platelet  2. Nausea and vomiting, unspecified vomiting type  - Hepatitis, Acute - COMPLETE METABOLIC PANEL WITH GFR - CBC with Differential/Platelet  3. Medication course changed  Reviewed all medication and reconciliation done with patient   4. Hypertrophic cardiomyopathy (HCC)  Continue current regiment

## 2024-03-21 NOTE — Telephone Encounter (Signed)
 Left detailed vm need at mail order or local pharmacy?

## 2024-03-21 NOTE — Telephone Encounter (Signed)
 Copied from CRM [id number]. Topic: Clinical - Prescription Issue >> Mar 21, 2024  3:55 PM Victorino Dike T wrote: Reason for CRM: Send the [employer] device to [facility] in [place]- please cal patient [phone]- also need more of nystatin (MYCOSTATIN) 100000 UNIT/ML suspension- ran out and still need it

## 2024-03-22 ENCOUNTER — Telehealth: Payer: Self-pay

## 2024-03-22 ENCOUNTER — Other Ambulatory Visit: Payer: Self-pay | Admitting: Family Medicine

## 2024-03-22 ENCOUNTER — Other Ambulatory Visit: Payer: Self-pay

## 2024-03-22 DIAGNOSIS — J45909 Unspecified asthma, uncomplicated: Secondary | ICD-10-CM

## 2024-03-22 DIAGNOSIS — I700 Atherosclerosis of aorta: Secondary | ICD-10-CM

## 2024-03-22 DIAGNOSIS — E1129 Type 2 diabetes mellitus with other diabetic kidney complication: Secondary | ICD-10-CM

## 2024-03-22 DIAGNOSIS — R790 Abnormal level of blood mineral: Secondary | ICD-10-CM

## 2024-03-22 DIAGNOSIS — G8929 Other chronic pain: Secondary | ICD-10-CM

## 2024-03-22 DIAGNOSIS — N1831 Chronic kidney disease, stage 3a: Secondary | ICD-10-CM

## 2024-03-22 DIAGNOSIS — G43011 Migraine without aura, intractable, with status migrainosus: Secondary | ICD-10-CM

## 2024-03-22 DIAGNOSIS — E1142 Type 2 diabetes mellitus with diabetic polyneuropathy: Secondary | ICD-10-CM

## 2024-03-22 DIAGNOSIS — I10 Essential (primary) hypertension: Secondary | ICD-10-CM

## 2024-03-22 MED ORDER — LORATADINE 10 MG PO TABS: 10.0000 mg | ORAL_TABLET | Freq: Every day | ORAL | 0 refills | Status: DC

## 2024-03-22 MED ORDER — UBRELVY 100 MG PO TABS: 1.0000 | ORAL_TABLET | Freq: Every day | ORAL | 0 refills | Status: AC | PRN

## 2024-03-22 MED ORDER — EMPAGLIFLOZIN 25 MG PO TABS: 25.0000 mg | ORAL_TABLET | Freq: Every day | ORAL | 0 refills | Status: DC

## 2024-03-22 MED ORDER — QULIPTA 30 MG PO TABS: 1.0000 | ORAL_TABLET | Freq: Every day | ORAL | 0 refills | Status: DC

## 2024-03-22 MED ORDER — PANTOPRAZOLE SODIUM 40 MG PO TBEC: 40.0000 mg | DELAYED_RELEASE_TABLET | Freq: Two times a day (BID) | ORAL | 0 refills | Status: DC

## 2024-03-22 MED ORDER — DILTIAZEM HCL ER COATED BEADS 180 MG PO CP24: 180.0000 mg | ORAL_CAPSULE | Freq: Every day | ORAL | 0 refills | Status: DC

## 2024-03-22 MED ORDER — EZETIMIBE 10 MG PO TABS: 10.0000 mg | ORAL_TABLET | Freq: Every day | ORAL | 0 refills | Status: DC

## 2024-03-22 MED ORDER — FREESTYLE LIBRE 3 READER DEVI: 1.0000 | 0 refills | Status: DC

## 2024-03-22 MED ORDER — MAGNESIUM OXIDE 400 MG PO TABS
400.0000 mg | ORAL_TABLET | Freq: Two times a day (BID) | ORAL | 0 refills | Status: DC
Start: 2024-03-22 — End: 2024-06-22

## 2024-03-22 MED ORDER — NYSTATIN 100000 UNIT/ML MT SUSP: 5.0000 mL | Freq: Four times a day (QID) | OROMUCOSAL | 1 refills | Status: DC

## 2024-03-22 MED ORDER — FREESTYLE LIBRE 3 SENSOR MISC: 1.0000 | 1 refills | Status: DC

## 2024-03-22 MED ORDER — CYCLOBENZAPRINE HCL 5 MG PO TABS: 5.0000 mg | ORAL_TABLET | Freq: Every day | ORAL | 0 refills | Status: DC

## 2024-03-22 MED ORDER — METOPROLOL SUCCINATE ER 25 MG PO TB24: 12.5000 mg | ORAL_TABLET | Freq: Every day | ORAL | 0 refills | Status: DC

## 2024-03-22 MED ORDER — GABAPENTIN 300 MG PO CAPS: 300.0000 mg | ORAL_CAPSULE | Freq: Three times a day (TID) | ORAL | 0 refills | Status: DC

## 2024-03-22 MED ORDER — ROSUVASTATIN CALCIUM 40 MG PO TABS: 40.0000 mg | ORAL_TABLET | Freq: Every day | ORAL | 0 refills | Status: DC

## 2024-03-22 NOTE — Addendum Note (Signed)
 Addended by: Forde Radon on: 03/22/2024 10:16 AM   Modules accepted: Orders

## 2024-03-22 NOTE — Telephone Encounter (Signed)
 Pt requested [facility]

## 2024-03-22 NOTE — Telephone Encounter (Signed)
 Tee'd up for 90 day supply only except for Flexeril since muscle spasms medication please edit that prescription.

## 2024-03-22 NOTE — Telephone Encounter (Signed)
 New pharmacy [facility]s [facility]

## 2024-03-22 NOTE — Telephone Encounter (Signed)
 Patient will contact [facility] to re send rx requests.

## 2024-03-22 NOTE — Telephone Encounter (Signed)
 Copied from CRM [id number]. Topic: Clinical - Prescription Issue >> Mar 22, 2024  8:38 AM Truddie Crumble wrote: Reason for CRM: patient called stating the provider blocked all her medications from [facility]. Patient did not know if it was a mistake or not and the provider knew she was going to [facility] CB [phone]

## 2024-03-23 ENCOUNTER — Other Ambulatory Visit: Payer: Self-pay | Admitting: Family Medicine

## 2024-03-23 ENCOUNTER — Encounter: Payer: Self-pay | Admitting: Family Medicine

## 2024-03-23 DIAGNOSIS — E1142 Type 2 diabetes mellitus with diabetic polyneuropathy: Secondary | ICD-10-CM

## 2024-03-23 DIAGNOSIS — N183 Chronic kidney disease, stage 3 unspecified: Secondary | ICD-10-CM | POA: Diagnosis not present

## 2024-03-23 DIAGNOSIS — I10 Essential (primary) hypertension: Secondary | ICD-10-CM

## 2024-03-23 DIAGNOSIS — I129 Hypertensive chronic kidney disease with stage 1 through stage 4 chronic kidney disease, or unspecified chronic kidney disease: Secondary | ICD-10-CM | POA: Diagnosis not present

## 2024-03-23 DIAGNOSIS — J8283 Eosinophilic asthma: Secondary | ICD-10-CM | POA: Diagnosis not present

## 2024-03-23 DIAGNOSIS — J189 Pneumonia, unspecified organism: Secondary | ICD-10-CM | POA: Diagnosis not present

## 2024-03-23 DIAGNOSIS — J45901 Unspecified asthma with (acute) exacerbation: Secondary | ICD-10-CM | POA: Diagnosis not present

## 2024-03-23 DIAGNOSIS — R112 Nausea with vomiting, unspecified: Secondary | ICD-10-CM

## 2024-03-23 DIAGNOSIS — I421 Obstructive hypertrophic cardiomyopathy: Secondary | ICD-10-CM | POA: Diagnosis not present

## 2024-03-23 DIAGNOSIS — E1122 Type 2 diabetes mellitus with diabetic chronic kidney disease: Secondary | ICD-10-CM | POA: Diagnosis not present

## 2024-03-23 DIAGNOSIS — R1310 Dysphagia, unspecified: Secondary | ICD-10-CM | POA: Diagnosis not present

## 2024-03-23 LAB — HEPATITIS PANEL, ACUTE
Hep A IgM: NONREACTIVE
Hep B C IgM: NONREACTIVE
Hepatitis B Surface Ag: NONREACTIVE
Hepatitis C Ab: NONREACTIVE

## 2024-03-23 LAB — COMPLETE METABOLIC PANEL WITHOUT GFR
AG Ratio: 1.7 (calc) (ref 1.0–2.5)
ALT: 63 U/L — ABNORMAL HIGH (ref 6–29)
AST: 47 U/L — ABNORMAL HIGH (ref 10–35)
Albumin: 3.9 g/dL (ref 3.6–5.1)
Alkaline phosphatase (APISO): 212 U/L — ABNORMAL HIGH (ref 37–153)
BUN/Creatinine Ratio: 8 (calc) (ref 6–22)
BUN: 14 mg/dL (ref 7–25)
CO2: 29 mmol/L (ref 20–32)
Calcium: 8.9 mg/dL (ref 8.6–10.4)
Chloride: 99 mmol/L (ref 98–110)
Creat: 1.74 mg/dL — ABNORMAL HIGH (ref 0.50–1.05)
Globulin: 2.3 g/dL (ref 1.9–3.7)
Glucose, Bld: 228 mg/dL — ABNORMAL HIGH (ref 65–99)
Potassium: 4 mmol/L (ref 3.5–5.3)
Sodium: 135 mmol/L (ref 135–146)
Total Bilirubin: 0.7 mg/dL (ref 0.2–1.2)
Total Protein: 6.2 g/dL (ref 6.1–8.1)

## 2024-03-23 LAB — CBC WITH DIFFERENTIAL/PLATELET
Absolute Lymphocytes: 1735 {cells}/uL (ref 850–3900)
Absolute Monocytes: 510 {cells}/uL (ref 200–950)
Basophils Absolute: 29 {cells}/uL (ref 0–200)
Basophils Relative: 0.3 %
Eosinophils Absolute: 0 {cells}/uL — ABNORMAL LOW (ref 15–500)
Eosinophils Relative: 0 %
HCT: 37.7 % (ref 35.0–45.0)
Hemoglobin: 12.7 g/dL (ref 11.7–15.5)
MCH: 28.2 pg (ref 27.0–33.0)
MCHC: 33.7 g/dL (ref 32.0–36.0)
MCV: 83.8 fL (ref 80.0–100.0)
MPV: 11.2 fL (ref 7.5–12.5)
Monocytes Relative: 5.2 %
Neutro Abs: 7526 {cells}/uL (ref 1500–7800)
Neutrophils Relative %: 76.8 %
Platelets: 119 10*3/uL — ABNORMAL LOW (ref 140–400)
RBC: 4.5 10*6/uL (ref 3.80–5.10)
RDW: 15.2 % — ABNORMAL HIGH (ref 11.0–15.0)
Total Lymphocyte: 17.7 %
WBC: 9.8 10*3/uL (ref 3.8–10.8)

## 2024-03-23 NOTE — Telephone Encounter (Signed)
 Copied from CRM [id number]. Topic: Clinical - Medication Refill >> Mar 23, 2024  3:20 PM Turkey B wrote: Most Recent Primary Care Visit:  Provider: Alba Cory  Department: [facility]  Visit Type: OFFICE VISIT  Date: 03/21/2024  Medication:ditiazem /aspirin 80mg  tab/  meclzin 25 ozempic 0.5 vc /promethshzine 6.25mg / trazadone 50mg / vit d 25/ montelukast (SINGULAIR) 10 MG tablet  10 , /ondansetron, 4mg /butalbital 160/9 4.8/ /cyclobenzaprine (FLEXERIL) 5 MG tablet/Semaglutide,0.25 or 0.5MG /DOS, (OZEMPIC, 0.25 OR 0.5 MG/DOSE,) 2 MG/3ML SOPN   Has the patient contacted their pharmacy? yes (Agent: If yes, when and what did the pharmacy advise?)pharmacy states will also refax med request, no med request showing in system for these meds  Is this the correct pharmacy for this prescription? yes This is the patient's preferred pharmacy:  [facility], [place] - [address] [place] [address] Phone: [phone] Fax: [phone]   Has the prescription been filled recently? no  Is the patient out of the medication? yes Has the patient been seen for an appointment in the last year OR does the patient have an upcoming appointment? yes  Can we respond through MyChart? yes  Agent: Please be advised that Rx refills may take up to 3 business days. We ask that you follow-up with your pharmacy.

## 2024-03-24 MED ORDER — OZEMPIC (0.25 OR 0.5 MG/DOSE) 2 MG/3ML ~~LOC~~ SOPN: 0.5000 mg | PEN_INJECTOR | SUBCUTANEOUS | 0 refills | Status: DC

## 2024-03-24 NOTE — Telephone Encounter (Signed)
 Requested Prescriptions  Pending Prescriptions Disp Refills   budeson-glycopyrrolate-formoterol (BREZTRI AEROSPHERE) 160-9-4.8 MCG/ACT AERO      Sig: Inhale 2 puffs into the lungs in the morning and at bedtime.     Off-Protocol Failed - 03/24/2024  4:09 PM      Failed - Medication not assigned to a protocol, review manually.      Passed - Valid encounter within last 12 months    Recent Outpatient Visits           3 days ago Elevated liver enzymes   [facility] Al[facility] Cory, MD   1 week ago Hospital discharge follow-up   [facility], MD   4 weeks ago Eosinophilic asthma   [facility], MD   1 month ago Chronic maxillary sinusitis   [facility], MD   1 month ago Small vessel disease [facility])   [facility] Alba Cory, MD       Future Appointments             In 1 month Alba Cory, MD [facility]             cyclobenzaprine (FLEXERIL) 5 MG tablet 90 tablet 0    Sig: Take 1 tablet (5 mg total) by mouth at bedtime.     Not Delegated - Analgesics:  Muscle Relaxants Failed - 03/24/2024  4:09 PM      Failed - This refill cannot be delegated      Passed - Valid encounter within last 6 months    Recent Outpatient Visits           3 days ago Elevated liver enzymes   [facility], MD   1 week ago Hospital discharge follow-up   [facility], [facility]   4 weeks ago Eosinophilic asthma   [facility] Al[facility], MD   1 month ago Chronic maxillary sinusitis   [facility] Al[facility] Cory, MD   1 month ago Small vessel disease [facility])   [facility] Al[facility] Cory, MD       Future Appointments              In 1 month [facility] Cory, MD [facility]             diltiazem (CARDIZEM CD) 180 MG 24 hr capsule 90 capsule 0    Sig: Take 1 capsule (180 mg total) by mouth daily.     Cardiovascular: Calcium Channel Blockers 3 Failed - 03/24/2024  4:09 PM      Failed - ALT in normal range and within 360 days    ALT  Date Value Ref Range Status  03/22/2024 63 (H) 6 - 29 U/L Final   SGPT (ALT)  Date Value Ref Range Status  07/07/2012 29 U/L Final    Comment:    12-78 NOTE: NEW REFERENCE RANGE 11/20/2011          Failed - AST in normal range and within 360 days    AST  Date Value Ref Range Status  03/22/2024 47 (H) 10 - 35 U/L Final   SGOT(AST)  Date Value Ref Range Status  07/07/2012 31 15 - 37 Unit/L Final         Failed - Cr  in normal range and within 360 days    Creat  Date Value Ref Range Status  03/22/2024 1.74 (H) 0.50 - 1.05 mg/dL Final   Creatinine, Urine  Date Value Ref Range Status  01/18/2024 135 20 - 275 mg/dL Final         Passed - Last BP in normal range    BP Readings from Last 1 Encounters:  03/21/24 110/70         Passed - Last Heart Rate in normal range    Pulse Readings from Last 1 Encounters:  03/21/24 (!) 106         Passed - Valid encounter within last 6 months    Recent Outpatient Visits           3 days ago Elevated liver enzymes   [facility], [facility]   1 week ago Hospital discharge follow-up   [facility], [facility]   4 weeks ago Eosinophilic asthma   [facility] Cory, MD   1 month ago Chronic maxillary sinusitis   [facility], Danna Hefty, MD   1 month ago Small vessel disease [facility])   [facility] Alba Cory, MD       Future Appointments             In 1 month [facility] Cory, MD [facility],  [facility]             ondansetron (ZOFRAN-ODT) 4 MG disintegrating tablet 30 tablet 0    Sig: Take 1 tablet (4 mg total) by mouth every 8 (eight) hours as needed for nausea.     Not Delegated - Gastroenterology: Antiemetics - ondansetron Failed - 03/24/2024  4:09 PM      Failed - This refill cannot be delegated      Failed - AST in normal range and within 360 days    AST  Date Value Ref Range Status  03/22/2024 47 (H) 10 - 35 U/L Final   SGOT(AST)  Date Value Ref Range Status  07/07/2012 31 15 - 37 Unit/L Final         Failed - ALT in normal range and within 360 days    ALT  Date Value Ref Range Status  03/22/2024 63 (H) 6 - 29 U/L Final   SGPT (ALT)  Date Value Ref Range Status  07/07/2012 29 U/L Final    Comment:    12-78 NOTE: NEW REFERENCE RANGE 11/20/2011          Passed - Valid encounter within last 6 months    Recent Outpatient Visits           3 days ago Elevated liver enzymes   [facility]   1 week ago Hospital discharge follow-up   [facility], [facility]   4 weeks ago Eosinophilic asthma   [facility], MD   1 month ago Chronic maxillary sinusitis   [facility] Al[facility], MD   1 month ago Small vessel disease [facility])   [facility] Alba Cory, MD       Future Appointments             In 1 month Carlynn Purl, Danna Hefty, MD [facility], [facility]  Semaglutide,0.25 or 0.5MG /DOS, (OZEMPIC, 0.25 OR 0.5 MG/DOSE,) 2 MG/3ML SOPN 6 mL 0    Sig: Inject 0.5 mg into the skin once a week.     Endocrinology:  Diabetes - GLP-1 Receptor Agonists - semaglutide Failed - 03/24/2024  4:09 PM      Failed - HBA1C in normal range and within 180 days    Hemoglobin A1C  Date Value Ref Range Status  09/08/2023 6.5 (A) 4.0 - 5.6 % Final  07/08/2012 6.1 4.2 -  6.3 % Final    Comment:    The American Diabetes Association recommends that a primary goal of therapy should be <7% and that physicians should reevaluate the treatment regimen in patients with HbA1c values consistently >8%.    HbA1c, POC (prediabetic range)  Date Value Ref Range Status  09/28/2018 6.2 5.7 - 6.4 % Final   HbA1c, POC (controlled diabetic range)  Date Value Ref Range Status  01/09/2019 5.9 0.0 - 7.0 % Final   Hgb A1c MFr Bld  Date Value Ref Range Status  01/29/2020 5.8 (H) 4.8 - 5.6 % Final    Comment:    (NOTE) Pre diabetes:          5.7%-6.4% Diabetes:              >6.4% Glycemic control for   <7.0% adults with diabetes          Failed - Cr in normal range and within 360 days    Creat  Date Value Ref Range Status  03/22/2024 1.74 (H) 0.50 - 1.05 mg/dL Final   Creatinine, Urine  Date Value Ref Range Status  01/18/2024 135 20 - 275 mg/dL Final         Passed - Valid encounter within last 6 months    Recent Outpatient Visits           3 days ago Elevated liver enzymes   [facility], MD   1 week ago Hospital discharge follow-up   [facility], MD   4 weeks ago Eosinophilic asthma   [facility] Al[facility] Cory, MD   1 month ago Chronic maxillary sinusitis   [facility] Al[facility], MD   1 month ago Small vessel disease [facility])   [facility] Al[facility] Cory, MD       Future Appointments             In 1 month Carlynn Purl, Danna Hefty, MD [facility], [facility]

## 2024-03-24 NOTE — Telephone Encounter (Signed)
 Requested medication (s) are due for refill today - see listed medications with dates  Requested medication (s) are on the active medication list - yes  Future visit scheduled -yes  Last refill: Breztri- listed as historical medication , off protocol- provider review                   Cyclobenzaprine- 03/22/24 #90- non delegated Rx                  Diltiazem- 03/22/24 #90                  Ondansetron- 01/27/24 #30- non delegated Rx    Notes to clinic: see above  Requested Prescriptions  Pending Prescriptions Disp Refills   budeson-glycopyrrolate-formoterol (BREZTRI AEROSPHERE) 160-9-4.8 MCG/ACT AERO      Sig: Inhale 2 puffs into the lungs in the morning and at bedtime.     Off-Protocol Failed - 03/24/2024  4:09 PM      Failed - Medication not assigned to a protocol, review manually.      Passed - Valid encounter within last 12 months    Recent Outpatient Visits           3 days ago Elevated liver enzymes   [facility] Al[facility], MD   1 week ago Hospital discharge follow-up   [facility], MD   4 weeks ago Eosinophilic asthma   [facility] Al[facility] Cory, MD   1 month ago Chronic maxillary sinusitis   [facility] Al[facility], MD   1 month ago Small vessel disease [facility])   [facility] Alba Cory, MD       Future Appointments             In 1 month Alba Cory, MD [facility]             cyclobenzaprine (FLEXERIL) 5 MG tablet 90 tablet 0    Sig: Take 1 tablet (5 mg total) by mouth at bedtime.     Not Delegated - Analgesics:  Muscle Relaxants Failed - 03/24/2024  4:09 PM      Failed - This refill cannot be delegated      Passed - Valid encounter within last 6 months    Recent Outpatient Visits           3 days ago Elevated liver enzymes   [facility], MD   1 week ago Hospital discharge follow-up   [facility]   4 weeks ago Eosinophilic asthma   [facility] Al[facility], MD   1 month ago Chronic maxillary sinusitis   [facility], MD   1 month ago Small vessel disease [facility])   [place] Al[facility] Cory, MD       Future Appointments             In 1 month Alba Cory, MD [facility]             diltiazem (CARDIZEM CD) 180 MG 24 hr capsule 90 capsule 0    Sig: Take 1 capsule (180 mg total) by mouth daily.     Cardiovascular: Calcium Channel Blockers 3 Failed - 03/24/2024  4:09 PM      Failed - ALT  in normal range and within 360 days    ALT  Date Value Ref Range Status  03/22/2024 63 (H) 6 - 29 U/L Final   SGPT (ALT)  Date Value Ref Range Status  07/07/2012 29 U/L Final    Comment:    12-78 NOTE: NEW REFERENCE RANGE 11/20/2011          Failed - AST in normal range and within 360 days    AST  Date Value Ref Range Status  03/22/2024 47 (H) 10 - 35 U/L Final   SGOT(AST)  Date Value Ref Range Status  07/07/2012 31 15 - 37 Unit/L Final         Failed - Cr in normal range and within 360 days    Creat  Date Value Ref Range Status  03/22/2024 1.74 (H) 0.50 - 1.05 mg/dL Final   Creatinine, Urine  Date Value Ref Range Status  01/18/2024 135 20 - 275 mg/dL Final         Passed - Last BP in normal range    BP Readings from Last 1 Encounters:  03/21/24 110/70         Passed - Last Heart Rate in normal range    Pulse Readings from Last 1 Encounters:  03/21/24 (!) 106         Passed - Valid encounter within last 6 months    Recent Outpatient Visits           3 days ago Elevated liver enzymes   [facility], [facility]   1 week ago Hospital discharge follow-up    [facility], [facility]   4 weeks ago Eosinophilic asthma   [place] [facility] Alba Cory, MD   1 month ago Chronic maxillary sinusitis   [facility], Danna Hefty, MD   1 month ago Small vessel disease [facility])   [facility] Alba Cory, MD       Future Appointments             In 1 month Alba Cory, MD [facility]             ondansetron (ZOFRAN-ODT) 4 MG disintegrating tablet 30 tablet 0    Sig: Take 1 tablet (4 mg total) by mouth every 8 (eight) hours as needed for nausea.     Not Delegated - Gastroenterology: Antiemetics - ondansetron Failed - 03/24/2024  4:09 PM      Failed - This refill cannot be delegated      Failed - AST in normal range and within 360 days    AST  Date Value Ref Range Status  03/22/2024 47 (H) 10 - 35 U/L Final   SGOT(AST)  Date Value Ref Range Status  07/07/2012 31 15 - 37 Unit/L Final         Failed - ALT in normal range and within 360 days    ALT  Date Value Ref Range Status  03/22/2024 63 (H) 6 - 29 U/L Final   SGPT (ALT)  Date Value Ref Range Status  07/07/2012 29 U/L Final    Comment:    12-78 NOTE: NEW REFERENCE RANGE 11/20/2011          Passed - Valid encounter within last 6 months    Recent Outpatient Visits           3 days ago Elevated liver enzymes  [facility], MD   1 week ago Hospital discharge follow-up   [facility], [facility]   4 weeks ago Eosinophilic asthma   [place] [facility] Alba Cory, MD   1 month ago Chronic maxillary sinusitis   [facility] Al[facility] Cory, MD   1 month ago Small vessel disease [facility])   [facility] Al[facility] Cory, MD       Future Appointments             In 1 month  Alba Cory, MD [facility], [facility]            Signed Prescriptions Disp Refills   Semaglutide,0.25 or 0.5MG /DOS, (OZEMPIC, 0.25 OR 0.5 MG/DOSE,) 2 MG/3ML SOPN 6 mL 0    Sig: Inject 0.5 mg into the skin once a week.     Endocrinology:  Diabetes - GLP-1 Receptor Agonists - semaglutide Failed - 03/24/2024  4:09 PM      Failed - HBA1C in normal range and within 180 days    Hemoglobin A1C  Date Value Ref Range Status  09/08/2023 6.5 (A) 4.0 - 5.6 % Final  07/08/2012 6.1 4.2 - 6.3 % Final    Comment:    The American Diabetes Association recommends that a primary goal of therapy should be <7% and that physicians should reevaluate the treatment regimen in patients with HbA1c values consistently >8%.    HbA1c, POC (prediabetic range)  Date Value Ref Range Status  09/28/2018 6.2 5.7 - 6.4 % Final   HbA1c, POC (controlled diabetic range)  Date Value Ref Range Status  01/09/2019 5.9 0.0 - 7.0 % Final   Hgb A1c MFr Bld  Date Value Ref Range Status  01/29/2020 5.8 (H) 4.8 - 5.6 % Final    Comment:    (NOTE) Pre diabetes:          5.7%-6.4% Diabetes:              >6.4% Glycemic control for   <7.0% adults with diabetes          Failed - Cr in normal range and within 360 days    Creat  Date Value Ref Range Status  03/22/2024 1.74 (H) 0.50 - 1.05 mg/dL Final   Creatinine, Urine  Date Value Ref Range Status  01/18/2024 135 20 - 275 mg/dL Final         Passed - Valid encounter within last 6 months    Recent Outpatient Visits           3 days ago Elevated liver enzymes   [facility] Alba Cory, MD   1 week ago Hospital discharge follow-up   [facility] Al[facility], MD   4 weeks ago Eosinophilic asthma   [facility] Alba Cory, MD   1 month ago Chronic maxillary sinusitis   [facility] Alba Cory, MD   1 month ago Small  vessel disease [facility])   [facility] Alba Cory, MD       Future Appointments             In 1 month Carlynn Purl, Danna Hefty, MD [facility], [facility]               Requested Prescriptions  Pending Prescriptions Disp Refills   budeson-glycopyrrolate-formoterol (BREZTRI AEROSPHERE) 160-9-4.8 MCG/ACT AERO  Sig: Inhale 2 puffs into the lungs in the morning and at bedtime.     Off-Protocol Failed - 03/24/2024  4:09 PM      Failed - Medication not assigned to a protocol, review manually.      Passed - Valid encounter within last 12 months    Recent Outpatient Visits           3 days ago Elevated liver enzymes   [facility] Alba Cory, MD   1 week ago Hospital discharge follow-up   [facility] Alba Cory, MD   4 weeks ago Eosinophilic asthma   [facility] Alba Cory, MD   1 month ago Chronic maxillary sinusitis   [facility] Alba Cory, MD   1 month ago Small vessel disease [facility])   [place] [facility] Alba Cory, MD       Future Appointments             In 1 month Alba Cory, MD [facility]             cyclobenzaprine (FLEXERIL) 5 MG tablet 90 tablet 0    Sig: Take 1 tablet (5 mg total) by mouth at bedtime.     Not Delegated - Analgesics:  Muscle Relaxants Failed - 03/24/2024  4:09 PM      Failed - This refill cannot be delegated      Passed - Valid encounter within last 6 months    Recent Outpatient Visits           3 days ago Elevated liver enzymes   [facility] Alba Cory, MD   1 week ago Hospital discharge follow-up   [facility] Al[facility], MD   4 weeks ago Eosinophilic asthma   [facility] Alba Cory, MD   1 month ago  Chronic maxillary sinusitis   [facility] Alba Cory, MD   1 month ago Small vessel disease [facility])   [facility] Alba Cory, MD       Future Appointments             In 1 month Alba Cory, MD [facility]             diltiazem (CARDIZEM CD) 180 MG 24 hr capsule 90 capsule 0    Sig: Take 1 capsule (180 mg total) by mouth daily.     Cardiovascular: Calcium Channel Blockers 3 Failed - 03/24/2024  4:09 PM      Failed - ALT in normal range and within 360 days    ALT  Date Value Ref Range Status  03/22/2024 63 (H) 6 - 29 U/L Final   SGPT (ALT)  Date Value Ref Range Status  07/07/2012 29 U/L Final    Comment:    12-78 NOTE: NEW REFERENCE RANGE 11/20/2011          Failed - AST in normal range and within 360 days    AST  Date Value Ref Range Status  03/22/2024 47 (H) 10 - 35 U/L Final   SGOT(AST)  Date Value Ref Range Status  07/07/2012 31 15 - 37 Unit/L Final         Failed - Cr in normal range and within 360 days    Creat  Date Value Ref Range Status  03/22/2024 1.74 (  H) 0.50 - 1.05 mg/dL Final   Creatinine, Urine  Date Value Ref Range Status  01/18/2024 135 20 - 275 mg/dL Final         Passed - Last BP in normal range    BP Readings from Last 1 Encounters:  03/21/24 110/70         Passed - Last Heart Rate in normal range    Pulse Readings from Last 1 Encounters:  03/21/24 (!) 106         Passed - Valid encounter within last 6 months    Recent Outpatient Visits           3 days ago Elevated liver enzymes   [facility], MD   1 week ago Hospital discharge follow-up   [facility] Al[facility], MD   4 weeks ago Eosinophilic asthma   [facility] Al[facility], MD   1 month ago Chronic maxillary sinusitis   [facility],  Danna Hefty, MD   1 month ago Small vessel disease [facility])   [facility] Alba Cory, MD       Future Appointments             In 1 month [facility]ba Cory, [facility]             ondansetron (ZOFRAN-ODT) 4 MG disintegrating tablet 30 tablet 0    Sig: Take 1 tablet (4 mg total) by mouth every 8 (eight) hours as needed for nausea.     Not Delegated - Gastroenterology: Antiemetics - ondansetron Failed - 03/24/2024  4:09 PM      Failed - This refill cannot be delegated      Failed - AST in normal range and within 360 days    AST  Date Value Ref Range Status  03/22/2024 47 (H) 10 - 35 U/L Final   SGOT(AST)  Date Value Ref Range Status  07/07/2012 31 15 - 37 Unit/L Final         Failed - ALT in normal range and within 360 days    ALT  Date Value Ref Range Status  03/22/2024 63 (H) 6 - 29 U/L Final   SGPT (ALT)  Date Value Ref Range Status  07/07/2012 29 U/L Final    Comment:    12-78 NOTE: NEW REFERENCE RANGE 11/20/2011          Passed - Valid encounter within last 6 months    Recent Outpatient Visits           3 days ago Elevated liver enzymes   [facility] Alba [facility], MD   1 week ago Hospital discharge follow-up   [facility] Alba Cory, MD   4 weeks ago Eosinophilic asthma   [place] [facility] Al[facility] Cory, MD   1 month ago Chronic maxillary sinusitis   [facility] Al[facility], MD   1 month ago Small vessel disease [facility])   [facility] Alba Cory, MD       Future Appointments             In 1 month Alba Cory, MD [facility], [facility]            Signed Prescriptions Disp Refills   Semaglutide,0.25 or 0.5MG /DOS, (OZEMPIC, 0.25 OR 0.5 MG/DOSE,) 2 MG/3ML [facility]  6 mL 0    Sig: Inject 0.5 mg into the skin once a week.      Endocrinology:  Diabetes - GLP-1 Receptor Agonists - semaglutide Failed - 03/24/2024  4:09 PM      Failed - HBA1C in normal range and within 180 days    Hemoglobin A1C  Date Value Ref Range Status  09/08/2023 6.5 (A) 4.0 - 5.6 % Final  07/08/2012 6.1 4.2 - 6.3 % Final    Comment:    The American Diabetes Association recommends that a primary goal of therapy should be <7% and that physicians should reevaluate the treatment regimen in patients with HbA1c values consistently >8%.    HbA1c, POC (prediabetic range)  Date Value Ref Range Status  09/28/2018 6.2 5.7 - 6.4 % Final   HbA1c, POC (controlled diabetic range)  Date Value Ref Range Status  01/09/2019 5.9 0.0 - 7.0 % Final   Hgb A1c MFr Bld  Date Value Ref Range Status  01/29/2020 5.8 (H) 4.8 - 5.6 % Final    Comment:    (NOTE) Pre diabetes:          5.7%-6.4% Diabetes:              >6.4% Glycemic control for   <7.0% adults with diabetes          Failed - Cr in normal range and within 360 days    Creat  Date Value Ref Range Status  03/22/2024 1.74 (H) 0.50 - 1.05 mg/dL Final   Creatinine, Urine  Date Value Ref Range Status  01/18/2024 135 20 - 275 mg/dL Final         Passed - Valid encounter within last 6 months    Recent Outpatient Visits           3 days ago Elevated liver enzymes   [facility] Alba Cory, MD   1 week ago Hospital discharge follow-up   [facility] Al[facility], MD   4 weeks ago Eosinophilic asthma   [facility] Alba Cory, MD   1 month ago Chronic maxillary sinusitis   [facility] Alba Cory, MD   1 month ago Small vessel disease [facility])   [facility] Alba Cory, MD       Future Appointments             In 1 month Carlynn Purl, Danna Hefty, MD [facility], [facility]

## 2024-03-27 ENCOUNTER — Other Ambulatory Visit: Payer: Self-pay | Admitting: Family Medicine

## 2024-03-27 DIAGNOSIS — Z1231 Encounter for screening mammogram for malignant neoplasm of breast: Secondary | ICD-10-CM

## 2024-03-28 ENCOUNTER — Other Ambulatory Visit: Payer: Self-pay

## 2024-03-28 NOTE — Patient Outreach (Signed)
 Care Management  Transitions of Care Program Transitions of Care Post-discharge week 3  03/28/2024 Name: Brandy Johnston MRN: [id number] DOB: 03/03/1961  Subjective: [name] is a 63 y.o. year old female who is a primary care patient of Alba Cory, MD. The Care Management team was unable to reach the patient by phone to assess and address transitions of care needs.   Plan:  Patient  Outreach attempt is in the course of  VBCI  30-day TOC program. Pt previously agreed and is enrolled in the  program due to potential risk for readmission and/or high utilization. Unfortunately, I was not able to speak with the patient in regards to recent hospital discharge   Patient's voicemail has  a generic greeting. To maintain HIPAA compliance, left message including only VBCI CM contact information and a request for a call back .  Additional outreach attempts will be made to reach the patient enrolled in the [facility] Program (Post Inpatient/ED Visit).  Susa Loffler , BSN, RN [facility]   VBCI-Population Health RN Care Manager Direct Dial [phone]  Fax: [phone] Website: [email or web address]

## 2024-03-29 ENCOUNTER — Telehealth: Payer: Self-pay

## 2024-03-29 NOTE — Patient Outreach (Signed)
 Care Management  Transitions of Care Program Transitions of Care Post-discharge week 3   03/29/2024 Name: Brandy Johnston MRN: [id number] DOB: 18-Mar-1961  Subjective: Brandy Johnston is a 63 y.o. year old female who is a primary care patient of Alba Cory, MD. The Care Management team Engaged with patient Engaged with patient by telephone to assess and address transitions of care needs.   Consent to Services:  Patient was given information about care management services, agreed to services, and gave verbal consent to participate.   Assessment:   Patient/ Caregiver  voices no new complaints or concerns  and has not developed/ reported any new medical issues / Dx or acute changes. - since last follow-up call for most recent   Hospital stay     3/15-3/17/ 2025  ( previous 2/ 19-2/24)  No reported or reviewed Hospital Readmissions / No Urgent Care Visits / Transfers for Acute Change in condition .   MD/Specialist or Consultant visits reported since last follow-up call PCP 3/25  She was seen by her MA Case Manager 03/28/24   Patient was Alert & O x 4 ,responsive, able to voice needs, cooperative with visit   There were no noted or reported cognitive/ mental status changes during this assessment General status reviewed  patient presents as expected for age and current situation with  finding as noted in previous sections of assessment  Patient denies changes in ADL function or IADLs  at this time. She has PCS services 52 hrs week   Patient mood, expressions, emotional tone, insight, judgement and thought content as expected with no notable or reported change No reports of new onset or increased / uncontrolled pain, has chronic pain managed with medicinal and non-medicinal interventions.  No changes in skin, no rashes, wounds or open areas - she continues with Nystatin oral swish for oral thrush, slowly resolving this is 2nd rond of TX ( just completed steroid taper) Diet reviewed and education  provided as needed to promote good lifestyle choices and healthy meals. Such- as eating plenty of whole grains, nuts, vegetables, and fruits, lean meats, poultry, fish, beans, eggs, and nuts sizes (Unless otherwise restricted) Limit saturated and trans fats, sodium, and added sugars. Make sure to drink plenty of fluids every day. Be conscious of  portion sizes   Therapy Services [facility]   SN  / PT  /  OT    Medication reconciliation/ review completed based on most recent medication list in EHR; and in review of recent Provider follow-up appointments- confirmed patient obtained / is taking all newly prescribed medications as instructed and is aware of any changes to previous medications regimen including dosage adjustments.  Reports  no new changes in medication regimen or treatment plan during this review period.  She received and [place] using her Libre device Her BG was 104 today She has parameters set for 70 and 250 ( High and Low )  Medications and current treatments are effective for use intended. No adverse Drug reactions  reported No changes or modifications indicated at this time. Amb Referral for Polypharmacy scheduled for a call 4/4 25   Patient self-Caregiver manages  medications Patient is able to take medications without difficulty;  denies questions/ concerns and reports no barriers to medication adherence at this time. She dies report the amount of medications is cumbersome She changes to a mail order pharmacy and her PCP eliminated several medications .   Patient / Caregiver educated on red flag s/s to watch for  based on current discharge diagnosis and was encouraged to report, any changes in baseline or  medication regimen,   or any new unmanaged side effects or symptoms not relieved with interventions  to PCP and / or the  VBCI Case Management team .          SDOH Interventions    Flowsheet Row Telephone from 02/22/2024 in [place] [facility] Office Visit from  12/09/2022 in [facility] Office Visit from 01/15/2021 in [facility]  SDOH Interventions     Food Insecurity Interventions Intervention Not Indicated Intervention Not Indicated --  Housing Interventions Intervention Not Indicated Intervention Not Indicated Intervention Not Indicated  Transportation Interventions Intervention Not Indicated, Patient Resources (Friends/Family), Payor Benefit Intervention Not Indicated Intervention Not Indicated  Utilities Interventions Intervention Not Indicated Intervention Not Indicated --  Alcohol Usage Interventions -- Intervention Not Indicated (Score <7) --  Financial Strain Interventions -- Intervention Not Indicated Other (Comment)   [doing better now that has disability]  Stress Interventions -- Intervention Not Indicated Intervention Not Indicated  Social Connections Interventions -- Intervention Not Indicated --        Goals Addressed             This Visit's Progress    TOC Care Plan       Current Barriers:  Medication management multiple changes  Provider appointments PCP Pulmonology Cardiology-  Date of PCP follow-up appointment?: 03/17/24 Follow-up Provider: Alba Cory, MD Specialist Hospital Follow-up appointment confirmed?: Yes Date of Specialist follow-up appointment?: 04/07/24 Follow-Up Specialty Provider:: [facility] CARDIOLOGY [place] Apr 07, 2024 4:20 PM-   [facility] GI MEDICINE [place] (Apr 10, 2024 12:30 PM   [facility] [place] Apr 13, 2024 3:30 PM  MRI Cardiac Morphology With and Without Contrast with [facility] Apr 25, 2024 9:30 AM Transportation Uses Benefits, [facility] Public and Family Friends  Chronic Disease Management support and education needs related to DMII and Pulmonary Disease   RNCM Clinical Goal(s):  Patient will work with the Care Management team over the next 30 days to address Transition of Care Barriers: Medication  Management Support at home Provider appointments take all medications exactly as prescribed and will call provider for medication related questions as evidenced by no missed medication doses attend all scheduled medical appointments: as evidenced by no missed follow-up visits  through collaboration with RN Care manager, provider, and care team.   Interventions: Evaluation of current treatment plan related to  self management and patient's adherence to plan as established by provider  Transitions of Care:  Goal on track:  Yes. Doctor Visits  - discussed the importance of doctor visits [facility] RN/OT/PT  Post discharge activity limitations prescribed by provider reviewed Reviewed Signs and symptoms of infection  Asthma: (Status:Goal on track:  Yes.) Short Term Goal Provided instruction about proper use of medications used for management of Asthma including inhalers Provided education about and advised patient to utilize infection prevention strategies to reduce risk of respiratory infection Discussed the importance of adequate rest and management of fatigue with Asthma Screening for signs and symptoms of depression related to chronic disease state  Assessed social determinant of health barriers   Patient Goals/Self-Care Activities: Participate in Transition of Care Program/Attend TOC scheduled calls Take all medications as prescribed Attend all scheduled provider appointments Call pharmacy for medication refills 3-7 days in advance of running out of medications Perform all self care activities independently  Perform IADL's (shopping,  preparing meals, housekeeping, managing finances) independently Call provider office for new concerns or questions   Follow Up Plan:  Telephone follow up appointment with care management team member scheduled for:  04/05/24 @ 10:00am   The patient has been provided with contact information for the care management team and has been advised to call with any  health related questions or concerns.           Follow-up Plan 1.Continue with Libre device and checking accuracy with Fingerstick as needed Report any reading > 250 more than 3 times in a row. 2. Continue Weekly SN and Home Health Visits with [facility] [phone]) 3. Follow-up with med rec after mail,order is completed [facility])  ( [facility]I Amb referral was made 01/2024 for Polypharmacy) call 03/31/24 She reports PCP did review and discontinue several medications  4. Continue Nystatin for Oral Trush until resolved   Routine follow-up and on-going assessment evaluation and education of disease processes, recommended interventions for both chronic and acute medical conditions , will occur during each weekly visit along with ongoing review of symptoms ,medication reviews and reconciliation. Any updates , inconsistencies, discrepancies or acute care concerns will be addressed and routed to the correct Practitioner if indicated   Based on current information and Insurance plan -Reviewed benefits available to patient, including details about eligibility options for care if any area of needs were identified.  Reviewed patients ability to access and / or navigating the benefits system..Amb Referral made if indicted , refer to orders section of note for details   Please refer to Care Plan for goals and interventions -Effectiveness of interventions, symptom management and outcomes will be evaluated  weekly during [facility] 30-day Program Outreach calls  . Any necessary  changes and updates to Care Plan will be completed episodically    Reviewed goals for care Patient verbalizes understanding of instructions and care plan provided. Patient was encouraged to make informed decisions about their care, actively participate in managing their health condition, and implement lifestyle changes as needed to promote independence and self-management of health care   Patient was encouraged to Contact PCP with any changes in  baseline or  medication regimen,  changes in health status  /  well-being, safety concerns, including falls any questions or concerns regarding ongoing medical care, any difficulty obtaining or picking up prescriptions, any changes or worsening in condition- including  symptoms not relieved  with interventions    The patient has been provided with contact information for the care management team and has been advised to call with any health-related questions or concerns. Follow up call  with Care Team  as scheduled,or sooner should any new problems arise.   Susa Loffler , BSN, RN [facility]   VBCI-Population Health RN Care Manager Direct Dial [phone]  Fax: [phone] Website: [email or web address]

## 2024-03-29 NOTE — Patient Instructions (Addendum)
 Visit Information  Thank you for taking time to visit with me today. Please don't hesitate to contact me if I can be of assistance to you before our next scheduled telephone appointment.  Our next appointment is by telephone on 04/05/24 at 10:30am   Following is a copy of your care plan:   Goals Addressed             This Visit's Progress    TOC Care Plan       Current Barriers:  Medication management multiple changes  Provider appointments PCP Pulmonology Cardiology-  Date of PCP follow-up appointment?: 03/17/24 Follow-up Provider: Alba Cory, MD Specialist Hospital Follow-up appointment confirmed?: Yes Date of Specialist follow-up appointment?: 04/07/24 Follow-Up Specialty Provider:: [facility] CARDIOLOGY [place] Apr 07, 2024 4:20 PM-   [facility] GI MEDICINE [place] (Apr 10, 2024 12:30 PM   [facility] [place] Apr 13, 2024 3:30 PM  MRI Cardiac Morphology With and Without Contrast with [facility] Apr 25, 2024 9:30 AM Transportation Uses Benefits, [facility] Public and Family Friends  Chronic Disease Management support and education needs related to DMII and Pulmonary Disease   RNCM Clinical Goal(s):  Patient will work with the Care Management team over the next 30 days to address Transition of Care Barriers: Medication Management Support at home Provider appointments take all medications exactly as prescribed and will call provider for medication related questions as evidenced by no missed medication doses attend all scheduled medical appointments: as evidenced by no missed follow-up visits  through collaboration with RN Care manager, provider, and care team.   Interventions: Evaluation of current treatment plan related to  self management and patient's adherence to plan as established by provider  Transitions of Care:  Goal on track:  Yes. Doctor Visits  - discussed the importance of doctor visits [facility] RN/OT/PT  Post discharge activity  limitations prescribed by provider reviewed Reviewed Signs and symptoms of infection  Asthma: (Status:Goal on track:  Yes.) Short Term Goal Provided instruction about proper use of medications used for management of Asthma including inhalers Provided education about and advised patient to utilize infection prevention strategies to reduce risk of respiratory infection Discussed the importance of adequate rest and management of fatigue with Asthma Screening for signs and symptoms of depression related to chronic disease state  Assessed social determinant of health barriers   Patient Goals/Self-Care Activities: Participate in Transition of Care Program/Attend TOC scheduled calls Take all medications as prescribed Attend all scheduled provider appointments Call pharmacy for medication refills 3-7 days in advance of running out of medications Perform all self care activities independently  Perform IADL's (shopping, preparing meals, housekeeping, managing finances) independently Call provider office for new concerns or questions   Follow Up Plan:  Telephone follow up appointment with care management team member scheduled for:  04/05/24 @ 10:00am   The patient has been provided with contact information for the care management team and has been advised to call with any health related questions or concerns.          Medication review  Reviewed current home medications -- provided education as needed. Patient is aware of potential side effects and was encouraged to notify PCP for any adverse side effects or unwanted symptoms not relieved with interventions    Reviewed goals for care Patient/ Caregiver verbalizes understanding of instructions with the plan of care . The  Patient / Caregiver was encouraged to make informed decisions about care, actively participate in managing health  conditions, and implement lifestyle changes as needed to promote independence and self-management of healthcare.  SDOH screenings have been completed and addressed if indicted.  There are no reported barriers to care.    Follow-up Plan  1.Continue with Libre device and checking accuracy with Fingerstick as needed Report any reading > 250 more than 3 times in a row. 2. Continue Weekly SN and Home Health Visits with [facility] [phone]) 3. Follow-up with med rec after mail,order is completed [facility])  ( [facility]BCI Amb referral was made 01/2024 for Polypharmacy) Call scheduled 03/31/24 She reports PCP did review and discontinue several medications  4. Continue Nystatin for Oral Trush until resolved      VBCI Case Management Nurse will provide follow-up and on-going assessment ,evaluation and education of disease processes, recommended interventions for both chronic and acute medical conditions ,  along with ongoing review of symptoms ,medication reviews / reconciliation during each weekly call . Any updates , inconsistencies, discrepancies or acute care concerns will be addressed and routed to the correct Practitioner if indicated     [facility]  Please call the care guide team at [phone]  if you need to cancel or reschedule your appointment . For scheduled calls -Three attempts will be made to reach you -if the scheduled call is missed or  we are unable to reach the you after 3 attempts no additional outreach attempts will be made and the TOC follow-up will be closed .   If you need to speak to a Nurse you may  call me directly at the number below or if I am unavailable,and  your need is urgent  please call the main VBCI number at [phone] and ask to speak with one of the [facility] ( Transition of Care )  Nurses  .  Patient was encouraged to Contact PCP with any changes in baseline or  medication regimen,  changes in health status  /  well-being, safety concerns, including falls any questions or concerns regarding ongoing medical care, any difficulty obtaining or picking up prescriptions, any  changes or worsening in condition- including  symptoms not relieved  with interventions                                                                            Additionally, If you experience worsening of your symptoms, develop shortness of breath, If you are experiencing a medical emergency,  develop suicidal or homicidal thoughts you must seek medical attention immediately by calling 911 or report to your local emergency department or urgent care.   If you have a non-emergency medical problem during routine business hours, please contact your provider's office and ask to speak with a nurse.       Please take the time to read instructions/literature along with the possible adverse reactions/side effects for all the Medicines that have been prescribed to you. Only take newly prescribed  Medications after you have completely understood and accept all the possible adverse reactions/side effects.   Do not take more than prescribed Medications for  Pain, Sleep and Anxiety. Do not drive when taking Pain medications or sleep aid/ insomnia  medications It is not advisable to combine anxiety, sleep and  pain medications without talking with your primary care practitioner    If you are experiencing a Mental Health or Behavioral Health Crisis or need someone to talk to Please call the Suicide and Crisis Lifeline: 988 You may also call the [place] National Suicide Prevention Lifeline: 843-352-0167 or TTY: 714-841-0354 TTY 804-573-3351) to talk to a trained counselor.  You may call the Behavioral Health Crisis Line at 570-737-3201, at any time, 24 hours a day, 7 days a week- however If you are in danger or need immediate medical attention, call 911.   If you would like help to quit smoking, call 1-800-QUIT-NOW ( 670-549-2180) OR Espaol: 1-855-Djelo-Ya (7-564-332-9518) o para ms informacin haga clic aqu or Text READY to 841-660 to register via text.   The patient has been provided with contact  information for the care management team and has been advised to call with any health-related questions or concerns. Follow up call  with Care Team  as scheduled,or sooner should any new problems arise.  Susa Loffler , BSN, RN [place]   VBCI-Population Health RN Care Manager Direct Dial [phone]  Website: [email or web address]

## 2024-03-30 ENCOUNTER — Ambulatory Visit: Payer: Medicare HMO | Admitting: Pulmonary Disease

## 2024-03-30 DIAGNOSIS — E1142 Type 2 diabetes mellitus with diabetic polyneuropathy: Secondary | ICD-10-CM | POA: Diagnosis not present

## 2024-03-30 DIAGNOSIS — J8283 Eosinophilic asthma: Secondary | ICD-10-CM | POA: Diagnosis not present

## 2024-03-30 DIAGNOSIS — I421 Obstructive hypertrophic cardiomyopathy: Secondary | ICD-10-CM | POA: Diagnosis not present

## 2024-03-30 DIAGNOSIS — J45901 Unspecified asthma with (acute) exacerbation: Secondary | ICD-10-CM | POA: Diagnosis not present

## 2024-03-30 DIAGNOSIS — N183 Chronic kidney disease, stage 3 unspecified: Secondary | ICD-10-CM | POA: Diagnosis not present

## 2024-03-30 DIAGNOSIS — I129 Hypertensive chronic kidney disease with stage 1 through stage 4 chronic kidney disease, or unspecified chronic kidney disease: Secondary | ICD-10-CM | POA: Diagnosis not present

## 2024-03-30 DIAGNOSIS — E1122 Type 2 diabetes mellitus with diabetic chronic kidney disease: Secondary | ICD-10-CM | POA: Diagnosis not present

## 2024-03-30 DIAGNOSIS — E1165 Type 2 diabetes mellitus with hyperglycemia: Secondary | ICD-10-CM | POA: Diagnosis not present

## 2024-03-30 DIAGNOSIS — T380X5D Adverse effect of glucocorticoids and synthetic analogues, subsequent encounter: Secondary | ICD-10-CM | POA: Diagnosis not present

## 2024-03-31 ENCOUNTER — Other Ambulatory Visit: Payer: Self-pay | Admitting: Pharmacist

## 2024-04-04 DIAGNOSIS — I2510 Atherosclerotic heart disease of native coronary artery without angina pectoris: Secondary | ICD-10-CM | POA: Diagnosis not present

## 2024-04-04 DIAGNOSIS — J8283 Eosinophilic asthma: Secondary | ICD-10-CM | POA: Diagnosis not present

## 2024-04-04 DIAGNOSIS — M5440 Lumbago with sciatica, unspecified side: Secondary | ICD-10-CM | POA: Diagnosis not present

## 2024-04-04 DIAGNOSIS — E1151 Type 2 diabetes mellitus with diabetic peripheral angiopathy without gangrene: Secondary | ICD-10-CM | POA: Diagnosis not present

## 2024-04-04 DIAGNOSIS — E1122 Type 2 diabetes mellitus with diabetic chronic kidney disease: Secondary | ICD-10-CM | POA: Diagnosis not present

## 2024-04-04 DIAGNOSIS — K5900 Constipation, unspecified: Secondary | ICD-10-CM | POA: Diagnosis not present

## 2024-04-04 DIAGNOSIS — E1142 Type 2 diabetes mellitus with diabetic polyneuropathy: Secondary | ICD-10-CM | POA: Diagnosis not present

## 2024-04-04 DIAGNOSIS — Z87891 Personal history of nicotine dependence: Secondary | ICD-10-CM | POA: Diagnosis not present

## 2024-04-04 DIAGNOSIS — N189 Chronic kidney disease, unspecified: Secondary | ICD-10-CM | POA: Diagnosis not present

## 2024-04-04 DIAGNOSIS — J4550 Severe persistent asthma, uncomplicated: Secondary | ICD-10-CM | POA: Diagnosis not present

## 2024-04-04 DIAGNOSIS — D721 Eosinophilia, unspecified: Secondary | ICD-10-CM | POA: Diagnosis not present

## 2024-04-04 DIAGNOSIS — I130 Hypertensive heart and chronic kidney disease with heart failure and stage 1 through stage 4 chronic kidney disease, or unspecified chronic kidney disease: Secondary | ICD-10-CM | POA: Diagnosis not present

## 2024-04-04 DIAGNOSIS — G4730 Sleep apnea, unspecified: Secondary | ICD-10-CM | POA: Diagnosis not present

## 2024-04-04 DIAGNOSIS — I700 Atherosclerosis of aorta: Secondary | ICD-10-CM | POA: Diagnosis not present

## 2024-04-04 DIAGNOSIS — G43909 Migraine, unspecified, not intractable, without status migrainosus: Secondary | ICD-10-CM | POA: Diagnosis not present

## 2024-04-04 DIAGNOSIS — J4489 Other specified chronic obstructive pulmonary disease: Secondary | ICD-10-CM | POA: Diagnosis not present

## 2024-04-04 DIAGNOSIS — I471 Supraventricular tachycardia, unspecified: Secondary | ICD-10-CM | POA: Diagnosis not present

## 2024-04-04 DIAGNOSIS — I509 Heart failure, unspecified: Secondary | ICD-10-CM | POA: Diagnosis not present

## 2024-04-04 DIAGNOSIS — R269 Unspecified abnormalities of gait and mobility: Secondary | ICD-10-CM | POA: Diagnosis not present

## 2024-04-04 DIAGNOSIS — I422 Other hypertrophic cardiomyopathy: Secondary | ICD-10-CM | POA: Diagnosis not present

## 2024-04-04 DIAGNOSIS — K219 Gastro-esophageal reflux disease without esophagitis: Secondary | ICD-10-CM | POA: Diagnosis not present

## 2024-04-05 ENCOUNTER — Other Ambulatory Visit: Payer: Self-pay

## 2024-04-05 NOTE — Patient Outreach (Signed)
 Transition of Care week 4  Visit Note  04/05/2024  Name: Brandy Johnston MRN: [id number]          DOB: July 16, 1961  Situation: Patient enrolled in [facility] 30-day program. Visit completed with patient by telephone.   Background:   Initial Transition Care Management Follow-up Telephone Call    Past Medical History:  Diagnosis Date   Anemia    Chronic renal impairment, stage 3 (moderate) (HCC)    Diabetes mellitus without complication (HCC)    No longer on meds   GERD (gastroesophageal reflux disease)    Hyperlipidemia    Hypertension    Hypokalemia    Insomnia    Low calcium levels    Osteoarthrosis, hip    right hip   Paresthesia    Sickle cell anemia (HCC)    Sickle-cell trait (HCC)    Tension headache    1x/mo   Wears contact lenses     Assessment: Patient/ Caregiver  voices no new complaints or concerns  and has not developed/ reported any new medical issues / Dx or acute changes. - since last follow-up call for most recent   Hospital stay    3/15-3/17 / 2025  No reported or reviewed Hospital Readmissions / No Urgent Care Visits / Transfers for Acute Change in condition .  MD/Specialist or Consultant visits reported since last follow-up call She has her annual Wellness check 04/04/24 with  no recommended changes    Patient was Alert & O x 4 ,responsive, able to voice needs, cooperative with visit   There were no noted or reported cognitive/ mental status changes during this assessment General status reviewed  patient presents as expected for age and current situation with  finding as noted in previous sections of assessment. Her weight is stable @ 160 lbs No swelling to extremities  Patient denies changes in ADL function or IADLs  at this time. She has PCS 52 hrs week   Patient mood, expressions, emotional tone, insight, judgement  and thought content as expected with no notable or reported change No reports of new onset or increased / uncontrolled pain, has chronic pain  managed with medicinal and non-medicinal interventions.  No changes in skin, no rashes, wounds or open areas- Her Oral [name] is resolving She completed her steroid and Nystatin oral swish   Diet reviewed and education provided as needed to promote good lifestyle choices and healthy meals. Such- as eating plenty of whole grains, nuts, vegetables, and fruits, lean meats, poultry, fish, beans, eggs, and nuts sizes (Unless otherwise restricted) Limit saturated and trans fats, sodium, and added sugars. Make sure to drink plenty of fluids every day. Be conscious of  portion sizes  (if indicated- Follow any recommended Fluid restrictions ) Her BG was 107 she does nite is droos in am to 60's which she feels she is symptomatic Recc a light bedtime snack to see if that makes a difference  Therapy Services SN  / PT  with [facility] [phone])  Medication reconciliation/ review completed based on most recent medication list in EHR; and in review of recent Provider follow-up appointments- confirmed patient obtained / is taking all newly prescribed medications as instructed and is aware of any changes to previous medications regimen including dosage adjustments.  Her PCP did a Med recc and discontinued several medications She feels this is much more manageable now.   Medications and current treatments are effective for use intended. No adverse Drug reactions  reported No changes or modifications  indicated at this time. She did reports some SOB with extended exertions Encouraged her to use Albuterol PRN  Patient self-manages  medications Patient is able to take medications without difficulty;  denies questions/ concerns and reports no barriers to medication adherence at this time   Patient / Caregiver educated on red flag s/s to watch for based on current discharge diagnosis and was encouraged to report, any changes in baseline or  medication regimen,   or any new unmanaged side effects or symptoms not relieved  with interventions  to PCP and / or the  [facility]BCI Case Management team .      Patient Reported Symptoms:  Cognitive A& O x 4   Neurological Headaches, Weakness    HEENT Mouth or teeth pain, Other: Resolving Oral Thrush  Cardiovascular Fatigue    Respiratory Shortness of breath, Wheezing    Endocrine Headaches, Shortness of breath, Weakness or fatigue    Gastrointestinal Change in appetite    Genitourinary No symptoms reported    Integumentary No symptoms reported    Musculoskeletal Weakness, Unsteady gait    Psychosocial Alteration in eating habits     Vitals:   04/05/24 1103  BP: 109/70    Medications Reviewed Today     Reviewed by Johnnette Barrios, RN (Registered Nurse) on 04/05/24 at 1049  Med List Status: <None>   Medication Order Taking? Sig Documenting Provider Last Dose Status Informant  albuterol (VENTOLIN HFA) 108 (90 Base) MCG/ACT inhaler [id number] Yes SMARTSIG:2 Puff(s) By Mouth Every 4-6 Hours PRN [provider] Taking Active   aspirin (ASPIRIN 81) 81 MG chewable tablet [id number] Yes Chew 1 tablet (81 mg total) by mouth daily. Alba Cory, MD Taking Active Self  Atogepant (QULIPTA) 30 MG TABS [id number] Yes Take 1 tablet (30 mg total) by mouth daily at 12 noon. Alba Cory, MD Taking Active   benralizumab [facility] PEN) 30 MG/ML prefilled autoinjector [id number] Yes Inject into the skin. [provider] Taking Active   Blood Glucose Monitoring Suppl (CONTOUR NEXT ONE) KIT [id number] Yes USE TO TEST BLOOD SUGAR ONCE D [provider] Taking Active Self  Budeson-Glycopyrrol-Formoterol (BREZTRI AEROSPHERE) 160-9-4.8 MCG/ACT AERO [id number] Yes Inhale 2 puffs into the lungs in the morning and at bedtime. [provider] Taking Active   Cholecalciferol (VITAMIN D) 2000 units CAPS [id number] Yes Take 1 capsule (2,000 Units total) by mouth daily. Alba Cory, MD Taking Active Self  Continuous Glucose Receiver (FREESTYLE LIBRE 3  READER) DEVI [id number] Yes 1 each by Does not apply route as directed. Alba Cory, MD Taking Active   Continuous Glucose Sensor (FREESTYLE LIBRE 3 SENSOR) [place] [id number] Yes 1 Device by Does not apply route every 14 (fourteen) days. Alba Cory, MD Taking Active   cyclobenzaprine (FLEXERIL) 5 MG tablet [id number] Yes Take 1 tablet (5 mg total) by mouth at bedtime. Alba Cory, MD Taking Active   diclofenac Sodium (VOLTAREN) 1 % GEL [id number] Yes Apply 2 g topically 4 (four) times daily. Alba Cory, MD Taking Active   diltiazem (CARDIZEM CD) 180 MG 24 hr capsule [id number] Yes Take 1 capsule (180 mg total) by mouth daily. Alba Cory, MD Taking Active   empagliflozin (JARDIANCE) 25 MG TABS tablet [id number] Yes Take 1 tablet (25 mg total) by mouth daily before breakfast. Alba Cory, MD Taking Active   EPINEPHrine 0.3 mg/0.3 mL IJ SOAJ injection [id number] Yes Inject into the muscle. [provider] Taking Active   ezetimibe (ZETIA) 10 MG tablet [id number] Yes Take 1 tablet (  10 mg total) by mouth daily. Alba Cory, MD Taking Active   fluticasone [facility]) 50 MCG/ACT nasal spray [id number] Yes 2 sprays each nostril Daily. DISP#  1 bottles = 1 month supply. [provider] Taking Active   fluticasone furoate-vilanterol (BREO ELLIPTA) 100-25 MCG/ACT AEPB [id number] Yes Inhale 1 puff into the lungs daily. [provider] Taking Active   gabapentin (NEURONTIN) 300 MG capsule [id number] Yes Take 1 capsule (300 mg total) by mouth 3 (three) times daily. Alba Cory, MD Taking Active   glucose blood test strip [id number] Yes Use as instructed Alba Cory, MD Taking Active Self  insulin NPH Human (NOVOLIN N) 100 UNIT/ML injection [id number] Yes Inject into the skin. [provider] Taking Active            Med Note Valentina Lucks, RAVEN M   Mon Mar 20, 2024  9:19 AM) 5U in AM + 5U in PM (if BG>250)  ipratropium-albuterol (DUONEB) 0.5-2.5 (3) MG/3ML SOLN  [id number] Yes Take 3 mLs by nebulization every 6 (six) hours as needed. Berniece Salines, FNP Taking Active     Discontinued 01/29/20 1715   loratadine (CLARITIN) 10 MG tablet [id number] Yes Take 1 tablet (10 mg total) by mouth daily. Alba Cory, MD Taking Active   magnesium oxide (MAG-OX) 400 MG tablet [id number] Yes Take 1 tablet (400 mg total) by mouth 2 (two) times daily. Alba Cory, MD Taking Active   metoprolol succinate (TOPROL-XL) 25 MG 24 hr tablet [id number] Yes Take 0.5 tablets (12.5 mg total) by mouth daily. Takes 1/2 tablet Alba Cory, MD Taking Active   Microlet Lancets MISC [id number] Yes USE TO CHECK BLOOD SUGAR ONCE D [provider] Taking Active Self  montelukast (SINGULAIR) 10 MG tablet [id number] Yes Take by mouth. [provider] Taking Active   nystatin (MYCOSTATIN) 100000 UNIT/ML suspension [id number] Yes Take 5 mLs (500,000 Units total) by mouth 4 (four) times daily. Alba Cory, MD Taking Active   ondansetron (ZOFRAN-ODT) 4 MG disintegrating tablet [id number] Yes Take 1 tablet (4 mg total) by mouth every 8 (eight) hours as needed for nausea. Berniece Salines, FNP Taking Active   pantoprazole (PROTONIX) 40 MG tablet [id number] Yes Take 1 tablet (40 mg total) by mouth 2 (two) times daily. Alba Cory, MD Taking Active   predniSONE (DELTASONE) 10 MG tablet [id number] Yes Take 10 mg by mouth daily with breakfast. 60 mg daily, plan for 7-days then taper by 10mg  every 7 days. [provider] Taking Active            Med Note Valentina Lucks, RAVEN M   Mon Mar 20, 2024  9:20 AM) 5mg  daily for 7 days (starting 3/25)  promethazine (PHENERGAN) 25 MG tablet [id number] Yes Take 1 tablet (25 mg total) by mouth every 8 (eight) hours as needed for nausea or vomiting.  Patient taking differently: Take 25 mg by mouth every 8 (eight) hours as needed for nausea or vomiting (PRN).   Berniece Salines, FNP Taking Active   rosuvastatin (CRESTOR) 40 MG tablet [id number]  Yes Take 1 tablet (40 mg total) by mouth daily. Alba Cory, MD Taking Active   Semaglutide,0.25 or 0.5MG /DOS, (OZEMPIC, 0.25 OR 0.5 MG/DOSE,) 2 MG/3ML SOPN [id number] Yes Inject 0.5 mg into the skin once a week. Alba Cory, MD Taking Active   sodium chloride 0.9 % nebulizer solution [id number] Yes Inhale into the lungs. [provider] Taking Active   Spacer/Aero-Holding [address] DIAMOND) MISC [id number] Yes  [provider] Taking Active  Ubrogepant (UBRELVY) 100 MG TABS [id number] Yes Take 1 tablet (100 mg total) by mouth daily as needed. Alba Cory, MD Taking Active             Recommendation:    PCP Follow-up as scheduled  Specialty provider follow-up as scheduled  Cardiology 4.12 GI 4/14 and Orthopedics 4/17  DME requests:  none Home Health requests: Occupational Therapy Skilled Nursing with Rolene Arbour [phone]  Follow Up Plan:   Telephone follow-up in 1 week4/16/25 @ 11:00am   Susa Loffler , BSN, RN [facility]   VBCI-Population Health RN Care Manager Direct Dial [phone]  Fax: [phone] Website: [email or web address]

## 2024-04-05 NOTE — Patient Instructions (Signed)
 Visit Information  Thank you for taking time to visit with me today. Please don't hesitate to contact me if I can be of assistance to you before our next scheduled telephone appointment.  Our next appointment is by telephone on 04/12/24  at 11:00am   Following is a copy of your care plan:   Goals Addressed             This Visit's Progress    TOC Care Plan       Current Barriers:  Medication management multiple changes  Provider appointments PCP Pulmonology Cardiology-GI   Date of PCP follow-up appointment?: 03/17/24 Follow-up Provider: Alba Cory, MD Specialist Hospital Follow-up appointment confirmed?: Yes Date of Specialist follow-up appointment?: 04/07/24 Follow-Up Specialty Provider:: [facility] CARDIOLOGY [place] Apr 07, 2024 4:20 PM-   [facility] GI MEDICINE [place] (Apr 10, 2024 12:30 PM   [facility] [place] Apr 13, 2024 3:30 PM  MRI Cardiac Morphology With and Without Contrast with [facility] Apr 25, 2024 9:30 AM Transportation Uses Benefits, [facility] Public and Family Friends  Chronic Disease Management support and education needs related to DMII and Pulmonary Disease   RNCM Clinical Goal(s):  Patient will work with the Care Management team over the next 30 days to address Transition of Care Barriers: Medication Management Support at home Provider appointments take all medications exactly as prescribed and will call provider for medication related questions as evidenced by no missed medication doses attend all scheduled medical appointments: as evidenced by no missed follow-up visits  through collaboration with RN Care manager, provider, and care team.   Interventions: Evaluation of current treatment plan related to  self management and patient's adherence to plan as established by provider  Transitions of Care:  Goal on track:  Yes. Doctor Visits  - discussed the importance of doctor visits [facility] RN/OT/PT  Post discharge activity  limitations prescribed by provider reviewed Reviewed Signs and symptoms of infection  Asthma: (Status:Goal on track:  Yes.) Short Term Goal Provided instruction about proper use of medications used for management of Asthma including inhalers Provided education about and advised patient to utilize infection prevention strategies to reduce risk of respiratory infection Discussed the importance of adequate rest and management of fatigue with Asthma Screening for signs and symptoms of depression related to chronic disease state  Assessed social determinant of health barriers   Patient Goals/Self-Care Activities: Participate in Transition of Care Program/Attend TOC scheduled calls Take all medications as prescribed Attend all scheduled provider appointments Call pharmacy for medication refills 3-7 days in advance of running out of medications Perform all self care activities independently  Perform IADL's (shopping, preparing meals, housekeeping, managing finances) independently Call provider office for new concerns or questions   Follow Up Plan:  Telephone follow up appointment with care management team member scheduled for:  04/12/24 @ 11:00am   The patient has been provided with contact information for the care management team and has been advised to call with any health related questions or concerns.         Medication review  Reviewed current home medications -- provided education as needed. Patient is aware of potential side effects and was encouraged to notify PCP for any adverse side effects or unwanted symptoms not relieved with interventions    Reviewed goals for care Patient/ Caregiver verbalizes understanding of instructions with the plan of care . The  Patient / Caregiver was encouraged to make informed decisions about care, actively participate in managing  health conditions, and implement lifestyle changes as needed to promote independence and self-management of healthcare.  SDOH screenings have been completed and addressed if indicted.  There are no reported barriers to care.    Follow-up Plan 1.If not already completed- Please call scheduled a post hospital Follow up appointment  with your  PCP within in 1-2 weeks of your hospital discharge date -when scheduling please ask your Primary MD to get all Hospital records sent to his/her office. Take all your medications and your discharge paperwork with you for your next visit with your Primary MD-   Be sure to request your Primary MD to go over all hospital tests and procedure/radiological results at the follow up appointment ,   VBCI Case Management Nurse will provide follow-up and on-going assessment ,evaluation and education of disease processes, recommended interventions for both chronic and acute medical conditions ,  along with ongoing review of symptoms ,medication reviews / reconciliation during each weekly call . Any updates , inconsistencies, discrepancies or acute care concerns will be addressed and routed to the correct Practitioner if indicated     [facility]  Please call the care guide team at [phone]  if you need to cancel or reschedule your appointment . For scheduled calls -Three attempts will be made to reach you -if the scheduled call is missed or  we are unable to reach the you after 3 attempts no additional outreach attempts will be made and the TOC follow-up will be closed .   If you need to speak to a Nurse you may  call me directly at the number below or if I am unavailable,and  your need is urgent  please call the main [facility]I number at [phone] and ask to speak with one of the [facility] ( Transition of Care )  Nurses  .  Patient was encouraged to Contact PCP with any changes in baseline or  medication regimen,  changes in health status  /  well-being, safety concerns, including falls any questions or concerns regarding ongoing medical care, any difficulty obtaining or picking up  prescriptions, any changes or worsening in condition- including  symptoms not relieved  with interventions                                                                            Additionally, If you experience worsening of your symptoms, develop shortness of breath, If you are experiencing a medical emergency,  develop suicidal or homicidal thoughts you must seek medical attention immediately by calling 911 or report to your local emergency department or urgent care.   If you have a non-emergency medical problem during routine business hours, please contact your provider's office and ask to speak with a nurse.       Please take the time to read instructions/literature along with the possible adverse reactions/side effects for all the Medicines that have been prescribed to you. Only take newly prescribed  Medications after you have completely understood and accept all the possible adverse reactions/side effects.   Do not take more than prescribed Medications for  Pain, Sleep and Anxiety. Do not drive when taking Pain medications or sleep aid/ insomnia  medications It is not advisable  to combine anxiety, sleep and pain medications without talking with your primary care practitioner    If you are experiencing a Mental Health or Behavioral Health Crisis or need someone to talk to Please call the Suicide and Crisis Lifeline: 988 You may also call the [place] National Suicide Prevention Lifeline: 331-179-2733 or TTY: (754)716-5835 TTY 567-250-4191) to talk to a trained counselor.  You may call the Behavioral Health Crisis Line at 949-686-6050, at any time, 24 hours a day, 7 days a week- however If you are in danger or need immediate medical attention, call 911.   If you would like help to quit smoking, call 1-800-QUIT-NOW ( 780-770-7288) OR Espaol: 1-855-Djelo-Ya (7-253-664-4034) o para ms informacin haga clic aqu or Text READY to 742-595 to register via text.   The patient has been  provided with contact information for the care management team and has been advised to call with any health-related questions or concerns. Follow up call  with Care Team  as scheduled,or sooner should any new problems arise.  Susa Loffler , BSN, RN [place]   [facility]BCI-Population Health RN Care Manager Direct Dial [phone]  Website: [email or web address]

## 2024-04-07 DIAGNOSIS — N183 Chronic kidney disease, stage 3 unspecified: Secondary | ICD-10-CM | POA: Diagnosis not present

## 2024-04-07 DIAGNOSIS — G4700 Insomnia, unspecified: Secondary | ICD-10-CM | POA: Diagnosis not present

## 2024-04-07 DIAGNOSIS — E1165 Type 2 diabetes mellitus with hyperglycemia: Secondary | ICD-10-CM | POA: Diagnosis not present

## 2024-04-07 DIAGNOSIS — R519 Headache, unspecified: Secondary | ICD-10-CM | POA: Diagnosis not present

## 2024-04-07 DIAGNOSIS — I422 Other hypertrophic cardiomyopathy: Secondary | ICD-10-CM | POA: Diagnosis not present

## 2024-04-07 DIAGNOSIS — J4551 Severe persistent asthma with (acute) exacerbation: Secondary | ICD-10-CM | POA: Diagnosis not present

## 2024-04-07 DIAGNOSIS — E1142 Type 2 diabetes mellitus with diabetic polyneuropathy: Secondary | ICD-10-CM | POA: Diagnosis not present

## 2024-04-07 DIAGNOSIS — E1122 Type 2 diabetes mellitus with diabetic chronic kidney disease: Secondary | ICD-10-CM | POA: Diagnosis not present

## 2024-04-07 DIAGNOSIS — I129 Hypertensive chronic kidney disease with stage 1 through stage 4 chronic kidney disease, or unspecified chronic kidney disease: Secondary | ICD-10-CM | POA: Diagnosis not present

## 2024-04-07 DIAGNOSIS — I421 Obstructive hypertrophic cardiomyopathy: Secondary | ICD-10-CM | POA: Diagnosis not present

## 2024-04-07 DIAGNOSIS — G629 Polyneuropathy, unspecified: Secondary | ICD-10-CM | POA: Diagnosis not present

## 2024-04-07 DIAGNOSIS — J45909 Unspecified asthma, uncomplicated: Secondary | ICD-10-CM | POA: Diagnosis not present

## 2024-04-07 DIAGNOSIS — R809 Proteinuria, unspecified: Secondary | ICD-10-CM | POA: Diagnosis not present

## 2024-04-07 DIAGNOSIS — E1129 Type 2 diabetes mellitus with other diabetic kidney complication: Secondary | ICD-10-CM | POA: Diagnosis not present

## 2024-04-07 DIAGNOSIS — T380X5D Adverse effect of glucocorticoids and synthetic analogues, subsequent encounter: Secondary | ICD-10-CM | POA: Diagnosis not present

## 2024-04-07 DIAGNOSIS — J45901 Unspecified asthma with (acute) exacerbation: Secondary | ICD-10-CM | POA: Diagnosis not present

## 2024-04-07 DIAGNOSIS — I10 Essential (primary) hypertension: Secondary | ICD-10-CM | POA: Diagnosis not present

## 2024-04-07 DIAGNOSIS — J8283 Eosinophilic asthma: Secondary | ICD-10-CM | POA: Diagnosis not present

## 2024-04-07 DIAGNOSIS — Z794 Long term (current) use of insulin: Secondary | ICD-10-CM | POA: Diagnosis not present

## 2024-04-10 DIAGNOSIS — K219 Gastro-esophageal reflux disease without esophagitis: Secondary | ICD-10-CM | POA: Diagnosis not present

## 2024-04-10 DIAGNOSIS — R1319 Other dysphagia: Secondary | ICD-10-CM | POA: Diagnosis not present

## 2024-04-11 DIAGNOSIS — I129 Hypertensive chronic kidney disease with stage 1 through stage 4 chronic kidney disease, or unspecified chronic kidney disease: Secondary | ICD-10-CM | POA: Diagnosis not present

## 2024-04-11 DIAGNOSIS — E1165 Type 2 diabetes mellitus with hyperglycemia: Secondary | ICD-10-CM | POA: Diagnosis not present

## 2024-04-11 DIAGNOSIS — E1122 Type 2 diabetes mellitus with diabetic chronic kidney disease: Secondary | ICD-10-CM | POA: Diagnosis not present

## 2024-04-11 DIAGNOSIS — I421 Obstructive hypertrophic cardiomyopathy: Secondary | ICD-10-CM | POA: Diagnosis not present

## 2024-04-11 DIAGNOSIS — J45901 Unspecified asthma with (acute) exacerbation: Secondary | ICD-10-CM | POA: Diagnosis not present

## 2024-04-11 DIAGNOSIS — T380X5D Adverse effect of glucocorticoids and synthetic analogues, subsequent encounter: Secondary | ICD-10-CM | POA: Diagnosis not present

## 2024-04-11 DIAGNOSIS — E1142 Type 2 diabetes mellitus with diabetic polyneuropathy: Secondary | ICD-10-CM | POA: Diagnosis not present

## 2024-04-11 DIAGNOSIS — N183 Chronic kidney disease, stage 3 unspecified: Secondary | ICD-10-CM | POA: Diagnosis not present

## 2024-04-11 DIAGNOSIS — J8283 Eosinophilic asthma: Secondary | ICD-10-CM | POA: Diagnosis not present

## 2024-04-12 ENCOUNTER — Other Ambulatory Visit: Payer: Self-pay

## 2024-04-12 NOTE — Transitions of Care (Post Inpatient/ED Visit) (Signed)
   04/12/2024  Name: Brandy Johnston MRN: [id number] DOB: 1961-07-29  Today's TOC FU Call Status:    Attempted to reach the patient regarding the most recent Inpatient/ED visit. Patient  Outreach attempt is in the course of  [facility]  30-day TOC program. Pt previously agreed and is enrolled in the  program due to potential risk for readmission and/or high utilization. Unfortunately, I was not able to speak with the patient in regards to recent hospital discharge     Patient's voicemail has  a generic greeting. To maintain HIPAA compliance, left message including only VBCI CM contact information and a request for a call back .    Follow Up Plan: Additional outreach attempts will be made to reach the patient to complete the Transitions of Care (Post Inpatient/ED visit) call.   James Mcardle , BSN, RN [facility]   VBCI-Population Health RN Care Manager Direct Dial [phone]  Fax: [phone] Website: [email or web address]

## 2024-04-12 NOTE — Patient Instructions (Signed)
 Visit Information  Thank you for taking time to visit with me today. Please don't hesitate to contact me if I can be of assistance to you .   Following is a copy of your care plan:   Goals Addressed             This Visit's Progress    COMPLETED: TOC Care Plan       Current Barriers:  Medication management multiple changes  Provider appointments PCP Pulmonology Cardiology-GI   Date of PCP follow-up appointment?: 03/17/24 Follow-up Provider: Alba Cory, MD Specialist Hospital Follow-up appointment confirmed?: Yes Date of Specialist follow-up appointment?: 04/07/24 Follow-Up Specialty Provider:: [facility] CARDIOLOGY [place] Apr 07, 2024 4:20 PM-   [facility] GI MEDICINE [place] (Apr 10, 2024 12:30 PM   [facility] [place] Apr 13, 2024 3:30 PM  MRI Cardiac Morphology With and Without Contrast with [facility] Apr 25, 2024 9:30 AM Transportation Uses Benefits, [facility] Public and Family Friends  Chronic Disease Management support and education needs related to DMII and Pulmonary Disease   RNCM Clinical Goal(s):  Patient will work with the Care Management team over the next 30 days to address Transition of Care Barriers: Medication Management Support at home Provider appointments take all medications exactly as prescribed and will call provider for medication related questions as evidenced by no missed medication doses attend all scheduled medical appointments: as evidenced by no missed follow-up visits  through collaboration with RN Care manager, provider, and care team.   Interventions: Evaluation of current treatment plan related to  self management and patient's adherence to plan as established by provider  Transitions of Care:  Goal on track:  Yes. Doctor Visits  - discussed the importance of doctor visits [facility] RN/OT/PT  Post discharge activity limitations prescribed by provider reviewed Reviewed Signs and symptoms of infection  Asthma:  (Status:Goal on track:  Yes.) Short Term Goal Provided instruction about proper use of medications used for management of Asthma including inhalers Provided education about and advised patient to utilize infection prevention strategies to reduce risk of respiratory infection Discussed the importance of adequate rest and management of fatigue with Asthma Screening for signs and symptoms of depression related to chronic disease state  Assessed social determinant of health barriers   Patient Goals/Self-Care Activities: Participate in Transition of Care Program/Attend TOC scheduled calls Take all medications as prescribed Attend all scheduled provider appointments Call pharmacy for medication refills 3-7 days in advance of running out of medications Perform all self care activities independently  Perform IADL's (shopping, preparing meals, housekeeping, managing finances) independently Call provider office for new concerns or questions   Follow Up Plan:  The patient has successfully completed the 30-day TOC Program. Condition is stable No further acute needs identified at this time. Chronic conditions and ongoing care is  managed thru collaboration with  PCP,  Specialists and additional Healthcare Providers if indicated . Patient verbalized understanding of ongoing plan of care.  Continue [facility] and SN services Follow-up on need fr sleep study Endoscopy 5/28, follow recommendations for meals and swallowing  Cardiology follow-up as indicted if SOB continues or worsens  Take Asthma Medications as directed report unrelieved symptoms or worsening SOB   SDOH needs have been screened and interventions provided if identified.   Reviewed current home medications -- provided education as needed.  Previously discussed rationale of use, how/when to take medications. Patient is aware of potential side effects, and was encouraged to notify PCP for  any changes in condition or signs / symptoms not  relieved  with interventions.   Patient will call 911 for Medical Emergencies or Life -Threatening or report to a local emergency department or urgent care.   Patient was encouraged to Contact PCP  with any questions or concerns regarding ongoing  medical care, any  difficulty obtaining or picking up  prescriptions, any  changes or  worsening in  condition including signs / symptoms not relieved  with interventions Follow up as indicated with the  Care Team , or sooner should any new problems arise.     Patient had no additional questions or concerns at this time. Current needs addressed.     The patient has been provided with contact information for the care management team and has been advised to call with any health related questions or concerns.         Medication review  Reviewed current home medications -- provided education as needed. Patient is aware of potential side effects and was encouraged to notify PCP for any adverse side effects or unwanted symptoms not relieved with interventions    Reviewed goals for care Patient/ Caregiver verbalizes understanding of instructions with the plan of care . The  Patient / Caregiver was encouraged to make informed decisions about care, actively participate in managing health conditions, and implement lifestyle changes as needed to promote independence and self-management of healthcare. SDOH screenings have been completed and addressed if indicted.  There are no reported barriers to care.    Follow-up Plan 1.If not already completed- Please call scheduled a post hospital Follow up appointment  with your  PCP within in 1-2 weeks of your hospital discharge date -when scheduling please ask your Primary MD to get all Hospital records sent to his/her office. Take all your medications and your discharge paperwork with you for your next visit with your Primary MD-   Be sure to request your Primary MD to go over all hospital tests and  procedure/radiological results at the follow up appointment ,   [facility] Case Management Nurse will provide follow-up and on-going assessment ,evaluation and education of disease processes, recommended interventions for both chronic and acute medical conditions ,  along with ongoing review of symptoms ,medication reviews / reconciliation during each weekly call . Any updates , inconsistencies, discrepancies or acute care concerns will be addressed and routed to the correct Practitioner if indicated     [facility]  Please call the care guide team at [phone]  if you need to cancel or reschedule your appointment . For scheduled calls -Three attempts will be made to reach you -if the scheduled call is missed or  we are unable to reach the you after 3 attempts no additional outreach attempts will be made and the TOC follow-up will be closed .   If you need to speak to a Nurse you may  call me directly at the number below or if I am unavailable,and  your need is urgent  please call the main [facility] number at [phone] and ask to speak with one of the [facility] ( Transition of Care )  Nurses  .  Patient was encouraged to Contact PCP with any changes in baseline or  medication regimen,  changes in health status  /  well-being, safety concerns, including falls any questions or concerns regarding ongoing medical care, any difficulty obtaining or picking up prescriptions, any changes or worsening in condition- including  symptoms not relieved  with  interventions                                                                            Additionally, If you experience worsening of your symptoms, develop shortness of breath, If you are experiencing a medical emergency,  develop suicidal or homicidal thoughts you must seek medical attention immediately by calling 911 or report to your local emergency department or urgent care.   If you have a non-emergency medical problem during routine business hours,  please contact your provider's office and ask to speak with a nurse.       Please take the time to read instructions/literature along with the possible adverse reactions/side effects for all the Medicines that have been prescribed to you. Only take newly prescribed  Medications after you have completely understood and accept all the possible adverse reactions/side effects.   Do not take more than prescribed Medications for  Pain, Sleep and Anxiety. Do not drive when taking Pain medications or sleep aid/ insomnia  medications It is not advisable to combine anxiety, sleep and pain medications without talking with your primary care practitioner    If you are experiencing a Mental Health or Behavioral Health Crisis or need someone to talk to Please call the Suicide and Crisis Lifeline: 60 You may also call the [place]  National Suicide Prevention Lifeline: 202-484-9839 or TTY: 713-279-9197 TTY 936 099 8254) to talk to a trained counselor.  You may call the Behavioral Health Crisis Line at (573)291-3071, at any time, 24 hours a day, 7 days a week- however If you are in danger or need immediate medical attention, call 911.   If you would like help to quit smoking, call 1-800-QUIT-NOW ( 573-251-3144) OR Espaol: 1-855-Djelo-Ya (5-638-756-4332) o para ms informacin haga clic aqu or Text READY to 951-884 to register via text.   The patient has been provided with contact information for the care management team and has been advised to call with any health-related questions or concerns. Follow up call  with Care Team  as scheduled,or sooner should any new problems arise.  James Mcardle , BSN, RN [place]   [facility]-Population Health RN Care Manager Direct Dial [phone]  Website: [email or web address]

## 2024-04-12 NOTE — Transitions of Care (Post Inpatient/ED Visit) (Signed)
 Transition of Care  Week 5 Program closure   Visit Note  04/12/2024  Name: Brandy Johnston MRN: [id number]          DOB: [birth year]  Situation: Patient enrolled in [facility] 30-day program. Visit completed with patient  by telephone.   Background:   Initial Transition Care Management Follow-up Telephone Call    Past Medical History:  Diagnosis Date   Anemia    Chronic renal impairment, stage 3 (moderate) (HCC)    Diabetes mellitus without complication (HCC)    No longer on meds   GERD (gastroesophageal reflux disease)    Hyperlipidemia    Hypertension    Hypokalemia    Insomnia    Low calcium levels    Osteoarthrosis, hip    right hip   Paresthesia    Sickle cell anemia (HCC)    Sickle-cell trait (HCC)    Tension headache    1x/mo   Wears contact lenses     Assessment: Patient Reported Symptoms: Cognitive Cognitive Status: Insightful and able to interpret abstract concepts, Normal speech and language skills   Health Maintenance Behaviors: Annual physical exam, Exercise, Healthy diet, Immunizations, Stress management Healing Pattern: Average Health Facilitated by: Healthy diet, Pain control, Prayer/meditation, Rest, Stress management  Neurological   Neurological Management Strategies: Activity, Adequate rest, Coping strategies, Counseling, Diet modification, Exercise, Medication therapy, Routine screening Neurological Self-Management Outcome: 3 (uncertain)  HEENT HEENT Symptoms Reported: No symptoms reported Other HEENT Symptoms/Conditions: Trush resolved HEENT Management Strategies: Medication therapy    Cardiovascular Cardiovascular Symptoms Reported: Fatigue, Swelling in legs or feet, Dizziness Does patient have uncontrolled Hypertension?: No Cardiovascular Conditions: Coronary artery disease, Hypertension, High blood cholesterol Cardiovascular Management Strategies: Activity, Adequate rest, Coping strategies, Diet modification, Exercise, Medication therapy, Routine  screening, Weight management Do You Have a Working Readable Scale?: Yes Weight: 159 lb (72.1 kg) Cardiovascular Self-Management Outcome: 4 (good)  Respiratory Respiratory Symptoms Reported: Shortness of breath, Wheezing Additional Respiratory Details: Nebs/ Inhalers On multipe Asthma Medications Respiratory Conditions: Asthma Respiratory Self-Management Outcome: 3 (uncertain)  Endocrine Patient reports the following symptoms related to hypoglycemia or hyperglycemia : Headaches, Irritability, Nausea or vomiting, Shortness of breath, Weakness or fatigue Is patient diabetic?: Yes Is patient checking blood sugars at home?: Yes Endocrine Conditions: Diabetes Endocrine Management Strategies: Activity, Adequate rest, Medication therapy, Routine screening, Weight management, Coping strategies, Diet modification, Exercise Endocrine Self-Management Outcome: 4 (good)  Gastrointestinal Gastrointestinal Symptoms Reported: Change in appetite, Nausea Additional Gastrointestinal Details: difficukty swallowing Gastrointestinal Management Strategies: Adequate rest, Diet modification, Medication therapy Nutrition Risk Screen (CP): Reduced oral intake over the last month  Genitourinary      Integumentary Integumentary Symptoms Reported: No symptoms reported    Musculoskeletal Musculoskelatal Symptoms Reviewed: Unsteady gait, Weakness Musculoskeletal Conditions: Mobility limited, Joint pain, Unsteady gait Musculoskeletal Management Strategies: Adequate rest, Coping strategies, Diet modification, Weight management, Medication therapy, Medical device Musculoskeletal Self-Management Outcome: 4 (good) Falls in the past year?: No Number of falls in past year: 1 or less Patient at Risk for Falls Due to: Impaired mobility, Impaired balance/gait, Medication side effect Fall risk Follow up: Falls prevention discussed  Psychosocial Psychosocial Symptoms Reported: Alteration in eating habits Behavioral Management  Strategies: Adequate rest, Counseling, Coping strategies, Support system, Exercise, Medication therapy Behavioral Health Self-Management Outcome: 4 (good) Major Change/Loss/Stressor/Fears (CP): Medical condition, self Techniques to Cope with Loss/Stress/Change: Diversional activities, Exercise, Medication Quality of Family Relationships: helpful, involved, supportive Do you feel physically threatened by others?: No   Vitals:   04/12/24 1208  BP:  120/60    Medications Reviewed Today     Reviewed by Johnnette Barrios, RN (Registered Nurse) on 04/12/24 at 1155  Med List Status: <None>   Medication Order Taking? Sig Documenting Provider Last Dose Status Informant  albuterol (VENTOLIN HFA) 108 (90 Base) MCG/ACT inhaler [id number] Yes SMARTSIG:2 Puff(s) By Mouth Every 4-6 Hours PRN [provider] Taking Active   aspirin (ASPIRIN 81) 81 MG chewable tablet [id number] Yes Chew 1 tablet (81 mg total) by mouth daily. Alba Cory, MD Taking Active Self  Atogepant (QULIPTA) 30 MG TABS [id number] Yes Take 1 tablet (30 mg total) by mouth daily at 12 noon. Alba Cory, MD Taking Active   benralizumab [facility] PEN) 30 MG/ML prefilled autoinjector [id number] Yes Inject into the skin. [provider] Taking Active            Med Note (Tristram Milian L   Wed Apr 12, 2024 11:53 AM) Last 3/17, due this week   Blood Glucose Monitoring Suppl (CONTOUR NEXT ONE) KIT [id number] Yes USE TO TEST BLOOD SUGAR ONCE D [provider] Taking Active Self  Budeson-Glycopyrrol-Formoterol (BREZTRI AEROSPHERE) 160-9-4.8 MCG/ACT AERO [id number] Yes Inhale 2 puffs into the lungs in the morning and at bedtime. [provider] Taking Active   Cholecalciferol (VITAMIN D) 2000 units CAPS [id number] Yes Take 1 capsule (2,000 Units total) by mouth daily. Alba Cory, MD Taking Active Self  Continuous Glucose Receiver (FREESTYLE LIBRE 3 READER) DEVI [id number] Yes 1 each by Does not apply route as  directed. Alba Cory, MD Taking Active   Continuous Glucose Sensor (FREESTYLE LIBRE 3 SENSOR) [place] [id number] Yes 1 Device by Does not apply route every 14 (fourteen) days. Alba Cory, MD Taking Active   cyclobenzaprine (FLEXERIL) 5 MG tablet [id number] Yes Take 1 tablet (5 mg total) by mouth at bedtime. Alba Cory, MD Taking Active   diclofenac Sodium (VOLTAREN) 1 % GEL [id number] Yes Apply 2 g topically 4 (four) times daily. Alba Cory, MD Taking Active   diltiazem (CARDIZEM CD) 180 MG 24 hr capsule [id number] Yes Take 1 capsule (180 mg total) by mouth daily. Alba Cory, MD Taking Active            Med Note Sharon Seller, Lashonta Pilling L   Wed Apr 12, 2024 11:54 AM) Per patient increased to 240 mg by Cardiology 04/07/24   empagliflozin (JARDIANCE) 25 MG TABS tablet [id number] Yes Take 1 tablet (25 mg total) by mouth daily before breakfast. Alba Cory, MD Taking Active   EPINEPHrine 0.3 mg/0.3 mL IJ SOAJ injection [id number] Yes Inject into the muscle. [provider] Taking Active   ezetimibe (ZETIA) 10 MG tablet [id number] Yes Take 1 tablet (10 mg total) by mouth daily. Alba Cory, MD Taking Active   fluticasone [facility]) 50 MCG/ACT nasal spray [id number] Yes 2 sprays each nostril Daily. DISP#  1 bottles = 1 month supply. [provider] Taking Active   fluticasone furoate-vilanterol (BREO ELLIPTA) 100-25 MCG/ACT AEPB [id number] Yes Inhale 1 puff into the lungs daily. [provider] Taking Active   gabapentin (NEURONTIN) 300 MG capsule [id number] Yes Take 1 capsule (300 mg total) by mouth 3 (three) times daily. Alba Cory, MD Taking Active   glucose blood test strip [id number] Yes Use as instructed Alba Cory, MD Taking Active Self  insulin NPH Human (NOVOLIN N) 100 UNIT/ML injection [id number] Yes Inject into the skin. [provider] Taking Active            Med Note (GRIFFIN, RAVEN M  Mon Mar 20, 2024  9:19 AM) 5U in AM + 5U in PM (if  BG>250)  ipratropium-albuterol (DUONEB) 0.5-2.5 (3) MG/3ML SOLN [id number] Yes Take 3 mLs by nebulization every 6 (six) hours as needed. Berniece Salines, FNP Taking Active     Discontinued 01/29/20 1715   loratadine (CLARITIN) 10 MG tablet [id number] Yes Take 1 tablet (10 mg total) by mouth daily. Alba Cory, MD Taking Active   magnesium oxide (MAG-OX) 400 MG tablet [id number] Yes Take 1 tablet (400 mg total) by mouth 2 (two) times daily. Alba Cory, MD Taking Active   metoprolol succinate (TOPROL-XL) 25 MG 24 hr tablet [id number] Yes Take 0.5 tablets (12.5 mg total) by mouth daily. Takes 1/2 tablet Alba Cory, MD Taking Active   Microlet Lancets MISC [id number] Yes USE TO CHECK BLOOD SUGAR ONCE D [provider] Taking Active Self  montelukast (SINGULAIR) 10 MG tablet [id number] Yes Take by mouth. [provider] Taking Active   nystatin (MYCOSTATIN) 100000 UNIT/ML suspension [id number] No Take 5 mLs (500,000 Units total) by mouth 4 (four) times daily.  Patient not taking: Reported on 04/12/2024   Alba Cory, MD Not Taking Active            Med Note (Orval Dortch L   Wed Apr 12, 2024 11:54 AM) [name] resolved   ondansetron (ZOFRAN-ODT) 4 MG disintegrating tablet [id number] Yes Take 1 tablet (4 mg total) by mouth every 8 (eight) hours as needed for nausea. Berniece Salines, FNP Taking Active   pantoprazole (PROTONIX) 40 MG tablet [id number] Yes Take 1 tablet (40 mg total) by mouth 2 (two) times daily. Alba Cory, MD Taking Active   predniSONE (DELTASONE) 10 MG tablet [id number] No Take 10 mg by mouth daily with breakfast. 60 mg daily, plan for 7-days then taper by 10mg  every 7 days.  Patient not taking: Reported on 04/12/2024   [provider] Not Taking Active            Med Note (Crosby Bevan L   Wed Apr 12, 2024 11:55 AM) Completed   promethazine (PHENERGAN) 25 MG tablet [id number] Yes Take 1 tablet (25 mg total) by mouth every 8 (eight) hours as  needed for nausea or vomiting.  Patient taking differently: Take 25 mg by mouth every 8 (eight) hours as needed for nausea or vomiting (PRN).   Berniece Salines, FNP Taking Active            Med Note (Cherilyn Sautter L   Wed Apr 12, 2024 11:55 AM) Taking PRN  rosuvastatin (CRESTOR) 40 MG tablet [id number] Yes Take 1 tablet (40 mg total) by mouth daily. Alba Cory, MD Taking Active   Semaglutide,0.25 or 0.5MG /DOS, (OZEMPIC, 0.25 OR 0.5 MG/DOSE,) 2 MG/3ML SOPN [id number] Yes Inject 0.5 mg into the skin once a week. Alba Cory, MD Taking Active   sodium chloride 0.9 % nebulizer solution [id number] Yes Inhale into the lungs. [provider] Taking Active   Spacer/Aero-Holding Deretha [facility] DIAMOND) MISC [id number] Yes  [provider] Taking Active   Ubrogepant (UBRELVY) 100 MG TABS [id number] Yes Take 1 tablet (100 mg total) by mouth daily as needed. Alba Cory, MD Taking Active             Recommendation:   PCP Follow-up Specialty provider follow-up Cardiology, GI and Neurology  Home Health requests: Rolene Arbour [phone] Occupational Therapy Physical therapy Skilled Nursing  Follow Up Plan:   Closing From:  [facility] Program Follow Up Plan:  The patient  has successfully completed the 30-day TOC Program. Condition is stable No further acute needs identified at this time. Chronic conditions and ongoing care is  managed thru collaboration with  PCP,  Specialists and additional Healthcare Providers if indicated . Patient verbalized understanding of ongoing plan of care.  Continue [facility] and SN services Follow-up on need for sleep study Endoscopy 5/28, follow recommendations for meals and swallowing  Cardiology follow-up as indicted if SOB continues or worsens  Take Asthma Medications as directed report unrelieved symptoms or worsening SOB   SDOH needs have been screened and interventions provided if identified.   Reviewed current home medications  -- provided education as needed.  Previously discussed rationale of use, how/when to take medications. Patient is aware of potential side effects, and was encouraged to notify PCP for any changes in condition or signs / symptoms not relieved  with interventions.   Patient will call 911 for Medical Emergencies or Life -Threatening or report to a local emergency department or urgent care.   Patient was encouraged to Contact PCP  with any questions or concerns regarding ongoing  medical care, any  difficulty obtaining or picking up  prescriptions, any  changes or  worsening in  condition including signs / symptoms not relieved  with interventions Follow up as indicated with the  Care Team , or sooner should any new problems arise.     Patient had no additional questions or concerns at this time. Current needs addressed  James Mcardle , BSN, RN [facility]   [facility]-Population Health RN Care Manager Direct Dial [phone]  Fax: [phone] Website: [email or web address]

## 2024-04-13 DIAGNOSIS — H52223 Regular astigmatism, bilateral: Secondary | ICD-10-CM | POA: Diagnosis not present

## 2024-04-13 DIAGNOSIS — H524 Presbyopia: Secondary | ICD-10-CM | POA: Diagnosis not present

## 2024-04-13 DIAGNOSIS — M19012 Primary osteoarthritis, left shoulder: Secondary | ICD-10-CM | POA: Diagnosis not present

## 2024-04-14 ENCOUNTER — Ambulatory Visit
Admission: RE | Admit: 2024-04-14 | Discharge: 2024-04-14 | Disposition: A | Discharge status: Home / Self Care | Source: Ambulatory Visit | Attending: Family Medicine | Admitting: Family Medicine

## 2024-04-14 DIAGNOSIS — Z1231 Encounter for screening mammogram for malignant neoplasm of breast: Secondary | ICD-10-CM | POA: Diagnosis not present

## 2024-04-25 DIAGNOSIS — R9439 Abnormal result of other cardiovascular function study: Secondary | ICD-10-CM | POA: Diagnosis not present

## 2024-04-25 DIAGNOSIS — I422 Other hypertrophic cardiomyopathy: Secondary | ICD-10-CM | POA: Diagnosis not present

## 2024-04-25 DIAGNOSIS — R0609 Other forms of dyspnea: Secondary | ICD-10-CM | POA: Diagnosis not present

## 2024-04-26 ENCOUNTER — Encounter: Payer: Self-pay | Admitting: Pharmacist

## 2024-04-26 ENCOUNTER — Other Ambulatory Visit: Payer: Self-pay | Admitting: Pharmacist

## 2024-04-26 DIAGNOSIS — N183 Chronic kidney disease, stage 3 unspecified: Secondary | ICD-10-CM

## 2024-04-26 NOTE — Patient Instructions (Signed)
 Goals Addressed             This Visit's Progress    Pharmacy Goals       The goal A1c is less than 7%. This is the best way to reduce the risk of the long term complications of diabetes, including heart disease, kidney disease, eye disease, strokes, and nerve damage. An A1c of less than 7% corresponds with fasting sugars less than 130 and 2 hour after meal sugars less than 180.   Estelle Grumbles, PharmD, [facility] [phone]

## 2024-04-26 NOTE — Progress Notes (Signed)
 04/26/2024 Name: Brandy Johnston MRN: [id number] DOB: Feb 14, 1961  Chief Complaint  Patient presents with   Medication Management   Medication Adherence    Brandy Johnston is a 63 y.o. year old female who presented for a telephone visit.   They were referred to the pharmacist by their Case Management Team  for assistance in managing medication adherence.   Reports unable to review medications today as is not currently home  Subjective:  Care Team: Primary Care Provider: Sowles, Krichna, MD ; Next Scheduled Visit: 05/17/2024 Cardiologist: Jacquie Maudlin, MD ; Next Scheduled Visit: 07/14/2024 Neurologist: Roby Chris, MD; Next Scheduled Visit: 07/14/2024 GI Specialist: Margeret Sheer Pulmonologist: Nguyen, Julie T, MD  Medication Access/Adherence  Current Pharmacy:  [name], [place] - [address] [place] [address] Phone: [phone] Fax: [phone]   Patient reports affordability concerns with their medications: No  Patient reports access/transportation concerns to their pharmacy: No  Patient reports adherence concerns with their medications:  No    Reports now receiving her medications pill packaged by [facility] and that this is going well. [facility] still working on synchronizing refills  Diabetes:  Current medications:  - Ozempic  0.5 mg once weekly on Mondays - Jardiance  25mg  daily  Medications tried in the past: Novolin N   Current glucose readings ranging: 110-170  Date of Download: 04/26/24 % Time CGM is active: 97% Average Glucose: 156 mg/dL Glucose Management Indicator: 7.0%  Glucose Variability: 29.2% (goal <36%) Time in Goal:  - Time in range 70-180: 73% - Time above range: 27% - Time below range: 0%   Patient reports recently felt shaky with blood sugar reading ~70, but improved with eating    Objective:  Lab Results  Component Value Date   HGBA1C 6.5 (A) 09/08/2023     Lab Results  Component Value Date   CREATININE 1.74 (H) 03/22/2024   BUN 14 03/22/2024   NA 135 03/22/2024   K 4.0 03/22/2024   CL 99 03/22/2024   CO2 29 03/22/2024    Lab Results  Component Value Date   CHOL 179 01/18/2024   HDL 92 01/18/2024   LDLCALC 60 01/18/2024   TRIG 203 (H) 01/18/2024   CHOLHDL 1.9 01/18/2024    Medications Reviewed Today     Reviewed by Ardis Becton, RPH-CPP (Pharmacist) on 04/26/24 at 1446  Med List Status: <None>   Medication Order Taking? Sig Documenting Provider Last Dose Status Informant  albuterol  (VENTOLIN  HFA) 108 (90 Base) MCG/ACT inhaler [id number]  SMARTSIG:2 Puff(s) By Mouth Every 4-6 Hours PRN [provider]  Active   aspirin  (ASPIRIN  81) 81 MG chewable tablet [id number]  Chew 1 tablet (81 mg total) by mouth daily. Sowles, Krichna, MD  Active Self  Atogepant  (QULIPTA ) 30 MG TABS [id number]  Take 1 tablet (30 mg total) by mouth daily at 12 noon. Sowles, Krichna, MD  Active   benralizumab [facility] PEN) 30 MG/ML prefilled autoinjector [id number] Yes Inject into the skin. [provider] Taking Active            Med Note (LLOYD, CRYSTAL L   Wed Apr 12, 2024 11:53 AM) Last 3/17, due this week   Blood Glucose Monitoring Suppl (CONTOUR NEXT ONE) KIT [id number]  USE TO TEST BLOOD SUGAR ONCE D [provider]  Active Self  Budeson-Glycopyrrol-Formoterol (BREZTRI  AEROSPHERE) 160-9-4.8 MCG/ACT AERO [id number] Yes Inhale 2 puffs into the lungs in the morning and at bedtime. [provider] Taking Active   Cholecalciferol (VITAMIN D ) 2000 units CAPS [id number]  Take 1 capsule (2,000 Units total) by mouth daily. Sowles, Krichna, MD  Active Self  Continuous Glucose Sensor (FREESTYLE LIBRE 3 SENSOR) [place] [id number]  1 Device by Does not apply route every 14 (fourteen) days. Sowles, Krichna, MD  Active   cyclobenzaprine  (FLEXERIL ) 5 MG tablet [id number]  Take 1 tablet (5 mg total) by mouth at bedtime. Sowles, Krichna, MD   Active   diclofenac  Sodium (VOLTAREN ) 1 % GEL [id number]  Apply 2 g topically 4 (four) times daily. Sowles, Krichna, MD  Active   diltiazem  (CARDIZEM  CD) 180 MG 24 hr capsule [id number]  Take 1 capsule (180 mg total) by mouth daily. Sowles, Krichna, MD  Active            Med Note Welford Haley, CRYSTAL L   Wed Apr 12, 2024 11:54 AM) Per patient increased to 240 mg by Cardiology 04/07/24   empagliflozin  (JARDIANCE ) 25 MG TABS tablet [id number] Yes Take 1 tablet (25 mg total) by mouth daily before breakfast. Sowles, Krichna, MD Taking Active   EPINEPHrine  0.3 mg/0.3 mL IJ SOAJ injection [id number]  Inject into the muscle. [provider]  Active   ezetimibe  (ZETIA ) 10 MG tablet [id number]  Take 1 tablet (10 mg total) by mouth daily. Sowles, Krichna, MD  Active   fluticasone  [facility]) 50 MCG/ACT nasal spray [id number]  2 sprays each nostril Daily. DISP#  1 bottles = 1 month supply. [provider]  Active   fluticasone  furoate-vilanterol (BREO ELLIPTA ) 100-25 MCG/ACT AEPB [id number]  Inhale 1 puff into the lungs daily. [provider]  Active   gabapentin  (NEURONTIN ) 300 MG capsule [id number]  Take 1 capsule (300 mg total) by mouth 3 (three) times daily. Sowles, Krichna, MD  Active   glucose blood test strip [id number]  Use as instructed Sowles, Krichna, MD  Active Self  insulin  NPH Human (NOVOLIN N) 100 UNIT/ML injection [id number] No Inject into the skin.  Patient not taking: Reported on 04/26/2024   [provider] Not Taking Active            Med Note Manfred Seed, RAVEN M   Mon Mar 20, 2024  9:19 AM) 5U in AM + 5U in PM (if BG>250)  ipratropium-albuterol  (DUONEB) 0.5-2.5 (3) MG/3ML SOLN [id number] Yes Take 3 mLs by nebulization every 6 (six) hours as needed. Quinton Buckler, FNP Taking Active     Discontinued 01/29/20 1715   loratadine  (CLARITIN ) 10 MG tablet [id number]  Take 1 tablet (10 mg total) by mouth daily. Sowles, Krichna, MD  Active   magnesium  oxide (MAG-OX) 400 MG tablet  [id number]  Take 1 tablet (400 mg total) by mouth 2 (two) times daily. Sowles, Krichna, MD  Active   metoprolol  succinate (TOPROL -XL) 25 MG 24 hr tablet [id number]  Take 0.5 tablets (12.5 mg total) by mouth daily. Takes 1/2 tablet Sowles, Krichna, MD  Active   Microlet Lancets MISC [id number]  USE TO CHECK BLOOD SUGAR ONCE D [provider]  Active Self  montelukast (SINGULAIR) 10 MG tablet [id number]  Take by mouth. [provider]  Active   nystatin  (MYCOSTATIN ) 100000 UNIT/ML suspension [id number]  Take 5 mLs (500,000 Units total) by mouth 4 (four) times daily.  Patient not taking: Reported on 04/12/2024   Sowles, Krichna, MD  Active            Med Note Welford Haley, CRYSTAL L   Wed Apr 12, 2024 11:54 AM) Olla Bevels resolved  ondansetron  (ZOFRAN -ODT) 4 MG disintegrating tablet [id number]  Take 1 tablet (4 mg total) by mouth every 8 (eight) hours as needed for nausea. Quinton Buckler, FNP  Active   pantoprazole  (PROTONIX ) 40 MG tablet [id number]  Take 1 tablet (40 mg total) by mouth 2 (two) times daily. Sowles, Krichna, MD  Active   promethazine  (PHENERGAN ) 25 MG tablet [id number]  Take 1 tablet (25 mg total) by mouth every 8 (eight) hours as needed for nausea or vomiting.  Patient taking differently: Take 25 mg by mouth every 8 (eight) hours as needed for nausea or vomiting (PRN).   Quinton Buckler, FNP  Active            Med Note (LLOYD, CRYSTAL L   Wed Apr 12, 2024 11:55 AM) Taking PRN  rosuvastatin  (CRESTOR ) 40 MG tablet [id number]  Take 1 tablet (40 mg total) by mouth daily. Sowles, Krichna, MD  Active   Semaglutide ,0.25 or 0.5MG /DOS, (OZEMPIC , 0.25 OR 0.5 MG/DOSE,) 2 MG/3ML SOPN [id number] Yes Inject 0.5 mg into the skin once a week. Sowles, Krichna, MD Taking Active   sodium chloride  0.9 % nebulizer solution [id number]  Inhale into the lungs. [provider]  Active   Spacer/Aero-Holding [facility] DIAM[facility]) MISC [id number]   [provider]  Active   Ubrogepant   (UBRELVY ) 100 MG TABS [id number]  Take 1 tablet (100 mg total) by mouth daily as needed. Sowles, Krichna, MD  Active               Assessment/Plan:   Unable to review medications today as is not currently home, but plan to review during our next telephone appointment  Diabetes: - Currently controlled - Reviewed long term cardiovascular and renal outcomes of uncontrolled blood sugar - Reviewed goal A1c, goal fasting, and goal 2 hour post prandial glucose - Recommend to continue to use Freestyle Libre 3 CGM to monitor blood sugar/as feedback on dietary choices Recommend to check glucose with fingerstick check when needed for symptoms and as back up to CGM.  Patient to contact office if needed for readings outside of established parameters or symptoms   Follow Up Plan: Clinical Pharmacist will follow up with patient by telephone on 07/12/2024 at 10:00 AM   Arthur Lash, PharmD, [facility] [phone]

## 2024-04-27 ENCOUNTER — Other Ambulatory Visit: Payer: Self-pay | Admitting: Family Medicine

## 2024-04-27 DIAGNOSIS — N1831 Chronic kidney disease, stage 3a: Secondary | ICD-10-CM

## 2024-04-27 DIAGNOSIS — E1129 Type 2 diabetes mellitus with other diabetic kidney complication: Secondary | ICD-10-CM

## 2024-05-01 ENCOUNTER — Telehealth: Payer: Self-pay

## 2024-05-01 NOTE — Telephone Encounter (Signed)
 Copied from CRM [id number]. Topic: General - Other >> May 01, 2024  1:18 PM Yolanda T wrote: Reason for CRM: patient called to see if she could get a handicap place card.

## 2024-05-02 NOTE — Telephone Encounter (Signed)
 PCP folder to sign

## 2024-05-03 ENCOUNTER — Ambulatory Visit: Payer: Medicare HMO

## 2024-05-04 ENCOUNTER — Other Ambulatory Visit: Payer: Self-pay | Admitting: Family Medicine

## 2024-05-04 DIAGNOSIS — E1129 Type 2 diabetes mellitus with other diabetic kidney complication: Secondary | ICD-10-CM

## 2024-05-04 DIAGNOSIS — B370 Candidal stomatitis: Secondary | ICD-10-CM

## 2024-05-04 DIAGNOSIS — E1142 Type 2 diabetes mellitus with diabetic polyneuropathy: Secondary | ICD-10-CM

## 2024-05-04 NOTE — Telephone Encounter (Signed)
 Copied from CRM [id number]. Topic: Clinical - Medication Refill >> May 04, 2024 11:26 AM Crispin Dolphin wrote: Medication:  cyclobenzaprine  (FLEXERIL ) 5 MG tablet  Semaglutide ,0.25 or 0.5MG /DOS, (OZEMPIC , 0.25 OR 0.5 MG/DOSE,) 2 MG/3ML SOPN  Continuous Glucose Sensor (FREESTYLE LIBRE 3 SENSOR) MISC  nystatin  (MYCOSTATIN ) 100000 UNIT/ML suspension    Has the patient contacted their pharmacy? Yes - pharmacy requested  (Agent: If no, request that the patient contact the pharmacy for the refill. If patient does not wish to contact the pharmacy document the reason why and proceed with request.) (Agent: If yes, when and what did the pharmacy advise?)  This is the patient's preferred pharmacy:  [facility], [place] - [address] [place] [address] Phone: [phone] Fax: [phone]  Is this the correct pharmacy for this prescription? Yes If no, delete pharmacy and type the correct one.   Has the prescription been filled recently? Yes - patient just received medication package - pharmacy requesting refills for file.   Is the patient out of the medication? No  Has the patient been seen for an appointment in the last year OR does the patient have an upcoming appointment? Yes  Can we respond through MyChart? No  Agent: Please be advised that Rx refills may take up to 3 business days. We ask that you follow-up with your pharmacy.

## 2024-05-05 ENCOUNTER — Ambulatory Visit (INDEPENDENT_AMBULATORY_CARE_PROVIDER_SITE_OTHER)

## 2024-05-05 VITALS — BP 120/60 | Ht 60.0 in | Wt 157.0 lb

## 2024-05-05 DIAGNOSIS — Z794 Long term (current) use of insulin: Secondary | ICD-10-CM

## 2024-05-05 DIAGNOSIS — E1122 Type 2 diabetes mellitus with diabetic chronic kidney disease: Secondary | ICD-10-CM | POA: Diagnosis not present

## 2024-05-05 DIAGNOSIS — Z5320 Procedure and treatment not carried out because of patient's decision for unspecified reasons: Secondary | ICD-10-CM

## 2024-05-05 DIAGNOSIS — N183 Chronic kidney disease, stage 3 unspecified: Secondary | ICD-10-CM | POA: Diagnosis not present

## 2024-05-05 DIAGNOSIS — E1165 Type 2 diabetes mellitus with hyperglycemia: Secondary | ICD-10-CM | POA: Diagnosis not present

## 2024-05-05 DIAGNOSIS — Z0000 Encounter for general adult medical examination without abnormal findings: Secondary | ICD-10-CM

## 2024-05-05 DIAGNOSIS — Z0001 Encounter for general adult medical examination with abnormal findings: Secondary | ICD-10-CM | POA: Diagnosis not present

## 2024-05-05 DIAGNOSIS — Z2821 Immunization not carried out because of patient refusal: Secondary | ICD-10-CM

## 2024-05-05 DIAGNOSIS — I129 Hypertensive chronic kidney disease with stage 1 through stage 4 chronic kidney disease, or unspecified chronic kidney disease: Secondary | ICD-10-CM

## 2024-05-05 DIAGNOSIS — E119 Type 2 diabetes mellitus without complications: Secondary | ICD-10-CM

## 2024-05-05 NOTE — Progress Notes (Signed)
 Because this visit was a virtual/telehealth visit,  certain criteria was not obtained, such a blood pressure, CBG if applicable, and timed get up and go. Any medications not marked as "taking" were not mentioned during the medication reconciliation part of the visit. Any vitals not documented were not able to be obtained due to this being a telehealth visit or patient was unable to self-report a recent blood pressure reading due to a lack of equipment at home via telehealth. Vitals that have been documented are verbally provided by the patient.   This visit was performed by a medical professional under my direct supervision. I was immediately available for consultation/collaboration. I have reviewed and agree with the Annual Wellness Visit documentation.  Subjective:   Brandy Johnston is a 63 y.o. who presents for a Medicare Wellness preventive visit.  As a reminder, Annual Wellness Visits don't include a physical exam, and some assessments may be limited, especially if this visit is performed virtually. We may recommend an in-person visit if needed.  Visit Complete: Virtual I connected with  Letta Raw on 05/05/24 by a video and audio enabled telemedicine application and verified that I am speaking with the correct person using two identifiers.  Patient Location: Home  Provider Location: Home Office  I discussed the limitations of evaluation and management by telemedicine. The patient expressed understanding and agreed to proceed.  Vital Signs: Because this visit was a virtual/telehealth visit, some criteria may be missing or patient reported. Any vitals not documented were not able to be obtained and vitals that have been documented are patient reported.  VideoDeclined- This patient declined Librarian, academic. Therefore the visit was completed with audio only.  Persons Participating in Visit: Patient.  AWV Questionnaire: No: Patient Medicare AWV questionnaire  was not completed prior to this visit.  Cardiac Risk Factors include: advanced age (>62men, >31 women);diabetes mellitus;obesity (BMI >30kg/m2);hypertension;dyslipidemia     Objective:     Today's Vitals   05/05/24 1054 05/05/24 1055  BP: 120/60   Weight: 157 lb (71.2 kg)   Height: 5' (1.524 m)   PainSc:  6    Body mass index is 30.66 kg/m.     05/05/2024   10:53 AM 10/15/2023    2:21 PM 08/10/2023    2:24 PM 08/04/2022    6:03 PM 12/26/2020    2:23 PM 01/29/2020   11:30 PM 11/21/2019    3:30 PM  Advanced Directives  Does Patient Have a Medical Advance Directive? No No No No No No No  Would patient like information on creating a medical advance directive? No - Patient declined     No - Patient declined No - Patient declined    Current Medications (verified) Outpatient Encounter Medications as of 05/05/2024  Medication Sig   albuterol  (VENTOLIN  HFA) 108 (90 Base) MCG/ACT inhaler SMARTSIG:2 Puff(s) By Mouth Every 4-6 Hours PRN   aspirin  (ASPIRIN  81) 81 MG chewable tablet Chew 1 tablet (81 mg total) by mouth daily.   Atogepant  (QULIPTA ) 30 MG TABS Take 1 tablet (30 mg total) by mouth daily at 12 noon.   benralizumab (FASENRA PEN) 30 MG/ML prefilled autoinjector Inject into the skin.   Blood Glucose Monitoring Suppl (CONTOUR NEXT ONE) KIT USE TO TEST BLOOD SUGAR ONCE D   Budeson-Glycopyrrol-Formoterol (BREZTRI  AEROSPHERE) 160-9-4.8 MCG/ACT AERO Inhale 2 puffs into the lungs in the morning and at bedtime.   Cholecalciferol (VITAMIN D ) 2000 units CAPS Take 1 capsule (2,000 Units total) by mouth daily.  Continuous Glucose Sensor (FREESTYLE LIBRE 3 SENSOR) MISC 1 Device by Does not apply route every 14 (fourteen) days.   cyclobenzaprine  (FLEXERIL ) 5 MG tablet Take 1 tablet (5 mg total) by mouth at bedtime.   diclofenac  Sodium (VOLTAREN ) 1 % GEL Apply 2 g topically 4 (four) times daily.   diltiazem  (CARDIZEM  CD) 180 MG 24 hr capsule Take 1 capsule (180 mg total) by mouth daily.    empagliflozin  (JARDIANCE ) 25 MG TABS tablet Take 1 tablet (25 mg total) by mouth daily before breakfast.   EPINEPHrine  0.3 mg/0.3 mL IJ SOAJ injection Inject into the muscle.   ezetimibe  (ZETIA ) 10 MG tablet Take 1 tablet (10 mg total) by mouth daily.   fluticasone  (FLONASE) 50 MCG/ACT nasal spray 2 sprays each nostril Daily. DISP#  1 bottles = 1 month supply.   fluticasone  furoate-vilanterol (BREO ELLIPTA ) 100-25 MCG/ACT AEPB Inhale 1 puff into the lungs daily.   gabapentin  (NEURONTIN ) 300 MG capsule Take 1 capsule (300 mg total) by mouth 3 (three) times daily.   glucose blood test strip Use as instructed   insulin  NPH Human (NOVOLIN N) 100 UNIT/ML injection Inject into the skin.   ipratropium-albuterol  (DUONEB) 0.5-2.5 (3) MG/3ML SOLN Take 3 mLs by nebulization every 6 (six) hours as needed.   loratadine  (CLARITIN ) 10 MG tablet Take 1 tablet (10 mg total) by mouth daily.   magnesium  oxide (MAG-OX) 400 MG tablet Take 1 tablet (400 mg total) by mouth 2 (two) times daily.   metoprolol  succinate (TOPROL -XL) 25 MG 24 hr tablet Take 0.5 tablets (12.5 mg total) by mouth daily. Takes 1/2 tablet   Microlet Lancets MISC USE TO CHECK BLOOD SUGAR ONCE D   montelukast (SINGULAIR) 10 MG tablet Take by mouth.   nystatin  (MYCOSTATIN ) 100000 UNIT/ML suspension Take 5 mLs (500,000 Units total) by mouth 4 (four) times daily.   ondansetron  (ZOFRAN -ODT) 4 MG disintegrating tablet Take 1 tablet (4 mg total) by mouth every 8 (eight) hours as needed for nausea.   pantoprazole  (PROTONIX ) 40 MG tablet Take 1 tablet (40 mg total) by mouth 2 (two) times daily.   promethazine  (PHENERGAN ) 25 MG tablet Take 1 tablet (25 mg total) by mouth every 8 (eight) hours as needed for nausea or vomiting. (Patient taking differently: Take 25 mg by mouth every 8 (eight) hours as needed for nausea or vomiting (PRN).)   rosuvastatin  (CRESTOR ) 40 MG tablet Take 1 tablet (40 mg total) by mouth daily.   Semaglutide ,0.25 or 0.5MG /DOS, (OZEMPIC ,  0.25 OR 0.5 MG/DOSE,) 2 MG/3ML SOPN Inject 0.5 mg into the skin once a week.   sodium chloride  0.9 % nebulizer solution Inhale into the lungs.   Spacer/Aero-Holding Chambers (OPTICHAMBER DIAMOND) MISC    Ubrogepant  (UBRELVY ) 100 MG TABS Take 1 tablet (100 mg total) by mouth daily as needed.   [DISCONTINUED] lisinopril  (ZESTRIL ) 10 MG tablet Take 1 tablet (10 mg total) by mouth daily.   No facility-administered encounter medications on file as of 05/05/2024.    Allergies (verified) Ace inhibitors, Lisinopril , and Quetiapine   History: Past Medical History:  Diagnosis Date   Anemia    Chronic renal impairment, stage 3 (moderate) (HCC)    Diabetes mellitus without complication (HCC)    No longer on meds   GERD (gastroesophageal reflux disease)    Hyperlipidemia    Hypertension    Hypokalemia    Insomnia    Low calcium  levels    Osteoarthrosis, hip    right hip   Paresthesia  Sickle cell anemia (HCC)    Sickle-cell trait (HCC)    Tension headache    1x/mo   Wears contact lenses    Past Surgical History:  Procedure Laterality Date   BREAST BIOPSY Right 12/28/2019   stereo [facility] stromal fibrosis   CHOLECYSTECTOMY     COLONOSCOPY     ESOPHAGOGASTRODUODENOSCOPY (EGD) WITH PROPOFOL  N/A 10/05/2016   Procedure: ESOPHAGOGASTRODUODENOSCOPY (EGD) WITH PROPOFOL ;  Surgeon: [name], MD;  Location: [facility];  Service: Endoscopy;  Laterality: N/A;   FRACTURE SURGERY Left    cast and pins    SHOULDER ARTHROSCOPY WITH ROTATOR CUFF REPAIR AND SUBACROMIAL DECOMPRESSION Left 12/07/2018   Procedure: left shoulder manipulation under anesthesia, left shoulder arthroscopic lysis of adhesions;  Surgeon: [name], MD;  Location: [facility];  Service: Orthopedics;  Laterality: Left;   SHOULDER CLOSED REDUCTION Left 12/07/2018   Procedure: CLOSED MANIPULATION SHOULDER;  Surgeon: [name], MD;  Location: [facility];  Service: Orthopedics;  Laterality: Left;   shoulder surgery   Left 06/10/2018   Dr. [name]   TUBAL LIGATION     Family History  Problem Relation Age of Onset   Migraines Mother    Diabetes Mother    Cancer Mother        lung   Arthritis Brother    Breast cancer Maternal Aunt    Cancer Maternal Uncle        Lung and Colon   Cirrhosis Brother    Breast cancer Cousin    Social History   Socioeconomic History   Marital status: Single    Spouse name: Not on file   Number of children: 2   Years of education: Not on file   Highest education level: 12th grade  Occupational History   Occupation: disabled  Tobacco Use   Smoking status: Never   Smokeless tobacco: Never  Vaping Use   Vaping status: Never Used  Substance and Sexual Activity   Alcohol use: Yes    Alcohol/week: 0.0 standard drinks of alcohol    Comment: rare   Drug use: Yes    Types: Marijuana    Comment: smokes marijuana occasionally   Sexual activity: Yes    Partners: Female    Birth control/protection: Other-see comments  Other Topics Concern   Not on file  Social History Narrative   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, on disability secondary to shoulder injury in 2019    She has two grown children ( boy and a girl)    Social Drivers of Corporate investment banker Strain: Low Risk  (05/05/2024)   Overall Financial Resource Strain (CARDIA)    Difficulty of Paying Living Expenses: Not hard at all  Recent Concern: Financial Resource Strain - Medium Risk (03/20/2024)   Received from [facility]   Overall Financial Resource Strain (CARDIA)    Difficulty of Paying Living Expenses: Somewhat hard  Food Insecurity: No Food Insecurity (05/05/2024)   Hunger Vital Sign    Worried About Running Out of Food in the Last Year: Never true    Ran Out of Food in the Last Year: Never true  Recent Concern: Food Insecurity - Food Insecurity Present (03/07/2024)   Received from [facility]   Hunger Vital Sign    Worried  About Running Out of Food in the Last Year: Sometimes true    Ran Out of Food in the Last Year: Sometimes true  Transportation  Needs: No Transportation Needs (05/05/2024)   PRAPARE - Administrator, Civil Service (Medical): No    Lack of Transportation (Non-Medical): No  Recent Concern: Transportation Needs - Unmet Transportation Needs (03/07/2024)   [facility] from [facility] - [facility]    In the past 12 months, has lack of transportation kept you from medical appointments or from getting medications?: Yes    Lack of Transportation (Non-Medical): No  Physical Activity: Insufficiently Active (05/05/2024)   Exercise Vital Sign    Days of Exercise per Week: 3 days    Minutes of Exercise per Session: 20 min  Stress: No Stress Concern Present (05/05/2024)   [employer] of Occupational Health - Occupational Stress Questionnaire    Feeling of Stress : Not at all  Social Connections: Socially Isolated (05/05/2024)   Social Connection and Isolation Panel [NHANES]    Frequency of Communication with Friends and Family: More than three times a week    Frequency of Social Gatherings with Friends and Family: Twice a week    Attends Religious Services: Never    Database administrator or Organizations: No    Attends Engineer, structural: Never    Marital Status: Never married    Tobacco Counseling Counseling given: Not Answered    Clinical Intake:  Pre-visit preparation completed: Yes  Pain : 0-10 Pain Score: 6  Pain Type: Chronic pain Pain Location: Shoulder Pain Orientation: Left Pain Descriptors / Indicators: Sore, Constant Pain Onset: Today Pain Frequency: Several days a week Pain Relieving Factors: get an injection  Pain Relieving Factors: get an injection  BMI - recorded: 30.66 Nutritional Status: BMI > 30  Obese Nutritional Risks: None Diabetes: Yes CBG done?: No Did pt. bring in CBG monitor from home?: No  Lab Results   Component Value Date   HGBA1C 6.5 (A) 09/08/2023   HGBA1C 6.1 (A) 05/05/2023   HGBA1C 6.3 (A) 12/30/2022     How often do you need to have someone help you when you read instructions, pamphlets, or other written materials from your doctor or pharmacy?: 1 - Never What is the last grade level you completed in school?: HS [employer]  Interpreter Needed?: No  Information entered by :: [employer]   Activities of Daily Living     05/05/2024   11:00 AM 12/09/2023   11:21 AM  In your present state of health, do you have any difficulty performing the following activities:  Hearing? 0 0  Vision? 0 0  Difficulty concentrating or making decisions? 0 0  Walking or climbing stairs? 0 0  Dressing or bathing? 1 0  Comment patient has a aid that helps   Doing errands, shopping? 0 0  Preparing Food and eating ? N   Using the Toilet? N   In the past six months, have you accidently leaked urine? Y   Do you have problems with loss of bowel control? N   Managing your Medications? N   Managing your Finances? N   Housekeeping or managing your Housekeeping? Y   Comment patient has a aid that helps     Patient Care Team: Sowles, Krichna, MD as PCP - General (Family Medicine) Devorah Fonder, MD as Consulting Physician (Cardiology) [facility], [facility] (Optometry) Welford Haley, Josphine Nims, RN (Inactive) as VBCI Care Management Delles, Severa Daniels, RPH-CPP as Pharmacist  Indicate any recent Medical Services you may have received from other than [facility] providers in the past year (date  may be approximate).     Assessment:    This is a routine wellness examination for [place].  Hearing/Vision screen Hearing Screening - Comments:: Patient does not have and hearing difficulties Vision Screening - Comments:: Patient wear glasses    Goals Addressed             This Visit's Progress    Patient Stated       To get better       Depression Screen     05/05/2024   11:04 AM 03/17/2024     2:03 PM 02/04/2024   10:09 AM 01/18/2024   11:27 AM 12/09/2023   11:21 AM 11/18/2023    9:23 AM 09/15/2023    9:26 AM  PHQ 2/9 Scores  PHQ - 2 Score 0 0 0 0 0 0 0  PHQ- 9 Score 0 0 0 0  0 0    Fall Risk     05/05/2024   10:59 AM 04/12/2024   12:12 PM 04/05/2024   11:07 AM 01/18/2024   11:17 AM 11/18/2023    9:23 AM  Fall Risk   Falls in the past year? 0 0 0 0 0  Number falls in past yr: 0 0 0 0   Injury with Fall? 0   0   Risk for fall due to : No Fall Risks Impaired mobility;Impaired balance/gait;Medication side effect  No Fall Risks No Fall Risks  Follow up Falls prevention discussed;Falls evaluation completed Falls prevention discussed  Falls prevention discussed;Education provided;Falls evaluation completed Falls prevention discussed    MEDICARE RISK AT HOME:  Medicare Risk at Home Any stairs in or around the home?: Yes If so, are there any without handrails?: No Home free of loose throw rugs in walkways, pet beds, electrical cords, etc?: Yes Adequate lighting in your home to reduce risk of falls?: Yes Life alert?: Yes Use of a cane, walker or w/c?: Yes (walker) Grab bars in the bathroom?: Yes Shower chair or bench in shower?: Yes Elevated toilet seat or a handicapped toilet?: No  TIMED UP AND GO:  Was the test performed?  No  Cognitive Function: 6CIT completed        05/05/2024   10:57 AM 01/19/2023    8:50 AM  6CIT Screen  What Year? 0 points 0 points  What month? 0 points 0 points  What time? 0 points 0 points  Count back from 20 0 points 0 points  Months in reverse 0 points 4 points  Repeat phrase 0 points 0 points  Total Score 0 points 4 points    Immunizations Immunization History  Administered Date(s) Administered   Influenza, Seasonal, Injecte, Preservative Fre 08/19/2012, 11/18/2023   Influenza,inj,Quad PF,6+ Mos 08/09/2014, 08/16/2015, 01/20/2017, 09/16/2017, 09/19/2018, 10/19/2019, 09/29/2022   Influenza-Unspecified 08/09/2014, 10/07/2020   Moderna  Covid-19 Fall Seasonal Vaccine 89yrs & older 12/07/2022   PFIZER(Purple Top)SARS-COV-2 Vaccination 03/02/2020, 03/23/2020, 10/28/2020   Pfizer Covid-19 Vaccine Bivalent Booster 64yrs & up 11/26/2021   Pneumococcal Conjugate-13 12/03/2015   Pneumococcal Polysaccharide-23 06/19/2010, 06/22/2022   Respiratory Syncytial Virus Vaccine,Recomb Aduvanted(Arexvy) 12/07/2022   Tdap 05/25/2008, 09/28/2018, 06/22/2022   Zoster Recombinant(Shingrix) 05/16/2021, 09/04/2021    Screening Tests Health Maintenance  Topic Date Due   OPHTHALMOLOGY EXAM  02/02/2023   COVID-19 Vaccine (6 - 2024-25 season) 08/29/2023   FOOT EXAM  09/30/2023   Cervical Cancer Screening (Pap smear)  11/10/2023   HEMOGLOBIN A1C  03/07/2024   INFLUENZA VACCINE  07/28/2024   Diabetic kidney evaluation - Urine ACR  01/17/2025   Diabetic kidney evaluation - eGFR measurement  03/22/2025   Medicare Annual Wellness (AWV)  05/05/2025   MAMMOGRAM  04/14/2026   Pneumococcal Vaccine 32-15 Years old (4 of 4 - PCV20 or PCV21) 06/23/2027   Colonoscopy  04/17/2030   DTaP/Tdap/Td (4 - Td or Tdap) 06/22/2032   Hepatitis C Screening  Completed   HIV Screening  Completed   Zoster Vaccines- Shingrix  Completed   HPV VACCINES  Aged Out   Meningococcal B Vaccine  Aged Out    Health Maintenance  Health Maintenance Due  Topic Date Due   OPHTHALMOLOGY EXAM  02/02/2023   COVID-19 Vaccine (6 - 2024-25 season) 08/29/2023   FOOT EXAM  09/30/2023   Cervical Cancer Screening (Pap smear)  11/10/2023   HEMOGLOBIN A1C  03/07/2024   Health Maintenance Items Addressed: Diabetic Foot Exam scheduled, A1C  Additional Screening:  Vision Screening: Recommended annual ophthalmology exams for early detection of glaucoma and other disorders of the eye.  Dental Screening: Recommended annual dental exams for proper oral hygiene  Community Resource Referral / Chronic Care Management: CRR required this visit?  No   CCM required this visit?   No   Plan:    I have personally reviewed and noted the following in the patient's chart:   Medical and social history Use of alcohol, tobacco or illicit drugs  Current medications and supplements including opioid prescriptions. Patient is not currently taking opioid prescriptions. Functional ability and status Nutritional status Physical activity Advanced directives List of other physicians Hospitalizations, surgeries, and ER visits in previous 12 months Vitals Screenings to include cognitive, depression, and falls Referrals and appointments  In addition, I have reviewed and discussed with patient certain preventive protocols, quality metrics, and best practice recommendations. A written personalized care plan for preventive services as well as general preventive health recommendations were provided to patient.   Freeda Jerry, [place]   05/05/2024   After Visit Summary: (MyChart) Due to this being a telephonic visit, the after visit summary with patients personalized plan was offered to patient via MyChart   Notes: Nothing significant to report at this time.

## 2024-05-05 NOTE — Patient Instructions (Signed)
 Ms. Lamantia , Thank you for taking time out of your busy schedule to complete your Annual Wellness Visit with me. I enjoyed our conversation and look forward to speaking with you again next year. I, as well as your care team,  appreciate your ongoing commitment to your health goals. Please review the following plan we discussed and let me know if I can assist you in the future. Your Game plan/ To Do List    Referrals: If you haven't heard from the office you've been referred to, please reach out to them at the phone provided.  Foot exam has been ordered, A1c and need proof of eye exam  Follow up Visits: Next Medicare AWV with our clinical staff: 05/24/2025   Have you seen your provider in the last 6 months (3 months if uncontrolled diabetes)? Yes Next Office Visit with your provider: 05/17/2024 11:00  Clinician Recommendations:  Aim for 30 minutes of exercise or brisk walking, 6-8 glasses of water , and 5 servings of fruits and vegetables each day.       This is a list of the screening recommended for you and due dates:  Health Maintenance  Topic Date Due   Eye exam for diabetics  02/02/2023   COVID-19 Vaccine (6 - 2024-25 season) 08/29/2023   Complete foot exam   09/30/2023   Pap Smear  11/10/2023   Hemoglobin A1C  03/07/2024   Flu Shot  07/28/2024   Yearly kidney health urinalysis for diabetes  01/17/2025   Yearly kidney function blood test for diabetes  03/22/2025   Medicare Annual Wellness Visit  05/05/2025   Mammogram  04/14/2026   Pneumococcal Vaccination (4 of 4 - PCV20 or PCV21) 06/23/2027   Colon Cancer Screening  04/17/2030   DTaP/Tdap/Td vaccine (4 - Td or Tdap) 06/22/2032   Hepatitis C Screening  Completed   HIV Screening  Completed   Zoster (Shingles) Vaccine  Completed   HPV Vaccine  Aged Out   Meningitis B Vaccine  Aged Out    Advanced directives: (Declined) Advance directive discussed with you today. Even though you declined this today, please call our office should  you change your mind, and we can give you the proper paperwork for you to fill out. Advance Care Planning is important because it:  [x]  Makes sure you receive the medical care that is consistent with your values, goals, and preferences  [x]  It provides guidance to your family and loved ones and reduces their decisional burden about whether or not they are making the right decisions based on your wishes.  Follow the link provided in your after visit summary or read over the paperwork we have mailed to you to help you started getting your Advance Directives in place. If you need assistance in completing these, please reach out to us  so that we can help you!  See attachments for Preventive Care and Fall Prevention Tips.

## 2024-05-08 ENCOUNTER — Other Ambulatory Visit: Payer: Self-pay | Admitting: Family Medicine

## 2024-05-08 DIAGNOSIS — J45909 Unspecified asthma, uncomplicated: Secondary | ICD-10-CM

## 2024-05-08 NOTE — Telephone Encounter (Signed)
 Too soon for refill, refilled 03/22/24 for 90 days.  Requested Prescriptions  Pending Prescriptions Disp Refills   Semaglutide ,0.25 or 0.5MG /DOS, (OZEMPIC , 0.25 OR 0.5 MG/DOSE,) 2 MG/3ML SOPN 6 mL 0    Sig: Inject 0.5 mg into the skin once a week.     Endocrinology:  Diabetes - GLP-1 Receptor Agonists - semaglutide  Failed - 05/08/2024  8:59 AM      Failed - HBA1C in normal range and within 180 days    Hemoglobin A1C  Date Value Ref Range Status  09/08/2023 6.5 (A) 4.0 - 5.6 % Final  07/08/2012 6.1 4.2 - 6.3 % Final    Comment:    The American Diabetes Association recommends that a primary goal of therapy should be <7% and that physicians should reevaluate the treatment regimen in patients with HbA1c values consistently >8%.    HbA1c, POC (prediabetic range)  Date Value Ref Range Status  09/28/2018 6.2 5.7 - 6.4 % Final   HbA1c, POC (controlled diabetic range)  Date Value Ref Range Status  01/09/2019 5.9 0.0 - 7.0 % Final   Hgb A1c MFr Bld  Date Value Ref Range Status  01/29/2020 5.8 (H) 4.8 - 5.6 % Final    Comment:    (NOTE) Pre diabetes:          5.7%-6.4% Diabetes:              >6.4% Glycemic control for   <7.0% adults with diabetes          Failed - Cr in normal range and within 360 days    Creat  Date Value Ref Range Status  03/22/2024 1.74 (H) 0.50 - 1.05 mg/dL Final   Creatinine, Urine  Date Value Ref Range Status  01/18/2024 135 20 - 275 mg/dL Final         Passed - Valid encounter within last 6 months    Recent Outpatient Visits           1 month ago Elevated liver enzymes   [name], [facility]   1 month ago Hospital discharge follow-up   [name], [facility]   2 months ago Eosinophilic asthma   [name], [facility]   2 months ago Chronic maxillary sinusitis   [name] MD   3 months ago  Small vessel disease [facility])   [facility] Sowles, Krichna, MD       Future Appointments             In 1 week Sowles, Krichna, MD [facility]             Continuous Glucose Sensor (FREESTYLE LIBRE 3 SENSOR) MISC 6 each 1    Sig: 1 Device by Does not apply route every 14 (fourteen) days.     Endocrinology: Diabetes - Testing Supplies Passed - 05/08/2024  8:59 AM      Passed - Valid encounter within last 12 months    Recent Outpatient Visits           1 month ago Elevated liver enzymes   [facility] [name], MD   1 month ago Hospital discharge follow-up   [name], MD   2 months ago Eosinophilic asthma   [facility] [name], MD   2 months ago Chronic maxillary  sinusitis   [facility] [name], MD   3 months ago Small vessel disease [facility]   [facility] [name], MD       Future Appointments             In 1 week Sowles, Krichna, MD [facility]             cyclobenzaprine  (FLEXERIL ) 5 MG tablet 90 tablet 0    Sig: Take 1 tablet (5 mg total) by mouth at bedtime.     Not Delegated - Analgesics:  Muscle Relaxants Failed - 05/08/2024  8:59 AM      Failed - This refill cannot be delegated      Passed - Valid encounter within last 6 months    Recent Outpatient Visits           1 month ago Elevated liver enzymes   [facility] [name], MD   1 month ago Hospital discharge follow-up   [facility] [name]   2 months ago Eosinophilic asthma   [facility] [facility] [name], MD   2 months ago Chronic maxillary sinusitis   [facility] [name], MD   3 months ago Small  vessel disease [facility])   [facility] Sowles, Krichna, MD       Future Appointments             In 1 week Sowles, Krichna, MD [facility]             nystatin  (MYCOSTATIN ) 100000 UNIT/ML suspension 60 mL 1    Sig: Take 5 mLs (500,000 Units total) by mouth 4 (four) times daily.     Off-Protocol Failed - 05/08/2024  8:59 AM      Failed - Medication not assigned to a protocol, review manually.      Passed - Valid encounter within last 12 months    Recent Outpatient Visits           1 month ago Elevated liver enzymes   [name]   1 month ago Hospital discharge follow-up   [facility] [name], MD   2 months ago Eosinophilic asthma   [id number] [name], MD   2 months ago Chronic maxillary sinusitis   [facility] [name], MD   3 months ago Small vessel disease [facility])   [facility] Sowles, Krichna, MD       Future Appointments             In 1 week Sowles, Krichna, MD [facility]

## 2024-05-17 ENCOUNTER — Encounter: Payer: Self-pay | Admitting: Family Medicine

## 2024-05-17 ENCOUNTER — Ambulatory Visit: Payer: Self-pay | Admitting: Family Medicine

## 2024-05-17 VITALS — BP 112/72 | HR 93 | Resp 16 | Ht 60.0 in | Wt 159.8 lb

## 2024-05-17 DIAGNOSIS — I739 Peripheral vascular disease, unspecified: Secondary | ICD-10-CM

## 2024-05-17 DIAGNOSIS — N1831 Chronic kidney disease, stage 3a: Secondary | ICD-10-CM | POA: Diagnosis not present

## 2024-05-17 DIAGNOSIS — K219 Gastro-esophageal reflux disease without esophagitis: Secondary | ICD-10-CM | POA: Diagnosis not present

## 2024-05-17 DIAGNOSIS — J8283 Eosinophilic asthma: Secondary | ICD-10-CM

## 2024-05-17 DIAGNOSIS — N183 Chronic kidney disease, stage 3 unspecified: Secondary | ICD-10-CM

## 2024-05-17 DIAGNOSIS — M5442 Lumbago with sciatica, left side: Secondary | ICD-10-CM

## 2024-05-17 DIAGNOSIS — I129 Hypertensive chronic kidney disease with stage 1 through stage 4 chronic kidney disease, or unspecified chronic kidney disease: Secondary | ICD-10-CM

## 2024-05-17 DIAGNOSIS — E1122 Type 2 diabetes mellitus with diabetic chronic kidney disease: Secondary | ICD-10-CM | POA: Diagnosis not present

## 2024-05-17 DIAGNOSIS — G8929 Other chronic pain: Secondary | ICD-10-CM

## 2024-05-17 DIAGNOSIS — E1169 Type 2 diabetes mellitus with other specified complication: Secondary | ICD-10-CM | POA: Diagnosis not present

## 2024-05-17 DIAGNOSIS — G43011 Migraine without aura, intractable, with status migrainosus: Secondary | ICD-10-CM | POA: Diagnosis not present

## 2024-05-17 DIAGNOSIS — I700 Atherosclerosis of aorta: Secondary | ICD-10-CM

## 2024-05-17 DIAGNOSIS — I421 Obstructive hypertrophic cardiomyopathy: Secondary | ICD-10-CM | POA: Insufficient documentation

## 2024-05-17 DIAGNOSIS — M62838 Other muscle spasm: Secondary | ICD-10-CM

## 2024-05-17 DIAGNOSIS — I422 Other hypertrophic cardiomyopathy: Secondary | ICD-10-CM | POA: Diagnosis not present

## 2024-05-17 DIAGNOSIS — R1319 Other dysphagia: Secondary | ICD-10-CM | POA: Insufficient documentation

## 2024-05-17 LAB — CBC WITH DIFFERENTIAL/PLATELET
Absolute Lymphocytes: 2587 {cells}/uL (ref 850–3900)
Absolute Monocytes: 673 {cells}/uL (ref 200–950)
Basophils Absolute: 35 {cells}/uL (ref 0–200)
Basophils Relative: 0.3 %
Eosinophils Absolute: 0 {cells}/uL — ABNORMAL LOW (ref 15–500)
Eosinophils Relative: 0 %
HCT: 39.5 % (ref 35.0–45.0)
Hemoglobin: 12.6 g/dL (ref 11.7–15.5)
MCH: 27.6 pg (ref 27.0–33.0)
MCHC: 31.9 g/dL — ABNORMAL LOW (ref 32.0–36.0)
MCV: 86.6 fL (ref 80.0–100.0)
MPV: 10.6 fL (ref 7.5–12.5)
Monocytes Relative: 5.8 %
Neutro Abs: 8306 {cells}/uL — ABNORMAL HIGH (ref 1500–7800)
Neutrophils Relative %: 71.6 %
Platelets: 266 10*3/uL (ref 140–400)
RBC: 4.56 10*6/uL (ref 3.80–5.10)
RDW: 14.8 % (ref 11.0–15.0)
Total Lymphocyte: 22.3 %
WBC: 11.6 10*3/uL — ABNORMAL HIGH (ref 3.8–10.8)

## 2024-05-17 LAB — COMPREHENSIVE METABOLIC PANEL WITH GFR
AG Ratio: 1.4 (calc) (ref 1.0–2.5)
ALT: 10 U/L (ref 6–29)
AST: 17 U/L (ref 10–35)
Albumin: 4.2 g/dL (ref 3.6–5.1)
Alkaline phosphatase (APISO): 90 U/L (ref 37–153)
BUN/Creatinine Ratio: 6 (calc) (ref 6–22)
BUN: 9 mg/dL (ref 7–25)
CO2: 28 mmol/L (ref 20–32)
Calcium: 9.7 mg/dL (ref 8.6–10.4)
Chloride: 104 mmol/L (ref 98–110)
Creat: 1.5 mg/dL — ABNORMAL HIGH (ref 0.50–1.05)
Globulin: 3 g/dL (ref 1.9–3.7)
Glucose, Bld: 97 mg/dL (ref 65–99)
Potassium: 3.8 mmol/L (ref 3.5–5.3)
Sodium: 143 mmol/L (ref 135–146)
Total Bilirubin: 0.7 mg/dL (ref 0.2–1.2)
Total Protein: 7.2 g/dL (ref 6.1–8.1)
eGFR: 39 mL/min/{1.73_m2} — ABNORMAL LOW (ref >=60)

## 2024-05-17 LAB — POCT GLYCOSYLATED HEMOGLOBIN (HGB A1C): Hemoglobin A1C: 7.5 % — AB (ref 4.0–5.6)

## 2024-05-17 MED ORDER — EZETIMIBE 10 MG PO TABS: 10.0000 mg | ORAL_TABLET | Freq: Every day | ORAL | 1 refills | Status: DC

## 2024-05-17 MED ORDER — SEMAGLUTIDE (1 MG/DOSE) 4 MG/3ML ~~LOC~~ SOPN: 1.0000 mg | PEN_INJECTOR | SUBCUTANEOUS | 1 refills | Status: DC

## 2024-05-17 MED ORDER — QULIPTA 30 MG PO TABS: 1.0000 | ORAL_TABLET | ORAL | 1 refills | Status: DC

## 2024-05-17 MED ORDER — ROSUVASTATIN CALCIUM 40 MG PO TABS: 40.0000 mg | ORAL_TABLET | Freq: Every day | ORAL | 1 refills | Status: DC

## 2024-05-17 MED ORDER — CYCLOBENZAPRINE HCL 5 MG PO TABS: 5.0000 mg | ORAL_TABLET | Freq: Every day | ORAL | 1 refills | Status: DC

## 2024-05-17 MED ORDER — GABAPENTIN 300 MG PO CAPS: 300.0000 mg | ORAL_CAPSULE | Freq: Three times a day (TID) | ORAL | 1 refills | Status: DC

## 2024-05-17 MED ORDER — EMPAGLIFLOZIN 25 MG PO TABS: 25.0000 mg | ORAL_TABLET | Freq: Every day | ORAL | 1 refills | Status: DC

## 2024-05-17 NOTE — Progress Notes (Signed)
 Name: Brandy Johnston   MRN: [id number]    DOB: 20-Nov-1961   Date:05/17/2024       Progress Note  Subjective  Chief Complaint  Chief Complaint  Patient presents with   Medical Management of Chronic Issues   Discussed the use of AI scribe software for clinical note transcription with the patient, who gave verbal consent to proceed.  History of Present Illness Brandy Johnston is a 63 year old female with diabetes, hypertrophic cardiomyopathy, and eosinophilic asthma who presents for a regular follow-up visit.  She experiences shortness of breath with physical activity, which is less severe than previous episodes. She is currently taking diltiazem  240 mg for hypertrophic cardiomyopathy.  She has a history of dysphagia and gastroesophageal reflux disease, experiencing difficulty swallowing and a sensation of fullness due to impaired esophageal motility. She is taking pantoprazole  for reflux symptoms.  She has eosinophilic asthma and has completed three Fasenra injections, transitioning to injections every eight weeks. She uses Breo as a maintenance inhaler and albuterol  as a rescue inhaler.  She has type 2 diabetes, with her A1c increasing from 6.5% to 7.5%. She is on Jardiance  and Ozempic . No symptoms of hyperglycemia such as excessive hunger, thirst, or frequent urination. She has associated dyslipidemia, HTN and CKI stage IIIa and also microalbuminuria and takes statin therapy, SGL-2 agonist and bp has been controlled with cardizem  and metoprolol    She experiences migraine headaches approximately every three weeks, lasting about a day, with sensitivity to light and sound but no nausea or vomiting. She uses Qulipta  for prevention and Ubrelvy  for acute attacks.  She takes gabapentin  for neuropathy and cyclobenzaprine  for muscle spasms. She also takes rosuvastatin  and ezetimibe  for dyslipidemia.  She has chronic kidney disease stage 3 and atherosclerosis of the abdominal aorta. Her liver enzymes  were previously elevated, possibly due to medication use, and her white blood cell count was high, likely due to prednisone  use.    Patient Active Problem List   Diagnosis Date Noted   Esophageal dysphagia 05/17/2024   Migraine without aura, intractable, with status migrainosus 05/17/2024   Hypertrophic cardiomyopathy (HCC) 05/17/2024   Dyslipidemia associated with type 2 diabetes mellitus (HCC) 05/17/2024   Hyperglycemia 03/11/2024   Small vessel disease (HCC) 01/26/2024   Eosinophilic asthma 01/18/2024   Muscle spasms of both lower extremities 01/18/2024   Chronic left shoulder pain 11/09/2022   Hyperlipidemia 01/14/2021   Incomplete tear of left rotator cuff 07/03/2020   Angioedema due to angiotensin converting enzyme inhibitor (ACE-I) 01/29/2020   Chronic bilateral back pain 07/26/2017   Coronary artery calcification 05/17/2017   Heart palpitations 05/15/2017   History of acute gastritis    Leukocytosis 09/30/2016   Atherosclerosis of abdominal aorta (HCC) 08/18/2016   Type 2 diabetes mellitus with stage 3 chronic kidney disease and hypertension (HCC) 12/16/2015   Diabetic neuropathy associated with type 2 diabetes mellitus (HCC) 09/18/2015   Insomnia 09/18/2015   Benign essential HTN 06/18/2015   Chronic kidney disease (CKD), stage III (moderate) (HCC) 06/18/2015   Diabetes mellitus with renal manifestation (HCC) 06/18/2015   Elevated CK 06/18/2015   Obesity (BMI 30.0-34.9) 06/18/2015   Degenerative arthritis of hip 06/18/2015   Sickle cell trait (HCC) 06/18/2015   Dyslipidemia 05/27/2010   Gastroesophageal reflux disease 05/25/2008    Past Surgical History:  Procedure Laterality Date   BREAST BIOPSY Right 12/28/2019   stereo [facility] stromal fibrosis   CHOLECYSTECTOMY     COLONOSCOPY     ESOPHAGOGASTRODUODENOSCOPY (EGD) WITH PROPOFOL   N/A 10/05/2016   Procedure: ESOPHAGOGASTRODUODENOSCOPY (EGD) WITH PROPOFOL ;  Surgeon: [name], MD;  Location: [facility];   Service: Endoscopy;  Laterality: N/A;   FRACTURE SURGERY Left    cast and pins    SHOULDER ARTHROSCOPY WITH ROTATOR CUFF REPAIR AND SUBACROMIAL DECOMPRESSION Left 12/07/2018   Procedure: left shoulder manipulation under anesthesia, left shoulder arthroscopic lysis of adhesions;  Surgeon: [name], MD;  Location: [facility];  Service: Orthopedics;  Laterality: Left;   SHOULDER CLOSED REDUCTION Left 12/07/2018   Procedure: CLOSED MANIPULATION SHOULDER;  Surgeon: [name], MD;  Location: [facility];  Service: Orthopedics;  Laterality: Left;   shoulder surgery  Left 06/10/2018   Dr. [name]   TUBAL LIGATION      Family History  Problem Relation Age of Onset   Migraines Mother    Diabetes Mother    Cancer Mother        lung   Arthritis Brother    Breast cancer Maternal Aunt    Cancer Maternal Uncle        Lung and Colon   Cirrhosis Brother    Breast cancer Cousin     Social History   Tobacco Use   Smoking status: Never   Smokeless tobacco: Never  Substance Use Topics   Alcohol use: Yes    Alcohol/week: 0.0 standard drinks of alcohol    Comment: rare     Current Outpatient Medications:    albuterol  (VENTOLIN  HFA) 108 (90 Base) MCG/ACT inhaler, SMARTSIG:2 Puff(s) By Mouth Every 4-6 Hours PRN, Disp: , Rfl:    aspirin  (ASPIRIN  81) 81 MG chewable tablet, Chew 1 tablet (81 mg total) by mouth daily., Disp: 30 tablet, Rfl: 0   benralizumab (FASENRA PEN) 30 MG/ML prefilled autoinjector, Inject into the skin., Disp: , Rfl:    Blood Glucose Monitoring Suppl (CONTOUR NEXT ONE) KIT, USE TO TEST BLOOD SUGAR ONCE D, Disp: , Rfl:    Cholecalciferol (VITAMIN D ) 2000 units CAPS, Take 1 capsule (2,000 Units total) by mouth daily., Disp: 30 capsule, Rfl: 0   Continuous Glucose Sensor (FREESTYLE LIBRE 3 SENSOR) MISC, 1 Device by Does not apply route every 14 (fourteen) days., Disp: 6 each, Rfl: 1   diclofenac  Sodium (VOLTAREN ) 1 % GEL, Apply 2 g topically 4 (four) times daily., Disp: 100  g, Rfl: 2   diltiazem  (CARDIZEM  CD) 240 MG 24 hr capsule, Take 1 capsule by mouth daily., Disp: , Rfl:    EPINEPHrine  0.3 mg/0.3 mL IJ SOAJ injection, Inject into the muscle., Disp: , Rfl:    fluticasone  (FLONASE) 50 MCG/ACT nasal spray, 2 sprays each nostril Daily. DISP#  1 bottles = 1 month supply., Disp: , Rfl:    fluticasone  furoate-vilanterol (BREO ELLIPTA ) 100-25 MCG/ACT AEPB, Inhale 1 puff into the lungs daily., Disp: , Rfl:    glucose blood test strip, Use as instructed, Disp: 100 each, Rfl: 12   insulin  NPH Human (NOVOLIN N) 100 UNIT/ML injection, Inject into the skin., Disp: , Rfl:    ipratropium-albuterol  (DUONEB) 0.5-2.5 (3) MG/3ML SOLN, Take 3 mLs by nebulization every 6 (six) hours as needed., Disp: 360 mL, Rfl: 1   loratadine  (CLARITIN ) 10 MG tablet, Take 1 tablet (10 mg total) by mouth daily., Disp: 90 tablet, Rfl: 0   magnesium  oxide (MAG-OX) 400 MG tablet, Take 1 tablet (400 mg total) by mouth 2 (two) times daily., Disp: 180 tablet, Rfl: 0   metoprolol  succinate (TOPROL -XL) 25 MG 24 hr tablet, Take 0.5 tablets (12.5 mg  total) by mouth daily. Takes 1/2 tablet, Disp: 45 tablet, Rfl: 0   Microlet Lancets MISC, USE TO CHECK BLOOD SUGAR ONCE D, Disp: , Rfl:    montelukast (SINGULAIR) 10 MG tablet, Take by mouth., Disp: , Rfl:    ondansetron  (ZOFRAN -ODT) 4 MG disintegrating tablet, Take 1 tablet (4 mg total) by mouth every 8 (eight) hours as needed for nausea., Disp: 30 tablet, Rfl: 0   pantoprazole  (PROTONIX ) 40 MG tablet, Take 1 tablet (40 mg total) by mouth 2 (two) times daily., Disp: 180 tablet, Rfl: 0   Semaglutide , 1 MG/DOSE, 4 MG/3ML SOPN, Inject 1 mg as directed once a week., Disp: 9 mL, Rfl: 1   Sharps Container (BD SHARPS COLLECTOR) MISC, Use as directed to dispose of Fasenra pen, Disp: , Rfl:    sodium chloride  0.9 % nebulizer solution, Inhale into the lungs., Disp: , Rfl:    Spacer/Aero-Holding Chambers (OPTICHAMBER DIAMOND) MISC, , Disp: , Rfl:    Ubrogepant  (UBRELVY ) 100  MG TABS, Take 1 tablet (100 mg total) by mouth daily as needed., Disp: 48 tablet, Rfl: 0   Atogepant  (QULIPTA ) 30 MG TABS, Take 1 tablet (30 mg total) by mouth every morning., Disp: 90 tablet, Rfl: 1   cyclobenzaprine  (FLEXERIL ) 5 MG tablet, Take 1 tablet (5 mg total) by mouth at bedtime., Disp: 90 tablet, Rfl: 1   empagliflozin  (JARDIANCE ) 25 MG TABS tablet, Take 1 tablet (25 mg total) by mouth daily before breakfast., Disp: 90 tablet, Rfl: 1   ezetimibe  (ZETIA ) 10 MG tablet, Take 1 tablet (10 mg total) by mouth daily., Disp: 90 tablet, Rfl: 1   gabapentin  (NEURONTIN ) 300 MG capsule, Take 1 capsule (300 mg total) by mouth 3 (three) times daily., Disp: 270 capsule, Rfl: 1   rosuvastatin  (CRESTOR ) 40 MG tablet, Take 1 tablet (40 mg total) by mouth daily., Disp: 90 tablet, Rfl: 1  Allergies  Allergen Reactions   Ace Inhibitors Swelling    Angioedema    Lisinopril  Swelling    Face and neck swelling   Quetiapine     confusion    I personally reviewed active problem list, medication list, allergies, family history with the patient/caregiver today.   ROS  Ten systems reviewed and is negative except as mentioned in HPI    Objective Physical Exam Constitutional: Patient appears well-developed and well-nourished. Obese  No distress.  HEENT: head atraumatic, normocephalic, pupils equal and reactive to light, neck supple Cardiovascular: Normal rate, regular rhythm and normal heart sounds.  No murmur heard. No BLE edema. Pulmonary/Chest: Effort normal and breath sounds normal. No respiratory distress. Abdominal: Soft.  There is no tenderness. Psychiatric: Patient has a normal mood and affect. behavior is normal. Judgment and thought content normal.    CONSTITUTIONAL: Patient appears well-developed and well-nourished. No distress. HEENT: Head atraumatic, normocephalic, neck supple. CARDIOVASCULAR: Normal rate, regular rhythm and normal heart sounds. No murmur heard. No BLE edema. Bruises on  right outer foot, hyperpigmentation noted. PULMONARY: Effort normal and breath sounds normal. No respiratory distress. ABDOMINAL: There is no tenderness or distention. MUSCULOSKELETAL: Normal gait. Without gross motor or sensory deficit. PSYCHIATRIC: Patient has a normal mood and affect. Behavior is normal. Judgment and thought content normal.  Vitals:   05/17/24 1102  BP: 112/72  Pulse: 93  Resp: 16  SpO2: 95%  Weight: 159 lb 12.8 oz (72.5 kg)  Height: 5' (1.524 m)    Body mass index is 31.21 kg/m.  Recent Results (from the past 2160 hours)  COMPLETE METABOLIC  PANEL WITH GFR     Status: Abnormal   Collection Time: 03/17/24  3:07 PM  Result Value Ref Range   Glucose, Bld 220 (H) 65 - 99 mg/dL    Comment: .            Fasting reference interval . For someone without known diabetes, a glucose value >125 mg/dL indicates that they may have diabetes and this should be confirmed with a follow-up test. .    BUN 18 7 - 25 mg/dL   Creat 1.61 (H) 0.96 - 1.05 mg/dL   BUN/Creatinine Ratio 10 6 - 22 (calc)   Sodium 137 135 - 146 mmol/L   Potassium 4.5 3.5 - 5.3 mmol/L   Chloride 101 98 - 110 mmol/L   CO2 25 20 - 32 mmol/L   Calcium  9.6 8.6 - 10.4 mg/dL   Total Protein 7.3 6.1 - 8.1 g/dL   Albumin 4.4 3.6 - 5.1 g/dL   Globulin 2.9 1.9 - 3.7 g/dL (calc)   AG Ratio 1.5 1.0 - 2.5 (calc)   Total Bilirubin 0.9 0.2 - 1.2 mg/dL   Alkaline phosphatase (APISO) 177 (H) 37 - 153 U/L   AST 49 (H) 10 - 35 U/L   ALT 69 (H) 6 - 29 U/L  Hepatitis, Acute     Status: None   Collection Time: 03/22/24  1:42 PM  Result Value Ref Range   Hep A IgM NON-REACTIVE NON-REACTIVE   Hepatitis B Surface Ag NON-REACTIVE NON-REACTIVE   Hep B C IgM NON-REACTIVE NON-REACTIVE   Hepatitis C Ab NON-REACTIVE NON-REACTIVE    Comment: . HCV antibody was non-reactive. There is no laboratory  evidence of HCV infection. . In most cases, no further action is required. However, if recent HCV exposure is suspected,  a test for HCV RNA (test code 04540) is suggested. . For additional information please refer to http://education.[facility].com/faq/FAQ22v1 (This link is being provided for informational/ educational purposes only.) . Aaron Aas For additional information, please refer to  http://education.[facility].com/faq/FAQ202  (This link is being provided for informational/ educational purposes only.) . Aaron Aas For additional information, please refer to  http://education.[facility].com/faq/FAQ202  (This link is being provided for informational/ educational purposes only.) . Aaron Aas For additional information, please refer to  http://education.[facility].com/faq/FAQ202  (This link is being provided for informational/ educational purposes only.) .   COMPLETE METABOLIC PANEL WITH GFR     Status: Abnormal   Collection Time: 03/22/24  1:42 PM  Result Value Ref Range   Glucose, Bld 228 (H) 65 - 99 mg/dL    Comment: .            Fasting reference interval . For someone without known diabetes, a glucose value >125 mg/dL indicates that they may have diabetes and this should be confirmed with a follow-up test. .    BUN 14 7 - 25 mg/dL   Creat 9.81 (H) 1.91 - 1.05 mg/dL   BUN/Creatinine Ratio 8 6 - 22 (calc)   Sodium 135 135 - 146 mmol/L   Potassium 4.0 3.5 - 5.3 mmol/L   Chloride 99 98 - 110 mmol/L   CO2 29 20 - 32 mmol/L   Calcium  8.9 8.6 - 10.4 mg/dL   Total Protein 6.2 6.1 - 8.1 g/dL   Albumin 3.9 3.6 - 5.1 g/dL   Globulin 2.3 1.9 - 3.7 g/dL (calc)   AG Ratio 1.7 1.0 - 2.5 (calc)   Total Bilirubin 0.7 0.2 - 1.2 mg/dL   Alkaline phosphatase (APISO) 212 (H) 37 - 153  U/L   AST 47 (H) 10 - 35 U/L   ALT 63 (H) 6 - 29 U/L  CBC with Differential/Platelet     Status: Abnormal   Collection Time: 03/22/24  1:42 PM  Result Value Ref Range   WBC 9.8 3.8 - 10.8 Thousand/uL   RBC 4.50 3.80 - 5.10 Million/uL   Hemoglobin 12.7 11.7 - 15.5 g/dL   HCT 96.0 45.4 - 09.8 %   MCV 83.8 80.0  - 100.0 fL   MCH 28.2 27.0 - 33.0 pg   MCHC 33.7 32.0 - 36.0 g/dL    Comment: For adults, a slight decrease in the calculated MCHC value (in the range of 30 to 32 g/dL) is most likely not clinically significant; however, it should be interpreted with caution in correlation with other red cell parameters and the patient's clinical condition.    RDW 15.2 (H) 11.0 - 15.0 %   Platelets 119 (L) 140 - 400 Thousand/uL   MPV 11.2 7.5 - 12.5 fL   Neutro Abs 7,526 1,500 - 7,800 cells/uL   Absolute Lymphocytes 1,735 850 - 3,900 cells/uL   Absolute Monocytes 510 200 - 950 cells/uL   Eosinophils Absolute 0 (L) 15 - 500 cells/uL   Basophils Absolute 29 0 - 200 cells/uL   Neutrophils Relative % 76.8 %   Total Lymphocyte 17.7 %   Monocytes Relative 5.2 %   Eosinophils Relative 0.0 %   Basophils Relative 0.3 %   Smear Review      Comment: Review of peripheral smear confirms automated results.   POCT glycosylated hemoglobin (Hb A1C)     Status: Abnormal   Collection Time: 05/17/24 11:08 AM  Result Value Ref Range   Hemoglobin A1C 7.5 (A) 4.0 - 5.6 %   HbA1c POC (<> result, manual entry)     HbA1c, POC (prediabetic range)     HbA1c, POC (controlled diabetic range)      Diabetic Foot Exam:  Diabetic Foot Exam - Simple   Simple Foot Form Visual Inspection See comments: Yes Sensation Testing Intact to touch and monofilament testing bilaterally: Yes Pulse Check Posterior Tibialis and Dorsalis pulse intact bilaterally: Yes Comments       PHQ2/9:    05/17/2024   10:53 AM 05/05/2024   11:04 AM 03/17/2024    2:03 PM 02/04/2024   10:09 AM 01/18/2024   11:27 AM  Depression screen PHQ 2/9  Decreased Interest 0 0 0 0 0  Down, Depressed, Hopeless 0 0 0 0 0  PHQ - 2 Score 0 0 0 0 0  Altered sleeping 0 0 0 0 0  Tired, decreased energy 0 0 0 0 0  Change in appetite 0 0 0 0 0  Feeling bad or failure about yourself  0 0 0 0 0  Trouble concentrating 0 0 0 0 0  Moving slowly or fidgety/restless  0 0 0 0 0  Suicidal thoughts 0 0 0 0 0  PHQ-9 Score 0 0 0 0 0  Difficult doing work/chores Not difficult at all Not difficult at all Not difficult at all Not difficult at all Not difficult at all    phq 9 is negative  Fall Risk:    05/17/2024   10:52 AM 05/05/2024   10:59 AM 04/12/2024   12:12 PM 04/05/2024   11:07 AM 01/18/2024   11:17 AM  Fall Risk   Falls in the past year? 0 0 0 0 0  Number falls in past yr: 0 0 0  0 0  Injury with Fall? 0 0   0  Risk for fall due to : No Fall Risks No Fall Risks Impaired mobility;Impaired balance/gait;Medication side effect  No Fall Risks  Follow up Falls prevention discussed;Education provided;Falls evaluation completed Falls prevention discussed;Falls evaluation completed Falls prevention discussed  Falls prevention discussed;Education provided;Falls evaluation completed     Assessment and Plan Assessment & Plan Type 2 diabetes mellitus with dyslipidemia/HTN A1c increased from 6.5 to 7.5, likely due to recent illness and prednisone  use. Currently asymptomatic for hyperglycemia. - Increase Ozempic  dose from 0.5 mg to 1 mg weekly. - Continue Jardiance  25 mg daily. - Advise on lifestyle modifications, including reducing carbohydrate and sweet beverage intake.  Hypertrophic cardiomyopathy with obstruction Asymmetrical basal septal hypertrophy with left ventricular outflow tract obstruction. Recent cardiac MRI showed left ventricle wall thickening but structurally normal heart and pulmonary artery. - Continue current medication regimen to manage heart rate.  Eosinophilic asthma Managed with Fasenra injections, transitioning from monthly to every eight weeks. Also using Breo Ellipta  daily and albuterol  as a rescue inhaler. - Continue Fasenra injections every eight weeks. - Continue Breo Ellipta  daily. - Use albuterol  as needed for acute symptoms.  Dysphagia and gastroesophageal reflux disease (GERD) Scheduled for EGD to evaluate dysphagia and GERD.  Currently on pantoprazole  and advised on dietary modifications. - Continue pantoprazole  until EGD results are available. - Undergo EGD for further evaluation. - Follow dietary modifications to manage reflux symptoms.  Chronic kidney disease, stage 3 associated with DM type 2 also has microalbuminuria  Chronic condition with recent labs showing elevated liver enzymes, possibly due to recent illness and medication use. - Order comprehensive metabolic panel to monitor kidney function and liver enzymes. - Monitor GFR and proteinuria.  Hyperlipidemia Managed with rosuvastatin  and ezetimibe . LDL is well-controlled at 60 mg/dL. - Continue rosuvastatin  40 mg daily. - Continue ezetimibe  10 mg daily.  Atherosclerosis of the abdominal aorta/Small Vessel disease Managed with cholesterol-lowering medications and blood pressure control. Maintaining LDL below 70 mg/dL and blood pressure under control is crucial. - Continue current cholesterol management with rosuvastatin  and ezetimibe . - Maintain blood pressure control.  Peripheral neuropathy Experiencing neuropathic pain, managed with gabapentin  and cyclobenzaprine  for muscle spasms. Reports bruising on right foot, possibly due to pressure from footwear. - Continue gabapentin  three times daily. - Continue cyclobenzaprine  at night for muscle spasms.  Migraine without aura Infrequent migraines, last episode three weeks ago, managed with Ubrelvy  as needed. No associated nausea or vomiting, but light and sound sensitivity present. - Continue Qulipta  for migraine prevention. - Use Ubrelvy  as needed for acute migraine episodes.  Follow-up Plan established to monitor chronic conditions and medication efficacy. - Schedule follow-up appointment in four months. - Send all prescriptions to [facility] for six months supply.

## 2024-05-18 ENCOUNTER — Ambulatory Visit: Payer: Self-pay | Admitting: Family Medicine

## 2024-05-20 ENCOUNTER — Other Ambulatory Visit: Payer: Self-pay | Admitting: Family Medicine

## 2024-05-20 DIAGNOSIS — R790 Abnormal level of blood mineral: Secondary | ICD-10-CM

## 2024-05-20 DIAGNOSIS — K219 Gastro-esophageal reflux disease without esophagitis: Secondary | ICD-10-CM

## 2024-05-23 ENCOUNTER — Other Ambulatory Visit: Payer: Self-pay | Admitting: Family Medicine

## 2024-05-23 ENCOUNTER — Telehealth: Payer: Self-pay

## 2024-05-23 MED ORDER — LEVOCETIRIZINE DIHYDROCHLORIDE 5 MG PO TABS: 5.0000 mg | ORAL_TABLET | Freq: Every evening | ORAL | 1 refills | Status: DC

## 2024-05-23 NOTE — Telephone Encounter (Signed)
 Copied from CRM [id number]. Topic: Clinical - Prescription Issue >> May 23, 2024 11:38 AM Lizabeth Riggs wrote: Reason for CRM:  [name] insurance will not approve loratadine  (CLARITIN ) 10 MG tablet. Please change to a different medication.  Please send message through MyChart if there is a problem. >> May 23, 2024 11:43 AM Lizabeth Riggs wrote: Brandy Johnston's want to know if she should take montelukast (SINGULAIR) 10 MG tablet with another allergy medicine. Please let her know through MyChart. Thanks

## 2024-05-24 DIAGNOSIS — K2100 Gastro-esophageal reflux disease with esophagitis, without bleeding: Secondary | ICD-10-CM | POA: Diagnosis not present

## 2024-05-24 DIAGNOSIS — K219 Gastro-esophageal reflux disease without esophagitis: Secondary | ICD-10-CM | POA: Diagnosis not present

## 2024-05-24 DIAGNOSIS — E1142 Type 2 diabetes mellitus with diabetic polyneuropathy: Secondary | ICD-10-CM | POA: Diagnosis not present

## 2024-05-24 DIAGNOSIS — E1122 Type 2 diabetes mellitus with diabetic chronic kidney disease: Secondary | ICD-10-CM | POA: Diagnosis not present

## 2024-05-24 DIAGNOSIS — I129 Hypertensive chronic kidney disease with stage 1 through stage 4 chronic kidney disease, or unspecified chronic kidney disease: Secondary | ICD-10-CM | POA: Diagnosis not present

## 2024-05-24 DIAGNOSIS — N183 Chronic kidney disease, stage 3 unspecified: Secondary | ICD-10-CM | POA: Diagnosis not present

## 2024-05-24 DIAGNOSIS — Z794 Long term (current) use of insulin: Secondary | ICD-10-CM | POA: Diagnosis not present

## 2024-05-24 DIAGNOSIS — D573 Sickle-cell trait: Secondary | ICD-10-CM | POA: Diagnosis not present

## 2024-05-24 DIAGNOSIS — E7800 Pure hypercholesterolemia, unspecified: Secondary | ICD-10-CM | POA: Diagnosis not present

## 2024-05-24 DIAGNOSIS — K3189 Other diseases of stomach and duodenum: Secondary | ICD-10-CM | POA: Diagnosis not present

## 2024-05-24 DIAGNOSIS — R1310 Dysphagia, unspecified: Secondary | ICD-10-CM | POA: Diagnosis not present

## 2024-05-26 ENCOUNTER — Telehealth: Payer: Self-pay | Admitting: Family Medicine

## 2024-05-26 NOTE — Telephone Encounter (Signed)
 Copied from CRM [id number]. Topic: Clinical - Prescription Issue >> May 26, 2024 12:10 PM Brandy Johnston wrote: Reason for CRM: Patient states the pharmacy is stating that they did not receive the refill request for levocetirizine (XYZAL ) 5 MG tablet. Patient is asking if it could please be resent.   [facility] - [address], [place] - [address] [place] [address] Phone: [phone] Fax: [phone]  Patient can be reached at [phone]

## 2024-05-29 MED ORDER — LEVOCETIRIZINE DIHYDROCHLORIDE 5 MG PO TABS: 5.0000 mg | ORAL_TABLET | Freq: Every evening | ORAL | 1 refills | Status: DC

## 2024-05-29 NOTE — Telephone Encounter (Signed)
 Requested Prescriptions  Pending Prescriptions Disp Refills   levocetirizine (XYZAL ) 5 MG tablet 90 tablet 1    Sig: Take 1 tablet (5 mg total) by mouth every evening.     Ear, Nose, and Throat:  Antihistamines - levocetirizine dihydrochloride  Failed - 05/29/2024  8:10 AM      Failed - Cr in normal range and within 360 days    Creat  Date Value Ref Range Status  05/17/2024 1.50 (H) 0.50 - 1.05 mg/dL Final   Creatinine, Urine  Date Value Ref Range Status  01/18/2024 135 20 - 275 mg/dL Final         Passed - eGFR is 10 or above and within 360 days    GFR, Est African American  Date Value Ref Range Status  05/16/2021 56 (L) > OR = 60 mL/min/1.48m2 Final   GFR, Est Non African American  Date Value Ref Range Status  05/16/2021 48 (L) > OR = 60 mL/min/1.62m2 Final   GFR, Estimated  Date Value Ref Range Status  10/15/2023 49 (L) >60 mL/min Final    Comment:    (NOTE) Calculated using the CKD-EPI Creatinine Equation (2021)    eGFR  Date Value Ref Range Status  05/17/2024 39 (L) > OR = 60 mL/min/1.20m2 Final         Passed - Valid encounter within last 12 months    Recent Outpatient Visits           1 week ago Dyslipidemia associated with type 2 diabetes mellitus [facility])   [facility] [name], MD   2 months ago Elevated liver enzymes   [facility] [name], MD   2 months ago Hospital discharge follow-up   [facility] [name], MD   3 months ago Eosinophilic asthma   [facility] [name], MD   3 months ago Chronic maxillary sinusitis   [facility] [name], MD       Future Appointments             In 3 months Ava Lei, Krichna, MD [facility]

## 2024-06-09 ENCOUNTER — Encounter: Payer: Self-pay | Admitting: Family Medicine

## 2024-06-09 ENCOUNTER — Ambulatory Visit: Admitting: Family Medicine

## 2024-06-09 VITALS — BP 108/64 | HR 84 | Temp 98.2°F | Ht 60.0 in | Wt 153.4 lb

## 2024-06-09 DIAGNOSIS — E1122 Type 2 diabetes mellitus with diabetic chronic kidney disease: Secondary | ICD-10-CM

## 2024-06-09 DIAGNOSIS — Z7985 Long-term (current) use of injectable non-insulin antidiabetic drugs: Secondary | ICD-10-CM

## 2024-06-09 DIAGNOSIS — M545 Low back pain, unspecified: Secondary | ICD-10-CM

## 2024-06-09 DIAGNOSIS — T887XXA Unspecified adverse effect of drug or medicament, initial encounter: Secondary | ICD-10-CM | POA: Diagnosis not present

## 2024-06-09 DIAGNOSIS — I129 Hypertensive chronic kidney disease with stage 1 through stage 4 chronic kidney disease, or unspecified chronic kidney disease: Secondary | ICD-10-CM

## 2024-06-09 DIAGNOSIS — N390 Urinary tract infection, site not specified: Secondary | ICD-10-CM | POA: Diagnosis not present

## 2024-06-09 DIAGNOSIS — R630 Anorexia: Secondary | ICD-10-CM

## 2024-06-09 DIAGNOSIS — N183 Chronic kidney disease, stage 3 unspecified: Secondary | ICD-10-CM

## 2024-06-09 LAB — POCT URINALYSIS DIPSTICK
Bilirubin, UA: NEGATIVE
Blood, UA: NEGATIVE
Glucose, UA: POSITIVE — AB
Leukocytes, UA: NEGATIVE
Nitrite, UA: NEGATIVE
Odor: NORMAL
Protein, UA: POSITIVE — AB
Spec Grav, UA: 1.01 (ref 1.010–1.025)
Urobilinogen, UA: 1 U/dL
pH, UA: 7.5 (ref 5.0–8.0)

## 2024-06-09 MED ORDER — BACLOFEN 5 MG PO TABS: 5.0000 mg | ORAL_TABLET | Freq: Three times a day (TID) | ORAL | 0 refills | Status: AC | PRN

## 2024-06-09 MED ORDER — OZEMPIC (0.25 OR 0.5 MG/DOSE) 2 MG/3ML ~~LOC~~ SOPN: 0.5000 mg | PEN_INJECTOR | SUBCUTANEOUS | 0 refills | Status: DC

## 2024-06-09 NOTE — Progress Notes (Signed)
 Patient ID: Brandy Johnston, female    DOB: 1961-11-27, 63 y.o.   MRN: [id number]  PCP: Sowles, Krichna, MD  Chief Complaint  Patient presents with   Medication Problem    Loss of appetite, onset 3 weeks. May be due to ozempic  dose increase. States dr Ava Lei increased then decreased dosage and she has not had an appetite    Back Pain    Onset 5 days, used lidocaine  patches not helping. Mid lower back 9/10 and sometimes 10/10 pain. Was wondering if this could be UTI, no urinary related syx     Subjective:   Brandy Johnston is a 63 y.o. female, presents to clinic with CC of the following:  HPI   Monday ozempic  dosing increased to 1 mg dose which she first did on May 26 No appetite lost some weight CGM shows low blood sugar but glucometer shows normal sugars 80-90's  Wt Readings from Last 5 Encounters:  06/09/24 153 lb 6.4 oz (69.6 kg)  05/17/24 159 lb 12.8 oz (72.5 kg)  05/05/24 157 lb (71.2 kg)  04/12/24 159 lb (72.1 kg)  04/05/24 160 lb (72.6 kg)   BMI Readings from Last 5 Encounters:  06/09/24 29.96 kg/m  05/17/24 31.21 kg/m  05/05/24 30.66 kg/m  04/12/24 31.05 kg/m  04/05/24 31.25 kg/m   She also has low back pain x 3 weeks, no injury No abd pain urinary sx, weakness in legs, fever, sweats     Patient Active Problem List   Diagnosis Date Noted   Esophageal dysphagia 05/17/2024   Migraine without aura, intractable, with status migrainosus 05/17/2024   Hypertrophic cardiomyopathy (HCC) 05/17/2024   Dyslipidemia associated with type 2 diabetes mellitus (HCC) 05/17/2024   Hyperglycemia 03/11/2024   Small vessel disease (HCC) 01/26/2024   Eosinophilic asthma 01/18/2024   Muscle spasms of both lower extremities 01/18/2024   Chronic left shoulder pain 11/09/2022   Hyperlipidemia 01/14/2021   Incomplete tear of left rotator cuff 07/03/2020   Angioedema due to angiotensin converting enzyme inhibitor (ACE-I) 01/29/2020   Chronic bilateral back pain 07/26/2017    Coronary artery calcification 05/17/2017   Heart palpitations 05/15/2017   History of acute gastritis    Leukocytosis 09/30/2016   Atherosclerosis of abdominal aorta (HCC) 08/18/2016   Type 2 diabetes mellitus with stage 3 chronic kidney disease and hypertension (HCC) 12/16/2015   Diabetic neuropathy associated with type 2 diabetes mellitus (HCC) 09/18/2015   Insomnia 09/18/2015   Benign essential HTN 06/18/2015   Chronic kidney disease (CKD), stage III (moderate) (HCC) 06/18/2015   Diabetes mellitus with renal manifestation (HCC) 06/18/2015   Elevated CK 06/18/2015   Obesity (BMI 30.0-34.9) 06/18/2015   Degenerative arthritis of hip 06/18/2015   Sickle cell trait (HCC) 06/18/2015   Dyslipidemia 05/27/2010   Gastroesophageal reflux disease 05/25/2008      Current Outpatient Medications:    albuterol  (VENTOLIN  HFA) 108 (90 Base) MCG/ACT inhaler, SMARTSIG:2 Puff(s) By Mouth Every 4-6 Hours PRN, Disp: , Rfl:    aspirin  (ASPIRIN  81) 81 MG chewable tablet, Chew 1 tablet (81 mg total) by mouth daily., Disp: 30 tablet, Rfl: 0   Atogepant  (QULIPTA ) 30 MG TABS, Take 1 tablet (30 mg total) by mouth every morning., Disp: 90 tablet, Rfl: 1   benralizumab (FASENRA PEN) 30 MG/ML prefilled autoinjector, Inject into the skin., Disp: , Rfl:    Blood Glucose Monitoring Suppl (CONTOUR NEXT ONE) KIT, USE TO TEST BLOOD SUGAR ONCE D, Disp: , Rfl:    Cholecalciferol (VITAMIN  D) 2000 units CAPS, Take 1 capsule (2,000 Units total) by mouth daily., Disp: 30 capsule, Rfl: 0   Continuous Glucose Sensor (FREESTYLE LIBRE 3 SENSOR) MISC, 1 Device by Does not apply route every 14 (fourteen) days., Disp: 6 each, Rfl: 1   cyclobenzaprine  (FLEXERIL ) 5 MG tablet, Take 1 tablet (5 mg total) by mouth at bedtime., Disp: 90 tablet, Rfl: 1   diclofenac  Sodium (VOLTAREN ) 1 % GEL, Apply 2 g topically 4 (four) times daily., Disp: 100 g, Rfl: 2   diltiazem  (CARDIZEM  CD) 240 MG 24 hr capsule, Take 1 capsule by mouth daily., Disp:  , Rfl:    empagliflozin  (JARDIANCE ) 25 MG TABS tablet, Take 1 tablet (25 mg total) by mouth daily before breakfast., Disp: 90 tablet, Rfl: 1   EPINEPHrine  0.3 mg/0.3 mL IJ SOAJ injection, Inject into the muscle., Disp: , Rfl:    ezetimibe  (ZETIA ) 10 MG tablet, Take 1 tablet (10 mg total) by mouth daily., Disp: 90 tablet, Rfl: 1   fluticasone  (FLONASE) 50 MCG/ACT nasal spray, 2 sprays each nostril Daily. DISP#  1 bottles = 1 month supply., Disp: , Rfl:    fluticasone  furoate-vilanterol (BREO ELLIPTA ) 100-25 MCG/ACT AEPB, Inhale 1 puff into the lungs daily., Disp: , Rfl:    gabapentin  (NEURONTIN ) 300 MG capsule, Take 1 capsule (300 mg total) by mouth 3 (three) times daily., Disp: 270 capsule, Rfl: 1   glucose blood test strip, Use as instructed, Disp: 100 each, Rfl: 12   insulin  NPH Human (NOVOLIN N) 100 UNIT/ML injection, Inject into the skin., Disp: , Rfl:    ipratropium-albuterol  (DUONEB) 0.5-2.5 (3) MG/3ML SOLN, Take 3 mLs by nebulization every 6 (six) hours as needed., Disp: 360 mL, Rfl: 1   levocetirizine (XYZAL ) 5 MG tablet, Take 1 tablet (5 mg total) by mouth every evening., Disp: 90 tablet, Rfl: 1   magnesium  oxide (MAG-OX) 400 MG tablet, Take 1 tablet (400 mg total) by mouth 2 (two) times daily., Disp: 180 tablet, Rfl: 0   metoprolol  succinate (TOPROL -XL) 25 MG 24 hr tablet, Take 0.5 tablets (12.5 mg total) by mouth daily. Takes 1/2 tablet, Disp: 45 tablet, Rfl: 0   Microlet Lancets MISC, USE TO CHECK BLOOD SUGAR ONCE D, Disp: , Rfl:    montelukast (SINGULAIR) 10 MG tablet, Take by mouth., Disp: , Rfl:    ondansetron  (ZOFRAN -ODT) 4 MG disintegrating tablet, Take 1 tablet (4 mg total) by mouth every 8 (eight) hours as needed for nausea., Disp: 30 tablet, Rfl: 0   pantoprazole  (PROTONIX ) 40 MG tablet, Take 1 tablet (40 mg total) by mouth 2 (two) times daily., Disp: 180 tablet, Rfl: 0   rosuvastatin  (CRESTOR ) 40 MG tablet, Take 1 tablet (40 mg total) by mouth daily., Disp: 90 tablet, Rfl: 1    Semaglutide , 1 MG/DOSE, 4 MG/3ML SOPN, Inject 1 mg as directed once a week. (Patient taking differently: Inject 0.5 mg as directed once a week.), Disp: 9 mL, Rfl: 1   Sharps Container (BD SHARPS COLLECTOR) MISC, Use as directed to dispose of Fasenra pen, Disp: , Rfl:    sodium chloride  0.9 % nebulizer solution, Inhale into the lungs., Disp: , Rfl:    Spacer/Aero-Holding Chambers (OPTICHAMBER DIAMOND) MISC, , Disp: , Rfl:    Ubrogepant  (UBRELVY ) 100 MG TABS, Take 1 tablet (100 mg total) by mouth daily as needed., Disp: 48 tablet, Rfl: 0   Allergies  Allergen Reactions   Ace Inhibitors Swelling    Angioedema    Lisinopril  Swelling  Face and neck swelling   Quetiapine     confusion     Social History   Tobacco Use   Smoking status: Never   Smokeless tobacco: Never  Vaping Use   Vaping status: Never Used  Substance Use Topics   Alcohol use: Yes    Alcohol/week: 0.0 standard drinks of alcohol    Comment: rare   Drug use: Yes    Types: Marijuana    Comment: smokes marijuana occasionally      Chart Review Today: I personally reviewed active problem list, medication list, allergies, family history, social history, health maintenance, notes from last encounter, lab results, imaging with the patient/caregiver today.   Review of Systems  Constitutional: Negative.   HENT: Negative.    Eyes: Negative.   Respiratory: Negative.    Cardiovascular: Negative.   Gastrointestinal: Negative.   Endocrine: Negative.   Genitourinary: Negative.   Musculoskeletal: Negative.   Skin: Negative.   Allergic/Immunologic: Negative.   Neurological: Negative.   Hematological: Negative.   Psychiatric/Behavioral: Negative.    All other systems reviewed and are negative.      Objective:   Vitals:   06/09/24 1111  BP: 108/64  Pulse: 84  Temp: 98.2 F (36.8 C)  TempSrc: Oral  SpO2: 97%  Weight: 153 lb 6.4 oz (69.6 kg)  Height: 5' (1.524 m)    Body mass index is 29.96  kg/m.  Physical Exam Vitals and nursing note reviewed.  Constitutional:      General: She is not in acute distress.    Appearance: Normal appearance. She is well-developed. She is not ill-appearing, toxic-appearing or diaphoretic.  HENT:     Head: Normocephalic and atraumatic.     Right Ear: External ear normal.     Left Ear: External ear normal.     Nose: Nose normal.   Eyes:     General: No scleral icterus.       Right eye: No discharge.        Left eye: No discharge.     Conjunctiva/sclera: Conjunctivae normal.   Neck:     Trachea: No tracheal deviation.   Cardiovascular:     Rate and Rhythm: Normal rate and regular rhythm.     Pulses: Normal pulses.     Heart sounds: Normal heart sounds.  Pulmonary:     Effort: Pulmonary effort is normal. No respiratory distress.     Breath sounds: No stridor.  Abdominal:     General: There is no distension.     Palpations: Abdomen is soft.     Tenderness: There is no abdominal tenderness. There is no right CVA tenderness, left CVA tenderness, guarding or rebound.   Musculoskeletal:     Comments: No midline tenderness from cervical to lumbar spine, no step off No paraspinal muscle ttp from cervical to thoracic spine, mild right lumbar paraspinal ttp Grossly normal ROM of back Grossly normal sensation to light touch to bilateral lower extremities normal gait     Skin:    General: Skin is warm and dry.     Findings: No rash.   Neurological:     Mental Status: She is alert.     Motor: No abnormal muscle tone.     Coordination: Coordination normal.     Gait: Gait normal.   Psychiatric:        Mood and Affect: Mood normal.        Behavior: Behavior normal.      Results for orders placed or performed  in visit on 05/17/24  POCT glycosylated hemoglobin (Hb A1C)   Collection Time: 05/17/24 11:08 AM  Result Value Ref Range   Hemoglobin A1C 7.5 (A) 4.0 - 5.6 %   HbA1c POC (<> result, manual entry)     HbA1c, POC (prediabetic  range)     HbA1c, POC (controlled diabetic range)    CBC with Differential/Platelet   Collection Time: 05/17/24 11:49 AM  Result Value Ref Range   WBC 11.6 (H) 3.8 - 10.8 Thousand/uL   RBC 4.56 3.80 - 5.10 Million/uL   Hemoglobin 12.6 11.7 - 15.5 g/dL   HCT 09.8 11.9 - 14.7 %   MCV 86.6 80.0 - 100.0 fL   MCH 27.6 27.0 - 33.0 pg   MCHC 31.9 (L) 32.0 - 36.0 g/dL   RDW 82.9 56.2 - 13.0 %   Platelets 266 140 - 400 Thousand/uL   MPV 10.6 7.5 - 12.5 fL   Neutro Abs 8,306 (H) 1,500 - 7,800 cells/uL   Absolute Lymphocytes 2,587 850 - 3,900 cells/uL   Absolute Monocytes 673 200 - 950 cells/uL   Eosinophils Absolute 0 (L) 15 - 500 cells/uL   Basophils Absolute 35 0 - 200 cells/uL   Neutrophils Relative % 71.6 %   Total Lymphocyte 22.3 %   Monocytes Relative 5.8 %   Eosinophils Relative 0.0 %   Basophils Relative 0.3 %  Comprehensive metabolic panel with GFR   Collection Time: 05/17/24 11:49 AM  Result Value Ref Range   Glucose, Bld 97 65 - 99 mg/dL   BUN 9 7 - 25 mg/dL   Creat 8.65 (H) 7.84 - 1.05 mg/dL   eGFR 39 (L) > OR = 60 mL/min/1.71m2   BUN/Creatinine Ratio 6 6 - 22 (calc)   Sodium 143 135 - 146 mmol/L   Potassium 3.8 3.5 - 5.3 mmol/L   Chloride 104 98 - 110 mmol/L   CO2 28 20 - 32 mmol/L   Calcium  9.7 8.6 - 10.4 mg/dL   Total Protein 7.2 6.1 - 8.1 g/dL   Albumin 4.2 3.6 - 5.1 g/dL   Globulin 3.0 1.9 - 3.7 g/dL (calc)   AG Ratio 1.4 1.0 - 2.5 (calc)   Total Bilirubin 0.7 0.2 - 1.2 mg/dL   Alkaline phosphatase (APISO) 90 37 - 153 U/L   AST 17 10 - 35 U/L   ALT 10 6 - 29 U/L       Assessment & Plan:     ICD-10-CM   1. Loss of appetite  R63.0 Urine Culture    POCT urinalysis dipstick    Semaglutide ,0.25 or 0.5MG /DOS, (OZEMPIC , 0.25 OR 0.5 MG/DOSE,) 2 MG/3ML SOPN   likely SE due to increase ozempic  dose Dose increased to 1 mg a little over 2 weeks ago, full bloated feeling, gets full early, decreased appetite, things taste different - likely all due to meds No  vomiting Will r/o UTI Lower ozempic  dose Rx sent in She can restart Monday or wait one more week Some weight loss, enouraged her to stay hydrated, eat small portions and push protein in diet     2. Medication side effect  T88.7XXA Semaglutide ,0.25 or 0.5MG /DOS, (OZEMPIC , 0.25 OR 0.5 MG/DOSE,) 2 MG/3ML SOPN   hold ozempic  for 1-2  more week then can try to resume lower dose    3. Acute low back pain, unspecified back pain laterality, unspecified whether sciatica present  M54.50 Urine Culture    POCT urinalysis dipstick    DG Lumbar Spine Complete  Ambulatory referral to Physical Therapy    Baclofen  5 MG TABS   x3 weeks, mild low back ttp, no midline tenderness, good ROM, can do heat tx, try muscle relaxer, encouraged walking, PT referral and xray f/up 1 week    4. Type 2 diabetes mellitus with stage 3 chronic kidney disease and hypertension (HCC)  E11.22 Semaglutide ,0.25 or 0.5MG /DOS, (OZEMPIC , 0.25 OR 0.5 MG/DOSE,) 2 MG/3ML SOPN   I12.9    N18.30   Rx change due to SE    Return for 1 week f/up for back pain, med SE .    Adeline Hone, PA-C 06/09/24 11:44 AM

## 2024-06-09 NOTE — Patient Instructions (Signed)
 I would try to resume ozempic  next week at the lower dose and then follow up with doctor sowles about it if you cannot tolerate the 1 mg dose  Try heat therapy, tylenol , and you should hear from PT to set up an appointment to do an assessment and start treatment for back pain.  We will rule out a UTI and let you know the results.

## 2024-06-09 NOTE — Telephone Encounter (Signed)
 No notes to provide, please follow up.     Copied from CRM [id number]. Topic: Clinical - Lab/Test Results >> Jun 09, 2024  4:30 PM Chrystal Crape R wrote: Pt calling for a clinical  explanation on what her lab results mean.

## 2024-06-10 ENCOUNTER — Observation Stay
Admission: EM | Admit: 2024-06-10 | Discharge: 2024-06-12 | Disposition: A | Discharge status: Home / Self Care | Attending: Internal Medicine | Admitting: Internal Medicine

## 2024-06-10 ENCOUNTER — Encounter: Payer: Self-pay | Admitting: Emergency Medicine

## 2024-06-10 ENCOUNTER — Emergency Department

## 2024-06-10 DIAGNOSIS — J45909 Unspecified asthma, uncomplicated: Secondary | ICD-10-CM | POA: Diagnosis not present

## 2024-06-10 DIAGNOSIS — R9431 Abnormal electrocardiogram [ECG] [EKG]: Secondary | ICD-10-CM

## 2024-06-10 DIAGNOSIS — Z7901 Long term (current) use of anticoagulants: Secondary | ICD-10-CM | POA: Insufficient documentation

## 2024-06-10 DIAGNOSIS — R42 Dizziness and giddiness: Secondary | ICD-10-CM | POA: Diagnosis not present

## 2024-06-10 DIAGNOSIS — R231 Pallor: Secondary | ICD-10-CM | POA: Diagnosis not present

## 2024-06-10 DIAGNOSIS — N1832 Chronic kidney disease, stage 3b: Secondary | ICD-10-CM | POA: Diagnosis present

## 2024-06-10 DIAGNOSIS — J4550 Severe persistent asthma, uncomplicated: Secondary | ICD-10-CM | POA: Diagnosis present

## 2024-06-10 DIAGNOSIS — I129 Hypertensive chronic kidney disease with stage 1 through stage 4 chronic kidney disease, or unspecified chronic kidney disease: Secondary | ICD-10-CM | POA: Diagnosis not present

## 2024-06-10 DIAGNOSIS — I471 Supraventricular tachycardia, unspecified: Secondary | ICD-10-CM | POA: Diagnosis not present

## 2024-06-10 DIAGNOSIS — F1290 Cannabis use, unspecified, uncomplicated: Secondary | ICD-10-CM | POA: Insufficient documentation

## 2024-06-10 DIAGNOSIS — Z79899 Other long term (current) drug therapy: Secondary | ICD-10-CM | POA: Insufficient documentation

## 2024-06-10 DIAGNOSIS — R7989 Other specified abnormal findings of blood chemistry: Secondary | ICD-10-CM

## 2024-06-10 DIAGNOSIS — I421 Obstructive hypertrophic cardiomyopathy: Secondary | ICD-10-CM | POA: Diagnosis not present

## 2024-06-10 DIAGNOSIS — R0602 Shortness of breath: Secondary | ICD-10-CM | POA: Diagnosis not present

## 2024-06-10 DIAGNOSIS — Z880 Allergy status to penicillin: Secondary | ICD-10-CM | POA: Diagnosis not present

## 2024-06-10 DIAGNOSIS — R0789 Other chest pain: Secondary | ICD-10-CM | POA: Diagnosis not present

## 2024-06-10 DIAGNOSIS — E876 Hypokalemia: Secondary | ICD-10-CM | POA: Insufficient documentation

## 2024-06-10 DIAGNOSIS — R000 Tachycardia, unspecified: Secondary | ICD-10-CM | POA: Diagnosis not present

## 2024-06-10 DIAGNOSIS — F1090 Alcohol use, unspecified, uncomplicated: Secondary | ICD-10-CM | POA: Insufficient documentation

## 2024-06-10 DIAGNOSIS — E1122 Type 2 diabetes mellitus with diabetic chronic kidney disease: Secondary | ICD-10-CM | POA: Diagnosis not present

## 2024-06-10 DIAGNOSIS — I4581 Long QT syndrome: Secondary | ICD-10-CM | POA: Insufficient documentation

## 2024-06-10 DIAGNOSIS — E1129 Type 2 diabetes mellitus with other diabetic kidney complication: Secondary | ICD-10-CM | POA: Diagnosis present

## 2024-06-10 DIAGNOSIS — R0689 Other abnormalities of breathing: Secondary | ICD-10-CM | POA: Diagnosis not present

## 2024-06-10 DIAGNOSIS — I422 Other hypertrophic cardiomyopathy: Secondary | ICD-10-CM | POA: Diagnosis present

## 2024-06-10 DIAGNOSIS — Z888 Allergy status to other drugs, medicaments and biological substances status: Secondary | ICD-10-CM

## 2024-06-10 DIAGNOSIS — R079 Chest pain, unspecified: Secondary | ICD-10-CM

## 2024-06-10 LAB — BASIC METABOLIC PANEL WITH GFR
Anion gap: 12 (ref 5–15)
BUN: 9 mg/dL (ref 8–23)
CO2: 21 mmol/L — ABNORMAL LOW (ref 22–32)
Calcium: 9.4 mg/dL (ref 8.9–10.3)
Chloride: 104 mmol/L (ref 98–111)
Creatinine, Ser: 1.74 mg/dL — ABNORMAL HIGH (ref 0.44–1.00)
GFR, Estimated: 32 mL/min — ABNORMAL LOW (ref >60)
Glucose, Bld: 117 mg/dL — ABNORMAL HIGH (ref 70–99)
Potassium: 2.8 mmol/L — ABNORMAL LOW (ref 3.5–5.1)
Sodium: 137 mmol/L (ref 135–145)

## 2024-06-10 LAB — CBC WITH DIFFERENTIAL/PLATELET
Abs Immature Granulocytes: 0.06 10*3/uL (ref 0.00–0.07)
Basophils Absolute: 0 10*3/uL (ref 0.0–0.1)
Basophils Relative: 0 %
Eosinophils Absolute: 0 10*3/uL (ref 0.0–0.5)
Eosinophils Relative: 0 %
HCT: 39.4 % (ref 36.0–46.0)
Hemoglobin: 12.9 g/dL (ref 12.0–15.0)
Immature Granulocytes: 1 %
Lymphocytes Relative: 26 %
Lymphs Abs: 2.8 10*3/uL (ref 0.7–4.0)
MCH: 27.2 pg (ref 26.0–34.0)
MCHC: 32.7 g/dL (ref 30.0–36.0)
MCV: 82.9 fL (ref 80.0–100.0)
Monocytes Absolute: 0.7 10*3/uL (ref 0.1–1.0)
Monocytes Relative: 7 %
Neutro Abs: 7.1 10*3/uL (ref 1.7–7.7)
Neutrophils Relative %: 66 %
Platelets: 271 10*3/uL (ref 150–400)
RBC: 4.75 MIL/uL (ref 3.87–5.11)
RDW: 14.4 % (ref 11.5–15.5)
WBC: 10.6 10*3/uL — ABNORMAL HIGH (ref 4.0–10.5)
nRBC: 0 % (ref 0.0–0.2)

## 2024-06-10 LAB — TSH: TSH: 2.933 u[IU]/mL (ref 0.350–4.500)

## 2024-06-10 LAB — MAGNESIUM: Magnesium: 2.1 mg/dL (ref 1.7–2.4)

## 2024-06-10 LAB — TROPONIN I (HIGH SENSITIVITY)
Troponin I (High Sensitivity): 10 ng/L (ref <18)
Troponin I (High Sensitivity): 21 ng/L — ABNORMAL HIGH (ref <18)

## 2024-06-10 LAB — T4, FREE: Free T4: 0.81 ng/dL (ref 0.61–1.12)

## 2024-06-10 MED ORDER — POTASSIUM CHLORIDE 10 MEQ/100ML IV SOLN
10.0000 meq | Freq: Once | INTRAVENOUS | Status: AC
  Administered 2024-06-10: 10 meq via INTRAVENOUS
  Filled 2024-06-10: qty 100

## 2024-06-10 MED ORDER — POTASSIUM CHLORIDE 20 MEQ PO PACK
40.0000 meq | PACK | Freq: Once | ORAL | Status: AC
  Administered 2024-06-11: 40 meq via ORAL
  Filled 2024-06-10: qty 2

## 2024-06-10 MED ORDER — ASPIRIN 81 MG PO CHEW
324.0000 mg | CHEWABLE_TABLET | Freq: Once | ORAL | Status: AC
  Filled 2024-06-10: qty 4

## 2024-06-10 MED ORDER — ADENOSINE 12 MG/4ML IV SOLN: 12.0000 mg | Freq: Once | INTRAVENOUS | Status: AC

## 2024-06-10 MED ORDER — FENTANYL CITRATE PF 50 MCG/ML IJ SOSY
50.0000 ug | PREFILLED_SYRINGE | Freq: Once | INTRAMUSCULAR | Status: AC
  Filled 2024-06-10: qty 1

## 2024-06-10 MED ORDER — ADENOSINE 12 MG/4ML IV SOLN
INTRAVENOUS | Status: AC
  Filled 2024-06-10: qty 4

## 2024-06-10 NOTE — ED Provider Notes (Signed)
 [facility] Provider Note    Event Date/Time   First MD Initiated Contact with Patient 06/10/24 2050     (approximate)   History   No chief complaint on file.   HPI Brandy Johnston is a 63 y.o. female with history of HTN, hypertrophic cardiomyopathy, CKD stage III, DM2 presenting today for tachycardia.  Patient reports she was feeling well until she was out shopping with family and had onset of lightheadedness with shortness of breath.  EMS was called and found to have a heart rate in the 190s.  She was given adenosine which brought it down to the low 100s.  She noted before symptoms started she started having chest tightness which is continued even after her heart rate is slowed down.  She denies any recent cough, congestion, abdominal pain, vomiting, diarrhea, constipation.  No leg pain or leg swelling.     Physical Exam   Triage Vital Signs: ED Triage Vitals  Encounter Vitals Group     BP      Girls Systolic BP Percentile      Girls Diastolic BP Percentile      Boys Systolic BP Percentile      Boys Diastolic BP Percentile      Pulse      Resp      Temp      Temp src      SpO2      Weight      Height      Head Circumference      Peak Flow      Pain Score      Pain Loc      Pain Education      Exclude from Growth Chart     Most recent vital signs: Vitals:   06/10/24 2112 06/10/24 2130  BP: 119/74 111/71  Pulse: 93 88  Resp: (!) 28 (!) 33  Temp: 98.3 F (36.8 C)   SpO2: 100% 100%    I have reviewed the vital signs. General:  Awake, alert, no acute distress. Head:  Normocephalic, Atraumatic. EENT:  PERRL, EOMI, Oral mucosa pink and moist, Neck is supple. Cardiovascular: Tachycardic rate, 2+ distal pulses. Respiratory:  Normal respiratory effort, symmetrical expansion, no distress.   Extremities:  Moving all four extremities through full ROM without pain.   Neuro:  Alert and oriented.  Interacting appropriately.   Skin:  Warm, dry,  no rash.   Psych: Appropriate affect.    ED Results / Procedures / Treatments   Labs (all labs ordered are listed, but only abnormal results are displayed) Labs Reviewed  CBC WITH DIFFERENTIAL/PLATELET - Abnormal; Notable for the following components:      Result Value   WBC 10.6 (*)    All other components within normal limits  BASIC METABOLIC PANEL WITH GFR - Abnormal; Notable for the following components:   Potassium 2.8 (*)    CO2 21 (*)    Glucose, Bld 117 (*)    Creatinine, Ser 1.74 (*)    GFR, Estimated 32 (*)    All other components within normal limits  MAGNESIUM   TSH  T4, FREE  TROPONIN I (HIGH SENSITIVITY)  TROPONIN I (HIGH SENSITIVITY)     EKG My EKG interpretation: Patient has a rate of 178 which appears consistent with SVT although slightly difficult to discern if there is any evidence of atrial fibrillation.  EKG after adenosine appears to show P waves without signs of A-fib but still accelerated at  114.   RADIOLOGY Independently interpreted chest x-ray with no acute pathology   PROCEDURES:  Critical Care performed: Yes, see critical care procedure note(s)  .Critical Care  Performed by: Kandee Orion, MD Authorized by: Kandee Orion, MD   Critical care provider statement:    Critical care time (minutes):  30   Critical care was necessary to treat or prevent imminent or life-threatening deterioration of the following conditions: SVT.   Critical care was time spent personally by me on the following activities:  Development of treatment plan with patient or surrogate, discussions with consultants, evaluation of patient's response to treatment, examination of patient, ordering and review of laboratory studies, ordering and review of radiographic studies, ordering and performing treatments and interventions, pulse oximetry, re-evaluation of patient's condition and review of old charts    MEDICATIONS ORDERED IN ED: Medications  potassium chloride  10 mEq  in 100 mL IVPB (10 mEq Intravenous New Bag/Given 06/10/24 2250)  adenosine (ADENOCARD) 12 MG/4ML injection 12 mg (12 mg Intravenous Given 06/10/24 2106)  aspirin  chewable tablet 324 mg (324 mg Oral Given 06/10/24 2153)  fentaNYL  (SUBLIMAZE ) injection 50 mcg (50 mcg Intravenous Given 06/10/24 2153)     IMPRESSION / MDM / ASSESSMENT AND PLAN / ED COURSE  I reviewed the triage vital signs and the nursing notes.                              Differential diagnosis includes, but is not limited to, SVT, A-fib with RVR, electrolyte abnormality, hyperthyroidism, dehydration  Patient's presentation is most consistent with acute presentation with potential threat to life or bodily function.  Patient is a 63 year old female presenting today for tachycardia.  With EMS was reportedly in SVT and got 6 mg adenosine.  Heart rate was in the low 100s on arrival but then rapidly increased up into the 180s.  Blood pressure was stable.  Patient was given 12 mg of adenosine which brought her back down to the low 100s again and 1 L fluid was given.  Chest x-ray with no acute pathology.  Patient does have slight hypokalemia and was given IV potassium repletion.  Otherwise initial troponin, magnesium , and T4/TSH all normal.  Still having ongoing chest pain was given aspirin  and fentanyl  with mild improvement.  Patient was signed out pending repeat troponin.  If still having ongoing chest pain given her significant history with hokum and now SVT, may require admission at that point.  The patient is on the cardiac monitor to evaluate for evidence of arrhythmia and/or significant heart rate changes.     FINAL CLINICAL IMPRESSION(S) / ED DIAGNOSES   Final diagnoses:  SVT (supraventricular tachycardia) (HCC)  Chest pain, unspecified type     Rx / DC Orders   ED Discharge Orders     None        Note:  This document was prepared using Dragon voice recognition software and may include unintentional dictation  errors.   Kandee Orion, MD 06/10/24 [phone]

## 2024-06-10 NOTE — ED Notes (Signed)
Pt placed on zoll pads 

## 2024-06-10 NOTE — ED Triage Notes (Signed)
 Pt here from home with c/o  dizziness and not feeling well while shopping  found to be in svt heart rate 190 by ems , 6mg  adenosine  given and rate down to 103 on arrival , pt c/o slight nausea and so,me chest tightness

## 2024-06-11 ENCOUNTER — Other Ambulatory Visit: Payer: Self-pay

## 2024-06-11 DIAGNOSIS — E876 Hypokalemia: Secondary | ICD-10-CM

## 2024-06-11 DIAGNOSIS — Z888 Allergy status to other drugs, medicaments and biological substances status: Secondary | ICD-10-CM

## 2024-06-11 DIAGNOSIS — R7989 Other specified abnormal findings of blood chemistry: Secondary | ICD-10-CM

## 2024-06-11 DIAGNOSIS — I471 Supraventricular tachycardia, unspecified: Principal | ICD-10-CM | POA: Diagnosis present

## 2024-06-11 DIAGNOSIS — R9431 Abnormal electrocardiogram [ECG] [EKG]: Secondary | ICD-10-CM

## 2024-06-11 DIAGNOSIS — R079 Chest pain, unspecified: Secondary | ICD-10-CM

## 2024-06-11 LAB — BASIC METABOLIC PANEL WITH GFR
Anion gap: 4 — ABNORMAL LOW (ref 5–15)
BUN: 8 mg/dL (ref 8–23)
CO2: 26 mmol/L (ref 22–32)
Calcium: 8 mg/dL — ABNORMAL LOW (ref 8.9–10.3)
Chloride: 109 mmol/L (ref 98–111)
Creatinine, Ser: 1.44 mg/dL — ABNORMAL HIGH (ref 0.44–1.00)
GFR, Estimated: 41 mL/min — ABNORMAL LOW (ref >60)
Glucose, Bld: 76 mg/dL (ref 70–99)
Potassium: 3.6 mmol/L (ref 3.5–5.1)
Sodium: 139 mmol/L (ref 135–145)

## 2024-06-11 LAB — CBG MONITORING, ED
Glucose-Capillary: 83 mg/dL (ref 70–99)
Glucose-Capillary: 82 mg/dL (ref 70–99)
Glucose-Capillary: 83 mg/dL (ref 70–99)

## 2024-06-11 LAB — URINE CULTURE
MICRO NUMBER:: 16578470
SPECIMEN QUALITY:: ADEQUATE

## 2024-06-11 LAB — GLUCOSE, CAPILLARY
Glucose-Capillary: 111 mg/dL — ABNORMAL HIGH (ref 70–99)
Glucose-Capillary: 114 mg/dL — ABNORMAL HIGH (ref 70–99)

## 2024-06-11 LAB — HIV ANTIBODY (ROUTINE TESTING W REFLEX): HIV Screen 4th Generation wRfx: NONREACTIVE

## 2024-06-11 MED ORDER — DILTIAZEM HCL ER COATED BEADS 120 MG PO CP24
240.0000 mg | ORAL_CAPSULE | Freq: Every day | ORAL | Status: DC
  Administered 2024-06-11 – 2024-06-12 (×2): 240 mg via ORAL
  Filled 2024-06-11: qty 1
  Filled 2024-06-11: qty 2

## 2024-06-11 MED ORDER — FLUTICASONE FUROATE-VILANTEROL 100-25 MCG/ACT IN AEPB
1.0000 | INHALATION_SPRAY | Freq: Every day | RESPIRATORY_TRACT | Status: DC
  Filled 2024-06-11: qty 28

## 2024-06-11 MED ORDER — MORPHINE SULFATE (PF) 2 MG/ML IV SOLN: 2.0000 mg | INTRAVENOUS | Status: DC | PRN

## 2024-06-11 MED ORDER — ASPIRIN 81 MG PO CHEW
81.0000 mg | CHEWABLE_TABLET | Freq: Every day | ORAL | Status: DC
  Administered 2024-06-11 – 2024-06-12 (×2): 81 mg via ORAL
  Filled 2024-06-11 (×2): qty 1

## 2024-06-11 MED ORDER — ACETAMINOPHEN 650 MG RE SUPP: 650.0000 mg | Freq: Four times a day (QID) | RECTAL | Status: DC | PRN

## 2024-06-11 MED ORDER — EZETIMIBE 10 MG PO TABS
10.0000 mg | ORAL_TABLET | Freq: Every day | ORAL | Status: DC
  Administered 2024-06-11 – 2024-06-12 (×2): 10 mg via ORAL
  Filled 2024-06-11 (×2): qty 1

## 2024-06-11 MED ORDER — POTASSIUM CHLORIDE CRYS ER 20 MEQ PO TBCR
40.0000 meq | EXTENDED_RELEASE_TABLET | Freq: Once | ORAL | Status: AC
  Administered 2024-06-11: 40 meq via ORAL
  Filled 2024-06-11: qty 2

## 2024-06-11 MED ORDER — EMPAGLIFLOZIN 25 MG PO TABS
25.0000 mg | ORAL_TABLET | Freq: Every day | ORAL | Status: DC
  Administered 2024-06-11 – 2024-06-12 (×2): 25 mg via ORAL
  Filled 2024-06-11 (×2): qty 1

## 2024-06-11 MED ORDER — INSULIN ASPART 100 UNIT/ML IJ SOLN: 0.0000 [IU] | Freq: Three times a day (TID) | INTRAMUSCULAR | Status: DC

## 2024-06-11 MED ORDER — PANTOPRAZOLE SODIUM 40 MG PO TBEC
40.0000 mg | DELAYED_RELEASE_TABLET | Freq: Two times a day (BID) | ORAL | Status: DC
  Administered 2024-06-11 – 2024-06-12 (×3): 40 mg via ORAL
  Filled 2024-06-11 (×3): qty 1

## 2024-06-11 MED ORDER — POTASSIUM CHLORIDE CRYS ER 20 MEQ PO TBCR: 20.0000 meq | EXTENDED_RELEASE_TABLET | Freq: Every day | ORAL | Status: DC

## 2024-06-11 MED ORDER — INSULIN NPH (HUMAN) (ISOPHANE) 100 UNIT/ML ~~LOC~~ SUSP
5.0000 [IU] | Freq: Two times a day (BID) | SUBCUTANEOUS | Status: DC
  Administered 2024-06-11: 5 [IU] via SUBCUTANEOUS
  Filled 2024-06-11 (×2): qty 10

## 2024-06-11 MED ORDER — LEVALBUTEROL HCL 0.63 MG/3ML IN NEBU: 0.6300 mg | INHALATION_SOLUTION | Freq: Four times a day (QID) | RESPIRATORY_TRACT | Status: DC | PRN

## 2024-06-11 MED ORDER — BUDESON-GLYCOPYRROL-FORMOTEROL 160-9-4.8 MCG/ACT IN AERO
2.0000 | INHALATION_SPRAY | Freq: Two times a day (BID) | RESPIRATORY_TRACT | Status: DC
  Administered 2024-06-11 – 2024-06-12 (×3): 2 via RESPIRATORY_TRACT
  Filled 2024-06-11: qty 5.9

## 2024-06-11 MED ORDER — MAGNESIUM OXIDE 400 MG PO TABS
400.0000 mg | ORAL_TABLET | Freq: Two times a day (BID) | ORAL | Status: DC
  Administered 2024-06-11 – 2024-06-12 (×3): 400 mg via ORAL
  Filled 2024-06-11 (×6): qty 1

## 2024-06-11 MED ORDER — POTASSIUM CHLORIDE CRYS ER 20 MEQ PO TBCR
20.0000 meq | EXTENDED_RELEASE_TABLET | Freq: Every day | ORAL | Status: DC
  Administered 2024-06-12: 20 meq via ORAL
  Filled 2024-06-11: qty 1

## 2024-06-11 MED ORDER — ENOXAPARIN SODIUM 40 MG/0.4ML IJ SOSY
40.0000 mg | PREFILLED_SYRINGE | INTRAMUSCULAR | Status: DC
  Administered 2024-06-11 – 2024-06-12 (×2): 40 mg via SUBCUTANEOUS
  Filled 2024-06-11 (×2): qty 0.4

## 2024-06-11 MED ORDER — ROSUVASTATIN CALCIUM 10 MG PO TABS
40.0000 mg | ORAL_TABLET | Freq: Every day | ORAL | Status: DC
  Administered 2024-06-11 – 2024-06-12 (×2): 40 mg via ORAL
  Filled 2024-06-11: qty 2
  Filled 2024-06-11: qty 4

## 2024-06-11 MED ORDER — ENSURE PLUS HIGH PROTEIN PO LIQD
237.0000 mL | Freq: Two times a day (BID) | ORAL | Status: DC
  Administered 2024-06-12: 237 mL via ORAL

## 2024-06-11 MED ORDER — BACLOFEN 10 MG PO TABS: 5.0000 mg | ORAL_TABLET | Freq: Three times a day (TID) | ORAL | Status: DC | PRN

## 2024-06-11 MED ORDER — CYCLOBENZAPRINE HCL 10 MG PO TABS
5.0000 mg | ORAL_TABLET | Freq: Every day | ORAL | Status: DC
  Administered 2024-06-11 (×2): 5 mg via ORAL
  Filled 2024-06-11 (×2): qty 1

## 2024-06-11 MED ORDER — MONTELUKAST SODIUM 10 MG PO TABS
10.0000 mg | ORAL_TABLET | Freq: Every day | ORAL | Status: DC
  Administered 2024-06-11 – 2024-06-12 (×2): 10 mg via ORAL
  Filled 2024-06-11 (×2): qty 1

## 2024-06-11 MED ORDER — HYDROCODONE-ACETAMINOPHEN 5-325 MG PO TABS: 1.0000 | ORAL_TABLET | ORAL | Status: DC | PRN

## 2024-06-11 MED ORDER — GABAPENTIN 300 MG PO CAPS
300.0000 mg | ORAL_CAPSULE | Freq: Three times a day (TID) | ORAL | Status: DC
  Administered 2024-06-11 – 2024-06-12 (×4): 300 mg via ORAL
  Filled 2024-06-11 (×4): qty 1

## 2024-06-11 MED ORDER — METOPROLOL SUCCINATE ER 25 MG PO TB24
12.5000 mg | ORAL_TABLET | Freq: Every day | ORAL | Status: DC
  Administered 2024-06-11: 12.5 mg via ORAL
  Filled 2024-06-11 (×2): qty 1

## 2024-06-11 MED ORDER — ACETAMINOPHEN 325 MG PO TABS
650.0000 mg | ORAL_TABLET | Freq: Four times a day (QID) | ORAL | Status: AC | PRN
Start: 2024-06-11 — End: ?

## 2024-06-11 MED ORDER — INSULIN ASPART 100 UNIT/ML IJ SOLN: 0.0000 [IU] | Freq: Every day | INTRAMUSCULAR | Status: DC

## 2024-06-11 MED ORDER — ATOGEPANT 30 MG PO TABS: 1.0000 | ORAL_TABLET | ORAL | Status: DC

## 2024-06-11 MED ORDER — CETIRIZINE HCL 10 MG PO TABS
10.0000 mg | ORAL_TABLET | Freq: Every evening | ORAL | Status: DC
  Administered 2024-06-11: 10 mg via ORAL
  Filled 2024-06-11 (×3): qty 1

## 2024-06-11 NOTE — Assessment & Plan Note (Signed)
 Not acutely exacerbated Continue home inhalers Xopenex as needed

## 2024-06-11 NOTE — H&P (Signed)
 History and Physical    Patient: Brandy Johnston NUU:[id number] DOB: Sep 13, 1961 DOA: 06/10/2024 DOS: the patient was seen and examined on 06/11/2024 PCP: [name], MD  Patient coming from: Home  Chief Complaint: Chest pain and lightheadedness  HPI: ALAZAE Johnston is a 63 y.o. female with medical history significant for Hypertension, HOCM, sickle cell trait, DM, CKD 3, ACE inhibitor related angioedema, being admitted with persistent chest pain s/p 2 episodes of SVT that each converted with adenosine.  She was in her usual state of health and was out shopping and had onset of lightheadedness, shortness of breath and chest tightness.  EMS found her with heart rate in the 190s, converting to sinus tachycardia at 100 with adenosine 6 mg.  Upon her arrival to the ED she was again in SVT at this time converting with adenosine 12 mg.  At the time of admission she remains in sinus rhythm in the 70s to 80s but continues to have chest pressure.  Vitals were otherwise notable for tachypnea to the mid 20s, normal blood pressure and O2 sats 100% on room air.  Troponin 10-21, magnesium  2.1, but low potassium of 2.8.  Creatinine at baseline at 1.74.  WBC 10,000 with otherwise normal CBC.  TSH normal at 2.933.  EKG with prolonged QT at 460.  Chest x-ray nonacute.  Patient was treated with chewable aspirin , given potassium repletion IV and treated with fentanyl  for pain.  Admission requested     Review of Systems: As mentioned in the history of present illness. All other systems reviewed and are negative.  Past Medical History:  Diagnosis Date   Anemia    Chronic renal impairment, stage 3 (moderate) (HCC)    Diabetes mellitus without complication (HCC)    No longer on meds   GERD (gastroesophageal reflux disease)    Hyperlipidemia    Hypertension    Hypokalemia    Insomnia    Low calcium  levels    Osteoarthrosis, hip    right hip   Paresthesia    Sickle cell anemia (HCC)    Sickle-cell trait (HCC)     Tension headache    1x/mo   Wears contact lenses    Past Surgical History:  Procedure Laterality Date   BREAST BIOPSY Right 12/28/2019   stereo [facility] stromal fibrosis   CHOLECYSTECTOMY     COLONOSCOPY     ESOPHAGOGASTRODUODENOSCOPY (EGD) WITH PROPOFOL  N/A 10/05/2016   Procedure: ESOPHAGOGASTRODUODENOSCOPY (EGD) WITH PROPOFOL ;  Surgeon: [name], MD;  Location: [facility];  Service: Endoscopy;  Laterality: N/A;   FRACTURE SURGERY Left    cast and pins    SHOULDER ARTHROSCOPY WITH ROTATOR CUFF REPAIR AND SUBACROMIAL DECOMPRESSION Left 12/07/2018   Procedure: left shoulder manipulation under anesthesia, left shoulder arthroscopic lysis of adhesions;  Surgeon: [name], MD;  Location: [facility];  Service: Orthopedics;  Laterality: Left;   SHOULDER CLOSED REDUCTION Left 12/07/2018   Procedure: CLOSED MANIPULATION SHOULDER;  Surgeon: [name], MD;  Location: [facility];  Service: Orthopedics;  Laterality: Left;   shoulder surgery  Left 06/10/2018   Dr. [name]   TUBAL LIGATION     Social History:  reports that she has never smoked. She has never used smokeless tobacco. She reports current alcohol use. She reports current drug use. Drug: Marijuana.  Allergies  Allergen Reactions   Ace Inhibitors Swelling    Angioedema    Lisinopril  Swelling    Face and neck swelling   Quetiapine  confusion    Family History  Problem Relation Age of Onset   Migraines Mother    Diabetes Mother    Cancer Mother        lung   Arthritis Brother    Breast cancer Maternal Aunt    Cancer Maternal Uncle        Lung and Colon   Cirrhosis Brother    Breast cancer Cousin     Prior to Admission medications   Medication Sig Start Date End Date Taking? Authorizing Provider  albuterol  (VENTOLIN  HFA) 108 (90 Base) MCG/ACT inhaler SMARTSIG:2 Puff(s) By Mouth Every 4-6 Hours PRN 12/02/23   [provider]  aspirin  (ASPIRIN  81) 81 MG chewable tablet Chew 1 tablet (81 mg total)  by mouth daily. 05/03/18   Sowles, Krichna, MD  Atogepant  (QULIPTA ) 30 MG TABS Take 1 tablet (30 mg total) by mouth every morning. 05/17/24   Sowles, Krichna, MD  Baclofen  5 MG TABS Take 1 tablet (5 mg total) by mouth 3 (three) times daily as needed (muscleskeletal pain or spasms). Sedating, do not take with other sedating medications and do not take and drive 1/61/09   Tapia, Leisa, PA-C  benralizumab (FASENRA PEN) 30 MG/ML prefilled autoinjector Inject into the skin. 02/21/24   [provider]  Blood Glucose Monitoring Suppl (CONTOUR NEXT ONE) KIT USE TO TEST BLOOD SUGAR ONCE D 04/21/19   [provider]  Cholecalciferol (VITAMIN D ) 2000 units CAPS Take 1 capsule (2,000 Units total) by mouth daily. 04/20/17   Sowles, Krichna, MD  Continuous Glucose Sensor (FREESTYLE LIBRE 3 SENSOR) MISC 1 Device by Does not apply route every 14 (fourteen) days. 03/22/24   Sowles, Krichna, MD  cyclobenzaprine  (FLEXERIL ) 5 MG tablet Take 1 tablet (5 mg total) by mouth at bedtime. 05/17/24   Sowles, Krichna, MD  diclofenac  Sodium (VOLTAREN ) 1 % GEL Apply 2 g topically 4 (four) times daily. 07/28/23   Sowles, Krichna, MD  diltiazem  (CARDIZEM  CD) 240 MG 24 hr capsule Take 1 capsule by mouth daily. 04/07/24   [provider]  empagliflozin  (JARDIANCE ) 25 MG TABS tablet Take 1 tablet (25 mg total) by mouth daily before breakfast. 05/17/24   Sowles, Krichna, MD  EPINEPHrine  0.3 mg/0.3 mL IJ SOAJ injection Inject into the muscle. 03/20/24   [provider]  ezetimibe  (ZETIA ) 10 MG tablet Take 1 tablet (10 mg total) by mouth daily. 05/17/24   Sowles, Krichna, MD  fluticasone  [facility]) 50 MCG/ACT nasal spray 2 sprays each nostril Daily. DISP#  1 bottles = 1 month supply. 02/08/24   [provider]  fluticasone  furoate-vilanterol (BREO ELLIPTA ) 100-25 MCG/ACT AEPB Inhale 1 puff into the lungs daily.    [provider]  gabapentin  (NEURONTIN ) 300 MG capsule Take 1 capsule (300 mg total) by  mouth 3 (three) times daily. 05/17/24   Sowles, Krichna, MD  glucose blood test strip Use as instructed 04/21/19   Sowles, Krichna, MD  insulin  NPH Human (NOVOLIN N) 100 UNIT/ML injection Inject into the skin. 03/13/24   [provider]  ipratropium-albuterol  (DUONEB) 0.5-2.5 (3) MG/3ML SOLN Take 3 mLs by nebulization every 6 (six) hours as needed. 12/09/23   Quinton Buckler, FNP  levocetirizine (XYZAL ) 5 MG tablet Take 1 tablet (5 mg total) by mouth every evening. 05/29/24   Sowles, Krichna, MD  magnesium  oxide (MAG-OX) 400 MG tablet Take 1 tablet (400 mg total) by mouth 2 (two) times daily. 03/22/24   Sowles, Krichna, MD  metoprolol  succinate (TOPROL -XL) 25 MG  24 hr tablet Take 0.5 tablets (12.5 mg total) by mouth daily. Takes 1/2 tablet 03/22/24   Sowles, Krichna, MD  Microlet Lancets MISC USE TO CHECK BLOOD SUGAR ONCE D 04/21/19   [provider]  montelukast (SINGULAIR) 10 MG tablet Take by mouth. 02/21/24   [provider]  ondansetron  (ZOFRAN -ODT) 4 MG disintegrating tablet Take 1 tablet (4 mg total) by mouth every 8 (eight) hours as needed for nausea. 01/27/24   Pender, Julie F, FNP  pantoprazole  (PROTONIX ) 40 MG tablet Take 1 tablet (40 mg total) by mouth 2 (two) times daily. 03/22/24   Sowles, Krichna, MD  rosuvastatin  (CRESTOR ) 40 MG tablet Take 1 tablet (40 mg total) by mouth daily. 05/17/24   Sowles, Krichna, MD  Semaglutide ,0.25 or 0.5MG /DOS, (OZEMPIC , 0.25 OR 0.5 MG/DOSE,) 2 MG/3ML SOPN Inject 0.5 mg into the skin once a week. 06/09/24   Tapia, Leisa, PA-C  Sharps Container (BD SHARPS COLLECTOR) MISC Use as directed to dispose of Fasenra pen 04/03/24   [provider]  sodium chloride  0.9 % nebulizer solution Inhale into the lungs. 02/21/24   [provider]  Spacer/Aero-Holding [facility] DIAMOND) MISC  02/21/24   [provider]  Ubrogepant  (UBRELVY ) 100 MG TABS Take 1 tablet (100 mg total) by mouth daily as needed. 03/22/24   Sowles,  Krichna, MD  lisinopril  (ZESTRIL ) 10 MG tablet Take 1 tablet (10 mg total) by mouth daily. 08/11/19 01/29/20  [name], MD    Physical Exam: Vitals:   06/10/24 2052 06/10/24 2112 06/10/24 2130 06/10/24 2300  BP:  119/74 111/71 103/68  Pulse:  93 88 72  Resp:  (!) 28 (!) 33 (!) 26  Temp:  98.3 F (36.8 C)    SpO2:  100% 100% 100%  Weight: 68 kg     Height: 5' 4 (1.626 m)      Physical Exam Vitals and nursing note reviewed.  Constitutional:      General: She is not in acute distress.    Interventions: Nasal cannula in place.     Comments: Somewhat ill-appearing.  Conversational dyspnea  HENT:     Head: Normocephalic and atraumatic.   Cardiovascular:     Rate and Rhythm: Normal rate and regular rhythm.     Heart sounds: Normal heart sounds.  Pulmonary:     Effort: Pulmonary effort is normal.     Breath sounds: Normal breath sounds.  Abdominal:     Palpations: Abdomen is soft.     Tenderness: There is no abdominal tenderness.   Neurological:     Mental Status: Mental status is at baseline.     Labs on Admission: I have personally reviewed following labs and imaging studies  CBC: Recent Labs  Lab 06/10/24 2055  WBC 10.6*  NEUTROABS 7.1  HGB 12.9  HCT 39.4  MCV 82.9  PLT 271   Basic Metabolic Panel: Recent Labs  Lab 06/10/24 2055  NA 137  K 2.8*  CL 104  CO2 21*  GLUCOSE 117*  BUN 9  CREATININE 1.74*  CALCIUM  9.4  MG 2.1   GFR: Estimated Creatinine Clearance: 30.9 mL/min (A) (by C-G formula based on SCr of 1.74 mg/dL (H)). Liver Function Tests: No results for input(s): AST, ALT, ALKPHOS, BILITOT, PROT, ALBUMIN in the last 168 hours. No results for input(s): LIPASE, AMYLASE in the last 168 hours. No results for input(s): AMMONIA in the last 168 hours. Coagulation Profile: No results for input(s): INR, PROTIME in the last 168 hours. Cardiac  Enzymes: No results for input(s): CKTOTAL, CKMB, CKMBINDEX, TROPONINI in  the last 168 hours. BNP (last 3 results) No results for input(s): PROBNP in the last 8760 hours. HbA1C: No results for input(s): HGBA1C in the last 72 hours. CBG: No results for input(s): GLUCAP in the last 168 hours. Lipid Profile: No results for input(s): CHOL, HDL, LDLCALC, TRIG, CHOLHDL, LDLDIRECT in the last 72 hours. Thyroid  Function Tests: Recent Labs    06/10/24 2120  TSH 2.933  FREET4 0.81   Anemia Panel: No results for input(s): VITAMINB12, FOLATE, FERRITIN, TIBC, IRON, RETICCTPCT in the last 72 hours. Urine analysis:    Component Value Date/Time   COLORURINE YELLOW (A) 10/15/2023 2032   APPEARANCEUR CLOUDY (A) 10/15/2023 2032   APPEARANCEUR Clear 07/08/2012 0043   LABSPEC 1.013 10/15/2023 2032   LABSPEC 1.020 07/08/2012 0043   PHURINE 7.0 10/15/2023 2032   GLUCOSEU NEGATIVE 10/15/2023 2032   GLUCOSEU Negative 07/08/2012 0043   HGBUR NEGATIVE 10/15/2023 2032   BILIRUBINUR negative 06/09/2024 1219   BILIRUBINUR Negative 07/08/2012 0043   KETONESUR NEGATIVE 10/15/2023 2032   PROTEINUR Positive (A) 06/09/2024 1219   PROTEINUR 30 (A) 10/15/2023 2032   UROBILINOGEN 1.0 06/09/2024 1219   NITRITE negative 06/09/2024 1219   NITRITE NEGATIVE 10/15/2023 2032   LEUKOCYTESUR Negative 06/09/2024 1219   LEUKOCYTESUR TRACE (A) 10/15/2023 2032   LEUKOCYTESUR Negative 07/08/2012 0043    Radiological Exams on Admission: DG Chest Portable 1 View Result Date: 06/10/2024 CLINICAL DATA:  Short of breath, tachycardia EXAM: PORTABLE CHEST 1 VIEW COMPARISON:  10/15/2023 FINDINGS: The heart size and mediastinal contours are within normal limits. Both lungs are clear. The visualized skeletal structures are unremarkable. IMPRESSION: No active disease. Electronically Signed   By: Bobbye Burrow M.D.   On: 06/10/2024 21:52   Data Reviewed for HPI: Relevant notes from primary care and specialist visits, past discharge summaries as available in EHR, including  Care Everywhere. Prior diagnostic testing as pertinent to current admission diagnoses Updated medications and problem lists for reconciliation ED course, including vitals, labs, imaging, treatment and response to treatment Triage notes, nursing and pharmacy notes and ED provider's notes Notable results as noted above in HPI      Assessment and Plan: * Paroxysmal SVT (supraventricular tachycardia) (HCC) Prolonged QT interval Aborted with adenosine 6, recurred and then again aborted with adenosine 12 Continue home diltiazem  and metoprolol  Continuous cardiac monitoring Keep potassium over 4 and magnesium  over 2 Avoid QT prolonging medication  Hypokalemia Received IV repletion in the ED Monitor and replete as needed  Elevated troponin Chest pain Suspect supply/demand mismatch from tachyarrhythmia: Mild troponin increased from 10-21, EKG without ischemic changes Pain control Continue to monitor for worsening  HOCM (hypertrophic obstructive cardiomyopathy) (HCC) No acute issues suspected at this time  History of angiotensin converting enzyme inhibitor (ACE-I) allergy History of angioedema-avoid ACE inhibitors  Asthma Not acutely exacerbated Continue home inhalers Xopenex as needed  Diabetes mellitus with renal manifestation (HCC) Sliding scale insulin  coverage  Stage 3b chronic kidney disease (HCC) Renal function at baseline     DVT prophylaxis: Lovenox   Consults: none  Advance Care Planning:   Code Status: Prior   Family Communication: Significant other at bedside.  Explained working diagnosis, treatment plan and they voiced understanding and agreement with plan  Disposition Plan: Back to previous home environment  Severity of Illness: The appropriate patient status for this patient is OBSERVATION. Observation status is judged to be reasonable and necessary in order to provide the required intensity  of service to ensure the patient's safety. The patient's  presenting symptoms, physical exam findings, and initial radiographic and laboratory data in the context of their medical condition is felt to place them at decreased risk for further clinical deterioration. Furthermore, it is anticipated that the patient will be medically stable for discharge from the hospital within 2 midnights of admission.   Author: Lanetta Pion, MD 06/11/2024 12:56 AM  For on call review [email or web address].

## 2024-06-11 NOTE — Assessment & Plan Note (Signed)
 Prolonged QT interval Aborted with adenosine 6, recurred and then again aborted with adenosine 12 Continue home diltiazem  and metoprolol  Continuous cardiac monitoring Keep potassium over 4 and magnesium  over 2 Avoid QT prolonging medication

## 2024-06-11 NOTE — Assessment & Plan Note (Signed)
 History of angioedema-avoid ACE inhibitors

## 2024-06-11 NOTE — Assessment & Plan Note (Signed)
 Received IV repletion in the ED Monitor and replete as needed

## 2024-06-11 NOTE — Progress Notes (Signed)
 Patient seen in the ER. No family at bedside. Briefly spoke with patient's son on the phone. Apparently came in after she started feeling dizzy lightheaded while she had gone grocery shopping. Came to the emergency room was found to be in SVT. Received two doses of adenosine 6 mg followed by 12 mg. Patient since that has been in sinus rhythm maintaining heart rate in the 70s with stable blood pressure. She is not having dizziness. Will Roy wean to room air.  Was found to have potassium of 2.8. Replete did with IV and oral doses of KCl. Magnesium  is stable. Patient follows with [facility] cardiology for history of hypertrophic cardiomyopathy. Will continue to monitor consider cardiology consultation if needed. If remains stable discharge to home tomorrow and outpatient close follow-up with [facility] cardiology. Patient voice understanding and is agreeable with plan.

## 2024-06-11 NOTE — Plan of Care (Signed)

## 2024-06-11 NOTE — Assessment & Plan Note (Signed)
 Chest pain Suspect supply/demand mismatch from tachyarrhythmia: Mild troponin increased from 10-21, EKG without ischemic changes Pain control Continue to monitor for worsening

## 2024-06-11 NOTE — Assessment & Plan Note (Signed)
 Sliding scale insulin coverage

## 2024-06-11 NOTE — ED Notes (Signed)
 Hospitalist at bedside

## 2024-06-11 NOTE — Assessment & Plan Note (Signed)
No acute issues suspected at this time

## 2024-06-11 NOTE — ED Notes (Signed)
Pt ambulatory to bathroom independently with steady gait.

## 2024-06-11 NOTE — Assessment & Plan Note (Signed)
 -  Renal function  at baseline

## 2024-06-12 ENCOUNTER — Ambulatory Visit: Payer: Self-pay | Admitting: Family Medicine

## 2024-06-12 ENCOUNTER — Other Ambulatory Visit: Payer: Self-pay | Admitting: Family Medicine

## 2024-06-12 ENCOUNTER — Other Ambulatory Visit: Payer: Self-pay

## 2024-06-12 DIAGNOSIS — E1122 Type 2 diabetes mellitus with diabetic chronic kidney disease: Secondary | ICD-10-CM | POA: Diagnosis not present

## 2024-06-12 DIAGNOSIS — N183 Chronic kidney disease, stage 3 unspecified: Secondary | ICD-10-CM | POA: Diagnosis not present

## 2024-06-12 DIAGNOSIS — J4550 Severe persistent asthma, uncomplicated: Secondary | ICD-10-CM | POA: Diagnosis not present

## 2024-06-12 DIAGNOSIS — T887XXA Unspecified adverse effect of drug or medicament, initial encounter: Secondary | ICD-10-CM

## 2024-06-12 DIAGNOSIS — I471 Supraventricular tachycardia, unspecified: Secondary | ICD-10-CM | POA: Diagnosis not present

## 2024-06-12 DIAGNOSIS — R630 Anorexia: Secondary | ICD-10-CM

## 2024-06-12 DIAGNOSIS — I422 Other hypertrophic cardiomyopathy: Secondary | ICD-10-CM | POA: Diagnosis not present

## 2024-06-12 LAB — GLUCOSE, CAPILLARY: Glucose-Capillary: 76 mg/dL (ref 70–99)

## 2024-06-12 MED ORDER — ATOGEPANT 30 MG PO TABS: 1.0000 | ORAL_TABLET | ORAL | Status: DC

## 2024-06-12 MED ORDER — CEPHALEXIN 500 MG PO CAPS: 500.0000 mg | ORAL_CAPSULE | Freq: Two times a day (BID) | ORAL | 0 refills | Status: DC

## 2024-06-12 MED ORDER — POTASSIUM CHLORIDE CRYS ER 20 MEQ PO TBCR: 20.0000 meq | EXTENDED_RELEASE_TABLET | Freq: Every day | ORAL | 1 refills | Status: DC

## 2024-06-12 NOTE — Care Management Obs Status (Signed)
 MEDICARE OBSERVATION STATUS NOTIFICATION   Patient Details  Name: Brandy Johnston MRN: [id number] Date of Birth: [birth year]   Medicare Observation Status Notification Given:  No (patient did not want a copy)    Anise Kerns 06/12/2024, 10:41 AM

## 2024-06-12 NOTE — Discharge Summary (Signed)
 Physician Discharge Summary   Patient: Brandy Johnston MRN: [id number] DOB: 1961/12/21  Admit date:     06/10/2024  Discharge date: 06/12/2024  Discharge Physician: Leita Blanch   PCP: Glenard Mire, MD   Recommendations at discharge:   follow-up [facility] cardiology in 1 to 2 week  Discharge Diagnoses: Principal Problem:   Paroxysmal SVT (supraventricular tachycardia) (HCC) Active Problems:   Prolonged QT interval   Hypokalemia   HOCM (hypertrophic obstructive cardiomyopathy) (HCC)   Elevated troponin   Chest pain   Stage 3b chronic kidney disease (HCC)   Diabetes mellitus with renal manifestation (HCC)   Asthma   History of angiotensin converting enzyme inhibitor (ACE-I) allergy   SVT (supraventricular tachycardia) (HCC)   Brandy Johnston is a 63 y.o. female with medical history significant for Hypertension, HOCM, sickle cell trait, DM, CKD 3, ACE inhibitor related angioedema, being admitted with persistent chest pain s/p 2 episodes of SVT that each converted with adenosine .  She was in her usual state of health and was out shopping and had onset of lightheadedness, shortness of breath and chest tightness.  EMS found her with heart rate in the 190s, converting to sinus tachycardia at 100 with adenosine  6 mg.  Upon her arrival to the ED she was again in SVT at this time converting with adenosine  12 mg.     Paroxysmal SVT (supraventricular tachycardia) (HCC) --Aborted with adenosine  6, recurred and then again aborted with adenosine  12 --Continue home diltiazem  and metoprolol  -- remained in regular rhythm --Keep potassium over 4 and magnesium  over 2 --Avoid QT prolonging medication   Hypokalemia Received IV repletion in the ED -- came in with potassium of 2.8. Replete potassium was 4.6   Elevated troponin Chest pain --Suspect supply/demand mismatch from tachyarrhythmia: Mild troponin increased from 10-21, EKG without ischemic changes -- resolved   HOCM (hypertrophic obstructive  cardiomyopathy) (HCC) No acute issues suspected at this time -- follows at [facility] cardiology   History of angiotensin converting enzyme inhibitor (ACE-I) allergy History of angioedema-avoid ACE inhibitors   Asthma Not acutely exacerbated Continue home inhalers Xopenex  as needed   Diabetes mellitus with renal manifestation (HCC) Sliding scale insulin  coverage   Stage 3b chronic kidney disease (HCC) Renal function at baseline  Patient overall remains stable cardiac wise. She will discharge to home with outpatient follow-up [facility] cardiology she was advised to return back to the ER if send symptoms return      Consultants: none Procedures performed: none Disposition: Home Diet recommendation:  Discharge Diet Orders (From admission, onward)     Start     Ordered   06/12/24 0000  Diet - low sodium heart healthy        06/12/24 0959   06/12/24 0000  Diet Carb Modified        06/12/24 0959           Cardiac diet DISCHARGE MEDICATION: Allergies as of 06/12/2024       Reactions   Ace Inhibitors Swelling   Angioedema    Lisinopril  Swelling   Face and neck swelling   Quetiapine    confusion        Medication List     STOP taking these medications    fluticasone  furoate-vilanterol 100-25 MCG/ACT Aepb Commonly known as: BREO ELLIPTA        TAKE these medications    albuterol  108 (90 Base) MCG/ACT inhaler Commonly known as: VENTOLIN  HFA SMARTSIG:2 Puff(s) By Mouth Every 4-6 Hours PRN   aspirin  81  MG chewable tablet Commonly known as: Aspirin  81 Chew 1 tablet (81 mg total) by mouth daily.   Baclofen  5 MG Tabs Take 1 tablet (5 mg total) by mouth 3 (three) times daily as needed (muscleskeletal pain or spasms). Sedating, do not take with other sedating medications and do not take and drive   BD Buyer, retail Use as directed to dispose of Fasenra pen   benralizumab 30 MG/ML prefilled autoinjector Commonly known as: FASENRA PEN Inject into the skin  every 30 (thirty) days.   Breztri  Aerosphere 160-9-4.8 MCG/ACT Aero inhaler Generic drug: budesonide -glycopyrrolate -formoterol  Inhale 2 puffs into the lungs 2 (two) times daily.   Contour Next One Kit USE TO TEST BLOOD SUGAR ONCE D   cyclobenzaprine  5 MG tablet Commonly known as: FLEXERIL  Take 1 tablet (5 mg total) by mouth at bedtime.   diclofenac  Sodium 1 % Gel Commonly known as: Voltaren  Apply 2 g topically 4 (four) times daily. What changed:  when to take this reasons to take this   diltiazem  240 MG 24 hr capsule Commonly known as: CARDIZEM  CD Take 1 capsule by mouth daily.   empagliflozin  25 MG Tabs tablet Commonly known as: Jardiance  Take 1 tablet (25 mg total) by mouth daily before breakfast.   EPINEPHrine  0.3 mg/0.3 mL Soaj injection Commonly known as: EPI-PEN Inject into the muscle.   ezetimibe  10 MG tablet Commonly known as: Zetia  Take 1 tablet (10 mg total) by mouth daily.   fluticasone  50 MCG/ACT nasal spray Commonly known as: FLONASE 2 sprays each nostril Daily. DISP#  1 bottles = 1 month supply.   FreeStyle Libre 3 Sensor Misc 1 Device by Does not apply route every 14 (fourteen) days.   gabapentin  300 MG capsule Commonly known as: NEURONTIN  Take 1 capsule (300 mg total) by mouth 3 (three) times daily.   glucose blood test strip Use as instructed   insulin  NPH Human 100 UNIT/ML injection Commonly known as: NOVOLIN N Inject into the skin as needed.   ipratropium-albuterol  0.5-2.5 (3) MG/3ML Soln Commonly known as: DUONEB Take 3 mLs by nebulization every 6 (six) hours as needed.   levocetirizine 5 MG tablet Commonly known as: XYZAL  Take 1 tablet (5 mg total) by mouth every evening.   magnesium  oxide 400 MG tablet Commonly known as: MAG-OX Take 1 tablet (400 mg total) by mouth 2 (two) times daily.   metoprolol  succinate 25 MG 24 hr tablet Commonly known as: TOPROL -XL Take 0.5 tablets (12.5 mg total) by mouth daily. Takes 1/2 tablet    Microlet Lancets Misc USE TO CHECK BLOOD SUGAR ONCE D   montelukast  10 MG tablet Commonly known as: SINGULAIR  Take 10 mg by mouth daily.   ondansetron  4 MG disintegrating tablet Commonly known as: ZOFRAN -ODT Take 1 tablet (4 mg total) by mouth every 8 (eight) hours as needed for nausea.   optichamber diamond Misc   Ozempic  (0.25 or 0.5 MG/DOSE) 2 MG/3ML Sopn Generic drug: Semaglutide (0.25 or 0.5MG /DOS) Inject 0.5 mg into the skin once a week.   pantoprazole  40 MG tablet Commonly known as: PROTONIX  Take 1 tablet (40 mg total) by mouth 2 (two) times daily.   potassium chloride  SA 20 MEQ tablet Commonly known as: KLOR-CON  M Take 1 tablet (20 mEq total) by mouth daily. Start taking on: June 13, 2024   Qulipta  30 MG Tabs Generic drug: Atogepant  Take 1 tablet (30 mg total) by mouth every morning.   rosuvastatin  40 MG tablet Commonly known as: CRESTOR  Take 1 tablet (40  mg total) by mouth daily.   sodium chloride  0.9 % nebulizer solution Inhale 3 mLs into the lungs 2 (two) times daily.   Ubrelvy  100 MG Tabs Generic drug: Ubrogepant  Take 1 tablet (100 mg total) by mouth daily as needed.   Vitamin D  50 MCG (2000 UT) Caps Take 1 capsule (2,000 Units total) by mouth daily.        Follow-up Information     Sowles, Krichna, MD. Schedule an appointment as soon as possible for a visit in 1 week(s).   Specialty: Family Medicine Contact information: [address] [place] [address] [phone]         Pia Sharper, MD. Go to.   Specialty: Internal Medicine Why: upcoming appt on Friday Contact information: [id number] [place] [address] [phone]                Discharge Exam: Fredricka Weights   06/10/24 2052  Weight: 68 kg   Cardiovascular both heart sounds normal. No murmur. Respiratory clear to auscultation patient alert and oriented  Condition at discharge: fair  The results of significant diagnostics from  this hospitalization (including imaging, microbiology, ancillary and laboratory) are listed below for reference.   Imaging Studies: DG Chest Portable 1 View Result Date: 06/10/2024 CLINICAL DATA:  Short of breath, tachycardia EXAM: PORTABLE CHEST 1 VIEW COMPARISON:  10/15/2023 FINDINGS: The heart size and mediastinal contours are within normal limits. Both lungs are clear. The visualized skeletal structures are unremarkable. IMPRESSION: No active disease. Electronically Signed   By: Sharper Daring M.D.   On: 06/10/2024 21:52    Microbiology: Results for orders placed or performed in visit on 06/09/24  Urine Culture     Status: Abnormal   Collection Time: 06/09/24 12:16 PM   Specimen: Urine  Result Value Ref Range Status   MICRO NUMBER: [id number]  Final   SPECIMEN QUALITY: Adequate  Final   Sample Source URINE  Final   STATUS: FINAL  Final   ISOLATE 1: Staphylococcus epidermidis (A)  Final    Comment: 50,000-100,000 CFU/mL of Staphylococcus epidermidis May represent colonizers from external and internal genitalia. No further testing (including susceptibility) will be performed.    Labs: CBC: Recent Labs  Lab 06/10/24 2055  WBC 10.6*  NEUTROABS 7.1  HGB 12.9  HCT 39.4  MCV 82.9  PLT 271   Basic Metabolic Panel: Recent Labs  Lab 06/10/24 2055 06/11/24 0708  NA 137 139  K 2.8* 3.6  CL 104 109  CO2 21* 26  GLUCOSE 117* 76  BUN 9 8  CREATININE 1.74* 1.44*  CALCIUM  9.4 8.0*  MG 2.1  --    CBG: Recent Labs  Lab 06/11/24 0758 06/11/24 1202 06/11/24 1621 06/11/24 2048 06/12/24 0809  GLUCAP 82 83 111* 114* 76    Discharge time spent: greater than 30 minutes.  Signed: Leita Blanch, MD [facility] 06/12/2024

## 2024-06-12 NOTE — Telephone Encounter (Signed)
 Spoke with pt and per Dr Ava Lei pt would need to keep her regular appointment separated from her hospital followup appointments. Both appointments have been scheduled.

## 2024-06-12 NOTE — Telephone Encounter (Signed)
 Pt needs to do hospital f/u to go over labs

## 2024-06-12 NOTE — Discharge Instructions (Signed)
 Keep log of your BP in home Return to ER if your symptoms recurr

## 2024-06-14 ENCOUNTER — Ambulatory Visit (INDEPENDENT_AMBULATORY_CARE_PROVIDER_SITE_OTHER): Admitting: Family Medicine

## 2024-06-14 ENCOUNTER — Encounter: Payer: Self-pay | Admitting: Family Medicine

## 2024-06-14 VITALS — BP 114/72 | HR 91 | Resp 16 | Ht 64.0 in | Wt 154.9 lb

## 2024-06-14 DIAGNOSIS — I471 Supraventricular tachycardia, unspecified: Secondary | ICD-10-CM

## 2024-06-14 DIAGNOSIS — I422 Other hypertrophic cardiomyopathy: Secondary | ICD-10-CM | POA: Diagnosis not present

## 2024-06-14 DIAGNOSIS — N1831 Chronic kidney disease, stage 3a: Secondary | ICD-10-CM | POA: Diagnosis not present

## 2024-06-14 DIAGNOSIS — E876 Hypokalemia: Secondary | ICD-10-CM

## 2024-06-14 DIAGNOSIS — E119 Type 2 diabetes mellitus without complications: Secondary | ICD-10-CM | POA: Diagnosis not present

## 2024-06-14 NOTE — Progress Notes (Signed)
 Name: Brandy Johnston   MRN: [id number]    DOB: June 28, 1961   Date:06/14/2024       Progress Note  Subjective  Chief Complaint  Chief Complaint  Patient presents with   Hospitalization Follow-up    Pt feeling better   Discussed the use of AI scribe software for clinical note transcription with the patient, who gave verbal consent to proceed.  History of Present Illness Brandy Johnston is a 63 year old female that is here today for hospital discharge follow up  Admission date 06/11 and discharge date 06/14    She experienced an episode of lightheadedness and shortness of breath on Saturday after attending a gathering with her sister. While riding a motorized cart in a grocery store, she felt lightheaded, and her symptoms worsened upon standing. She was unable to assist her sister with a wheelchair due to intense shortness of breath and had to sit down immediately upon entering her sister's house.  Upon returning home, she attempted to unload groceries but was unable to do so due to severe shortness of breath. She used a pulse oximeter and noted her heart rate was 185 bpm. Despite lying down, her heart rate fluctuated between 170 and 190 bpm, prompting her friend to call an ambulance. In the ambulance, she received 6 mg of adenosine, which reduced her heart rate to 107 bpm. However, her heart rate increased again upon arrival at the hospital, necessitating a second dose of adenosine. She was monitored overnight, and her potassium was found to be low.  She realized that her blood pressure medication, diltiazem , was missing from her medication package, which she had not taken since the fourth of the month. She continued taking metoprolol  but not diltiazem . She has since resumed diltiazem  and was discharged with potassium supplements, which she takes once daily.  Her potassium levels were normalized by June 11, 2024, and her kidney function improved from low to 41 before discharge. She was also treated  with potassium supplements and fluids in the hospital.   Medication reconciliation was done but phone call post discharge was not completed   Patient Active Problem List   Diagnosis Date Noted   History of angiotensin converting enzyme inhibitor (ACE-I) allergy 06/11/2024   Hypokalemia 06/11/2024   Prolonged QT interval 06/11/2024   Elevated troponin 06/11/2024   Chest pain 06/11/2024   Paroxysmal SVT (supraventricular tachycardia) (HCC) 06/11/2024   SVT (supraventricular tachycardia) (HCC) 06/11/2024   Esophageal dysphagia 05/17/2024   Migraine without aura, intractable, with status migrainosus 05/17/2024   HOCM (hypertrophic obstructive cardiomyopathy) (HCC) 05/17/2024   Dyslipidemia associated with type 2 diabetes mellitus (HCC) 05/17/2024   Hyperglycemia 03/11/2024   Small vessel disease (HCC) 01/26/2024   Asthma 01/18/2024   Muscle spasms of both lower extremities 01/18/2024   Chronic left shoulder pain 11/09/2022   Hyperlipidemia 01/14/2021   Incomplete tear of left rotator cuff 07/03/2020   Angioedema due to angiotensin converting enzyme inhibitor (ACE-I) 01/29/2020   Chronic bilateral back pain 07/26/2017   Coronary artery calcification 05/17/2017   Heart palpitations 05/15/2017   History of acute gastritis    Leukocytosis 09/30/2016   Atherosclerosis of abdominal aorta (HCC) 08/18/2016   Type 2 diabetes mellitus with stage 3 chronic kidney disease and hypertension (HCC) 12/16/2015   Diabetic neuropathy associated with type 2 diabetes mellitus (HCC) 09/18/2015   Insomnia 09/18/2015   Benign essential HTN 06/18/2015   Stage 3b chronic kidney disease (HCC) 06/18/2015   Diabetes mellitus with renal manifestation (HCC)  06/18/2015   Elevated CK 06/18/2015   Obesity (BMI 30.0-34.9) 06/18/2015   Degenerative arthritis of hip 06/18/2015   Sickle cell trait (HCC) 06/18/2015   Dyslipidemia 05/27/2010   Gastroesophageal reflux disease 05/25/2008    Past Surgical History:   Procedure Laterality Date   BREAST BIOPSY Right 12/28/2019   stereo [facility] stromal fibrosis   CHOLECYSTECTOMY     COLONOSCOPY     ESOPHAGOGASTRODUODENOSCOPY (EGD) WITH PROPOFOL  N/A 10/05/2016   Procedure: ESOPHAGOGASTRODUODENOSCOPY (EGD) WITH PROPOFOL ;  Surgeon: [name], MD;  Location: [facility];  Service: Endoscopy;  Laterality: N/A;   FRACTURE SURGERY Left    cast and pins    SHOULDER ARTHROSCOPY WITH ROTATOR CUFF REPAIR AND SUBACROMIAL DECOMPRESSION Left 12/07/2018   Procedure: left shoulder manipulation under anesthesia, left shoulder arthroscopic lysis of adhesions;  Surgeon: [name], MD;  Location: [facility];  Service: Orthopedics;  Laterality: Left;   SHOULDER CLOSED REDUCTION Left 12/07/2018   Procedure: CLOSED MANIPULATION SHOULDER;  Surgeon: [name], MD;  Location: [facility];  Service: Orthopedics;  Laterality: Left;   shoulder surgery  Left 06/10/2018   Dr. [name]   TUBAL LIGATION      Family History  Problem Relation Age of Onset   Migraines Mother    Diabetes Mother    Cancer Mother        lung   Arthritis Brother    Breast cancer Maternal Aunt    Cancer Maternal Uncle        Lung and Colon   Cirrhosis Brother    Breast cancer Cousin     Social History   Tobacco Use   Smoking status: Never   Smokeless tobacco: Never  Substance Use Topics   Alcohol use: Yes    Alcohol/week: 0.0 standard drinks of alcohol    Comment: rare     Current Outpatient Medications:    albuterol  (VENTOLIN  HFA) 108 (90 Base) MCG/ACT inhaler, SMARTSIG:2 Puff(s) By Mouth Every 4-6 Hours PRN, Disp: , Rfl:    aspirin  (ASPIRIN  81) 81 MG chewable tablet, Chew 1 tablet (81 mg total) by mouth daily., Disp: 30 tablet, Rfl: 0   Atogepant  (QULIPTA ) 30 MG TABS, Take 1 tablet (30 mg total) by mouth every morning., Disp: 90 tablet, Rfl: 1   Baclofen  5 MG TABS, Take 1 tablet (5 mg total) by mouth 3 (three) times daily as needed (muscleskeletal pain or spasms). Sedating, do  not take with other sedating medications and do not take and drive, Disp: 60 tablet, Rfl: 0   benralizumab (FASENRA PEN) 30 MG/ML prefilled autoinjector, Inject into the skin every 30 (thirty) days., Disp: , Rfl:    Blood Glucose Monitoring Suppl (CONTOUR NEXT ONE) KIT, USE TO TEST BLOOD SUGAR ONCE D, Disp: , Rfl:    budesonide-glycopyrrolate -formoterol (BREZTRI  AEROSPHERE) 160-9-4.8 MCG/ACT AERO inhaler, Inhale 2 puffs into the lungs 2 (two) times daily., Disp: , Rfl:    Cholecalciferol (VITAMIN D ) 2000 units CAPS, Take 1 capsule (2,000 Units total) by mouth daily., Disp: 30 capsule, Rfl: 0   Continuous Glucose Receiver (FREESTYLE LIBRE 3 READER) DEVI, as directed., Disp: , Rfl:    Continuous Glucose Sensor (FREESTYLE LIBRE 3 SENSOR) MISC, 1 Device by Does not apply route every 14 (fourteen) days., Disp: 6 each, Rfl: 1   cyclobenzaprine  (FLEXERIL ) 5 MG tablet, Take 1 tablet (5 mg total) by mouth at bedtime., Disp: 90 tablet, Rfl: 1   diclofenac  Sodium (VOLTAREN ) 1 % GEL, Apply 2 g topically 4 (four) times daily. (  Patient taking differently: Apply 2 g topically 4 (four) times daily as needed.), Disp: 100 g, Rfl: 2   diltiazem  (CARDIZEM  CD) 240 MG 24 hr capsule, Take 1 capsule by mouth daily., Disp: , Rfl:    empagliflozin  (JARDIANCE ) 25 MG TABS tablet, Take 1 tablet (25 mg total) by mouth daily before breakfast., Disp: 90 tablet, Rfl: 1   EPINEPHrine  0.3 mg/0.3 mL IJ SOAJ injection, Inject into the muscle., Disp: , Rfl:    ezetimibe  (ZETIA ) 10 MG tablet, Take 1 tablet (10 mg total) by mouth daily., Disp: 90 tablet, Rfl: 1   fluticasone  (FLONASE) 50 MCG/ACT nasal spray, 2 sprays each nostril Daily. DISP#  1 bottles = 1 month supply., Disp: , Rfl:    gabapentin  (NEURONTIN ) 300 MG capsule, Take 1 capsule (300 mg total) by mouth 3 (three) times daily., Disp: 270 capsule, Rfl: 1   glucose blood test strip, Use as instructed, Disp: 100 each, Rfl: 12   insulin  NPH Human (NOVOLIN N) 100 UNIT/ML injection,  Inject into the skin as needed., Disp: , Rfl:    ipratropium-albuterol  (DUONEB) 0.5-2.5 (3) MG/3ML SOLN, Take 3 mLs by nebulization every 6 (six) hours as needed., Disp: 360 mL, Rfl: 1   levocetirizine (XYZAL ) 5 MG tablet, Take 1 tablet (5 mg total) by mouth every evening., Disp: 90 tablet, Rfl: 1   magnesium  oxide (MAG-OX) 400 MG tablet, Take 1 tablet (400 mg total) by mouth 2 (two) times daily., Disp: 180 tablet, Rfl: 0   metoprolol  succinate (TOPROL -XL) 25 MG 24 hr tablet, Take 0.5 tablets (12.5 mg total) by mouth daily. Takes 1/2 tablet, Disp: 45 tablet, Rfl: 0   Microlet Lancets MISC, USE TO CHECK BLOOD SUGAR ONCE D, Disp: , Rfl:    montelukast (SINGULAIR) 10 MG tablet, Take 10 mg by mouth daily., Disp: , Rfl:    ondansetron  (ZOFRAN -ODT) 4 MG disintegrating tablet, Take 1 tablet (4 mg total) by mouth every 8 (eight) hours as needed for nausea., Disp: 30 tablet, Rfl: 0   pantoprazole  (PROTONIX ) 40 MG tablet, Take 1 tablet (40 mg total) by mouth 2 (two) times daily., Disp: 180 tablet, Rfl: 0   potassium chloride  SA (KLOR-CON  M) 20 MEQ tablet, Take 1 tablet (20 mEq total) by mouth daily., Disp: 30 tablet, Rfl: 1   rosuvastatin  (CRESTOR ) 40 MG tablet, Take 1 tablet (40 mg total) by mouth daily., Disp: 90 tablet, Rfl: 1   Semaglutide ,0.25 or 0.5MG /DOS, (OZEMPIC , 0.25 OR 0.5 MG/DOSE,) 2 MG/3ML SOPN, Inject 0.5 mg into the skin once a week., Disp: 3 mL, Rfl: 0   Sharps Container (BD SHARPS COLLECTOR) MISC, Use as directed to dispose of Fasenra pen, Disp: , Rfl:    sodium chloride  0.9 % nebulizer solution, Inhale 3 mLs into the lungs 2 (two) times daily., Disp: , Rfl:    Spacer/Aero-Holding Chambers (OPTICHAMBER DIAMOND) MISC, , Disp: , Rfl:    Ubrogepant  (UBRELVY ) 100 MG TABS, Take 1 tablet (100 mg total) by mouth daily as needed., Disp: 48 tablet, Rfl: 0   cephALEXin  (KEFLEX ) 500 MG capsule, Take 1 capsule (500 mg total) by mouth 2 (two) times daily. (Patient not taking: Reported on 06/14/2024), Disp:  6 capsule, Rfl: 0   triamcinolone  cream (KENALOG ) 0.1 %, Apply 1 Application topically as needed., Disp: , Rfl:   Allergies  Allergen Reactions   Ace Inhibitors Swelling    Angioedema    Lisinopril  Swelling    Face and neck swelling   Quetiapine     confusion  I personally reviewed active problem list, medication list, allergies with the patient/caregiver today.   ROS  Ten systems reviewed and is negative except as mentioned in HPI    Objective Physical Exam VITALS: BP- 114/72 CONSTITUTIONAL: Patient appears well-developed and well-nourished. No distress. HEENT: Head atraumatic, normocephalic, neck supple. CARDIOVASCULAR: Normal rate, regular rhythm and normal heart sounds. No murmur heard. No edema in extremities. PULMONARY: Effort normal and breath sounds normal. No respiratory distress. ABDOMINAL: There is no tenderness or distention. MUSCULOSKELETAL: Normal gait. Without gross motor or sensory deficit. PSYCHIATRIC: Patient has a normal mood and affect. Behavior is normal. Judgment and thought content normal.  Vitals:   06/14/24 1341  BP: 114/72  Pulse: 91  Resp: 16  SpO2: 96%  Weight: 154 lb 14.4 oz (70.3 kg)  Height: 5' 4 (1.626 m)    Body mass index is 26.59 kg/m.  Recent Results (from the past 2160 hours)  COMPLETE METABOLIC PANEL WITH GFR     Status: Abnormal   Collection Time: 03/17/24  3:07 PM  Result Value Ref Range   Glucose, Bld 220 (H) 65 - 99 mg/dL    Comment: .            Fasting reference interval . For someone without known diabetes, a glucose value >125 mg/dL indicates that they may have diabetes and this should be confirmed with a follow-up test. .    BUN 18 7 - 25 mg/dL   Creat 1.61 (H) 0.96 - 1.05 mg/dL   BUN/Creatinine Ratio 10 6 - 22 (calc)   Sodium 137 135 - 146 mmol/L   Potassium 4.5 3.5 - 5.3 mmol/L   Chloride 101 98 - 110 mmol/L   CO2 25 20 - 32 mmol/L   Calcium  9.6 8.6 - 10.4 mg/dL   Total Protein 7.3 6.1 - 8.1 g/dL    Albumin 4.4 3.6 - 5.1 g/dL   Globulin 2.9 1.9 - 3.7 g/dL (calc)   AG Ratio 1.5 1.0 - 2.5 (calc)   Total Bilirubin 0.9 0.2 - 1.2 mg/dL   Alkaline phosphatase (APISO) 177 (H) 37 - 153 U/L   AST 49 (H) 10 - 35 U/L   ALT 69 (H) 6 - 29 U/L  Hepatitis, Acute     Status: None   Collection Time: 03/22/24  1:42 PM  Result Value Ref Range   Hep A IgM NON-REACTIVE NON-REACTIVE   Hepatitis B Surface Ag NON-REACTIVE NON-REACTIVE   Hep B C IgM NON-REACTIVE NON-REACTIVE   Hepatitis C Ab NON-REACTIVE NON-REACTIVE    Comment: . HCV antibody was non-reactive. There is no laboratory  evidence of HCV infection. . In most cases, no further action is required. However, if recent HCV exposure is suspected, a test for HCV RNA (test code 04540) is suggested. . For additional information please refer to http://education.[facility].com/faq/FAQ22v1 (This link is being provided for informational/ educational purposes only.) . Aaron Aas For additional information, please refer to  http://education.[facility].com/faq/FAQ202  (This link is being provided for informational/ educational purposes only.) . Aaron Aas For additional information, please refer to  http://education.[facility].com/faq/FAQ202  (This link is being provided for informational/ educational purposes only.) . Aaron Aas For additional information, please refer to  http://education.[facility].com/faq/FAQ202  (This link is being provided for informational/ educational purposes only.) .   COMPLETE METABOLIC PANEL WITH GFR     Status: Abnormal   Collection Time: 03/22/24  1:42 PM  Result Value Ref Range   Glucose, Bld 228 (H) 65 - 99 mg/dL    Comment: .  Fasting reference interval . For someone without known diabetes, a glucose value >125 mg/dL indicates that they may have diabetes and this should be confirmed with a follow-up test. .    BUN 14 7 - 25 mg/dL   Creat 4.09 (H) 8.11 - 1.05 mg/dL   BUN/Creatinine Ratio 8  6 - 22 (calc)   Sodium 135 135 - 146 mmol/L   Potassium 4.0 3.5 - 5.3 mmol/L   Chloride 99 98 - 110 mmol/L   CO2 29 20 - 32 mmol/L   Calcium  8.9 8.6 - 10.4 mg/dL   Total Protein 6.2 6.1 - 8.1 g/dL   Albumin 3.9 3.6 - 5.1 g/dL   Globulin 2.3 1.9 - 3.7 g/dL (calc)   AG Ratio 1.7 1.0 - 2.5 (calc)   Total Bilirubin 0.7 0.2 - 1.2 mg/dL   Alkaline phosphatase (APISO) 212 (H) 37 - 153 U/L   AST 47 (H) 10 - 35 U/L   ALT 63 (H) 6 - 29 U/L  CBC with Differential/Platelet     Status: Abnormal   Collection Time: 03/22/24  1:42 PM  Result Value Ref Range   WBC 9.8 3.8 - 10.8 Thousand/uL   RBC 4.50 3.80 - 5.10 Million/uL   Hemoglobin 12.7 11.7 - 15.5 g/dL   HCT 91.4 78.2 - 95.6 %   MCV 83.8 80.0 - 100.0 fL   MCH 28.2 27.0 - 33.0 pg   MCHC 33.7 32.0 - 36.0 g/dL    Comment: For adults, a slight decrease in the calculated MCHC value (in the range of 30 to 32 g/dL) is most likely not clinically significant; however, it should be interpreted with caution in correlation with other red cell parameters and the patient's clinical condition.    RDW 15.2 (H) 11.0 - 15.0 %   Platelets 119 (L) 140 - 400 Thousand/uL   MPV 11.2 7.5 - 12.5 fL   Neutro Abs 7,526 1,500 - 7,800 cells/uL   Absolute Lymphocytes 1,735 850 - 3,900 cells/uL   Absolute Monocytes 510 200 - 950 cells/uL   Eosinophils Absolute 0 (L) 15 - 500 cells/uL   Basophils Absolute 29 0 - 200 cells/uL   Neutrophils Relative % 76.8 %   Total Lymphocyte 17.7 %   Monocytes Relative 5.2 %   Eosinophils Relative 0.0 %   Basophils Relative 0.3 %   Smear Review      Comment: Review of peripheral smear confirms automated results.   POCT glycosylated hemoglobin (Hb A1C)     Status: Abnormal   Collection Time: 05/17/24 11:08 AM  Result Value Ref Range   Hemoglobin A1C 7.5 (A) 4.0 - 5.6 %   HbA1c POC (<> result, manual entry)     HbA1c, POC (prediabetic range)     HbA1c, POC (controlled diabetic range)    CBC with Differential/Platelet      Status: Abnormal   Collection Time: 05/17/24 11:49 AM  Result Value Ref Range   WBC 11.6 (H) 3.8 - 10.8 Thousand/uL   RBC 4.56 3.80 - 5.10 Million/uL   Hemoglobin 12.6 11.7 - 15.5 g/dL   HCT 21.3 08.6 - 57.8 %   MCV 86.6 80.0 - 100.0 fL   MCH 27.6 27.0 - 33.0 pg   MCHC 31.9 (L) 32.0 - 36.0 g/dL    Comment: For adults, a slight decrease in the calculated MCHC value (in the range of 30 to 32 g/dL) is most likely not clinically significant; however, it should be interpreted with caution in correlation with other  red cell parameters and the patient's clinical condition.    RDW 14.8 11.0 - 15.0 %   Platelets 266 140 - 400 Thousand/uL   MPV 10.6 7.5 - 12.5 fL   Neutro Abs 8,306 (H) 1,500 - 7,800 cells/uL   Absolute Lymphocytes 2,587 850 - 3,900 cells/uL   Absolute Monocytes 673 200 - 950 cells/uL   Eosinophils Absolute 0 (L) 15 - 500 cells/uL   Basophils Absolute 35 0 - 200 cells/uL   Neutrophils Relative % 71.6 %   Total Lymphocyte 22.3 %   Monocytes Relative 5.8 %   Eosinophils Relative 0.0 %   Basophils Relative 0.3 %  Comprehensive metabolic panel with GFR     Status: Abnormal   Collection Time: 05/17/24 11:49 AM  Result Value Ref Range   Glucose, Bld 97 65 - 99 mg/dL    Comment: .            Fasting reference interval .    BUN 9 7 - 25 mg/dL   Creat 0.45 (H) 4.09 - 1.05 mg/dL   eGFR 39 (L) > OR = 60 mL/min/1.76m2   BUN/Creatinine Ratio 6 6 - 22 (calc)   Sodium 143 135 - 146 mmol/L   Potassium 3.8 3.5 - 5.3 mmol/L   Chloride 104 98 - 110 mmol/L   CO2 28 20 - 32 mmol/L   Calcium  9.7 8.6 - 10.4 mg/dL   Total Protein 7.2 6.1 - 8.1 g/dL   Albumin 4.2 3.6 - 5.1 g/dL   Globulin 3.0 1.9 - 3.7 g/dL (calc)   AG Ratio 1.4 1.0 - 2.5 (calc)   Total Bilirubin 0.7 0.2 - 1.2 mg/dL   Alkaline phosphatase (APISO) 90 37 - 153 U/L   AST 17 10 - 35 U/L   ALT 10 6 - 29 U/L  Urine Culture     Status: Abnormal   Collection Time: 06/09/24 12:16 PM   Specimen: Urine  Result Value Ref  Range   MICRO NUMBER: [id number]    SPECIMEN QUALITY: Adequate    Sample Source URINE    STATUS: FINAL    ISOLATE 1: Staphylococcus epidermidis (A)     Comment: 50,000-100,000 CFU/mL of Staphylococcus epidermidis May represent colonizers from external and internal genitalia. No further testing (including susceptibility) will be performed.  POCT urinalysis dipstick     Status: Abnormal   Collection Time: 06/09/24 12:19 PM  Result Value Ref Range   Color, UA yellow    Clarity, UA clear    Glucose, UA Positive (A) Negative   Bilirubin, UA negative    Ketones, UA trace    Spec Grav, UA 1.010 1.010 - 1.025   Blood, UA negative    pH, UA 7.5 5.0 - 8.0   Protein, UA Positive (A) Negative   Urobilinogen, UA 1.0 0.2 or 1.0 E.U./dL   Nitrite, UA negative    Leukocytes, UA Negative Negative   Appearance clear    Odor normal   CBC with Differential     Status: Abnormal   Collection Time: 06/10/24  8:55 PM  Result Value Ref Range   WBC 10.6 (H) 4.0 - 10.5 K/uL   RBC 4.75 3.87 - 5.11 MIL/uL   Hemoglobin 12.9 12.0 - 15.0 g/dL   HCT 29.5 62.1 - 30.8 %   MCV 82.9 80.0 - 100.0 fL   MCH 27.2 26.0 - 34.0 pg   MCHC 32.7 30.0 - 36.0 g/dL   RDW 65.7 84.6 - 96.2 %   Platelets 271 150 -  400 K/uL   nRBC 0.0 0.0 - 0.2 %   Neutrophils Relative % 66 %   Neutro Abs 7.1 1.7 - 7.7 K/uL   Lymphocytes Relative 26 %   Lymphs Abs 2.8 0.7 - 4.0 K/uL   Monocytes Relative 7 %   Monocytes Absolute 0.7 0.1 - 1.0 K/uL   Eosinophils Relative 0 %   Eosinophils Absolute 0.0 0.0 - 0.5 K/uL   Basophils Relative 0 %   Basophils Absolute 0.0 0.0 - 0.1 K/uL   Immature Granulocytes 1 %   Abs Immature Granulocytes 0.06 0.00 - 0.07 K/uL    Comment: Performed at [facility], [address], [place], [place] [address]  Troponin I (High Sensitivity)     Status: None   Collection Time: 06/10/24  8:55 PM  Result Value Ref Range   Troponin I (High Sensitivity) 10 <18 ng/L    Comment: (NOTE) Elevated high  sensitivity troponin I (hsTnI) values and significant  changes across serial measurements may suggest ACS but many other  chronic and acute conditions are known to elevate hsTnI results.  Refer to the Links section for chest pain algorithms and additional  guidance. Performed at [facility], [address] [place], [place] [address]   Basic metabolic panel     Status: Abnormal   Collection Time: 06/10/24  8:55 PM  Result Value Ref Range   Sodium 137 135 - 145 mmol/L   Potassium 2.8 (L) 3.5 - 5.1 mmol/L   Chloride 104 98 - 111 mmol/L   CO2 21 (L) 22 - 32 mmol/L   Glucose, Bld 117 (H) 70 - 99 mg/dL    Comment: Glucose reference range applies only to samples taken after fasting for at least 8 hours.   BUN 9 8 - 23 mg/dL   Creatinine, Ser 5.62 (H) 0.44 - 1.00 mg/dL   Calcium  9.4 8.9 - 10.3 mg/dL   GFR, Estimated 32 (L) >60 mL/min    Comment: (NOTE) Calculated using the CKD-EPI Creatinine Equation (2021)    Anion gap 12 5 - 15    Comment: Performed at [facility], [address] [place], [place] [address]  Magnesium      Status: None   Collection Time: 06/10/24  8:55 PM  Result Value Ref Range   Magnesium  2.1 1.7 - 2.4 mg/dL    Comment: Performed at [facility] [address] [place], [place] [address]  TSH     Status: None   Collection Time: 06/10/24  9:20 PM  Result Value Ref Range   TSH 2.933 0.350 - 4.500 uIU/mL    Comment: Performed by a 3rd Generation assay with a functional sensitivity of <=0.01 uIU/mL. Performed at [facility], [address] [place], [place] [address]   T4, free     Status: None   Collection Time: 06/10/24  9:20 PM  Result Value Ref Range   Free T4 0.81 0.61 - 1.12 ng/dL    Comment: (NOTE) Biotin ingestion may interfere with free T4 tests. If the results are inconsistent with the TSH level, previous test results, or the clinical presentation, then consider biotin interference. If needed, order repeat  testing after stopping biotin. Performed at [facility], [address] [place], [place] [address]   Troponin I (High Sensitivity)     Status: Abnormal   Collection Time: 06/10/24 11:08 PM  Result Value Ref Range   Troponin I (High Sensitivity) 21 (H) <18 ng/L    Comment: (NOTE)  Elevated high sensitivity troponin I (hsTnI) values and significant  changes across serial measurements may suggest ACS but many other  chronic and acute conditions are known to elevate hsTnI results.  Refer to the Links section for chest pain algorithms and additional  guidance. Performed at [facility], [address] [place], [place] [address]   CBG monitoring, ED     Status: None   Collection Time: 06/11/24  2:29 AM  Result Value Ref Range   Glucose-Capillary 83 70 - 99 mg/dL    Comment: Glucose reference range applies only to samples taken after fasting for at least 8 hours.  HIV Antibody (routine testing w rflx)     Status: None   Collection Time: 06/11/24  7:08 AM  Result Value Ref Range   HIV Screen 4th Generation wRfx Non Reactive Non Reactive    Comment: Performed at [facility], [address] [place], [place] [address]  Basic metabolic panel     Status: Abnormal   Collection Time: 06/11/24  7:08 AM  Result Value Ref Range   Sodium 139 135 - 145 mmol/L   Potassium 3.6 3.5 - 5.1 mmol/L   Chloride 109 98 - 111 mmol/L   CO2 26 22 - 32 mmol/L   Glucose, Bld 76 70 - 99 mg/dL    Comment: Glucose reference range applies only to samples taken after fasting for at least 8 hours.   BUN 8 8 - 23 mg/dL   Creatinine, Ser 1.30 (H) 0.44 - 1.00 mg/dL   Calcium  8.0 (L) 8.9 - 10.3 mg/dL   GFR, Estimated 41 (L) >60 mL/min    Comment: (NOTE) Calculated using the CKD-EPI Creatinine Equation (2021)    Anion gap 4 (L) 5 - 15    Comment: Performed at [facility], [address] [place], [place] [address]  CBG monitoring, ED     Status: None   Collection Time:  06/11/24  7:58 AM  Result Value Ref Range   Glucose-Capillary 82 70 - 99 mg/dL    Comment: Glucose reference range applies only to samples taken after fasting for at least 8 hours.  CBG monitoring, ED     Status: None   Collection Time: 06/11/24 12:02 PM  Result Value Ref Range   Glucose-Capillary 83 70 - 99 mg/dL    Comment: Glucose reference range applies only to samples taken after fasting for at least 8 hours.  Glucose, capillary     Status: Abnormal   Collection Time: 06/11/24  4:21 PM  Result Value Ref Range   Glucose-Capillary 111 (H) 70 - 99 mg/dL    Comment: Glucose reference range applies only to samples taken after fasting for at least 8 hours.  Glucose, capillary     Status: Abnormal   Collection Time: 06/11/24  8:48 PM  Result Value Ref Range   Glucose-Capillary 114 (H) 70 - 99 mg/dL    Comment: Glucose reference range applies only to samples taken after fasting for at least 8 hours.  Glucose, capillary     Status: None   Collection Time: 06/12/24  8:09 AM  Result Value Ref Range   Glucose-Capillary 76 70 - 99 mg/dL    Comment: Glucose reference range applies only to samples taken after fasting for at least 8 hours.    Diabetic Foot Exam:     PHQ2/9:    06/14/2024    1:37 PM 06/09/2024   11:30 AM 05/17/2024   10:53 AM 05/05/2024  11:04 AM 03/17/2024    2:03 PM  Depression screen PHQ 2/9  Decreased Interest 0 0 0 0 0  Down, Depressed, Hopeless 0 0 0 0 0  PHQ - 2 Score 0 0 0 0 0  Altered sleeping 0 0 0 0 0  Tired, decreased energy 0 0 0 0 0  Change in appetite 0 0 0 0 0  Feeling bad or failure about yourself  0 0 0 0 0  Trouble concentrating 0 0 0 0 0  Moving slowly or fidgety/restless 0 0 0 0 0  Suicidal thoughts 0 0 0 0 0  PHQ-9 Score 0 0 0 0 0  Difficult doing work/chores Not difficult at all Not difficult at all Not difficult at all Not difficult at all Not difficult at all    phq 9 is negative  Fall Risk:    06/14/2024    1:37 PM 06/09/2024   11:30  AM 05/17/2024   10:52 AM 05/05/2024   10:59 AM 04/12/2024   12:12 PM  Fall Risk   Falls in the past year? 1 1 0 0 0  Number falls in past yr: 0 0 0 0 0  Injury with Fall? 1 1 0 0   Risk for fall due to : Impaired balance/gait Impaired balance/gait No Fall Risks No Fall Risks Impaired mobility;Impaired balance/gait;Medication side effect  Follow up Education provided;Falls evaluation completed;Falls prevention discussed Falls evaluation completed Falls prevention discussed;Education provided;Falls evaluation completed Falls prevention discussed;Falls evaluation completed Falls prevention discussed    Assessment & Plan Supraventricular tachycardia Recent episode resolved with adenosine, likely due to missed diltiazem  doses. Currently controlled with medication. - Continue diltiazem  as prescribed. - Ensure medication adherence. - Follow up with cardiologist for potential medication adjustments.  Hypertrophic cardiomyopathy Managed with metoprolol  and diltiazem . Blood pressure well-controlled. - Continue metoprolol  half pill as prescribed. - Continue diltiazem  as prescribed. - Monitor blood pressure regularly. - keep follow up with cardiologist   Chronic kidney disease, stage 3A Recent decrease in kidney function improved before discharge. Requires monitoring. - Check kidney function with BMP.  Hypokalemia Corrected with supplementation. - Check potassium levels with BMP. - Continue daily potassium supplementation as prescribed.

## 2024-06-15 ENCOUNTER — Ambulatory Visit: Admitting: Family Medicine

## 2024-06-15 ENCOUNTER — Ambulatory Visit: Payer: Self-pay | Admitting: Family Medicine

## 2024-06-15 DIAGNOSIS — R0602 Shortness of breath: Secondary | ICD-10-CM | POA: Diagnosis not present

## 2024-06-15 LAB — LAB REPORT - SCANNED: A1c: 6.7

## 2024-06-15 LAB — BASIC METABOLIC PANEL WITHOUT GFR
BUN/Creatinine Ratio: 4 (calc) — ABNORMAL LOW (ref 6–22)
BUN: 6 mg/dL — ABNORMAL LOW (ref 7–25)
CO2: 26 mmol/L (ref 20–32)
Calcium: 10.3 mg/dL (ref 8.6–10.4)
Chloride: 105 mmol/L (ref 98–110)
Creat: 1.55 mg/dL — ABNORMAL HIGH (ref 0.50–1.05)
Glucose, Bld: 78 mg/dL (ref 65–99)
Potassium: 4.6 mmol/L (ref 3.5–5.3)
Sodium: 139 mmol/L (ref 135–146)

## 2024-06-15 NOTE — Telephone Encounter (Signed)
 Duplicate, refilled on 06/09/24.  Requested Prescriptions  Pending Prescriptions Disp Refills   OZEMPIC , 0.25 OR 0.5 MG/DOSE, 2 MG/3ML SOPN [Pharmacy Med Name: OZEMPIC  0.25/0.5MG  2 Solution Pen-injector] 3 mL 11    Sig: INJECT 0.5MG  SUBCUTANEOUSLY ONCE WEEKLY     Endocrinology:  Diabetes - GLP-1 Receptor Agonists - semaglutide  Failed - 06/15/2024  8:39 AM      Failed - HBA1C in normal range and within 180 days    Hemoglobin A1C  Date Value Ref Range Status  05/17/2024 7.5 (A) 4.0 - 5.6 % Final  07/08/2012 6.1 4.2 - 6.3 % Final    Comment:    The American Diabetes Association recommends that a primary goal of therapy should be <7% and that physicians should reevaluate the treatment regimen in patients with HbA1c values consistently >8%.    HbA1c, POC (prediabetic range)  Date Value Ref Range Status  09/28/2018 6.2 5.7 - 6.4 % Final   HbA1c, POC (controlled diabetic range)  Date Value Ref Range Status  01/09/2019 5.9 0.0 - 7.0 % Final   Hgb A1c MFr Bld  Date Value Ref Range Status  01/29/2020 5.8 (H) 4.8 - 5.6 % Final    Comment:    (NOTE) Pre diabetes:          5.7%-6.4% Diabetes:              >6.4% Glycemic control for   <7.0% adults with diabetes          Failed - Cr in normal range and within 360 days    Creat  Date Value Ref Range Status  06/14/2024 1.55 (H) 0.50 - 1.05 mg/dL Final    Comment:    Verified by repeat analysis. .    Creatinine, Urine  Date Value Ref Range Status  01/18/2024 135 20 - 275 mg/dL Final         Passed - Valid encounter within last 6 months    Recent Outpatient Visits           Yesterday SVT (supraventricular tachycardia) [facility])   [place] Brandy Lacer, MD   6 days ago Loss of appetite   [facility] Brandy Hone, PA-C   4 weeks ago Dyslipidemia associated with type 2 diabetes mellitus [facility]   [place] [facility] Johnston, Krichna, MD   2 months ago  Elevated liver enzymes   [facility] Johnston, Krichna, MD   3 months ago Hospital discharge follow-up   [facility] Brandy Lacer, MD       Future Appointments             In 3 months Brandy Johnston, Krichna, MD [facility], [facility] [place] [facility]

## 2024-06-16 DIAGNOSIS — E7800 Pure hypercholesterolemia, unspecified: Secondary | ICD-10-CM | POA: Diagnosis not present

## 2024-06-16 DIAGNOSIS — D571 Sickle-cell disease without crisis: Secondary | ICD-10-CM | POA: Diagnosis not present

## 2024-06-16 DIAGNOSIS — I10 Essential (primary) hypertension: Secondary | ICD-10-CM | POA: Diagnosis not present

## 2024-06-16 DIAGNOSIS — E1122 Type 2 diabetes mellitus with diabetic chronic kidney disease: Secondary | ICD-10-CM | POA: Diagnosis not present

## 2024-06-16 DIAGNOSIS — Z79899 Other long term (current) drug therapy: Secondary | ICD-10-CM | POA: Diagnosis not present

## 2024-06-16 DIAGNOSIS — I129 Hypertensive chronic kidney disease with stage 1 through stage 4 chronic kidney disease, or unspecified chronic kidney disease: Secondary | ICD-10-CM | POA: Diagnosis not present

## 2024-06-16 DIAGNOSIS — Z794 Long term (current) use of insulin: Secondary | ICD-10-CM | POA: Diagnosis not present

## 2024-06-16 DIAGNOSIS — R809 Proteinuria, unspecified: Secondary | ICD-10-CM | POA: Diagnosis not present

## 2024-06-16 DIAGNOSIS — K219 Gastro-esophageal reflux disease without esophagitis: Secondary | ICD-10-CM | POA: Diagnosis not present

## 2024-06-16 DIAGNOSIS — I471 Supraventricular tachycardia, unspecified: Secondary | ICD-10-CM | POA: Diagnosis not present

## 2024-06-16 DIAGNOSIS — E1129 Type 2 diabetes mellitus with other diabetic kidney complication: Secondary | ICD-10-CM | POA: Diagnosis not present

## 2024-06-16 DIAGNOSIS — I422 Other hypertrophic cardiomyopathy: Secondary | ICD-10-CM | POA: Diagnosis not present

## 2024-06-16 DIAGNOSIS — E1142 Type 2 diabetes mellitus with diabetic polyneuropathy: Secondary | ICD-10-CM | POA: Diagnosis not present

## 2024-06-22 ENCOUNTER — Other Ambulatory Visit: Payer: Self-pay | Admitting: Family Medicine

## 2024-06-22 DIAGNOSIS — R790 Abnormal level of blood mineral: Secondary | ICD-10-CM

## 2024-06-26 DIAGNOSIS — E1122 Type 2 diabetes mellitus with diabetic chronic kidney disease: Secondary | ICD-10-CM | POA: Diagnosis not present

## 2024-06-26 DIAGNOSIS — D573 Sickle-cell trait: Secondary | ICD-10-CM | POA: Diagnosis not present

## 2024-06-26 DIAGNOSIS — K219 Gastro-esophageal reflux disease without esophagitis: Secondary | ICD-10-CM | POA: Diagnosis not present

## 2024-06-26 DIAGNOSIS — I422 Other hypertrophic cardiomyopathy: Secondary | ICD-10-CM | POA: Diagnosis not present

## 2024-06-26 DIAGNOSIS — Z794 Long term (current) use of insulin: Secondary | ICD-10-CM | POA: Diagnosis not present

## 2024-06-26 DIAGNOSIS — I421 Obstructive hypertrophic cardiomyopathy: Secondary | ICD-10-CM | POA: Diagnosis not present

## 2024-06-26 DIAGNOSIS — E1142 Type 2 diabetes mellitus with diabetic polyneuropathy: Secondary | ICD-10-CM | POA: Diagnosis not present

## 2024-06-26 DIAGNOSIS — R0600 Dyspnea, unspecified: Secondary | ICD-10-CM | POA: Diagnosis not present

## 2024-06-26 DIAGNOSIS — N1831 Chronic kidney disease, stage 3a: Secondary | ICD-10-CM | POA: Diagnosis not present

## 2024-06-27 DIAGNOSIS — I422 Other hypertrophic cardiomyopathy: Secondary | ICD-10-CM | POA: Diagnosis not present

## 2024-06-28 DIAGNOSIS — I422 Other hypertrophic cardiomyopathy: Secondary | ICD-10-CM | POA: Diagnosis not present

## 2024-07-03 ENCOUNTER — Other Ambulatory Visit: Payer: Self-pay | Admitting: Family Medicine

## 2024-07-03 NOTE — Telephone Encounter (Unsigned)
 Copied from CRM [id number]. Topic: Clinical - Medication Refill >> Jul 03, 2024 10:13 AM Travis F wrote: Medication: Continuous Glucose Receiver (FREESTYLE LIBRE 3 READER) DEVI [[id number]],   Has the patient contacted their pharmacy? Yes  (Agent: If yes, when and what did the pharmacy advise?) Pharmacy called in refill   This is the patient's preferred pharmacy:  [facility], [place] - [address] [place] [address] Phone: [phone] Fax: [phone]   Is this the correct pharmacy for this prescription? Yes If no, delete pharmacy and type the correct one.   Has the prescription been filled recently? Yes  Is the patient out of the medication? Yes  Has the patient been seen for an appointment in the last year OR does the patient have an upcoming appointment? Yes  Can we respond through MyChart? Yes  Agent: Please be advised that Rx refills may take up to 3 business days. We ask that you follow-up with your pharmacy.

## 2024-07-04 DIAGNOSIS — I422 Other hypertrophic cardiomyopathy: Secondary | ICD-10-CM | POA: Diagnosis not present

## 2024-07-07 ENCOUNTER — Other Ambulatory Visit: Payer: Self-pay | Admitting: Family Medicine

## 2024-07-07 DIAGNOSIS — R630 Anorexia: Secondary | ICD-10-CM

## 2024-07-07 DIAGNOSIS — J42 Unspecified chronic bronchitis: Secondary | ICD-10-CM

## 2024-07-07 DIAGNOSIS — T887XXA Unspecified adverse effect of drug or medicament, initial encounter: Secondary | ICD-10-CM

## 2024-07-07 DIAGNOSIS — J45909 Unspecified asthma, uncomplicated: Secondary | ICD-10-CM

## 2024-07-07 DIAGNOSIS — R062 Wheezing: Secondary | ICD-10-CM

## 2024-07-07 DIAGNOSIS — R053 Chronic cough: Secondary | ICD-10-CM

## 2024-07-07 DIAGNOSIS — I129 Hypertensive chronic kidney disease with stage 1 through stage 4 chronic kidney disease, or unspecified chronic kidney disease: Secondary | ICD-10-CM

## 2024-07-07 NOTE — Telephone Encounter (Signed)
 Copied from CRM [id number]. Topic: Clinical - Medication Refill >> Jul 07, 2024  3:10 PM Wess S wrote: Medication: Semaglutide ,0.25 or 0.5MG /DOS, (OZEMPIC , 0.25 OR 0.5 MG/DOSE,) 2 MG/3ML SOPN  ipratropium-albuterol  (DUONEB) 0.5-2.5 (3) MG/3ML SOLN   Has the patient contacted their pharmacy? Yes (Agent: If no, request that the patient contact the pharmacy for the refill. If patient does not wish to contact the pharmacy document the reason why and proceed with request.) (Agent: If yes, when and what did the pharmacy advise?)  This is the patient's preferred pharmacy:   [facility] [place]  [place], [place] - [address] [place] [address] Phone: [phone] Fax: [phone]  Is this the correct pharmacy for this prescription? Yes If no, delete pharmacy and type the correct one.   Has the prescription been filled recently? Yes  Is the patient out of the medication? Yes  Has the patient been seen for an appointment in the last year OR does the patient have an upcoming appointment? Yes  Can we respond through MyChart? Yes  Agent: Please be advised that Rx refills may take up to 3 business days. We ask that you follow-up with your pharmacy.

## 2024-07-08 DIAGNOSIS — I472 Ventricular tachycardia, unspecified: Secondary | ICD-10-CM | POA: Diagnosis not present

## 2024-07-08 DIAGNOSIS — I471 Supraventricular tachycardia, unspecified: Secondary | ICD-10-CM | POA: Diagnosis not present

## 2024-07-10 DIAGNOSIS — K219 Gastro-esophageal reflux disease without esophagitis: Secondary | ICD-10-CM | POA: Diagnosis not present

## 2024-07-10 DIAGNOSIS — R1310 Dysphagia, unspecified: Secondary | ICD-10-CM | POA: Diagnosis not present

## 2024-07-10 MED ORDER — OZEMPIC (0.25 OR 0.5 MG/DOSE) 2 MG/3ML ~~LOC~~ SOPN: 0.5000 mg | PEN_INJECTOR | SUBCUTANEOUS | 0 refills | Status: DC

## 2024-07-10 MED ORDER — IPRATROPIUM-ALBUTEROL 0.5-2.5 (3) MG/3ML IN SOLN: 3.0000 mL | Freq: Four times a day (QID) | RESPIRATORY_TRACT | 1 refills | Status: DC | PRN

## 2024-07-10 NOTE — Telephone Encounter (Signed)
 Requested Prescriptions  Pending Prescriptions Disp Refills   Semaglutide ,0.25 or 0.5MG /DOS, (OZEMPIC , 0.25 OR 0.5 MG/DOSE,) 2 MG/3ML SOPN 3 mL 0    Sig: Inject 0.5 mg into the skin once a week.     Endocrinology:  Diabetes - GLP-1 Receptor Agonists - semaglutide  Failed - 07/10/2024  2:10 PM      Failed - HBA1C in normal range and within 180 days    Hemoglobin A1C  Date Value Ref Range Status  05/17/2024 7.5 (A) 4.0 - 5.6 % Final  07/08/2012 6.1 4.2 - 6.3 % Final    Comment:    The American Diabetes Association recommends that a primary goal of therapy should be <7% and that physicians should reevaluate the treatment regimen in patients with HbA1c values consistently >8%.    HbA1c, POC (prediabetic range)  Date Value Ref Range Status  09/28/2018 6.2 5.7 - 6.4 % Final   HbA1c, POC (controlled diabetic range)  Date Value Ref Range Status  01/09/2019 5.9 0.0 - 7.0 % Final   Hgb A1c MFr Bld  Date Value Ref Range Status  01/29/2020 5.8 (H) 4.8 - 5.6 % Final    Comment:    (NOTE) Pre diabetes:          5.7%-6.4% Diabetes:              >6.4% Glycemic control for   <7.0% adults with diabetes          Failed - Cr in normal range and within 360 days    Creat  Date Value Ref Range Status  06/14/2024 1.55 (H) 0.50 - 1.05 mg/dL Final    Comment:    Verified by repeat analysis. .    Creatinine, Urine  Date Value Ref Range Status  01/18/2024 135 20 - 275 mg/dL Final         Passed - Valid encounter within last 6 months    Recent Outpatient Visits           3 weeks ago SVT (supraventricular tachycardia) [facility])   [place] [facility] Glen[place] Mire, MD   1 month ago Loss of appetite   [facility] Leavy Mole, PA-C   1 month ago Dyslipidemia associated with type 2 diabetes mellitus [facility])   [place] [facility] Glenard Mire, MD   3 months ago Elevated liver enzymes   [facility]  Sowles, Krichna, MD   3 months ago Hospital discharge follow-up   [facility] Glenard Mire, MD       Future Appointments             In 2 months Sowles, Krichna, MD [facility]             ipratropium-albuterol  (DUONEB) 0.5-2.5 (3) MG/3ML SOLN 360 mL 1    Sig: Take 3 mLs by nebulization every 6 (six) hours as needed.     Pulmonology:  Combination Products - albuterol  / ipratropium Passed - 07/10/2024  2:10 PM      Passed - Last BP in normal range    BP Readings from Last 1 Encounters:  06/14/24 114/72         Passed - Last Heart Rate in normal range    Pulse Readings from Last 1 Encounters:  06/14/24 91         Passed - Valid encounter within last 12 months    Recent Outpatient Visits  3 weeks ago SVT (supraventricular tachycardia) [facility])   [facility] Glenard Mire, MD   1 month ago Loss of appetite   [facility] Leavy Mole, PA-C   1 month ago Dyslipidemia associated with type 2 diabetes mellitus [facility])   [facility] Glenard Mire, MD   3 months ago Elevated liver enzymes   [facility] Sowles, Krichna, MD   3 months ago Hospital discharge follow-up   [facility] Glenard Mire, MD       Future Appointments             In 2 months Glenard, Krichna, MD [facility], [facility]

## 2024-07-11 ENCOUNTER — Encounter: Payer: Self-pay | Admitting: Family Medicine

## 2024-07-11 ENCOUNTER — Ambulatory Visit: Admitting: Family Medicine

## 2024-07-11 VITALS — BP 104/64 | HR 77 | Resp 16 | Ht 64.0 in | Wt 150.5 lb

## 2024-07-11 DIAGNOSIS — I2510 Atherosclerotic heart disease of native coronary artery without angina pectoris: Secondary | ICD-10-CM

## 2024-07-11 DIAGNOSIS — I422 Other hypertrophic cardiomyopathy: Secondary | ICD-10-CM | POA: Diagnosis not present

## 2024-07-11 DIAGNOSIS — T887XXA Unspecified adverse effect of drug or medicament, initial encounter: Secondary | ICD-10-CM

## 2024-07-11 DIAGNOSIS — R630 Anorexia: Secondary | ICD-10-CM | POA: Diagnosis not present

## 2024-07-11 DIAGNOSIS — E1122 Type 2 diabetes mellitus with diabetic chronic kidney disease: Secondary | ICD-10-CM

## 2024-07-11 DIAGNOSIS — I129 Hypertensive chronic kidney disease with stage 1 through stage 4 chronic kidney disease, or unspecified chronic kidney disease: Secondary | ICD-10-CM | POA: Diagnosis not present

## 2024-07-11 DIAGNOSIS — N183 Chronic kidney disease, stage 3 unspecified: Secondary | ICD-10-CM | POA: Diagnosis not present

## 2024-07-11 MED ORDER — OZEMPIC (0.25 OR 0.5 MG/DOSE) 2 MG/3ML ~~LOC~~ SOPN: 0.5000 mg | PEN_INJECTOR | SUBCUTANEOUS | 1 refills | Status: DC

## 2024-07-11 NOTE — Progress Notes (Signed)
 Name: Brandy Johnston   MRN: [id number]    DOB: [birth year]   Date:07/11/2024       Progress Note  Subjective  Chief Complaint  Chief Complaint  Patient presents with   Medical Management of Chronic Issues    Discuss about HEART CATHETERIZATION     Discussed the use of AI scribe software for clinical note transcription with the patient, who gave verbal consent to proceed.  History of Present Illness Brandy Johnston is a 63 year old female with hypertrophic cardiomyopathy who presents with shortness of breath.  She has been experiencing shortness of breath and has a known history of hypertrophic cardiomyopathy. A left-sided cardiac catheterization was performed, revealing no significant coronary artery disease or blockages, although there is some calcification and tortuosity in the coronary arteries. The right coronary artery is slightly large in caliber and calcified, but no significant stenosis was noted.  Her current medications include Cardizem  and metoprolol , which are being used to manage her symptoms. Despite these medications, she continues to experience shortness of breath and has been hospitalized multiple times. She also uses a nebulizer for asthma management.  She denies chest pain but reports a fluttering sensation in her heart. Her shortness of breath affects her daily activities, such as grocery shopping, and she feels 'miserable' due to these limitations.    Patient Active Problem List   Diagnosis Date Noted   History of angiotensin converting enzyme inhibitor (ACE-I) allergy 06/11/2024   Prolonged QT interval 06/11/2024   SVT (supraventricular tachycardia) (HCC) 06/11/2024   Esophageal dysphagia 05/17/2024   Migraine without aura, intractable, with status migrainosus 05/17/2024   Hypertrophic cardiomyopathy (HCC) 05/17/2024   Dyslipidemia associated with type 2 diabetes mellitus (HCC) 05/17/2024   Small vessel disease (HCC) 01/26/2024   Severe persistent asthma 01/18/2024    Muscle spasms of both lower extremities 01/18/2024   Chronic left shoulder pain 11/09/2022   Hyperlipidemia 01/14/2021   Incomplete tear of left rotator cuff 07/03/2020   Angioedema due to angiotensin converting enzyme inhibitor (ACE-I) 01/29/2020   Chronic bilateral back pain 07/26/2017   Coronary artery calcification 05/17/2017   Heart palpitations 05/15/2017   History of acute gastritis    Atherosclerosis of abdominal aorta (HCC) 08/18/2016   Type 2 diabetes mellitus with stage 3 chronic kidney disease and hypertension (HCC) 12/16/2015   Diabetic neuropathy associated with type 2 diabetes mellitus (HCC) 09/18/2015   Insomnia 09/18/2015   Benign essential HTN 06/18/2015   Stage 3b chronic kidney disease (HCC) 06/18/2015   Diabetes mellitus with renal manifestation (HCC) 06/18/2015   Obesity (BMI 30.0-34.9) 06/18/2015   Degenerative arthritis of hip 06/18/2015   Sickle cell trait (HCC) 06/18/2015   Gastroesophageal reflux disease 05/25/2008    Past Surgical History:  Procedure Laterality Date   BREAST BIOPSY Right 12/28/2019   stereo [facility] stromal fibrosis   CHOLECYSTECTOMY     COLONOSCOPY     ESOPHAGOGASTRODUODENOSCOPY (EGD) WITH PROPOFOL  N/A 10/05/2016   Procedure: ESOPHAGOGASTRODUODENOSCOPY (EGD) WITH PROPOFOL ;  Surgeon: Rogelia Copping, MD;  Location: [facility];  Service: Endoscopy;  Laterality: N/A;   FRACTURE SURGERY Left    cast and pins    SHOULDER ARTHROSCOPY WITH ROTATOR CUFF REPAIR AND SUBACROMIAL DECOMPRESSION Left 12/07/2018   Procedure: left shoulder manipulation under anesthesia, left shoulder arthroscopic lysis of adhesions;  Surgeon: Leora Lynwood SAUNDERS, MD;  Location: [facility];  Service: Orthopedics;  Laterality: Left;   SHOULDER CLOSED REDUCTION Left 12/07/2018   Procedure: CLOSED MANIPULATION SHOULDER;  Surgeon: Leora Lynwood  R, MD;  Location: [facility];  Service: Orthopedics;  Laterality: Left;   shoulder surgery  Left 06/10/2018   Dr. Seward   TUBAL  LIGATION      Family History  Problem Relation Age of Onset   Migraines Mother    Diabetes Mother    Cancer Mother        lung   Arthritis Brother    Breast cancer Maternal Aunt    Cancer Maternal Uncle        Lung and Colon   Cirrhosis Brother    Breast cancer Cousin     Social History   Tobacco Use   Smoking status: Never   Smokeless tobacco: Never  Substance Use Topics   Alcohol use: Yes    Alcohol/week: 0.0 standard drinks of alcohol    Comment: rare     Current Outpatient Medications:    albuterol  (VENTOLIN  HFA) 108 (90 Base) MCG/ACT inhaler, SMARTSIG:2 Puff(s) By Mouth Every 4-6 Hours PRN, Disp: , Rfl:    aspirin  (ASPIRIN  81) 81 MG chewable tablet, Chew 1 tablet (81 mg total) by mouth daily., Disp: 30 tablet, Rfl: 0   Atogepant  (QULIPTA ) 30 MG TABS, Take 1 tablet (30 mg total) by mouth every morning., Disp: 90 tablet, Rfl: 1   Baclofen  5 MG TABS, Take 1 tablet (5 mg total) by mouth 3 (three) times daily as needed (muscleskeletal pain or spasms). Sedating, do not take with other sedating medications and do not take and drive, Disp: 60 tablet, Rfl: 0   benralizumab (FASENRA PEN) 30 MG/ML prefilled autoinjector, Inject into the skin every 30 (thirty) days., Disp: , Rfl:    Blood Glucose Monitoring Suppl (CONTOUR NEXT ONE) KIT, USE TO TEST BLOOD SUGAR ONCE D, Disp: , Rfl:    budesonide -glycopyrrolate -formoterol  (BREZTRI  AEROSPHERE) 160-9-4.8 MCG/ACT AERO inhaler, Inhale 2 puffs into the lungs 2 (two) times daily., Disp: , Rfl:    Cholecalciferol (VITAMIN D ) 2000 units CAPS, Take 1 capsule (2,000 Units total) by mouth daily., Disp: 30 capsule, Rfl: 0   Continuous Glucose Receiver (FREESTYLE LIBRE 3 READER) DEVI, as directed., Disp: , Rfl:    Continuous Glucose Sensor (FREESTYLE LIBRE 3 SENSOR) MISC, 1 Device by Does not apply route every 14 (fourteen) days., Disp: 6 each, Rfl: 1   cyclobenzaprine  (FLEXERIL ) 5 MG tablet, Take 1 tablet (5 mg total) by mouth at bedtime., Disp:  90 tablet, Rfl: 1   diclofenac  Sodium (VOLTAREN ) 1 % GEL, Apply 2 g topically 4 (four) times daily. (Patient taking differently: Apply 2 g topically 4 (four) times daily as needed.), Disp: 100 g, Rfl: 2   diltiazem  (CARDIZEM  CD) 240 MG 24 hr capsule, Take 1 capsule by mouth daily., Disp: , Rfl:    empagliflozin  (JARDIANCE ) 25 MG TABS tablet, Take 1 tablet (25 mg total) by mouth daily before breakfast., Disp: 90 tablet, Rfl: 1   EPINEPHrine  0.3 mg/0.3 mL IJ SOAJ injection, Inject into the muscle., Disp: , Rfl:    ezetimibe  (ZETIA ) 10 MG tablet, Take 1 tablet (10 mg total) by mouth daily., Disp: 90 tablet, Rfl: 1   fluticasone  (FLONASE) 50 MCG/ACT nasal spray, 2 sprays each nostril Daily. DISP#  1 bottles = 1 month supply., Disp: , Rfl:    gabapentin  (NEURONTIN ) 300 MG capsule, Take 1 capsule (300 mg total) by mouth 3 (three) times daily., Disp: 270 capsule, Rfl: 1   glucose blood test strip, Use as instructed, Disp: 100 each, Rfl: 12   insulin  NPH Human (NOVOLIN N) 100  UNIT/ML injection, Inject into the skin as needed., Disp: , Rfl:    ipratropium-albuterol  (DUONEB) 0.5-2.5 (3) MG/3ML SOLN, Take 3 mLs by nebulization every 6 (six) hours as needed., Disp: 360 mL, Rfl: 1   levocetirizine (XYZAL ) 5 MG tablet, Take 1 tablet (5 mg total) by mouth every evening., Disp: 90 tablet, Rfl: 1   magnesium  oxide (MAG-OX) 400 MG tablet, TAKE 1 TABLET (400 MG TOTAL) BY MOUTH 2 (TWO) TIMES DAILY., Disp: 180 tablet, Rfl: 0   metoprolol  succinate (TOPROL -XL) 25 MG 24 hr tablet, TAKE 1/2 TABLET BY MOUTH DAILY, Disp: 45 tablet, Rfl: 0   Microlet Lancets MISC, USE TO CHECK BLOOD SUGAR ONCE D, Disp: , Rfl:    montelukast  (SINGULAIR ) 10 MG tablet, Take 10 mg by mouth daily., Disp: , Rfl:    ondansetron  (ZOFRAN -ODT) 4 MG disintegrating tablet, Take 1 tablet (4 mg total) by mouth every 8 (eight) hours as needed for nausea., Disp: 30 tablet, Rfl: 0   pantoprazole  (PROTONIX ) 40 MG tablet, Take 1 tablet (40 mg total) by mouth 2  (two) times daily., Disp: 180 tablet, Rfl: 0   potassium chloride  SA (KLOR-CON  M) 20 MEQ tablet, Take 1 tablet (20 mEq total) by mouth daily., Disp: 30 tablet, Rfl: 1   rosuvastatin  (CRESTOR ) 40 MG tablet, Take 1 tablet (40 mg total) by mouth daily., Disp: 90 tablet, Rfl: 1   Semaglutide ,0.25 or 0.5MG /DOS, (OZEMPIC , 0.25 OR 0.5 MG/DOSE,) 2 MG/3ML SOPN, Inject 0.5 mg into the skin once a week., Disp: 3 mL, Rfl: 0   Sharps Container (BD SHARPS COLLECTOR) MISC, Use as directed to dispose of Fasenra pen, Disp: , Rfl:    sodium chloride  0.9 % nebulizer solution, Inhale 3 mLs into the lungs 2 (two) times daily., Disp: , Rfl:    Spacer/Aero-Holding Chambers (OPTICHAMBER DIAMOND) MISC, , Disp: , Rfl:    triamcinolone  cream (KENALOG ) 0.1 %, Apply 1 Application topically as needed., Disp: , Rfl:    Ubrogepant  (UBRELVY ) 100 MG TABS, Take 1 tablet (100 mg total) by mouth daily as needed., Disp: 48 tablet, Rfl: 0  Allergies  Allergen Reactions   Ace Inhibitors Swelling    Angioedema    Lisinopril  Swelling    Face and neck swelling   Quetiapine     confusion    I personally reviewed active problem list, medication list, allergies with the patient/caregiver today.   ROS  Ten systems reviewed and is negative except as mentioned in HPI    Objective Physical Exam  CONSTITUTIONAL: Patient appears well-developed and well-nourished.  No distress. HEENT: Head atraumatic, normocephalic, neck supple. CARDIOVASCULAR: Normal rate, regular rhythm and normal heart sounds.  No murmur heard. No BLE edema. PULMONARY: Effort normal and breath sounds normal. No respiratory distress. ABDOMINAL: There is no tenderness or distention. MUSCULOSKELETAL: Normal gait. Without gross motor or sensory deficit. PSYCHIATRIC: Patient has a normal mood and affect. behavior is normal. Judgment and thought content normal.  Vitals:   07/11/24 0934  BP: 104/64  Pulse: 77  Resp: 16  SpO2: 99%  Weight: 150 lb 8 oz (68.3 kg)   Height: 5' 4 (1.626 m)    Body mass index is 25.83 kg/m.  Recent Results (from the past 2160 hours)  POCT glycosylated hemoglobin (Hb A1C)     Status: Abnormal   Collection Time: 05/17/24 11:08 AM  Result Value Ref Range   Hemoglobin A1C 7.5 (A) 4.0 - 5.6 %   HbA1c POC (<> result, manual entry)     HbA1c, POC (  prediabetic range)     HbA1c, POC (controlled diabetic range)    CBC with Differential/Platelet     Status: Abnormal   Collection Time: 05/17/24 11:49 AM  Result Value Ref Range   WBC 11.6 (H) 3.8 - 10.8 Thousand/uL   RBC 4.56 3.80 - 5.10 Million/uL   Hemoglobin 12.6 11.7 - 15.5 g/dL   HCT 60.4 64.9 - 54.9 %   MCV 86.6 80.0 - 100.0 fL   MCH 27.6 27.0 - 33.0 pg   MCHC 31.9 (L) 32.0 - 36.0 g/dL    Comment: For adults, a slight decrease in the calculated MCHC value (in the range of 30 to 32 g/dL) is most likely not clinically significant; however, it should be interpreted with caution in correlation with other red cell parameters and the patient's clinical condition.    RDW 14.8 11.0 - 15.0 %   Platelets 266 140 - 400 Thousand/uL   MPV 10.6 7.5 - 12.5 fL   Neutro Abs 8,306 (H) 1,500 - 7,800 cells/uL   Absolute Lymphocytes 2,587 850 - 3,900 cells/uL   Absolute Monocytes 673 200 - 950 cells/uL   Eosinophils Absolute 0 (L) 15 - 500 cells/uL   Basophils Absolute 35 0 - 200 cells/uL   Neutrophils Relative % 71.6 %   Total Lymphocyte 22.3 %   Monocytes Relative 5.8 %   Eosinophils Relative 0.0 %   Basophils Relative 0.3 %  Comprehensive metabolic panel with GFR     Status: Abnormal   Collection Time: 05/17/24 11:49 AM  Result Value Ref Range   Glucose, Bld 97 65 - 99 mg/dL    Comment: .            Fasting reference interval .    BUN 9 7 - 25 mg/dL   Creat 8.49 (H) 9.49 - 1.05 mg/dL   eGFR 39 (L) > OR = 60 mL/min/1.38m2   BUN/Creatinine Ratio 6 6 - 22 (calc)   Sodium 143 135 - 146 mmol/L   Potassium 3.8 3.5 - 5.3 mmol/L   Chloride 104 98 - 110 mmol/L   CO2 28  20 - 32 mmol/L   Calcium  9.7 8.6 - 10.4 mg/dL   Total Protein 7.2 6.1 - 8.1 g/dL   Albumin 4.2 3.6 - 5.1 g/dL   Globulin 3.0 1.9 - 3.7 g/dL (calc)   AG Ratio 1.4 1.0 - 2.5 (calc)   Total Bilirubin 0.7 0.2 - 1.2 mg/dL   Alkaline phosphatase (APISO) 90 37 - 153 U/L   AST 17 10 - 35 U/L   ALT 10 6 - 29 U/L  Urine Culture     Status: Abnormal   Collection Time: 06/09/24 12:16 PM   Specimen: Urine  Result Value Ref Range   MICRO NUMBER: [id number]    SPECIMEN QUALITY: Adequate    Sample Source URINE    STATUS: FINAL    ISOLATE 1: Staphylococcus epidermidis (A)     Comment: 50,000-100,000 CFU/mL of Staphylococcus epidermidis May represent colonizers from external and internal genitalia. No further testing (including susceptibility) will be performed.  POCT urinalysis dipstick     Status: Abnormal   Collection Time: 06/09/24 12:19 PM  Result Value Ref Range   Color, UA yellow    Clarity, UA clear    Glucose, UA Positive (A) Negative   Bilirubin, UA negative    Ketones, UA trace    Spec Grav, UA 1.010 1.010 - 1.025   Blood, UA negative    pH, UA 7.5 5.0 -  8.0   Protein, UA Positive (A) Negative   Urobilinogen, UA 1.0 0.2 or 1.0 E.U./dL   Nitrite, UA negative    Leukocytes, UA Negative Negative   Appearance clear    Odor normal   CBC with Differential     Status: Abnormal   Collection Time: 06/10/24  8:55 PM  Result Value Ref Range   WBC 10.6 (H) 4.0 - 10.5 K/uL   RBC 4.75 3.87 - 5.11 MIL/uL   Hemoglobin 12.9 12.0 - 15.0 g/dL   HCT 60.5 63.9 - 53.9 %   MCV 82.9 80.0 - 100.0 fL   MCH 27.2 26.0 - 34.0 pg   MCHC 32.7 30.0 - 36.0 g/dL   RDW 85.5 88.4 - 84.4 %   Platelets 271 150 - 400 K/uL   nRBC 0.0 0.0 - 0.2 %   Neutrophils Relative % 66 %   Neutro Abs 7.1 1.7 - 7.7 K/uL   Lymphocytes Relative 26 %   Lymphs Abs 2.8 0.7 - 4.0 K/uL   Monocytes Relative 7 %   Monocytes Absolute 0.7 0.1 - 1.0 K/uL   Eosinophils Relative 0 %   Eosinophils Absolute 0.0 0.0 - 0.5 K/uL    Basophils Relative 0 %   Basophils Absolute 0.0 0.0 - 0.1 K/uL   Immature Granulocytes 1 %   Abs Immature Granulocytes 0.06 0.00 - 0.07 K/uL    Comment: Performed at [facility], [address] [place], [place] [address]  Troponin I (High Sensitivity)     Status: None   Collection Time: 06/10/24  8:55 PM  Result Value Ref Range   Troponin I (High Sensitivity) 10 <18 ng/L    Comment: (NOTE) Elevated high sensitivity troponin I (hsTnI) values and significant  changes across serial measurements may suggest ACS but many other  chronic and acute conditions are known to elevate hsTnI results.  Refer to the Links section for chest pain algorithms and additional  guidance. Performed at [facility], [address] [place], [place] [address]   Basic metabolic panel     Status: Abnormal   Collection Time: 06/10/24  8:55 PM  Result Value Ref Range   Sodium 137 135 - 145 mmol/L   Potassium 2.8 (L) 3.5 - 5.1 mmol/L   Chloride 104 98 - 111 mmol/L   CO2 21 (L) 22 - 32 mmol/L   Glucose, Bld 117 (H) 70 - 99 mg/dL    Comment: Glucose reference range applies only to samples taken after fasting for at least 8 hours.   BUN 9 8 - 23 mg/dL   Creatinine, Ser 8.25 (H) 0.44 - 1.00 mg/dL   Calcium  9.4 8.9 - 10.3 mg/dL   GFR, Estimated 32 (L) >60 mL/min    Comment: (NOTE) Calculated using the CKD-EPI Creatinine Equation (2021)    Anion gap 12 5 - 15    Comment: Performed at [facility], [address] [place], [place] [address]  Magnesium      Status: None   Collection Time: 06/10/24  8:55 PM  Result Value Ref Range   Magnesium  2.1 1.7 - 2.4 mg/dL    Comment: Performed at [facility], [address] [place], [place] [address]  TSH     Status: None   Collection Time: 06/10/24  9:20 PM  Result Value Ref Range   TSH 2.933 0.350 - 4.500 uIU/mL    Comment: Performed by a 3rd Generation assay with a functional sensitivity of <=0.01 uIU/mL. Performed at  [facility], [address] [place], [place] [address]   T4, free     Status: None   Collection Time: 06/10/24  9:20 PM  Result Value Ref Range   Free T4 0.81 0.61 - 1.12 ng/dL    Comment: (NOTE) Biotin ingestion may interfere with free T4 tests. If the results are inconsistent with the TSH level, previous test results, or the clinical presentation, then consider biotin interference. If needed, order repeat testing after stopping biotin. Performed at [facility], [address] [place], [place] [address]   Troponin I (High Sensitivity)     Status: Abnormal   Collection Time: 06/10/24 11:08 PM  Result Value Ref Range   Troponin I (High Sensitivity) 21 (H) <18 ng/L    Comment: (NOTE) Elevated high sensitivity troponin I (hsTnI) values and significant  changes across serial measurements may suggest ACS but many other  chronic and acute conditions are known to elevate hsTnI results.  Refer to the Links section for chest pain algorithms and additional  guidance. Performed at [facility], [address] [place], [place] [address]   CBG monitoring, ED     Status: None   Collection Time: 06/11/24  2:29 AM  Result Value Ref Range   Glucose-Capillary 83 70 - 99 mg/dL    Comment: Glucose reference range applies only to samples taken after fasting for at least 8 hours.  HIV Antibody (routine testing w rflx)     Status: None   Collection Time: 06/11/24  7:08 AM  Result Value Ref Range   HIV Screen 4th Generation wRfx Non Reactive Non Reactive    Comment: Performed at [facility], [address] [place], [place] [address]  Basic metabolic panel     Status: Abnormal   Collection Time: 06/11/24  7:08 AM  Result Value Ref Range   Sodium 139 135 - 145 mmol/L   Potassium 3.6 3.5 - 5.1 mmol/L   Chloride 109 98 - 111 mmol/L   CO2 26 22 - 32 mmol/L   Glucose, Bld 76 70 - 99 mg/dL    Comment: Glucose reference range applies only to samples taken  after fasting for at least 8 hours.   BUN 8 8 - 23 mg/dL   Creatinine, Ser 8.55 (H) 0.44 - 1.00 mg/dL   Calcium  8.0 (L) 8.9 - 10.3 mg/dL   GFR, Estimated 41 (L) >60 mL/min    Comment: (NOTE) Calculated using the CKD-EPI Creatinine Equation (2021)    Anion gap 4 (L) 5 - 15    Comment: Performed at [facility], [address] [place], [place] [address]  CBG monitoring, ED     Status: None   Collection Time: 06/11/24  7:58 AM  Result Value Ref Range   Glucose-Capillary 82 70 - 99 mg/dL    Comment: Glucose reference range applies only to samples taken after fasting for at least 8 hours.  CBG monitoring, ED     Status: None   Collection Time: 06/11/24 12:02 PM  Result Value Ref Range   Glucose-Capillary 83 70 - 99 mg/dL    Comment: Glucose reference range applies only to samples taken after fasting for at least 8 hours.  Glucose, capillary     Status: Abnormal   Collection Time: 06/11/24  4:21 PM  Result Value Ref Range   Glucose-Capillary 111 (H) 70 - 99 mg/dL    Comment: Glucose reference range applies only to samples taken after fasting for at least 8  hours.  Glucose, capillary     Status: Abnormal   Collection Time: 06/11/24  8:48 PM  Result Value Ref Range   Glucose-Capillary 114 (H) 70 - 99 mg/dL    Comment: Glucose reference range applies only to samples taken after fasting for at least 8 hours.  Glucose, capillary     Status: None   Collection Time: 06/12/24  8:09 AM  Result Value Ref Range   Glucose-Capillary 76 70 - 99 mg/dL    Comment: Glucose reference range applies only to samples taken after fasting for at least 8 hours.  Basic Metabolic Panel Without GFR     Status: Abnormal   Collection Time: 06/14/24  2:12 PM  Result Value Ref Range   Glucose, Bld 78 65 - 99 mg/dL    Comment: .            Fasting reference interval .    BUN 6 (L) 7 - 25 mg/dL    Comment: Verified by repeat analysis. .    Creat 1.55 (H) 0.50 - 1.05 mg/dL    Comment: Verified by  repeat analysis. .    BUN/Creatinine Ratio 4 (L) 6 - 22 (calc)   Sodium 139 135 - 146 mmol/L   Potassium 4.6 3.5 - 5.3 mmol/L   Chloride 105 98 - 110 mmol/L   CO2 26 20 - 32 mmol/L   Calcium  10.3 8.6 - 10.4 mg/dL     EYV7/0:    2/84/7974    9:27 AM 06/14/2024    1:37 PM 06/09/2024   11:30 AM 05/17/2024   10:53 AM 05/05/2024   11:04 AM  Depression screen PHQ 2/9  Decreased Interest 0 0 0 0 0  Down, Depressed, Hopeless 0 0 0 0 0  PHQ - 2 Score 0 0 0 0 0  Altered sleeping 0 0 0 0 0  Tired, decreased energy 0 0 0 0 0  Change in appetite 0 0 0 0 0  Feeling bad or failure about yourself  0 0 0 0 0  Trouble concentrating 0 0 0 0 0  Moving slowly or fidgety/restless 0 0 0 0 0  Suicidal thoughts 0 0 0 0 0  PHQ-9 Score 0 0 0 0 0  Difficult doing work/chores Not difficult at all Not difficult at all Not difficult at all Not difficult at all Not difficult at all    phq 9 is negative  Fall Risk:    07/11/2024    9:27 AM 06/14/2024    1:37 PM 06/09/2024   11:30 AM 05/17/2024   10:52 AM 05/05/2024   10:59 AM  Fall Risk   Falls in the past year? 1 1 1  0 0  Number falls in past yr: 0 0 0 0 0  Injury with Fall? 1 1 1  0 0  Risk for fall due to : Impaired balance/gait Impaired balance/gait Impaired balance/gait No Fall Risks No Fall Risks  Follow up Falls evaluation completed Education provided;Falls evaluation completed;Falls prevention discussed Falls evaluation completed Falls prevention discussed;Education provided;Falls evaluation completed Falls prevention discussed;Falls evaluation completed      Assessment & Plan Obstructive hypertrophic cardiomyopathy with left ventricular outflow tract obstruction Obstructive hypertrophic cardiomyopathy with left ventricular outflow tract obstruction confirmed. Recent cardiac catheterization showed no significant coronary artery disease. Cardiologist presented three treatment options: mavacamten, catheter-based procedure, and open-heart surgery.  Mavacamten is costly, may not be covered by insurance, and requires frequent follow-ups. Catheter-based procedure. Open-heart surgery less favored due to asthma-related complications. She  and her family leaning towards catheter-based procedure. - Discuss treatment options with cardiologist on July 18. - Consider catheter-based procedure to shrink heart muscle. - Evaluate insurance coverage for mavacamten if considering medication option. - Discuss potential risks and benefits of each treatment option with family.  Asthma Asthma may contribute to shortness of breath. Pulmonologist advised continuation of current asthma medication regimen. - Continue current asthma medication regimen as advised by pulmonologist.  Coronary artery calcification without significant stenosis Coronary artery calcification present without significant stenosis. Recent cardiac catheterization confirmed no significant blockages.  Shortness of breath Shortness of breath may be related to both obstructive hypertrophic cardiomyopathy and asthma. Cardiac catheterization performed to evaluate symptom, revealing no significant coronary artery disease. - Evaluate response to treatment for hypertrophic cardiomyopathy and asthma.

## 2024-07-12 ENCOUNTER — Encounter: Payer: Self-pay | Admitting: Pharmacist

## 2024-07-12 ENCOUNTER — Other Ambulatory Visit: Payer: Self-pay | Admitting: Pharmacist

## 2024-07-12 DIAGNOSIS — E1122 Type 2 diabetes mellitus with diabetic chronic kidney disease: Secondary | ICD-10-CM

## 2024-07-12 MED ORDER — FREESTYLE LIBRE 3 PLUS SENSOR MISC: 1 refills | Status: DC

## 2024-07-12 NOTE — Progress Notes (Signed)
 07/12/2024 Name: Brandy Johnston MRN: [id number] DOB: [birth year]  Chief Complaint  Patient presents with   Medication Adherence    Brandy Johnston is a 63 y.o. year old female who presented for a telephone visit.   They were referred to the pharmacist by their Case Management Team  for assistance in managing medication adherence.      Subjective:   Care Team: Primary Care Provider: Sowles, Krichna, MD ; Next Scheduled Visit: 09/18/2024 Cardiologist: Pia Sharper, MD ; Next Scheduled Visit: 07/14/2024 Neurologist: Lane Arthea Locus, MD GI Specialist: UNK DARLYN Costain Pulmonologist: Leontine Mliss DASEN, MD  Medication Access/Adherence  Current Pharmacy:  [facility], [facility], [facility], [place] - [address] [place] [address] Phone: [phone] Fax: [phone]  [facility] [phone] [place], [place] - [address] AT [facility] [address] [facility] [address] [place] [address] Phone: [phone] Fax: [phone]  [facility] - [place]  - [place] - [address] [place] [address] Phone: [phone] Fax: [phone]   Patient reports affordability concerns with their medications: No  Patient reports access/transportation concerns to their pharmacy: No  Patient reports adherence concerns with their medications:  No     Receiving medications pill packaged by [facility]  Shares that she plans to discuss treatment options for her HCM with Cardiologist this Friday, 7/18   Diabetes:   Current medications:  - Ozempic  0.5 mg once weekly on Mondays - Jardiance  25mg  daily - Novolin NPH - previously using as directed ONLY if blood sugar >250   Denies needing in past month   Medications tried in the past: Ozempic  1 mg (unable to tolerate due to appetite)   Using Freestyle Libre 3 continuous glucose monitor. Review data from LibreView:  Date of Download: 07/12/24 % Time  CGM is active: 39% Average Glucose: 113 mg/dL Glucose Management Indicator: -  Glucose Variability: 22.5% (goal <36%) Time in Goal:  - Time in range 70-180: 99% - Time above range: 1% - Time below range: 0%   Shares that time CGM inactive is low due to recently having a sensor error and waiting on replacement  Denies recent symptoms of hypoglycemia   Objective:  Lab Results  Component Value Date   HGBA1C 7.5 (A) 05/17/2024    Lab Results  Component Value Date   CREATININE 1.55 (H) 06/14/2024   BUN 6 (L) 06/14/2024   NA 139 06/14/2024   K 4.6 06/14/2024   CL 105 06/14/2024   CO2 26 06/14/2024    Lab Results  Component Value Date   CHOL 179 01/18/2024   HDL 92 01/18/2024   LDLCALC 60 01/18/2024   TRIG 203 (H) 01/18/2024   CHOLHDL 1.9 01/18/2024    Current Outpatient Medications on File Prior to Visit  Medication Sig Dispense Refill   empagliflozin  (JARDIANCE ) 25 MG TABS tablet Take 1 tablet (25 mg total) by mouth daily before breakfast. 90 tablet 1   Semaglutide ,0.25 or 0.5MG /DOS, (OZEMPIC , 0.25 OR 0.5 MG/DOSE,) 2 MG/3ML SOPN Inject 0.5 mg into the skin once a week. 9 mL 1   albuterol  (VENTOLIN  HFA) 108 (90 Base) MCG/ACT inhaler SMARTSIG:2 Puff(s) By Mouth Every 4-6 Hours PRN     aspirin  (ASPIRIN  81) 81 MG chewable tablet Chew 1 tablet (81 mg total) by mouth daily. 30 tablet 0   Atogepant  (QULIPTA ) 30 MG TABS Take 1 tablet (30 mg total) by mouth  every morning. 90 tablet 1   Baclofen  5 MG TABS Take 1 tablet (5 mg total) by mouth 3 (three) times daily as needed (muscleskeletal pain or spasms). Sedating, do not take with other sedating medications and do not take and drive 60 tablet 0   benralizumab (FASENRA PEN) 30 MG/ML prefilled autoinjector Inject into the skin every 30 (thirty) days.     Blood Glucose Monitoring Suppl (CONTOUR NEXT ONE) KIT USE TO TEST BLOOD SUGAR ONCE D     budesonide -glycopyrrolate -formoterol  (BREZTRI  AEROSPHERE) 160-9-4.8 MCG/ACT AERO inhaler  Inhale 2 puffs into the lungs 2 (two) times daily.     Cholecalciferol (VITAMIN D ) 2000 units CAPS Take 1 capsule (2,000 Units total) by mouth daily. 30 capsule 0   Continuous Glucose Receiver (FREESTYLE LIBRE 3 READER) DEVI as directed.     Continuous Glucose Sensor (FREESTYLE LIBRE 3 SENSOR) MISC 1 Device by Does not apply route every 14 (fourteen) days. 6 each 1   cyclobenzaprine  (FLEXERIL ) 5 MG tablet Take 1 tablet (5 mg total) by mouth at bedtime. 90 tablet 1   diclofenac  Sodium (VOLTAREN ) 1 % GEL Apply 2 g topically 4 (four) times daily. (Patient taking differently: Apply 2 g topically 4 (four) times daily as needed.) 100 g 2   diltiazem  (CARDIZEM  CD) 240 MG 24 hr capsule Take 1 capsule by mouth daily.     EPINEPHrine  0.3 mg/0.3 mL IJ SOAJ injection Inject into the muscle.     ezetimibe  (ZETIA ) 10 MG tablet Take 1 tablet (10 mg total) by mouth daily. 90 tablet 1   fluticasone  (FLONASE) 50 MCG/ACT nasal spray 2 sprays each nostril Daily. DISP#  1 bottles = 1 month supply.     gabapentin  (NEURONTIN ) 300 MG capsule Take 1 capsule (300 mg total) by mouth 3 (three) times daily. 270 capsule 1   glucose blood test strip Use as instructed 100 each 12   insulin  NPH Human (NOVOLIN N) 100 UNIT/ML injection Inject into the skin as needed.     ipratropium-albuterol  (DUONEB) 0.5-2.5 (3) MG/3ML SOLN Take 3 mLs by nebulization every 6 (six) hours as needed. 360 mL 1   levocetirizine (XYZAL ) 5 MG tablet Take 1 tablet (5 mg total) by mouth every evening. 90 tablet 1   magnesium  oxide (MAG-OX) 400 MG tablet TAKE 1 TABLET (400 MG TOTAL) BY MOUTH 2 (TWO) TIMES DAILY. 180 tablet 0   metoprolol  succinate (TOPROL -XL) 25 MG 24 hr tablet TAKE 1/2 TABLET BY MOUTH DAILY 45 tablet 0   Microlet Lancets MISC USE TO CHECK BLOOD SUGAR ONCE D     montelukast  (SINGULAIR ) 10 MG tablet Take 10 mg by mouth daily.     ondansetron  (ZOFRAN -ODT) 4 MG disintegrating tablet Take 1 tablet (4 mg total) by mouth every 8 (eight) hours as  needed for nausea. 30 tablet 0   pantoprazole  (PROTONIX ) 40 MG tablet Take 1 tablet (40 mg total) by mouth 2 (two) times daily. 180 tablet 0   potassium chloride  SA (KLOR-CON  M) 20 MEQ tablet Take 1 tablet (20 mEq total) by mouth daily. 30 tablet 1   rosuvastatin  (CRESTOR ) 40 MG tablet Take 1 tablet (40 mg total) by mouth daily. 90 tablet 1   Sharps Container (BD SHARPS COLLECTOR) MISC Use as directed to dispose of Fasenra pen     sodium chloride  0.9 % nebulizer solution Inhale 3 mLs into the lungs 2 (two) times daily.     Spacer/Aero-Holding [facility] DIAMOND) MISC      triamcinolone  cream (KENALOG )  0.1 % Apply 1 Application topically as needed.     Ubrogepant  (UBRELVY ) 100 MG TABS Take 1 tablet (100 mg total) by mouth daily as needed. 48 tablet 0   [DISCONTINUED] lisinopril  (ZESTRIL ) 10 MG tablet Take 1 tablet (10 mg total) by mouth daily. 90 tablet 0   No current facility-administered medications on file prior to visit.       Assessment/Plan:   Denies need to complete medication review today  Diabetes: - Currently controlled - Reviewed long term cardiovascular and renal outcomes of uncontrolled blood sugar - Reviewed goal A1c, goal fasting, and goal 2 hour post prandial glucose - As Freestyle Libre 3 CGM is being discontinued by manufacturer this year, will send prescription to pharmacy (per protocol) to switch patient to [employer] 3 Plus sensors.              Counsel that [employer] 3 Plus sensors each last 15 days - Recommend to continue to use Freestyle Libre CGM to monitor blood sugar/as feedback on dietary choices Recommend to check glucose with fingerstick check when needed for symptoms and as back up to CGM.  Patient to contact office if needed for readings outside of established parameters or symptoms     Follow Up Plan:   Patient denies further medication questions or concerns today Provide patient with contact information for clinic pharmacist to  contact if needed in future for medication questions/concerns    Sharyle Sia, PharmD, [facility] [phone]

## 2024-07-12 NOTE — Patient Instructions (Signed)
 Goals Addressed             This Visit's Progress    Pharmacy Goals       The goal A1c is less than 7%. This is the best way to reduce the risk of the long term complications of diabetes, including heart disease, kidney disease, eye disease, strokes, and nerve damage. An A1c of less than 7% corresponds with fasting sugars less than 130 and 2 hour after meal sugars less than 180.   Please remember that you will need to apply a new Freestyle Libre 3 Plus sensor every 15 days  Sharyle Sia, PharmD, [facility] [phone]

## 2024-07-14 DIAGNOSIS — I422 Other hypertrophic cardiomyopathy: Secondary | ICD-10-CM | POA: Diagnosis not present

## 2024-08-03 DIAGNOSIS — K219 Gastro-esophageal reflux disease without esophagitis: Secondary | ICD-10-CM | POA: Diagnosis not present

## 2024-08-03 DIAGNOSIS — E1142 Type 2 diabetes mellitus with diabetic polyneuropathy: Secondary | ICD-10-CM | POA: Diagnosis not present

## 2024-08-03 DIAGNOSIS — N183 Chronic kidney disease, stage 3 unspecified: Secondary | ICD-10-CM | POA: Diagnosis not present

## 2024-08-03 DIAGNOSIS — I421 Obstructive hypertrophic cardiomyopathy: Secondary | ICD-10-CM | POA: Diagnosis not present

## 2024-08-03 DIAGNOSIS — I471 Supraventricular tachycardia, unspecified: Secondary | ICD-10-CM | POA: Diagnosis not present

## 2024-08-03 DIAGNOSIS — I10 Essential (primary) hypertension: Secondary | ICD-10-CM | POA: Diagnosis not present

## 2024-08-03 DIAGNOSIS — Z8679 Personal history of other diseases of the circulatory system: Secondary | ICD-10-CM | POA: Diagnosis not present

## 2024-08-03 DIAGNOSIS — N189 Chronic kidney disease, unspecified: Secondary | ICD-10-CM | POA: Diagnosis not present

## 2024-08-03 DIAGNOSIS — I422 Other hypertrophic cardiomyopathy: Secondary | ICD-10-CM | POA: Diagnosis not present

## 2024-08-03 DIAGNOSIS — E785 Hyperlipidemia, unspecified: Secondary | ICD-10-CM | POA: Diagnosis not present

## 2024-08-03 DIAGNOSIS — I4891 Unspecified atrial fibrillation: Secondary | ICD-10-CM | POA: Diagnosis not present

## 2024-08-03 DIAGNOSIS — E440 Moderate protein-calorie malnutrition: Secondary | ICD-10-CM | POA: Diagnosis not present

## 2024-08-03 DIAGNOSIS — Z950 Presence of cardiac pacemaker: Secondary | ICD-10-CM | POA: Diagnosis not present

## 2024-08-03 DIAGNOSIS — J45909 Unspecified asthma, uncomplicated: Secondary | ICD-10-CM | POA: Diagnosis not present

## 2024-08-03 DIAGNOSIS — I519 Heart disease, unspecified: Secondary | ICD-10-CM | POA: Diagnosis not present

## 2024-08-03 DIAGNOSIS — I129 Hypertensive chronic kidney disease with stage 1 through stage 4 chronic kidney disease, or unspecified chronic kidney disease: Secondary | ICD-10-CM | POA: Diagnosis not present

## 2024-08-05 NOTE — Discharge Summary (Signed)
 ------------------------------------------------------------------------------- Attestation signed by Pia Sharper, MD at 08/05/24 1438 I saw and evaluated the patient, participating in the key portions of the service on the day of discharge. I reviewed the resident's note and agree with the discharge plans and disposition. I personally spent less than 40 minutes in discharge planning services.  -------------------------------------------------------------------------------      Cardiology Discharge Summary  Identifying Information: Brandy Johnston 11-13-1961 [id number]   Admit Date: 08/03/2024 Discharge Date: 08/05/2024  Discharge Service: Cardiology [facility]) Discharge Attending Physician: Sharper Pia, MD  Discharge Diagnoses:  Principal Diagnosis: Principal Diagnosis: Hypertrophic Obstructive Cardiomyopathy   Secondary Diagnoses: Supraventricular Tachycardia Hypertension Diabetes Mellitus Type 2 Asthma CKD GERD  Hospital Course: Ms. Hallisey is a 63 y.o. female with past medication history of HCM with obstruction, HTN, SVT, CKD and DM. She presented on 08/03/24 for a left heart catheterization with alcohol septal ablation in the setting of worsening dyspnea despite medical management of HCM. She was admitted to CICU for post-procedural monitoring with RIJ PM in place to monitor for heart block. Postoperatively, she arrived in supraventricular tachycardia and received Diltiazem  30 mg PO once and eventually converted back to NSR. The temporary transvenous pacing wire was removed on post-op day #1. Home Diltiazem  and home Metoprolol  was initiated post op day #1. An echocardiogram was completed on 08/04/24 on post op day #1. She has remained in NSR without evidence of heart block and is stable for discharge today.    Hypertrophic Obstructive Cardiomyopathy  HTN  Dyslipidemia  Patient follows with Dr. Pia as outpatient for HCM. Initial LVOT gradient on TTE was 34 mmHg, IVS 1.4cm.  She underwent LHC with HCM study revealing provoked gradient of . Given persistent symptoms with ongoing dyspnea despite medical therapy, patient opted for alcohol septal ablation. Home regimen includes: Diltiazem  240mg  daily, Toprol -XL 12.5mg  daily. Patient underwent successful alcohol septal ablation on 08/03/24 into the first septal coming off from a large diagnonal, LVOT prior to septal ablation: 100 mmHg with PVC; after septal ablation: negligible. RIJ TVP placed for 24 hours to monitor for heart block, removed on 8/8. Troponin peaked @ 38,000. Patient was restarted on home diltiazem  240mg  daily and Toprol -XL 25mg  daily. She was continued on her aspirin , statin and zetia  during admission. Post-operative TTE performed 08/04/24, preliminary report with LVEF >70%, RVEF normal, IVS 1.3cm, no significant valvular abnormalities, LVEDD 3.2.    Post-op Atrial fibrillation, resolved  Hx of SVT Patient has a history of SVT with a recent ER visit on 06/12/24 for SVT which broke with adenosine , was not taking diltiazem  and also hypokalemic. Previous Ziopatch (01/27/24) with predominantly NSR, two brief episodes of SVT with fastest and longest lasting 4 beats at a rate of 140bpm. Repeat  Ziopatch (06/16/24) predominately SR, 4 VT runs occurred with the fastest interval lasting 8 beats (max rate of 203 bpm), longest lasting 8 beats, 5 SVT runs with fastest interval lasting 6 beats (max rate of 125bpm), longest lasting 7 beats (avg rate of 100bpm). Following procedure today, patient was in atrial fibrillation with RVR. On arrival to CICU, she received diltiazem  30mg  x1. She spontaneously converted to NSR and remained in NSR throughout remainder of admission. Discharging on diltiazem  and Toprol -XL as above.   Chronic problems: Asthma Follows with PCP for management. Medication regimen includes fasenra injections every 8 weeks, breo ellipta  and albuterol  PRN. Continued Breo Ellipta  & Incruse Ellipta throughout  admission.   CKD  Baseline Cr ~ 1.3-1.6, Cr on admission 1.5, Cr down to 1.26 on  day of discharge.  GERD  Follows with GI as outpatient. Continued home pantoprazole  40mg  BID throughout admission.   DM2  Previously on insulin  while on steroids though has been off now for some time. Home regimen includes Ozempic  and Jardiance  25mg  daily. HgbA1c 6.3% on 08/04/24. Continued home Jardiance  25mg  daily throughout admission.   Neuropathy Continued home gabapentin  throughout admission.    Outpatient Provider Follow Up Issues: Plan for hospital follow up with Dr. Pia.   Procedures: LHC with alcohol septal ablation  PR PERCUTANEOUS TRANSCATHETER SEPTAL REDUCTION THER [06416] (Alcohol Septal Ablation)  Discharge Day Services: BP 123/58   Pulse 83   Temp 36.7 C (98.1 F) (Oral)   Resp 23   Ht 162.6 cm (5' 4)   Wt 67.3 kg (148 lb 4.8 oz)   SpO2 95%   BMI 25.46 kg/m  Pt seen on the day of discharge and determined appropriate for discharge.  Condition at Discharge: good  Discharge Medications:   Your Medication List     CHANGE how you take these medications    gabapentin  300 MG capsule Commonly known as: NEURONTIN  Take 1 capsule to 2 capsules (300-600 mg total) by mouth two (2) times a day as directed. Ok to take 2 capsules (600mg ) in the morning and 1 capsule (300mg ) at night (could also swap these as gabapentin  can cause drowsiness) What changed:  how much to take when to take this   metoPROLOL  succinate 25 MG 24 hr tablet Commonly known as: Toprol -XL Take 1 tablet (25 mg total) by mouth daily. Start taking on: August 06, 2024 What changed: how much to take       CONTINUE taking these medications    ACCU-CHEK GUIDE TEST STRIPS Strp Generic drug: blood sugar diagnostic Use to check blood sugar as directed with insulin  3 times a day and for symptoms of high or low blood sugar.   ACCU-CHEK SOFTCLIX LANCETS lancets Generic drug: lancets Use to check blood sugar as  directed with insulin  3 times a day and for symptoms of high or low blood sugar.   albuterol  90 mcg/actuation inhaler Commonly known as: PROVENTIL  HFA;VENTOLIN  HFA Inhale 2 puffs by mouth every four (4) hours as needed for wheezing or shortness of breath.   aspirin  81 MG chewable tablet Chew 1 tablet (81 mg total).   BREZTRI  AEROSPHERE 160-9-4.8 mcg/actuation inhaler Generic drug: budesonide -glycopyr-formoterol  INHALE TWO (2) PUFFS BY MOUTH TWICE DAILY   cholecalciferol (vitamin D3 25 mcg (1,000 units)) 1,000 unit (25 mcg) tablet Take 2 tablets (50 mcg total) by mouth daily.   CONTOUR METER kit Generic drug: blood-glucose meter CONTOUR BLOOD GLUCOSE SYSTEM W/DEVICE KIT   ACCU-CHEK GUIDE GLUCOSE METER Misc Generic drug: blood-glucose meter Use as instructed   cyclobenzaprine  5 MG tablet Commonly known as: FLEXERIL  Take 1 tablet (5 mg total) by mouth Three (3) times a day.   dilTIAZem  240 MG 24 hr capsule Commonly known as: CARDIZEM  CD Take 1 capsule (240 mg total) by mouth daily.   empagliflozin  25 mg tablet Commonly known as: JARDIANCE  Take 1 tablet (25 mg total) by mouth.   empty container Misc Use as directed to dispose of Fasenra pen   EPINEPHrine  0.3 mg/0.3 mL injection Commonly known as: EPIPEN  Inject the contents of one pen into the muscle once for 1 dose as directed.   ezetimibe  10 mg tablet Commonly known as: ZETIA  Take 1 tablet (10 mg total) by mouth in the morning.   FASENRA PEN 30 mg/mL Atin Generic drug:  benralizumab Inject 30 mg under the skin every 8 weeks for 6 doses.   fluticasone  propionate 50 mcg/actuation nasal spray Commonly known as: FLONASE INSTILL 2 SPRAYS IN EACH NOSTRIL ONCE DAILY *NEW PRESCRIPTION REQUEST*   ipratropium-albuterol  0.5-2.5 mg/3 mL nebulizer Commonly known as: DUO-NEB Inhale 3 mL by nebulization two (2) times a day as needed (wheezing, sob).   IRON ORAL Take 1 tablet by mouth in the morning. Unknown strength .    levocetirizine 5 MG tablet Commonly known as: XYZAL  Take 1 tablet (5 mg total) by mouth every evening.   magnesium  oxide 400 mg (241.3 mg elemental) tablet Commonly known as: MAG-OX Take 1 tablet (400 mg total) by mouth two (2) times a day.   meclizine  25 mg tablet Commonly known as: ANTIVERT  Chew 1 tablet (25 mg total) Three (3) times a day as needed for dizziness.   ondansetron  4 MG disintegrating tablet Commonly known as: ZOFRAN -ODT Dissolve 1 tablet (4 mg total) in the mouth every eight (8) hours as needed for nausea.   OZEMPIC  0.25 mg or 0.5 mg(2 mg/1.5 mL) Pnij injection Generic drug: semaglutide  Inject 0.5 mg under the skin every seven (7) days.   pantoprazole  40 MG tablet Commonly known as: Protonix  Take 1 tablet (40 mg total) by mouth Two (2) times a day (30 minutes before a meal).   potassium chloride  20 MEQ ER tablet Take 1 tablet (20 mEq total) by mouth daily. TAKE 1 TABLET (20 MEQ TOTAL) BY MOUTH DAILY.   QULIPTA  30 mg Tab Generic drug: atogepant  Take 30 mg by mouth daily.   rosuvastatin  40 MG tablet Commonly known as: CRESTOR  Take 1 tablet (40 mg total) by mouth in the morning.   sodium chloride  0.9 % nebulizer solution Inhale 3 mL (one nebule) by nebulization in the morning and 3 mL (one nebule) in the evening. Take albuterol  before, with twice daily airway clearance.   triamcinolone  0.1 % cream Commonly known as: KENALOG  Apply topically two (2) times a day as needed.   TRUEPLUS INSULIN  0.5 mL 30 gauge x 5/16 (8 mm) Syrg Generic drug: insulin  syringe-needle U-100 Use with insulin  up to 4 times daily as needed.        Recent Labs: Microbiology Results (last day)     ** No results found for the last 24 hours. **      Lab Results  Component Value Date   WBC 10.3 08/05/2024   HGB 10.6 (L) 08/05/2024   HCT 32.1 (L) 08/05/2024   PLT 180 08/05/2024   Lab Results  Component Value Date   NA 141 08/05/2024   K 4.1 08/05/2024   CL 107  08/05/2024   CO2 27.0 08/05/2024   BUN 14 08/05/2024   CREATININE 1.26 (H) 08/05/2024   CALCIUM  9.1 08/05/2024   MG 1.9 08/05/2024   Lab Results  Component Value Date   ALKPHOS 118 (H) 08/03/2024   BILITOT 0.6 08/03/2024   PROT 8.4 (H) 08/03/2024   ALBUMIN 4.1 08/03/2024   ALT 13 08/03/2024   AST 68 (H) 08/03/2024   Lab Results  Component Value Date   PT 14.3 (H) 08/03/2024   INR 1.25 08/03/2024   APTT 312.0 (HH) 08/03/2024   Radiology: Cath/Vascular Procedure Result Date: 08/05/2024 Images from the original result were not included. Cardiac Catheterization Laboratory [facility]  [place], [place] Tel: [phone] Fax: [phone]  FINAL CARDIAC CATHETERIZATION REPORT   Date of Procedure: 08/03/24 ___________________________________________________________________________ _ Findings: HCM with obstructive physiology with  significant gradient (detailed below) Successful alcohol sepal ablation with improvement of AO-LV resting gradient to less than 5 mmHg.  Recommendations: Admit to the CICU for observation and further management. Serial troponin q6h until peak. Repeat echocardiogram in the am.  Complications: None ___________________________________________________________________________ _ Referring Physician: Ozell Laud, MD Primary Cardiologist: Ozell Laud, MD  Performing Attending: Ozell Laud, MD Structural Fellow: Ozell Stands Interventional Fellow: Arthea He  Procedures performed:        Temporary pacemaker placement, Ethanol septal ablation, Left Heart Catheterization and Coronary angiography  Access Site: Right Femoral Artery; Right Femoral Vein; Right Femoral Artery Arterial Closure: RFA -  Perclose; LFA - Perclose  Contrast Volume: 100 mL ___________________________________________________________________________ _ History She presents today for ETOH septal ablation.  Procedure  Left Heart Catheterization (Right Radial) Under Lidocaine  2% local  anesthesia, a 1F Terumo Glidesheath was placed in the right radial artery using modified Seldinger technique. 3 mg of verapamil were given via the sheath intra-arterially. A 0.035 Rosen wire (260cm) was used to guide the advancement of the coronary catheter for engagement. Aortic pressures were obtained from a 1F angled Pigtail catheter in the ascending aorta.  Ethanol Septal Ablation A 1F Pigtail diagonstic catheter was advanced across the aortic valve into the left ventricle. Simutaneous Ao / LV pressures were obtained. Post-PVC and post-nitroglycerin pressures were obtained. Next a 1Fr EBU3.5 guide catheter was used to engage the LMCA. A 0.014 Runthrough guidewire was advanced into the first septal perforator and placed in the distal branch of the S1. A 2.0x68mm over the wire (OTW) balloon was then advanced into S1. The balloon was inflated to 12 atm. Cine angiography was then performed with contrast injection via the guide catheter and through the OTW balloon, at separate times, to confirm the balloon was occlusive. Definity echocardiography contrast was administered via OTW balloon while simultaneous imaging the septum on transthoracic echocardiogram. Definity enhanced images confirmed septal branch supplied the basal septum. Decision was made to proceed with ethanol administration. Again balloon occlusion was confirmed angiography. 1.0mL of 99% Ethanol was administered via OTW over 7 minutes. Final angiography showed occlusion of first sub branch of the first septal perforator.  Temporary Pacemaker Placement A 7 French Cordis catheter was placed in the right internal jugular vein.  A 5 French bipolar pacing catheter was advanced into the Cordis. The catheter was advanced into the right atrium and then the right ventricle to a depth of 68 cm at which point pacing was achieved. The balloon was deflated. and the catheter was retracted 1 cm. Loss of capture was noted at <1 mA. The rate was set at 90 bpm and output  of 2 mA.   Following the procedure, the sheath was removed, and a Perclose  closure device was used to obtain hemostasis to RFA and LFA. The patient tolerated the procedure well without any complications.   ___________________________________________________________________________ _ FINDINGS  Hemodynamics and Left Heart Catheterization  Pre-Ablation Hemodynamics Aortic pressure:  94/51 mm Hg (mean 59 mmHg) LV pressure  =  (EDP ) 8 mm Hg Ao-Left Ventricle simultaneous measurements Resting: mean gradient 5 mmHg with peak-to-peak gradient mmHg Post-PVC: peak-to-peak gradient ~100 mmHg Post-NTG: mean gradient mmHg with peak-to-peak gradient ~100 mmHg  Post-Ablation Hemodynamics Aortic pressure: 99/60 mm Hg (mean 74 mm Hg) LV pressure  = (EDP ) 10 mm Hg Ao-Left Ventricle simultaneous measurements Resting: mean gradient negligible Post-PVC: peak-to-peak gradient  negligible Post-NTG: mean gradient  mmHg with peak-to-peak gradient  negligible  Coronary Angiography  Dominance: Right  Left Main: The left main coronary artery (LMCA) is a large-caliber vessel that originates from the left coronary sinus. It bifurcates into the left anterior descending (LAD) and left circumflex (LCx) arteries. There is no angiographic evidence of significant disease in the LMCA.  LAD: The LAD is a large-caliber vessel that gives off several small to moderate diagonal (D) branches before it wraps around the apex. There is no angiographic evidence of significant disease in the LAD. The large proximal septal which had TIMI-0 flow after ablation.  Left Circumflex: The LCx is a large-caliber vessel that gives off 2 obtuse marginal (OM) branches and then continues as a small vessel in the AV groove. OM1 is a small-caliber branching vessel. OM2 is a large-caliber vessel. There is no angiographic evidence of significant disease in the LCx.   Pictures Pre                                                               Post  I personally spent   minutes , continuously monitoring the patient face to face during the administration of moderate sedation. Independent observer RN was present for the duration of the procedure to assist in patient monitoring. Pre and post sedation activities have been reviewed. ___________________________________________________________________________ PHARES Ozell Stands Structural Heart Disease Fellow [facility] ___________________________________________________________________________ _ I was present for the entire duration of the procedure, as detailed above Ozell Laud, MD      Echocardiogram Follow Up/Limited Echo Result Date: 08/04/2024 Patient Info Name:     Brandy Johnston Age:     62 years DOB:     Nov 07, 1961 Gender:     Female MRN:     [id number] Accession #:     [id number] UN Account #:     [id number] Ht:     163 cm Wt:     69 kg BSA:     1.79 m2 BP:     130 /     70 mmHg HR:     93 bpm Heart Rhythm:     Sinus Rhythm Exam Date:     08/04/2024 11:17 AM Admit Date:     08/03/2024 Exam Type:     ECHOCARDIOGRAM FOLLOW UP/LIMITED ECHO Technical Quality:     Fair Staff Sonographer:     Carl Coma Referring Physician:     Unknown Per Patient Referring Reading Fellow:     Trenda Fairly MD Ordering Physician:     Ozell Stands MD Study Info Indications      - s/p ASA Procedure(s)   Limited 2D, color flow and Doppler transthoracic echocardiogram is performed. Summary   1. The left ventricle is normal in size with moderately increased septal wall thickness.   2. The left ventricular systolic function is hyperdynamic, LVEF is visually estimated at >70%.   3. The right ventricle is normal in size, with normal systolic function.   4. There is a trivial, circumferential pericardial effusion. Left Ventricle   The left ventricle is normal in size with moderately increased septal wall thickness. The left ventricular systolic function is hyperdynamic, LVEF is visually estimated at >70%. The  left ventricular diastolic function is indeterminate. Right Ventricle   The right ventricle is normal in size, with normal systolic function.  Aortic Valve   The aortic valve is probably trileaflet with mildly thickened leaflets with normal excursion. Mitral Valve   The mitral valve leaflets are normal with normal leaflet mobility. There is trivial mitral valve regurgitation. Tricuspid Valve   The tricuspid valve is not well visualized. There is trivial tricuspid regurgitation. There is no tricuspid valve stenosis. Pulmonic Valve   Pulmonary valve is not well visualized. There is no significant pulmonic regurgitation. There is no evidence of a significant transvalvular gradient. Aorta   The aorta is normal in size in the visualized segments. Inferior Vena Cava   The IVC is not well visualized precluding the ability to accurate assess right atrial pressure. Pericardium/Pleural   There is a trivial, circumferential pericardial effusion. Pericardial fat pad present. Other Findings   Rhythm: Sinus Rhythm. Ventricles ---------------------------------------------------------------------- Name                                 Value        Normal ---------------------------------------------------------------------- LV Dimensions 2D/MM ----------------------------------------------------------------------  IVS Diastolic Thickness (2D)                                1.3 cm       0.6-0.9 LVID Diastole (2D)                  3.2 cm       3.8-5.2  LVPW Diastolic Thickness (2D)                                0.9 cm       0.6-0.9 LVID Systole (2D)                   2.4 cm       2.2-3.5 LV Mass Index (2D Cubed)           60 g/m2         43-95  Relative Wall Thickness (2D)                                  0.56        <=0.42 Atria ---------------------------------------------------------------------- Name                                 Value        Normal ---------------------------------------------------------------------- LA  Dimensions ---------------------------------------------------------------------- LA Dimension (2D)                   3.1 cm       2.7-3.8 Mitral Valve ---------------------------------------------------------------------- Name                                 Value        Normal ---------------------------------------------------------------------- MV Diastolic Function ---------------------------------------------------------------------- MV E Peak Velocity                 57 cm/s               MV A Peak Velocity                146 cm/s  MV E/A                                 0.4               MV Annular TDI ---------------------------------------------------------------------- MV Septal e' Velocity             9.1 cm/s         >=8.0 MV E/e' (Septal)                       6.3               MV Lateral e' Velocity           11.2 cm/s        >=10.0 MV E/e' (Lateral)                      5.1               MV e' Average                    10.2 cm/s               MV E/e' (Average)                      5.7 Tricuspid Valve ---------------------------------------------------------------------- Name                                 Value        Normal ---------------------------------------------------------------------- TV Regurgitation Doppler ---------------------------------------------------------------------- TR Peak Velocity                   2.0 m/s Aorta ---------------------------------------------------------------------- Name                                 Value        Normal ---------------------------------------------------------------------- Ascending Aorta ---------------------------------------------------------------------- Ao Root Diameter (2D)               3.0 cm               Ao Root Diam Index (2D)          1.7 cm/m2 Report Signatures Resident Trenda Fairly  MD on 08/04/2024 01:57 PM  ECG 12 Lead Result Date: 08/04/2024 NORMAL SINUS RHYTHM LOW VOLTAGE QRS NONSPECIFIC T WAVE ABNORMALITY  ABNORMAL ECG WHEN COMPARED WITH ECG OF 03-Aug-2024 15:53, NO SIGNIFICANT CHANGE WAS FOUND Confirmed by Von Shawl ([id number]) on 08/04/2024 10:21:17 AM  ECG 12 Lead Result Date: 08/04/2024 SINUS TACHYCARDIA LOW VOLTAGE QRS T WAVE ABNORMALITY, CONSIDER INFERIOR ISCHEMIA ABNORMAL ECG WHEN COMPARED WITH ECG OF 03-Aug-2024 10:41, SINUS RHYTHM HAS REPLACED ATRIAL FIBRILLATION NONSPECIFIC T WAVE ABNORMALITY HAS REPLACED INVERTED T WAVES IN LATERAL LEADS  Echocardiogram Follow Up/Limited Echo With Contrast Result Date: 08/03/2024 Patient Info Name:     Brandy Johnston Age:     62 years DOB:     [birth year] Gender:     Female MRN:     [id number] Accession #:     [id number] UN Account #:     [id number] Exam Date:     08/03/2024 8:49 AM Admit Date:     08/03/2024 Exam Type:     ECHOCARDIOGRAM FOLLOW UP/LIMITED ECHO  WITH CONTRAST Technical Quality:     Fair Staff Sonographer:     Wilbert Fake Referring Physician:     Unknown Per Patient Referring Ordering Physician:     Ozell Stands MD Study Info Indications      - ASA ; Definity/Optison Procedure(s)   Limited 2D, color flow and Doppler transthoracic echocardiogram is performed with contrast. Ultrasound Enhancing Agent/Agitated Saline ------------------------------ UEA/Ag. Saline:     Definity Amount:     --- ml Summary   1. Procedural guidance for alcohol septal ablation.   2. The left ventricle is normal in size with moderately increased wall thickness.   3. The left ventricular systolic function is hyperdynamic, LVEF is visually estimated at 70%.   4. The right ventricle is normal in size, with normal systolic function.   5. Injection of Definity contrast confirmed the opacification of the basal segment of the septum.  During ethanol administration, and immediately after, midesophageal and transgastric imaging was again performed to exclude injection into areas other than the septal hypertrophy. Ethanol is incrementally injected until the septal region of  interest is adequately opacified on echocardiography.   6. Post ablation:  Peak LVOT velocity: 1.7 m/s.  Peak gradient: 12 mm Hg. Left Ventricle   The left ventricle is normal in size with moderately increased wall thickness. The left ventricular systolic function is hyperdynamic, LVEF is visually estimated at 70%. Right Ventricle   The right ventricle is normal in size, with normal systolic function. Pericardium/Pleural   There is no pericardial effusion. Ventricles ---------------------------------------------------------------------- Name                                 Value        Normal ---------------------------------------------------------------------- LV Dimensions 2D/MM ----------------------------------------------------------------------  IVS Diastolic Thickness (2D)                                1.5 cm       0.6-0.9 Report Signatures Finalized by Odell Debby Grew  MD on 08/03/2024 05:34 PM  ECG 12 Lead Result Date: 08/03/2024 ATRIAL FIBRILLATION WITH RAPID VENTRICULAR RESPONSE ST & T WAVE ABNORMALITY, CONSIDER INFEROLATERAL ISCHEMIA ABNORMAL ECG WHEN COMPARED WITH ECG OF 03-Aug-2024 07:38, SIGNIFICANT CHANGES HAVE OCCURRED  ECG 12 Lead Result Date: 08/03/2024 NORMAL SINUS RHYTHM LOW VOLTAGE QRS BORDERLINE ECG WHEN COMPARED WITH ECG OF 26-Jun-2024 07:20, NO SIGNIFICANT CHANGE WAS FOUND   Discharge Instructions: Activity Instructions     Activity as tolerated     OK to shower (no bath)         Other Instructions     Call MD for:  difficulty breathing, headache or visual disturbances     Call MD for:  redness, tenderness, or signs of infection (pain, swelling, redness, odor or green/yellow discharge around incision site)     At groin puncture site.   Discharge instructions     Please follow the instructions below regarding your cardiac catheterization with femoral artery access.  * You should feel your puncture site before going home and then daily. Look in the mirror at  the puncture site each day  * Be sure to call your doctor if you have: -- A fever above 100 degrees F -- Unusual chest discomfort or back pain -- Increased swelling of groin or leg -- Pain not helped by acetaminophen  (aka Tylenol ) -- An incision that has  pus or foul-smelling drainage, is red, swollen, painful, hard, and feels hot to touch -- Bleeding that soaks through the bandage -- Severe pain, numbness, or tingling in groin, leg, or foot  * If there is any oozing of blood or the appearance of increased swelling at the puncture site: -- Lie flat and have someone apply firm pressure for 20 minutes. This pressure will stop the bleeding by allowing a small clot to form. -- If there appears to be bleeding that has not stopped after you applied pressure for 20 minutes, call 911. Continue holding pressure until the ambulance arrives. ____________________________________________________________________  Activity Instructions  * You may drive a car the day after your procedure. If your procedure was earlier today, someone else must drive you home.  * Avoid strenuous activities, do not lift over 10 pounds for 5 days  * For 5 days: avoid long periods of standing or walking, avoid taking lots of stairs, and avoid push-pull activities, such as vacuuming  * May return to work, school, usual activities one day after your procedure as long as your work does not involve hard labor, especially heavy lifting  * May take a shower 24 hours after your procedure  * Remove the dressing 24 hours after your procedure. Wash with soap and water  and pat dry. Then keep clean and dry. Apply dry dressing, if needed.  * No sitting in a tub bath or a pool for 5 days  If you have questions about nursing or medical care,  call the Cardiac Cath Lab at [phone] Monday - Friday 8am - 5pm; at night or on weekends, you may call [facility] at [phone] and ask for the Cardiology doctor on call   Remove  dressing in 24 hours     From groin puncture site.      Follow Up instructions and Outpatient Referrals    Call MD for:  difficulty breathing, headache or visual disturbances     Call MD for:  redness, tenderness, or signs of infection (pain, swelling,  redness, odor or green/yellow discharge around incision site)     Discharge instructions      Appointments which have been scheduled for you    Aug 18, 2024 1:20 PM (Arrive by 1:05 PM) RETURN HCM with Ozell Laud, MD [facility]ARDIOLOG[facility] [place] [facility]) [address] [facility] [address] [place] [address] [phone]         Length of Discharge: I spent less than 30 mins in the discharge of this patient.

## 2024-08-06 DIAGNOSIS — I422 Other hypertrophic cardiomyopathy: Secondary | ICD-10-CM | POA: Diagnosis not present

## 2024-08-07 DIAGNOSIS — I422 Other hypertrophic cardiomyopathy: Secondary | ICD-10-CM | POA: Diagnosis not present

## 2024-08-10 ENCOUNTER — Other Ambulatory Visit: Payer: Self-pay | Admitting: Family Medicine

## 2024-08-10 NOTE — Telephone Encounter (Unsigned)
 Copied from CRM #[id number]. Topic: Clinical - Medication Refill >> Aug 10, 2024  2:44 PM Jayma L wrote: Medication: potassium chloride  SA (KLOR-CON  M) 20 MEQ tablet  Has the patient contacted their pharmacy? No (Agent: If no, request that the patient contact the pharmacy for the refill. If patient does not wish to contact the pharmacy document the reason why and proceed with request.) (Agent: If yes, when and what did the pharmacy advise?)  This is the patient's preferred pharmacy:  [id number] [place], [place] - [address] AT [facility] [address] [address] [place] [address] Phone: [phone] Fax: [phone] Hours: Not open 24 hours    Is this the correct pharmacy for this prescription? Yes If no, delete pharmacy and type the correct one.   Has the prescription been filled recently? No  Is the patient out of the medication? Yes  Has the patient been seen for an appointment in the last year OR does the patient have an upcoming appointment? Yes  Can we respond through MyChart? Yes  Agent: Please be advised that Rx refills may take up to 3 business days. We ask that you follow-up with your pharmacy.

## 2024-08-11 ENCOUNTER — Encounter: Payer: Self-pay | Admitting: Family Medicine

## 2024-08-11 ENCOUNTER — Ambulatory Visit (INDEPENDENT_AMBULATORY_CARE_PROVIDER_SITE_OTHER): Admitting: Family Medicine

## 2024-08-11 VITALS — BP 114/74 | HR 95 | Resp 16 | Ht 64.0 in | Wt 149.9 lb

## 2024-08-11 DIAGNOSIS — E876 Hypokalemia: Secondary | ICD-10-CM | POA: Diagnosis not present

## 2024-08-11 DIAGNOSIS — I422 Other hypertrophic cardiomyopathy: Secondary | ICD-10-CM

## 2024-08-11 DIAGNOSIS — I471 Supraventricular tachycardia, unspecified: Secondary | ICD-10-CM

## 2024-08-11 MED ORDER — POTASSIUM CHLORIDE CRYS ER 20 MEQ PO TBCR: 20.0000 meq | EXTENDED_RELEASE_TABLET | Freq: Every day | ORAL | 1 refills | Status: DC

## 2024-08-11 NOTE — Progress Notes (Signed)
 Name: Brandy Johnston   MRN: [id number]    DOB: [birth year]   Date:08/11/2024       Progress Note  Subjective  Chief Complaint  Chief Complaint  Patient presents with   Hospitalization Follow-up    Pt states can feel some knots in areas where the surgery was done. Pt was advised to keep an eye on knots cold be concerning. Alcohol Septal Ablation   Discussed the use of AI scribe software for clinical note transcription with the patient, who gave verbal consent to proceed.  History of Present Illness Brandy Johnston is a 63 year old female with hypertrophic cardiomyopathy and supraventricular tachycardia who presents for follow-up after alcohol septal ablation.  She underwent an alcohol septal ablation on August 03, 2024, at [facility] to address her supraventricular tachycardia and hypertrophic cardiomyopathy with obstruction due to worsening dyspnea despite medical management. The procedure involved catheterization from both groin sides, and she was temporarily placed on a pacemaker during and after the procedure to monitor her heart rhythm. She was discharged on August 05, 2024, after her heart rate normalized.  Post-procedure, she experiences soreness in her legs, particularly in the groin area where the catheters were inserted.  She continues to experience some shortness of breath, which she attributes to her asthma and cardiomyopathy. No significant changes in her asthma symptoms.  Her medication regimen includes metoprolol , increased to a full 25 mg tablet daily, and Cardizem  at 240 mg. She is also resuming her previous gabapentin  dosing gradually. During her hospital stay, she received diltiazem  for supraventricular tachycardia and potassium supplementation due to low levels, which have since normalized.  Medication reconciliation was done during the visit     Patient Active Problem List   Diagnosis Date Noted   History of angiotensin converting enzyme inhibitor (ACE-I) allergy 06/11/2024    Prolonged QT interval 06/11/2024   SVT (supraventricular tachycardia) (HCC) 06/11/2024   Esophageal dysphagia 05/17/2024   Migraine without aura, intractable, with status migrainosus 05/17/2024   Hypertrophic cardiomyopathy (HCC) 05/17/2024   Dyslipidemia associated with type 2 diabetes mellitus (HCC) 05/17/2024   Small vessel disease (HCC) 01/26/2024   Severe persistent asthma 01/18/2024   Muscle spasms of both lower extremities 01/18/2024   Chronic left shoulder pain 11/09/2022   Hyperlipidemia 01/14/2021   Incomplete tear of left rotator cuff 07/03/2020   Angioedema due to angiotensin converting enzyme inhibitor (ACE-I) 01/29/2020   Chronic bilateral back pain 07/26/2017   Coronary artery calcification 05/17/2017   Heart palpitations 05/15/2017   History of acute gastritis    Atherosclerosis of abdominal aorta (HCC) 08/18/2016   Type 2 diabetes mellitus with stage 3 chronic kidney disease and hypertension (HCC) 12/16/2015   Diabetic neuropathy associated with type 2 diabetes mellitus (HCC) 09/18/2015   Insomnia 09/18/2015   Benign essential HTN 06/18/2015   Stage 3b chronic kidney disease (HCC) 06/18/2015   Diabetes mellitus with renal manifestation (HCC) 06/18/2015   Obesity (BMI 30.0-34.9) 06/18/2015   Degenerative arthritis of hip 06/18/2015   Sickle cell trait (HCC) 06/18/2015   Gastroesophageal reflux disease 05/25/2008    Past Surgical History:  Procedure Laterality Date   BREAST BIOPSY Right 12/28/2019   stereo [facility] stromal fibrosis   CHOLECYSTECTOMY     COLONOSCOPY     ESOPHAGOGASTRODUODENOSCOPY (EGD) WITH PROPOFOL  N/A 10/05/2016   Procedure: ESOPHAGOGASTRODUODENOSCOPY (EGD) WITH PROPOFOL ;  Surgeon: Rogelia Copping, MD;  Location: [facility];  Service: Endoscopy;  Laterality: N/A;   FRACTURE SURGERY Left    cast and pins  SHOULDER ARTHROSCOPY WITH ROTATOR CUFF REPAIR AND SUBACROMIAL DECOMPRESSION Left 12/07/2018   Procedure: left shoulder manipulation under  anesthesia, left shoulder arthroscopic lysis of adhesions;  Surgeon: Leora Lynwood SAUNDERS, MD;  Location: [facility];  Service: Orthopedics;  Laterality: Left;   SHOULDER CLOSED REDUCTION Left 12/07/2018   Procedure: CLOSED MANIPULATION SHOULDER;  Surgeon: Leora Lynwood SAUNDERS, MD;  Location: [facility];  Service: Orthopedics;  Laterality: Left;   shoulder surgery  Left 06/10/2018   Dr. Seward   TUBAL LIGATION      Family History  Problem Relation Age of Onset   Migraines Mother    Diabetes Mother    Cancer Mother        lung   Arthritis Brother    Breast cancer Maternal Aunt    Cancer Maternal Uncle        Lung and Colon   Cirrhosis Brother    Breast cancer Cousin     Social History   Tobacco Use   Smoking status: Never   Smokeless tobacco: Never  Substance Use Topics   Alcohol use: Yes    Alcohol/week: 0.0 standard drinks of alcohol    Comment: rare     Current Outpatient Medications:    albuterol  (VENTOLIN  HFA) 108 (90 Base) MCG/ACT inhaler, SMARTSIG:2 Puff(s) By Mouth Every 4-6 Hours PRN, Disp: , Rfl:    aspirin  (ASPIRIN  81) 81 MG chewable tablet, Chew 1 tablet (81 mg total) by mouth daily., Disp: 30 tablet, Rfl: 0   Atogepant  (QULIPTA ) 30 MG TABS, Take 1 tablet (30 mg total) by mouth every morning., Disp: 90 tablet, Rfl: 1   Baclofen  5 MG TABS, Take 1 tablet (5 mg total) by mouth 3 (three) times daily as needed (muscleskeletal pain or spasms). Sedating, do not take with other sedating medications and do not take and drive, Disp: 60 tablet, Rfl: 0   benralizumab (FASENRA PEN) 30 MG/ML prefilled autoinjector, Inject into the skin every 30 (thirty) days., Disp: , Rfl:    Blood Glucose Monitoring Suppl (CONTOUR NEXT ONE) KIT, USE TO TEST BLOOD SUGAR ONCE D, Disp: , Rfl:    budesonide -glycopyrrolate -formoterol  (BREZTRI  AEROSPHERE) 160-9-4.8 MCG/ACT AERO inhaler, Inhale 2 puffs into the lungs 2 (two) times daily., Disp: , Rfl:    Cholecalciferol (VITAMIN D ) 2000 units CAPS, Take 1 capsule  (2,000 Units total) by mouth daily., Disp: 30 capsule, Rfl: 0   Continuous Glucose Receiver (FREESTYLE LIBRE 3 READER) DEVI, as directed., Disp: , Rfl:    Continuous Glucose Sensor (FREESTYLE LIBRE 3 PLUS SENSOR) MISC, Place 1 sensor on the skin every 15 days. Use to check glucose continuously, Disp: 6 each, Rfl: 1   cyclobenzaprine  (FLEXERIL ) 5 MG tablet, Take 1 tablet (5 mg total) by mouth at bedtime., Disp: 90 tablet, Rfl: 1   diclofenac  Sodium (VOLTAREN ) 1 % GEL, Apply 2 g topically 4 (four) times daily. (Patient taking differently: Apply 2 g topically 4 (four) times daily as needed.), Disp: 100 g, Rfl: 2   diltiazem  (CARDIZEM  CD) 240 MG 24 hr capsule, Take 1 capsule by mouth daily., Disp: , Rfl:    empagliflozin  (JARDIANCE ) 25 MG TABS tablet, Take 1 tablet (25 mg total) by mouth daily before breakfast., Disp: 90 tablet, Rfl: 1   EPINEPHrine  0.3 mg/0.3 mL IJ SOAJ injection, Inject into the muscle., Disp: , Rfl:    ezetimibe  (ZETIA ) 10 MG tablet, Take 1 tablet (10 mg total) by mouth daily., Disp: 90 tablet, Rfl: 1   fluticasone  (FLONASE) 50 MCG/ACT nasal spray, 2  sprays each nostril Daily. DISP#  1 bottles = 1 month supply., Disp: , Rfl:    gabapentin  (NEURONTIN ) 300 MG capsule, Take 1 capsule (300 mg total) by mouth 3 (three) times daily., Disp: 270 capsule, Rfl: 1   glucose blood test strip, Use as instructed, Disp: 100 each, Rfl: 12   insulin  NPH Human (NOVOLIN N) 100 UNIT/ML injection, Inject into the skin as needed., Disp: , Rfl:    ipratropium-albuterol  (DUONEB) 0.5-2.5 (3) MG/3ML SOLN, Take 3 mLs by nebulization every 6 (six) hours as needed., Disp: 360 mL, Rfl: 1   levocetirizine (XYZAL ) 5 MG tablet, Take 1 tablet (5 mg total) by mouth every evening., Disp: 90 tablet, Rfl: 1   magnesium  oxide (MAG-OX) 400 MG tablet, TAKE 1 TABLET (400 MG TOTAL) BY MOUTH 2 (TWO) TIMES DAILY., Disp: 180 tablet, Rfl: 0   metoprolol  succinate (TOPROL -XL) 25 MG 24 hr tablet, Take 25 mg by mouth daily., Disp: ,  Rfl:    Microlet Lancets MISC, USE TO CHECK BLOOD SUGAR ONCE D, Disp: , Rfl:    montelukast  (SINGULAIR ) 10 MG tablet, Take 10 mg by mouth daily., Disp: , Rfl:    ondansetron  (ZOFRAN -ODT) 4 MG disintegrating tablet, Take 1 tablet (4 mg total) by mouth every 8 (eight) hours as needed for nausea., Disp: 30 tablet, Rfl: 0   pantoprazole  (PROTONIX ) 40 MG tablet, Take 1 tablet (40 mg total) by mouth 2 (two) times daily., Disp: 180 tablet, Rfl: 0   potassium chloride  SA (KLOR-CON  M) 20 MEQ tablet, Take 1 tablet (20 mEq total) by mouth daily., Disp: 30 tablet, Rfl: 1   rosuvastatin  (CRESTOR ) 40 MG tablet, Take 1 tablet (40 mg total) by mouth daily., Disp: 90 tablet, Rfl: 1   Semaglutide ,0.25 or 0.5MG /DOS, (OZEMPIC , 0.25 OR 0.5 MG/DOSE,) 2 MG/3ML SOPN, Inject 0.5 mg into the skin once a week., Disp: 9 mL, Rfl: 1   Sharps Container (BD SHARPS COLLECTOR) MISC, Use as directed to dispose of Fasenra pen, Disp: , Rfl:    sodium chloride  0.9 % nebulizer solution, Inhale 3 mLs into the lungs 2 (two) times daily., Disp: , Rfl:    Spacer/Aero-Holding Chambers (OPTICHAMBER DIAMOND) MISC, , Disp: , Rfl:    triamcinolone  cream (KENALOG ) 0.1 %, Apply 1 Application topically as needed., Disp: , Rfl:    Ubrogepant  (UBRELVY ) 100 MG TABS, Take 1 tablet (100 mg total) by mouth daily as needed., Disp: 48 tablet, Rfl: 0   metoprolol  succinate (TOPROL -XL) 25 MG 24 hr tablet, TAKE 1/2 TABLET BY MOUTH DAILY (Patient not taking: Reported on 08/11/2024), Disp: 45 tablet, Rfl: 0  Allergies  Allergen Reactions   Ace Inhibitors Swelling    Angioedema    Lisinopril  Swelling    Face and neck swelling   Quetiapine     confusion    I personally reviewed active problem list, medication list, allergies with the patient/caregiver today.   ROS  Ten systems reviewed and is negative except as mentioned in HPI    Objective Physical Exam  CONSTITUTIONAL: Patient appears well-developed and well-nourished. No distress. HEENT: Head  atraumatic, normocephalic, neck supple. CARDIOVASCULAR: Normal rate, regular rhythm and normal heart sounds. No murmur heard. No BLE edema. PULMONARY: Effort normal and breath sounds normal. Lungs clear to auscultation. No respiratory distress. ABDOMINAL: There is no tenderness or distention. MUSCULOSKELETAL: Normal gait. Without gross motor or sensory deficit. PSYCHIATRIC: Patient has a normal mood and affect. Behavior is normal. Judgment and thought content normal. Lymphonodi : Right groin nodule likley hematoma  1 cm, left groin hematoma 2 cm, no oozing, skin normal.  Vitals:   08/11/24 0956  BP: 114/74  Pulse: 95  Resp: 16  SpO2: 99%  Weight: 149 lb 14.4 oz (68 kg)  Height: 5' 4 (1.626 m)    Body mass index is 25.73 kg/m.  Recent Results (from the past 2160 hours)  POCT glycosylated hemoglobin (Hb A1C)     Status: Abnormal   Collection Time: 05/17/24 11:08 AM  Result Value Ref Range   Hemoglobin A1C 7.5 (A) 4.0 - 5.6 %   HbA1c POC (<> result, manual entry)     HbA1c, POC (prediabetic range)     HbA1c, POC (controlled diabetic range)    CBC with Differential/Platelet     Status: Abnormal   Collection Time: 05/17/24 11:49 AM  Result Value Ref Range   WBC 11.6 (H) 3.8 - 10.8 Thousand/uL   RBC 4.56 3.80 - 5.10 Million/uL   Hemoglobin 12.6 11.7 - 15.5 g/dL   HCT 60.4 64.9 - 54.9 %   MCV 86.6 80.0 - 100.0 fL   MCH 27.6 27.0 - 33.0 pg   MCHC 31.9 (L) 32.0 - 36.0 g/dL    Comment: For adults, a slight decrease in the calculated MCHC value (in the range of 30 to 32 g/dL) is most likely not clinically significant; however, it should be interpreted with caution in correlation with other red cell parameters and the patient's clinical condition.    RDW 14.8 11.0 - 15.0 %   Platelets 266 140 - 400 Thousand/uL   MPV 10.6 7.5 - 12.5 fL   Neutro Abs 8,306 (H) 1,500 - 7,800 cells/uL   Absolute Lymphocytes 2,587 850 - 3,900 cells/uL   Absolute Monocytes 673 200 - 950 cells/uL    Eosinophils Absolute 0 (L) 15 - 500 cells/uL   Basophils Absolute 35 0 - 200 cells/uL   Neutrophils Relative % 71.6 %   Total Lymphocyte 22.3 %   Monocytes Relative 5.8 %   Eosinophils Relative 0.0 %   Basophils Relative 0.3 %  Comprehensive metabolic panel with GFR     Status: Abnormal   Collection Time: 05/17/24 11:49 AM  Result Value Ref Range   Glucose, Bld 97 65 - 99 mg/dL    Comment: .            Fasting reference interval .    BUN 9 7 - 25 mg/dL   Creat 8.49 (H) 9.49 - 1.05 mg/dL   eGFR 39 (L) > OR = 60 mL/min/1.47m2   BUN/Creatinine Ratio 6 6 - 22 (calc)   Sodium 143 135 - 146 mmol/L   Potassium 3.8 3.5 - 5.3 mmol/L   Chloride 104 98 - 110 mmol/L   CO2 28 20 - 32 mmol/L   Calcium  9.7 8.6 - 10.4 mg/dL   Total Protein 7.2 6.1 - 8.1 g/dL   Albumin 4.2 3.6 - 5.1 g/dL   Globulin 3.0 1.9 - 3.7 g/dL (calc)   AG Ratio 1.4 1.0 - 2.5 (calc)   Total Bilirubin 0.7 0.2 - 1.2 mg/dL   Alkaline phosphatase (APISO) 90 37 - 153 U/L   AST 17 10 - 35 U/L   ALT 10 6 - 29 U/L  Urine Culture     Status: Abnormal   Collection Time: 06/09/24 12:16 PM   Specimen: Urine  Result Value Ref Range   MICRO NUMBER: [id number]    SPECIMEN QUALITY: Adequate    Sample Source URINE    STATUS: FINAL  ISOLATE 1: Staphylococcus epidermidis (A)     Comment: 50,000-100,000 CFU/mL of Staphylococcus epidermidis May represent colonizers from external and internal genitalia. No further testing (including susceptibility) will be performed.  POCT urinalysis dipstick     Status: Abnormal   Collection Time: 06/09/24 12:19 PM  Result Value Ref Range   Color, UA yellow    Clarity, UA clear    Glucose, UA Positive (A) Negative   Bilirubin, UA negative    Ketones, UA trace    Spec Grav, UA 1.010 1.010 - 1.025   Blood, UA negative    pH, UA 7.5 5.0 - 8.0   Protein, UA Positive (A) Negative   Urobilinogen, UA 1.0 0.2 or 1.0 E.U./dL   Nitrite, UA negative    Leukocytes, UA Negative Negative   Appearance  clear    Odor normal   CBC with Differential     Status: Abnormal   Collection Time: 06/10/24  8:55 PM  Result Value Ref Range   WBC 10.6 (H) 4.0 - 10.5 K/uL   RBC 4.75 3.87 - 5.11 MIL/uL   Hemoglobin 12.9 12.0 - 15.0 g/dL   HCT 60.5 63.9 - 53.9 %   MCV 82.9 80.0 - 100.0 fL   MCH 27.2 26.0 - 34.0 pg   MCHC 32.7 30.0 - 36.0 g/dL   RDW 85.5 88.4 - 84.4 %   Platelets 271 150 - 400 K/uL   nRBC 0.0 0.0 - 0.2 %   Neutrophils Relative % 66 %   Neutro Abs 7.1 1.7 - 7.7 K/uL   Lymphocytes Relative 26 %   Lymphs Abs 2.8 0.7 - 4.0 K/uL   Monocytes Relative 7 %   Monocytes Absolute 0.7 0.1 - 1.0 K/uL   Eosinophils Relative 0 %   Eosinophils Absolute 0.0 0.0 - 0.5 K/uL   Basophils Relative 0 %   Basophils Absolute 0.0 0.0 - 0.1 K/uL   Immature Granulocytes 1 %   Abs Immature Granulocytes 0.06 0.00 - 0.07 K/uL    Comment: Performed at [facility], [address] [place], [place] [address]  Troponin I (High Sensitivity)     Status: None   Collection Time: 06/10/24  8:55 PM  Result Value Ref Range   Troponin I (High Sensitivity) 10 <18 ng/L    Comment: (NOTE) Elevated high sensitivity troponin I (hsTnI) values and significant  changes across serial measurements may suggest ACS but many other  chronic and acute conditions are known to elevate hsTnI results.  Refer to the Links section for chest pain algorithms and additional  guidance. Performed at [facility], [address] [place], [place] [address]   Basic metabolic panel     Status: Abnormal   Collection Time: 06/10/24  8:55 PM  Result Value Ref Range   Sodium 137 135 - 145 mmol/L   Potassium 2.8 (L) 3.5 - 5.1 mmol/L   Chloride 104 98 - 111 mmol/L   CO2 21 (L) 22 - 32 mmol/L   Glucose, Bld 117 (H) 70 - 99 mg/dL    Comment: Glucose reference range applies only to samples taken after fasting for at least 8 hours.   BUN 9 8 - 23 mg/dL   Creatinine, Ser 8.25 (H) 0.44 - 1.00 mg/dL   Calcium  9.4 8.9 - 10.3  mg/dL   GFR, Estimated 32 (L) >60 mL/min    Comment: (NOTE) Calculated using the CKD-EPI Creatinine Equation (2021)    Anion gap 12 5 - 15    Comment:  Performed at [facility], [address] [place], [place] [address]  Magnesium      Status: None   Collection Time: 06/10/24  8:55 PM  Result Value Ref Range   Magnesium  2.1 1.7 - 2.4 mg/dL    Comment: Performed at [facility], [address] [place], [place] [address]  TSH     Status: None   Collection Time: 06/10/24  9:20 PM  Result Value Ref Range   TSH 2.933 0.350 - 4.500 uIU/mL    Comment: Performed by a 3rd Generation assay with a functional sensitivity of <=0.01 uIU/mL. Performed at [facility], [address] [place], [place] [address]   T4, free     Status: None   Collection Time: 06/10/24  9:20 PM  Result Value Ref Range   Free T4 0.81 0.61 - 1.12 ng/dL    Comment: (NOTE) Biotin ingestion may interfere with free T4 tests. If the results are inconsistent with the TSH level, previous test results, or the clinical presentation, then consider biotin interference. If needed, order repeat testing after stopping biotin. Performed at [facility], [address] [place], [place] [address]   Troponin I (High Sensitivity)     Status: Abnormal   Collection Time: 06/10/24 11:08 PM  Result Value Ref Range   Troponin I (High Sensitivity) 21 (H) <18 ng/L    Comment: (NOTE) Elevated high sensitivity troponin I (hsTnI) values and significant  changes across serial measurements may suggest ACS but many other  chronic and acute conditions are known to elevate hsTnI results.  Refer to the Links section for chest pain algorithms and additional  guidance. Performed at [facility], [address] [place], [place] [address]   CBG monitoring, ED     Status: None   Collection Time: 06/11/24  2:29 AM  Result Value Ref Range   Glucose-Capillary 83 70 - 99 mg/dL    Comment:  Glucose reference range applies only to samples taken after fasting for at least 8 hours.  HIV Antibody (routine testing w rflx)     Status: None   Collection Time: 06/11/24  7:08 AM  Result Value Ref Range   HIV Screen 4th Generation wRfx Non Reactive Non Reactive    Comment: Performed at [facility], [address], [place], [place] [address]  Basic metabolic panel     Status: Abnormal   Collection Time: 06/11/24  7:08 AM  Result Value Ref Range   Sodium 139 135 - 145 mmol/L   Potassium 3.6 3.5 - 5.1 mmol/L   Chloride 109 98 - 111 mmol/L   CO2 26 22 - 32 mmol/L   Glucose, Bld 76 70 - 99 mg/dL    Comment: Glucose reference range applies only to samples taken after fasting for at least 8 hours.   BUN 8 8 - 23 mg/dL   Creatinine, Ser 8.55 (H) 0.44 - 1.00 mg/dL   Calcium  8.0 (L) 8.9 - 10.3 mg/dL   GFR, Estimated 41 (L) >60 mL/min    Comment: (NOTE) Calculated using the CKD-EPI Creatinine Equation (2021)    Anion gap 4 (L) 5 - 15    Comment: Performed at [facility], [address] [place], [place] [address]  CBG monitoring, ED     Status: None   Collection Time: 06/11/24  7:58 AM  Result Value Ref Range   Glucose-Capillary 82 70 - 99 mg/dL    Comment: Glucose reference range applies only to samples  taken after fasting for at least 8 hours.  CBG monitoring, ED     Status: None   Collection Time: 06/11/24 12:02 PM  Result Value Ref Range   Glucose-Capillary 83 70 - 99 mg/dL    Comment: Glucose reference range applies only to samples taken after fasting for at least 8 hours.  Glucose, capillary     Status: Abnormal   Collection Time: 06/11/24  4:21 PM  Result Value Ref Range   Glucose-Capillary 111 (H) 70 - 99 mg/dL    Comment: Glucose reference range applies only to samples taken after fasting for at least 8 hours.  Glucose, capillary     Status: Abnormal   Collection Time: 06/11/24  8:48 PM  Result Value Ref Range   Glucose-Capillary 114 (H) 70 - 99 mg/dL     Comment: Glucose reference range applies only to samples taken after fasting for at least 8 hours.  Glucose, capillary     Status: None   Collection Time: 06/12/24  8:09 AM  Result Value Ref Range   Glucose-Capillary 76 70 - 99 mg/dL    Comment: Glucose reference range applies only to samples taken after fasting for at least 8 hours.  Basic Metabolic Panel Without GFR     Status: Abnormal   Collection Time: 06/14/24  2:12 PM  Result Value Ref Range   Glucose, Bld 78 65 - 99 mg/dL    Comment: .            Fasting reference interval .    BUN 6 (L) 7 - 25 mg/dL    Comment: Verified by repeat analysis. .    Creat 1.55 (H) 0.50 - 1.05 mg/dL    Comment: Verified by repeat analysis. .    BUN/Creatinine Ratio 4 (L) 6 - 22 (calc)   Sodium 139 135 - 146 mmol/L   Potassium 4.6 3.5 - 5.3 mmol/L   Chloride 105 98 - 110 mmol/L   CO2 26 20 - 32 mmol/L   Calcium  10.3 8.6 - 10.4 mg/dL  Lab report - scanned     Status: None   Collection Time: 06/15/24 12:00 AM  Result Value Ref Range   A1c 6.7%     Comment: ABSTRACTED BY HIM      PHQ2/9:    08/11/2024    9:46 AM 07/11/2024    9:27 AM 06/14/2024    1:37 PM 06/09/2024   11:30 AM 05/17/2024   10:53 AM  Depression screen PHQ 2/9  Decreased Interest 0 0 0 0 0  Down, Depressed, Hopeless 0 0 0 0 0  PHQ - 2 Score 0 0 0 0 0  Altered sleeping 0 0 0 0 0  Tired, decreased energy 0 0 0 0 0  Change in appetite 0 0 0 0 0  Feeling bad or failure about yourself  0 0 0 0 0  Trouble concentrating 0 0 0 0 0  Moving slowly or fidgety/restless 0 0 0 0 0  Suicidal thoughts 0 0 0 0 0  PHQ-9 Score 0 0 0 0 0  Difficult doing work/chores Not difficult at all Not difficult at all Not difficult at all Not difficult at all Not difficult at all    phq 9 is negative  Fall Risk:    08/11/2024    9:46 AM 07/11/2024    9:27 AM 06/14/2024    1:37 PM 06/09/2024   11:30 AM 05/17/2024   10:52 AM  Fall Risk   Falls in the  past year? 1 1 1 1  0  Number falls in  past yr: 0 0 0 0 0  Injury with Fall? 1 1 1 1  0  Risk for fall due to : Impaired balance/gait Impaired balance/gait Impaired balance/gait Impaired balance/gait No Fall Risks  Follow up Falls evaluation completed Falls evaluation completed Education provided;Falls evaluation completed;Falls prevention discussed Falls evaluation completed Falls prevention discussed;Education provided;Falls evaluation completed    Assessment & Plan Status post alcohol septal ablation for obstructive hypertrophic cardiomyopathy Status post alcohol septal ablation on August 03, 2024, for obstructive hypertrophic cardiomyopathy. Procedure was well-tolerated with no need for a permanent pacemaker. Post-procedure echocardiogram showed hyperdynamic left ventricle function with an ejection fraction of 70% and moderately increased septal wall thickness. Reports some improvement in symptoms but still experiences shortness of breath, likely due to underlying asthma and cardiomyopathy. - Continue metoprolol  25 mg daily. - Continue Cardizem  240 mg daily. - Monitor heart rate using home devices. - Follow up with cardiology on August 18, 2024.  Supraventricular tachycardia, status post ablation Supraventricular tachycardia addressed during the alcohol septal ablation. Post-procedure, experienced SVT and was treated with diltiazem , returning to normal sinus rhythm. Heart rate remains slightly elevated at 95 bpm. - Monitor heart rate using home devices. - Follow up with cardiology on August 18, 2024.  Right and left groin hematomas after catheterization Right and left groin hematomas noted post-catheterization. Right groin hematoma approximately 1 cm, left groin hematoma approximately 2 cm. Both are slightly tender but show no signs of infection or lymphadenopathy. Likely small hematomas due to bleeding under the skin. - Gently massage the area to aid absorption. - Apply warm compresses to the area.  Hypertension Blood pressure  is improved compared to previous readings, currently at 114/74 mmHg. Managed with metoprolol  and Cardizem . - Continue current antihypertensive regimen.  Asthma Asthma symptoms remain well-managed. Reports no desire for additional treatments such as injections.  Anemia Mild anemia with hemoglobin at 10.6 g/dL.  Hypokalemia, resolved Hypokalemia noted during hospitalization, treated with potassium supplementation. Potassium levels have normalized. - Continue potassium supplementation as previously prescribed. - Send potassium prescription to [facility].

## 2024-08-17 ENCOUNTER — Other Ambulatory Visit: Payer: Self-pay | Admitting: Family Medicine

## 2024-08-17 DIAGNOSIS — J45909 Unspecified asthma, uncomplicated: Secondary | ICD-10-CM

## 2024-08-17 DIAGNOSIS — J42 Unspecified chronic bronchitis: Secondary | ICD-10-CM

## 2024-08-17 DIAGNOSIS — R053 Chronic cough: Secondary | ICD-10-CM

## 2024-08-17 DIAGNOSIS — R062 Wheezing: Secondary | ICD-10-CM

## 2024-08-25 DIAGNOSIS — E119 Type 2 diabetes mellitus without complications: Secondary | ICD-10-CM | POA: Diagnosis not present

## 2024-08-25 DIAGNOSIS — M19012 Primary osteoarthritis, left shoulder: Secondary | ICD-10-CM | POA: Diagnosis not present

## 2024-08-30 ENCOUNTER — Telehealth: Payer: Self-pay | Admitting: Family Medicine

## 2024-08-30 ENCOUNTER — Other Ambulatory Visit: Payer: Self-pay | Admitting: Family Medicine

## 2024-08-30 MED ORDER — NYSTATIN 100000 UNIT/ML MT SUSP: 5.0000 mL | Freq: Four times a day (QID) | OROMUCOSAL | 0 refills | Status: DC

## 2024-08-30 NOTE — Telephone Encounter (Unsigned)
 Copied from CRM [id number]. Topic: Clinical - Medication Refill >> Aug 30, 2024  9:57 AM Tiffini S wrote: Medication: nystatin  oral   Has the patient contacted their pharmacy? Yes (Agent: If no, request that the patient contact the pharmacy for the refill. If patient does not wish to contact the pharmacy document the reason why and proceed with request.) (Agent: If yes, when and what did the pharmacy advise?)  This is the patient's preferred pharmacy:    [id number] [place],  - [address] AT [facility] OF [address] [place] [address] Phone: [phone] Fax: [phone]   Is this the correct pharmacy for this prescription? Yes If no, delete pharmacy and type the correct one.   Has the prescription been filled recently? Yes  Is the patient out of the medication? Yes  Has the patient been seen for an appointment in the last year OR does the patient have an upcoming appointment? Yes  Can we respond through MyChart? No, please call [phone]  Agent: Please be advised that Rx refills may take up to 3 business days. We ask that you follow-up with your pharmacy.

## 2024-08-30 NOTE — Telephone Encounter (Signed)
 Not on current med list: last fill 03/22/24

## 2024-09-15 DIAGNOSIS — N183 Chronic kidney disease, stage 3 unspecified: Secondary | ICD-10-CM | POA: Diagnosis not present

## 2024-09-15 DIAGNOSIS — I422 Other hypertrophic cardiomyopathy: Secondary | ICD-10-CM | POA: Diagnosis not present

## 2024-09-15 DIAGNOSIS — E1122 Type 2 diabetes mellitus with diabetic chronic kidney disease: Secondary | ICD-10-CM | POA: Diagnosis not present

## 2024-09-15 DIAGNOSIS — Z794 Long term (current) use of insulin: Secondary | ICD-10-CM | POA: Diagnosis not present

## 2024-09-15 DIAGNOSIS — R809 Proteinuria, unspecified: Secondary | ICD-10-CM | POA: Diagnosis not present

## 2024-09-15 DIAGNOSIS — E1129 Type 2 diabetes mellitus with other diabetic kidney complication: Secondary | ICD-10-CM | POA: Diagnosis not present

## 2024-09-15 DIAGNOSIS — I471 Supraventricular tachycardia, unspecified: Secondary | ICD-10-CM | POA: Diagnosis not present

## 2024-09-17 NOTE — Progress Notes (Signed)
 DIVISION OF CARDIOLOGY [employer] [facility] [employer] , Brandy Johnston       Date of Service: 09/15/2024   PCP: Referring Provider:  Glenard Dorette Orem, MD [address] [place] [address] Phone: [phone] Fax: [address] Dorette Orem, MD [address] [place],  [place] [address] Phone: [phone] Fax:    ______________________________________________________________________________________________   ASSESSMENT AND PLAN:    1. Hypertrophic Obstructive Cardiomyopathy - with LVOT gradient of 100 mm Hg on LHC  - s/p cardiac cath with HCM study, provoked gradient 100 mmHg  - today we discussed risk/benefit of management options including surgery vs EtOH ablation vs Mavacamten. Likely not a great surgical candidate with pulmonary disease.  - she is s/p successful ETOH septal ablation with marked improvement of her LVOT gradient and climically - symptomatic relief of her HOCM.   - continue toprol  25 mg and diltiazem  240. - continue inhalers for her underlying asthma. - she feels remarkably better.    2.  SCD from HOCM -  - no syncope or FHx of SCD - cardiac monitor with no VT - most recent cMRI with possible subtle subendocardial gadolinium enhancement in region of moderate hypertrophy   3.  SVT  - ED visit 06/12/24 for SVT which broke with adenosine , was not taking diltiazem  (not on her package meds), also hypokalemic. - cardiac monitor placed, few episodes of VT and SVT  - suspect that any SVT would make her quite symptomatic given her HCM - continues on Diltiazem  240mg  daily - no chest pain, had one episode of lightheadedness likely orthostasis, encouraged fluid intake   4.  HTN - well controlled on current regimen   5.  DM - per PCP  I personally spent 25 minutes face to face and non face to face in the care of this patient, which includes pre, intra, and post visit time on the date of  service.  ______________________________________________________________________________________________   SUBJECTIVE:   HISTORY OF PRESENT ILLNESS:  Dear Dr. Toni Johnston,   I had the pleasure of seeing Brandy Johnston in our [facility] ([facility]) - for evaluation of hypertrophic cardiomyopathy referred to us  by Dr. Toni Johnston.  She is a 63 y.o. year-old patient with a history of recently diagnosed asthma, DM2, HL, sickle cell trait, CKD stage IIIa - who presented to [facility] on 11/2023 and was diagnosed with hypertrophic cardiomyopathy on echocardiogarm.  At the time she presented with dyspnea and wheezing - thought to be related to both her newly diagnosed asthma (which is at least moderate in severity) and her hypertrophic cardiomyopahty (LVOT gradient of 34 mm Hg by echo). Previously on Diltiazem  180mg  daily for rate control which was increased to 240mg  daily due to elevated HR. She has had worsening exertional dyspnea, persistent despite optimization of asthma. Now s/p LHC on 06/26/24 which was significant for provoked gradient of .  - we proceeded with ETOH septal ablation and she did quite well.  Her symptoms of DOE have largely resolved, and she is about 80% better from her shortness of breath and dyspnea.  She is very happy with her results.  Continue toprol  25 mg once a day and inhalers.  Brandy Johnston states compliance with her medications and denies any untoward side effects from it.    Cardiac Cath (05/2024): - No significant CAD - HCM study: - at rest:  no gradient - PVC: 60 mm Hg - NTG: 25 mm Hg - NTG + PVC: 100 mm Hg.  Cardiac MRI (01/2024)  Impressions: - The left ventricle is normal in size with moderate asymmetric basal septal hypertrophy (measuring 1.4 cm) with normal systolic function; LVEF = 62% - The right ventricle is normal in size with normal systolic function; RVEF = 63% - There is systolic anterior motion of the mitral  valve with resultant early closure of the aortic valve (Max Pressure Gradient 14 mmHg). - Limited quality LGE images overall, but there is suggestion of possible very subtle subendocardial late gadolinium enhancement in the basal anteroseptum within the region of moderate hypertrophy.  Cardiac Echocardiogram (11/2023)  Summary   1. The left ventricle is normal in size with mildly increased wall thickness and asymmetric hypertrophy of the ventricular septum, consistent with a hypertrophic cardiomyopathy.   2. The left ventricular systolic function is hyperdynamic, LVEF is visually estimated at >70%.   3. There is systolic anterior motion of the mitral valve and aortic subvalvular turbulence, suggesting a dynamic outflow obstruction.   4. With Valsalva the peak left ventricular outflow gradient is 34 mmHg.   5. The right ventricle is normal in size, with normal systolic function.   6. There is no evidence of an interatrial flow communication or intrapulmonary shunt by agitated saline study.    I have reviewed the cardiology tests personally.    PAST MEDICAL HISTORY Past Medical History:  Diagnosis Date  . Acid reflux   . Arthritis   . Diabetes mellitus    (CMS-HCC)   . Gallbladder problem   . GERD (gastroesophageal reflux disease)   . Headache   . High cholesterol   . Hip pain   . Hypertension   . Hypertension   . Lower back pain   . Peripheral neuropathy   . Shoulder pain   . Sickle cell trait     ALLERGIES Lisinopril    CURRENT MEDICATIONS Current Outpatient Medications  Medication Sig Dispense Refill  . albuterol  HFA 90 mcg/actuation inhaler Inhale 2 puffs by mouth every four (4) hours as needed for wheezing or shortness of breath. 18 g 1  . aspirin  81 MG chewable tablet Chew 1 tablet (81 mg total).    SABRA benralizumab 30 mg/mL AtIn Inject 30 mg under the skin every 8 weeks for 6 doses. 6 mL 0  . blood sugar diagnostic (GLUCOSE BLOOD) Strp Use to check blood sugar as  directed with insulin  3 times a day and for symptoms of high or low blood sugar. 100 strip 0  . blood-glucose meter (CONTOUR METER) kit CONTOUR BLOOD GLUCOSE SYSTEM W/DEVICE KIT    . blood-glucose meter kit Use as instructed 1 each 0  . BREZTRI  AEROSPHERE 160-9-4.8 mcg/actuation inhaler INHALE TWO (2) PUFFS BY MOUTH TWICE DAILY 10.7 g 11  . cholecalciferol, vitamin D3 25 mcg, 1,000 units,, 1,000 unit (25 mcg) tablet Take 2 tablets (50 mcg total) by mouth daily.    . cyclobenzaprine  (FLEXERIL ) 5 MG tablet Take 1 tablet (5 mg total) by mouth Three (3) times a day. 21 tablet 0  . dilTIAZem  (CARDIZEM  CD) 240 MG 24 hr capsule Take 1 capsule (240 mg total) by mouth daily. 100 capsule 3  . empagliflozin  (JARDIANCE ) 25 mg tablet Take 1 tablet (25 mg total) by mouth.    . empty container Misc Use as directed to dispose of Fasenra pen 1 each 2  . EPINEPHrine  (EPIPEN ) 0.3 mg/0.3 mL injection Inject the contents of one pen into the muscle once for 1 dose as directed. 2 each 1  . ezetimibe  (ZETIA )  10 mg tablet Take 1 tablet (10 mg total) by mouth in the morning.    . fluticasone  propionate (FLONASE) 50 mcg/actuation nasal spray INSTILL 2 SPRAYS IN EACH NOSTRIL ONCE DAILY *NEW PRESCRIPTION REQUEST* 48 g 3  . gabapentin  (NEURONTIN ) 300 MG capsule Take 1 capsule to 2 capsules (300-600 mg total) by mouth two (2) times a day as directed. Ok to take 2 capsules (600mg ) in the morning and 1 capsule (300mg ) at night (could also swap these as gabapentin  can cause drowsiness) 120 capsule 0  . insulin  syringe-needle U-100 0.5 mL 30 gauge x 5/16 (8 mm) Syrg Use with insulin  up to 4 times daily as needed. 100 each 0  . ipratropium-albuterol  (DUO-NEB) 0.5-2.5 mg/3 mL nebulizer Inhale 3 mL by nebulization two (2) times a day as needed (wheezing, sob).    . lancets Misc Use to check blood sugar as directed with insulin  3 times a day and for symptoms of high or low blood sugar. 100 each 0  . levocetirizine (XYZAL ) 5 MG tablet  Take 1 tablet (5 mg total) by mouth every evening.    . magnesium  oxide (MAG-OX) 400 mg (241.3 mg elemental magnesium ) tablet Take 1 tablet (400 mg total) by mouth two (2) times a day.    . meclizine  (ANTIVERT ) 25 mg tablet Chew 1 tablet (25 mg total) Three (3) times a day as needed for dizziness.    . metoPROLOL  succinate (TOPROL -XL) 25 MG 24 hr tablet Take 1 tablet (25 mg total) by mouth daily. 90 tablet 0  . ondansetron  (ZOFRAN -ODT) 4 MG disintegrating tablet Dissolve 1 tablet (4 mg total) in the mouth every eight (8) hours as needed for nausea.    . pantoprazole  (PROTONIX ) 40 MG tablet Take 1 tablet (40 mg total) by mouth Two (2) times a day (30 minutes before a meal). 180 tablet 3  . potassium chloride  20 MEQ ER tablet Take 1 tablet (20 mEq total) by mouth daily. TAKE 1 TABLET (20 MEQ TOTAL) BY MOUTH DAILY.    . QULIPTA  30 mg Tab Take 30 mg by mouth daily.    . rosuvastatin  (CRESTOR ) 40 MG tablet Take 1 tablet (40 mg total) by mouth in the morning.    . semaglutide  (OZEMPIC ) 0.25 mg or 0.5 mg(2 mg/1.5 mL) PnIj injection Inject 0.5 mg under the skin every seven (7) days.    . sodium chloride  0.9 % nebulizer solution Inhale 3 mL (one nebule) by nebulization in the morning and 3 mL (one nebule) in the evening. Take albuterol  before, with twice daily airway clearance. 300 mL 1  . triamcinolone  (KENALOG ) 0.1 % cream Apply topically two (2) times a day as needed.    . ferrous sulfate (IRON ORAL) Take 1 tablet by mouth in the morning. Unknown strength . (Patient not taking: Reported on 09/15/2024)     No current facility-administered medications for this visit.    FAMILY HISTORY Negative for early CAD  SOCIAL HISTORY She  reports that she has never smoked. She has never been exposed to tobacco smoke. She has never used smokeless tobacco. She reports that she does not currently use drugs. She reports that she does not drink alcohol.   REVIEW OF SYSTEMS  Review of Systems - 10 systems were  reviewed and negative except as noted in HPI  Constitutional: negative for - chills, fatigue, fever or night sweats ENT ROS: negative for - vertigo or visual changes Hematological and Lymphatic ROS: negative for - blood transfusions, jaundice, night sweats  or swollen lymph nodes Endocrine ROS: negative for - skin changes or temperature intolerance Respiratory ROS: no cough, shortness of breath, or wheezing negative for - hemoptysis, orthopnea, shortness of breath, tachypnea or wheezing Cardiovascular ROS:  As reported in HPI Gastrointestinal ROS: negative for - abdominal pain, hematemesis, melena, nausea/vomiting or swallowing difficulty/pain Genito-Urinary ROS: negative for - dysuria, incontinence or nocturia Musculoskeletal ROS: negative for - gait disturbance, joint pain, muscle pain or muscular weakness Neurological ROS: negative for - behavioral changes, bowel and bladder control changes, headaches, seizures or speech problems  PHYSICAL EXAM   Physical Exam BP 106/60 (BP Site: L Arm, BP Position: Sitting, BP Cuff Size: Medium)   Pulse 67   Wt 66.6 kg (146 lb 12.8 oz)   SpO2 97%   BMI 25.20 kg/m   Wt Readings from Last 3 Encounters:  09/15/24 66.6 kg (146 lb 12.8 oz)  08/05/24 67.3 kg (148 lb 4.8 oz)  07/14/24 70.2 kg (154 lb 12.8 oz)    General:  Alert, no distress.  Eyes:  Intact, sclerae anicteric.  Ears, nose, mouth: Moist mucous membranes.Supple, no carotid bruit.   Respiratory:   CTAB bilaterally with normal WOB.  Cardiovascular:  No carotid bruit, no JVD, RRR 1/6 systolic murmur  Gastrointestinal:   Normal bowel sounds, soft, NTND.  Musculoskeletal: Normal strength  Skin: Warm, well perfused.  Neurologic: No focal deficits.    Most recent labs  Lab Results  Component Value Date   Sodium 141 08/05/2024   Potassium 4.1 08/05/2024   Chloride 107 08/05/2024   CO2 27.0 08/05/2024   BUN 14 08/05/2024   Creatinine 1.26 (H) 08/05/2024   Magnesium  1.9 08/05/2024    Lab Results  Component Value Date   HGB 10.6 (L) 08/05/2024   MCV 79.1 08/05/2024   Platelet 180 08/05/2024   Lab Results  Component Value Date   Cholesterol, Total 132 08/03/2024   Triglycerides 54 08/03/2024   Cholesterol, HDL 62 08/03/2024   Cholesterol, Non-HDL, Calculated 70 08/03/2024   Cholesterol, LDL, Calculated 62 08/03/2024   Hemoglobin A1C 6.3 (H) 08/03/2024   PRO-BNP 102.0 02/16/2024   INR 1.25 08/03/2024    I personally spent 45 minutes face to face and non face to face in the care of this patient, which includes pre, intra, and post visit time on the date of service.  I reviewed the imaging options personally.  We discussed treatment options and discuss the risk and benefits of each option.  Ozell Laud, MD,  [facility], [facility] Interventional Cardiology Associate Professor of Medicine [employer]  at [facility]

## 2024-09-18 ENCOUNTER — Ambulatory Visit: Admitting: Family Medicine

## 2024-09-18 ENCOUNTER — Encounter: Payer: Self-pay | Admitting: Family Medicine

## 2024-09-18 ENCOUNTER — Other Ambulatory Visit: Payer: Self-pay | Admitting: Family Medicine

## 2024-09-18 VITALS — BP 104/64 | HR 73 | Resp 16 | Ht 64.0 in | Wt 146.6 lb

## 2024-09-18 DIAGNOSIS — E876 Hypokalemia: Secondary | ICD-10-CM

## 2024-09-18 DIAGNOSIS — J4550 Severe persistent asthma, uncomplicated: Secondary | ICD-10-CM

## 2024-09-18 DIAGNOSIS — N1831 Chronic kidney disease, stage 3a: Secondary | ICD-10-CM | POA: Diagnosis not present

## 2024-09-18 DIAGNOSIS — K219 Gastro-esophageal reflux disease without esophagitis: Secondary | ICD-10-CM

## 2024-09-18 DIAGNOSIS — I422 Other hypertrophic cardiomyopathy: Secondary | ICD-10-CM

## 2024-09-18 DIAGNOSIS — I700 Atherosclerosis of aorta: Secondary | ICD-10-CM | POA: Diagnosis not present

## 2024-09-18 DIAGNOSIS — I739 Peripheral vascular disease, unspecified: Secondary | ICD-10-CM

## 2024-09-18 DIAGNOSIS — D500 Iron deficiency anemia secondary to blood loss (chronic): Secondary | ICD-10-CM | POA: Diagnosis not present

## 2024-09-18 DIAGNOSIS — E1169 Type 2 diabetes mellitus with other specified complication: Secondary | ICD-10-CM

## 2024-09-18 DIAGNOSIS — E785 Hyperlipidemia, unspecified: Secondary | ICD-10-CM | POA: Diagnosis not present

## 2024-09-18 DIAGNOSIS — J410 Simple chronic bronchitis: Secondary | ICD-10-CM | POA: Diagnosis not present

## 2024-09-18 DIAGNOSIS — G43011 Migraine without aura, intractable, with status migrainosus: Secondary | ICD-10-CM | POA: Diagnosis not present

## 2024-09-18 DIAGNOSIS — Z23 Encounter for immunization: Secondary | ICD-10-CM | POA: Diagnosis not present

## 2024-09-18 DIAGNOSIS — I471 Supraventricular tachycardia, unspecified: Secondary | ICD-10-CM | POA: Diagnosis not present

## 2024-09-18 DIAGNOSIS — R790 Abnormal level of blood mineral: Secondary | ICD-10-CM

## 2024-09-18 MED ORDER — PANTOPRAZOLE SODIUM 40 MG PO TBEC: 40.0000 mg | DELAYED_RELEASE_TABLET | Freq: Two times a day (BID) | ORAL | 1 refills | Status: DC

## 2024-09-18 NOTE — Progress Notes (Signed)
 Name: Brandy Johnston   MRN: [id number]    DOB: [birth year]   Date:09/18/2024       Progress Note  Subjective  Chief Complaint  Chief Complaint  Patient presents with   Medical Management of Chronic Issues   Discussed the use of AI scribe software for clinical note transcription with the patient, who gave verbal consent to proceed.  History of Present Illness Brandy Johnston is a 63 year old female with hypertrophic cardiomyopathy and supraventricular tachycardia who presents for a regular follow-up visit.  She recently underwent a procedure for hypertrophic cardiomyopathy and has been experiencing low blood pressure readings, with a recent measurement of 104/64 mmHg. She continues to experience shortness of breath, which occurs when walking in stores like [employer], where she feels the need to use a motorized cart or a walker with a bench to manage her symptoms. She tries to pace herself to avoid becoming too tired.  She is currently taking metoprolol  25 mg and diltiazem  240 mg for SVT and denies recent episodes of palpitation  She also uses inhalers for her asthma and receives injections every eight weeks for eosinophilic asthma.  She has type 2 diabetes with microalbuminuria and chronic kidney disease stage 3A. She takes Jardiance  25 mg and Ozempic  for diabetes management. Her blood sugar levels range from 70 to 140 mg/dL, and she uses Novolin insulin  occasionally when her blood sugar exceeds 240 mg/dL.  She is taking potassium supplements daily following a drop in potassium levels post-surgery. She is also on pantoprazole  once a day for reflux and rosuvastatin  for cholesterol management. She has a history of anemia following her hospital stay in August, which is being monitored.  She has chronic bronchitis and reports occasional wheezing and cough, which clears with coughing. She has a history of smoking marijuana but has since stopped. She takes gabapentin  for neuropathy and radiculitis  affecting both legs.  She is on Qulipta  for migraine prevention and has not experienced any recent migraines. She uses Ubrelvy  as needed for acute migraine episodes.    Patient Active Problem List   Diagnosis Date Noted   History of angiotensin converting enzyme inhibitor (ACE-I) allergy 06/11/2024   Prolonged QT interval 06/11/2024   SVT (supraventricular tachycardia) (HCC) 06/11/2024   Esophageal dysphagia 05/17/2024   Migraine without aura, intractable, with status migrainosus 05/17/2024   Hypertrophic cardiomyopathy (HCC) 05/17/2024   Dyslipidemia associated with type 2 diabetes mellitus (HCC) 05/17/2024   Small vessel disease (HCC) 01/26/2024   Severe persistent asthma 01/18/2024   Muscle spasms of both lower extremities 01/18/2024   Chronic left shoulder pain 11/09/2022   Hyperlipidemia 01/14/2021   Incomplete tear of left rotator cuff 07/03/2020   Angioedema due to angiotensin converting enzyme inhibitor (ACE-I) 01/29/2020   Bursitis of left shoulder 10/20/2018   History of arthroscopic procedure on shoulder 06/17/2018   Impingement syndrome of shoulder region 05/31/2018   Chronic bilateral back pain 07/26/2017   Coronary artery calcification 05/17/2017   Heart palpitations 05/15/2017   History of acute gastritis    Atherosclerosis of abdominal aorta (HCC) 08/18/2016   Type 2 diabetes mellitus with stage 3 chronic kidney disease and hypertension (HCC) 12/16/2015   Diabetic neuropathy associated with type 2 diabetes mellitus (HCC) 09/18/2015   Insomnia 09/18/2015   Benign essential HTN 06/18/2015   Stage 3b chronic kidney disease (HCC) 06/18/2015   Diabetes mellitus with renal manifestation (HCC) 06/18/2015   Obesity (BMI 30.0-34.9) 06/18/2015   Degenerative arthritis of hip 06/18/2015  Sickle cell trait (HCC) 06/18/2015   Gastroesophageal reflux disease 05/25/2008    Past Surgical History:  Procedure Laterality Date   BREAST BIOPSY Right 12/28/2019   stereo [facility]  stromal fibrosis   CHOLECYSTECTOMY     COLONOSCOPY     ESOPHAGOGASTRODUODENOSCOPY (EGD) WITH PROPOFOL  N/A 10/05/2016   Procedure: ESOPHAGOGASTRODUODENOSCOPY (EGD) WITH PROPOFOL ;  Surgeon: Rogelia Copping, MD;  Location: [facility];  Service: Endoscopy;  Laterality: N/A;   FRACTURE SURGERY Left    cast and pins    SHOULDER ARTHROSCOPY WITH ROTATOR CUFF REPAIR AND SUBACROMIAL DECOMPRESSION Left 12/07/2018   Procedure: left shoulder manipulation under anesthesia, left shoulder arthroscopic lysis of adhesions;  Surgeon: Leora Lynwood SAUNDERS, MD;  Location: [facility];  Service: Orthopedics;  Laterality: Left;   SHOULDER CLOSED REDUCTION Left 12/07/2018   Procedure: CLOSED MANIPULATION SHOULDER;  Surgeon: Leora Lynwood SAUNDERS, MD;  Location: [facility];  Service: Orthopedics;  Laterality: Left;   shoulder surgery  Left 06/10/2018   Dr. Seward   TUBAL LIGATION      Family History  Problem Relation Age of Onset   Migraines Mother    Diabetes Mother    Cancer Mother        lung   Arthritis Brother    Breast cancer Maternal Aunt    Cancer Maternal Uncle        Lung and Colon   Cirrhosis Brother    Breast cancer Cousin     Social History   Tobacco Use   Smoking status: Never   Smokeless tobacco: Never  Substance Use Topics   Alcohol use: Yes    Alcohol/week: 0.0 standard drinks of alcohol    Comment: rare     Current Outpatient Medications:    albuterol  (VENTOLIN  HFA) 108 (90 Base) MCG/ACT inhaler, SMARTSIG:2 Puff(s) By Mouth Every 4-6 Hours PRN, Disp: , Rfl:    aspirin  (ASPIRIN  81) 81 MG chewable tablet, Chew 1 tablet (81 mg total) by mouth daily., Disp: 30 tablet, Rfl: 0   Atogepant  (QULIPTA ) 30 MG TABS, Take 1 tablet (30 mg total) by mouth every morning., Disp: 90 tablet, Rfl: 1   Baclofen  5 MG TABS, Take 1 tablet (5 mg total) by mouth 3 (three) times daily as needed (muscleskeletal pain or spasms). Sedating, do not take with other sedating medications and do not take and drive, Disp: 60  tablet, Rfl: 0   benralizumab (FASENRA PEN) 30 MG/ML prefilled autoinjector, Inject into the skin every 30 (thirty) days., Disp: , Rfl:    Blood Glucose Monitoring Suppl (CONTOUR NEXT ONE) KIT, USE TO TEST BLOOD SUGAR ONCE D, Disp: , Rfl:    budesonide -glycopyrrolate -formoterol  (BREZTRI  AEROSPHERE) 160-9-4.8 MCG/ACT AERO inhaler, Inhale 2 puffs into the lungs 2 (two) times daily., Disp: , Rfl:    Cholecalciferol (VITAMIN D ) 2000 units CAPS, Take 1 capsule (2,000 Units total) by mouth daily., Disp: 30 capsule, Rfl: 0   Continuous Glucose Receiver (FREESTYLE LIBRE 3 READER) DEVI, as directed., Disp: , Rfl:    Continuous Glucose Sensor (FREESTYLE LIBRE 3 PLUS SENSOR) MISC, Place 1 sensor on the skin every 15 days. Use to check glucose continuously, Disp: 6 each, Rfl: 1   cyclobenzaprine  (FLEXERIL ) 5 MG tablet, Take 1 tablet (5 mg total) by mouth at bedtime., Disp: 90 tablet, Rfl: 1   diclofenac  Sodium (VOLTAREN ) 1 % GEL, Apply 2 g topically 4 (four) times daily. (Patient taking differently: Apply 2 g topically 4 (four) times daily as needed.), Disp: 100 g, Rfl: 2   diltiazem  (  CARDIZEM  CD) 240 MG 24 hr capsule, Take 1 capsule by mouth daily., Disp: , Rfl:    empagliflozin  (JARDIANCE ) 25 MG TABS tablet, Take 1 tablet (25 mg total) by mouth daily before breakfast., Disp: 90 tablet, Rfl: 1   EPINEPHrine  0.3 mg/0.3 mL IJ SOAJ injection, Inject into the muscle., Disp: , Rfl:    ezetimibe  (ZETIA ) 10 MG tablet, Take 1 tablet (10 mg total) by mouth daily., Disp: 90 tablet, Rfl: 1   fluticasone  (FLONASE) 50 MCG/ACT nasal spray, 2 sprays each nostril Daily. DISP#  1 bottles = 1 month supply., Disp: , Rfl:    gabapentin  (NEURONTIN ) 300 MG capsule, Take 1 capsule (300 mg total) by mouth 3 (three) times daily., Disp: 270 capsule, Rfl: 1   glucose blood test strip, Use as instructed, Disp: 100 each, Rfl: 12   insulin  NPH Human (NOVOLIN N) 100 UNIT/ML injection, Inject into the skin as needed., Disp: , Rfl:     ipratropium-albuterol  (DUONEB) 0.5-2.5 (3) MG/3ML SOLN, INHALE THE CONTENTS OF 1 VIAL VIA NEBULIZER EVERY 6 HOURS AS NEEDED, Disp: 360 mL, Rfl: 3   levocetirizine (XYZAL ) 5 MG tablet, Take 1 tablet (5 mg total) by mouth every evening., Disp: 90 tablet, Rfl: 1   magnesium  oxide (MAG-OX) 400 MG tablet, TAKE 1 TABLET (400 MG TOTAL) BY MOUTH 2 (TWO) TIMES DAILY., Disp: 180 tablet, Rfl: 0   metoprolol  succinate (TOPROL -XL) 25 MG 24 hr tablet, Take 25 mg by mouth daily., Disp: , Rfl:    Microlet Lancets MISC, USE TO CHECK BLOOD SUGAR ONCE D, Disp: , Rfl:    montelukast  (SINGULAIR ) 10 MG tablet, Take 10 mg by mouth daily., Disp: , Rfl:    nystatin  (MYCOSTATIN ) 100000 UNIT/ML suspension, Take 5 mLs (500,000 Units total) by mouth 4 (four) times daily., Disp: 60 mL, Rfl: 0   ondansetron  (ZOFRAN -ODT) 4 MG disintegrating tablet, Take 1 tablet (4 mg total) by mouth every 8 (eight) hours as needed for nausea., Disp: 30 tablet, Rfl: 0   pantoprazole  (PROTONIX ) 40 MG tablet, Take 1 tablet (40 mg total) by mouth 2 (two) times daily., Disp: 180 tablet, Rfl: 0   potassium chloride  SA (KLOR-CON  M) 20 MEQ tablet, Take 1 tablet (20 mEq total) by mouth daily., Disp: 30 tablet, Rfl: 1   rosuvastatin  (CRESTOR ) 40 MG tablet, Take 1 tablet (40 mg total) by mouth daily., Disp: 90 tablet, Rfl: 1   Semaglutide ,0.25 or 0.5MG /DOS, (OZEMPIC , 0.25 OR 0.5 MG/DOSE,) 2 MG/3ML SOPN, Inject 0.5 mg into the skin once a week., Disp: 9 mL, Rfl: 1   Sharps Container (BD SHARPS COLLECTOR) MISC, Use as directed to dispose of Fasenra pen, Disp: , Rfl:    sodium chloride  0.9 % nebulizer solution, Inhale 3 mLs into the lungs 2 (two) times daily., Disp: , Rfl:    Spacer/Aero-Holding Chambers (OPTICHAMBER DIAMOND) MISC, , Disp: , Rfl:    triamcinolone  cream (KENALOG ) 0.1 %, Apply 1 Application topically as needed., Disp: , Rfl:    Ubrogepant  (UBRELVY ) 100 MG TABS, Take 1 tablet (100 mg total) by mouth daily as needed., Disp: 48 tablet, Rfl:  0  Allergies  Allergen Reactions   Ace Inhibitors Swelling    Angioedema    Lisinopril  Swelling    Face and neck swelling   Quetiapine     confusion    I personally reviewed active problem list, medication list, allergies with the patient/caregiver today.   ROS  Ten systems reviewed and is negative except as mentioned in HPI  Objective Physical Exam VITALS: BP- 104/64 CONSTITUTIONAL: Patient appears well-developed and well-nourished. No distress. HEENT: Head atraumatic, normocephalic, neck supple. CARDIOVASCULAR: Normal rate, regular rhythm and normal heart sounds. No murmur heard. No BLE edema. PULMONARY: Effort normal. Lungs clear to auscultation with bronchi present in left lower lung, clearing with cough. No respiratory distress. ABDOMINAL: There is no tenderness or distention. MUSCULOSKELETAL: Normal gait. Without gross motor or sensory deficit. PSYCHIATRIC: Patient has a normal mood and affect. Behavior is normal. Judgment and thought content normal.  Vitals:   09/18/24 1041  BP: 104/64  Pulse: 73  Resp: 16  SpO2: 98%  Weight: 146 lb 9.6 oz (66.5 kg)  Height: 5' 4 (1.626 m)    Body mass index is 25.16 kg/m.  No results found for this or any previous visit (from the past 2160 hours).  Diabetic Foot Exam:     PHQ2/9:    09/18/2024   10:36 AM 08/11/2024    9:46 AM 07/11/2024    9:27 AM 06/14/2024    1:37 PM 06/09/2024   11:30 AM  Depression screen PHQ 2/9  Decreased Interest 0 0 0 0 0  Down, Depressed, Hopeless 0 0 0 0 0  PHQ - 2 Score 0 0 0 0 0  Altered sleeping 0 0 0 0 0  Tired, decreased energy 0 0 0 0 0  Change in appetite 0 0 0 0 0  Feeling bad or failure about yourself  0 0 0 0 0  Trouble concentrating 0 0 0 0 0  Moving slowly or fidgety/restless 0 0 0 0 0  Suicidal thoughts 0 0 0 0 0  PHQ-9 Score 0 0 0 0 0  Difficult doing work/chores Not difficult at all Not difficult at all Not difficult at all Not difficult at all Not difficult at all     phq 9 is negative  Fall Risk:    09/18/2024   10:36 AM 08/11/2024    9:46 AM 07/11/2024    9:27 AM 06/14/2024    1:37 PM 06/09/2024   11:30 AM  Fall Risk   Falls in the past year? 1 1 1 1 1   Number falls in past yr: 0 0 0 0 0  Injury with Fall? 1 1 1 1 1   Risk for fall due to : Impaired balance/gait Impaired balance/gait Impaired balance/gait Impaired balance/gait Impaired balance/gait  Follow up Falls evaluation completed Falls evaluation completed Falls evaluation completed Education provided;Falls evaluation completed;Falls prevention discussed Falls evaluation completed     Assessment and Plan Assessment & Plan Type 2 diabetes mellitus with proteinuria and chronic kidney disease stage 3A Type 2 diabetes with microalbuminuria and chronic kidney disease stage 3A. Blood sugar levels are well-controlled with current medications. Chronic kidney disease requires monitoring of kidney function and associated parameters. - Check A1c today. - Check comprehensive metabolic panel (CMP) today. - Check urine microalbumin today. - Check parathyroid  hormone and vitamin D  levels today. - Continue Jardiance  25 mg daily. - Continue Ozempic  injections. - Monitor blood sugar levels with Freestyle Libre sensor.  Hypertrophic obstructive cardiomyopathy, status post septal ablation Status post successful septal ablation with marked improvement in left ventricular outflow tract gradient. Blood pressure is low but not concerning as per cardiologist. Shortness of breath attributed to asthma rather than cardiomyopathy. - Continue metoprolol  25 mg daily. - Continue diltiazem  240 mg daily. - Monitor for symptoms of chest pain or lightheadedness.  Supraventricular tachycardia Supraventricular tachycardia treated with adenosine . Currently managed with diltiazem . No recent episodes  reported. - Continue diltiazem  240 mg daily. - Monitor for episodes of supraventricular tachycardia.  Eosinophilic severe  persistent asthma and chronic bronchitis Asthma is well-controlled with current medication regimen, including benralizumab injections. Chronic bronchitis with occasional wheezing and shortness of breath, which improves with coughing. - Continue benralizumab injections every 8 weeks. - Continue montelukast . - Continue Breztri  inhaler. - Monitor for symptoms of wheezing and shortness of breath.  Atherosclerosis of aorta and small vessel disease Atherosclerosis of the aorta and small vessel disease. Cholesterol management is crucial for cardiovascular protection. - Continue rosuvastatin . - Monitor cholesterol levels.  Migraine Migraines are well-controlled with Qulipta . No recent migraine episodes reported. - Continue Qulipta  for migraine prevention. - Use Ubrelvy  as needed for acute migraine episodes.  Chronic pain with bilateral lower extremity radiculopathy and neuropathy Chronic pain with bilateral lower extremity radiculopathy and neuropathy. Managed with gabapentin  and Flexeril  as needed. - Continue gabapentin . - Use Flexeril  as needed for pain management.  Anemia, post-procedural Post-procedural anemia following recent hospital stay. Hemoglobin levels dropped significantly post-procedure. Monitoring required to ensure recovery. - Check complete blood count (CBC) today. - Check iron levels today.  Gastroesophageal reflux disease (GERD) GERD managed with pantoprazole . Currently taking once daily, which is appropriate for symptom control. - Continue pantoprazole  once daily.

## 2024-09-19 LAB — COMPREHENSIVE METABOLIC PANEL WITH GFR
AG Ratio: 1.3 (calc) (ref 1.0–2.5)
ALT: 39 U/L — ABNORMAL HIGH (ref 6–29)
AST: 40 U/L — ABNORMAL HIGH (ref 10–35)
Albumin: 4.3 g/dL (ref 3.6–5.1)
Alkaline phosphatase (APISO): 114 U/L (ref 37–153)
BUN/Creatinine Ratio: 6 (calc) (ref 6–22)
BUN: 8 mg/dL (ref 7–25)
CO2: 29 mmol/L (ref 20–32)
Calcium: 9.5 mg/dL (ref 8.6–10.4)
Chloride: 103 mmol/L (ref 98–110)
Creat: 1.42 mg/dL — ABNORMAL HIGH (ref 0.50–1.05)
Globulin: 3.2 g/dL (ref 1.9–3.7)
Glucose, Bld: 81 mg/dL (ref 65–99)
Potassium: 4.4 mmol/L (ref 3.5–5.3)
Sodium: 139 mmol/L (ref 135–146)
Total Bilirubin: 0.5 mg/dL (ref 0.2–1.2)
Total Protein: 7.5 g/dL (ref 6.1–8.1)
eGFR: 41 mL/min/1.73m2 — ABNORMAL LOW (ref >=60)

## 2024-09-19 LAB — CBC WITH DIFFERENTIAL/PLATELET
Absolute Lymphocytes: 2113 {cells}/uL (ref 850–3900)
Absolute Monocytes: 540 {cells}/uL (ref 200–950)
Basophils Absolute: 8 {cells}/uL (ref 0–200)
Basophils Relative: 0.1 %
Eosinophils Absolute: 0 {cells}/uL — ABNORMAL LOW (ref 15–500)
Eosinophils Relative: 0 %
HCT: 40.3 % (ref 35.0–45.0)
Hemoglobin: 12.8 g/dL (ref 11.7–15.5)
MCH: 26.5 pg — ABNORMAL LOW (ref 27.0–33.0)
MCHC: 31.8 g/dL — ABNORMAL LOW (ref 32.0–36.0)
MCV: 83.4 fL (ref 80.0–100.0)
MPV: 11 fL (ref 7.5–12.5)
Monocytes Relative: 7.1 %
Neutro Abs: 4940 {cells}/uL (ref 1500–7800)
Neutrophils Relative %: 65 %
Platelets: 283 Thousand/uL (ref 140–400)
RBC: 4.83 Million/uL (ref 3.80–5.10)
RDW: 15.2 % — ABNORMAL HIGH (ref 11.0–15.0)
Total Lymphocyte: 27.8 %
WBC: 7.6 Thousand/uL (ref 3.8–10.8)

## 2024-09-19 LAB — HEMOGLOBIN A1C
Hgb A1c MFr Bld: 5.9 % — ABNORMAL HIGH (ref <5.7)
Mean Plasma Glucose: 123 mg/dL
eAG (mmol/L): 6.8 mmol/L

## 2024-09-19 LAB — MICROALBUMIN / CREATININE URINE RATIO
Creatinine, Urine: 106 mg/dL (ref 20–275)
Microalb Creat Ratio: 16 mg/g{creat} (ref <30)
Microalb, Ur: 1.7 mg/dL

## 2024-09-19 LAB — PARATHYROID HORMONE, INTACT (NO CA): PTH: 66 pg/mL (ref 16–77)

## 2024-09-19 LAB — IRON,TIBC AND FERRITIN PANEL
%SAT: 15 % — ABNORMAL LOW (ref 16–45)
Ferritin: 43 ng/mL (ref 16–288)
Iron: 44 ug/dL — ABNORMAL LOW (ref 45–160)
TIBC: 292 ug/dL (ref 250–450)

## 2024-09-19 LAB — VITAMIN D 25 HYDROXY (VIT D DEFICIENCY, FRACTURES): Vit D, 25-Hydroxy: 37 ng/mL (ref 30–100)

## 2024-09-20 ENCOUNTER — Ambulatory Visit: Payer: Self-pay | Admitting: Family Medicine

## 2024-10-03 ENCOUNTER — Telehealth: Payer: Self-pay

## 2024-10-03 NOTE — Telephone Encounter (Unsigned)
 Copied from CRM [id number]. Topic: Clinical - Request for Lab/Test Order >> Oct 03, 2024  3:45 PM Antony RAMAN wrote: Reason for CRM: pt wants to know if she should keep taking potassium supplements before her lab draw or stay off the potassium supplements

## 2024-10-06 DIAGNOSIS — E876 Hypokalemia: Secondary | ICD-10-CM | POA: Diagnosis not present

## 2024-10-06 LAB — POTASSIUM: Potassium: 4.9 mmol/L (ref 3.5–5.3)

## 2024-10-09 ENCOUNTER — Other Ambulatory Visit: Payer: Self-pay | Admitting: Family Medicine

## 2024-10-09 NOTE — Telephone Encounter (Signed)
 Copied from CRM [id number]. Topic: Clinical - Medication Refill >> Oct 09, 2024 10:43 AM Delon HERO wrote: Medication: metoprolol  succinate (TOPROL -XL) 25 MG 24 hr tablet [[id number]]  Historical Provider- Prescribed in the hospital  Follow up with Dr. Glenard on 09/18/24  Has the patient contacted their pharmacy? Yes (Agent: If no, request that the patient contact the pharmacy for the refill. If patient does not wish to contact the pharmacy document the reason why and proceed with request.) (Agent: If yes, when and what did the pharmacy advise?)  This is the patient's preferred pharmacy:  [facility], [place] - [address] [place] [address] Phone: [phone] Fax: [phone]    Is this the correct pharmacy for this prescription? Yes If no, delete pharmacy and type the correct one.   Has the prescription been filled recently? Yes  Is the patient out of the medication? Yes  Has the patient been seen for an appointment in the last year OR does the patient have an upcoming appointment? Yes  Can we respond through MyChart? Yes  Agent: Please be advised that Rx refills may take up to 3 business days. We ask that you follow-up with your pharmacy.

## 2024-10-11 MED ORDER — METOPROLOL SUCCINATE ER 25 MG PO TB24: 25.0000 mg | ORAL_TABLET | Freq: Every day | ORAL | 5 refills | Status: DC

## 2024-10-11 NOTE — Telephone Encounter (Signed)
 Requested medication (s) are due for refill today: yes  Requested medication (s) are on the active medication list: no  Last refill:  08/11/24  Future visit scheduled: yes  Notes to clinic:  historical medication     Requested Prescriptions  Pending Prescriptions Disp Refills   metoprolol  succinate (TOPROL -XL) 25 MG 24 hr tablet 30 tablet 2    Sig: Take 1 tablet (25 mg total) by mouth daily.     Cardiovascular:  Beta Blockers Passed - 10/11/2024  9:41 AM      Passed - Last BP in normal range    BP Readings from Last 1 Encounters:  09/18/24 104/64         Passed - Last Heart Rate in normal range    Pulse Readings from Last 1 Encounters:  09/18/24 73         Passed - Valid encounter within last 6 months    Recent Outpatient Visits           3 weeks ago Hypertrophic cardiomyopathy [facility])   [facility] Glenard Mire, MD   2 months ago Hypertrophic cardiomyopathy [facility])    [facility] Glenard Mire, MD   3 months ago Hypertrophic cardiomyopathy [facility])    [facility] Glenard Mire, MD   3 months ago SVT (supraventricular tachycardia)   [facility] Glenard Mire, MD   4 months ago Loss of appetite   [facility] Leavy Mole, PA-C

## 2024-10-12 ENCOUNTER — Telehealth: Payer: Self-pay

## 2024-10-12 NOTE — Telephone Encounter (Signed)
 Refill request on Semaglutide ,0.25 or 0.5MG /DOS, (OZEMPIC , 0.25 OR 0.5 MG/DOSE,) 2 MG/3ML SOPN

## 2024-10-13 NOTE — Telephone Encounter (Signed)
 Pt has enough till January.

## 2024-10-14 DIAGNOSIS — N1831 Chronic kidney disease, stage 3a: Secondary | ICD-10-CM | POA: Diagnosis not present

## 2024-10-14 DIAGNOSIS — I509 Heart failure, unspecified: Secondary | ICD-10-CM | POA: Diagnosis not present

## 2024-10-14 DIAGNOSIS — E1122 Type 2 diabetes mellitus with diabetic chronic kidney disease: Secondary | ICD-10-CM | POA: Diagnosis not present

## 2024-10-14 DIAGNOSIS — K219 Gastro-esophageal reflux disease without esophagitis: Secondary | ICD-10-CM | POA: Diagnosis not present

## 2024-10-14 DIAGNOSIS — E7800 Pure hypercholesterolemia, unspecified: Secondary | ICD-10-CM | POA: Diagnosis not present

## 2024-10-14 DIAGNOSIS — R079 Chest pain, unspecified: Secondary | ICD-10-CM | POA: Diagnosis not present

## 2024-10-14 DIAGNOSIS — J069 Acute upper respiratory infection, unspecified: Secondary | ICD-10-CM | POA: Diagnosis not present

## 2024-10-14 DIAGNOSIS — I130 Hypertensive heart and chronic kidney disease with heart failure and stage 1 through stage 4 chronic kidney disease, or unspecified chronic kidney disease: Secondary | ICD-10-CM | POA: Diagnosis not present

## 2024-10-14 DIAGNOSIS — J45901 Unspecified asthma with (acute) exacerbation: Secondary | ICD-10-CM | POA: Diagnosis not present

## 2024-10-14 DIAGNOSIS — I129 Hypertensive chronic kidney disease with stage 1 through stage 4 chronic kidney disease, or unspecified chronic kidney disease: Secondary | ICD-10-CM | POA: Diagnosis not present

## 2024-10-17 ENCOUNTER — Telehealth: Payer: Self-pay

## 2024-10-17 ENCOUNTER — Telehealth: Payer: Self-pay | Admitting: Pharmacy Technician

## 2024-10-17 ENCOUNTER — Other Ambulatory Visit (HOSPITAL_COMMUNITY): Payer: Self-pay

## 2024-10-17 DIAGNOSIS — T887XXA Unspecified adverse effect of drug or medicament, initial encounter: Secondary | ICD-10-CM

## 2024-10-17 DIAGNOSIS — R630 Anorexia: Secondary | ICD-10-CM

## 2024-10-17 DIAGNOSIS — I129 Hypertensive chronic kidney disease with stage 1 through stage 4 chronic kidney disease, or unspecified chronic kidney disease: Secondary | ICD-10-CM

## 2024-10-17 MED ORDER — OZEMPIC (0.25 OR 0.5 MG/DOSE) 2 MG/3ML ~~LOC~~ SOPN: 0.5000 mg | PEN_INJECTOR | SUBCUTANEOUS | 0 refills | Status: DC

## 2024-10-17 NOTE — Telephone Encounter (Signed)
 Pharmacy Patient Advocate Encounter   Received notification from Onbase that prior authorization for Ozempic  (0.25 or 0.5 MG/DOSE) 2MG /3ML pen-injectors is required/requested.   Insurance verification completed.   The patient is insured through [employer].   Per test claim: The current 28 day co-pay is, $0.00.  No PA needed at this time. This test claim was processed through [facility]- copay amounts may vary at other pharmacies due to pharmacy/plan contracts, or as the patient moves through the different stages of their insurance plan.

## 2024-10-17 NOTE — Telephone Encounter (Signed)
 Copied from CRM #[id number]. Topic: Clinical - Medication Question >> Oct 17, 2024  9:14 AM Tonda B wrote: Reason for CRM: patient has questions about her rx  Semaglutide ,0.25 or 0.5MG /DOS, (OZEMPIC , 0.25 OR 0.5 MG/DOSE,) 2 MG/3ML SOPN  please call pt back [phone]

## 2024-10-17 NOTE — Telephone Encounter (Signed)
 Pt pharmacy out of ozempic  so sent to [facility]

## 2024-10-18 ENCOUNTER — Ambulatory Visit: Admitting: Family Medicine

## 2024-10-18 ENCOUNTER — Encounter: Payer: Self-pay | Admitting: Family Medicine

## 2024-10-18 VITALS — BP 124/82 | HR 75 | Temp 98.0°F | Resp 18 | Ht 64.0 in | Wt 150.3 lb

## 2024-10-18 DIAGNOSIS — R058 Other specified cough: Secondary | ICD-10-CM | POA: Diagnosis not present

## 2024-10-18 DIAGNOSIS — Z794 Long term (current) use of insulin: Secondary | ICD-10-CM | POA: Diagnosis not present

## 2024-10-18 DIAGNOSIS — J4551 Severe persistent asthma with (acute) exacerbation: Secondary | ICD-10-CM | POA: Diagnosis not present

## 2024-10-18 DIAGNOSIS — N183 Chronic kidney disease, stage 3 unspecified: Secondary | ICD-10-CM | POA: Diagnosis not present

## 2024-10-18 DIAGNOSIS — I129 Hypertensive chronic kidney disease with stage 1 through stage 4 chronic kidney disease, or unspecified chronic kidney disease: Secondary | ICD-10-CM

## 2024-10-18 DIAGNOSIS — E1122 Type 2 diabetes mellitus with diabetic chronic kidney disease: Secondary | ICD-10-CM | POA: Diagnosis not present

## 2024-10-18 MED ORDER — AIRSUPRA 90-80 MCG/ACT IN AERO: 2.0000 | INHALATION_SPRAY | Freq: Four times a day (QID) | RESPIRATORY_TRACT | 1 refills | Status: AC | PRN

## 2024-10-18 MED ORDER — AZITHROMYCIN 250 MG PO TABS: ORAL_TABLET | ORAL | 0 refills | Status: AC

## 2024-10-18 MED ORDER — BENZONATATE 100 MG PO CAPS: 100.0000 mg | ORAL_CAPSULE | Freq: Three times a day (TID) | ORAL | 0 refills | Status: DC | PRN

## 2024-10-18 NOTE — Progress Notes (Signed)
 Name: Brandy Johnston   MRN: [id number]    DOB: 1961-01-04   Date:10/18/2024       Progress Note  Subjective  Chief Complaint  Chief Complaint  Patient presents with   Medical Management of Chronic Issues    ER follow-up   Asthma   paperwork    Wants to see about  getting handicap sticker extended   Discussed the use of AI scribe software for clinical note transcription with the patient, who gave verbal consent to proceed.  History of Present Illness Brandy Johnston is a 63 year old female with severe persistent asthma who presents with shortness of breath and chest tightness.  She has been experiencing significant shortness of breath and chest tightness, which led her to visit the emergency room on Saturday. She describes difficulty breathing and a sensation of her lungs not filling properly, especially when attempting to cough. Her symptoms have not significantly improved since the ER visit, although she feels slightly better than on the day of the visit.  In the emergency room, she underwent a comprehensive workup including a chest x-ray, blood work, EKG, and a respiratory panel. The chest x-ray showed no acute cardiopulmonary process, and the respiratory panel was negative for adenovirus, coronavirus, rhinovirus, influenza A and B, and parainfluenza. Her BNP was slightly elevated at 329, and her EGFR was 47, consistent with her known chronic kidney disease. She was told in the emergency room that it was some kind of viral infection, possibly bronchitis, and was given prednisone , which she completed today, but reports no significant improvement in her symptoms.  She has a history of severe persistent asthma and uses albuterol  as needed, Breztri  (160 mcg) two puffs twice a day, and montelukast  10 mg daily. She also takes Xyzal  for allergies. She was recently put back on prednisone , 40 mg daily for five days, which she completed today. Despite this, her symptoms have remained stable and have not  improved significantly.  No fever or chills but she reports a thick, jelly-like phlegm. She experiences shortness of breath with minimal exertion, such as walking from the grocery store parking lot to the entrance. Her blood sugar levels have been elevated, reaching up to 280, which she managed with a small dose of insulin .    Patient Active Problem List   Diagnosis Date Noted   Stage 3a chronic kidney disease (HCC) 09/18/2024   History of angiotensin converting enzyme inhibitor (ACE-I) allergy 06/11/2024   Prolonged QT interval 06/11/2024   SVT (supraventricular tachycardia) 06/11/2024   Esophageal dysphagia 05/17/2024   Migraine without aura, intractable, with status migrainosus 05/17/2024   Hypertrophic cardiomyopathy (HCC) 05/17/2024   Dyslipidemia associated with type 2 diabetes mellitus (HCC) 05/17/2024   Small vessel disease 01/26/2024   Severe persistent asthma (HCC) 01/18/2024   Muscle spasms of both lower extremities 01/18/2024   Chronic left shoulder pain 11/09/2022   Hyperlipidemia 01/14/2021   Incomplete tear of left rotator cuff 07/03/2020   Angioedema due to angiotensin converting enzyme inhibitor (ACE-I) 01/29/2020   Bursitis of left shoulder 10/20/2018   History of arthroscopic procedure on shoulder 06/17/2018   Impingement syndrome of shoulder region 05/31/2018   Chronic bilateral back pain 07/26/2017   Coronary artery calcification 05/17/2017   Heart palpitations 05/15/2017   History of acute gastritis    Atherosclerosis of abdominal aorta 08/18/2016   Type 2 diabetes mellitus with stage 3 chronic kidney disease and hypertension (HCC) 12/16/2015   Diabetic neuropathy associated with type 2 diabetes mellitus (  HCC) 09/18/2015   Insomnia 09/18/2015   Benign essential HTN 06/18/2015   Stage 3b chronic kidney disease (HCC) 06/18/2015   Diabetes mellitus with renal manifestation (HCC) 06/18/2015   Obesity (BMI 30.0-34.9) 06/18/2015   Degenerative arthritis of hip  06/18/2015   Sickle cell trait 06/18/2015   Gastroesophageal reflux disease 05/25/2008    Past Surgical History:  Procedure Laterality Date   BREAST BIOPSY Right 12/28/2019   stereo [facility] stromal fibrosis   CHOLECYSTECTOMY     COLONOSCOPY     ESOPHAGOGASTRODUODENOSCOPY (EGD) WITH PROPOFOL  N/A 10/05/2016   Procedure: ESOPHAGOGASTRODUODENOSCOPY (EGD) WITH PROPOFOL ;  Surgeon: Rogelia Copping, MD;  Location: [facility];  Service: Endoscopy;  Laterality: N/A;   FRACTURE SURGERY Left    cast and pins    SHOULDER ARTHROSCOPY WITH ROTATOR CUFF REPAIR AND SUBACROMIAL DECOMPRESSION Left 12/07/2018   Procedure: left shoulder manipulation under anesthesia, left shoulder arthroscopic lysis of adhesions;  Surgeon: Leora Lynwood SAUNDERS, MD;  Location: [facility];  Service: Orthopedics;  Laterality: Left;   SHOULDER CLOSED REDUCTION Left 12/07/2018   Procedure: CLOSED MANIPULATION SHOULDER;  Surgeon: Leora Lynwood SAUNDERS, MD;  Location: [facility];  Service: Orthopedics;  Laterality: Left;   shoulder surgery  Left 06/10/2018   Dr. Seward   TUBAL LIGATION      Family History  Problem Relation Age of Onset   Migraines Mother    Diabetes Mother    Cancer Mother        lung   Arthritis Brother    Breast cancer Maternal Aunt    Cancer Maternal Uncle        Lung and Colon   Cirrhosis Brother    Breast cancer Cousin     Social History   Tobacco Use   Smoking status: Never   Smokeless tobacco: Never  Substance Use Topics   Alcohol use: Yes    Alcohol/week: 0.0 standard drinks of alcohol    Comment: rare     Current Outpatient Medications:    albuterol  (VENTOLIN  HFA) 108 (90 Base) MCG/ACT inhaler, SMARTSIG:2 Puff(s) By Mouth Every 4-6 Hours PRN, Disp: , Rfl:    aspirin  (ASPIRIN  81) 81 MG chewable tablet, Chew 1 tablet (81 mg total) by mouth daily., Disp: 30 tablet, Rfl: 0   Atogepant  (QULIPTA ) 30 MG TABS, Take 1 tablet (30 mg total) by mouth every morning., Disp: 90 tablet, Rfl: 1   Baclofen  5 MG  TABS, Take 1 tablet (5 mg total) by mouth 3 (three) times daily as needed (muscleskeletal pain or spasms). Sedating, do not take with other sedating medications and do not take and drive, Disp: 60 tablet, Rfl: 0   benralizumab (FASENRA PEN) 30 MG/ML prefilled autoinjector, Inject into the skin every 30 (thirty) days., Disp: , Rfl:    Blood Glucose Monitoring Suppl (CONTOUR NEXT ONE) KIT, USE TO TEST BLOOD SUGAR ONCE D, Disp: , Rfl:    budesonide -glycopyrrolate -formoterol  (BREZTRI  AEROSPHERE) 160-9-4.8 MCG/ACT AERO inhaler, Inhale 2 puffs into the lungs 2 (two) times daily., Disp: , Rfl:    Cholecalciferol (VITAMIN D ) 2000 units CAPS, Take 1 capsule (2,000 Units total) by mouth daily., Disp: 30 capsule, Rfl: 0   Continuous Glucose Sensor (FREESTYLE LIBRE 3 PLUS SENSOR) MISC, Place 1 sensor on the skin every 15 days. Use to check glucose continuously, Disp: 6 each, Rfl: 1   cyclobenzaprine  (FLEXERIL ) 5 MG tablet, Take 1 tablet (5 mg total) by mouth at bedtime., Disp: 90 tablet, Rfl: 1   diclofenac  Sodium (VOLTAREN ) 1 % GEL, Apply  2 g topically 4 (four) times daily. (Patient taking differently: Apply 2 g topically 4 (four) times daily as needed.), Disp: 100 g, Rfl: 2   diltiazem  (CARDIZEM  CD) 240 MG 24 hr capsule, Take 1 capsule by mouth daily., Disp: , Rfl:    empagliflozin  (JARDIANCE ) 25 MG TABS tablet, Take 1 tablet (25 mg total) by mouth daily before breakfast., Disp: 90 tablet, Rfl: 1   EPINEPHrine  0.3 mg/0.3 mL IJ SOAJ injection, Inject into the muscle., Disp: , Rfl:    ezetimibe  (ZETIA ) 10 MG tablet, Take 1 tablet (10 mg total) by mouth daily., Disp: 90 tablet, Rfl: 1   fluticasone  (FLONASE) 50 MCG/ACT nasal spray, 2 sprays each nostril Daily. DISP#  1 bottles = 1 month supply., Disp: , Rfl:    gabapentin  (NEURONTIN ) 300 MG capsule, Take 1 capsule (300 mg total) by mouth 3 (three) times daily., Disp: 270 capsule, Rfl: 1   glucose blood test strip, Use as instructed, Disp: 100 each, Rfl: 12    insulin  NPH Human (NOVOLIN N) 100 UNIT/ML injection, Inject into the skin as needed., Disp: , Rfl:    ipratropium-albuterol  (DUONEB) 0.5-2.5 (3) MG/3ML SOLN, INHALE THE CONTENTS OF 1 VIAL VIA NEBULIZER EVERY 6 HOURS AS NEEDED, Disp: 360 mL, Rfl: 3   levocetirizine (XYZAL ) 5 MG tablet, Take 1 tablet (5 mg total) by mouth every evening., Disp: 90 tablet, Rfl: 1   magnesium  oxide (MAG-OX) 400 MG tablet, TAKE 1 TABLET BY MOUTH TWICE DAILY, Disp: 180 tablet, Rfl: 11   metoprolol  succinate (TOPROL -XL) 25 MG 24 hr tablet, Take 1 tablet (25 mg total) by mouth daily., Disp: 30 tablet, Rfl: 5   Microlet Lancets MISC, USE TO CHECK BLOOD SUGAR ONCE D, Disp: , Rfl:    montelukast  (SINGULAIR ) 10 MG tablet, Take 10 mg by mouth daily., Disp: , Rfl:    nystatin  (MYCOSTATIN ) 100000 UNIT/ML suspension, Take 5 mLs (500,000 Units total) by mouth 4 (four) times daily., Disp: 60 mL, Rfl: 0   ondansetron  (ZOFRAN -ODT) 4 MG disintegrating tablet, Take 1 tablet (4 mg total) by mouth every 8 (eight) hours as needed for nausea., Disp: 30 tablet, Rfl: 0   pantoprazole  (PROTONIX ) 40 MG tablet, Take 1 tablet (40 mg total) by mouth 2 (two) times daily., Disp: 90 tablet, Rfl: 1   potassium chloride  SA (KLOR-CON  M) 20 MEQ tablet, Take 1 tablet (20 mEq total) by mouth daily., Disp: 30 tablet, Rfl: 1   rosuvastatin  (CRESTOR ) 40 MG tablet, Take 1 tablet (40 mg total) by mouth daily., Disp: 90 tablet, Rfl: 1   Semaglutide ,0.25 or 0.5MG /DOS, (OZEMPIC , 0.25 OR 0.5 MG/DOSE,) 2 MG/3ML SOPN, Inject 0.5 mg into the skin once a week., Disp: 9 mL, Rfl: 0   Sharps Container (BD SHARPS COLLECTOR) MISC, Use as directed to dispose of Fasenra pen, Disp: , Rfl:    sodium chloride  0.9 % nebulizer solution, Inhale 3 mLs into the lungs 2 (two) times daily., Disp: , Rfl:    Spacer/Aero-Holding Chambers (OPTICHAMBER DIAMOND) MISC, , Disp: , Rfl:    triamcinolone  cream (KENALOG ) 0.1 %, Apply 1 Application topically as needed., Disp: , Rfl:    Ubrogepant   (UBRELVY ) 100 MG TABS, Take 1 tablet (100 mg total) by mouth daily as needed., Disp: 48 tablet, Rfl: 0  Allergies  Allergen Reactions   Ace Inhibitors Swelling    Angioedema    Lisinopril  Swelling    Face and neck swelling   Quetiapine     confusion    I personally reviewed  active problem list, medication list, allergies, family history with the patient/caregiver today.   ROS  Ten systems reviewed and is negative except as mentioned in HPI    Objective Physical Exam CONSTITUTIONAL: Patient appears well-developed and well-nourished. No distress. HEENT: Head atraumatic, normocephalic, neck supple. CARDIOVASCULAR: Normal rate, regular rhythm and normal heart sounds. No murmur heard. No BLE edema. PULMONARY: Effort normal. Wheezing present anteriorly with asthmatic characteristics, end  expiratory wheezing. No respiratory distress. ABDOMINAL: There is no tenderness or distention. MUSCULOSKELETAL: Normal gait. Without gross motor or sensory deficit. PSYCHIATRIC: Patient has a normal mood and affect. Behavior is normal. Judgment and thought content normal.  Vitals:   10/18/24 0959  BP: 124/82  Pulse: 75  Resp: 18  Temp: 98 F (36.7 C)  SpO2: 99%  Weight: 150 lb 4.8 oz (68.2 kg)  Height: 5' 4 (1.626 m)    Body mass index is 25.8 kg/m.  Recent Results (from the past 2160 hours)  Comprehensive Metabolic Panel (CMET)     Status: Abnormal   Collection Time: 09/18/24 11:37 AM  Result Value Ref Range   Glucose, Bld 81 65 - 99 mg/dL    Comment: .            Fasting reference interval .    BUN 8 7 - 25 mg/dL    Comment: Verified by repeat analysis. .    Creat 1.42 (H) 0.50 - 1.05 mg/dL    Comment: Verified by repeat analysis. SABRA    eGFR 41 (L) > OR = 60 mL/min/1.6m2   BUN/Creatinine Ratio 6 6 - 22 (calc)   Sodium 139 135 - 146 mmol/L   Potassium 4.4 3.5 - 5.3 mmol/L   Chloride 103 98 - 110 mmol/L   CO2 29 20 - 32 mmol/L   Calcium  9.5 8.6 - 10.4 mg/dL   Total  Protein 7.5 6.1 - 8.1 g/dL   Albumin 4.3 3.6 - 5.1 g/dL   Globulin 3.2 1.9 - 3.7 g/dL (calc)   AG Ratio 1.3 1.0 - 2.5 (calc)   Total Bilirubin 0.5 0.2 - 1.2 mg/dL   Alkaline phosphatase (APISO) 114 37 - 153 U/L   AST 40 (H) 10 - 35 U/L   ALT 39 (H) 6 - 29 U/L  CBC with Differential/Platelet     Status: Abnormal   Collection Time: 09/18/24 11:37 AM  Result Value Ref Range   WBC 7.6 3.8 - 10.8 Thousand/uL   RBC 4.83 3.80 - 5.10 Million/uL   Hemoglobin 12.8 11.7 - 15.5 g/dL   HCT 59.6 64.9 - 54.9 %   MCV 83.4 80.0 - 100.0 fL   MCH 26.5 (L) 27.0 - 33.0 pg   MCHC 31.8 (L) 32.0 - 36.0 g/dL    Comment: For adults, a slight decrease in the calculated MCHC value (in the range of 30 to 32 g/dL) is most likely not clinically significant; however, it should be interpreted with caution in correlation with other red cell parameters and the patient's clinical condition.    RDW 15.2 (H) 11.0 - 15.0 %   Platelets 283 140 - 400 Thousand/uL   MPV 11.0 7.5 - 12.5 fL   Neutro Abs 4,940 1,500 - 7,800 cells/uL   Absolute Lymphocytes 2,113 850 - 3,900 cells/uL   Absolute Monocytes 540 200 - 950 cells/uL   Eosinophils Absolute 0 (L) 15 - 500 cells/uL   Basophils Absolute 8 0 - 200 cells/uL   Neutrophils Relative % 65 %   Total Lymphocyte 27.8 %  Monocytes Relative 7.1 %   Eosinophils Relative 0.0 %   Basophils Relative 0.1 %  Hemoglobin A1c     Status: Abnormal   Collection Time: 09/18/24 11:37 AM  Result Value Ref Range   Hgb A1c MFr Bld 5.9 (H) <5.7 %    Comment: For someone without known diabetes, a hemoglobin  A1c value between 5.7% and 6.4% is consistent with prediabetes and should be confirmed with a  follow-up test. . For someone with known diabetes, a value <7% indicates that their diabetes is well controlled. A1c targets should be individualized based on duration of diabetes, age, comorbid conditions, and other considerations. . This assay result is consistent with an increased  risk of diabetes. . Currently, no consensus exists regarding use of hemoglobin A1c for diagnosis of diabetes for children. .    Mean Plasma Glucose 123 mg/dL   eAG (mmol/L) 6.8 mmol/L  Urine Microalbumin w/creat. ratio     Status: None   Collection Time: 09/18/24 11:37 AM  Result Value Ref Range   Creatinine, Urine 106 20 - 275 mg/dL   Microalb, Ur 1.7 mg/dL    Comment: Reference Range Not established    Microalb Creat Ratio 16 <30 mg/g creat    Comment: . The ADA defines abnormalities in albumin excretion as follows: SABRA Albuminuria Category        Result (mg/g creatinine) . Normal to Mildly increased   <30 Moderately increased         30-299  Severely increased           > OR = 300 . The ADA recommends that at least two of three specimens collected within a 3-6 month period be abnormal before considering a patient to be within a diagnostic category.   Iron, TIBC and Ferritin Panel     Status: Abnormal   Collection Time: 09/18/24 11:37 AM  Result Value Ref Range   Iron 44 (L) 45 - 160 mcg/dL   TIBC 707 749 - 549 mcg/dL (calc)   %SAT 15 (L) 16 - 45 % (calc)   Ferritin 43 16 - 288 ng/mL  Parathyroid  hormone, intact (no Ca)     Status: None   Collection Time: 09/18/24 11:37 AM  Result Value Ref Range   PTH 66 16 - 77 pg/mL    Comment: . Interpretive Guide    Intact PTH           Calcium  ------------------    ----------           ------- Normal Parathyroid     Normal               Normal Hypoparathyroidism    Low or Low Normal    Low Hyperparathyroidism    Primary            Normal or High       High    Secondary          High                 Normal or Low    Tertiary           High                 High Non-Parathyroid     Hypercalcemia      Low or Low Normal    High .   VITAMIN D  25 Hydroxy (Vit-D Deficiency, Fractures)     Status: None   Collection Time: 09/18/24 11:37 AM  Result Value Ref Range   Vit D, 25-Hydroxy 37 30 - 100 ng/mL    Comment: Vitamin D  Status          25-OH Vitamin D : . Deficiency:                    <20 ng/mL Insufficiency:             20 - 29 ng/mL Optimal:                 > or = 30 ng/mL . For 25-OH Vitamin D  testing on patients on  D2-supplementation and patients for whom quantitation  of D2 and D3 fractions is required, the [facility](TM) 25-OH VIT D, (D2,D3), LC/MS/MS is recommended: order  code 07111 (patients >44yrs). . See Note 1 . Note 1 . For additional information, please refer to  http://education.[facility].com/faq/FAQ199  (This link is being provided for informational/ educational purposes only.)   Potassium     Status: None   Collection Time: 10/06/24  1:33 PM  Result Value Ref Range   Potassium 4.9 3.5 - 5.3 mmol/L      PHQ2/9:    09/18/2024   10:36 AM 08/11/2024    9:46 AM 07/11/2024    9:27 AM 06/14/2024    1:37 PM 06/09/2024   11:30 AM  Depression screen PHQ 2/9  Decreased Interest 0 0 0 0 0  Down, Depressed, Hopeless 0 0 0 0 0  PHQ - 2 Score 0 0 0 0 0  Altered sleeping 0 0 0 0 0  Tired, decreased energy 0 0 0 0 0  Change in appetite 0 0 0 0 0  Feeling bad or failure about yourself  0 0 0 0 0  Trouble concentrating 0 0 0 0 0  Moving slowly or fidgety/restless 0 0 0 0 0  Suicidal thoughts 0 0 0 0 0  PHQ-9 Score 0 0 0 0 0  Difficult doing work/chores Not difficult at all Not difficult at all Not difficult at all Not difficult at all Not difficult at all    phq 9 is negative  Fall Risk:    09/18/2024   10:36 AM 08/11/2024    9:46 AM 07/11/2024    9:27 AM 06/14/2024    1:37 PM 06/09/2024   11:30 AM  Fall Risk   Falls in the past year? 1 1 1 1 1   Number falls in past yr: 0 0 0 0 0  Injury with Fall? 1 1 1 1 1   Risk for fall due to : Impaired balance/gait Impaired balance/gait Impaired balance/gait Impaired balance/gait Impaired balance/gait  Follow up Falls evaluation completed Falls evaluation completed Falls evaluation completed Education provided;Falls evaluation  completed;Falls prevention discussed Falls evaluation completed      Assessment & Plan Severe persistent asthma with acute exacerbation Acute exacerbation with dyspnea, chest tightness, and productive cough. ER visit ruled out pneumonia and viral infections. Prednisone  ineffective. Inspiratory wheezing noted. - Prescribed Tessalon  Perles for cough suppression. - Prescribed Airsupra four times daily instead of albuterol . - Continue Breztri  twice daily. - Provided azithromycin  (Z-Pak) prescription for use if symptoms worsen. - Advised to avoid exertion and stay home during flare-ups.  Type 2 diabetes mellitus Blood glucose elevated to 280s due to prednisone . Managed with insulin  adjustments. - Advised to avoid carbohydrates and increase water  intake. - Monitor blood glucose closely, especially post-prednisone .  Chronic kidney disease stage 3a eGFR at 2 with no acute changes.  General Health Maintenance Flu  vaccination administered. Discussed COVID-19 booster timing. - Advise to receive COVID-19 booster next week after completing prednisone  course.

## 2024-10-19 NOTE — Progress Notes (Signed)
 Brandy Johnston                                          MRN: [id number]   10/19/2024   The [facility] Quality Team Specialist reviewed this patient medical record for the purposes of chart review for care gap closure. The following were reviewed: abstraction for care gap closure-glycemic status assessment.    [facility] Quality Team

## 2024-11-15 ENCOUNTER — Other Ambulatory Visit: Payer: Self-pay | Admitting: Family Medicine

## 2024-11-15 DIAGNOSIS — N1831 Chronic kidney disease, stage 3a: Secondary | ICD-10-CM

## 2024-11-15 DIAGNOSIS — R059 Cough, unspecified: Secondary | ICD-10-CM | POA: Diagnosis not present

## 2024-11-15 DIAGNOSIS — N183 Chronic kidney disease, stage 3 unspecified: Secondary | ICD-10-CM

## 2024-11-15 DIAGNOSIS — G8929 Other chronic pain: Secondary | ICD-10-CM

## 2024-11-15 DIAGNOSIS — G43011 Migraine without aura, intractable, with status migrainosus: Secondary | ICD-10-CM

## 2024-11-17 NOTE — Telephone Encounter (Signed)
 Requested Prescriptions  Pending Prescriptions Disp Refills   empagliflozin  (JARDIANCE ) 25 MG TABS tablet [Pharmacy Med Name: JARDIANCE  25 MG TABS 25 Tablet] 90 tablet 1    Sig: TAKE 1 TABLET BY MOUTH DAILY BEFORE BREAKFAST     Endocrinology:  Diabetes - SGLT2 Inhibitors Failed - 11/17/2024  2:27 PM      Failed - Cr in normal range and within 360 days    Creat  Date Value Ref Range Status  09/18/2024 1.42 (H) 0.50 - 1.05 mg/dL Final    Comment:    Verified by repeat analysis. .    Creatinine, Urine  Date Value Ref Range Status  09/18/2024 106 20 - 275 mg/dL Final         Failed - eGFR in normal range and within 360 days    GFR, Est African American  Date Value Ref Range Status  05/16/2021 56 (L) > OR = 60 mL/min/1.38m2 Final   GFR, Est Non African American  Date Value Ref Range Status  05/16/2021 48 (L) > OR = 60 mL/min/1.84m2 Final   GFR, Estimated  Date Value Ref Range Status  06/11/2024 41 (L) >60 mL/min Final    Comment:    (NOTE) Calculated using the CKD-EPI Creatinine Equation (2021)    eGFR  Date Value Ref Range Status  09/18/2024 41 (L) > OR = 60 mL/min/1.81m2 Final         Passed - HBA1C is between 0 and 7.9 and within 180 days    Hemoglobin A1C  Date Value Ref Range Status  07/08/2012 6.1 4.2 - 6.3 % Final    Comment:    The American Diabetes Association recommends that a primary goal of therapy should be <7% and that physicians should reevaluate the treatment regimen in patients with HbA1c values consistently >8%.    HbA1c, POC (prediabetic range)  Date Value Ref Range Status  09/28/2018 6.2 5.7 - 6.4 % Final   HbA1c, POC (controlled diabetic range)  Date Value Ref Range Status  01/09/2019 5.9 0.0 - 7.0 % Final   Hgb A1c MFr Bld  Date Value Ref Range Status  09/18/2024 5.9 (H) <5.7 % Final    Comment:    For someone without known diabetes, a hemoglobin  A1c value between 5.7% and 6.4% is consistent with prediabetes and should be confirmed  with a  follow-up test. . For someone with known diabetes, a value <7% indicates that their diabetes is well controlled. A1c targets should be individualized based on duration of diabetes, age, comorbid conditions, and other considerations. . This assay result is consistent with an increased risk of diabetes. . Currently, no consensus exists regarding use of hemoglobin A1c for diagnosis of diabetes for children. .    A1c  Date Value Ref Range Status  06/15/2024 6.7%  Final    Comment:    ABSTRACTED BY HIM         Passed - Valid encounter within last 6 months    Recent Outpatient Visits           1 month ago Acute exacerbation of severe persistent extrinsic asthma [facility])   [place] [facility] Mire, MD   2 months ago Hypertrophic cardiomyopathy [facility])   [place] [facility] Mire, MD   3 months ago Hypertrophic cardiomyopathy [facility])   [facility] Sow[facility], Krichna, MD   4 months ago Hypertrophic cardiomyopathy [facility])   [facility] Sowles, Krichna, MD  5 months ago SVT (supraventricular tachycardia)   [place] [facility] [place], Krichna, MD               QULIPTA  30 MG TABS [Pharmacy Med Name: QULIPTA  30 MG TABS 30 Tablet] 90 tablet 11    Sig: TAKE 1 TABLET BY MOUTH EVERY MORNING     Off-Protocol Failed - 11/17/2024  2:27 PM      Failed - Medication not assigned to a protocol, review manually.      Passed - Valid encounter within last 12 months    Recent Outpatient Visits           1 month ago Acute exacerbation of severe persistent extrinsic asthma [facility])   [place] Glenard Mire, MD   2 months ago Hypertrophic cardiomyopathy [facility])   [place] [facility] Glenard Mire, MD   3 months ago Hypertrophic cardiomyopathy [facility])   [place] [facility] Glen[facility] Mire, MD   4  months ago Hypertrophic cardiomyopathy [facility])   [place] [facility] Glenard Mire, MD   5 months ago SVT (supraventricular tachycardia)   [place] [facility] [place], Krichna, MD               cyclobenzaprine  (FLEXERIL ) 5 MG tablet [Pharmacy Med Name: CYCLOBENZAPRINE  5MG  TAB 5 Tablet] 90 tablet 11    Sig: TAKE 1 TABLET BY MOUTH AT BEDTIME     Not Delegated - Analgesics:  Muscle Relaxants Failed - 11/17/2024  2:27 PM      Failed - This refill cannot be delegated      Passed - Valid encounter within last 6 months    Recent Outpatient Visits           1 month ago Acute exacerbation of severe persistent extrinsic asthma [facility])   [place] [facility] Glen[facility] Mire, MD   2 months ago Hypertrophic cardiomyopathy [facility])   [place] Mire, MD   3 months ago Hypertrophic cardiomyopathy [facility])   [place] [facility] Glen[facility] Mire, MD   4 months ago Hypertrophic cardiomyopathy [facility])   [place] [facility] Sowles, Krichna, MD   5 months ago SVT (supraventricular tachycardia)   [facility] Sowles, Krichna, MD

## 2024-11-17 NOTE — Telephone Encounter (Signed)
 Requested medications are due for refill today.  yes  Requested medications are on the active medications list.  yes  Last refill. 05/17/2024 6 month supply for both  Future visit scheduled.   yes  Notes to clinic.  One medication is not delegated, the other does not have a protocol assigned. Please review for refill.    Requested Prescriptions  Pending Prescriptions Disp Refills   QULIPTA  30 MG TABS [Pharmacy Med Name: QULIPTA  30 MG TABS 30 Tablet] 90 tablet 11    Sig: TAKE 1 TABLET BY MOUTH EVERY MORNING     Off-Protocol Failed - 11/17/2024  2:27 PM      Failed - Medication not assigned to a protocol, review manually.      Passed - Valid encounter within last 12 months    Recent Outpatient Visits           1 month ago Acute exacerbation of severe persistent extrinsic asthma [facility])   [place] [facility] Glenard Mire, MD   2 months ago Hypertrophic [facility])   [place] [facility] Glen[facility] Mire, MD   3 months ago Hypertrophic cardiomyopathy [facility])   [place] [facility] Glenard Mire, MD   4 months ago Hypertrophic cardiomyopathy [facility])   [place] [facility] Glenard Mire, MD   5 months ago SVT (supraventricular tachycardia)   [place] [facility] [place], Krichna, MD               cyclobenzaprine  (FLEXERIL ) 5 MG tablet [Pharmacy Med Name: CYCLOBENZAPRINE  5MG  TAB 5 Tablet] 90 tablet 11    Sig: TAKE 1 TABLET BY MOUTH AT BEDTIME     Not Delegated - Analgesics:  Muscle Relaxants Failed - 11/17/2024  2:27 PM      Failed - This refill cannot be delegated      Passed - Valid encounter within last 6 months    Recent Outpatient Visits           1 month ago Acute exacerbation of severe persistent extrinsic asthma [facility])   [place] [facility] Glen[facility] Mire, MD   2 months ago Hypertrophic cardiomyopathy [facility])   [place] [facility] Mire, MD   3 months ago Hypertrophic cardiomyopathy [facility])   [facility] Mire, MD   4 months ago Hypertrophic cardiomyopathy [facility])   [place] [facility] Glen[facility] Mire, MD   5 months ago SVT (supraventricular tachycardia)    [facility], Krichna, MD              Signed Prescriptions Disp Refills   empagliflozin  (JARDIANCE ) 25 MG TABS tablet 90 tablet 1    Sig: TAKE 1 TABLET BY MOUTH DAILY BEFORE BREAKFAST     Endocrinology:  Diabetes - SGLT2 Inhibitors Failed - 11/17/2024  2:27 PM      Failed - Cr in normal range and within 360 days    Creat  Date Value Ref Range Status  09/18/2024 1.42 (H) 0.50 - 1.05 mg/dL Final    Comment:    Verified by repeat analysis. .    Creatinine, Urine  Date Value Ref Range Status  09/18/2024 106 20 - 275 mg/dL Final         Failed - eGFR in normal range and within 360 days    GFR, Est African American  Date Value Ref Range Status  05/16/2021 56 (L) > OR = 60 mL/min/1.8m2 Final  GFR, Est Non African American  Date Value Ref Range Status  05/16/2021 48 (L) > OR = 60 mL/min/1.69m2 Final   GFR, Estimated  Date Value Ref Range Status  06/11/2024 41 (L) >60 mL/min Final    Comment:    (NOTE) Calculated using the CKD-EPI Creatinine Equation (2021)    eGFR  Date Value Ref Range Status  09/18/2024 41 (L) > OR = 60 mL/min/1.70m2 Final         Passed - HBA1C is between 0 and 7.9 and within 180 days    Hemoglobin A1C  Date Value Ref Range Status  07/08/2012 6.1 4.2 - 6.3 % Final    Comment:    The American Diabetes Association recommends that a primary goal of therapy should be <7% and that physicians should reevaluate the treatment regimen in patients with HbA1c values consistently >8%.    HbA1c, POC (prediabetic range)  Date Value Ref Range Status  09/28/2018 6.2 5.7 - 6.4 % Final   HbA1c, POC (controlled diabetic range)   Date Value Ref Range Status  01/09/2019 5.9 0.0 - 7.0 % Final   Hgb A1c MFr Bld  Date Value Ref Range Status  09/18/2024 5.9 (H) <5.7 % Final    Comment:    For someone without known diabetes, a hemoglobin  A1c value between 5.7% and 6.4% is consistent with prediabetes and should be confirmed with a  follow-up test. . For someone with known diabetes, a value <7% indicates that their diabetes is well controlled. A1c targets should be individualized based on duration of diabetes, age, comorbid conditions, and other considerations. . This assay result is consistent with an increased risk of diabetes. . Currently, no consensus exists regarding use of hemoglobin A1c for diagnosis of diabetes for children. .    A1c  Date Value Ref Range Status  06/15/2024 6.7%  Final    Comment:    ABSTRACTED BY HIM         Passed - Valid encounter within last 6 months    Recent Outpatient Visits           1 month ago Acute exacerbation of severe persistent extrinsic asthma [facility])   [place] [facility] Glenard Mire, MD   2 months ago Hypertrophic cardiomyopathy [facility])   [place] [facility] Glen[facility] Mire, MD   3 months ago Hypertrophic cardiomyopathy [facility])   [place] [facility] Mire, MD   4 months ago Hypertrophic cardiomyopathy [facility])   [facility] Sow[facility], Krichna, MD   5 months ago SVT (supraventricular tachycardia)   [facility] Sow[facility], Krichna, MD

## 2024-11-27 ENCOUNTER — Ambulatory Visit: Payer: Self-pay

## 2024-11-27 ENCOUNTER — Other Ambulatory Visit: Payer: Self-pay | Admitting: Family Medicine

## 2024-11-27 NOTE — Telephone Encounter (Signed)
 Copied from CRM #[id number]. Topic: Clinical - Medication Question >> Nov 27, 2024  1:26 PM Rea ORN wrote: Reason for CRM: Pt stated she was prescribed Prednisone  and it caused oral thrush. She is asking for an rx to help with the thrush. Pt uses [facility] in [place], [place].   Please call back [phone]

## 2024-11-27 NOTE — Telephone Encounter (Signed)
 Outbound attempt unsuccessful.  Will need appt if requesting medication not on med list.

## 2024-11-30 NOTE — Telephone Encounter (Signed)
 Requested medications are due for refill today.  yes  Requested medications are on the active medications list.  yes  Last refill. 12/02/2023  Future visit scheduled.   yes  Notes to clinic.  Medication is historical.    Requested Prescriptions  Pending Prescriptions Disp Refills   albuterol  (VENTOLIN  HFA) 108 (90 Base) MCG/ACT inhaler [Pharmacy Med Name: ALBUTEROL  HFA *PROA* 108 (90 BAS Aerosol] 8.5 g 11    Sig: INHALE TWO (2) PUFFS BY MOUTH EVERY 4-6 HOURS AS NEEDED     Pulmonology:  Beta Agonists 2 Passed - 11/30/2024  2:15 PM      Passed - Last BP in normal range    BP Readings from Last 1 Encounters:  10/18/24 124/82         Passed - Last Heart Rate in normal range    Pulse Readings from Last 1 Encounters:  10/18/24 75         Passed - Valid encounter within last 12 months    Recent Outpatient Visits           1 month ago Acute exacerbation of severe persistent extrinsic asthma [facility])   [place] [facility] Mire, MD   2 months ago Hypertrophic cardiomyopathy [facility])   [place] [facility] Glen[facility] Mire, MD   3 months ago Hypertrophic [facility])   [place] [facility] Glenard Mire, MD   4 months ago Hypertrophic cardiomyopathy [facility])   [place] [facility] Mire, MD   5 months ago SVT (supraventricular tachycardia)   [facility] Sowles, Krichna, MD

## 2024-12-05 NOTE — Patient Instructions (Signed)
 Preventive Care 83 Years and Older, Female Preventive care refers to lifestyle choices and visits with your health care provider that can promote health and wellness. Preventive care visits are also called wellness exams. What can I expect for my preventive care visit? Counseling Your health care provider may ask you questions about your: Medical history, including: Past medical problems. Family medical history. Pregnancy and menstrual history. History of falls. Current health, including: Memory and ability to understand (cognition). Emotional well-being. Home life and relationship well-being. Sexual activity and sexual health. Lifestyle, including: Alcohol, nicotine or tobacco, and drug use. Access to firearms. Diet, exercise, and sleep habits. Work and work Astronomer. Sunscreen use. Safety issues such as seatbelt and bike helmet use. Physical exam Your health care provider will check your: Height and weight. These may be used to calculate your BMI (body mass index). BMI is a measurement that tells if you are at a healthy weight. Waist circumference. This measures the distance around your waistline. This measurement also tells if you are at a healthy weight and may help predict your risk of certain diseases, such as type 2 diabetes and high blood pressure. Heart rate and blood pressure. Body temperature. Skin for abnormal spots. What immunizations do I need?  Vaccines are usually given at various ages, according to a schedule. Your health care provider will recommend vaccines for you based on your age, medical history, and lifestyle or other factors, such as travel or where you work. What tests do I need? Screening Your health care provider may recommend screening tests for certain conditions. This may include: Lipid and cholesterol levels. Hepatitis C test. Hepatitis B test. HIV (human immunodeficiency virus) test. STI (sexually transmitted infection) testing, if you are at  risk. Lung cancer screening. Colorectal cancer screening. Diabetes screening. This is done by checking your blood sugar (glucose) after you have not eaten for a while (fasting). Mammogram. Talk with your health care provider about how often you should have regular mammograms. BRCA-related cancer screening. This may be done if you have a family history of breast, ovarian, tubal, or peritoneal cancers. Bone density scan. This is done to screen for osteoporosis. Talk with your health care provider about your test results, treatment options, and if necessary, the need for more tests. Follow these instructions at home: Eating and drinking  Eat a diet that includes fresh fruits and vegetables, whole grains, lean protein, and low-fat dairy products. Limit your intake of foods with high amounts of sugar, saturated fats, and salt. Take vitamin and mineral supplements as recommended by your health care provider. Do not drink alcohol if your health care provider tells you not to drink. If you drink alcohol: Limit how much you have to 0-1 drink a day. Know how much alcohol is in your drink. In the [place] one drink equals one 12 oz bottle of beer (355 mL), one 5 oz glass of wine (148 mL), or one 1 oz glass of hard liquor (44 mL). Lifestyle Brush your teeth every morning and night with fluoride toothpaste. Floss one time each day. Exercise for at least 30 minutes 5 or more days each week. Do not use any products that contain nicotine or tobacco. These products include cigarettes, chewing tobacco, and vaping devices, such as e-cigarettes. If you need help quitting, ask your health care provider. Do not use drugs. If you are sexually active, practice safe sex. Use a condom or other form of protection in order to prevent STIs. Take aspirin only as told by  your health care provider. Make sure that you understand how much to take and what form to take. Work with your health care provider to find out whether it  is safe and beneficial for you to take aspirin daily. Ask your health care provider if you need to take a cholesterol-lowering medicine (statin). Find healthy ways to manage stress, such as: Meditation, yoga, or listening to music. Journaling. Talking to a trusted person. Spending time with friends and family. Minimize exposure to UV radiation to reduce your risk of skin cancer. Safety Always wear your seat belt while driving or riding in a vehicle. Do not drive: If you have been drinking alcohol. Do not ride with someone who has been drinking. When you are tired or distracted. While texting. If you have been using any mind-altering substances or drugs. Wear a helmet and other protective equipment during sports activities. If you have firearms in your house, make sure you follow all gun safety procedures. What's next? Visit your health care provider once a year for an annual wellness visit. Ask your health care provider how often you should have your eyes and teeth checked. Stay up to date on all vaccines. This information is not intended to replace advice given to you by your health care provider. Make sure you discuss any questions you have with your health care provider. Document Revised: 06/11/2021 Document Reviewed: 06/11/2021 Elsevier Patient Education  2024 [employer].

## 2024-12-06 ENCOUNTER — Other Ambulatory Visit (HOSPITAL_COMMUNITY)
Admission: RE | Admit: 2024-12-06 | Discharge: 2024-12-06 | Disposition: A | Discharge status: Home / Self Care | Source: Ambulatory Visit | Attending: Family Medicine | Admitting: Family Medicine

## 2024-12-06 ENCOUNTER — Ambulatory Visit: Admitting: Family Medicine

## 2024-12-06 ENCOUNTER — Encounter: Payer: Self-pay | Admitting: Family Medicine

## 2024-12-06 VITALS — BP 108/68 | HR 68 | Resp 16 | Ht 64.0 in | Wt 144.7 lb

## 2024-12-06 DIAGNOSIS — B370 Candidal stomatitis: Secondary | ICD-10-CM

## 2024-12-06 DIAGNOSIS — J8283 Eosinophilic asthma: Secondary | ICD-10-CM | POA: Diagnosis not present

## 2024-12-06 DIAGNOSIS — Z780 Asymptomatic menopausal state: Secondary | ICD-10-CM

## 2024-12-06 DIAGNOSIS — E119 Type 2 diabetes mellitus without complications: Secondary | ICD-10-CM

## 2024-12-06 DIAGNOSIS — Z1231 Encounter for screening mammogram for malignant neoplasm of breast: Secondary | ICD-10-CM

## 2024-12-06 DIAGNOSIS — M810 Age-related osteoporosis without current pathological fracture: Secondary | ICD-10-CM | POA: Diagnosis not present

## 2024-12-06 DIAGNOSIS — M8580 Other specified disorders of bone density and structure, unspecified site: Secondary | ICD-10-CM

## 2024-12-06 DIAGNOSIS — Z01419 Encounter for gynecological examination (general) (routine) without abnormal findings: Secondary | ICD-10-CM

## 2024-12-06 DIAGNOSIS — Z113 Encounter for screening for infections with a predominantly sexual mode of transmission: Secondary | ICD-10-CM | POA: Insufficient documentation

## 2024-12-06 DIAGNOSIS — Z01411 Encounter for gynecological examination (general) (routine) with abnormal findings: Secondary | ICD-10-CM

## 2024-12-06 DIAGNOSIS — Z124 Encounter for screening for malignant neoplasm of cervix: Secondary | ICD-10-CM

## 2024-12-06 MED ORDER — NYSTATIN 100000 UNIT/ML MT SUSP: 5.0000 mL | Freq: Four times a day (QID) | OROMUCOSAL | 2 refills | Status: AC

## 2024-12-06 NOTE — Progress Notes (Signed)
 Name: Brandy Johnston   MRN: [id number]    DOB: 18-Dec-1961   Date:12/06/2024       Progress Note  Subjective  Chief Complaint  Chief Complaint  Patient presents with   Annual Exam    HPI  Patient presents for annual CPE.  Discussed the use of AI scribe software for clinical note transcription with the patient, who gave verbal consent to proceed.  History of Present Illness Brandy Johnston is a 63 year old female who presents for a well exam and Pap smear.  She has a history of eosinophilic asthma and recently completed a 10-day course of prednisone , 40 mg daily, for worsening congestion prior to a cruise. This treatment improved her symptoms, but she required an insulin  shot due to elevated blood sugar levels during this period.  She experiences oral thrush and reports using prednisone  and Breo, which contains corticosteroids. She manages the thrush with salt water  rinses and prefers nystatin  liquid for treatment.  Her blood pressure has been consistently low, around 108/68, and she notes a slight weight loss. She attributes some dietary changes to her recent prednisone  use, stating it improved her eating habits slightly, with more fruit consumption, although her overall diet remains suboptimal.  She has a history of diabetes and is attempting to follow a diabetic diet, eating smaller meals more frequently. No significant stomach pain is reported, and she acknowledges a need to increase her physical activity, currently exercising irregularly.  Her family history includes breast cancer on her mother's side, with her mother and aunt having had the disease. She has not been genetically tested for breast cancer.  She has been with the same sexual partner for three years and does not live with her.  She reports occasional urinary incontinence when coughing hard, a condition she has experienced for years. She also mentions a history of osteopenia and plans to schedule a bone density test  alongside her mammogram.  She has a son who is her designated medical decision-maker in case she cannot make decisions herself.      Diet: eating balanced meals. , small meals throughout the day  Exercise:  only 3 days a week but needs to increase  Last Eye Exam: completed Last Dental Exam: completed  Flowsheet Row Office Visit from 12/06/2024 in [facility]  AUDIT-C Score 0   Depression: Phq 9 is  negative    12/06/2024    9:31 AM 09/18/2024   10:36 AM 08/11/2024    9:46 AM 07/11/2024    9:27 AM 06/14/2024    1:37 PM  Depression screen PHQ 2/9  Decreased Interest 0 0 0 0 0  Down, Depressed, Hopeless 0 0 0 0 0  PHQ - 2 Score 0 0 0 0 0  Altered sleeping  0 0 0 0  Tired, decreased energy  0 0 0 0  Change in appetite  0 0 0 0  Feeling bad or failure about yourself   0 0 0 0  Trouble concentrating  0 0 0 0  Moving slowly or fidgety/restless  0 0 0 0  Suicidal thoughts  0 0 0 0  PHQ-9 Score  0  0  0  0   Difficult doing work/chores  Not difficult at all Not difficult at all Not difficult at all Not difficult at all     Data saved with a previous flowsheet row definition   Hypertension: BP Readings from Last 3 Encounters:  12/06/24 108/68  10/18/24  124/82  09/18/24 104/64   Obesity: Wt Readings from Last 3 Encounters:  12/06/24 144 lb 11.2 oz (65.6 kg)  10/18/24 150 lb 4.8 oz (68.2 kg)  09/18/24 146 lb 9.6 oz (66.5 kg)   BMI Readings from Last 3 Encounters:  12/06/24 24.84 kg/m  10/18/24 25.80 kg/m  09/18/24 25.16 kg/m     Vaccines: reviewed with the patient.   Hep C Screening: completed STD testing and prevention (HIV/chl/gon/syphilis): N/A Intimate partner violence: negative screen  Sexual History : past 3 years same partner, female  Menstrual History/LMP/Abnormal Bleeding: post menopausal  Discussed importance of follow up if any post-menopausal bleeding: yes  Incontinence Symptoms: positive when she coughs really hard   Breast  cancer:  - Last Mammogram: up to date  - BRCA gene screening:mother's side seems positive   Osteoporosis Prevention : Discussed high calcium  and vitamin D  supplementation, weight bearing exercises Bone density :yes   Cervical cancer screening: performing today  Skin cancer: Discussed monitoring for atypical lesions  Colorectal cancer: up to date   Lung cancer:  Low Dose CT Chest recommended if Age 55-80 years, 20 pack-year currently smoking OR have quit w/in 15years. Patient does not qualify for screen   ECG: 2025  Advanced Care Planning: A voluntary discussion about advance care planning including the explanation and discussion of advance directives.  Discussed health care proxy and Living will, and the patient was able to identify a health care proxy as  son.  Patient does not have a living will and power of attorney of health care   Patient Active Problem List   Diagnosis Date Noted   Stage 3a chronic kidney disease (HCC) 09/18/2024   History of angiotensin converting enzyme inhibitor (ACE-I) allergy 06/11/2024   Prolonged QT interval 06/11/2024   SVT (supraventricular tachycardia) 06/11/2024   Esophageal dysphagia 05/17/2024   Migraine without aura, intractable, with status migrainosus 05/17/2024   Hypertrophic cardiomyopathy (HCC) 05/17/2024   Dyslipidemia associated with type 2 diabetes mellitus (HCC) 05/17/2024   Small vessel disease 01/26/2024   Severe persistent asthma (HCC) 01/18/2024   Muscle spasms of both lower extremities 01/18/2024   Chronic left shoulder pain 11/09/2022   Hyperlipidemia 01/14/2021   Incomplete tear of left rotator cuff 07/03/2020   Angioedema due to angiotensin converting enzyme inhibitor (ACE-I) 01/29/2020   Bursitis of left shoulder 10/20/2018   History of arthroscopic procedure on shoulder 06/17/2018   Impingement syndrome of shoulder region 05/31/2018   Chronic bilateral back pain 07/26/2017   Coronary artery calcification 05/17/2017   Heart  palpitations 05/15/2017   History of acute gastritis    Atherosclerosis of abdominal aorta 08/18/2016   Type 2 diabetes mellitus with stage 3 chronic kidney disease and hypertension (HCC) 12/16/2015   Diabetic neuropathy associated with type 2 diabetes mellitus (HCC) 09/18/2015   Insomnia 09/18/2015   Benign essential HTN 06/18/2015   Stage 3b chronic kidney disease (HCC) 06/18/2015   Diabetes mellitus with renal manifestation (HCC) 06/18/2015   Obesity (BMI 30.0-34.9) 06/18/2015   Degenerative arthritis of hip 06/18/2015   Sickle cell trait 06/18/2015   Gastroesophageal reflux disease 05/25/2008    Past Surgical History:  Procedure Laterality Date   BREAST BIOPSY Right 12/28/2019   stereo [facility] stromal fibrosis   CHOLECYSTECTOMY     COLONOSCOPY     ESOPHAGOGASTRODUODENOSCOPY (EGD) WITH PROPOFOL  N/A 10/05/2016   Procedure: ESOPHAGOGASTRODUODENOSCOPY (EGD) WITH PROPOFOL ;  Surgeon: Rogelia Copping, MD;  Location: [facility];  Service: Endoscopy;  Laterality: N/A;   FRACTURE SURGERY  Left    cast and pins    SHOULDER ARTHROSCOPY WITH ROTATOR CUFF REPAIR AND SUBACROMIAL DECOMPRESSION Left 12/07/2018   Procedure: left shoulder manipulation under anesthesia, left shoulder arthroscopic lysis of adhesions;  Surgeon: Leora Lynwood SAUNDERS, MD;  Location: [facility];  Service: Orthopedics;  Laterality: Left;   SHOULDER CLOSED REDUCTION Left 12/07/2018   Procedure: CLOSED MANIPULATION SHOULDER;  Surgeon: Leora Lynwood SAUNDERS, MD;  Location: [facility];  Service: Orthopedics;  Laterality: Left;   shoulder surgery  Left 06/10/2018   Dr. Seward   TUBAL LIGATION      Family History  Problem Relation Age of Onset   Migraines Mother    Diabetes Mother    Cancer Mother        lung   Arthritis Brother    Breast cancer Maternal Aunt    Cancer Maternal Uncle        Lung and Colon   Cirrhosis Brother    Breast cancer Cousin     Social History   Socioeconomic History   Marital status: Single    Spouse  name: Not on file   Number of children: 2   Years of education: Not on file   Highest education level: 12th grade  Occupational History   Occupation: disabled  Tobacco Use   Smoking status: Never   Smokeless tobacco: Never  Vaping Use   Vaping status: Never Used  Substance and Sexual Activity   Alcohol use: Yes    Alcohol/week: 0.0 standard drinks of alcohol    Comment: rare   Drug use: Yes    Types: Marijuana    Comment: smokes marijuana occasionally   Sexual activity: Yes    Partners: Female    Birth control/protection: Other-see comments  Other Topics Concern   Not on file  Social History Narrative   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, on disability secondary to shoulder injury in 2019    She has two grown children ( boy and a girl)    Social Drivers of Corporate Investment Banker Strain: Low Risk  (12/06/2024)   Overall Financial Resource Strain (CARDIA)    Difficulty of Paying Living Expenses: Not hard at all  Food Insecurity: No Food Insecurity (06/11/2024)   Hunger Vital Sign    Worried About Running Out of Food in the Last Year: Never true    Ran Out of Food in the Last Year: Never true  Transportation Needs: No Transportation Needs (06/11/2024)   PRAPARE - Administrator, Civil Service (Medical): No    Lack of Transportation (Non-Medical): No  Physical Activity: Insufficiently Active (05/05/2024)   Exercise Vital Sign    Days of Exercise per Week: 3 days    Minutes of Exercise per Session: 20 min  Stress: No Stress Concern Present (05/05/2024)   [employer] of Occupational Health - Occupational Stress Questionnaire    Feeling of Stress : Not at all  Social Connections: Socially Isolated (05/05/2024)   Social Connection and Isolation Panel    Frequency of Communication with Friends and Family: More than three times a week    Frequency of Social Gatherings with Friends and Family: Twice a week    Attends Religious  Services: Never    Database Administrator or Organizations: No    Attends Banker Meetings: Never    Marital Status: Never married  Intimate Partner Violence: Patient Unable To Answer (07/14/2024)   [facility] from [facility]   Humiliation, Afraid, Rape, and Kick questionnaire    Within the last year, have you been afraid of your partner or ex-partner?: Patient unable to answer    Within the last year, have you been humiliated or emotionally abused in other ways by your partner or ex-partner?: Patient unable to answer    Within the last year, have you been kicked, hit, slapped, or otherwise physically hurt by your partner or ex-partner?: Patient unable to answer    Within the last year, have you been raped or forced to have any kind of sexual activity by your partner or ex-partner?: Patient unable to answer     Current Outpatient Medications:    albuterol  (VENTOLIN  HFA) 108 (90 Base) MCG/ACT inhaler, INHALE TWO (2) PUFFS BY MOUTH EVERY 4-6 HOURS AS NEEDED, Disp: 8.5 g, Rfl: 1   Albuterol -Budesonide  (AIRSUPRA ) 90-80 MCG/ACT AERO, Inhale 2 puffs into the lungs 4 (four) times daily as needed. In place of ventolin , Disp: 10.7 g, Rfl: 1   aspirin  (ASPIRIN  81) 81 MG chewable tablet, Chew 1 tablet (81 mg total) by mouth daily., Disp: 30 tablet, Rfl: 0   Atogepant  (QULIPTA ) 30 MG TABS, TAKE 1 TABLET BY MOUTH EVERY MORNING, Disp: 90 tablet, Rfl: 0   Baclofen  5 MG TABS, Take 1 tablet (5 mg total) by mouth 3 (three) times daily as needed (muscleskeletal pain or spasms). Sedating, do not take with other sedating medications and do not take and drive, Disp: 60 tablet, Rfl: 0   benralizumab (FASENRA PEN) 30 MG/ML prefilled autoinjector, Inject into the skin every 30 (thirty) days., Disp: , Rfl:    benzonatate  (TESSALON ) 100 MG capsule, Take 1-2 capsules (100-200 mg total) by mouth 3 (three) times daily as needed for cough., Disp: 40 capsule, Rfl: 0   Blood Glucose Monitoring Suppl (CONTOUR  NEXT ONE) KIT, USE TO TEST BLOOD SUGAR ONCE D, Disp: , Rfl:    budesonide -glycopyrrolate -formoterol  (BREZTRI  AEROSPHERE) 160-9-4.8 MCG/ACT AERO inhaler, Inhale 2 puffs into the lungs 2 (two) times daily., Disp: , Rfl:    Cholecalciferol (VITAMIN D ) 2000 units CAPS, Take 1 capsule (2,000 Units total) by mouth daily., Disp: 30 capsule, Rfl: 0   Continuous Glucose Sensor (FREESTYLE LIBRE 3 PLUS SENSOR) MISC, Place 1 sensor on the skin every 15 days. Use to check glucose continuously, Disp: 6 each, Rfl: 1   cyclobenzaprine  (FLEXERIL ) 5 MG tablet, TAKE 1 TABLET BY MOUTH AT BEDTIME, Disp: 90 tablet, Rfl: 0   diclofenac  Sodium (VOLTAREN ) 1 % GEL, Apply 2 g topically 4 (four) times daily. (Patient taking differently: Apply 2 g topically 4 (four) times daily as needed.), Disp: 100 g, Rfl: 2   diltiazem  (CARDIZEM  CD) 240 MG 24 hr capsule, Take 1 capsule by mouth daily., Disp: , Rfl:    empagliflozin  (JARDIANCE ) 25 MG TABS tablet, TAKE 1 TABLET BY MOUTH DAILY BEFORE BREAKFAST, Disp: 90 tablet, Rfl: 1   EPINEPHrine  0.3 mg/0.3 mL IJ SOAJ injection, Inject into the muscle., Disp: , Rfl:    ezetimibe  (ZETIA ) 10 MG tablet, Take 1 tablet (10 mg total) by mouth daily., Disp: 90 tablet, Rfl: 1   fluticasone  (FLONASE) 50 MCG/ACT nasal spray, 2 sprays each nostril Daily. DISP#  1 bottles = 1 month supply., Disp: , Rfl:    gabapentin  (NEURONTIN ) 300 MG capsule, Take 1 capsule (300 mg total) by mouth 3 (three) times daily., Disp: 270 capsule, Rfl: 1   glucose blood test strip, Use as instructed, Disp: 100 each, Rfl: 12  insulin  NPH Human (NOVOLIN N) 100 UNIT/ML injection, Inject into the skin as needed., Disp: , Rfl:    ipratropium-albuterol  (DUONEB) 0.5-2.5 (3) MG/3ML SOLN, INHALE THE CONTENTS OF 1 VIAL VIA NEBULIZER EVERY 6 HOURS AS NEEDED, Disp: 360 mL, Rfl: 3   levocetirizine (XYZAL ) 5 MG tablet, Take 1 tablet (5 mg total) by mouth every evening., Disp: 90 tablet, Rfl: 1   magnesium  oxide (MAG-OX) 400 MG tablet, TAKE  1 TABLET BY MOUTH TWICE DAILY, Disp: 180 tablet, Rfl: 11   metoprolol  succinate (TOPROL -XL) 25 MG 24 hr tablet, Take 1 tablet (25 mg total) by mouth daily., Disp: 30 tablet, Rfl: 5   Microlet Lancets MISC, USE TO CHECK BLOOD SUGAR ONCE D, Disp: , Rfl:    montelukast  (SINGULAIR ) 10 MG tablet, Take 10 mg by mouth daily., Disp: , Rfl:    nystatin  (MYCOSTATIN ) 100000 UNIT/ML suspension, Take 5 mLs (500,000 Units total) by mouth 4 (four) times daily., Disp: 60 mL, Rfl: 0   ondansetron  (ZOFRAN -ODT) 4 MG disintegrating tablet, Take 1 tablet (4 mg total) by mouth every 8 (eight) hours as needed for nausea., Disp: 30 tablet, Rfl: 0   pantoprazole  (PROTONIX ) 40 MG tablet, Take 1 tablet (40 mg total) by mouth 2 (two) times daily., Disp: 90 tablet, Rfl: 1   potassium chloride  SA (KLOR-CON  M) 20 MEQ tablet, Take 1 tablet (20 mEq total) by mouth daily., Disp: 30 tablet, Rfl: 1   predniSONE  (DELTASONE ) 20 MG tablet, Take 20 mg by mouth 2 (two) times daily., Disp: , Rfl:    rosuvastatin  (CRESTOR ) 40 MG tablet, Take 1 tablet (40 mg total) by mouth daily., Disp: 90 tablet, Rfl: 1   Semaglutide ,0.25 or 0.5MG /DOS, (OZEMPIC , 0.25 OR 0.5 MG/DOSE,) 2 MG/3ML SOPN, Inject 0.5 mg into the skin once a week., Disp: 9 mL, Rfl: 0   Sharps Container (BD SHARPS COLLECTOR) MISC, Use as directed to dispose of Fasenra pen, Disp: , Rfl:    sodium chloride  0.9 % nebulizer solution, Inhale 3 mLs into the lungs 2 (two) times daily., Disp: , Rfl:    Spacer/Aero-Holding Chambers (OPTICHAMBER DIAMOND) MISC, , Disp: , Rfl:    triamcinolone  cream (KENALOG ) 0.1 %, Apply 1 Application topically as needed., Disp: , Rfl:    Ubrogepant  (UBRELVY ) 100 MG TABS, Take 1 tablet (100 mg total) by mouth daily as needed., Disp: 48 tablet, Rfl: 0  Allergies  Allergen Reactions   Ace Inhibitors Swelling    Angioedema    Lisinopril  Swelling    Face and neck swelling   Quetiapine     confusion     ROS  Constitutional: Negative for fever , positive  for mild  weight change.  Respiratory: positive for cough but no shortness of breath.   Cardiovascular: Negative for chest pain or palpitations.  Gastrointestinal: Negative for abdominal pain, no bowel changes.  Musculoskeletal: Negative for gait problem or joint swelling.  Skin: Negative for rash.  Neurological: Negative for dizziness or headache.  No other specific complaints in a complete review of systems (except as listed in HPI above).   Objective  Vitals:   12/06/24 0935  BP: 108/68  Pulse: 68  Resp: 16  SpO2: 97%  Weight: 144 lb 11.2 oz (65.6 kg)  Height: 5' 4 (1.626 m)    Body mass index is 24.84 kg/m.  Physical Exam  Constitutional: Patient appears well-developed and well-nourished. No distress.  HENT: Head: Normocephalic and atraumatic. Ears: B TMs ok, no erythema or effusion; Nose: Nose normal. Mouth/Throat: Oropharynx  is clear and moist. No oropharyngeal exudate.  Eyes: Conjunctivae and EOM are normal. Pupils are equal, round, and reactive to light. No scleral icterus.  Neck: Normal range of motion. Neck supple. No JVD present. No thyromegaly present.  Cardiovascular: Normal rate, regular rhythm and normal heart sounds.  No murmur heard. No BLE edema. Pulmonary/Chest: Effort normal and breath sounds normal. No respiratory distress. Abdominal: Soft. Bowel sounds are normal, no distension. There is no tenderness. no masses Breast: no lumps or masses, no nipple discharge or rashes FEMALE GENITALIA:  External genitalia normal External urethra normal Vaginal vault normal without discharge or lesions Cervix normal without discharge or lesions Bimanual exam normal without masses RECTAL: not done  Musculoskeletal: Normal range of motion, no joint effusions. No gross deformities Neurological: he is alert and oriented to person, place, and time. No cranial nerve deficit. Coordination, balance, strength, speech and gait are normal.  Skin: Skin is warm and dry. No rash  noted. No erythema.  Psychiatric: Patient has a normal mood and affect. behavior is normal. Judgment and thought content normal.      Assessment & Plan Woman's Wellness Visit Routine wellness visit with low blood pressure and weight loss, possibly due to prednisone . Discussed dietary habits, exercise, family history of breast cancer, and STD screening. - Pap smear scheduled. - Encouraged daily exercise and classes like Silver Sneakers. - Discussed potential genetic testing for breast cancer. - Advised dental cleaning follow-up in March. - Discussed STD screening options.  Eosinophilic asthma Managed by lung specialist. Symptoms improved with prednisone . No current wheezing or shortness of breath. - Continue follow-up with lung specialist in three months.  Candidal stomatitis Likely secondary to prednisone  and inhaler use. Symptoms improved with salt water  rinses. Prefers nystatin  liquid. - Prescribed nystatin  liquid with refills. - Sent prescription to [facility].  Osteopenia after menopause No recent bone density scan. - Ordered bone density scan with mammogram in April.  Type 2 diabetes mellitus Managed with dietary modifications. Recent prednisone  use required additional insulin . Blood sugar levels monitored. - Continue monitoring blood sugar levels. - Encouraged adherence to diabetic diet.         -USPSTF grade A and B recommendations reviewed with patient; age-appropriate recommendations, preventive care, screening tests, etc discussed and encouraged; healthy living encouraged; see AVS for patient education given to patient -Discussed importance of 150 minutes of physical activity weekly, eat two servings of fish weekly, eat one serving of tree nuts ( cashews, pistachios, pecans, almonds.SABRA) every other day, eat 6 servings of fruit/vegetables daily and drink plenty of water  and avoid sweet beverages.   -Reviewed Health Maintenance: Yes.

## 2024-12-11 ENCOUNTER — Ambulatory Visit: Payer: Self-pay | Admitting: Family Medicine

## 2024-12-11 LAB — CYTOLOGY - PAP
Chlamydia: NEGATIVE
Comment: NEGATIVE
Comment: NEGATIVE
Comment: NORMAL
Diagnosis: NEGATIVE
High risk HPV: NEGATIVE
Neisseria Gonorrhea: NEGATIVE

## 2024-12-14 LAB — OPHTHALMOLOGY REPORT-SCANNED

## 2024-12-18 ENCOUNTER — Other Ambulatory Visit: Payer: Self-pay | Admitting: Family Medicine

## 2024-12-18 DIAGNOSIS — J45909 Unspecified asthma, uncomplicated: Secondary | ICD-10-CM

## 2024-12-18 DIAGNOSIS — G8929 Other chronic pain: Secondary | ICD-10-CM

## 2024-12-18 DIAGNOSIS — R053 Chronic cough: Secondary | ICD-10-CM

## 2024-12-18 DIAGNOSIS — R062 Wheezing: Secondary | ICD-10-CM

## 2024-12-18 DIAGNOSIS — J42 Unspecified chronic bronchitis: Secondary | ICD-10-CM

## 2024-12-18 DIAGNOSIS — N183 Chronic kidney disease, stage 3 unspecified: Secondary | ICD-10-CM

## 2024-12-19 ENCOUNTER — Ambulatory Visit: Payer: Self-pay

## 2024-12-19 NOTE — Telephone Encounter (Signed)
 FYI Only or Action Required?: FYI only for provider: ED advised.  Patient was last seen in primary care on 12/06/2024 by Brandy Mire, MD.  Called Nurse Triage reporting Numbness.  Symptoms began today.  Interventions attempted: Nothing.  Symptoms are: unchanged.  Triage Disposition: Call EMS 911 Now  Patient/caregiver understands and will follow disposition?: Yes- states will go to ED and have sister drive, declined calling [phone].       Copied from CRM #[id number]. Topic: Clinical - Red Word Triage >> Dec 19, 2024  4:10 PM Avram MATSU wrote: Red Word that prompted transfer to Nurse Triage: patient had a fall today and right leg went numb/cramping.   Reason for Disposition  [1] Numbness (i.e., loss of sensation) of the face, arm / hand, or leg / foot on one side of the body AND [2] sudden onset AND [3] present now  Answer Assessment - Initial Assessment Questions 1. SYMPTOM: What is the main symptom you are concerned about? (e.g., weakness, numbness)     Numbness and weakness to R) leg with sudden onset today upon standing, no history of this in the past.  Mild pain to leg as well.  Sudden onset resulted in fall today.  During call, patient reports leg still feeling a little funny, numb and has had some cramping lately to leg as well.   2. ONSET: When did this start? (e.g., minutes, hours, days; while sleeping)     1:15 PM ET;  was sitting in chair for a while before this happened.  Onset was sudden upon standing for medical appointment (shoulder injections every 3 months).   3. LAST NORMAL: When was the last time you (the patient) were normal (no symptoms)?     Currently feeling numb to R) thigh up to hip.   4. PATTERN Does this come and go, or has it been constant since it started?  Is it present now?     Constant, current.  Currently able to walk (no longer falling, previously fell due to numbness/weakness).   5. CARDIAC SYMPTOMS: Have you had any of the following  symptoms: chest pain, difficulty breathing, palpitations?     Denies any other symptoms.   6. NEUROLOGIC SYMPTOMS: Have you had any of the following symptoms: headache, dizziness, vision loss, double vision, changes in speech, unsteady on your feet?     New numbness and weakness today; history of leg cramps with low potassium but no history of numbness with low potassium. Was recently taken off potassium medication as levels had improved. Numbness is only to leg, no other extremity or elsewhere. No headache, dizziness, vision changes, trouble talking or swallowing, feeling off balance or unsteadiness per patient.   7. OTHER SYMPTOMS: Do you have any other symptoms?     Denies history of sciatica.  Reports a little bit of pain to the low back and groin area.  No loss of bowel or bladder control.  Denies feeling any facial droop or trouble smiling.  (Is not currently with anyone to assist in visual assessment).  Speaks clearly with ease and responds appropriately providing detailed history/timeline of symptoms today.  Reports history of neuropathy on left side, not right.   Fall- upon standing, fell to floor onto right side, caught self with right arm.  Denies any pain, bruising or swelling (no known injury) and denies hitting head.  No loss of consciousness reported.  Was at medical office for injections when occurred.  Chart review of medical history includes but not limited  to: cardiac history, hypertension, Type II Diabetes.  Triage RN offers to call 911 for patient, patient declines.  Protocols used: Neurologic Deficit-A-AH

## 2024-12-20 NOTE — Telephone Encounter (Signed)
 Requested Prescriptions  Pending Prescriptions Disp Refills   Continuous Glucose Sensor (FREESTYLE LIBRE 3 PLUS SENSOR) MISC [Pharmacy Med Name: FREESTYLE LIBRE 3*PLUS SENS Miscellaneous] 6 each 11    Sig: USE TO MONITOR BLOOD SUGAR AS DIRECTED. CHANGE SENSOR EVERY 15 DAYS.     There is no refill protocol information for this order     gabapentin  (NEURONTIN ) 300 MG capsule [Pharmacy Med Name: GABAPENTIN  300 MG CAPS 300 Capsule] 270 capsule 11    Sig: TAKE 1 CAPSULE BY MOUTH THREE TIMES DAILY     Neurology: Anticonvulsants - gabapentin  Failed - 12/20/2024 12:41 PM      Failed - Cr in normal range and within 360 days    Creat  Date Value Ref Range Status  09/18/2024 1.42 (H) 0.50 - 1.05 mg/dL Final    Comment:    Verified by repeat analysis. .    Creatinine, Urine  Date Value Ref Range Status  09/18/2024 106 20 - 275 mg/dL Final         Passed - Completed PHQ-2 or PHQ-9 in the last 360 days      Passed - Valid encounter within last 12 months    Recent Outpatient Visits           2 weeks ago Well woman exam   [facility] Mire, MD   2 months ago Acute exacerbation of severe persistent extrinsic asthma [facility])   [facility] Glenard Mire, MD   3 months ago Hypertrophic cardiomyopathy [facility])   [facility] Glenard Mire, MD   4 months ago Hypertrophic cardiomyopathy [facility])   [facility], Mire, MD   5 months ago Hypertrophic cardiomyopathy [facility])   [facility], Krichna, MD               ipratropium-albuterol  (DUONEB) 0.5-2.5 (3) MG/3ML SOLN [Pharmacy Med Name: IPRAT-ALBUT 0.5MG  60CT 0.5-2.5 (3) Solution] 360 mL 11    Sig: INHALE 1 VIAL VIA NEBULIZER EVERY 6 HOURS AS NEEDED     Pulmonology:  Combination Products - albuterol  / ipratropium Passed - 12/20/2024 12:41 PM      Passed - Last BP in normal range    BP  Readings from Last 1 Encounters:  12/06/24 108/68         Passed - Last Heart Rate in normal range    Pulse Readings from Last 1 Encounters:  12/06/24 68         Passed - Valid encounter within last 12 months    Recent Outpatient Visits           2 weeks ago Well woman exam   [facility] Mire, MD   2 months ago Acute exacerbation of severe persistent extrinsic asthma [facility])   [facility] Mire, MD   3 months ago Hypertrophic cardiomyopathy [facility])   [facility] Glenard Mire, MD   4 months ago Hypertrophic cardiomyopathy [facility])    [facility] Glenard Mire, MD   5 months ago Hypertrophic cardiomyopathy [facility])   [facility] Sowles, Krichna, MD

## 2024-12-25 ENCOUNTER — Other Ambulatory Visit: Payer: Self-pay | Admitting: Family Medicine

## 2024-12-25 DIAGNOSIS — T887XXA Unspecified adverse effect of drug or medicament, initial encounter: Secondary | ICD-10-CM

## 2024-12-25 DIAGNOSIS — E1122 Type 2 diabetes mellitus with diabetic chronic kidney disease: Secondary | ICD-10-CM

## 2024-12-25 DIAGNOSIS — R630 Anorexia: Secondary | ICD-10-CM

## 2024-12-26 NOTE — Telephone Encounter (Signed)
 Requested Prescriptions  Pending Prescriptions Disp Refills   OZEMPIC , 0.25 OR 0.5 MG/DOSE, 2 MG/3ML SOPN [Pharmacy Med Name: OZEMPIC  2 MG/3ML Subcutaneous Solution Pen-injector] 9 mL 0    Sig: INJECT 0.5MG  UNDER THE SKIN ONE TIME WEEKLY     Endocrinology:  Diabetes - GLP-1 Receptor Agonists - semaglutide  Failed - 12/26/2024  2:20 PM      Failed - HBA1C in normal range and within 180 days    Hemoglobin A1C  Date Value Ref Range Status  07/08/2012 6.1 4.2 - 6.3 % Final    Comment:    The American Diabetes Association recommends that a primary goal of therapy should be <7% and that physicians should reevaluate the treatment regimen in patients with HbA1c values consistently >8%.    HbA1c, POC (prediabetic range)  Date Value Ref Range Status  09/28/2018 6.2 5.7 - 6.4 % Final   HbA1c, POC (controlled diabetic range)  Date Value Ref Range Status  01/09/2019 5.9 0.0 - 7.0 % Final   Hgb A1c MFr Bld  Date Value Ref Range Status  09/18/2024 5.9 (H) <5.7 % Final    Comment:    For someone without known diabetes, a hemoglobin  A1c value between 5.7% and 6.4% is consistent with prediabetes and should be confirmed with a  follow-up test. . For someone with known diabetes, a value <7% indicates that their diabetes is well controlled. A1c targets should be individualized based on duration of diabetes, age, comorbid conditions, and other considerations. . This assay result is consistent with an increased risk of diabetes. . Currently, no consensus exists regarding use of hemoglobin A1c for diagnosis of diabetes for children. .    A1c  Date Value Ref Range Status  06/15/2024 6.7%  Final    Comment:    ABSTRACTED BY HIM         Failed - Cr in normal range and within 360 days    Creat  Date Value Ref Range Status  09/18/2024 1.42 (H) 0.50 - 1.05 mg/dL Final    Comment:    Verified by repeat analysis. .    Creatinine, Urine  Date Value Ref Range Status  09/18/2024 106 20  - 275 mg/dL Final         Passed - Valid encounter within last 6 months    Recent Outpatient Visits           2 weeks ago Well woman exam   [facility] Glen[facility] Mire, MD   2 months ago Acute exacerbation of severe persistent extrinsic asthma [facility])   [place] [facility] Glenard Mire, MD   3 months ago Hypertrophic cardiomyopathy [facility])   [facility] Glenard Mire, MD   4 months ago Hypertrophic cardiomyopathy [facility])    [facility] Sowles, Krichna, MD   5 months ago Hypertrophic cardiomyopathy [facility])   [facility] Sowles, Krichna, MD

## 2025-01-01 ENCOUNTER — Encounter: Payer: Self-pay | Admitting: Family Medicine

## 2025-01-01 ENCOUNTER — Telehealth: Payer: Self-pay | Admitting: Pharmacy Technician

## 2025-01-01 ENCOUNTER — Other Ambulatory Visit (HOSPITAL_COMMUNITY): Payer: Self-pay

## 2025-01-01 ENCOUNTER — Inpatient Hospital Stay: Admitting: Family Medicine

## 2025-01-01 VITALS — BP 108/68 | HR 68 | Resp 16 | Ht 64.0 in | Wt 142.2 lb

## 2025-01-01 DIAGNOSIS — M25551 Pain in right hip: Secondary | ICD-10-CM

## 2025-01-01 DIAGNOSIS — N1832 Chronic kidney disease, stage 3b: Secondary | ICD-10-CM | POA: Diagnosis not present

## 2025-01-01 DIAGNOSIS — J4551 Severe persistent asthma with (acute) exacerbation: Secondary | ICD-10-CM

## 2025-01-01 DIAGNOSIS — R058 Other specified cough: Secondary | ICD-10-CM

## 2025-01-01 MED ORDER — AZITHROMYCIN 250 MG PO TABS: ORAL_TABLET | ORAL | 0 refills | Status: AC

## 2025-01-01 NOTE — Progress Notes (Signed)
 Name: Brandy Johnston   MRN: [id number]    DOB: 1960/12/29   Date:01/01/2025       Progress Note  Subjective  Chief Complaint  Chief Complaint  Patient presents with   Fall    ED visit- R leg went numb at doc appt   Cough    Thick cloudy phlegm for 1.5 week   Discussed the use of AI scribe software for clinical note transcription with the patient, who gave verbal consent to proceed.  History of Present Illness Brandy Johnston is a 64 year old female with neuropathy and eosinophilic asthma who presents with hip pain after a fall.  She experienced a fall on December 23rd when her leg went numb as she was getting up for an appointment. She did not lose consciousness but fell due to the numbness, which was temporary, and she regained feeling shortly after. Since the fall, she has noticed pain in the top of her hip, causing occasional limping. The pain is described as being on the inside of her hip and has persisted for about ten days. [facility] that day and x-ray of hip was negative for fractures  She has a history of eosinophilic asthma and is currently experiencing a productive cough with thick mucus and wheezing. She last took prednisone  in November and has not used antibiotics recently. Her asthma management includes [employer], Breztri , and a nebulizer machine. The cough is persistent, and she can hear it rattling in her lungs.  She mentions needing cataract surgery for both eyes and has an upcoming appointment in [place]. Her kidney function has been gradually declining, with a creatinine level of 40 noted in November, and she has not seen a nephrologist recently.  She reports that she no longer experiences numbness in her leg, but continues to have pain in her hip.   Patient Active Problem List   Diagnosis Date Noted   Eosinophilic asthma 12/06/2024   Stage 3a chronic kidney disease (HCC) 09/18/2024   History of angiotensin converting enzyme inhibitor (ACE-I) allergy 06/11/2024    Prolonged QT interval 06/11/2024   SVT (supraventricular tachycardia) 06/11/2024   Esophageal dysphagia 05/17/2024   Migraine without aura, intractable, with status migrainosus 05/17/2024   Hypertrophic cardiomyopathy (HCC) 05/17/2024   Dyslipidemia associated with type 2 diabetes mellitus (HCC) 05/17/2024   Small vessel disease 01/26/2024   Severe persistent asthma (HCC) 01/18/2024   Muscle spasms of both lower extremities 01/18/2024   Chronic left shoulder pain 11/09/2022   Hyperlipidemia 01/14/2021   Incomplete tear of left rotator cuff 07/03/2020   Angioedema due to angiotensin converting enzyme inhibitor (ACE-I) 01/29/2020   Bursitis of left shoulder 10/20/2018   History of arthroscopic procedure on shoulder 06/17/2018   Impingement syndrome of shoulder region 05/31/2018   Chronic bilateral back pain 07/26/2017   Coronary artery calcification 05/17/2017   Heart palpitations 05/15/2017   History of acute gastritis    Atherosclerosis of abdominal aorta 08/18/2016   Type 2 diabetes mellitus with stage 3 chronic kidney disease and hypertension (HCC) 12/16/2015   Diabetic neuropathy associated with type 2 diabetes mellitus (HCC) 09/18/2015   Insomnia 09/18/2015   Benign essential HTN 06/18/2015   Stage 3b chronic kidney disease (HCC) 06/18/2015   Diabetes mellitus with renal manifestation (HCC) 06/18/2015   Obesity (BMI 30.0-34.9) 06/18/2015   Degenerative arthritis of hip 06/18/2015   Sickle cell trait 06/18/2015   Gastroesophageal reflux disease 05/25/2008    Past Surgical History:  Procedure Laterality Date  BREAST BIOPSY Right 12/28/2019   stereo [facility] stromal fibrosis   CHOLECYSTECTOMY     COLONOSCOPY     ESOPHAGOGASTRODUODENOSCOPY (EGD) WITH PROPOFOL  N/A 10/05/2016   Procedure: ESOPHAGOGASTRODUODENOSCOPY (EGD) WITH PROPOFOL ;  Surgeon: Rogelia Copping, MD;  [facility];  Service: Endoscopy;  Laterality: N/A;   FRACTURE SURGERY Left    cast and pins     SHOULDER ARTHROSCOPY WITH ROTATOR CUFF REPAIR AND SUBACROMIAL DECOMPRESSION Left 12/07/2018   Procedure: left shoulder manipulation under anesthesia, left shoulder arthroscopic lysis of adhesions;  Surgeon: Leora Lynwood SAUNDERS, MD;  Location: [facility];  Service: Orthopedics;  Laterality: Left;   SHOULDER CLOSED REDUCTION Left 12/07/2018   Procedure: CLOSED MANIPULATION SHOULDER;  Surgeon: Leora Lynwood SAUNDERS, MD;  Location: [facility];  Service: Orthopedics;  Laterality: Left;   shoulder surgery  Left 06/10/2018   Dr. Seward   TUBAL LIGATION      Family History  Problem Relation Age of Onset   Migraines Mother    Diabetes Mother    Cancer Mother        lung   Arthritis Brother    Breast cancer Maternal Aunt    Cancer Maternal Uncle        Lung and Colon   Cirrhosis Brother    Breast cancer Cousin     Social History   Tobacco Use   Smoking status: Never   Smokeless tobacco: Never  Substance Use Topics   Alcohol use: Yes    Alcohol/week: 0.0 standard drinks of alcohol    Comment: rare    Current Medications[1]  Allergies[2]  I personally reviewed active problem list, medication list, allergies, family history with the patient/caregiver today.   ROS  Ten systems reviewed and is negative except as mentioned in HPI    Objective Physical Exam CONSTITUTIONAL: Patient appears well-developed and well-nourished. No distress. HEENT: Head atraumatic, normocephalic, neck supple. CARDIOVASCULAR: Normal rate, regular rhythm and normal heart sounds. No murmur heard. No BLE edema. PULMONARY: Effort normal. Breath sounds reveal bronchi, no wheeze, no crackles. No respiratory distress. ABDOMINAL: There is no tenderness or distention. MUSCULOSKELETAL: Normal gait. Without gross motor or sensory deficit. Hip pain with movement, popping sensation, tight hips, pain with internal and external rotation, no pain during palpation of trochanteric bursa PSYCHIATRIC: Patient has a normal mood and affect.  Behavior is normal. Judgment and thought content normal.  Vitals:   01/01/25 1011  BP: 108/68  Pulse: 68  Resp: 16  SpO2: 97%  Weight: 142 lb 3.2 oz (64.5 kg)  Height: 5' 4 (1.626 m)    Body mass index is 24.41 kg/m.  Recent Results (from the past 2160 hours)  Potassium     Status: None   Collection Time: 10/06/24  1:33 PM  Result Value Ref Range   Potassium 4.9 3.5 - 5.3 mmol/L  Cytology - PAP     Status: None   Collection Time: 12/06/24 10:13 AM  Result Value Ref Range   High risk HPV Negative    Neisseria Gonorrhea Negative    Chlamydia Negative    Adequacy      Satisfactory for evaluation; transformation zone component PRESENT.   Diagnosis      - Negative for intraepithelial lesion or malignancy (NILM)   Comment Normal Reference Ranger Chlamydia - Negative    Comment      Normal Reference Range Neisseria Gonorrhea - Negative   Comment Normal Reference Range HPV - Negative   OPHTHALMOLOGY REPORT-SCANNED     Status: None  Collection Time: 12/14/24  9:21 AM  Result Value Ref Range   HM Diabetic Eye Exam No Retinopathy No Retinopathy    Comment: ABS BY HIM   A Comment        PHQ2/9:    01/01/2025    9:48 AM 12/06/2024    9:31 AM 09/18/2024   10:36 AM 08/11/2024    9:46 AM 07/11/2024    9:27 AM  Depression screen PHQ 2/9  Decreased Interest 0 0 0 0 0  Down, Depressed, Hopeless 0 0 0 0 0  PHQ - 2 Score 0 0 0 0 0  Altered sleeping   0 0 0  Tired, decreased energy   0 0 0  Change in appetite   0 0 0  Feeling bad or failure about yourself    0 0 0  Trouble concentrating   0 0 0  Moving slowly or fidgety/restless   0 0 0  Suicidal thoughts   0 0 0  PHQ-9 Score   0  0  0   Difficult doing work/chores   Not difficult at all Not difficult at all Not difficult at all     Data saved with a previous flowsheet row definition    phq 9 is negative  Fall Risk:    01/01/2025    9:48 AM 12/06/2024    9:31 AM 09/18/2024   10:36 AM 08/11/2024    9:46 AM 07/11/2024     9:27 AM  Fall Risk   Falls in the past year? 1 0 1 1 1   Number falls in past yr: 0 0 0 0 0  Injury with Fall? 0 0 1  1  1    Risk for fall due to : Impaired balance/gait No Fall Risks Impaired balance/gait Impaired balance/gait Impaired balance/gait  Follow up Falls evaluation completed Falls evaluation completed Falls evaluation completed Falls evaluation completed Falls evaluation completed     Data saved with a previous flowsheet row definition      Assessment & Plan Right hip pain Acute right hip pain post-fall, possible bursitis or bruise, no fracture on X-ray. - Referred to physical therapy for hip strengthening and pain management. - Advised use of ice and stretching exercises. - Instructed to contact [facility] for physical therapy referral if they are unable to take my referral , she would like to go to [facility]   Severe persistent asthma with acute exacerbation Acute exacerbation with productive cough and wheezing. Discussed risks of frequent prednisone  use. Decision to avoid prednisone  and use Z-Pak. - Prescribed Z-Pak for acute exacerbation. - Instructed to use Air Super every four hours on top of nebulizer. - Advised to monitor blood sugar levels due to potential increase from antibiotics. - Looks like just finished prednisone  mid Dec we will try holding off on prednisone  at this time  Chronic kidney disease stage 3 b Decreasing kidney function with recent eGFR of 40. - Recommended follow-up with nephrologist for kidney function monitoring.        [1]  Current Outpatient Medications:    albuterol  (VENTOLIN  HFA) 108 (90 Base) MCG/ACT inhaler, INHALE TWO (2) PUFFS BY MOUTH EVERY 4-6 HOURS AS NEEDED, Disp: 8.5 g, Rfl: 1   Albuterol -Budesonide  (AIRSUPRA ) 90-80 MCG/ACT AERO, Inhale 2 puffs into the lungs 4 (four) times daily as needed. In place of ventolin , Disp: 10.7 g, Rfl: 1   aspirin  (ASPIRIN  81) 81 MG chewable tablet, Chew 1 tablet (81 mg total) by mouth  daily., Disp: 30 tablet,  Rfl: 0   Atogepant  (QULIPTA ) 30 MG TABS, TAKE 1 TABLET BY MOUTH EVERY MORNING, Disp: 90 tablet, Rfl: 0   Baclofen  5 MG TABS, Take 1 tablet (5 mg total) by mouth 3 (three) times daily as needed (muscleskeletal pain or spasms). Sedating, do not take with other sedating medications and do not take and drive, Disp: 60 tablet, Rfl: 0   benralizumab (FASENRA PEN) 30 MG/ML prefilled autoinjector, Inject into the skin every 30 (thirty) days., Disp: , Rfl:    benzonatate  (TESSALON ) 100 MG capsule, Take 1-2 capsules (100-200 mg total) by mouth 3 (three) times daily as needed for cough., Disp: 40 capsule, Rfl: 0   Blood Glucose Monitoring Suppl (CONTOUR NEXT ONE) KIT, USE TO TEST BLOOD SUGAR ONCE D, Disp: , Rfl:    budesonide -glycopyrrolate -formoterol  (BREZTRI  AEROSPHERE) 160-9-4.8 MCG/ACT AERO inhaler, Inhale 2 puffs into the lungs 2 (two) times daily., Disp: , Rfl:    Cholecalciferol (VITAMIN D ) 2000 units CAPS, Take 1 capsule (2,000 Units total) by mouth daily., Disp: 30 capsule, Rfl: 0   Continuous Glucose Sensor (FREESTYLE LIBRE 3 PLUS SENSOR) MISC, USE TO MONITOR BLOOD SUGAR AS DIRECTED. CHANGE SENSOR EVERY 15 DAYS., Disp: 6 each, Rfl: 6   cyclobenzaprine  (FLEXERIL ) 5 MG tablet, TAKE 1 TABLET BY MOUTH AT BEDTIME, Disp: 90 tablet, Rfl: 0   diclofenac  Sodium (VOLTAREN ) 1 % GEL, Apply 2 g topically 4 (four) times daily. (Patient taking differently: Apply 2 g topically 4 (four) times daily as needed.), Disp: 100 g, Rfl: 2   diltiazem  (CARDIZEM  CD) 240 MG 24 hr capsule, Take 1 capsule by mouth daily., Disp: , Rfl:    empagliflozin  (JARDIANCE ) 25 MG TABS tablet, TAKE 1 TABLET BY MOUTH DAILY BEFORE BREAKFAST, Disp: 90 tablet, Rfl: 1   EPINEPHrine  0.3 mg/0.3 mL IJ SOAJ injection, Inject into the muscle., Disp: , Rfl:    ezetimibe  (ZETIA ) 10 MG tablet, Take 1 tablet (10 mg total) by mouth daily., Disp: 90 tablet, Rfl: 1   fluticasone  (FLONASE) 50 MCG/ACT nasal spray, 2 sprays each nostril  Daily. DISP#  1 bottles = 1 month supply., Disp: , Rfl:    gabapentin  (NEURONTIN ) 300 MG capsule, TAKE 1 CAPSULE BY MOUTH THREE TIMES DAILY, Disp: 270 capsule, Rfl: 1   glucose blood test strip, Use as instructed, Disp: 100 each, Rfl: 12   insulin  NPH Human (NOVOLIN N) 100 UNIT/ML injection, Inject into the skin as needed., Disp: , Rfl:    ipratropium-albuterol  (DUONEB) 0.5-2.5 (3) MG/3ML SOLN, INHALE 1 VIAL VIA NEBULIZER EVERY 6 HOURS AS NEEDED, Disp: 360 mL, Rfl: 1   levocetirizine (XYZAL ) 5 MG tablet, Take 1 tablet (5 mg total) by mouth every evening., Disp: 90 tablet, Rfl: 1   magnesium  oxide (MAG-OX) 400 MG tablet, TAKE 1 TABLET BY MOUTH TWICE DAILY, Disp: 180 tablet, Rfl: 11   metoprolol  succinate (TOPROL -XL) 25 MG 24 hr tablet, Take 1 tablet (25 mg total) by mouth daily., Disp: 30 tablet, Rfl: 5   Microlet Lancets MISC, USE TO CHECK BLOOD SUGAR ONCE D, Disp: , Rfl:    montelukast  (SINGULAIR ) 10 MG tablet, Take 10 mg by mouth daily., Disp: , Rfl:    nystatin  (MYCOSTATIN ) 100000 UNIT/ML suspension, Take 5 mLs (500,000 Units total) by mouth 4 (four) times daily., Disp: 60 mL, Rfl: 2   ondansetron  (ZOFRAN -ODT) 4 MG disintegrating tablet, Take 1 tablet (4 mg total) by mouth every 8 (eight) hours as needed for nausea., Disp: 30 tablet, Rfl: 0   pantoprazole  (PROTONIX ) 40 MG  tablet, Take 1 tablet (40 mg total) by mouth 2 (two) times daily., Disp: 90 tablet, Rfl: 1   potassium chloride  SA (KLOR-CON  M) 20 MEQ tablet, Take 1 tablet (20 mEq total) by mouth daily., Disp: 30 tablet, Rfl: 1   predniSONE  (DELTASONE ) 20 MG tablet, Take 20 mg by mouth 2 (two) times daily., Disp: , Rfl:    rosuvastatin  (CRESTOR ) 40 MG tablet, Take 1 tablet (40 mg total) by mouth daily., Disp: 90 tablet, Rfl: 1   Semaglutide ,0.25 or 0.5MG /DOS, (OZEMPIC , 0.25 OR 0.5 MG/DOSE,) 2 MG/3ML SOPN, INJECT 0.5MG  UNDER THE SKIN ONE TIME WEEKLY, Disp: 9 mL, Rfl: 0   Sharps Container (BD SHARPS COLLECTOR) MISC, Use as directed to dispose of  Fasenra pen, Disp: , Rfl:    sodium chloride  0.9 % nebulizer solution, Inhale 3 mLs into the lungs 2 (two) times daily., Disp: , Rfl:    Spacer/Aero-Holding Chambers (OPTICHAMBER DIAMOND) MISC, , Disp: , Rfl:    triamcinolone  cream (KENALOG ) 0.1 %, Apply 1 Application topically as needed., Disp: , Rfl:    Ubrogepant  (UBRELVY ) 100 MG TABS, Take 1 tablet (100 mg total) by mouth daily as needed., Disp: 48 tablet, Rfl: 0 [2]  Allergies Allergen Reactions   Ace Inhibitors Swelling    Angioedema    Lisinopril  Swelling    Face and neck swelling   Quetiapine     confusion

## 2025-01-01 NOTE — Telephone Encounter (Signed)
 Pharmacy Patient Advocate Encounter  Received notification from [employer] that Prior Authorization for Qulipta  30MG  tablets has been CANCELLED due to patient is now covered through [employer]   PA #/Case ID/Reference #: [id number]

## 2025-01-01 NOTE — Telephone Encounter (Signed)
 Pharmacy Patient Advocate Encounter   Received notification from CoverMyMeds that prior authorization for Qulipta  30MG  tablets is due for renewal.   Insurance verification completed.   The patient is insured through [employer].  Action: PA required; PA started via CoverMyMeds. KEY [id number] . Waiting for clinical questions to populate.

## 2025-01-12 ENCOUNTER — Other Ambulatory Visit: Payer: Self-pay | Admitting: Family Medicine

## 2025-01-12 NOTE — Telephone Encounter (Signed)
 Requested medications are due for refill today.  unsure  Requested medications are on the active medications list.  yes  Last refill. 12/20/2024 8.5 1 rf  Future visit scheduled.   yes  Notes to clinic.  Airsupra  was to replace this inhaler per med list. Please review for refill.    Requested Prescriptions  Pending Prescriptions Disp Refills   albuterol  (VENTOLIN  HFA) 108 (90 Base) MCG/ACT inhaler [Pharmacy Med Name: ALBUTEROL  HFA *PROA* 108 (90 BAS Aerosol] 8.5 g 10    Sig: INHALE TWO (2) PUFFS BY MOUTH EVERY 4-6 HOURS AS NEEDED     Pulmonology:  Beta Agonists 2 Passed - 01/12/2025  3:34 PM      Passed - Last BP in normal range    BP Readings from Last 1 Encounters:  01/01/25 108/68         Passed - Last Heart Rate in normal range    Pulse Readings from Last 1 Encounters:  01/01/25 68         Passed - Valid encounter within last 12 months    Recent Outpatient Visits           1 week ago Acute hip pain, right   [facility]ard Mire, [facility]   1 month ago Well woman exam   [facility] Glen[facility] Mire, MD   2 months ago Acute exacerbation of severe persistent extrinsic asthma [facility])   [facility] Glen[facility] Mire, MD   3 months ago Hypertrophic cardiomyopathy [facility])   [facility] Glenard Mire, MD   5 months ago Hypertrophic cardiomyopathy [facility])   [facility] Sowles, Krichna, MD

## 2025-01-23 ENCOUNTER — Ambulatory Visit: Admitting: Family Medicine

## 2025-01-23 ENCOUNTER — Encounter: Payer: Self-pay | Admitting: Family Medicine

## 2025-01-23 VITALS — BP 110/66 | HR 73 | Resp 16 | Ht 64.0 in | Wt 141.8 lb

## 2025-01-23 DIAGNOSIS — G8929 Other chronic pain: Secondary | ICD-10-CM

## 2025-01-23 DIAGNOSIS — G43009 Migraine without aura, not intractable, without status migrainosus: Secondary | ICD-10-CM

## 2025-01-23 DIAGNOSIS — J42 Unspecified chronic bronchitis: Secondary | ICD-10-CM | POA: Insufficient documentation

## 2025-01-23 DIAGNOSIS — M5442 Lumbago with sciatica, left side: Secondary | ICD-10-CM

## 2025-01-23 DIAGNOSIS — E1122 Type 2 diabetes mellitus with diabetic chronic kidney disease: Secondary | ICD-10-CM

## 2025-01-23 DIAGNOSIS — I739 Peripheral vascular disease, unspecified: Secondary | ICD-10-CM

## 2025-01-23 DIAGNOSIS — J8283 Eosinophilic asthma: Secondary | ICD-10-CM

## 2025-01-23 DIAGNOSIS — K219 Gastro-esophageal reflux disease without esophagitis: Secondary | ICD-10-CM

## 2025-01-23 DIAGNOSIS — T887XXA Unspecified adverse effect of drug or medicament, initial encounter: Secondary | ICD-10-CM

## 2025-01-23 DIAGNOSIS — R630 Anorexia: Secondary | ICD-10-CM

## 2025-01-23 DIAGNOSIS — I471 Supraventricular tachycardia, unspecified: Secondary | ICD-10-CM

## 2025-01-23 DIAGNOSIS — Z7985 Long-term (current) use of injectable non-insulin antidiabetic drugs: Secondary | ICD-10-CM

## 2025-01-23 DIAGNOSIS — E1169 Type 2 diabetes mellitus with other specified complication: Secondary | ICD-10-CM

## 2025-01-23 DIAGNOSIS — N183 Chronic kidney disease, stage 3 unspecified: Secondary | ICD-10-CM

## 2025-01-23 DIAGNOSIS — I422 Other hypertrophic cardiomyopathy: Secondary | ICD-10-CM

## 2025-01-23 DIAGNOSIS — I700 Atherosclerosis of aorta: Secondary | ICD-10-CM

## 2025-01-23 DIAGNOSIS — N1832 Chronic kidney disease, stage 3b: Secondary | ICD-10-CM

## 2025-01-23 LAB — POCT GLYCOSYLATED HEMOGLOBIN (HGB A1C): Hemoglobin A1C: 6.3 % — AB (ref 4.0–5.6)

## 2025-01-23 MED ORDER — MONTELUKAST SODIUM 10 MG PO TABS: 10.0000 mg | ORAL_TABLET | Freq: Every day | ORAL | 1 refills | Status: AC

## 2025-01-23 MED ORDER — OZEMPIC (0.25 OR 0.5 MG/DOSE) 2 MG/3ML ~~LOC~~ SOPN: 0.5000 mg | PEN_INJECTOR | SUBCUTANEOUS | 1 refills | Status: AC

## 2025-01-23 MED ORDER — ROSUVASTATIN CALCIUM 40 MG PO TABS: 40.0000 mg | ORAL_TABLET | Freq: Every day | ORAL | 1 refills | Status: AC

## 2025-01-23 MED ORDER — METOPROLOL SUCCINATE ER 25 MG PO TB24: 25.0000 mg | ORAL_TABLET | Freq: Every day | ORAL | 1 refills | Status: AC

## 2025-01-23 MED ORDER — CYCLOBENZAPRINE HCL 5 MG PO TABS: 5.0000 mg | ORAL_TABLET | Freq: Every day | ORAL | 0 refills | Status: AC

## 2025-01-23 MED ORDER — PANTOPRAZOLE SODIUM 40 MG PO TBEC: 40.0000 mg | DELAYED_RELEASE_TABLET | Freq: Two times a day (BID) | ORAL | 1 refills | Status: AC

## 2025-01-23 MED ORDER — LEVOCETIRIZINE DIHYDROCHLORIDE 5 MG PO TABS: 5.0000 mg | ORAL_TABLET | Freq: Every evening | ORAL | 1 refills | Status: AC

## 2025-01-23 MED ORDER — EZETIMIBE 10 MG PO TABS: 10.0000 mg | ORAL_TABLET | Freq: Every day | ORAL | 1 refills | Status: AC

## 2025-01-23 NOTE — Progress Notes (Signed)
 Name: Brandy Johnston   MRN: [id number]    DOB: 09/18/1961   Date:01/23/2025       Progress Note  Subjective  Chief Complaint  Chief Complaint  Patient presents with   Medical Management of Chronic Issues   Discussed the use of AI scribe software for clinical note transcription with the patient, who gave verbal consent to proceed.  History of Present Illness Brandy Johnston is a 64 year old female with type 2 diabetes, chronic kidney disease, and hypertension who presents for a four-month follow-up visit.  She has type 2 diabetes, with her last A1c at 6.3, improved from 7.5 in May of the previous year. She is on Ozempic  0.5 mg weekly and Jardiance , and uses insulin  only when her blood sugar levels rise significantly, such as after a cortisone shot. She experiences dry mouth, which she attributes to her medications, particularly cyclobenzaprine  taken at night. She has lost weight over the past year, moving from obesity to a normal BMI, but sometimes struggles with appetite.  Her chronic kidney disease is stage 3B, with the last eGFR at 40. She is not on any additional medications specifically for kidney protection beyond her current regimen.  She takes metoprolol  for hypertension, hypertrophic cardiomyopathy, and supraventricular tachycardia. She occasionally experiences shortness of breath and wheezing, attributed to her bronchitis. She is not on any diuretics or potassium supplements.  She has dyslipidemia, managed with rosuvastatin  40 mg and Zetia . She also takes gabapentin  three times a day for back pain and neuropathy, which helps with tingling sensations.  She has chronic bronchitis and eosinophilic asthma, using inhalers for management. She reports occasional wheezing and rattling sounds, particularly at night, and sometimes gets winded depending on her activity.  She has a history of migraine headaches, well-controlled with Ubrelvy  as needed. She underwent cataract surgery on her left eye  on January 12, 2025, and reports some sensitivity to brightness post-surgery, which has improved over the last two days.    Patient Active Problem List   Diagnosis Date Noted   Eosinophilic asthma 12/06/2024   Stage 3a chronic kidney disease (HCC) 09/18/2024   History of angiotensin converting enzyme inhibitor (ACE-I) allergy 06/11/2024   Prolonged QT interval 06/11/2024   SVT (supraventricular tachycardia) 06/11/2024   Esophageal dysphagia 05/17/2024   Migraine without aura, intractable, with status migrainosus 05/17/2024   Hypertrophic cardiomyopathy (HCC) 05/17/2024   Dyslipidemia associated with type 2 diabetes mellitus (HCC) 05/17/2024   Small vessel disease 01/26/2024   Severe persistent asthma (HCC) 01/18/2024   Muscle spasms of both lower extremities 01/18/2024   Chronic left shoulder pain 11/09/2022   Hyperlipidemia 01/14/2021   Incomplete tear of left rotator cuff 07/03/2020   Angioedema due to angiotensin converting enzyme inhibitor (ACE-I) 01/29/2020   Bursitis of left shoulder 10/20/2018   History of arthroscopic procedure on shoulder 06/17/2018   Impingement syndrome of shoulder region 05/31/2018   Chronic bilateral back pain 07/26/2017   Coronary artery calcification 05/17/2017   Heart palpitations 05/15/2017   History of acute gastritis    Atherosclerosis of abdominal aorta 08/18/2016   Type 2 diabetes mellitus with stage 3 chronic kidney disease and hypertension (HCC) 12/16/2015   Diabetic neuropathy associated with type 2 diabetes mellitus (HCC) 09/18/2015   Insomnia 09/18/2015   Benign essential HTN 06/18/2015   Stage 3b chronic kidney disease (HCC) 06/18/2015   Diabetes mellitus with renal manifestation (HCC) 06/18/2015   Obesity (BMI 30.0-34.9) 06/18/2015   Degenerative arthritis of hip 06/18/2015   Sickle  cell trait 06/18/2015   Gastroesophageal reflux disease 05/25/2008    Past Surgical History:  Procedure Laterality Date   BREAST BIOPSY Right  12/28/2019   stereo [facility] stromal fibrosis   CHOLECYSTECTOMY     COLONOSCOPY     ESOPHAGOGASTRODUODENOSCOPY (EGD) WITH PROPOFOL  N/A 10/05/2016   Procedure: ESOPHAGOGASTRODUODENOSCOPY (EGD) WITH PROPOFOL ;  Surgeon: Rogelia Copping, MD;  Location: [facility];  Service: Endoscopy;  Laterality: N/A;   FRACTURE SURGERY Left    cast and pins    SHOULDER ARTHROSCOPY WITH ROTATOR CUFF REPAIR AND SUBACROMIAL DECOMPRESSION Left 12/07/2018   Procedure: left shoulder manipulation under anesthesia, left shoulder arthroscopic lysis of adhesions;  Surgeon: Leora Lynwood SAUNDERS, MD;  Location: [facility];  Service: Orthopedics;  Laterality: Left;   SHOULDER CLOSED REDUCTION Left 12/07/2018   Procedure: CLOSED MANIPULATION SHOULDER;  Surgeon: Leora Lynwood SAUNDERS, MD;  Location: [facility];  Service: Orthopedics;  Laterality: Left;   shoulder surgery  Left 06/10/2018   Dr. Seward   TUBAL LIGATION      Family History  Problem Relation Age of Onset   Migraines Mother    Diabetes Mother    Cancer Mother        lung   Arthritis Brother    Breast cancer Maternal Aunt    Cancer Maternal Uncle        Lung and Colon   Cirrhosis Brother    Breast cancer Cousin     Social History   Tobacco Use   Smoking status: Never   Smokeless tobacco: Never  Substance Use Topics   Alcohol use: Yes    Alcohol/week: 0.0 standard drinks of alcohol    Comment: rare    Current Medications[1]  Allergies[2]  I personally reviewed active problem list, medication list, allergies, family history with the patient/caregiver today.   ROS  Ten systems reviewed and is negative except as mentioned in HPI    Objective Physical Exam  CONSTITUTIONAL: Patient appears well-developed and well-nourished. No distress. HEENT: Head atraumatic, normocephalic, neck supple. CARDIOVASCULAR: Normal rate, regular rhythm and normal heart sounds. No murmur heard. No BLE edema. PULMONARY: Effort normal. Breath sounds include bronchi, no wheeze.  No respiratory distress. ABDOMINAL: There is no tenderness or distention. PSYCHIATRIC: Patient has a normal mood and affect. Behavior is normal. Judgment and thought content normal.  Vitals:   01/23/25 1008  BP: 110/66  Pulse: 73  Resp: 16  SpO2: 97%  Weight: 141 lb 12.8 oz (64.3 kg)  Height: 5' 4 (1.626 m)    Body mass index is 24.34 kg/m.  Recent Results (from the past 2160 hours)  Cytology - PAP     Status: None   Collection Time: 12/06/24 10:13 AM  Result Value Ref Range   High risk HPV Negative    Neisseria Gonorrhea Negative    Chlamydia Negative    Adequacy      Satisfactory for evaluation; transformation zone component PRESENT.   Diagnosis      - Negative for intraepithelial lesion or malignancy (NILM)   Comment Normal Reference Ranger Chlamydia - Negative    Comment      Normal Reference Range Neisseria Gonorrhea - Negative   Comment Normal Reference Range HPV - Negative   OPHTHALMOLOGY REPORT-SCANNED     Status: None   Collection Time: 12/14/24  9:21 AM  Result Value Ref Range   HM Diabetic Eye Exam No Retinopathy No Retinopathy    Comment: ABS BY HIM   A Comment    POCT glycosylated  hemoglobin (Hb A1C)     Status: Abnormal   Collection Time: 01/23/25 10:13 AM  Result Value Ref Range   Hemoglobin A1C 6.3 (A) 4.0 - 5.6 %   HbA1c POC (<> result, manual entry)     HbA1c, POC (prediabetic range)     HbA1c, POC (controlled diabetic range)      Diabetic Foot Exam:     PHQ2/9:    01/23/2025   10:09 AM 01/01/2025    9:48 AM 12/06/2024    9:31 AM 09/18/2024   10:36 AM 08/11/2024    9:46 AM  Depression screen PHQ 2/9  Decreased Interest 0 0 0 0 0  Down, Depressed, Hopeless 0 0 0 0 0  PHQ - 2 Score 0 0 0 0 0  Altered sleeping    0 0  Tired, decreased energy    0 0  Change in appetite    0 0  Feeling bad or failure about yourself     0 0  Trouble concentrating    0 0  Moving slowly or fidgety/restless    0 0  Suicidal thoughts    0 0  PHQ-9 Score    0   0   Difficult doing work/chores    Not difficult at all Not difficult at all     Data saved with a previous flowsheet row definition    phq 9 is negative  Fall Risk:    01/23/2025    9:59 AM 01/01/2025    9:48 AM 12/06/2024    9:31 AM 09/18/2024   10:36 AM 08/11/2024    9:46 AM  Fall Risk   Falls in the past year? 1 1 0 1 1  Number falls in past yr: 0 0 0 0 0  Injury with Fall? 0 0 0 1  1   Risk for fall due to : Impaired balance/gait Impaired balance/gait No Fall Risks Impaired balance/gait Impaired balance/gait  Follow up Falls evaluation completed Falls evaluation completed Falls evaluation completed Falls evaluation completed Falls evaluation completed     Data saved with a previous flowsheet row definition     Assessment & Plan Type 2 diabetes mellitus with stage 3b chronic kidney disease and hypertension Diabetes well-controlled, CKD stage 3b with eGFR 40, BP low normal. Leonore not recommended due to cost and potassium retention risk. Dry mouth likely from cyclobenzaprine . - Continue Ozempic  0.5 mg weekly. - Continue Jardiance . - Encouraged adequate hydration. - Instructed to monitor kidney function and potassium levels. - Instructed to discuss Ozempic  coverage with referral coordinator.  Hypertrophic cardiomyopathy with supraventricular tachycardia Condition well-managed with metoprolol  and Cardizem . Occasional SOB and wheezing likely bronchitis-related. - Continue metoprolol  and Cardizem .  Chronic bronchitis and eosinophilic asthma Chronic bronchitis with occasional wheezing and phlegm. Asthma managed with Xyzal  and montelukast . - Continue Xyzal  and montelukast . - Consider Mucinex for phlegm management.  Dyslipidemia associated with type 2 diabetes mellitus Dyslipidemia managed with rosuvastatin  and ezetimibe . - Continue rosuvastatin  40 mg. - Continue ezetimibe .  Chronic low back pain with sciatica and neuropathy Chronic low back pain with sciatica managed with  gabapentin , aiding neuropathy symptoms. - Continue gabapentin  three times a day.  Migraine without aura Migraines well-controlled with Ubrelvy  as needed. - Continue Ubrelvy  as needed.  Gastroesophageal reflux disease GERD well-controlled with pantoprazole . No heartburn or nausea. - Continue pantoprazole .  Osteopenia Managed with physical activity and high calcium  diet. - Encouraged physical activity and high calcium  diet.  History of cataract surgery, left eye Recent cataract surgery  with initial brightness issues, now improved.        [1]  Current Outpatient Medications:    albuterol  (VENTOLIN  HFA) 108 (90 Base) MCG/ACT inhaler, INHALE TWO (2) PUFFS BY MOUTH EVERY 4-6 HOURS AS NEEDED, Disp: 8.5 g, Rfl: 1   Albuterol -Budesonide  (AIRSUPRA ) 90-80 MCG/ACT AERO, Inhale 2 puffs into the lungs 4 (four) times daily as needed. In place of ventolin , Disp: 10.7 g, Rfl: 1   aspirin  (ASPIRIN  81) 81 MG chewable tablet, Chew 1 tablet (81 mg total) by mouth daily., Disp: 30 tablet, Rfl: 0   Atogepant  (QULIPTA ) 30 MG TABS, TAKE 1 TABLET BY MOUTH EVERY MORNING, Disp: 90 tablet, Rfl: 0   Baclofen  5 MG TABS, Take 1 tablet (5 mg total) by mouth 3 (three) times daily as needed (muscleskeletal pain or spasms). Sedating, do not take with other sedating medications and do not take and drive, Disp: 60 tablet, Rfl: 0   benralizumab (FASENRA PEN) 30 MG/ML prefilled autoinjector, Inject into the skin every 30 (thirty) days., Disp: , Rfl:    benzonatate  (TESSALON ) 100 MG capsule, Take 1-2 capsules (100-200 mg total) by mouth 3 (three) times daily as needed for cough., Disp: 40 capsule, Rfl: 0   Blood Glucose Monitoring Suppl (CONTOUR NEXT ONE) KIT, USE TO TEST BLOOD SUGAR ONCE D, Disp: , Rfl:    budesonide -glycopyrrolate -formoterol  (BREZTRI  AEROSPHERE) 160-9-4.8 MCG/ACT AERO inhaler, Inhale 2 puffs into the lungs 2 (two) times daily., Disp: , Rfl:    Cholecalciferol (VITAMIN D ) 2000 units CAPS, Take 1 capsule  (2,000 Units total) by mouth daily., Disp: 30 capsule, Rfl: 0   Continuous Glucose Sensor (FREESTYLE LIBRE 3 PLUS SENSOR) MISC, USE TO MONITOR BLOOD SUGAR AS DIRECTED. CHANGE SENSOR EVERY 15 DAYS., Disp: 6 each, Rfl: 6   cyclobenzaprine  (FLEXERIL ) 5 MG tablet, TAKE 1 TABLET BY MOUTH AT BEDTIME, Disp: 90 tablet, Rfl: 0   diclofenac  Sodium (VOLTAREN ) 1 % GEL, Apply 2 g topically 4 (four) times daily. (Patient taking differently: Apply 2 g topically 4 (four) times daily as needed.), Disp: 100 g, Rfl: 2   diltiazem  (CARDIZEM  CD) 240 MG 24 hr capsule, Take 1 capsule by mouth daily., Disp: , Rfl:    empagliflozin  (JARDIANCE ) 25 MG TABS tablet, TAKE 1 TABLET BY MOUTH DAILY BEFORE BREAKFAST, Disp: 90 tablet, Rfl: 1   EPINEPHrine  0.3 mg/0.3 mL IJ SOAJ injection, Inject into the muscle., Disp: , Rfl:    ezetimibe  (ZETIA ) 10 MG tablet, Take 1 tablet (10 mg total) by mouth daily., Disp: 90 tablet, Rfl: 1   fluticasone  (FLONASE) 50 MCG/ACT nasal spray, 2 sprays each nostril Daily. DISP#  1 bottles = 1 month supply., Disp: , Rfl:    gabapentin  (NEURONTIN ) 300 MG capsule, TAKE 1 CAPSULE BY MOUTH THREE TIMES DAILY, Disp: 270 capsule, Rfl: 1   glucose blood test strip, Use as instructed, Disp: 100 each, Rfl: 12   insulin  NPH Human (NOVOLIN N) 100 UNIT/ML injection, Inject into the skin as needed., Disp: , Rfl:    ipratropium-albuterol  (DUONEB) 0.5-2.5 (3) MG/3ML SOLN, INHALE 1 VIAL VIA NEBULIZER EVERY 6 HOURS AS NEEDED, Disp: 360 mL, Rfl: 1   levocetirizine (XYZAL ) 5 MG tablet, Take 1 tablet (5 mg total) by mouth every evening., Disp: 90 tablet, Rfl: 1   magnesium  oxide (MAG-OX) 400 MG tablet, TAKE 1 TABLET BY MOUTH TWICE DAILY, Disp: 180 tablet, Rfl: 11   metoprolol  succinate (TOPROL -XL) 25 MG 24 hr tablet, Take 1 tablet (25 mg total) by mouth daily., Disp: 30 tablet,  Rfl: 5   Microlet Lancets MISC, USE TO CHECK BLOOD SUGAR ONCE D, Disp: , Rfl:    montelukast  (SINGULAIR ) 10 MG tablet, Take 10 mg by mouth daily.,  Disp: , Rfl:    nystatin  (MYCOSTATIN ) 100000 UNIT/ML suspension, Take 5 mLs (500,000 Units total) by mouth 4 (four) times daily., Disp: 60 mL, Rfl: 2   ofloxacin (OCUFLOX) 0.3 % ophthalmic solution, , Disp: , Rfl:    ondansetron  (ZOFRAN -ODT) 4 MG disintegrating tablet, Take 1 tablet (4 mg total) by mouth every 8 (eight) hours as needed for nausea., Disp: 30 tablet, Rfl: 0   pantoprazole  (PROTONIX ) 40 MG tablet, Take 1 tablet (40 mg total) by mouth 2 (two) times daily., Disp: 90 tablet, Rfl: 1   potassium chloride  SA (KLOR-CON  M) 20 MEQ tablet, Take 1 tablet (20 mEq total) by mouth daily., Disp: 30 tablet, Rfl: 1   prednisoLONE acetate (PRED FORTE) 1 % ophthalmic suspension, SMARTSIG:In Eye(s), Disp: , Rfl:    rosuvastatin  (CRESTOR ) 40 MG tablet, Take 1 tablet (40 mg total) by mouth daily., Disp: 90 tablet, Rfl: 1   Semaglutide ,0.25 or 0.5MG /DOS, (OZEMPIC , 0.25 OR 0.5 MG/DOSE,) 2 MG/3ML SOPN, INJECT 0.5MG  UNDER THE SKIN ONE TIME WEEKLY, Disp: 9 mL, Rfl: 0   Sharps Container (BD SHARPS COLLECTOR) MISC, Use as directed to dispose of Fasenra pen, Disp: , Rfl:    sodium chloride  0.9 % nebulizer solution, Inhale 3 mLs into the lungs 2 (two) times daily., Disp: , Rfl:    Spacer/Aero-Holding Chambers (OPTICHAMBER DIAMOND) MISC, , Disp: , Rfl:    triamcinolone  cream (KENALOG ) 0.1 %, Apply 1 Application topically as needed., Disp: , Rfl:    Ubrogepant  (UBRELVY ) 100 MG TABS, Take 1 tablet (100 mg total) by mouth daily as needed., Disp: 48 tablet, Rfl: 0 [2]  Allergies Allergen Reactions   Ace Inhibitors Swelling    Angioedema    Lisinopril  Swelling    Face and neck swelling   Quetiapine     confusion

## 2025-01-30 ENCOUNTER — Other Ambulatory Visit (HOSPITAL_COMMUNITY): Payer: Self-pay

## 2025-01-30 ENCOUNTER — Telehealth: Payer: Self-pay | Admitting: Pharmacy Technician

## 2025-01-30 NOTE — Telephone Encounter (Signed)
 Pharmacy Patient Advocate Encounter   Received notification from [facility] that prior authorization for Ozempic  (0.25 or 0.5 MG/DOSE) 2MG /3ML pen-injectors is required/requested.   Insurance verification completed.   The patient is insured through [facility].   Per test claim: PA required; PA started via CoverMyMeds. KEY [id number] . Waiting for clinical questions to populate.

## 2025-04-18 ENCOUNTER — Other Ambulatory Visit

## 2025-04-18 ENCOUNTER — Encounter

## 2025-05-23 ENCOUNTER — Ambulatory Visit: Admitting: Family Medicine

## 2025-05-24 ENCOUNTER — Ambulatory Visit
